# Patient Record
Sex: Male | Born: 1940 | Race: Black or African American | Hispanic: No | Marital: Married | Smoking: Former smoker
Health system: Southern US, Community
[De-identification: ages and names within clinical notes are randomized; demographics above are authoritative.]

## PROBLEM LIST (undated history)

## (undated) ENCOUNTER — Emergency Department (HOSPITAL_COMMUNITY): Discharge status: Against Medical Advice | Payer: Medicare Other

## (undated) DIAGNOSIS — F0390 Unspecified dementia without behavioral disturbance: Secondary | ICD-10-CM

## (undated) DIAGNOSIS — I219 Acute myocardial infarction, unspecified: Secondary | ICD-10-CM

## (undated) DIAGNOSIS — N186 End stage renal disease: Secondary | ICD-10-CM

## (undated) DIAGNOSIS — J90 Pleural effusion, not elsewhere classified: Secondary | ICD-10-CM

## (undated) DIAGNOSIS — A419 Sepsis, unspecified organism: Secondary | ICD-10-CM

## (undated) DIAGNOSIS — I5032 Chronic diastolic (congestive) heart failure: Secondary | ICD-10-CM

## (undated) DIAGNOSIS — N289 Disorder of kidney and ureter, unspecified: Secondary | ICD-10-CM

## (undated) DIAGNOSIS — I10 Essential (primary) hypertension: Secondary | ICD-10-CM

## (undated) DIAGNOSIS — I214 Non-ST elevation (NSTEMI) myocardial infarction: Secondary | ICD-10-CM

## (undated) DIAGNOSIS — A401 Sepsis due to streptococcus, group B: Secondary | ICD-10-CM

## (undated) DIAGNOSIS — R410 Disorientation, unspecified: Secondary | ICD-10-CM

## (undated) DIAGNOSIS — D649 Anemia, unspecified: Secondary | ICD-10-CM

## (undated) DIAGNOSIS — E079 Disorder of thyroid, unspecified: Secondary | ICD-10-CM

## (undated) DIAGNOSIS — I350 Nonrheumatic aortic (valve) stenosis: Secondary | ICD-10-CM

## (undated) DIAGNOSIS — M1990 Unspecified osteoarthritis, unspecified site: Secondary | ICD-10-CM

## (undated) DIAGNOSIS — I2510 Atherosclerotic heart disease of native coronary artery without angina pectoris: Secondary | ICD-10-CM

## (undated) DIAGNOSIS — E119 Type 2 diabetes mellitus without complications: Secondary | ICD-10-CM

## (undated) DIAGNOSIS — E785 Hyperlipidemia, unspecified: Secondary | ICD-10-CM

## (undated) DIAGNOSIS — E8809 Other disorders of plasma-protein metabolism, not elsewhere classified: Secondary | ICD-10-CM

## (undated) DIAGNOSIS — I639 Cerebral infarction, unspecified: Secondary | ICD-10-CM

## (undated) HISTORY — DX: Hyperlipidemia, unspecified: E78.5

## (undated) HISTORY — DX: Sepsis due to Streptococcus, group B: A40.1

## (undated) HISTORY — DX: Sepsis, unspecified organism: A41.9

## (undated) HISTORY — DX: Cerebral infarction, unspecified: I63.9

## (undated) HISTORY — DX: Acute myocardial infarction, unspecified: I21.9

## (undated) HISTORY — DX: Disorder of thyroid, unspecified: E07.9

## (undated) NOTE — *Deleted (*Deleted)
Attending Note  Patient seen and discussed with PA Strader, I agree with her documetation. 39 yo male history of CKD 5, HTN, DM2, HL reported admitted with chest pain and SOB. In ER hypoxic and placed on bipap, found to be hypertensive and started on NG drip. Significant signs of fluid overload, seen by nephrology with plans to start HD as inpatient once access is placed. Left thoracentesis with 1 L removed, started on IV diuretics by neprhology. Initital troponin was mild, hoiwever has trended up to 2891. EKG SR Echo showed LVEF 55-60%, severe LVH. EKG incomplete LBBB that is chronic.  Initially on presentation with fluid overload and woresning renal failure thought mild trop at that time was related to HF. WIth significant uptrend as well as long history of exertional chest pain concern for undelrying ischemic heart disease. He is on medical therapy with ASA 81, atorva 40, hep gtt, lopressor 100mg  bid. No ACE/ARB given renal function though could consider once committted to HD. Once access placed and able to had HD this admission would plan for heart cath.  Acute on chronic diastolic HF in setting of advanced kidney failure. We have deferred diuretic dosing to neprhology, currnetly on IV lasix 120mg  bid. Ultimately volume will be controlled by upcoming HD   Severe HTN in setting of fluid overload, per renal want to avoid aggressive bp lowering to allow optimal perfusion of kidneys. Fairly low dose NG gtt, will ask nursing to titrate more so based on SBP than chest pain, goal SBP <140  Would anticipate cath once stabilized on HD and more euvolemic, potentially Monday.    Tacy Learn MD

---

## 1998-11-13 DIAGNOSIS — I219 Acute myocardial infarction, unspecified: Secondary | ICD-10-CM

## 1998-11-13 HISTORY — DX: Acute myocardial infarction, unspecified: I21.9

## 1998-11-13 HISTORY — PX: CARDIAC CATHETERIZATION: SHX172

## 1998-11-29 DIAGNOSIS — I252 Old myocardial infarction: Secondary | ICD-10-CM | POA: Insufficient documentation

## 2001-02-26 LAB — HM COLONOSCOPY

## 2002-11-01 ENCOUNTER — Emergency Department (HOSPITAL_COMMUNITY): Admission: EM | Admit: 2002-11-01 | Discharge: 2002-11-01 | Payer: Self-pay | Admitting: Emergency Medicine

## 2004-07-19 ENCOUNTER — Inpatient Hospital Stay (HOSPITAL_BASED_OUTPATIENT_CLINIC_OR_DEPARTMENT_OTHER): Admission: RE | Admit: 2004-07-19 | Discharge: 2004-07-19 | Payer: Self-pay | Admitting: Cardiology

## 2009-06-23 ENCOUNTER — Ambulatory Visit: Admission: RE | Admit: 2009-06-23 | Discharge: 2009-06-23 | Payer: Self-pay | Admitting: Family Medicine

## 2009-06-26 ENCOUNTER — Ambulatory Visit: Payer: Self-pay | Admitting: Internal Medicine

## 2011-02-08 DIAGNOSIS — I252 Old myocardial infarction: Secondary | ICD-10-CM

## 2011-02-08 DIAGNOSIS — Z550 Illiteracy and low-level literacy: Secondary | ICD-10-CM | POA: Insufficient documentation

## 2011-02-08 DIAGNOSIS — E785 Hyperlipidemia, unspecified: Secondary | ICD-10-CM | POA: Insufficient documentation

## 2011-02-08 DIAGNOSIS — E039 Hypothyroidism, unspecified: Secondary | ICD-10-CM

## 2011-02-08 DIAGNOSIS — I2510 Atherosclerotic heart disease of native coronary artery without angina pectoris: Secondary | ICD-10-CM | POA: Insufficient documentation

## 2011-02-08 DIAGNOSIS — E119 Type 2 diabetes mellitus without complications: Secondary | ICD-10-CM

## 2011-02-08 DIAGNOSIS — N529 Male erectile dysfunction, unspecified: Secondary | ICD-10-CM

## 2011-02-08 DIAGNOSIS — N189 Chronic kidney disease, unspecified: Secondary | ICD-10-CM

## 2011-02-08 DIAGNOSIS — I10 Essential (primary) hypertension: Secondary | ICD-10-CM

## 2011-02-08 DIAGNOSIS — E1129 Type 2 diabetes mellitus with other diabetic kidney complication: Secondary | ICD-10-CM | POA: Insufficient documentation

## 2011-03-31 NOTE — Procedures (Signed)
NAME:  Michael Trevino, Michael Trevino NO.:  [id number]   MEDICAL RECORD NO.:  [id number]          PATIENT TYPE:  OUT   LOCATION:  SLEEP CENTER                 FACILITY:  [facility]   PHYSICIAN:  Clinton D. Annamaria Boots, MD, FCCP, FACPDATE OF BIRTH:  25-Jul-1941   DATE OF STUDY:  06/25/2009                            NOCTURNAL POLYSOMNOGRAM   REFERRING PHYSICIAN:  Chipper Herb, M.D.   INDICATION FOR STUDY:  Hypersomnia with sleep apnea.   EPWORTH SLEEPINESS SCORE:  12/24, BMI 36.6.  Weight 248 pounds, height  69 inches.  Neck 18.5 inches.   MEDICATIONS:  Home medications are charted and reviewed.   SLEEP ARCHITECTURE:  Total sleep time 107.5 minutes with sleep  efficiency 26.3%.  Stage I was 33.5%, stage II 66.5%, stages III and REM  were absent.  Sleep latency 32 minutes, awake after sleep onset 209  minutes, arousal index 59.7 indicating increased EEG arousal.  No  bedtime medication reported.  The patient had difficulty initiating and  maintaining sleep.   RESPIRATORY DATA:  Apnea-hypopnea index (AHI) 47.4 per hour.  A total of  85 events was scored including 1 obstructive apnea and 84 hypopneas.  Events were non supine.  He achieved insufficient sleep to permit CPAP  titration by split protocol on this study night.   OXYGEN DATA:  Extremely loud snoring with oxygen desaturation to a nadir  of 84%.  Mean oxygen saturation through the study was 91.7% on room air.   CARDIAC DATA:  Sinus rhythm.   MOVEMENT-PARASOMNIA:  No significant movement disturbance.  Bathroom x3.   IMPRESSIONS-RECOMMENDATIONS:  1. Nonspecific difficulty initiating and maintaining sleep with      isolated episodes of sleep still marked by frequent sleep stage      changes.  Reference was made to a past history of stroke and      myocardial infarction in the past.  2. Moderately severe obstructive sleep apnea/hypopnea syndrome, apnea-      hypopnea index 47.4 per hour.  Events were while non supine.  Very    loud snoring with oxygen desaturation to a nadir of 84% on room      air.  3. In light of the patient's combined difficulties initiating and      maintaining sleep, with obstructive sleep apnea,      consider return for continuous positive airway pressure titration      bringing a sleep medication to help consolidate nighttime sleep.      Otherwise, evaluate for alternative management as appropriate.      Clinton D. Annamaria Boots, MD, [facility], FACP  Diplomate, Tax adviser of Sleep Medicine  Electronically Signed     CDY/MEDQ  D:  06/25/2009 15:36:57  T:  06/26/2009 00:43:06  Job:  [id number]

## 2011-03-31 NOTE — Cardiovascular Report (Signed)
NAME:  Michael Trevino, Michael Trevino NO.:  [id number]   MEDICAL RECORD NO.:  [id number]                   PATIENT TYPE:  OIB   LOCATION:  6501                                 FACILITY:  [facility]   PHYSICIAN:  Minus Breeding, M.D.                DATE OF BIRTH:  [birth year]   DATE OF PROCEDURE:  07/19/2004  DATE OF DISCHARGE:                              CARDIAC CATHETERIZATION   PRIMARY CARE:  Osa Craver, P.A., [facility].   INDICATIONS:  Evaluate patient with an abnormal Cardiolite suggesting  inferior infarct with ischemia.   PROCEDURE:  Left heart catheterization, selective coronary angiography   The left heart catheterization was performed via the right femoral artery.  The artery was cannulated using anterior wall puncture.  A #4 French  anterior sheath was inserted via the modified Seldinger technique.  Pre-  formed Judkins and a pigtail catheter were utilized.  Note:  The patient ws given several hours of hydration prior to this  procedure because of his renal insufficiency. We limited the dye as well.   RESULTS:  Hemodynamics:  LV 131/8, aorta 124/62.  Coronaries:  Left main was  normal.  The LAD had mild diffuse disease.  There were luminal  irregularities.  The first diagonal was large and normal.  The second  diagonal filled faintly and was either occluded or subtotally occluded  proximally, filling out over bridging collaterals, there was scant antegrade  flow.  Circumflex in the AV groove was normal.  There was a large OM1 which  was normal, an OM2 was large and normal. The right coronary artery was a  dominant vessel.  It was occluded proximally.   LEFT VENTRICULOGRAM:  The left ventriculogram was non-injected secondary to  renal insufficiency.   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   PLAN:  The patient will have medical management with risk  reduction.                                               Minus Breeding, M.D.    JH/MEDQ  D:  07/19/2004  T:  07/19/2004  Job:  [id number]   cc:   Osa Craver, P.A.

## 2011-04-06 ENCOUNTER — Encounter: Payer: Self-pay | Admitting: Physician Assistant

## 2011-04-06 LAB — HEMOGLOBIN A1C: Hgb A1c MFr Bld: 7.7 % — AB (ref 4.0–6.0)

## 2012-05-30 ENCOUNTER — Ambulatory Visit: Payer: Medicare Other | Attending: Family Medicine | Admitting: Physical Therapy

## 2012-05-30 DIAGNOSIS — IMO0001 Reserved for inherently not codable concepts without codable children: Secondary | ICD-10-CM | POA: Insufficient documentation

## 2012-05-30 DIAGNOSIS — R5381 Other malaise: Secondary | ICD-10-CM | POA: Insufficient documentation

## 2012-05-30 DIAGNOSIS — R293 Abnormal posture: Secondary | ICD-10-CM | POA: Insufficient documentation

## 2012-05-30 DIAGNOSIS — M545 Low back pain, unspecified: Secondary | ICD-10-CM | POA: Insufficient documentation

## 2012-06-06 ENCOUNTER — Ambulatory Visit: Payer: Medicare Other | Admitting: Physical Therapy

## 2012-06-13 ENCOUNTER — Ambulatory Visit: Payer: Medicare Other | Attending: Family Medicine | Admitting: Physical Therapy

## 2012-06-13 DIAGNOSIS — R293 Abnormal posture: Secondary | ICD-10-CM | POA: Insufficient documentation

## 2012-06-13 DIAGNOSIS — M545 Low back pain, unspecified: Secondary | ICD-10-CM | POA: Insufficient documentation

## 2012-06-13 DIAGNOSIS — M2569 Stiffness of other specified joint, not elsewhere classified: Secondary | ICD-10-CM | POA: Insufficient documentation

## 2012-06-13 DIAGNOSIS — R5381 Other malaise: Secondary | ICD-10-CM | POA: Insufficient documentation

## 2012-06-13 DIAGNOSIS — IMO0001 Reserved for inherently not codable concepts without codable children: Secondary | ICD-10-CM | POA: Insufficient documentation

## 2012-06-20 ENCOUNTER — Ambulatory Visit: Payer: Medicare Other | Admitting: Physical Therapy

## 2012-06-27 ENCOUNTER — Ambulatory Visit: Payer: Medicare Other | Admitting: *Deleted

## 2012-07-02 ENCOUNTER — Ambulatory Visit: Payer: Medicare Other | Admitting: Physical Therapy

## 2012-07-04 ENCOUNTER — Ambulatory Visit: Payer: Medicare Other | Admitting: Physical Therapy

## 2012-07-09 ENCOUNTER — Ambulatory Visit: Payer: Medicare Other | Admitting: Physical Therapy

## 2012-07-11 ENCOUNTER — Ambulatory Visit: Payer: Medicare Other | Admitting: Physical Therapy

## 2012-07-16 ENCOUNTER — Ambulatory Visit: Payer: Medicare Other | Attending: Family Medicine | Admitting: Physical Therapy

## 2012-07-16 DIAGNOSIS — M545 Low back pain, unspecified: Secondary | ICD-10-CM | POA: Insufficient documentation

## 2012-07-16 DIAGNOSIS — M2569 Stiffness of other specified joint, not elsewhere classified: Secondary | ICD-10-CM | POA: Insufficient documentation

## 2012-07-16 DIAGNOSIS — IMO0001 Reserved for inherently not codable concepts without codable children: Secondary | ICD-10-CM | POA: Insufficient documentation

## 2012-07-16 DIAGNOSIS — R5381 Other malaise: Secondary | ICD-10-CM | POA: Insufficient documentation

## 2012-07-16 DIAGNOSIS — R293 Abnormal posture: Secondary | ICD-10-CM | POA: Insufficient documentation

## 2012-07-18 ENCOUNTER — Ambulatory Visit: Payer: Medicare Other | Admitting: Physical Therapy

## 2012-07-25 ENCOUNTER — Ambulatory Visit: Payer: Medicare Other | Admitting: Physical Therapy

## 2012-11-04 ENCOUNTER — Emergency Department (HOSPITAL_COMMUNITY): Payer: Medicare Other

## 2012-11-04 ENCOUNTER — Encounter (HOSPITAL_COMMUNITY): Payer: Self-pay | Admitting: Emergency Medicine

## 2012-11-04 ENCOUNTER — Observation Stay (HOSPITAL_COMMUNITY)
Admission: EM | Admit: 2012-11-04 | Discharge: 2012-11-06 | Disposition: A | Discharge status: Home / Self Care | Payer: Medicare Other | Attending: Internal Medicine | Admitting: Internal Medicine

## 2012-11-04 DIAGNOSIS — L0390 Cellulitis, unspecified: Secondary | ICD-10-CM | POA: Diagnosis present

## 2012-11-04 DIAGNOSIS — R509 Fever, unspecified: Secondary | ICD-10-CM | POA: Diagnosis present

## 2012-11-04 DIAGNOSIS — IMO0002 Reserved for concepts with insufficient information to code with codable children: Secondary | ICD-10-CM | POA: Diagnosis present

## 2012-11-04 DIAGNOSIS — R5383 Other fatigue: Secondary | ICD-10-CM | POA: Insufficient documentation

## 2012-11-04 DIAGNOSIS — E1129 Type 2 diabetes mellitus with other diabetic kidney complication: Secondary | ICD-10-CM | POA: Diagnosis present

## 2012-11-04 DIAGNOSIS — R21 Rash and other nonspecific skin eruption: Secondary | ICD-10-CM | POA: Diagnosis present

## 2012-11-04 DIAGNOSIS — L03319 Cellulitis of trunk, unspecified: Secondary | ICD-10-CM

## 2012-11-04 DIAGNOSIS — R5381 Other malaise: Secondary | ICD-10-CM | POA: Insufficient documentation

## 2012-11-04 DIAGNOSIS — I2510 Atherosclerotic heart disease of native coronary artery without angina pectoris: Secondary | ICD-10-CM

## 2012-11-04 DIAGNOSIS — I129 Hypertensive chronic kidney disease with stage 1 through stage 4 chronic kidney disease, or unspecified chronic kidney disease: Secondary | ICD-10-CM | POA: Insufficient documentation

## 2012-11-04 DIAGNOSIS — Z9181 History of falling: Secondary | ICD-10-CM | POA: Insufficient documentation

## 2012-11-04 DIAGNOSIS — I10 Essential (primary) hypertension: Secondary | ICD-10-CM

## 2012-11-04 DIAGNOSIS — N189 Chronic kidney disease, unspecified: Secondary | ICD-10-CM

## 2012-11-04 DIAGNOSIS — L02219 Cutaneous abscess of trunk, unspecified: Secondary | ICD-10-CM

## 2012-11-04 DIAGNOSIS — E119 Type 2 diabetes mellitus without complications: Secondary | ICD-10-CM

## 2012-11-04 DIAGNOSIS — L03311 Cellulitis of abdominal wall: Secondary | ICD-10-CM

## 2012-11-04 DIAGNOSIS — N183 Chronic kidney disease, stage 3 unspecified: Secondary | ICD-10-CM | POA: Insufficient documentation

## 2012-11-04 DIAGNOSIS — N289 Disorder of kidney and ureter, unspecified: Secondary | ICD-10-CM

## 2012-11-04 DIAGNOSIS — Z79899 Other long term (current) drug therapy: Secondary | ICD-10-CM | POA: Insufficient documentation

## 2012-11-04 HISTORY — DX: Unspecified osteoarthritis, unspecified site: M19.90

## 2012-11-04 HISTORY — DX: Acute myocardial infarction, unspecified: I21.9

## 2012-11-04 HISTORY — DX: Type 2 diabetes mellitus without complications: E11.9

## 2012-11-04 HISTORY — DX: Essential (primary) hypertension: I10

## 2012-11-04 LAB — CBC WITH DIFFERENTIAL/PLATELET
Basophils Absolute: 0 10*3/uL (ref 0.0–0.1)
Basophils Relative: 0 % (ref 0–1)
Eosinophils Absolute: 0 10*3/uL (ref 0.0–0.7)
Eosinophils Relative: 0 % (ref 0–5)
HCT: 44 % (ref 39.0–52.0)
Hemoglobin: 14.8 g/dL (ref 13.0–17.0)
Lymphocytes Relative: 10 % — ABNORMAL LOW (ref 12–46)
Lymphs Abs: 1 10*3/uL (ref 0.7–4.0)
MCH: 29.2 pg (ref 26.0–34.0)
MCHC: 33.6 g/dL (ref 30.0–36.0)
MCV: 86.8 fL (ref 78.0–100.0)
Monocytes Absolute: 0.7 10*3/uL (ref 0.1–1.0)
Monocytes Relative: 7 % (ref 3–12)
Neutro Abs: 8.4 10*3/uL — ABNORMAL HIGH (ref 1.7–7.7)
Neutrophils Relative %: 83 % — ABNORMAL HIGH (ref 43–77)
Platelets: 135 10*3/uL — ABNORMAL LOW (ref 150–400)
RBC: 5.07 MIL/uL (ref 4.22–5.81)
RDW: 13.9 % (ref 11.5–15.5)
WBC: 10.1 10*3/uL (ref 4.0–10.5)

## 2012-11-04 LAB — COMPREHENSIVE METABOLIC PANEL
ALT: 18 U/L (ref 0–53)
AST: 19 U/L (ref 0–37)
Albumin: 3.5 g/dL (ref 3.5–5.2)
Alkaline Phosphatase: 92 U/L (ref 39–117)
BUN: 18 mg/dL (ref 6–23)
CO2: 22 mEq/L (ref 19–32)
Calcium: 9.1 mg/dL (ref 8.4–10.5)
Chloride: 104 mEq/L (ref 96–112)
Creatinine, Ser: 1.52 mg/dL — ABNORMAL HIGH (ref 0.50–1.35)
GFR calc Af Amer: 51 mL/min — ABNORMAL LOW (ref >90)
GFR calc non Af Amer: 44 mL/min — ABNORMAL LOW (ref >90)
Glucose, Bld: 215 mg/dL — ABNORMAL HIGH (ref 70–99)
Potassium: 3.6 mEq/L (ref 3.5–5.1)
Sodium: 139 mEq/L (ref 135–145)
Total Bilirubin: 0.5 mg/dL (ref 0.3–1.2)
Total Protein: 7.5 g/dL (ref 6.0–8.3)

## 2012-11-04 LAB — URINALYSIS, ROUTINE W REFLEX MICROSCOPIC
Bilirubin Urine: NEGATIVE
Glucose, UA: >1000 mg/dL — AB
Ketones, ur: 15 mg/dL — AB
Leukocytes, UA: NEGATIVE
Nitrite: NEGATIVE
Protein, ur: >300 mg/dL — AB
Specific Gravity, Urine: 1.026 (ref 1.005–1.030)
Urobilinogen, UA: 0.2 mg/dL (ref 0.0–1.0)
pH: 5.5 (ref 5.0–8.0)

## 2012-11-04 LAB — TROPONIN I: Troponin I: <0.3 ng/mL (ref <0.30)

## 2012-11-04 LAB — CK: Total CK: 165 U/L (ref 7–232)

## 2012-11-04 LAB — URINE MICROSCOPIC-ADD ON

## 2012-11-04 LAB — GLUCOSE, CAPILLARY: Glucose-Capillary: 204 mg/dL — ABNORMAL HIGH (ref 70–99)

## 2012-11-04 LAB — CG4 I-STAT (LACTIC ACID): Lactic Acid, Venous: 2.41 mmol/L — ABNORMAL HIGH (ref 0.5–2.2)

## 2012-11-04 MED ORDER — BENAZEPRIL HCL 40 MG PO TABS
40.0000 mg | ORAL_TABLET | Freq: Every day | ORAL | Status: DC
  Administered 2012-11-05 – 2012-11-06 (×2): 40 mg via ORAL
  Filled 2012-11-04 (×2): qty 1

## 2012-11-04 MED ORDER — INSULIN ASPART 100 UNIT/ML ~~LOC~~ SOLN
0.0000 [IU] | Freq: Three times a day (TID) | SUBCUTANEOUS | Status: DC
  Administered 2012-11-05 – 2012-11-06 (×4): 3 [IU] via SUBCUTANEOUS

## 2012-11-04 MED ORDER — CEFAZOLIN SODIUM 1-5 GM-% IV SOLN
1.0000 g | Freq: Once | INTRAVENOUS | Status: AC
  Administered 2012-11-04: 1 g via INTRAVENOUS
  Filled 2012-11-04: qty 50

## 2012-11-04 MED ORDER — INSULIN GLARGINE 100 UNIT/ML ~~LOC~~ SOLN
40.0000 [IU] | Freq: Every day | SUBCUTANEOUS | Status: DC
  Administered 2012-11-05 – 2012-11-06 (×2): 40 [IU] via SUBCUTANEOUS

## 2012-11-04 MED ORDER — SODIUM CHLORIDE 0.9 % IV SOLN
Freq: Once | INTRAVENOUS | Status: AC
  Administered 2012-11-04: 21:00:00 via INTRAVENOUS

## 2012-11-04 MED ORDER — CEFAZOLIN SODIUM 1-5 GM-% IV SOLN
1.0000 g | Freq: Three times a day (TID) | INTRAVENOUS | Status: DC
  Administered 2012-11-05 – 2012-11-06 (×5): 1 g via INTRAVENOUS
  Filled 2012-11-04 (×7): qty 50

## 2012-11-04 MED ORDER — VITAMIN E 180 MG (400 UNIT) PO CAPS
400.0000 [IU] | ORAL_CAPSULE | Freq: Every day | ORAL | Status: DC
  Administered 2012-11-05 – 2012-11-06 (×2): 400 [IU] via ORAL
  Filled 2012-11-04 (×2): qty 1

## 2012-11-04 MED ORDER — NIACIN ER 500 MG PO CPCR
1000.0000 mg | ORAL_CAPSULE | Freq: Every day | ORAL | Status: DC
  Administered 2012-11-05: 1000 mg via ORAL
  Filled 2012-11-04 (×3): qty 2

## 2012-11-04 MED ORDER — FUROSEMIDE 40 MG PO TABS
40.0000 mg | ORAL_TABLET | Freq: Two times a day (BID) | ORAL | Status: DC
  Filled 2012-11-04 (×3): qty 1

## 2012-11-04 MED ORDER — AMLODIPINE BESYLATE 10 MG PO TABS
10.0000 mg | ORAL_TABLET | Freq: Every day | ORAL | Status: DC
  Administered 2012-11-05 – 2012-11-06 (×2): 10 mg via ORAL
  Filled 2012-11-04 (×2): qty 1

## 2012-11-04 MED ORDER — HEPARIN SODIUM (PORCINE) 5000 UNIT/ML IJ SOLN
5000.0000 [IU] | Freq: Three times a day (TID) | INTRAMUSCULAR | Status: DC
  Administered 2012-11-05 – 2012-11-06 (×5): 5000 [IU] via SUBCUTANEOUS
  Filled 2012-11-04 (×7): qty 1

## 2012-11-04 MED ORDER — METOPROLOL TARTRATE 100 MG PO TABS
100.0000 mg | ORAL_TABLET | Freq: Two times a day (BID) | ORAL | Status: DC
  Administered 2012-11-05 – 2012-11-06 (×3): 100 mg via ORAL
  Filled 2012-11-04 (×5): qty 1

## 2012-11-04 MED ORDER — VITAMIN C 500 MG PO TABS
500.0000 mg | ORAL_TABLET | Freq: Every day | ORAL | Status: DC
  Administered 2012-11-05 – 2012-11-06 (×2): 500 mg via ORAL
  Filled 2012-11-04 (×2): qty 1

## 2012-11-04 MED ORDER — ASPIRIN 81 MG PO CHEW
81.0000 mg | CHEWABLE_TABLET | Freq: Every day | ORAL | Status: DC
  Administered 2012-11-05 – 2012-11-06 (×2): 81 mg via ORAL
  Filled 2012-11-04 (×2): qty 1

## 2012-11-04 MED ORDER — LEVOTHYROXINE SODIUM 100 MCG PO TABS
100.0000 ug | ORAL_TABLET | Freq: Every day | ORAL | Status: DC
  Administered 2012-11-05 – 2012-11-06 (×2): 100 ug via ORAL
  Filled 2012-11-04 (×3): qty 1

## 2012-11-04 MED ORDER — SODIUM CHLORIDE 0.9 % IV BOLUS (SEPSIS)
1000.0000 mL | Freq: Once | INTRAVENOUS | Status: AC
  Administered 2012-11-04: 1000 mL via INTRAVENOUS

## 2012-11-04 MED ORDER — ROSUVASTATIN CALCIUM 20 MG PO TABS
20.0000 mg | ORAL_TABLET | Freq: Every day | ORAL | Status: DC
  Administered 2012-11-05: 20 mg via ORAL
  Filled 2012-11-04 (×2): qty 1

## 2012-11-04 NOTE — ED Notes (Signed)
Attempted to call report. Floor RN unable to accept report.  

## 2012-11-04 NOTE — ED Notes (Signed)
Pt's CBG 204 RN notified

## 2012-11-04 NOTE — H&P (Signed)
[facility] History and Physical  Michael Trevino [id number] DOB: [birth year] DOA: 11/04/2012  Referring physician: ED PCP: Redge Gainer, MD  Specialists: None  Chief Complaint: Weakness, fall  HPI: Michael Trevino is a 71 y.o. male who presents with c/o fever, chills, generalized weakness, and a fall earlier this evening.  Symptoms onset earlier today, and are associated with an erythematous rash on his abdomen.  Of note he takes his daily Lantus shots in his abdomen area.  The rash is non painful and spans across his abdomen, he is unsure wether the rash was present before the fall or not.  In the ED he was noted to be running a temperature of 100.3, started on ancef for his cellulitis, hospitalist has been asked to admit.  Review of Systems: 12 systems reviewed and negative.  Past Medical History  Diagnosis Date  . Arthritis   . Diabetes mellitus without complication   . Hypertension   . MI (myocardial infarction) 2000   History reviewed. No pertinent past surgical history. Social History:  does not have a smoking history on file. He does not have any smokeless tobacco history on file. His alcohol and drug histories not on file.  Allergies  Allergen Reactions  . Zocor (Simvastatin - High Dose)     Unknown    History reviewed. No pertinent family history.   Prior to Admission medications   Medication Sig Start Date End Date Taking? Authorizing Provider  amLODipine (NORVASC) 10 MG tablet Take 10 mg by mouth daily.     Yes Historical Provider, MD  ascorbic acid (VITAMIN C) 500 MG tablet Take 500 mg by mouth daily.   Yes Historical Provider, MD  aspirin 81 MG tablet Take 81 mg by mouth daily.     Yes Historical Provider, MD  benazepril (LOTENSIN) 40 MG tablet Take 40 mg by mouth daily.     Yes Historical Provider, MD  furosemide (LASIX) 40 MG tablet Take 40 mg by mouth 2 (two) times daily.     Yes Historical Provider, MD  glimepiride (AMARYL) 4 MG tablet Take 4 mg by mouth  2 (two) times daily.     Yes Historical Provider, MD  insulin glargine (LANTUS) 100 UNIT/ML injection Inject 40 Units into the skin daily.     Yes Historical Provider, MD  levothyroxine (SYNTHROID, LEVOTHROID) 100 MCG tablet Take 100 mcg by mouth daily.     Yes Historical Provider, MD  metoprolol (LOPRESSOR) 100 MG tablet Take 100 mg by mouth 2 (two) times daily.     Yes Historical Provider, MD  niacin (NIASPAN) 1000 MG CR tablet Take 1,000 mg by mouth at bedtime.     Yes Historical Provider, MD  pioglitazone (ACTOS) 30 MG tablet Take 30 mg by mouth daily.     Yes Historical Provider, MD  rosuvastatin (CRESTOR) 20 MG tablet Take 20 mg by mouth at bedtime.     Yes Historical Provider, MD  vitamin E 400 UNIT capsule Take 400 Units by mouth daily.   Yes Historical Provider, MD   Physical Exam: Filed Vitals:   11/04/12 1805 11/04/12 1815 11/04/12 1830 11/04/12 2329  BP: 127/58 134/52 127/58 121/59  Pulse: 101 99 101 96  Temp: 99.5 F (37.5 C)   100.3 F (37.9 C)  TempSrc: Oral   Oral  Resp: 19 23 22 31   SpO2: 93% 97% 96% 95%    General:  NAD, resting comfortably in bed Eyes: PEERLA EOMI ENT: mucous membranes moist Neck: supple w/o JVD  Cardiovascular: RRR w/o MRG Respiratory: CTA B Abdomen: soft, nt, nd, bs+ Skin: erythematous rash across his abdomen with areas of induration Musculoskeletal: MAE, full ROM all 4 extremities Psychiatric: normal tone and affect Neurologic: AAOx3, grossly non-focal  Labs on Admission:  Basic Metabolic Panel:  Lab 0000000 1818  NA 139  K 3.6  CL 104  CO2 22  GLUCOSE 215*  BUN 18  CREATININE 1.52*  CALCIUM 9.1  MG --  PHOS --   Liver Function Tests:  Lab 11/04/12 1818  AST 19  ALT 18  ALKPHOS 92  BILITOT 0.5  PROT 7.5  ALBUMIN 3.5   No results found for this basename: LIPASE:5,AMYLASE:5 in the last 168 hours No results found for this basename: AMMONIA:5 in the last 168 hours CBC:  Lab 11/04/12 1818  WBC 10.1  NEUTROABS 8.4*   HGB 14.8  HCT 44.0  MCV 86.8  PLT 135*   Cardiac Enzymes:  Lab 11/04/12 1913 11/04/12 1824  CKTOTAL 165 --  CKMB -- --  CKMBINDEX -- --  TROPONINI -- <0.30    BNP (last 3 results) No results found for this basename: PROBNP:3 in the last 8760 hours CBG:  Lab 11/04/12 2126  GLUCAP 204*    Radiological Exams on Admission: Dg Chest Portable 1 View  11/04/2012  *RADIOLOGY REPORT*  Clinical Data: Fall with weakness.  History of   hypertension.  PORTABLE CHEST - 1 VIEW  Comparison: None.  Findings: The heart is enlarged.  Aorta calcified and tortuous.  No infiltrates or failure.  No effusion or pneumothorax.  Osseous structures unremarkable.  IMPRESSION: Cardiomegaly.  No active infiltrates or failure.   Original Report Authenticated By: Rolla Flatten, M.D.     EKG: Independently reviewed.  Assessment/Plan Principal Problem:  *Cellulitis Active Problems:  DM (diabetes mellitus)   1. Cellulitis - rash across abdomen suspicious for cellulitis given areas of induration and fact that he uses insulin injections in that exact area on a daily basis.  Will admit to obs given the weakness and fall, treat with ancef for now. 2. DM2 - continue home lantus, holding PO meds and will put him on SSI while here. 3. HTN - continue home meds.    Code Status: Full Code (must indicate code status--if unknown or must be presumed, indicate so) Family Communication: spoke with son and wife at bedside (indicate person spoken with, if applicable, with phone number if by telephone) Disposition Plan: Admit to obs (indicate anticipated LOS)  Time spent: 50 min  Mozes Sagar M. [facility] Pager [phone]  If 7PM-7AM, please contact night-coverage [email or web address] Password [facility] 11/04/2012, 11:49 PM

## 2012-11-04 NOTE — ED Notes (Addendum)
Patient here for fall that his neighbor found him down around 2pm. He c/o weaknes, dizziness, no energy. Reports he wet himself x3 today because he does not have energy to go bathroom. Pt thinks he has pin nerve because his left leg go numbs sometimes. Vital per EMS BP 132/87, pulse 103, RR 16, O2 96% on room air.

## 2012-11-04 NOTE — ED Provider Notes (Signed)
History     CSN: [id number]  Arrival date & time 11/04/12  60   First MD Initiated Contact with Patient 11/04/12 1814      Chief Complaint  Patient presents with  . Fall  . Weakness    (Consider location/radiation/quality/duration/timing/severity/associated sxs/prior treatment) Patient is a 71 y.o. male presenting with fall and weakness. The history is provided by the patient.  Fall  Weakness  Additional symptoms include weakness.  He says that he felt fine yesterday. Today, he went into the bathroom and fell and could not get back up. He lay on the floor for several hours and then she urinated on the floor because he could not get up to get to the commode. He denies any pain anywhere. He denies chest pain, abdominal pain. He denies nausea or vomiting or diarrhea. He did not have any fecal incontinence. There's been no fever, chills, sweats. He came in by ambulance.  Past Medical History  Diagnosis Date  . Arthritis   . Diabetes mellitus without complication   . Hypertension   . MI (myocardial infarction) 2000    No past surgical history on file.  No family history on file.  History  Substance Use Topics  . Smoking status: Not on file  . Smokeless tobacco: Not on file  . Alcohol Use:       Review of Systems  Neurological: Positive for weakness.  All other systems reviewed and are negative.    Allergies  Zocor  Home Medications   Current Outpatient Rx  Name  Route  Sig  Dispense  Refill  . AMLODIPINE BESYLATE 10 MG PO TABS   Oral   Take 10 mg by mouth daily.           . ASPIRIN 81 MG PO TABS   Oral   Take 81 mg by mouth daily.           Marland Kitchen BENAZEPRIL HCL 40 MG PO TABS   Oral   Take 40 mg by mouth daily.           Marland Kitchen EZETIMIBE 10 MG PO TABS   Oral   Take 10 mg by mouth daily.           . FUROSEMIDE 40 MG PO TABS   Oral   Take 40 mg by mouth 2 (two) times daily.           Marland Kitchen GLIMEPIRIDE 4 MG PO TABS   Oral   Take 4 mg by mouth 2  (two) times daily.           . INSULIN GLARGINE 100 UNIT/ML  SOLN   Subcutaneous   Inject 40 Units into the skin daily.           Marland Kitchen LEVOTHYROXINE SODIUM 100 MCG PO TABS   Oral   Take 100 mcg by mouth daily.           Marland Kitchen METOPROLOL TARTRATE 100 MG PO TABS   Oral   Take 100 mg by mouth 2 (two) times daily.           Marland Kitchen NIACIN ER (ANTIHYPERLIPIDEMIC) 1000 MG PO TBCR   Oral   Take 1,000 mg by mouth at bedtime.           Marland Kitchen PIOGLITAZONE HCL 30 MG PO TABS   Oral   Take 30 mg by mouth daily.           Marland Kitchen ROSUVASTATIN CALCIUM 20 MG PO TABS  Oral   Take 20 mg by mouth at bedtime.             BP 127/58  Pulse 101  Temp 99.5 F (37.5 C) (Oral)  Resp 19  SpO2 93%  Physical Exam  Nursing note and vitals reviewed. 71 year old male, resting comfortably and in no acute distress. Vital signs are significant for borderline tachycardia with heart rate of 101. Oxygen saturation is 93%, which is normal. Head is normocephalic and atraumatic. PERRLA, EOMI. Oropharynx is clear. Neck is nontender and supple without adenopathy or JVD. Back is nontender and there is no CVA tenderness. Lungs are clear without rales, wheezes, or rhonchi. Chest is nontender. Heart has regular rate and rhythm without murmur. Abdomen is soft, flat, nontender without masses or hepatosplenomegaly and peristalsis is normoactive. Extremities have no cyanosis, full range of motion is present. Trace edema is present. Skin shows an area of erythema over the abdomen. This area is not tender, but it is slightly warm to the touch and is suspicious for either early cellulitis, or possibly erysipelas.. Neurologic: Mental status is normal, cranial nerves are intact, there are no motor or sensory deficits.    ED Course  Procedures (including critical care time)   Labs Reviewed  CBC WITH DIFFERENTIAL  COMPREHENSIVE METABOLIC PANEL  TROPONIN I  URINALYSIS, ROUTINE W REFLEX MICROSCOPIC   No results  found.   Date: 11/04/2012  Rate: 102  Rhythm: sinus tachycardia  QRS Axis: left  Intervals: normal  ST/T Wave abnormalities: nonspecific T wave changes  Conduction Disutrbances:left anterior fascicular block and Incomplete right bundle-branch block  Narrative Interpretation: Left atrial hypertrophy, left ventricular hypertrophy, incomplete right bundle-branch block, left anterior fascicular block, repolarization changes secondary to LVH. No prior ECG available for comparison.  Old EKG Reviewed: none available    1. Cellulitis of abdominal wall   2. Renal insufficiency       MDM  Severe weakness which most likely is related to apparent area of cellulitis of his abdomen. It is also noted that he is on a statin so he could have rhabdomyolysis related to the statin. Laboratory workup has been initiated, he'll be given IV hydration and CK level checked and he will be given a dose of Ancef.  Workup is remarkable for mildly elevated creatinine of 1.5, and borderline elevated lactic acid of 2.4. CK is come back normal. WBC is normal, but with a left shift. Case is been discussed with Dr. Alcario Drought of [facility] who agrees to admit the patient.     Delora Fuel, MD [phone]

## 2012-11-05 ENCOUNTER — Encounter (HOSPITAL_COMMUNITY): Payer: Self-pay

## 2012-11-05 DIAGNOSIS — N189 Chronic kidney disease, unspecified: Secondary | ICD-10-CM

## 2012-11-05 DIAGNOSIS — R21 Rash and other nonspecific skin eruption: Principal | ICD-10-CM

## 2012-11-05 DIAGNOSIS — L0390 Cellulitis, unspecified: Secondary | ICD-10-CM | POA: Diagnosis present

## 2012-11-05 DIAGNOSIS — E119 Type 2 diabetes mellitus without complications: Secondary | ICD-10-CM

## 2012-11-05 DIAGNOSIS — R509 Fever, unspecified: Secondary | ICD-10-CM

## 2012-11-05 LAB — CBC
HCT: 37.5 % — ABNORMAL LOW (ref 39.0–52.0)
Hemoglobin: 13.1 g/dL (ref 13.0–17.0)
MCH: 30 pg (ref 26.0–34.0)
MCHC: 34.9 g/dL (ref 30.0–36.0)
MCV: 85.8 fL (ref 78.0–100.0)
Platelets: 128 10*3/uL — ABNORMAL LOW (ref 150–400)
RBC: 4.37 MIL/uL (ref 4.22–5.81)
RDW: 14.1 % (ref 11.5–15.5)
WBC: 12.5 10*3/uL — ABNORMAL HIGH (ref 4.0–10.5)

## 2012-11-05 LAB — GLUCOSE, CAPILLARY
Glucose-Capillary: 174 mg/dL — ABNORMAL HIGH (ref 70–99)
Glucose-Capillary: 183 mg/dL — ABNORMAL HIGH (ref 70–99)
Glucose-Capillary: 193 mg/dL — ABNORMAL HIGH (ref 70–99)
Glucose-Capillary: 208 mg/dL — ABNORMAL HIGH (ref 70–99)
Glucose-Capillary: 219 mg/dL — ABNORMAL HIGH (ref 70–99)

## 2012-11-05 LAB — BASIC METABOLIC PANEL
BUN: 21 mg/dL (ref 6–23)
CO2: 22 mEq/L (ref 19–32)
Calcium: 8.6 mg/dL (ref 8.4–10.5)
Chloride: 107 mEq/L (ref 96–112)
Creatinine, Ser: 1.67 mg/dL — ABNORMAL HIGH (ref 0.50–1.35)
GFR calc Af Amer: 46 mL/min — ABNORMAL LOW (ref >90)
GFR calc non Af Amer: 40 mL/min — ABNORMAL LOW (ref >90)
Glucose, Bld: 190 mg/dL — ABNORMAL HIGH (ref 70–99)
Potassium: 3.4 mEq/L — ABNORMAL LOW (ref 3.5–5.1)
Sodium: 139 mEq/L (ref 135–145)

## 2012-11-05 LAB — HEMOGLOBIN A1C
Hgb A1c MFr Bld: 10.4 % — ABNORMAL HIGH (ref <5.7)
Mean Plasma Glucose: 252 mg/dL — ABNORMAL HIGH (ref <117)

## 2012-11-05 MED ORDER — SODIUM CHLORIDE 0.9 % IV SOLN
Freq: Once | INTRAVENOUS | Status: AC
  Administered 2012-11-05: 200 mL via INTRAVENOUS

## 2012-11-05 MED ORDER — ONDANSETRON HCL 4 MG/2ML IJ SOLN: 4.0000 mg | Freq: Three times a day (TID) | INTRAMUSCULAR | Status: AC | PRN

## 2012-11-05 NOTE — Progress Notes (Signed)
Utilization review completed.  

## 2012-11-05 NOTE — Progress Notes (Addendum)
[facility]             Progress Note   Subjective: starting to feel better, fever, chills down  Objective: Vital signs in last 24 hours: Temp:  [98.3 F (36.8 C)-99.2 F (37.3 C)] 99 F (37.2 C) (12/24 2151) Pulse Rate:  [75-91] 87  (12/24 2151) Resp:  [20-28] 22  (12/24 2151) BP: (117-140)/(52-64) 140/64 mmHg (12/24 2151) SpO2:  [95 %-98 %] 95 % (12/24 2151) Weight:  [113.1 kg (249 lb 5.4 oz)] 113.1 kg (249 lb 5.4 oz) (12/24 2151) Weight change: 1.2 kg (2 lb 10.3 oz) Last BM Date: 11/04/12  Intake/Output from previous day: 12/24 0701 - 12/25 0700 In: 600 [P.O.:600] Out: 322 [Urine:322] Total I/O In: -  Out: 200 [Urine:200]   Physical Exam: General: Alert, awake, oriented x3, in no acute distress. HEENT: No bruits, no goiter. Heart: Regular rate and rhythm, without murmurs, rubs, gallops. Lungs: Clear to auscultation bilaterally. Abdomen: Soft, nontender, nondistended, positive bowel sounds, erythematous rash involving lower abdomen and back. Extremities: No clubbing cyanosis or edema with positive pedal pulses. Neuro: Grossly intact, nonfocal.    Lab Results: Basic Metabolic Panel:  Basename 11/05/12 0500 11/04/12 1818  NA 139 139  K 3.4* 3.6  CL 107 104  CO2 22 22  GLUCOSE 190* 215*  BUN 21 18  CREATININE 1.67* 1.52*  CALCIUM 8.6 9.1  MG -- --  PHOS -- --   Liver Function Tests:  [facility] 11/04/12 1818  AST 19  ALT 18  ALKPHOS 92  BILITOT 0.5  PROT 7.5  ALBUMIN 3.5   No results found for this basename: LIPASE:2,AMYLASE:2 in the last 72 hours No results found for this basename: AMMONIA:2 in the last 72 hours CBC:  Basename 11/05/12 0500 11/04/12 1818  WBC 12.5* 10.1  NEUTROABS -- 8.4*  HGB 13.1 14.8  HCT 37.5* 44.0  MCV 85.8 86.8  PLT 128* 135*   Cardiac Enzymes:  Basename 11/04/12 1913 11/04/12 1824  CKTOTAL 165 --  CKMB -- --  CKMBINDEX -- --  TROPONINI -- <0.30   BNP: No results found for this basename:  PROBNP:3 in the last 72 hours D-Dimer: No results found for this basename: DDIMER:2 in the last 72 hours CBG:  Basename 11/05/12 2156 11/05/12 1612 11/05/12 1142 11/05/12 0813 11/05/12 0013 11/04/12 2126  GLUCAP 208* 183* 193* 174* 219* 204*   Hemoglobin A1C:  Basename 11/05/12 0500  HGBA1C 10.4*   Fasting Lipid Panel: No results found for this basename: CHOL,HDL,LDLCALC,TRIG,CHOLHDL,LDLDIRECT in the last 72 hours Thyroid Function Tests: No results found for this basename: TSH,T4TOTAL,FREET4,T3FREE,THYROIDAB in the last 72 hours Anemia Panel: No results found for this basename: VITAMINB12,FOLATE,FERRITIN,TIBC,IRON,RETICCTPCT in the last 72 hours Coagulation: No results found for this basename: LABPROT:2,INR:2 in the last 72 hours Urine Drug Screen: Drugs of Abuse  No results found for this basename: labopia, cocainscrnur, labbenz, amphetmu, thcu, labbarb    Alcohol Level: No results found for this basename: ETH:2 in the last 72 hours Urinalysis:  Basename 11/04/12 2134  COLORURINE YELLOW  LABSPEC 1.026  PHURINE 5.5  GLUCOSEU >1000*  HGBUR SMALL*  BILIRUBINUR NEGATIVE  KETONESUR 15*  PROTEINUR >300*  UROBILINOGEN 0.2  NITRITE NEGATIVE  LEUKOCYTESUR NEGATIVE    No results found for this or any previous visit (from the past 240 hour(s)).  Studies/Results: Dg Chest Portable 1 View  11/04/2012  *RADIOLOGY REPORT*  Clinical Data: Fall with weakness.  History of   hypertension.  PORTABLE CHEST - 1 VIEW  Comparison:  None.  Findings: The heart is enlarged.  Aorta calcified and tortuous.  No infiltrates or failure.  No effusion or pneumothorax.  Osseous structures unremarkable.  IMPRESSION: Cardiomegaly.  No active infiltrates or failure.   Original Report Authenticated By: Rolla Flatten, M.D.     Medications: Scheduled Meds:   . amLODipine  10 mg Oral Daily  . aspirin  81 mg Oral Daily  . benazepril  40 mg Oral Daily  .  ceFAZolin (ANCEF) IV  1 g Intravenous Q8H  .  heparin  5,000 Units Subcutaneous Q8H  . insulin aspart  0-15 Units Subcutaneous TID WC  . insulin glargine  40 Units Subcutaneous Daily  . levothyroxine  100 mcg Oral QAC breakfast  . metoprolol  100 mg Oral BID  . niacin  1,000 mg Oral QHS  . rosuvastatin  20 mg Oral q1800  . ascorbic acid  500 mg Oral Daily  . vitamin E  400 Units Oral Daily   Continuous Infusions:  PRN Meds:.  Assessment/Plan: 1. Fever, chills and erythematous rash involving abd and back Suspect Viral illness Was started on IV ancef by admitter this am, Will continue this today but clinically exam not consistent with cellulitis, could stop in am if remains stable Continue IVF  2. DM: continue lantus, SSI  3. HTN: stable, continue home meds  4. CKD: creatinine close to baseline  DVT proph: heparin SQ  Home tomorrow if stable Time spent coordinating care: 93min   LOS: 2 days   [facility] Pager: [phone] 11/06/2012, 12:20 AM

## 2012-11-06 DIAGNOSIS — R509 Fever, unspecified: Secondary | ICD-10-CM | POA: Diagnosis present

## 2012-11-06 DIAGNOSIS — R21 Rash and other nonspecific skin eruption: Secondary | ICD-10-CM | POA: Diagnosis present

## 2012-11-06 DIAGNOSIS — I2510 Atherosclerotic heart disease of native coronary artery without angina pectoris: Secondary | ICD-10-CM

## 2012-11-06 LAB — BASIC METABOLIC PANEL
BUN: 20 mg/dL (ref 6–23)
CO2: 23 mEq/L (ref 19–32)
Calcium: 8.3 mg/dL — ABNORMAL LOW (ref 8.4–10.5)
Chloride: 105 mEq/L (ref 96–112)
Creatinine, Ser: 1.54 mg/dL — ABNORMAL HIGH (ref 0.50–1.35)
GFR calc Af Amer: 51 mL/min — ABNORMAL LOW (ref >90)
GFR calc non Af Amer: 44 mL/min — ABNORMAL LOW (ref >90)
Glucose, Bld: 165 mg/dL — ABNORMAL HIGH (ref 70–99)
Potassium: 3.5 mEq/L (ref 3.5–5.1)
Sodium: 139 mEq/L (ref 135–145)

## 2012-11-06 LAB — CBC
HCT: 36.4 % — ABNORMAL LOW (ref 39.0–52.0)
Hemoglobin: 12.2 g/dL — ABNORMAL LOW (ref 13.0–17.0)
MCH: 29 pg (ref 26.0–34.0)
MCHC: 33.5 g/dL (ref 30.0–36.0)
MCV: 86.5 fL (ref 78.0–100.0)
Platelets: 132 10*3/uL — ABNORMAL LOW (ref 150–400)
RBC: 4.21 MIL/uL — ABNORMAL LOW (ref 4.22–5.81)
RDW: 14.4 % (ref 11.5–15.5)
WBC: 8.4 10*3/uL (ref 4.0–10.5)

## 2012-11-06 LAB — GLUCOSE, CAPILLARY: Glucose-Capillary: 187 mg/dL — ABNORMAL HIGH (ref 70–99)

## 2012-11-06 NOTE — Progress Notes (Signed)
AVS reviewed with pt at bedside; teach back method used. Pt verbalized understanding of AVS and had no questions. IV removed. Pt remains stable. Pt to be transported via wheelchair to exit of facility. [place], Franky Macho

## 2012-11-06 NOTE — Progress Notes (Signed)
Nurse tech reported that pt got 3rd finger of right hand caught in the side of wheelchair as he was hurriedly sitting down. On assessment, top layer of skin torn on top of 3rd finger just below the nail. Area cleaned and dry dressing applied. Pt said he did not want to stay, he was going home. Educated pt on keep area clean and covered until it healed. Pt verbalized understanding. Family here to take pt home. Pt taken down in wheelchair to exit of facility. [place], Franky Macho

## 2012-11-07 NOTE — Discharge Summary (Addendum)
Physician Discharge Summary  Michael Trevino [id number] DOB: Sep 12, 1941 DOA: 11/04/2012  PCP: Redge Gainer, MD  Admit date: 11/04/2012 Discharge date: 11/07/2012  Time spent: 40 minutes  Recommendations for Outpatient Follow-up:  1. Followup with primary care physician within one week  Discharge Diagnoses:  Principal Problem:  Rash Active Problems:  DM (diabetes mellitus)  Fever CKD stage III  Discharge Condition: Stable  Diet recommendation: Regular  Filed Weights   11/05/12 0005 11/05/12 2151  Weight: 111.9 kg (246 lb 11.1 oz) 113.1 kg (249 lb 5.4 oz)    History of present illness:  Michael Trevino is a 71 y.o. male who presents with c/o fever, chills, generalized weakness, and a fall earlier this evening. Symptoms onset earlier today, and are associated with an erythematous rash on his abdomen. Of note he takes his daily Lantus shots in his abdomen area. The rash is non painful and spans across his abdomen, he is unsure wether the rash was present before the fall or not.  In the ED he was noted to be running a temperature of 100.3, started on ancef for his cellulitis, hospitalist has been asked to admit.   Hospital Course:   1. Fever: With chills and erythematous rash involving the abdomen and the back, this was initially thought to be secondary to cellulitis, this does not look clinically like cellulitis. Patient was started on Ancef empirically on the emergency department as were discontinued, patient treated conservatively with Tylenol and IV fluids. I think this is viral transient infection. He did well without any problems he wanted to go back home so he was discharged home.  2. Rash: Erythematous macular rash involving his abdomen and back, it was thought initially to be cellulitis, it does not look like cellulitis as mentioned above. Looks to me like a transient viral illness. Patient should followup with his primary care physician for further evaluation.  3. Diabetes  mellitus type 2: His home medications including Lantus insulin were continued throughout the hospital so that I changes.  4. CKD stage III: Baseline creatinine in the EMR 1.5, patient was present with creatinine 1.6 and was back to his baseline of the day of discharge.  Procedures:  None  Consultations:  None  Discharge Exam: Filed Vitals:   11/05/12 1735 11/05/12 2151 11/06/12 0550 11/06/12 0800  BP: 119/52 140/64 133/66 152/78  Pulse: 75 87 76 80  Temp: 98.3 F (36.8 C) 99 F (37.2 C) 99.1 F (37.3 C) 97.8 F (36.6 C)  TempSrc: Oral Oral Oral Oral  Resp: 20 22 22 22   Height:      Weight:  113.1 kg (249 lb 5.4 oz)    SpO2: 98% 95% 92% 95%   General: Alert and awake, oriented x3, not in any acute distress. HEENT: anicteric sclera, pupils reactive to light and accommodation, EOMI CVS: S1-S2 clear, no murmur rubs or gallops Chest: clear to auscultation bilaterally, no wheezing, rales or rhonchi Abdomen: soft nontender, nondistended, normal bowel sounds, no organomegaly Extremities: no cyanosis, clubbing or edema noted bilaterally Neuro: Cranial nerves II-XII intact, no focal neurological deficits  Discharge Instructions  Discharge Orders    Future Orders Please Complete By Expires   Diet - low sodium heart healthy      Increase activity slowly          Medication List     As of 11/07/2012  3:41 PM    TAKE these medications         amLODipine 10 MG tablet  Commonly known as: NORVASC   Take 10 mg by mouth daily.      ascorbic acid 500 MG tablet   Commonly known as: VITAMIN C   Take 500 mg by mouth daily.      aspirin 81 MG tablet   Take 81 mg by mouth daily.      benazepril 40 MG tablet   Commonly known as: LOTENSIN   Take 40 mg by mouth daily.      furosemide 40 MG tablet   Commonly known as: LASIX   Take 40 mg by mouth 2 (two) times daily.      glimepiride 4 MG tablet   Commonly known as: AMARYL   Take 4 mg by mouth 2 (two) times daily.       insulin glargine 100 UNIT/ML injection   Commonly known as: LANTUS   Inject 40 Units into the skin daily.      levothyroxine 100 MCG tablet   Commonly known as: SYNTHROID, LEVOTHROID   Take 100 mcg by mouth daily.      metoprolol 100 MG tablet   Commonly known as: LOPRESSOR   Take 100 mg by mouth 2 (two) times daily.      niacin 1000 MG CR tablet   Commonly known as: NIASPAN   Take 1,000 mg by mouth at bedtime.      pioglitazone 30 MG tablet   Commonly known as: ACTOS   Take 30 mg by mouth daily.      rosuvastatin 20 MG tablet   Commonly known as: CRESTOR   Take 20 mg by mouth at bedtime.      vitamin E 400 UNIT capsule   Take 400 Units by mouth daily.           Follow-up Information    Follow up with Redge Gainer, MD. In 1 week.   Contact information:   [address] [place]  [id number] [phone]           The results of significant diagnostics from this hospitalization (including imaging, microbiology, ancillary and laboratory) are listed below for reference.    Significant Diagnostic Studies: Dg Chest Portable 1 View  11/04/2012  *RADIOLOGY REPORT*  Clinical Data: Fall with weakness.  History of   hypertension.  PORTABLE CHEST - 1 VIEW  Comparison: None.  Findings: The heart is enlarged.  Aorta calcified and tortuous.  No infiltrates or failure.  No effusion or pneumothorax.  Osseous structures unremarkable.  IMPRESSION: Cardiomegaly.  No active infiltrates or failure.   Original Report Authenticated By: Rolla Flatten, M.D.     Microbiology: No results found for this or any previous visit (from the past 240 hour(s)).   Labs: Basic Metabolic Panel:  Lab 0000000 0540 11/05/12 0500 11/04/12 1818  NA 139 139 139  K 3.5 3.4* 3.6  CL 105 107 104  CO2 23 22 22   GLUCOSE 165* 190* 215*  BUN 20 21 18   CREATININE 1.54* 1.67* 1.52*  CALCIUM 8.3* 8.6 9.1  MG -- -- --  PHOS -- -- --   Liver Function Tests:  Lab 11/04/12 1818  AST 19  ALT 18    ALKPHOS 92  BILITOT 0.5  PROT 7.5  ALBUMIN 3.5   No results found for this basename: LIPASE:5,AMYLASE:5 in the last 168 hours No results found for this basename: AMMONIA:5 in the last 168 hours CBC:  Lab 11/06/12 0540 11/05/12 0500 11/04/12 1818  WBC 8.4 12.5* 10.1  NEUTROABS -- -- 8.4*  HGB 12.2* 13.1 14.8  HCT 36.4* 37.5* 44.0  MCV 86.5 85.8 86.8  PLT 132* 128* 135*   Cardiac Enzymes:  Lab 11/04/12 1913 11/04/12 1824  CKTOTAL 165 --  CKMB -- --  CKMBINDEX -- --  TROPONINI -- <0.30   BNP: BNP (last 3 results) No results found for this basename: PROBNP:3 in the last 8760 hours CBG:  Lab 11/06/12 0757 11/05/12 2156 11/05/12 1612 11/05/12 1142 11/05/12 0813  GLUCAP 187* 208* 183* 193* 174*       Signed:  Kimothy Kishimoto A  [facility] 11/07/2012, 3:41 PM

## 2013-01-30 ENCOUNTER — Other Ambulatory Visit: Payer: Self-pay | Admitting: Physician Assistant

## 2013-01-30 DIAGNOSIS — E119 Type 2 diabetes mellitus without complications: Secondary | ICD-10-CM

## 2013-01-30 MED ORDER — INSULIN GLARGINE 100 UNIT/ML ~~LOC~~ SOLN: 50.0000 [IU] | Freq: Every day | SUBCUTANEOUS | Status: DC

## 2013-02-11 ENCOUNTER — Other Ambulatory Visit: Payer: Self-pay

## 2013-02-11 ENCOUNTER — Other Ambulatory Visit: Payer: Self-pay | Admitting: Physician Assistant

## 2013-02-11 DIAGNOSIS — E039 Hypothyroidism, unspecified: Secondary | ICD-10-CM

## 2013-02-11 NOTE — Telephone Encounter (Signed)
authorized

## 2013-02-11 NOTE — Telephone Encounter (Signed)
No thyroid lab work since 07/12/11

## 2013-02-13 MED ORDER — LEVOTHYROXINE SODIUM 100 MCG PO TABS: 100.0000 ug | ORAL_TABLET | Freq: Every day | ORAL | Status: DC

## 2013-02-23 ENCOUNTER — Emergency Department (HOSPITAL_COMMUNITY)
Admission: EM | Admit: 2013-02-23 | Discharge: 2013-02-23 | Disposition: A | Discharge status: Home / Self Care | Payer: Medicare Other | Source: Home / Self Care | Attending: Emergency Medicine | Admitting: Emergency Medicine

## 2013-02-23 ENCOUNTER — Other Ambulatory Visit: Payer: Self-pay

## 2013-02-23 ENCOUNTER — Encounter (HOSPITAL_COMMUNITY): Payer: Self-pay | Admitting: Family Medicine

## 2013-02-23 ENCOUNTER — Emergency Department (HOSPITAL_COMMUNITY): Payer: Medicare Other

## 2013-02-23 DIAGNOSIS — Z794 Long term (current) use of insulin: Secondary | ICD-10-CM | POA: Insufficient documentation

## 2013-02-23 DIAGNOSIS — IMO0001 Reserved for inherently not codable concepts without codable children: Secondary | ICD-10-CM | POA: Insufficient documentation

## 2013-02-23 DIAGNOSIS — E860 Dehydration: Secondary | ICD-10-CM | POA: Insufficient documentation

## 2013-02-23 DIAGNOSIS — Z7982 Long term (current) use of aspirin: Secondary | ICD-10-CM | POA: Insufficient documentation

## 2013-02-23 DIAGNOSIS — R0682 Tachypnea, not elsewhere classified: Secondary | ICD-10-CM | POA: Insufficient documentation

## 2013-02-23 DIAGNOSIS — R5381 Other malaise: Secondary | ICD-10-CM | POA: Insufficient documentation

## 2013-02-23 DIAGNOSIS — I10 Essential (primary) hypertension: Secondary | ICD-10-CM | POA: Insufficient documentation

## 2013-02-23 DIAGNOSIS — M1990 Unspecified osteoarthritis, unspecified site: Secondary | ICD-10-CM | POA: Insufficient documentation

## 2013-02-23 DIAGNOSIS — I252 Old myocardial infarction: Secondary | ICD-10-CM | POA: Insufficient documentation

## 2013-02-23 DIAGNOSIS — Z79899 Other long term (current) drug therapy: Secondary | ICD-10-CM | POA: Insufficient documentation

## 2013-02-23 DIAGNOSIS — R509 Fever, unspecified: Secondary | ICD-10-CM

## 2013-02-23 DIAGNOSIS — E119 Type 2 diabetes mellitus without complications: Secondary | ICD-10-CM | POA: Insufficient documentation

## 2013-02-23 LAB — PROCALCITONIN: Procalcitonin: 1.45 ng/mL

## 2013-02-23 LAB — URINALYSIS, ROUTINE W REFLEX MICROSCOPIC
Bilirubin Urine: NEGATIVE
Glucose, UA: 100 mg/dL — AB
Ketones, ur: 15 mg/dL — AB
Leukocytes, UA: NEGATIVE
Nitrite: NEGATIVE
Protein, ur: >300 mg/dL — AB
Specific Gravity, Urine: 1.016 (ref 1.005–1.030)
Urobilinogen, UA: 1 mg/dL (ref 0.0–1.0)
pH: 6.5 (ref 5.0–8.0)

## 2013-02-23 LAB — COMPREHENSIVE METABOLIC PANEL
ALT: 26 U/L (ref 0–53)
AST: 47 U/L — ABNORMAL HIGH (ref 0–37)
Albumin: 3.2 g/dL — ABNORMAL LOW (ref 3.5–5.2)
Alkaline Phosphatase: 69 U/L (ref 39–117)
BUN: 11 mg/dL (ref 6–23)
CO2: 23 mEq/L (ref 19–32)
Calcium: 8.7 mg/dL (ref 8.4–10.5)
Chloride: 103 mEq/L (ref 96–112)
Creatinine, Ser: 1.24 mg/dL (ref 0.50–1.35)
GFR calc Af Amer: 65 mL/min — ABNORMAL LOW (ref >90)
GFR calc non Af Amer: 56 mL/min — ABNORMAL LOW (ref >90)
Glucose, Bld: 145 mg/dL — ABNORMAL HIGH (ref 70–99)
Potassium: 5.2 mEq/L — ABNORMAL HIGH (ref 3.5–5.1)
Sodium: 136 mEq/L (ref 135–145)
Total Bilirubin: 0.5 mg/dL (ref 0.3–1.2)
Total Protein: 7.8 g/dL (ref 6.0–8.3)

## 2013-02-23 LAB — CBC WITH DIFFERENTIAL/PLATELET
Basophils Absolute: 0 10*3/uL (ref 0.0–0.1)
Basophils Relative: 0 % (ref 0–1)
Eosinophils Absolute: 0 10*3/uL (ref 0.0–0.7)
Eosinophils Relative: 0 % (ref 0–5)
HCT: 37.6 % — ABNORMAL LOW (ref 39.0–52.0)
Hemoglobin: 13.6 g/dL (ref 13.0–17.0)
Lymphocytes Relative: 13 % (ref 12–46)
Lymphs Abs: 1 10*3/uL (ref 0.7–4.0)
MCH: 30.9 pg (ref 26.0–34.0)
MCHC: 36.2 g/dL — ABNORMAL HIGH (ref 30.0–36.0)
MCV: 85.5 fL (ref 78.0–100.0)
Monocytes Absolute: 0.7 10*3/uL (ref 0.1–1.0)
Monocytes Relative: 9 % (ref 3–12)
Neutro Abs: 5.7 10*3/uL (ref 1.7–7.7)
Neutrophils Relative %: 78 % — ABNORMAL HIGH (ref 43–77)
Platelets: 185 10*3/uL (ref 150–400)
RBC: 4.4 MIL/uL (ref 4.22–5.81)
RDW: 14.3 % (ref 11.5–15.5)
WBC: 7.4 10*3/uL (ref 4.0–10.5)

## 2013-02-23 LAB — CG4 I-STAT (LACTIC ACID): Lactic Acid, Venous: 1.35 mmol/L (ref 0.5–2.2)

## 2013-02-23 LAB — URINE MICROSCOPIC-ADD ON

## 2013-02-23 MED ORDER — SODIUM CHLORIDE 0.9 % IV SOLN
1000.0000 mL | INTRAVENOUS | Status: DC
  Administered 2013-02-23: 1000 mL via INTRAVENOUS

## 2013-02-23 MED ORDER — SODIUM CHLORIDE 0.9 % IV SOLN
1000.0000 mL | Freq: Once | INTRAVENOUS | Status: AC
  Administered 2013-02-23: 1000 mL via INTRAVENOUS

## 2013-02-23 MED ORDER — ACETAMINOPHEN 325 MG PO TABS
650.0000 mg | ORAL_TABLET | Freq: Four times a day (QID) | ORAL | Status: DC | PRN
  Administered 2013-02-23: 650 mg via ORAL
  Filled 2013-02-23: qty 2

## 2013-02-23 NOTE — ED Provider Notes (Signed)
History     CSN: [id number]  Arrival date & time 02/23/13  1002   First MD Initiated Contact with Patient 02/23/13 1013      Chief Complaint  Patient presents with  . Weakness    (Consider location/radiation/quality/duration/timing/severity/associated sxs/prior treatment) HPI Comments: Level 5 caveat due to acuity of the patient.  Patient was feeling weaker associated with myalgias and bodyaches over the last 2 or 3 days, getting worse. Family reports patient had similar reaction in December for which she was admitted and found to have cellulitis apparently where he was giving himself insulin injections in his abdominal wall. The patient denies significant headache, runny nose, sore throat. He also denies any coughing, vomiting or diarrhea. His appetite is down. He only had some chicken noodle soup last night and has not eaten anything today. He did take some of his morning medications including aspirin this morning. Otherwise no overt sick contacts.  Patient is a 72 y.o. male presenting with weakness. The history is provided by the patient, the spouse and a relative.  Weakness    Past Medical History  Diagnosis Date  . Arthritis   . Diabetes mellitus without complication   . Hypertension   . MI (myocardial infarction) 2000    History reviewed. No pertinent past surgical history.  History reviewed. No pertinent family history.  History  Substance Use Topics  . Smoking status: Former Smoker    Quit date: 11/05/1984  . Smokeless tobacco: Never Used  . Alcohol Use: No      Review of Systems  Unable to perform ROS: Acuity of condition  Neurological: Positive for weakness.    Allergies  Zocor  Home Medications   Current Outpatient Rx  Name  Route  Sig  Dispense  Refill  . amLODipine (NORVASC) 10 MG tablet   Oral   Take 10 mg by mouth daily.           Marland Kitchen ascorbic acid (VITAMIN C) 500 MG tablet   Oral   Take 500 mg by mouth daily.         Marland Kitchen aspirin 325 MG  EC tablet   Oral   Take 325 mg by mouth daily.         . benazepril (LOTENSIN) 40 MG tablet   Oral   Take 40 mg by mouth daily.           . furosemide (LASIX) 40 MG tablet   Oral   Take 40 mg by mouth 2 (two) times daily.           Marland Kitchen glimepiride (AMARYL) 4 MG tablet   Oral   Take 4 mg by mouth 2 (two) times daily.           . insulin glargine (LANTUS) 100 UNIT/ML injection   Subcutaneous   Inject 0.5 mLs (50 Units total) into the skin daily.   15 mL   5   . levothyroxine (SYNTHROID, LEVOTHROID) 100 MCG tablet   Oral   Take 1 tablet (100 mcg total) by mouth daily.   30 tablet   2   . metoprolol (LOPRESSOR) 100 MG tablet   Oral   Take 100 mg by mouth 2 (two) times daily.           . niacin (NIASPAN) 1000 MG CR tablet   Oral   Take 1,000 mg by mouth at bedtime.           . rosuvastatin (CRESTOR) 20 MG tablet  Oral   Take 20 mg by mouth at bedtime.           . vitamin E 400 UNIT capsule   Oral   Take 400 Units by mouth daily.         . pioglitazone (ACTOS) 30 MG tablet   Oral   Take 30 mg by mouth daily.             BP 125/48  Pulse 84  Temp(Src) 98.1 F (36.7 C) (Oral)  Resp 20  SpO2 95%  Physical Exam  Nursing note and vitals reviewed. Constitutional: He appears well-developed and well-nourished. He appears listless. He is cooperative. He appears ill. No distress.  HENT:  Head: Normocephalic and atraumatic.  Right Ear: Tympanic membrane normal.  Left Ear: Tympanic membrane normal.  Mouth/Throat: Uvula is midline. Mucous membranes are dry. No posterior oropharyngeal edema or posterior oropharyngeal erythema.  Eyes: EOM are normal. Pupils are equal, round, and reactive to light. No scleral icterus.  Neck: Normal range of motion. Neck supple.  Cardiovascular: Normal rate, regular rhythm and intact distal pulses.   No murmur heard. Pulmonary/Chest: Tachypnea noted. He has no wheezes. He has no rales.  Abdominal: Soft. He exhibits no  distension. There is no tenderness. There is no rebound.  Musculoskeletal: He exhibits no edema and no tenderness.  Neurological: He appears listless. No cranial nerve deficit. He exhibits normal muscle tone. Coordination normal.  Skin: Skin is warm and dry. No rash noted. He is not diaphoretic. No pallor.  Psychiatric: He has a normal mood and affect.    ED Course  Procedures (including critical care time)  Labs Reviewed  CBC WITH DIFFERENTIAL - Abnormal; Notable for the following:    HCT 37.6 (*)    MCHC 36.2 (*)    Neutrophils Relative 78 (*)    All other components within normal limits  COMPREHENSIVE METABOLIC PANEL - Abnormal; Notable for the following:    Potassium 5.2 (*)    Glucose, Bld 145 (*)    Albumin 3.2 (*)    AST 47 (*)    GFR calc non Af Amer 56 (*)    GFR calc Af Amer 65 (*)    All other components within normal limits  URINALYSIS, ROUTINE W REFLEX MICROSCOPIC - Abnormal; Notable for the following:    Glucose, UA 100 (*)    Hgb urine dipstick MODERATE (*)    Ketones, ur 15 (*)    Protein, ur >300 (*)    All other components within normal limits  CULTURE, BLOOD (ROUTINE X 2)  CULTURE, BLOOD (ROUTINE X 2)  URINE CULTURE  PROCALCITONIN  URINE MICROSCOPIC-ADD ON  CG4 I-STAT (LACTIC ACID)   Dg Chest Port 1 View  02/23/2013  *RADIOLOGY REPORT*  Clinical Data: Fever.  Generalized weakness.  PORTABLE CHEST - 1 VIEW 02/23/2013 1056 hours:  Comparison: Portable chest x-ray 11/04/2012.  Findings: Cardiac silhouette enlarged but stable.  Suboptimal inspiration due to body habitus accounts for mild atelectasis at the lung bases.  Lungs otherwise clear.  Pulmonary vascularity normal.  IMPRESSION: Suboptimal inspiration accounts for mild basilar atelectasis. Stable cardiomegaly without pulmonary edema.   Original Report Authenticated By: Evangeline Dakin, M.D.      1. Fever   2. Dehydration     Room air saturation is 93% which is borderline low. Supplemental oxygen by  nasal cannula would be ordered  EKG at time time: 01, shows sinus rhythm at a rate of 96, left axis deviation, incomplete right  bundle branch block and criteria for left anterior fascicular block. No ST or T-wave abnormalities. Borderline criteria for left ventricular hypertrophy is noted. Probable left atrial enlargement. No significant change compared to EKG from 11/04/2012. Interpretation is abnormal EKG without significant changes.   11:53 AM I reviewed [facility] and discharge notes report pt likely didn't have cellulitis, instead non specific rash and likely pt had viral exanthem and was febrile from that.  Lactic acid is normal, again not suggesting true sepsis.  Awaiting UA.  CXR shows no infiltrates per radiologist.   3:17 PM Sepsis workup is essentially negative with normal lactic acid, minimally elevated pro calcitonin which is nonspecific. No obvious source of bacterial infection is seen. After IV fluids and fever is reduced, patient reports feeling completely back to his baseline. Patient has been ambulatory here without feeling dizzy. I feel the patient is stable to be discharged to home and patient and family are in agreement. He can followup with his usual primary care physician this week as needed and can return if symptoms get significantly worse.  MDM   Patient feels very hot to touch, has an oral temperature 100.2 and I suspect is febrile. Patient is quite listless appearing although he is oriented. Patient is tachypneic and has a heart rate in the 90s and thus meets sepsis criteria. At this point no obvious evidence of endorgan failure although will begin sepsis protocol with aggressive IV fluids, blood cultures and urine culture, chest x-ray. At this time he is normotensive, answering all questions verbally. He does have a history of diabetes. Previously he was admitted for apparently cellulitis, there is no clinical evidence of cellulitis that I can see. Most likely the patient will  require admission to the hospital. Primary care physician is Dr. Laurance Flatten.          Saddie Benders. Salley Boxley, MD 02/23/13 [phone]

## 2013-02-23 NOTE — ED Notes (Addendum)
Per EMS, sts weakness and increased bladder incontinence. Low grade fever. CBG 197. sts no hx of incontinence. Denies chest pain, SOB, N,V, D. 20 left hand. RR 24. BP 168/89. HR 95

## 2013-02-23 NOTE — ED Notes (Signed)
One blood culture drawn seconds to be drawn by phlebotomy.

## 2013-02-23 NOTE — ED Notes (Signed)
Patient tolerated po challenge and ate [place] and apple sauce without incident.

## 2013-02-23 NOTE — Discharge Instructions (Signed)
 Dehydration, Adult Dehydration is when you lose more fluids from the body than you take in. Vital organs like the kidneys, brain, and heart cannot function without a proper amount of fluids and salt. Any loss of fluids from the body can cause dehydration.  CAUSES   Vomiting.  Diarrhea.  Excessive sweating.  Excessive urine output.  Fever. SYMPTOMS  Mild dehydration  Thirst.  Dry lips.  Slightly dry mouth. Moderate dehydration  Very dry mouth.  Sunken eyes.  Skin does not bounce back quickly when lightly pinched and released.  Dark urine and decreased urine production.  Decreased tear production.  Headache. Severe dehydration  Very dry mouth.  Extreme thirst.  Rapid, weak pulse (more than 100 beats per minute at rest).  Cold hands and feet.  Not able to sweat in spite of heat and temperature.  Rapid breathing.  Blue lips.  Confusion and lethargy.  Difficulty being awakened.  Minimal urine production.  No tears. DIAGNOSIS  Your caregiver will diagnose dehydration based on your symptoms and your exam. Blood and urine tests will help confirm the diagnosis. The diagnostic evaluation should also identify the cause of dehydration. TREATMENT  Treatment of mild or moderate dehydration can often be done at home by increasing the amount of fluids that you drink. It is best to drink small amounts of fluid more often. Drinking too much at one time can make vomiting worse. Refer to the home care instructions below. Severe dehydration needs to be treated at the hospital where you will probably be given intravenous (IV) fluids that contain water and electrolytes. HOME CARE INSTRUCTIONS   Ask your caregiver about specific rehydration instructions.  Drink enough fluids to keep your urine clear or pale yellow.  Drink small amounts frequently if you have nausea and vomiting.  Eat as you normally do.  Avoid:  Foods or drinks high in sugar.  Carbonated  drinks.  Juice.  Extremely hot or cold fluids.  Drinks with caffeine.  Fatty, greasy foods.  Alcohol.  Tobacco.  Overeating.  Gelatin desserts.  Wash your hands well to avoid spreading bacteria and viruses.  Only take over-the-counter or prescription medicines for pain, discomfort, or fever as directed by your caregiver.  Ask your caregiver if you should continue all prescribed and over-the-counter medicines.  Keep all follow-up appointments with your caregiver. SEEK MEDICAL CARE IF:  You have abdominal pain and it increases or stays in one area (localizes).  You have a rash, stiff neck, or severe headache.  You are irritable, sleepy, or difficult to awaken.  You are weak, dizzy, or extremely thirsty. SEEK IMMEDIATE MEDICAL CARE IF:   You are unable to keep fluids down or you get worse despite treatment.  You have frequent episodes of vomiting or diarrhea.  You have blood or green matter (bile) in your vomit.  You have blood in your stool or your stool looks black and tarry.  You have not urinated in 6 to 8 hours, or you have only urinated a small amount of very dark urine.  You have a fever.  You faint. MAKE SURE YOU:   Understand these instructions.  Will watch your condition.  Will get help right away if you are not doing well or get worse. Document Released: 10/30/2005 Document Revised: 01/22/2012 Document Reviewed: 06/19/2011 [facility] Patient Information 2013 [place], [place].     Fever, Adult A fever is a higher than normal body temperature. In an adult, an oral temperature around 98.6 F (37 C) is considered  normal. A temperature of 100.4 F (38 C) or higher is generally considered a fever. Mild or moderate fevers generally have no long-term effects and often do not require treatment. Extreme fever (greater than or equal to 106 F or 41.1 C) can cause seizures. The sweating that may occur with repeated or prolonged fever may cause dehydration.  Elderly people can develop confusion during a fever. A measured temperature can vary with:  Age.  Time of day.  Method of measurement (mouth, underarm, rectal, or ear). The fever is confirmed by taking a temperature with a thermometer. Temperatures can be taken different ways. Some methods are accurate and some are not.  An oral temperature is used most commonly. Electronic thermometers are fast and accurate.  An ear temperature will only be accurate if the thermometer is positioned as recommended by the manufacturer.  A rectal temperature is accurate and done for those adults who have a condition where an oral temperature cannot be taken.  An underarm (axillary) temperature is not accurate and not recommended. Fever is a symptom, not a disease.  CAUSES   Infections commonly cause fever.  Some noninfectious causes for fever include:  Some arthritis conditions.  Some thyroid  or adrenal gland conditions.  Some immune system conditions.  Some types of cancer.  A medicine reaction.  High doses of certain street drugs such as methamphetamine.  Dehydration.  Exposure to high outside or room temperatures.  Occasionally, the source of a fever cannot be determined. This is sometimes called a fever of unknown origin (FUO).  Some situations may lead to a temporary rise in body temperature that may go away on its own. Examples are:  Childbirth.  Surgery.  Intense exercise. HOME CARE INSTRUCTIONS   Take appropriate medicines for fever. Follow dosing instructions carefully. If you use acetaminophen  to reduce the fever, be careful to avoid taking other medicines that also contain acetaminophen . Do not take aspirin  for a fever if you are younger than age 69. There is an association with Reye's syndrome. Reye's syndrome is a rare but potentially deadly disease.  If an infection is present and antibiotics have been prescribed, take them as directed. Finish them even if you start  to feel better.  Rest as needed.  Maintain an adequate fluid intake. To prevent dehydration during an illness with prolonged or recurrent fever, you may need to drink extra fluid.Drink enough fluids to keep your urine clear or pale yellow.  Sponging or bathing with room temperature water may help reduce body temperature. Do not use ice water or alcohol sponge baths.  Dress comfortably, but do not over-bundle. SEEK MEDICAL CARE IF:   You are unable to keep fluids down.  You develop vomiting or diarrhea.  You are not feeling at least partly better after 3 days.  You develop new symptoms or problems. SEEK IMMEDIATE MEDICAL CARE IF:   You have shortness of breath or trouble breathing.  You develop excessive weakness.  You are dizzy or you faint.  You are extremely thirsty or you are making little or no urine.  You develop new pain that was not there before (such as in the head, neck, chest, back, or abdomen).  You have persistant vomiting and diarrhea for more than 1 to 2 days.  You develop a stiff neck or your eyes become sensitive to light.  You develop a skin rash.  You have a fever or persistent symptoms for more than 2 to 3 days.  You have a fever and  your symptoms suddenly get worse. MAKE SURE YOU:   Understand these instructions.  Will watch your condition.  Will get help right away if you are not doing well or get worse. Document Released: 04/25/2001 Document Revised: 01/22/2012 Document Reviewed: 08/31/2011 [facility] Patient Information 2013 [place], [place].

## 2013-02-24 ENCOUNTER — Telehealth (HOSPITAL_COMMUNITY): Payer: Self-pay | Admitting: *Deleted

## 2013-02-24 ENCOUNTER — Encounter (HOSPITAL_COMMUNITY): Payer: Self-pay | Admitting: Emergency Medicine

## 2013-02-24 ENCOUNTER — Inpatient Hospital Stay (HOSPITAL_COMMUNITY)
Admission: EM | Admit: 2013-02-24 | Discharge: 2013-02-27 | DRG: 872 | Disposition: A | Discharge status: Home Health | Payer: Medicare Other | Attending: Internal Medicine | Admitting: Internal Medicine

## 2013-02-24 ENCOUNTER — Telehealth: Payer: Self-pay | Admitting: Family Medicine

## 2013-02-24 ENCOUNTER — Telehealth: Payer: Self-pay | Admitting: *Deleted

## 2013-02-24 ENCOUNTER — Other Ambulatory Visit: Payer: Self-pay

## 2013-02-24 DIAGNOSIS — Z550 Illiteracy and low-level literacy: Secondary | ICD-10-CM

## 2013-02-24 DIAGNOSIS — Z7982 Long term (current) use of aspirin: Secondary | ICD-10-CM

## 2013-02-24 DIAGNOSIS — E1129 Type 2 diabetes mellitus with other diabetic kidney complication: Secondary | ICD-10-CM | POA: Diagnosis present

## 2013-02-24 DIAGNOSIS — N390 Urinary tract infection, site not specified: Secondary | ICD-10-CM

## 2013-02-24 DIAGNOSIS — R7881 Bacteremia: Secondary | ICD-10-CM

## 2013-02-24 DIAGNOSIS — A401 Sepsis due to streptococcus, group B: Secondary | ICD-10-CM

## 2013-02-24 DIAGNOSIS — I2510 Atherosclerotic heart disease of native coronary artery without angina pectoris: Secondary | ICD-10-CM

## 2013-02-24 DIAGNOSIS — A409 Streptococcal sepsis, unspecified: Principal | ICD-10-CM | POA: Diagnosis present

## 2013-02-24 DIAGNOSIS — I252 Old myocardial infarction: Secondary | ICD-10-CM

## 2013-02-24 DIAGNOSIS — I10 Essential (primary) hypertension: Secondary | ICD-10-CM

## 2013-02-24 DIAGNOSIS — R509 Fever, unspecified: Secondary | ICD-10-CM

## 2013-02-24 DIAGNOSIS — S6990XD Unspecified injury of unspecified wrist, hand and finger(s), subsequent encounter: Secondary | ICD-10-CM

## 2013-02-24 DIAGNOSIS — L0390 Cellulitis, unspecified: Secondary | ICD-10-CM

## 2013-02-24 DIAGNOSIS — N189 Chronic kidney disease, unspecified: Secondary | ICD-10-CM

## 2013-02-24 DIAGNOSIS — E785 Hyperlipidemia, unspecified: Secondary | ICD-10-CM

## 2013-02-24 DIAGNOSIS — I129 Hypertensive chronic kidney disease with stage 1 through stage 4 chronic kidney disease, or unspecified chronic kidney disease: Secondary | ICD-10-CM | POA: Diagnosis present

## 2013-02-24 DIAGNOSIS — N529 Male erectile dysfunction, unspecified: Secondary | ICD-10-CM

## 2013-02-24 DIAGNOSIS — M1990 Unspecified osteoarthritis, unspecified site: Secondary | ICD-10-CM | POA: Diagnosis present

## 2013-02-24 DIAGNOSIS — R21 Rash and other nonspecific skin eruption: Secondary | ICD-10-CM

## 2013-02-24 DIAGNOSIS — A419 Sepsis, unspecified organism: Secondary | ICD-10-CM | POA: Diagnosis present

## 2013-02-24 DIAGNOSIS — E119 Type 2 diabetes mellitus without complications: Secondary | ICD-10-CM

## 2013-02-24 DIAGNOSIS — Z79899 Other long term (current) drug therapy: Secondary | ICD-10-CM

## 2013-02-24 DIAGNOSIS — S6991XA Unspecified injury of right wrist, hand and finger(s), initial encounter: Secondary | ICD-10-CM

## 2013-02-24 DIAGNOSIS — E039 Hypothyroidism, unspecified: Secondary | ICD-10-CM

## 2013-02-24 DIAGNOSIS — B951 Streptococcus, group B, as the cause of diseases classified elsewhere: Secondary | ICD-10-CM

## 2013-02-24 DIAGNOSIS — S6990XA Unspecified injury of unspecified wrist, hand and finger(s), initial encounter: Secondary | ICD-10-CM

## 2013-02-24 DIAGNOSIS — Z87891 Personal history of nicotine dependence: Secondary | ICD-10-CM

## 2013-02-24 DIAGNOSIS — N182 Chronic kidney disease, stage 2 (mild): Secondary | ICD-10-CM | POA: Diagnosis present

## 2013-02-24 HISTORY — DX: Sepsis due to Streptococcus, group B: A40.1

## 2013-02-24 LAB — COMPREHENSIVE METABOLIC PANEL
ALT: 26 U/L (ref 0–53)
AST: 28 U/L (ref 0–37)
Albumin: 3 g/dL — ABNORMAL LOW (ref 3.5–5.2)
Alkaline Phosphatase: 66 U/L (ref 39–117)
BUN: 14 mg/dL (ref 6–23)
CO2: 22 mEq/L (ref 19–32)
Calcium: 8.6 mg/dL (ref 8.4–10.5)
Chloride: 100 mEq/L (ref 96–112)
Creatinine, Ser: 1.45 mg/dL — ABNORMAL HIGH (ref 0.50–1.35)
GFR calc Af Amer: 54 mL/min — ABNORMAL LOW (ref >90)
GFR calc non Af Amer: 47 mL/min — ABNORMAL LOW (ref >90)
Glucose, Bld: 140 mg/dL — ABNORMAL HIGH (ref 70–99)
Potassium: 3.6 mEq/L (ref 3.5–5.1)
Sodium: 134 mEq/L — ABNORMAL LOW (ref 135–145)
Total Bilirubin: 0.4 mg/dL (ref 0.3–1.2)
Total Protein: 7.2 g/dL (ref 6.0–8.3)

## 2013-02-24 LAB — CBC WITH DIFFERENTIAL/PLATELET
Basophils Absolute: 0 10*3/uL (ref 0.0–0.1)
Basophils Relative: 0 % (ref 0–1)
Eosinophils Absolute: 0 10*3/uL (ref 0.0–0.7)
Eosinophils Relative: 0 % (ref 0–5)
HCT: 36.5 % — ABNORMAL LOW (ref 39.0–52.0)
Hemoglobin: 12.8 g/dL — ABNORMAL LOW (ref 13.0–17.0)
Lymphocytes Relative: 18 % (ref 12–46)
Lymphs Abs: 1.3 10*3/uL (ref 0.7–4.0)
MCH: 30.2 pg (ref 26.0–34.0)
MCHC: 35.1 g/dL (ref 30.0–36.0)
MCV: 86.1 fL (ref 78.0–100.0)
Monocytes Absolute: 0.8 10*3/uL (ref 0.1–1.0)
Monocytes Relative: 10 % (ref 3–12)
Neutro Abs: 5.4 10*3/uL (ref 1.7–7.7)
Neutrophils Relative %: 72 % (ref 43–77)
Platelets: 130 10*3/uL — ABNORMAL LOW (ref 150–400)
RBC: 4.24 MIL/uL (ref 4.22–5.81)
RDW: 14.6 % (ref 11.5–15.5)
WBC: 7.5 10*3/uL (ref 4.0–10.5)

## 2013-02-24 LAB — URINE CULTURE: Colony Count: 40000

## 2013-02-24 LAB — LACTIC ACID, PLASMA: Lactic Acid, Venous: 1.5 mmol/L (ref 0.5–2.2)

## 2013-02-24 LAB — GLUCOSE, CAPILLARY: Glucose-Capillary: 122 mg/dL — ABNORMAL HIGH (ref 70–99)

## 2013-02-24 MED ORDER — DEXTROSE 5 % IV SOLN: 1.5000 mg/kg | Freq: Three times a day (TID) | INTRAVENOUS | Status: DC

## 2013-02-24 MED ORDER — FUROSEMIDE 40 MG PO TABS
40.0000 mg | ORAL_TABLET | Freq: Two times a day (BID) | ORAL | Status: DC
  Administered 2013-02-25 – 2013-02-27 (×6): 40 mg via ORAL
  Filled 2013-02-24 (×7): qty 1

## 2013-02-24 MED ORDER — AMLODIPINE BESYLATE 10 MG PO TABS
10.0000 mg | ORAL_TABLET | Freq: Every day | ORAL | Status: DC
  Administered 2013-02-25 – 2013-02-27 (×3): 10 mg via ORAL
  Filled 2013-02-24 (×3): qty 1

## 2013-02-24 MED ORDER — ACETAMINOPHEN 325 MG PO TABS
650.0000 mg | ORAL_TABLET | Freq: Once | ORAL | Status: AC
  Administered 2013-02-24: 650 mg via ORAL
  Filled 2013-02-24: qty 2

## 2013-02-24 MED ORDER — EZETIMIBE 10 MG PO TABS
10.0000 mg | ORAL_TABLET | Freq: Every day | ORAL | Status: DC
  Administered 2013-02-25 – 2013-02-27 (×3): 10 mg via ORAL
  Filled 2013-02-24 (×3): qty 1

## 2013-02-24 MED ORDER — SODIUM CHLORIDE 0.9 % IV SOLN
INTRAVENOUS | Status: DC
  Administered 2013-02-26: 1000 mL via INTRAVENOUS
  Administered 2013-02-26 – 2013-02-27 (×2): via INTRAVENOUS

## 2013-02-24 MED ORDER — HEPARIN SODIUM (PORCINE) 5000 UNIT/ML IJ SOLN
5000.0000 [IU] | Freq: Three times a day (TID) | INTRAMUSCULAR | Status: DC
  Administered 2013-02-25 – 2013-02-27 (×8): 5000 [IU] via SUBCUTANEOUS
  Filled 2013-02-24 (×11): qty 1

## 2013-02-24 MED ORDER — NIACIN ER 500 MG PO CPCR
1000.0000 mg | ORAL_CAPSULE | Freq: Every day | ORAL | Status: DC
  Administered 2013-02-25 – 2013-02-26 (×3): 1000 mg via ORAL
  Filled 2013-02-24 (×5): qty 2

## 2013-02-24 MED ORDER — INSULIN GLARGINE 100 UNIT/ML ~~LOC~~ SOLN
25.0000 [IU] | Freq: Every day | SUBCUTANEOUS | Status: DC
  Administered 2013-02-25: 25 [IU] via SUBCUTANEOUS
  Filled 2013-02-24 (×2): qty 0.25

## 2013-02-24 MED ORDER — SODIUM CHLORIDE 0.9 % IJ SOLN
3.0000 mL | Freq: Two times a day (BID) | INTRAMUSCULAR | Status: DC
  Administered 2013-02-25 – 2013-02-27 (×3): 3 mL via INTRAVENOUS

## 2013-02-24 MED ORDER — BENAZEPRIL HCL 40 MG PO TABS
40.0000 mg | ORAL_TABLET | Freq: Every day | ORAL | Status: DC
  Administered 2013-02-25 – 2013-02-27 (×3): 40 mg via ORAL
  Filled 2013-02-24 (×3): qty 1

## 2013-02-24 MED ORDER — VANCOMYCIN HCL 10 G IV SOLR
1250.0000 mg | Freq: Two times a day (BID) | INTRAVENOUS | Status: DC
  Administered 2013-02-25: 1250 mg via INTRAVENOUS
  Filled 2013-02-24 (×3): qty 1250

## 2013-02-24 MED ORDER — LEVOTHYROXINE SODIUM 100 MCG PO TABS
100.0000 ug | ORAL_TABLET | Freq: Every day | ORAL | Status: DC
  Administered 2013-02-25: 100 ug via ORAL
  Filled 2013-02-24: qty 1

## 2013-02-24 MED ORDER — NIACIN ER (ANTIHYPERLIPIDEMIC) 1000 MG PO TBCR: 1000.0000 mg | EXTENDED_RELEASE_TABLET | Freq: Every day | ORAL | Status: DC

## 2013-02-24 MED ORDER — DEXTROSE 5 % IV SOLN
1.0000 g | Freq: Once | INTRAVENOUS | Status: AC
  Administered 2013-02-24: 1 g via INTRAVENOUS
  Filled 2013-02-24: qty 10

## 2013-02-24 MED ORDER — EZETIMIBE 10 MG PO TABS: 10.0000 mg | ORAL_TABLET | Freq: Every day | ORAL | Status: DC

## 2013-02-24 MED ORDER — VANCOMYCIN HCL IN DEXTROSE 1-5 GM/200ML-% IV SOLN
1000.0000 mg | Freq: Once | INTRAVENOUS | Status: AC
  Administered 2013-02-24: 1000 mg via INTRAVENOUS
  Filled 2013-02-24: qty 200

## 2013-02-24 MED ORDER — METOPROLOL TARTRATE 100 MG PO TABS
100.0000 mg | ORAL_TABLET | Freq: Two times a day (BID) | ORAL | Status: DC
  Administered 2013-02-25 – 2013-02-27 (×6): 100 mg via ORAL
  Filled 2013-02-24 (×7): qty 1

## 2013-02-24 MED ORDER — INSULIN ASPART 100 UNIT/ML ~~LOC~~ SOLN
0.0000 [IU] | Freq: Three times a day (TID) | SUBCUTANEOUS | Status: DC
  Administered 2013-02-25: 4 [IU] via SUBCUTANEOUS
  Administered 2013-02-25 – 2013-02-26 (×3): 3 [IU] via SUBCUTANEOUS
  Administered 2013-02-27: 4 [IU] via SUBCUTANEOUS
  Administered 2013-02-27: 7 [IU] via SUBCUTANEOUS

## 2013-02-24 MED ORDER — ASPIRIN EC 325 MG PO TBEC
325.0000 mg | DELAYED_RELEASE_TABLET | Freq: Every day | ORAL | Status: DC
  Administered 2013-02-25 – 2013-02-27 (×4): 325 mg via ORAL
  Filled 2013-02-24 (×4): qty 1

## 2013-02-24 NOTE — ED Notes (Signed)
Pt alert, NAD, calm, interactive, c/o general weakness, (denies: pain, sob, nausea or dizziness), family at [facility] x2, lab at [facility]. VSS. abx being started, antipyretic being given.

## 2013-02-24 NOTE — Telephone Encounter (Signed)
Patient was seen in the ED yesterday.  They called him today and reported that he had an "infection in his blood" and that he needed to return to the ED.  He wanted to wait until tomorrow and his daughter wanted to verify that it was important for him to go back today.  I informed her that it is important for him to return today and that he shouldn't wait until tomorrow.  She stated understanding and will have him to go today.

## 2013-02-24 NOTE — ED Notes (Signed)
Assisted with urinal, at [facility] by EMT.

## 2013-02-24 NOTE — H&P (Signed)
[facility] History and Physical  Michael Trevino [id number] DOB: [birth year] DOA: 02/24/2013  Referring physician: ED PCP: Redge Gainer, MD  Specialists: None  Chief Complaint: Fever, Chills, +BC from yesterday  HPI: Michael Trevino is a 72 y.o. male who has been feeling ill since about thurs of last week (3-4 days).  Symptoms include fever, chills, body aches, myalgias.  Similar symptoms in December and was admitted for cellulitus of abdomengroup at that time.  Patient seen in ED yesterday, fever of 103.6, blood cultures were drawn at that time and patient discharged with f/u scheduled on Wed with PCP.  Today blood cultures (taken yesterday) came back positive x2 for GPC in chains, and his urine culture showed 40k CFU of GBS (S. Agalactiae), patient was called back by ED and told to return to ED.  In ED patient noted to be febrile, tachycardic, and slightly tachypnic, blood pressures were stable.  Given Rocephin and vancomycin and hospitalist asked to admit for bacteremia with sepsis.  Review of Systems: 12 systems reviewed and otherwise negative.  Past Medical History  Diagnosis Date  . Arthritis   . Diabetes mellitus without complication   . Hypertension   . MI (myocardial infarction) 2000   History reviewed. No pertinent past surgical history. Social History:  reports that he quit smoking about 28 years ago. He has never used smokeless tobacco. He reports that he does not drink alcohol. His drug history is not on file.   Allergies  Allergen Reactions  . Zocor (Simvastatin - High Dose)     Unknown    History reviewed. No pertinent family history. No recent illness in family.  Prior to Admission medications   Medication Sig Start Date End Date Taking? Authorizing Provider  amLODipine (NORVASC) 10 MG tablet Take 10 mg by mouth daily.     Yes Historical Provider, MD  ascorbic acid (VITAMIN C) 500 MG tablet Take 500 mg by mouth daily.   Yes Historical Provider, MD  aspirin  325 MG EC tablet Take 325 mg by mouth daily.   Yes Historical Provider, MD  benazepril (LOTENSIN) 40 MG tablet Take 40 mg by mouth daily.     Yes Historical Provider, MD  ezetimibe (ZETIA) 10 MG tablet Take 1 tablet (10 mg total) by mouth daily. 02/24/13  Yes Chipper Herb, MD  furosemide (LASIX) 40 MG tablet Take 40 mg by mouth 2 (two) times daily.     Yes Historical Provider, MD  glimepiride (AMARYL) 4 MG tablet Take 4 mg by mouth 2 (two) times daily.     Yes Historical Provider, MD  insulin glargine (LANTUS) 100 UNIT/ML injection Inject 0.5 mLs (50 Units total) into the skin daily. 01/30/13  Yes Gearlean Alf, PA-C  levothyroxine (SYNTHROID, LEVOTHROID) 100 MCG tablet Take 1 tablet (100 mcg total) by mouth daily. 02/11/13  Yes Gearlean Alf, PA-C  metoprolol (LOPRESSOR) 100 MG tablet Take 100 mg by mouth 2 (two) times daily.     Yes Historical Provider, MD  niacin (NIASPAN) 1000 MG CR tablet Take 1,000 mg by mouth at bedtime.     Yes Historical Provider, MD  pioglitazone (ACTOS) 30 MG tablet Take 30 mg by mouth daily.     Yes Historical Provider, MD  rosuvastatin (CRESTOR) 20 MG tablet Take 20 mg by mouth at bedtime.     Yes Historical Provider, MD  vitamin E 400 UNIT capsule Take 400 Units by mouth daily.   Yes Historical Provider, MD   Physical Exam:  Filed Vitals:   02/24/13 1945 02/24/13 2000 02/24/13 2015 02/24/13 2029  BP: 134/84 124/88 146/53   Pulse: 107 114 124   Temp:      TempSrc:      Resp: 19 29    Height:    5\' 9"  (1.753 m)  Weight:    106.595 kg (235 lb)  SpO2: 96% 97% 95%      General:  NAD, resting comfortably in bed, slightly toxic appearing  Eyes: PEERLA EOMI  ENT: mucous membranes moist  Neck: supple w/o JVD  Cardiovascular: RRR w/o MRG, cant really hear a murmur  Respiratory: CTA B  Abdomen: soft, nt, nd, bs+  Skin: no rash nor lesion, new location where he injects insulin on leg shows no surrounding erythema, edema, or any other sign of  infection  Musculoskeletal: MAE, full ROM all 4 extremities  Psychiatric: normal tone and affect  Neurologic: AAOx3, grossly non-focal   Labs on Admission:  Basic Metabolic Panel:  Recent Labs Lab 02/23/13 1100  NA 136  K 5.2*  CL 103  CO2 23  GLUCOSE 145*  BUN 11  CREATININE 1.24  CALCIUM 8.7   Liver Function Tests:  Recent Labs Lab 02/23/13 1100  AST 47*  ALT 26  ALKPHOS 69  BILITOT 0.5  PROT 7.8  ALBUMIN 3.2*   No results found for this basename: LIPASE, AMYLASE,  in the last 168 hours No results found for this basename: AMMONIA,  in the last 168 hours CBC:  Recent Labs Lab 02/23/13 1100 02/24/13 2011  WBC 7.4 7.2  NEUTROABS 5.7 5.0  HGB 13.6 13.1  HCT 37.6* 37.2*  MCV 85.5 85.7  PLT 185 132*   Cardiac Enzymes: No results found for this basename: CKTOTAL, CKMB, CKMBINDEX, TROPONINI,  in the last 168 hours  BNP (last 3 results) No results found for this basename: PROBNP,  in the last 8760 hours CBG: No results found for this basename: GLUCAP,  in the last 168 hours  Radiological Exams on Admission: Dg Chest Port 1 View  02/23/2013  *RADIOLOGY REPORT*  Clinical Data: Fever.  Generalized weakness.  PORTABLE CHEST - 1 VIEW 02/23/2013 1056 hours:  Comparison: Portable chest x-ray 11/04/2012.  Findings: Cardiac silhouette enlarged but stable.  Suboptimal inspiration due to body habitus accounts for mild atelectasis at the lung bases.  Lungs otherwise clear.  Pulmonary vascularity normal.  IMPRESSION: Suboptimal inspiration accounts for mild basilar atelectasis. Stable cardiomegaly without pulmonary edema.   Original Report Authenticated By: Evangeline Dakin, M.D.     EKG: Independently reviewed. EKG appears unchanged from previous.  Assessment/Plan Principal Problem:   Sepsis due to Streptococcus, group B Active Problems:   DM (diabetes mellitus)   CKD (chronic kidney disease)   Bacteremia due to group B Streptococcus   1. Sepsis due to GBS - 2/2  blood cultures positive and urine culture positive, suspect however that the urine CX may not represent the initial source of this infection in this male patient but rather a "downstream" infection.  Repeat cultures ordered to see if they remain positive despite abx therapy, am somewhat concerned given the second infection in 3 months now and no obvious source that this may represent endocarditis, will continue vanc per pharm consult for the moment, likely needs ID consult Re: need for TEE and then length of ABx in AM.  Predict that it will be at least 2 weeks or so of ABx treatment for bacteremia, 6 weeks if patient does end up with endocarditis.  IVF at 125 cc/hr. 2. DM2 - giving patient half his home dose of lantus (home dose 50 daily giving 25 daily), holding POs and giving high dose SSI.  AC/HS. 3. CKD - creatinine appears baseline as of yesterday, rechecking CMP with admission and BMP in am.    Code Status: Full Code (must indicate code status--if unknown or must be presumed, indicate so) Family Communication: Spoke with family members at bedside (indicate person spoken with, if applicable, with phone number if by telephone) Disposition Plan: Admit to inpatient (indicate anticipated LOS)  Time spent: 70 min  GARDNER, JARED M. [facility] Pager [phone]  If 7PM-7AM, please contact night-coverage [email or web address] Password [facility] 02/24/2013, 9:23 PM

## 2013-02-24 NOTE — ED Notes (Signed)
Pt told to come back today due to abnormal labs with bacteria growth in blood cultures; pt c/o generalized weakness but feels improved from yesterday

## 2013-02-24 NOTE — ED Notes (Signed)
Pt brought back from triage with family in tow; pt undressed and in gown

## 2013-02-24 NOTE — ED Notes (Signed)
Removed blankets for temp of 102.1.

## 2013-02-24 NOTE — Telephone Encounter (Signed)
Appt made for 02-26-13

## 2013-02-24 NOTE — ED Notes (Signed)
No changes, family at [facility], VSS, preparing to transport.

## 2013-02-24 NOTE — Progress Notes (Signed)
ANTIBIOTIC CONSULT NOTE - INITIAL  Pharmacy Consult for Vancomycin Indication: bacteremia with GPC; Group B Strep in urine  Allergies  Allergen Reactions  . Zocor (Simvastatin - High Dose)     Unknown    Patient Measurements: Height: 5\' 9"  (175.3 cm) Weight: 235 lb (106.595 kg) (estimated by patient) IBW/kg (Calculated) : 70.7  Vital Signs: Temp: 102.1 F (38.9 C) (04/14 1941) Temp src: Oral (04/14 1941) BP: 146/53 mmHg (04/14 2015) Pulse Rate: 124 (04/14 2015)  Labs:  Recent Labs  02/23/13 1100 02/24/13 2011  WBC 7.4 7.2  HGB 13.6 13.1  PLT 185 132*  CREATININE 1.24  --    Estimated Creatinine Clearance: 64.8 ml/min (by C-G formula based on Cr of 1.24).  Microbiology: Recent Results (from the past 720 hour(s))  CULTURE, BLOOD (ROUTINE X 2)     Status: None   Collection Time    02/23/13 11:00 AM      Result Value Range Status   Specimen Description BLOOD RIGHT HAND   Final   Special Requests BOTTLES DRAWN AEROBIC ONLY 10CC   Final   Culture  Setup Time 02/23/2013 17:36   Final   Culture     Final   Value: GRAM POSITIVE COCCI IN CHAINS     Note: Gram Stain Report Called to,Read Back By and Verified With: Alanson Aly RN on 02/24/13 at 04:05 by Rise Mu   Report Status PENDING   Incomplete  URINE CULTURE     Status: None   Collection Time    02/23/13 11:36 AM      Result Value Range Status   Specimen Description URINE, CLEAN CATCH   Final   Special Requests NONE   Final   Culture  Setup Time 02/23/2013 17:38   Final   Colony Count 40,000 COLONIES/ML   Final   Culture     Final   Value: GROUP B STREP(S.AGALACTIAE)ISOLATED     Note: TESTING AGAINST S. AGALACTIAE NOT ROUTINELY PERFORMED DUE TO PREDICTABILITY OF AMP/PEN/VAN SUSCEPTIBILITY.   Report Status 02/24/2013 FINAL   Final  CULTURE, BLOOD (ROUTINE X 2)     Status: None   Collection Time    02/23/13 12:24 PM      Result Value Range Status   Specimen Description BLOOD RIGHT ARM   Final   Special Requests  BOTTLES DRAWN AEROBIC AND ANAEROBIC 10CC   Final   Culture  Setup Time 02/23/2013 17:36   Final   Culture     Final   Value: GRAM POSITIVE COCCI IN PAIRS AND CHAINS     Note: Gram Stain Report Called to,Read Back By and Verified With: Lucila Maine 02/24/13 1045 BY [email or web address]   Report Status PENDING   Incomplete    Medical History: Past Medical History  Diagnosis Date  . Arthritis   . Diabetes mellitus without complication   . Hypertension   . MI (myocardial infarction) 2000   Assessment:   72 yr old man seen in ED 02/23/13 with fever, weakness, myalgias x 2-3 days. Septic work-up noted essentially negative, fever resolved after IVF.  Called back to ED today after blood cultures reported with gram + cocci in chains and pairs.  40K/ml Group B Strep in urine. Concern for possible endocarditis.  Tmax 102.1, WBC 7.2  Goal of Therapy:  Vancomycin trough level 15-20 mcg/ml  Plan:   Vancomycin 1 gram IV given in ED ~8:30pm.  Will continue with Vancomycin 1250 mg IV q12hrs.  Follow up renal function, final  culture data and clinical status.  Follow up ID input.    Arty Baumgartner, [place] Pager: [phone] 02/24/2013,9:43 PM

## 2013-02-25 ENCOUNTER — Encounter (HOSPITAL_COMMUNITY): Payer: Self-pay | Admitting: *Deleted

## 2013-02-25 ENCOUNTER — Inpatient Hospital Stay (HOSPITAL_COMMUNITY): Payer: Medicare Other

## 2013-02-25 DIAGNOSIS — S6990XA Unspecified injury of unspecified wrist, hand and finger(s), initial encounter: Secondary | ICD-10-CM

## 2013-02-25 DIAGNOSIS — N390 Urinary tract infection, site not specified: Secondary | ICD-10-CM

## 2013-02-25 DIAGNOSIS — I10 Essential (primary) hypertension: Secondary | ICD-10-CM

## 2013-02-25 LAB — CBC WITH DIFFERENTIAL/PLATELET
Basophils Absolute: 0 10*3/uL (ref 0.0–0.1)
Basophils Relative: 0 % (ref 0–1)
Eosinophils Absolute: 0 10*3/uL (ref 0.0–0.7)
Eosinophils Relative: 0 % (ref 0–5)
HCT: 37.2 % — ABNORMAL LOW (ref 39.0–52.0)
Hemoglobin: 13.1 g/dL (ref 13.0–17.0)
Lymphocytes Relative: 21 % (ref 12–46)
Lymphs Abs: 1.5 10*3/uL (ref 0.7–4.0)
MCH: 30.2 pg (ref 26.0–34.0)
MCHC: 35.2 g/dL (ref 30.0–36.0)
MCV: 85.7 fL (ref 78.0–100.0)
Monocytes Absolute: 0.7 10*3/uL (ref 0.1–1.0)
Monocytes Relative: 10 % (ref 3–12)
Neutro Abs: 5 10*3/uL (ref 1.7–7.7)
Neutrophils Relative %: 69 % (ref 43–77)
Platelets: 132 10*3/uL — ABNORMAL LOW (ref 150–400)
RBC: 4.34 MIL/uL (ref 4.22–5.81)
RDW: 14.4 % (ref 11.5–15.5)
WBC: 7.2 10*3/uL (ref 4.0–10.5)

## 2013-02-25 LAB — BASIC METABOLIC PANEL
BUN: 13 mg/dL (ref 6–23)
CO2: 23 mEq/L (ref 19–32)
Calcium: 8.2 mg/dL — ABNORMAL LOW (ref 8.4–10.5)
Chloride: 104 mEq/L (ref 96–112)
Creatinine, Ser: 1.51 mg/dL — ABNORMAL HIGH (ref 0.50–1.35)
GFR calc Af Amer: 51 mL/min — ABNORMAL LOW (ref >90)
GFR calc non Af Amer: 44 mL/min — ABNORMAL LOW (ref >90)
Glucose, Bld: 100 mg/dL — ABNORMAL HIGH (ref 70–99)
Potassium: 3.7 mEq/L (ref 3.5–5.1)
Sodium: 137 mEq/L (ref 135–145)

## 2013-02-25 LAB — CBC
HCT: 33.6 % — ABNORMAL LOW (ref 39.0–52.0)
Hemoglobin: 11.9 g/dL — ABNORMAL LOW (ref 13.0–17.0)
MCH: 30.1 pg (ref 26.0–34.0)
MCHC: 35.4 g/dL (ref 30.0–36.0)
MCV: 84.8 fL (ref 78.0–100.0)
Platelets: 135 10*3/uL — ABNORMAL LOW (ref 150–400)
RBC: 3.96 MIL/uL — ABNORMAL LOW (ref 4.22–5.81)
RDW: 14.6 % (ref 11.5–15.5)
WBC: 7.2 10*3/uL (ref 4.0–10.5)

## 2013-02-25 LAB — GLUCOSE, CAPILLARY
Glucose-Capillary: 93 mg/dL (ref 70–99)
Glucose-Capillary: 153 mg/dL — ABNORMAL HIGH (ref 70–99)
Glucose-Capillary: 142 mg/dL — ABNORMAL HIGH (ref 70–99)
Glucose-Capillary: 184 mg/dL — ABNORMAL HIGH (ref 70–99)

## 2013-02-25 LAB — MRSA PCR SCREENING: MRSA by PCR: NEGATIVE

## 2013-02-25 MED ORDER — DEXTROSE 5 % IV SOLN
2.0000 g | INTRAVENOUS | Status: DC
  Administered 2013-02-25 – 2013-02-27 (×3): 2 g via INTRAVENOUS
  Filled 2013-02-25 (×3): qty 2

## 2013-02-25 MED ORDER — LEVOTHYROXINE SODIUM 100 MCG PO TABS
100.0000 ug | ORAL_TABLET | Freq: Every day | ORAL | Status: DC
  Administered 2013-02-26 – 2013-02-27 (×2): 100 ug via ORAL
  Filled 2013-02-25 (×4): qty 1

## 2013-02-25 NOTE — Progress Notes (Addendum)
[facility] Progress Note Michael Trevino TEAM 1 - Stepdown/ICU Michael Trevino [id number] DOB: [birth year] DOA: 02/24/2013 PCP: Michael Gainer, MD  Brief narrative: Michael Trevino is a 72 y.o. male who has been feeling ill since about thurs of last week (3-4 days). Symptoms include fever, chills, body aches, myalgias. Similar symptoms in December and was admitted for cellulitus of abdomengroup at that time. Patient seen in ED yesterday, fever of 103.6, blood cultures were drawn at that time and patient discharged with f/u scheduled on Wed with PCP. Today blood cultures (taken yesterday) came back positive x2 for GPC in chains, and his urine culture showed 40k CFU of GBS (S. Agalactiae), patient was called back by ED and told to return to ED.  In ED patient noted to be febrile, tachycardic, and slightly tachypnic, blood pressures were stable. Given Rocephin and vancomycin and hospitalist asked to admit for bacteremia with sepsis.  Assessment/Plan: Principal Problem:   Bacteremia due to group B Streptococcus - d/c Vanc and cont Rocephin - appreciate ID eval  Active Problems:   UTI (urinary tract infection) - per ID, this is likely secondary to the bacteremia    DM (diabetes mellitus) - sugars well controlled on current regimen    CKD (chronic kidney disease) - stable  HTN - stable on current meds  Hypothyroid - cont levothyroxine    Code Status: full code Family Communication: with wife Disposition Plan: transfer to med/surg  Consultants: ID  Procedures: none  Antibiotics: Vanc 4/14-4/15 Rocephin 4/14>>  DVT prophylaxis: heparin  HPI/Subjective: Pt feels well- no complaints- specifically, no dysuria, nausea, vomiting or pain   Objective: Blood pressure 135/68, pulse 48, temperature 98.8 F (37.1 C), temperature source Oral, resp. rate 18, height 5\' 9"  (1.753 m), weight 109.77 kg (242 lb), SpO2 95.00%.  Intake/Output Summary (Last 24 hours) at 02/25/13  2003 Last data filed at 02/25/13 1700  Gross per 24 hour  Intake 2879.5 ml  Output   1900 ml  Net  979.5 ml     Exam: General: No acute respiratory distress Lungs: Clear to auscultation bilaterally without wheezes or crackles Cardiovascular: Regular rate and rhythm without murmur gallop or rub normal S1 and S2 Abdomen: Nontender, nondistended, soft, bowel sounds positive, no rebound, no ascites, no appreciable mass Extremities: No significant cyanosis, clubbing, or edema bilateral lower extremities  Data Reviewed: Basic Metabolic Panel:  Recent Labs Lab 02/23/13 1100 02/24/13 2132 02/25/13 0515  NA 136 134* 137  K 5.2* 3.6 3.7  CL 103 100 104  CO2 23 22 23   GLUCOSE 145* 140* 100*  BUN 11 14 13   CREATININE 1.24 1.45* 1.51*  CALCIUM 8.7 8.6 8.2*   Liver Function Tests:  Recent Labs Lab 02/23/13 1100 02/24/13 2132  AST 47* 28  ALT 26 26  ALKPHOS 69 66  BILITOT 0.5 0.4  PROT 7.8 7.2  ALBUMIN 3.2* 3.0*   No results found for this basename: LIPASE, AMYLASE,  in the last 168 hours No results found for this basename: AMMONIA,  in the last 168 hours CBC:  Recent Labs Lab 02/23/13 1100 02/24/13 2011 02/24/13 2132 02/25/13 0515  WBC 7.4 7.2 7.5 7.2  NEUTROABS 5.7 5.0 5.4  --   HGB 13.6 13.1 12.8* 11.9*  HCT 37.6* 37.2* 36.5* 33.6*  MCV 85.5 85.7 86.1 84.8  PLT 185 132* 130* 135*   Cardiac Enzymes: No results found for this basename: CKTOTAL, CKMB, CKMBINDEX, TROPONINI,  in the last 168 hours BNP (last 3 results)  No results found for this basename: PROBNP,  in the last 8760 hours CBG:  Recent Labs Lab 02/24/13 2052 02/25/13 0740 02/25/13 1133 02/25/13 1653  GLUCAP 122* 93 153* 142*    Recent Results (from the past 240 hour(s))  CULTURE, BLOOD (ROUTINE X 2)     Status: None   Collection Time    02/23/13 11:00 AM      Result Value Range Status   Specimen Description BLOOD RIGHT HAND   Final   Special Requests BOTTLES DRAWN AEROBIC ONLY 10CC    Final   Culture  Setup Time 02/23/2013 17:36   Final   Culture     Final   Value: GROUP B STREP(S.AGALACTIAE)ISOLATED     Note: Gram Stain Report Called to,Read Back By and Verified With: Michael Aly RN on 02/24/13 at 04:05 by Rise Mu   Report Status PENDING   Incomplete  URINE CULTURE     Status: None   Collection Time    02/23/13 11:36 AM      Result Value Range Status   Specimen Description URINE, CLEAN CATCH   Final   Special Requests NONE   Final   Culture  Setup Time 02/23/2013 17:38   Final   Colony Count 40,000 COLONIES/ML   Final   Culture     Final   Value: GROUP B STREP(S.AGALACTIAE)ISOLATED     Note: TESTING AGAINST S. AGALACTIAE NOT ROUTINELY PERFORMED DUE TO PREDICTABILITY OF AMP/PEN/VAN SUSCEPTIBILITY.   Report Status 02/24/2013 FINAL   Final  CULTURE, BLOOD (ROUTINE X 2)     Status: None   Collection Time    02/23/13 12:24 PM      Result Value Range Status   Specimen Description BLOOD RIGHT ARM   Final   Special Requests BOTTLES DRAWN AEROBIC AND ANAEROBIC 10CC   Final   Culture  Setup Time 02/23/2013 17:36   Final   Culture     Final   Value: GROUP B STREP(S.AGALACTIAE)ISOLATED     Note: Gram Stain Report Called to,Read Back By and Verified With: Michael Trevino 02/24/13 1045 BY SMITHERSJ   Report Status PENDING   Incomplete  MRSA PCR SCREENING     Status: None   Collection Time    02/24/13 11:30 PM      Result Value Range Status   MRSA by PCR NEGATIVE  NEGATIVE Final   Comment:            The GeneXpert MRSA Assay (FDA     approved for NASAL specimens     only), is one component of a     comprehensive MRSA colonization     surveillance program. It is not     intended to diagnose MRSA     infection nor to guide or     monitor treatment for     MRSA infections.     Studies:  Recent x-ray studies have been reviewed in detail by the Attending Physician  Scheduled Meds:  Scheduled Meds: . amLODipine  10 mg Oral Daily  . aspirin  325 mg Oral Daily  .  benazepril  40 mg Oral Daily  . cefTRIAXone (ROCEPHIN)  IV  2 g Intravenous Q24H  . ezetimibe  10 mg Oral Daily  . furosemide  40 mg Oral BID  . heparin  5,000 Units Subcutaneous Q8H  . insulin aspart  0-20 Units Subcutaneous TID WC  . insulin glargine  25 Units Subcutaneous Daily  . [START ON 02/26/2013] levothyroxine  100  mcg Oral QAC breakfast  . metoprolol  100 mg Oral BID  . niacin  1,000 mg Oral QHS  . sodium chloride  3 mL Intravenous Q12H   Continuous Infusions: . sodium chloride 125 mL/hr at 02/24/13 2330    Time spent on care of this patient: 42 min   Debbe Odea, MD  [facility] Office  [phone] Pager - Text Page per Shea Evans as per below:  On-Call/Text Page:      [email or web address]      password [email or web address]  If 7PM-7AM, please contact night-coverage [email or web address] Password [email or web address] 02/25/2013, 8:03 PM   LOS: 1 day

## 2013-02-25 NOTE — ED Provider Notes (Signed)
History     CSN: Michael Trevino:[id number]  Arrival date & time 02/24/13  1451   First MD Initiated Contact with Patient 02/24/13 1819      Chief Complaint  Patient presents with  . Abnormal Lab  . Fatigue     HPI Seen in ED yesterday.  BC done and found to be bacteremic.  Patient still running fever, but no known source.  Hx of cellulitis in past but no complaints now.  Denies cough. Past Medical History  Diagnosis Date  . Arthritis   . Diabetes mellitus without complication   . Hypertension   . MI (myocardial infarction) 2000    History reviewed. No pertinent past surgical history.  History reviewed. No pertinent family history.  History  Substance Use Topics  . Smoking status: Former Smoker    Quit date: 11/05/1984  . Smokeless tobacco: Never Used  . Alcohol Use: No      Review of Systems  Constitutional: Positive for fever and fatigue.  HENT: Negative for congestion.   Respiratory: Negative for cough and shortness of breath.   Gastrointestinal: Negative for abdominal distention.  Genitourinary: Negative for dysuria and flank pain.  All other systems reviewed and are negative.    Allergies  Zocor  Home Medications  No current outpatient prescriptions on file.  BP 125/60  Pulse 89  Temp(Src) 98.8 F (37.1 C) (Oral)  Resp 21  Ht 5\' 9"  (1.753 m)  Wt 241 lb 6.5 oz (109.5 kg)  BMI 35.63 kg/m2  SpO2 92%  Physical Exam  Nursing note and vitals reviewed. Constitutional: He is oriented to person, place, and time. He appears well-developed and well-nourished. No distress.  HENT:  Head: Normocephalic and atraumatic.  Eyes: Pupils are equal, round, and reactive to light.  Neck: Normal range of motion. Neck supple. No Brudzinski's sign and no Kernig's sign noted.  Cardiovascular: Normal rate and intact distal pulses.   Pulmonary/Chest: No respiratory distress. He has no wheezes. He has no rales.  Abdominal: Normal appearance. He exhibits no distension. There is no  tenderness. There is no rebound.  Musculoskeletal: Normal range of motion.  Neurological: He is alert and oriented to person, place, and time. No cranial nerve deficit.  Skin: Skin is warm and dry. No rash noted.  Psychiatric: He has a normal mood and affect. His behavior is normal.    ED Course  Procedures (including critical care time) Medications  vancomycin (VANCOCIN) IVPB 1000 mg/200 mL premix (1,000 mg Intravenous New Bag/Given 02/24/13 2033)  cefTRIAXone (ROCEPHIN) 1 g in dextrose 5 % 50 mL IVPB (1 g Intravenous New Bag/Given 02/24/13 2209)  acetaminophen (TYLENOL) tablet 650 mg (650 mg Oral Given 02/24/13 2041)    Labs Reviewed  CBC WITH DIFFERENTIAL - Abnormal; Notable for the following:    HCT 37.2 (*)    Platelets 132 (*)    All other components within normal limits  CBC - Abnormal; Notable for the following:    RBC 3.96 (*)    Hemoglobin 11.9 (*)    HCT 33.6 (*)    Platelets 135 (*)    All other components within normal limits  BASIC METABOLIC PANEL - Abnormal; Notable for the following:    Glucose, Bld 100 (*)    Creatinine, Ser 1.51 (*)    Calcium 8.2 (*)    GFR calc non Af Amer 44 (*)    GFR calc Af Amer 51 (*)    All other components within normal limits  CBC WITH DIFFERENTIAL -  Abnormal; Notable for the following:    Hemoglobin 12.8 (*)    HCT 36.5 (*)    Platelets 130 (*)    All other components within normal limits  COMPREHENSIVE METABOLIC PANEL - Abnormal; Notable for the following:    Sodium 134 (*)    Glucose, Bld 140 (*)    Creatinine, Ser 1.45 (*)    Albumin 3.0 (*)    GFR calc non Af Amer 47 (*)    GFR calc Af Amer 54 (*)    All other components within normal limits  GLUCOSE, CAPILLARY - Abnormal; Notable for the following:    Glucose-Capillary 122 (*)    All other components within normal limits  MRSA PCR SCREENING  CULTURE, BLOOD (ROUTINE X 2)  CULTURE, BLOOD (ROUTINE X 2)  LACTIC ACID, PLASMA  GLUCOSE, CAPILLARY   No results  found.   1. Sepsis due to Streptococcus, group B   2. Bacteremia due to group B Streptococcus   3. DM (diabetes mellitus)   4. Bacteremia       MDM  Hospitalist contacted for admission.        Dot Lanes, MD 02/25/13 [phone]

## 2013-02-25 NOTE — Progress Notes (Signed)
Report called to [facility], receiving RN on  5500. VSS. Transferred to 5506 via wheelchair with personal belongings.  [facility]

## 2013-02-25 NOTE — Progress Notes (Signed)
Pt transferred to 5506.  Denied any complaints.  VSS.  Oriented pt to room.

## 2013-02-25 NOTE — Consult Note (Signed)
[facility]    Date of Admission:  02/24/2013  Date of Consult:  02/25/2013  Reason for Consult: Group B streptococcal bacteremia Referring Physician: Dr. Wynelle Cleveland   HPI: Michael Trevino is an 72 y.o. male Hospital history significant for diabetes mellitus osteoarthritis, hypertension who did admitted in December with ? cellulitis of his abdominal wall where he was injecting insulin.at that time he had fever chills generalized weakness and had a fall. According to notes he had an erythematous macular rash actually involving his abdomen and back. They physician to discharge the patient actually did not feel the patient likely had cellulitis based on his exam findings at the time. The patient had been treated as an inpatient initially with cefazolin. In any case the patient improved and was ultimately discharged to home. He did sustain an injury to his index finger when trying to get out of his chair when he had severe weakness and apparently traumatized that area. He tells me that he had initially purulent drainage from the finger at that time but this is resolved. Patient presented emergent department approximately 2 days ago again with severe weakness and subjective fevers with difficulty getting out of bed. There is mention that the patient was incontinent of urine. On close questioning he states he is not incontinent of urine but he simply could not get out of his chair and eventually had to urinate and was unable to get out of the chair to go make it to the bathroom. Patient evaluated in emergent department labs were done including load cultures and urine cultures. Blood cultures and urine cultures have not grown group B streptococci although the colony counts in the urine are unconvincing for a primary urinary tract infection. He was brought bacteremia mentioned to the hospital and admitted to the hospitalist service for workup for his group B streptococcal bacteremia. Other  than the area on his abdomen worse injecting insulin and the finger that was entered he shows me know recent history of anything to suggest cellulitis or abscess. He does not have any symptoms to suggest a urinary tract infection and again I find his colony count of 40,000 colony-forming units in the urine unimpressive for patient to his bacteremic. I suspicion is like that of the admitting MD that this pt has group B streptococcal bacteremia from some other source that has more likely seeded the urine. Given hx of ssx in December I have concern he could have undiagnosed endocarditis with Group B streptococci and I agree with pursuing TEE.   Past Medical History  Diagnosis Date  . Arthritis   . Diabetes mellitus without complication   . Hypertension   . MI (myocardial infarction) 2000    History reviewed. No pertinent past surgical history.ergies:   Allergies  Allergen Reactions  . Zocor (Simvastatin - High Dose)     Unknown     Medications: I have reviewed patients current medications as documented in Epic Anti-infectives   Start     Dose/Rate Route Frequency Ordered Stop   02/25/13 1500  cefTRIAXone (ROCEPHIN) 2 g in dextrose 5 % 50 mL IVPB     2 g 100 mL/hr over 30 Minutes Intravenous Every 24 hours 02/25/13 1440     02/25/13 0600  vancomycin (VANCOCIN) 1,250 mg in sodium chloride 0.9 % 250 mL IVPB  Status:  Discontinued     1,250 mg 166.7 mL/hr over 90 Minutes Intravenous Every 12 hours 02/24/13 2142 02/25/13 1310   02/24/13 2015  gentamicin (GARAMYCIN) 1.5 mg/kg in dextrose 5 % 50 mL IVPB  Status:  Discontinued     1.5 mg/kg 100 mL/hr over 30 Minutes Intravenous Every 8 hours 02/24/13 2010 02/24/13 2015   02/24/13 2015  vancomycin (VANCOCIN) IVPB 1000 mg/200 mL premix     1,000 mg 200 mL/hr over 60 Minutes Intravenous  Once 02/24/13 2010 02/24/13 2133   02/24/13 2015  cefTRIAXone (ROCEPHIN) 1 g in dextrose 5 % 50 mL IVPB     1 g 100 mL/hr over 30 Minutes Intravenous  Once  02/24/13 2010 02/24/13 2239      Social History:  reports that he quit smoking about 28 years ago. He has never used smokeless tobacco. He reports that he does not drink alcohol. His drug history is not on file.  History reviewed. No pertinent family history.  As in HPI and primary teams notes otherwise 12 point review of systems is negative  Blood pressure 121/59, pulse 78, temperature 98.8 F (37.1 C), temperature source Oral, resp. rate 17, height 5\' 9"  (1.753 m), weight 241 lb 6.5 oz (109.5 kg), SpO2 97.00%. General: Alert and awake, oriented x3, not in any acute distress. HEENT: anicteric sclera, pupils reactive to light and accommodation, EOMI, oropharynx clear and without exudate CVS regular rate, normal r,  no murmur rubs or gallops Chest: clear to auscultation bilaterally, no wheezing, rales or rhonchi Abdomen: soft nontender, nondistended, normal bowel sounds, Extremities:2 digits on right hand with torsion of the nail and old dried blood in the nail bed. No obvious purulence Skin: no rashes Neuro: nonfocal, strength and sensation intact   Results for orders placed during the hospital encounter of 02/24/13 (from the past 48 hour(s))  LACTIC ACID, PLASMA     Status: None   Collection Time    02/24/13  8:00 PM      Result Value Range   Lactic Acid, Venous 1.5  0.5 - 2.2 mmol/L  CBC WITH DIFFERENTIAL     Status: Abnormal   Collection Time    02/24/13  8:11 PM      Result Value Range   WBC 7.2  4.0 - 10.5 K/uL   RBC 4.34  4.22 - 5.81 MIL/uL   Hemoglobin 13.1  13.0 - 17.0 g/dL   HCT 37.2 (*) 39.0 - 52.0 %   MCV 85.7  78.0 - 100.0 fL   MCH 30.2  26.0 - 34.0 pg   MCHC 35.2  30.0 - 36.0 g/dL   RDW 14.4  11.5 - 15.5 %   Platelets 132 (*) 150 - 400 K/uL   Comment: DELTA CHECK NOTED     SPECIMEN CHECKED FOR CLOTS     REPEATED TO VERIFY   Neutrophils Relative 69  43 - 77 %   Neutro Abs 5.0  1.7 - 7.7 K/uL   Lymphocytes Relative 21  12 - 46 %   Lymphs Abs 1.5  0.7 - 4.0  K/uL   Monocytes Relative 10  3 - 12 %   Monocytes Absolute 0.7  0.1 - 1.0 K/uL   Eosinophils Relative 0  0 - 5 %   Eosinophils Absolute 0.0  0.0 - 0.7 K/uL   Basophils Relative 0  0 - 1 %   Basophils Absolute 0.0  0.0 - 0.1 K/uL  GLUCOSE, CAPILLARY     Status: Abnormal   Collection Time    02/24/13  8:52 PM      Result Value Range   Glucose-Capillary 122 (*) 70 - 99  mg/dL  CBC WITH DIFFERENTIAL     Status: Abnormal   Collection Time    02/24/13  9:32 PM      Result Value Range   WBC 7.5  4.0 - 10.5 K/uL   RBC 4.24  4.22 - 5.81 MIL/uL   Hemoglobin 12.8 (*) 13.0 - 17.0 g/dL   HCT 36.5 (*) 39.0 - 52.0 %   MCV 86.1  78.0 - 100.0 fL   MCH 30.2  26.0 - 34.0 pg   MCHC 35.1  30.0 - 36.0 g/dL   RDW 14.6  11.5 - 15.5 %   Platelets 130 (*) 150 - 400 K/uL   Neutrophils Relative 72  43 - 77 %   Neutro Abs 5.4  1.7 - 7.7 K/uL   Lymphocytes Relative 18  12 - 46 %   Lymphs Abs 1.3  0.7 - 4.0 K/uL   Monocytes Relative 10  3 - 12 %   Monocytes Absolute 0.8  0.1 - 1.0 K/uL   Eosinophils Relative 0  0 - 5 %   Eosinophils Absolute 0.0  0.0 - 0.7 K/uL   Basophils Relative 0  0 - 1 %   Basophils Absolute 0.0  0.0 - 0.1 K/uL  COMPREHENSIVE METABOLIC PANEL     Status: Abnormal   Collection Time    02/24/13  9:32 PM      Result Value Range   Sodium 134 (*) 135 - 145 mEq/L   Potassium 3.6  3.5 - 5.1 mEq/L   Comment: DELTA CHECK NOTED     NO VISIBLE HEMOLYSIS   Chloride 100  96 - 112 mEq/L   CO2 22  19 - 32 mEq/L   Glucose, Bld 140 (*) 70 - 99 mg/dL   BUN 14  6 - 23 mg/dL   Creatinine, Ser 1.45 (*) 0.50 - 1.35 mg/dL   Calcium 8.6  8.4 - 10.5 mg/dL   Total Protein 7.2  6.0 - 8.3 g/dL   Albumin 3.0 (*) 3.5 - 5.2 g/dL   AST 28  0 - 37 U/L   ALT 26  0 - 53 U/L   Alkaline Phosphatase 66  39 - 117 U/L   Total Bilirubin 0.4  0.3 - 1.2 mg/dL   GFR calc non Af Amer 47 (*) >90 mL/min   GFR calc Af Amer 54 (*) >90 mL/min   Comment:            The eGFR has been calculated     using the CKD EPI  equation.     This calculation has not been     validated in all clinical     situations.     eGFR's persistently     <90 mL/min signify     possible Chronic Kidney Disease.  MRSA PCR SCREENING     Status: None   Collection Time    02/24/13 11:30 PM      Result Value Range   MRSA by PCR NEGATIVE  NEGATIVE   Comment:            The GeneXpert MRSA Assay (FDA     approved for NASAL specimens     only), is one component of a     comprehensive MRSA colonization     surveillance program. It is not     intended to diagnose MRSA     infection nor to guide or     monitor treatment for     MRSA infections.  CBC  Status: Abnormal   Collection Time    02/25/13  5:15 AM      Result Value Range   WBC 7.2  4.0 - 10.5 K/uL   RBC 3.96 (*) 4.22 - 5.81 MIL/uL   Hemoglobin 11.9 (*) 13.0 - 17.0 g/dL   HCT 33.6 (*) 39.0 - 52.0 %   MCV 84.8  78.0 - 100.0 fL   MCH 30.1  26.0 - 34.0 pg   MCHC 35.4  30.0 - 36.0 g/dL   RDW 14.6  11.5 - 15.5 %   Platelets 135 (*) 150 - 400 K/uL  BASIC METABOLIC PANEL     Status: Abnormal   Collection Time    02/25/13  5:15 AM      Result Value Range   Sodium 137  135 - 145 mEq/L   Potassium 3.7  3.5 - 5.1 mEq/L   Chloride 104  96 - 112 mEq/L   CO2 23  19 - 32 mEq/L   Glucose, Bld 100 (*) 70 - 99 mg/dL   BUN 13  6 - 23 mg/dL   Creatinine, Ser 1.51 (*) 0.50 - 1.35 mg/dL   Calcium 8.2 (*) 8.4 - 10.5 mg/dL   GFR calc non Af Amer 44 (*) >90 mL/min   GFR calc Af Amer 51 (*) >90 mL/min   Comment:            The eGFR has been calculated     using the CKD EPI equation.     This calculation has not been     validated in all clinical     situations.     eGFR's persistently     <90 mL/min signify     possible Chronic Kidney Disease.  GLUCOSE, CAPILLARY     Status: None   Collection Time    02/25/13  7:40 AM      Result Value Range   Glucose-Capillary 93  70 - 99 mg/dL   Comment 1 Notify RN     Comment 2 Documented in Chart    GLUCOSE, CAPILLARY      Status: Abnormal   Collection Time    02/25/13 11:33 AM      Result Value Range   Glucose-Capillary 153 (*) 70 - 99 mg/dL   Comment 1 Documented in Chart     Comment 2 Notify RN        Component Value Date/Time   SDES BLOOD RIGHT ARM 02/23/2013 1224   SPECREQUEST BOTTLES DRAWN AEROBIC AND ANAEROBIC 10CC 02/23/2013 1224   CULT  Value: GROUP B STREP(S.AGALACTIAE)ISOLATED Note: Gram Stain Report Called to,Read Back By and Verified With: SHANNON GANNONS 02/24/13 1045 BY SMITHERSJ 02/23/2013 1224   REPTSTATUS PENDING 02/23/2013 1224   No results found.   Recent Results (from the past 720 hour(s))  CULTURE, BLOOD (ROUTINE X 2)     Status: None   Collection Time    02/23/13 11:00 AM      Result Value Range Status   Specimen Description BLOOD RIGHT HAND   Final   Special Requests BOTTLES DRAWN AEROBIC ONLY 10CC   Final   Culture  Setup Time 02/23/2013 17:36   Final   Culture     Final   Value: GROUP B STREP(S.AGALACTIAE)ISOLATED     Note: Gram Stain Report Called to,Read Back By and Verified With: Alanson Aly RN on 02/24/13 at 04:05 by Rise Mu   Report Status PENDING   Incomplete  URINE CULTURE     Status: None  Collection Time    02/23/13 11:36 AM      Result Value Range Status   Specimen Description URINE, CLEAN CATCH   Final   Special Requests NONE   Final   Culture  Setup Time 02/23/2013 17:38   Final   Colony Count 40,000 COLONIES/ML   Final   Culture     Final   Value: GROUP B STREP(S.AGALACTIAE)ISOLATED     Note: TESTING AGAINST S. AGALACTIAE NOT ROUTINELY PERFORMED DUE TO PREDICTABILITY OF AMP/PEN/VAN SUSCEPTIBILITY.   Report Status 02/24/2013 FINAL   Final  CULTURE, BLOOD (ROUTINE X 2)     Status: None   Collection Time    02/23/13 12:24 PM      Result Value Range Status   Specimen Description BLOOD RIGHT ARM   Final   Special Requests BOTTLES DRAWN AEROBIC AND ANAEROBIC 10CC   Final   Culture  Setup Time 02/23/2013 17:36   Final   Culture     Final   Value: GROUP B  STREP(S.AGALACTIAE)ISOLATED     Note: Gram Stain Report Called to,Read Back By and Verified With: Lucila Maine 02/24/13 1045 BY SMITHERSJ   Report Status PENDING   Incomplete  MRSA PCR SCREENING     Status: None   Collection Time    02/24/13 11:30 PM      Result Value Range Status   MRSA by PCR NEGATIVE  NEGATIVE Final   Comment:            The GeneXpert MRSA Assay (FDA     approved for NASAL specimens     only), is one component of a     comprehensive MRSA colonization     surveillance program. It is not     intended to diagnose MRSA     infection nor to guide or     monitor treatment for     MRSA infections.     Impression/Recommendation  72 year old with Group B streptococcal bacteremia   #1 Group B streptococcal bacteremia: the CFU of 40k from urine seems TOO low to argue for a fuliminant UTI that caused bacteremia. I suspect his GBS bacteremia has some other occult source and Has instead  seeded his urine --continue rocephin 2 g daily --repeat blood cultures -check ESR, CRP --would get TEE --would get plain films of the hand to look for osteomyelitis in fingers  #2 Screening: will check HIV   Thank you so much for this interesting consult  [place] [place] [phone] (pager) [phone] (office) 02/25/2013, 3:52 PM  [place] 02/25/2013, 3:52 PM

## 2013-02-25 NOTE — Progress Notes (Signed)
Utilization review completed.  

## 2013-02-26 ENCOUNTER — Ambulatory Visit: Payer: Self-pay | Admitting: General Practice

## 2013-02-26 ENCOUNTER — Encounter (HOSPITAL_COMMUNITY): Payer: Self-pay | Admitting: *Deleted

## 2013-02-26 ENCOUNTER — Encounter (HOSPITAL_COMMUNITY)
Admission: EM | Disposition: A | Discharge status: Home Health | Payer: Self-pay | Source: Home / Self Care | Attending: Internal Medicine

## 2013-02-26 DIAGNOSIS — I059 Rheumatic mitral valve disease, unspecified: Secondary | ICD-10-CM

## 2013-02-26 DIAGNOSIS — R509 Fever, unspecified: Secondary | ICD-10-CM

## 2013-02-26 HISTORY — PX: TEE WITHOUT CARDIOVERSION: SHX5443

## 2013-02-26 LAB — CULTURE, BLOOD (ROUTINE X 2)

## 2013-02-26 LAB — GLUCOSE, CAPILLARY
Glucose-Capillary: 135 mg/dL — ABNORMAL HIGH (ref 70–99)
Glucose-Capillary: 131 mg/dL — ABNORMAL HIGH (ref 70–99)
Glucose-Capillary: 101 mg/dL — ABNORMAL HIGH (ref 70–99)
Glucose-Capillary: 263 mg/dL — ABNORMAL HIGH (ref 70–99)

## 2013-02-26 LAB — C-REACTIVE PROTEIN: CRP: 12.5 mg/dL — ABNORMAL HIGH (ref <0.60)

## 2013-02-26 LAB — HIV ANTIBODY (ROUTINE TESTING W REFLEX): HIV: NONREACTIVE

## 2013-02-26 LAB — SEDIMENTATION RATE: Sed Rate: 57 mm/hr — ABNORMAL HIGH (ref 0–16)

## 2013-02-26 SURGERY — ECHOCARDIOGRAM, TRANSESOPHAGEAL
Anesthesia: Moderate Sedation

## 2013-02-26 MED ORDER — FENTANYL CITRATE 0.05 MG/ML IJ SOLN
INTRAMUSCULAR | Status: AC
  Filled 2013-02-26: qty 2

## 2013-02-26 MED ORDER — FENTANYL CITRATE 0.05 MG/ML IJ SOLN
INTRAMUSCULAR | Status: DC | PRN
  Administered 2013-02-26 (×2): 25 ug via INTRAVENOUS

## 2013-02-26 MED ORDER — INSULIN GLARGINE 100 UNIT/ML ~~LOC~~ SOLN
25.0000 [IU] | Freq: Every day | SUBCUTANEOUS | Status: DC
  Administered 2013-02-27: 25 [IU] via SUBCUTANEOUS
  Filled 2013-02-26 (×2): qty 0.25

## 2013-02-26 MED ORDER — SODIUM CHLORIDE 0.9 % IV SOLN: INTRAVENOUS | Status: DC

## 2013-02-26 MED ORDER — INSULIN GLARGINE 100 UNIT/ML ~~LOC~~ SOLN: 25.0000 [IU] | Freq: Every day | SUBCUTANEOUS | Status: DC

## 2013-02-26 MED ORDER — MIDAZOLAM HCL 5 MG/ML IJ SOLN
INTRAMUSCULAR | Status: AC
  Filled 2013-02-26: qty 2

## 2013-02-26 MED ORDER — BUTAMBEN-TETRACAINE-BENZOCAINE 2-2-14 % EX AERO
INHALATION_SPRAY | CUTANEOUS | Status: DC | PRN
  Administered 2013-02-26: 2 via TOPICAL

## 2013-02-26 MED ORDER — MIDAZOLAM HCL 10 MG/2ML IJ SOLN
INTRAMUSCULAR | Status: DC | PRN
  Administered 2013-02-26: 1 mg via INTRAVENOUS
  Administered 2013-02-26: 2 mg via INTRAVENOUS
  Administered 2013-02-26: 1 mg via INTRAVENOUS
  Administered 2013-02-26: 2 mg via INTRAVENOUS

## 2013-02-26 NOTE — CV Procedure (Signed)
    Transesophageal Echocardiogram Note  Calloway Liszka [id number] Jul 30, 1941  Procedure: Transesophageal Echocardiogram Indications: evaluate for vegetations  Procedure Details Consent: Obtained Time Out: Verified patient identification, verified procedure, site/side was marked, verified correct patient position, special equipment/implants available, Radiology Safety Procedures followed,  medications/allergies/relevent history reviewed, required imaging and test results available.  Performed  Medications: Fentanyl: 50 mcg IV Versed: 6 mg IV  Left Ventrical:  Normal LV function  Mitral Valve: no vegetation, mild MR  Aortic Valve: normal structure, mild AI  Tricuspid Valve: difficult to see, no vegetation  Pulmonic Valve: normal  Left Atrium/ Left atrial appendage: normal, no thrombus  Atrial septum: normal   Aorta: mild plaque   Complications: No apparent complications Patient did tolerate procedure well.  No evidence of vegetation.    Thayer Headings, Brooke Bonito., MD, [facility] 02/26/2013, 2:36 PM

## 2013-02-26 NOTE — H&P (View-Only) (Signed)
[facility] Progress Note [place] TEAM 1 - Stepdown/ICU [name] [id number] DOB: 02/20/1941 DOA: 02/24/2013 PCP: Redge Gainer, MD  HPI/Subjective: Denies any fever or chills. Feels better   Brief narrative: Michael Trevino is a 72 y.o. male who has been feeling ill since about thurs of last week (3-4 days). Symptoms include fever, chills, body aches, myalgias. Similar symptoms in December and was admitted for cellulitus of abdomengroup at that time. Patient seen in ED yesterday, fever of 103.6, blood cultures were drawn at that time and patient discharged with f/u scheduled on Wed with PCP. Today blood cultures (taken yesterday) came back positive x2 for GPC in chains, and his urine culture showed 40k CFU of GBS (S. Agalactiae), patient was called back by ED and told to return to ED.  In ED patient noted to be febrile, tachycardic, and slightly tachypnic, blood pressures were stable. Given Rocephin and vancomycin and hospitalist asked to admit for bacteremia with sepsis.  Assessment/Plan: Principal Problem:    Bacteremia due to group B Streptococcus -On high-dose Rocephin, appreciate ID help. - Recommended to obtain TEE, cardiology called today.  Active Problems:   UTI (urinary tract infection) - per ID, this is likely secondary to the bacteremia    DM (diabetes mellitus) - sugars well controlled on current regimen    CKD (chronic kidney disease) - stable  HTN - stable on current meds  Hypothyroid - cont levothyroxine    Code Status: full code Family Communication: with wife Disposition Plan: Remains inpatient  Consultants: ID  Procedures: none  Antibiotics: Vanc 4/14-4/15 Rocephin 4/14>>  DVT prophylaxis: heparin     Objective: Blood pressure 133/67, pulse 81, temperature 98.5 F (36.9 C), temperature source Oral, resp. rate 20, height 5\' 9"  (1.753 m), weight 109.77 kg (242 lb), SpO2 93.00%.  Intake/Output Summary (Last 24 hours)  at 02/26/13 1036 Last data filed at 02/26/13 0500  Gross per 24 hour  Intake   2540 ml  Output   3050 ml  Net   -510 ml     Exam: General: No acute respiratory distress Lungs: Clear to auscultation bilaterally without wheezes or crackles Cardiovascular: Regular rate and rhythm without murmur gallop or rub normal S1 and S2 Abdomen: Nontender, nondistended, soft, bowel sounds positive, no rebound, no ascites, no appreciable mass Extremities: No significant cyanosis, clubbing, or edema bilateral lower extremities  Data Reviewed: Basic Metabolic Panel:  Recent Labs Lab 02/23/13 1100 02/24/13 2132 02/25/13 0515  NA 136 134* 137  K 5.2* 3.6 3.7  CL 103 100 104  CO2 23 22 23   GLUCOSE 145* 140* 100*  BUN 11 14 13   CREATININE 1.24 1.45* 1.51*  CALCIUM 8.7 8.6 8.2*   Liver Function Tests:  Recent Labs Lab 02/23/13 1100 02/24/13 2132  AST 47* 28  ALT 26 26  ALKPHOS 69 66  BILITOT 0.5 0.4  PROT 7.8 7.2  ALBUMIN 3.2* 3.0*   No results found for this basename: LIPASE, AMYLASE,  in the last 168 hours No results found for this basename: AMMONIA,  in the last 168 hours CBC:  Recent Labs Lab 02/23/13 1100 02/24/13 2011 02/24/13 2132 02/25/13 0515  WBC 7.4 7.2 7.5 7.2  NEUTROABS 5.7 5.0 5.4  --   HGB 13.6 13.1 12.8* 11.9*  HCT 37.6* 37.2* 36.5* 33.6*  MCV 85.5 85.7 86.1 84.8  PLT 185 132* 130* 135*   Cardiac Enzymes: No results found for this basename: CKTOTAL, CKMB, CKMBINDEX, TROPONINI,  in the last 168  hours BNP (last 3 results) No results found for this basename: PROBNP,  in the last 8760 hours CBG:  Recent Labs Lab 02/25/13 0740 02/25/13 1133 02/25/13 1653 02/25/13 2157 02/26/13 0750  GLUCAP 93 153* 142* 184* 135*    Recent Results (from the past 240 hour(s))  CULTURE, BLOOD (ROUTINE X 2)     Status: None   Collection Time    02/23/13 11:00 AM      Result Value Range Status   Specimen Description BLOOD RIGHT HAND   Final   Special Requests  BOTTLES DRAWN AEROBIC ONLY 10CC   Final   Culture  Setup Time 02/23/2013 17:36   Final   Culture     Final   Value: GROUP B STREP(S.AGALACTIAE)ISOLATED     Note: SUSCEPTIBILITIES PERFORMED ON PREVIOUS CULTURE WITHIN THE LAST 5 DAYS.     Note: Gram Stain Report Called to,Read Back By and Verified With: Alanson Aly RN on 02/24/13 at 04:05 by Rise Mu   Report Status 02/26/2013 FINAL   Final  URINE CULTURE     Status: None   Collection Time    02/23/13 11:36 AM      Result Value Range Status   Specimen Description URINE, CLEAN CATCH   Final   Special Requests NONE   Final   Culture  Setup Time 02/23/2013 17:38   Final   Colony Count 40,000 COLONIES/ML   Final   Culture     Final   Value: GROUP B STREP(S.AGALACTIAE)ISOLATED     Note: TESTING AGAINST S. AGALACTIAE NOT ROUTINELY PERFORMED DUE TO PREDICTABILITY OF AMP/PEN/VAN SUSCEPTIBILITY.   Report Status 02/24/2013 FINAL   Final  CULTURE, BLOOD (ROUTINE X 2)     Status: None   Collection Time    02/23/13 12:24 PM      Result Value Range Status   Specimen Description BLOOD RIGHT ARM   Final   Special Requests BOTTLES DRAWN AEROBIC AND ANAEROBIC 10CC   Final   Culture  Setup Time 02/23/2013 17:36   Final   Culture     Final   Value: GROUP B STREP(S.AGALACTIAE)ISOLATED     Note: Gram Stain Report Called to,Read Back By and Verified With: Lucila Maine 02/24/13 1045 BY SMITHERSJ   Report Status 02/26/2013 FINAL   Final   Organism ID, Bacteria GROUP B STREP(S.AGALACTIAE)ISOLATED   Final  CULTURE, BLOOD (ROUTINE X 2)     Status: None   Collection Time    02/24/13  9:50 PM      Result Value Range Status   Specimen Description BLOOD LEFT ARM   Final   Special Requests BOTTLES DRAWN AEROBIC AND ANAEROBIC 10CC EACH   Final   Culture  Setup Time 02/25/2013 06:53   Final   Culture     Final   Value:        BLOOD CULTURE RECEIVED NO GROWTH TO DATE CULTURE WILL BE HELD FOR 5 DAYS BEFORE ISSUING A FINAL NEGATIVE REPORT   Report Status PENDING    Incomplete  CULTURE, BLOOD (ROUTINE X 2)     Status: None   Collection Time    02/24/13 10:11 PM      Result Value Range Status   Specimen Description BLOOD THUMB LEFT   Final   Special Requests     Final   Value: BOTTLES DRAWN AEROBIC AND ANAEROBIC 5CC PATIENT ON FOLLOWING VANCO   Culture  Setup Time 02/25/2013 06:54   Final   Culture  Final   Value:        BLOOD CULTURE RECEIVED NO GROWTH TO DATE CULTURE WILL BE HELD FOR 5 DAYS BEFORE ISSUING A FINAL NEGATIVE REPORT   Report Status PENDING   Incomplete  MRSA PCR SCREENING     Status: None   Collection Time    02/24/13 11:30 PM      Result Value Range Status   MRSA by PCR NEGATIVE  NEGATIVE Final   Comment:            The GeneXpert MRSA Assay (FDA     approved for NASAL specimens     only), is one component of a     comprehensive MRSA colonization     surveillance program. It is not     intended to diagnose MRSA     infection nor to guide or     monitor treatment for     MRSA infections.  CULTURE, BLOOD (ROUTINE X 2)     Status: None   Collection Time    02/25/13  3:18 PM      Result Value Range Status   Specimen Description BLOOD RIGHT HAND   Final   Special Requests BOTTLES DRAWN AEROBIC AND ANAEROBIC 10CC   Final   Culture  Setup Time 02/25/2013 21:58   Final   Culture     Final   Value:        BLOOD CULTURE RECEIVED NO GROWTH TO DATE CULTURE WILL BE HELD FOR 5 DAYS BEFORE ISSUING A FINAL NEGATIVE REPORT   Report Status PENDING   Incomplete  CULTURE, BLOOD (ROUTINE X 2)     Status: None   Collection Time    02/25/13  3:25 PM      Result Value Range Status   Specimen Description BLOOD LEFT HAND   Final   Special Requests     Final   Value: BOTTLES DRAWN AEROBIC AND ANAEROBIC AER 10CC, ANA 8CC   Culture  Setup Time 02/25/2013 21:58   Final   Culture     Final   Value:        BLOOD CULTURE RECEIVED NO GROWTH TO DATE CULTURE WILL BE HELD FOR 5 DAYS BEFORE ISSUING A FINAL NEGATIVE REPORT   Report Status PENDING    Incomplete     Studies:  Recent x-ray studies have been reviewed in detail by the Attending Physician  Scheduled Meds:  Scheduled Meds: . amLODipine  10 mg Oral Daily  . aspirin  325 mg Oral Daily  . benazepril  40 mg Oral Daily  . cefTRIAXone (ROCEPHIN)  IV  2 g Intravenous Q24H  . ezetimibe  10 mg Oral Daily  . furosemide  40 mg Oral BID  . heparin  5,000 Units Subcutaneous Q8H  . insulin aspart  0-20 Units Subcutaneous TID WC  . insulin glargine  25 Units Subcutaneous Daily  . levothyroxine  100 mcg Oral QAC breakfast  . metoprolol  100 mg Oral BID  . niacin  1,000 mg Oral QHS  . sodium chloride  3 mL Intravenous Q12H   Continuous Infusions: . sodium chloride 1,000 mL (02/26/13 0927)    Time spent on care of this patient: 35 min   Jovita Persing A, MD  [facility] Office  [phone] Pager - Text Page per Shea Evans as per below:  On-Call/Text Page:      [email or web address]      password [email or web address]  If 7PM-7AM, please contact night-coverage [email or web address] Password [facility] 02/26/2013, 10:36 AM  LOS: 2 days

## 2013-02-26 NOTE — Progress Notes (Signed)
[facility] Progress Note [place] TEAM 1 - Stepdown/ICU Virl Donson [id number] DOB: [birth year] DOA: 02/24/2013 PCP: Redge Gainer, MD  HPI/Subjective: Denies any fever or chills. Feels better   Brief narrative: Michael Trevino is a 72 y.o. male who has been feeling ill since about thurs of last week (3-4 days). Symptoms include fever, chills, body aches, myalgias. Similar symptoms in December and was admitted for cellulitus of abdomengroup at that time. Patient seen in ED yesterday, fever of 103.6, blood cultures were drawn at that time and patient discharged with f/u scheduled on Wed with PCP. Today blood cultures (taken yesterday) came back positive x2 for GPC in chains, and his urine culture showed 40k CFU of GBS (S. Agalactiae), patient was called back by ED and told to return to ED.  In ED patient noted to be febrile, tachycardic, and slightly tachypnic, blood pressures were stable. Given Rocephin and vancomycin and hospitalist asked to admit for bacteremia with sepsis.  Assessment/Plan: Principal Problem:    Bacteremia due to group B Streptococcus -On high-dose Rocephin, appreciate ID help. - Recommended to obtain TEE, cardiology called today.  Active Problems:   UTI (urinary tract infection) - per ID, this is likely secondary to the bacteremia    DM (diabetes mellitus) - sugars well controlled on current regimen    CKD (chronic kidney disease) - stable  HTN - stable on current meds  Hypothyroid - cont levothyroxine    Code Status: full code Family Communication: with wife Disposition Plan: Remains inpatient  Consultants: ID  Procedures: none  Antibiotics: Vanc 4/14-4/15 Rocephin 4/14>>  DVT prophylaxis: heparin     Objective: Blood pressure 133/67, pulse 81, temperature 98.5 F (36.9 C), temperature source Oral, resp. rate 20, height 5\' 9"  (1.753 m), weight 109.77 kg (242 lb), SpO2 93.00%.  Intake/Output Summary (Last 24 hours)  at 02/26/13 1036 Last data filed at 02/26/13 0500  Gross per 24 hour  Intake   2540 ml  Output   3050 ml  Net   -510 ml     Exam: General: No acute respiratory distress Lungs: Clear to auscultation bilaterally without wheezes or crackles Cardiovascular: Regular rate and rhythm without murmur gallop or rub normal S1 and S2 Abdomen: Nontender, nondistended, soft, bowel sounds positive, no rebound, no ascites, no appreciable mass Extremities: No significant cyanosis, clubbing, or edema bilateral lower extremities  Data Reviewed: Basic Metabolic Panel:  Recent Labs Lab 02/23/13 1100 02/24/13 2132 02/25/13 0515  NA 136 134* 137  K 5.2* 3.6 3.7  CL 103 100 104  CO2 23 22 23   GLUCOSE 145* 140* 100*  BUN 11 14 13   CREATININE 1.24 1.45* 1.51*  CALCIUM 8.7 8.6 8.2*   Liver Function Tests:  Recent Labs Lab 02/23/13 1100 02/24/13 2132  AST 47* 28  ALT 26 26  ALKPHOS 69 66  BILITOT 0.5 0.4  PROT 7.8 7.2  ALBUMIN 3.2* 3.0*   No results found for this basename: LIPASE, AMYLASE,  in the last 168 hours No results found for this basename: AMMONIA,  in the last 168 hours CBC:  Recent Labs Lab 02/23/13 1100 02/24/13 2011 02/24/13 2132 02/25/13 0515  WBC 7.4 7.2 7.5 7.2  NEUTROABS 5.7 5.0 5.4  --   HGB 13.6 13.1 12.8* 11.9*  HCT 37.6* 37.2* 36.5* 33.6*  MCV 85.5 85.7 86.1 84.8  PLT 185 132* 130* 135*   Cardiac Enzymes: No results found for this basename: CKTOTAL, CKMB, CKMBINDEX, TROPONINI,  in the last 168  hours BNP (last 3 results) No results found for this basename: PROBNP,  in the last 8760 hours CBG:  Recent Labs Lab 02/25/13 0740 02/25/13 1133 02/25/13 1653 02/25/13 2157 02/26/13 0750  GLUCAP 93 153* 142* 184* 135*    Recent Results (from the past 240 hour(s))  CULTURE, BLOOD (ROUTINE X 2)     Status: None   Collection Time    02/23/13 11:00 AM      Result Value Range Status   Specimen Description BLOOD RIGHT HAND   Final   Special Requests  BOTTLES DRAWN AEROBIC ONLY 10CC   Final   Culture  Setup Time 02/23/2013 17:36   Final   Culture     Final   Value: GROUP B STREP(S.AGALACTIAE)ISOLATED     Note: SUSCEPTIBILITIES PERFORMED ON PREVIOUS CULTURE WITHIN THE LAST 5 DAYS.     Note: Gram Stain Report Called to,Read Back By and Verified With: Alanson Aly RN on 02/24/13 at 04:05 by Rise Mu   Report Status 02/26/2013 FINAL   Final  URINE CULTURE     Status: None   Collection Time    02/23/13 11:36 AM      Result Value Range Status   Specimen Description URINE, CLEAN CATCH   Final   Special Requests NONE   Final   Culture  Setup Time 02/23/2013 17:38   Final   Colony Count 40,000 COLONIES/ML   Final   Culture     Final   Value: GROUP B STREP(S.AGALACTIAE)ISOLATED     Note: TESTING AGAINST S. AGALACTIAE NOT ROUTINELY PERFORMED DUE TO PREDICTABILITY OF AMP/PEN/VAN SUSCEPTIBILITY.   Report Status 02/24/2013 FINAL   Final  CULTURE, BLOOD (ROUTINE X 2)     Status: None   Collection Time    02/23/13 12:24 PM      Result Value Range Status   Specimen Description BLOOD RIGHT ARM   Final   Special Requests BOTTLES DRAWN AEROBIC AND ANAEROBIC 10CC   Final   Culture  Setup Time 02/23/2013 17:36   Final   Culture     Final   Value: GROUP B STREP(S.AGALACTIAE)ISOLATED     Note: Gram Stain Report Called to,Read Back By and Verified With: Lucila Maine 02/24/13 1045 BY SMITHERSJ   Report Status 02/26/2013 FINAL   Final   Organism ID, Bacteria GROUP B STREP(S.AGALACTIAE)ISOLATED   Final  CULTURE, BLOOD (ROUTINE X 2)     Status: None   Collection Time    02/24/13  9:50 PM      Result Value Range Status   Specimen Description BLOOD LEFT ARM   Final   Special Requests BOTTLES DRAWN AEROBIC AND ANAEROBIC 10CC EACH   Final   Culture  Setup Time 02/25/2013 06:53   Final   Culture     Final   Value:        BLOOD CULTURE RECEIVED NO GROWTH TO DATE CULTURE WILL BE HELD FOR 5 DAYS BEFORE ISSUING A FINAL NEGATIVE REPORT   Report Status PENDING    Incomplete  CULTURE, BLOOD (ROUTINE X 2)     Status: None   Collection Time    02/24/13 10:11 PM      Result Value Range Status   Specimen Description BLOOD THUMB LEFT   Final   Special Requests     Final   Value: BOTTLES DRAWN AEROBIC AND ANAEROBIC 5CC PATIENT ON FOLLOWING VANCO   Culture  Setup Time 02/25/2013 06:54   Final   Culture  Final   Value:        BLOOD CULTURE RECEIVED NO GROWTH TO DATE CULTURE WILL BE HELD FOR 5 DAYS BEFORE ISSUING A FINAL NEGATIVE REPORT   Report Status PENDING   Incomplete  MRSA PCR SCREENING     Status: None   Collection Time    02/24/13 11:30 PM      Result Value Range Status   MRSA by PCR NEGATIVE  NEGATIVE Final   Comment:            The GeneXpert MRSA Assay (FDA     approved for NASAL specimens     only), is one component of a     comprehensive MRSA colonization     surveillance program. It is not     intended to diagnose MRSA     infection nor to guide or     monitor treatment for     MRSA infections.  CULTURE, BLOOD (ROUTINE X 2)     Status: None   Collection Time    02/25/13  3:18 PM      Result Value Range Status   Specimen Description BLOOD RIGHT HAND   Final   Special Requests BOTTLES DRAWN AEROBIC AND ANAEROBIC 10CC   Final   Culture  Setup Time 02/25/2013 21:58   Final   Culture     Final   Value:        BLOOD CULTURE RECEIVED NO GROWTH TO DATE CULTURE WILL BE HELD FOR 5 DAYS BEFORE ISSUING A FINAL NEGATIVE REPORT   Report Status PENDING   Incomplete  CULTURE, BLOOD (ROUTINE X 2)     Status: None   Collection Time    02/25/13  3:25 PM      Result Value Range Status   Specimen Description BLOOD LEFT HAND   Final   Special Requests     Final   Value: BOTTLES DRAWN AEROBIC AND ANAEROBIC AER 10CC, ANA 8CC   Culture  Setup Time 02/25/2013 21:58   Final   Culture     Final   Value:        BLOOD CULTURE RECEIVED NO GROWTH TO DATE CULTURE WILL BE HELD FOR 5 DAYS BEFORE ISSUING A FINAL NEGATIVE REPORT   Report Status PENDING    Incomplete     Studies:  Recent x-ray studies have been reviewed in detail by the Attending Physician  Scheduled Meds:  Scheduled Meds: . amLODipine  10 mg Oral Daily  . aspirin  325 mg Oral Daily  . benazepril  40 mg Oral Daily  . cefTRIAXone (ROCEPHIN)  IV  2 g Intravenous Q24H  . ezetimibe  10 mg Oral Daily  . furosemide  40 mg Oral BID  . heparin  5,000 Units Subcutaneous Q8H  . insulin aspart  0-20 Units Subcutaneous TID WC  . insulin glargine  25 Units Subcutaneous Daily  . levothyroxine  100 mcg Oral QAC breakfast  . metoprolol  100 mg Oral BID  . niacin  1,000 mg Oral QHS  . sodium chloride  3 mL Intravenous Q12H   Continuous Infusions: . sodium chloride 1,000 mL (02/26/13 0927)    Time spent on care of this patient: 35 min   Khiara Shuping A, MD  [facility] Office  [phone] Pager - Text Page per Shea Evans as per below:  On-Call/Text Page:      [email or web address]      password [email or web address]  If 7PM-7AM, please contact night-coverage [email or web address] Password [facility] 02/26/2013, 10:36 AM  LOS: 2 days

## 2013-02-26 NOTE — Interval H&P Note (Signed)
History and Physical Interval Note:  02/26/2013 2:11 PM  Michael Trevino  has presented today for surgery, with the diagnosis of r/o veg  The various methods of treatment have been discussed with the patient and family. After consideration of risks, benefits and other options for treatment, the patient has consented to  Procedure(s): TRANSESOPHAGEAL ECHOCARDIOGRAM (TEE) (N/A) as a surgical intervention .  The patient's history has been reviewed, patient examined, no change in status, stable for surgery.  I have reviewed the patient's chart and labs.  Questions were answered to the patient's satisfaction.     Darden Amber.

## 2013-02-26 NOTE — Progress Notes (Signed)
  Echocardiogram Echocardiogram Transesophageal has been performed.  Philipp Deputy 02/26/2013, 3:19 PM

## 2013-02-26 NOTE — Plan of Care (Signed)
Problem: Phase I Progression Outcomes Goal: Initial discharge plan identified Outcome: Completed/Met Date Met:  02/26/13 Return home with wife

## 2013-02-27 ENCOUNTER — Encounter (HOSPITAL_COMMUNITY): Payer: Self-pay | Admitting: Cardiovascular Disease

## 2013-02-27 LAB — GLUCOSE, CAPILLARY
Glucose-Capillary: 154 mg/dL — ABNORMAL HIGH (ref 70–99)
Glucose-Capillary: 205 mg/dL — ABNORMAL HIGH (ref 70–99)

## 2013-02-27 LAB — BASIC METABOLIC PANEL
BUN: 12 mg/dL (ref 6–23)
CO2: 25 mEq/L (ref 19–32)
Calcium: 8.5 mg/dL (ref 8.4–10.5)
Chloride: 106 mEq/L (ref 96–112)
Creatinine, Ser: 1.28 mg/dL (ref 0.50–1.35)
GFR calc Af Amer: 63 mL/min — ABNORMAL LOW (ref >90)
GFR calc non Af Amer: 54 mL/min — ABNORMAL LOW (ref >90)
Glucose, Bld: 161 mg/dL — ABNORMAL HIGH (ref 70–99)
Potassium: 3.7 mEq/L (ref 3.5–5.1)
Sodium: 140 mEq/L (ref 135–145)

## 2013-02-27 MED ORDER — SODIUM CHLORIDE 0.9 % IJ SOLN
10.0000 mL | INTRAMUSCULAR | Status: DC | PRN
  Administered 2013-02-27: 10 mL

## 2013-02-27 MED ORDER — HEPARIN SOD (PORK) LOCK FLUSH 100 UNIT/ML IV SOLN
250.0000 [IU] | INTRAVENOUS | Status: AC | PRN
  Administered 2013-02-27: 500 [IU]

## 2013-02-27 MED ORDER — DEXTROSE 5 % IV SOLN: 2.0000 g | INTRAVENOUS | Status: DC

## 2013-02-27 NOTE — Progress Notes (Signed)
Peripherally Inserted Central Catheter/Midline Placement  The IV Nurse has discussed with the patient and/or persons authorized to consent for the patient, the purpose of this procedure and the potential benefits and risks involved with this procedure.  The benefits include less needle sticks, lab draws from the catheter and patient may be discharged home with the catheter.  Risks include, but not limited to, infection, bleeding, blood clot (thrombus formation), and puncture of an artery; nerve damage and irregular heat beat.  Alternatives to this procedure were also discussed.  PICC/Midline Placement Documentation        Michael Trevino 02/27/2013, 12:16 PM

## 2013-02-27 NOTE — Care Management Note (Signed)
    Page 1 of 1   02/27/2013     4:10:36 PM   CARE MANAGEMENT NOTE 02/27/2013  Patient:  Michael Trevino, Michael Trevino   Account Number:  [id number]  Date Initiated:  02/25/2013  Documentation initiated by:  Marvetta Gibbons  Subjective/Objective Assessment:   Pt admitted with +BC- bacteremia with sepsis     Action/Plan:   PTA pt lived at home- independent- NCM to follow for pt progresssion and d/c needs   Anticipated DC Date:  02/28/2013   Anticipated DC Plan:  [place]  CM consult      Choice offered to / List presented to:  C-3 Spouse        HH arranged  HH-1 RN  IV Antibiotics      [place].   Status of service:  Completed, signed off Medicare Important Message given?   (If response is "NO", the following Medicare IM given date fields will be blank) Date Medicare IM given:   Date Additional Medicare IM given:    Discharge Disposition:  [place]  Per UR Regulation:  Reviewed for med. necessity/level of care/duration of stay  If discussed at [place] of Stay Meetings, dates discussed:    Comments:  02/27/13 Spoke with patient and his wife about [place] for home IV antibiotics. They selected [facility] from the [facility] list of agencies. Contacted Donna at [facility] and set up [facility] and IV antibiotics. Service will start 02/28/13. Patient will receive dose of IV rocephin prior to discharge today. Fuller Plan RN, BSN, CCM

## 2013-02-27 NOTE — Progress Notes (Signed)
[facility] PROGRESS NOTE  Michael Trevino [id number] DOB: 10/11/1941 DOA: 02/24/2013 PCP: Redge Gainer, MD  Assessment/Plan: 1. Bacteriemia/Sepsis - vancomycin, rocephin IV for group strept B - TEE shows no vegetation, mild AI and MR, but no signs of endocarditis as a possible site of infection - patient commented about bite on left earlobe that occurred a couple days prior to admission to the hospital that resulted in swelling.  - recommend PT/OT eval and switch to po abx prior to d/c   2. DM2 - Goal < 200 for random plasma glucose level - novoLOG injection 0-20 units - Lantus injection 25 units  3. HTN/CAD - amlodipine 10MG  tablet - benazepril 40 Mg tablet -metoprolol  100 MG tablet -furosemide 40 MG tablet - aspirin EC tablet 325 MG  4. Dyslipidemia - rosuvastatin 20 MG tablet - ezetimibe 10 MG tablet - niacin 1000mg  CR tablet  5. UTI - vancyomycin and rocephin should cover UTI secondary to bacteremia  6. Thyroid -  Levothyroxine 100 mcg  7. DVT prophylaxis - haparin injection 5,000 units     Code Status: full Family Communication: no family member present Disposition Plan: inpatient   Consultants:  none  Procedures:  TEE (02/26/2013)  Antibiotics:  Vancomycin (02/27/2013)  Ceftriaxone  (02/27/2013)  HPI/Subjective  Denies any fever or chills. Feels better.   Michael Trevino is a 72 year old male admitted to hospital for 4-5 days of bacteriemia and sepsis with a history cellulitis at the beginning of the year and dx of DM2, HTN, dyslipidemia, CKD, old MI, and CAD.  Today he states that he feels fine. Patient denies fever, chills, headache, abdominal pain. His results from his TEE were negative for vegetation r/o endocarditis as a possible cause of sepsis. He mentioned that he had a bite on his left earlobe that lead to swelling prior to his admission to the hospital for chills, fever, myalgias. On inspection of the ear, it was atraumatic,  asymptomatic. Given rocephin and vancomycin and will continue meds po outpatient.   Objective: Filed Vitals:   02/26/13 2128 02/26/13 2204 02/27/13 0558 02/27/13 1011  BP: 137/63 148/63 128/70 127/50  Pulse: 86 86 72 80  Temp: 98.4 F (36.9 C)  98.1 F (36.7 C)   TempSrc: Oral  Oral   Resp: 22  24   Height:      Weight:      SpO2: 92%  93%     Intake/Output Summary (Last 24 hours) at 02/27/13 1033 Last data filed at 02/27/13 0923  Gross per 24 hour  Intake 1546.25 ml  Output   3470 ml  Net -1923.75 ml   Filed Weights   02/24/13 2029 02/24/13 2315 02/25/13 1858  Weight: 106.595 kg (235 lb) 109.5 kg (241 lb 6.5 oz) 109.77 kg (242 lb)    Exam:   General: No acute respiratory distress  Cardiovascular: normal S1 and S2, mild systolic murmer heard, no lateral distention of PMI, regular rate and rhythm without gallop or rub  Respiratory: Clear to auscultation b/l without wheezes and crackles  Abdomen: nontender to palpation, non distended, soft without rebound, ascites or mass  Musculoskeletal: equal strength bilaterally in extremities. No signs of clubbing, cyanosis, or edema  Data Reviewed: Basic Metabolic Panel:  Recent Labs Lab 02/23/13 1100 02/24/13 2132 02/25/13 0515 02/27/13 0750  NA 136 134* 137 140  K 5.2* 3.6 3.7 3.7  CL 103 100 104 106  CO2 23 22 23 25   GLUCOSE 145* 140* 100* 161*  BUN  11 14 13 12   CREATININE 1.24 1.45* 1.51* 1.28  CALCIUM 8.7 8.6 8.2* 8.5   Liver Function Tests:  Recent Labs Lab 02/23/13 1100 02/24/13 2132  AST 47* 28  ALT 26 26  ALKPHOS 69 66  BILITOT 0.5 0.4  PROT 7.8 7.2  ALBUMIN 3.2* 3.0*   No results found for this basename: LIPASE, AMYLASE,  in the last 168 hours No results found for this basename: AMMONIA,  in the last 168 hours CBC:  Recent Labs Lab 02/23/13 1100 02/24/13 2011 02/24/13 2132 02/25/13 0515  WBC 7.4 7.2 7.5 7.2  NEUTROABS 5.7 5.0 5.4  --   HGB 13.6 13.1 12.8* 11.9*  HCT 37.6* 37.2* 36.5*  33.6*  MCV 85.5 85.7 86.1 84.8  PLT 185 132* 130* 135*   Cardiac Enzymes: No results found for this basename: CKTOTAL, CKMB, CKMBINDEX, TROPONINI,  in the last 168 hours BNP (last 3 results) No results found for this basename: PROBNP,  in the last 8760 hours CBG:  Recent Labs Lab 02/26/13 0750 02/26/13 1202 02/26/13 1636 02/26/13 2129 02/27/13 0733  GLUCAP 135* 131* 101* 263* 154*    Recent Results (from the past 240 hour(s))  CULTURE, BLOOD (ROUTINE X 2)     Status: None   Collection Time    02/23/13 11:00 AM      Result Value Range Status   Specimen Description BLOOD RIGHT HAND   Final   Special Requests BOTTLES DRAWN AEROBIC ONLY 10CC   Final   Culture  Setup Time 02/23/2013 17:36   Final   Culture     Final   Value: GROUP B STREP(S.AGALACTIAE)ISOLATED     Note: SUSCEPTIBILITIES PERFORMED ON PREVIOUS CULTURE WITHIN THE LAST 5 DAYS.     Note: Gram Stain Report Called to,Read Back By and Verified With: Alanson Aly RN on 02/24/13 at 04:05 by Rise Mu   Report Status 02/26/2013 FINAL   Final  URINE CULTURE     Status: None   Collection Time    02/23/13 11:36 AM      Result Value Range Status   Specimen Description URINE, CLEAN CATCH   Final   Special Requests NONE   Final   Culture  Setup Time 02/23/2013 17:38   Final   Colony Count 40,000 COLONIES/ML   Final   Culture     Final   Value: GROUP B STREP(S.AGALACTIAE)ISOLATED     Note: TESTING AGAINST S. AGALACTIAE NOT ROUTINELY PERFORMED DUE TO PREDICTABILITY OF AMP/PEN/VAN SUSCEPTIBILITY.   Report Status 02/24/2013 FINAL   Final  CULTURE, BLOOD (ROUTINE X 2)     Status: None   Collection Time    02/23/13 12:24 PM      Result Value Range Status   Specimen Description BLOOD RIGHT ARM   Final   Special Requests BOTTLES DRAWN AEROBIC AND ANAEROBIC 10CC   Final   Culture  Setup Time 02/23/2013 17:36   Final   Culture     Final   Value: GROUP B STREP(S.AGALACTIAE)ISOLATED     Note: Gram Stain Report Called to,Read Back By  and Verified With: Lucila Maine 02/24/13 1045 BY SMITHERSJ   Report Status 02/26/2013 FINAL   Final   Organism ID, Bacteria GROUP B STREP(S.AGALACTIAE)ISOLATED   Final  CULTURE, BLOOD (ROUTINE X 2)     Status: None   Collection Time    02/24/13  9:50 PM      Result Value Range Status   Specimen Description BLOOD LEFT ARM   Final  Special Requests BOTTLES DRAWN AEROBIC AND ANAEROBIC 10CC EACH   Final   Culture  Setup Time 03/21/13 06:53   Final   Culture     Final   Value:        BLOOD CULTURE RECEIVED NO GROWTH TO DATE CULTURE WILL BE HELD FOR 5 DAYS BEFORE ISSUING A FINAL NEGATIVE REPORT   Report Status PENDING   Incomplete  CULTURE, BLOOD (ROUTINE X 2)     Status: None   Collection Time    02/24/13 10:11 PM      Result Value Range Status   Specimen Description BLOOD THUMB LEFT   Final   Special Requests     Final   Value: BOTTLES DRAWN AEROBIC AND ANAEROBIC 5CC PATIENT ON FOLLOWING VANCO   Culture  Setup Time 2013/03/21 06:54   Final   Culture     Final   Value:        BLOOD CULTURE RECEIVED NO GROWTH TO DATE CULTURE WILL BE HELD FOR 5 DAYS BEFORE ISSUING A FINAL NEGATIVE REPORT   Report Status PENDING   Incomplete  MRSA PCR SCREENING     Status: None   Collection Time    02/24/13 11:30 PM      Result Value Range Status   MRSA by PCR NEGATIVE  NEGATIVE Final   Comment:            The GeneXpert MRSA Assay (FDA     approved for NASAL specimens     only), is one component of a     comprehensive MRSA colonization     surveillance program. It is not     intended to diagnose MRSA     infection nor to guide or     monitor treatment for     MRSA infections.  CULTURE, BLOOD (ROUTINE X 2)     Status: None   Collection Time    Mar 21, 2013  3:18 PM      Result Value Range Status   Specimen Description BLOOD RIGHT HAND   Final   Special Requests BOTTLES DRAWN AEROBIC AND ANAEROBIC 10CC   Final   Culture  Setup Time Mar 21, 2013 21:58   Final   Culture     Final   Value:         BLOOD CULTURE RECEIVED NO GROWTH TO DATE CULTURE WILL BE HELD FOR 5 DAYS BEFORE ISSUING A FINAL NEGATIVE REPORT   Report Status PENDING   Incomplete  CULTURE, BLOOD (ROUTINE X 2)     Status: None   Collection Time    03-21-2013  3:25 PM      Result Value Range Status   Specimen Description BLOOD LEFT HAND   Final   Special Requests     Final   Value: BOTTLES DRAWN AEROBIC AND ANAEROBIC AER 10CC, ANA 8CC   Culture  Setup Time Mar 21, 2013 21:58   Final   Culture     Final   Value:        BLOOD CULTURE RECEIVED NO GROWTH TO DATE CULTURE WILL BE HELD FOR 5 DAYS BEFORE ISSUING A FINAL NEGATIVE REPORT   Report Status PENDING   Incomplete     Studies: Dg Hand Complete Right  03/21/2013  *RADIOLOGY REPORT*  Clinical Data: Trauma to right middle and ring fingers in December, question osteomyelitis  RIGHT HAND - COMPLETE 3+ VIEW  Comparison: None  Findings: Diffuse osseous demineralization. Minimal scattered narrowing of interphalangeal joints. Artifacts from IV catheter and tubing. No acute  fracture, dislocation or bone destruction. Specifically no evidence of osteomyelitis identified. Fingers superimposed on lateral view limiting assessment.  IMPRESSION: No acute osseous abnormalities.   Original Report Authenticated By: Lavonia Dana, M.D.     Scheduled Meds: . amLODipine  10 mg Oral Daily  . aspirin  325 mg Oral Daily  . benazepril  40 mg Oral Daily  . cefTRIAXone (ROCEPHIN)  IV  2 g Intravenous Q24H  . ezetimibe  10 mg Oral Daily  . furosemide  40 mg Oral BID  . heparin  5,000 Units Subcutaneous Q8H  . insulin aspart  0-20 Units Subcutaneous TID WC  . insulin glargine  25 Units Subcutaneous Daily  . levothyroxine  100 mcg Oral QAC breakfast  . metoprolol  100 mg Oral BID  . niacin  1,000 mg Oral QHS  . sodium chloride  3 mL Intravenous Q12H   Continuous Infusions:   Principal Problem:   Bacteremia due to group B Streptococcus Active Problems:   DM (diabetes mellitus)   CKD (chronic  kidney disease)   Sepsis due to Streptococcus, group B   Finger injury   UTI (urinary tract infection)    Time spent: [place], [place]  [facility] . If 7PM-7AM, please contact night-coverage at [email or web address], password [facility] 02/27/2013, 10:33 AM  LOS: 3 days    Addendum  Patient seen and examined, chart and data base reviewed.  I agree with the above assessment and plan.  For full details please see Miss Gwendalyn Ege' note.  GBS bacteremia, no evidence of valvular vegetations.  He will need total of 2 weeks of IV Abx.   Birdie Hopes, MD [facility] Pager: [phone] 02/27/2013, 2:49 PM

## 2013-02-27 NOTE — Discharge Summary (Signed)
Physician Discharge Summary  Michael Trevino [id number] DOB: 27-Jan-1941 DOA: 02/24/2013  PCP: Redge Gainer, MD  Admit date: 02/24/2013 Discharge date: 02/27/2013  Time spent: 40 minutes  Recommendations for Outpatient Follow-up:  1. Followup with primary care physician  Discharge Diagnoses:  Principal Problem:   Bacteremia due to group B Streptococcus Active Problems:   DM (diabetes mellitus)   CKD (chronic kidney disease)   Sepsis due to Streptococcus, group B   Finger injury   UTI (urinary tract infection)   Discharge Condition: Stable  Diet recommendation: Regular  Filed Weights   02/24/13 2029 02/24/13 2315 02/25/13 1858  Weight: 106.595 kg (235 lb) 109.5 kg (241 lb 6.5 oz) 109.77 kg (242 lb)    History of present illness:  Michael Trevino is a 72 y.o. male who has been feeling ill since about thurs of last week (3-4 days). Symptoms include fever, chills, body aches, myalgias. Similar symptoms in December and was admitted for cellulitus of abdomengroup at that time. Patient seen in ED yesterday, fever of 103.6, blood cultures were drawn at that time and patient discharged with f/u scheduled on Wed with PCP. Today blood cultures (taken yesterday) came back positive x2 for GPC in chains, and his urine culture showed 40k CFU of GBS (S. Agalactiae), patient was called back by ED and told to return to ED.  In ED patient noted to be febrile, tachycardic, and slightly tachypnic, blood pressures were stable. Given Rocephin and vancomycin and hospitalist asked to admit for bacteremia with sepsis.   Hospital Course:   1. Bacteremia: Due to group B streptococcus (Streptococcus Agalactiae), patient came in to the hospital 2 days ago because of fever and chills and blood culture obtained and he was sent home, blood culture was positive for group B Streptococcus, patient was called and he came in to the hospital for further evaluation. Infectious disease was consulted. Patient was placed on  vancomycin and Rocephin initially, infectious disease recommended to discontinue vancomycin continue Rocephin only had high dose of 2 g per day for total of 2 weeks. TEE was obtained to rule out endocarditis as patient has systolic murmur and he did not not show any vegetation. The source of the bacteremia was still a mystery at the time of discharge, patient did not have any wounds/cellulitis, denies any recent pneumonias or throat infections. Patient has history of questionable abdominal wall cellulitis about 4 months ago. PICC placed today, as mentioned above ID recommended Rocephin for total of 2 weeks, patient will have IV antibiotics for 12 more days as outpatient.  2. UTI: Also grew GBS, urine culture showed total of 40,000 CFU of GBS, per ID this is likely secondary to the bacteremia rather than its causative.  3. Diabetes mellitus type 2: Blood sugar she is to be controlled during this hospital stay, continue home medications.  4. 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  5. Hypothyroidism: Continue home medications.  Procedures:  Placement of PICC on 02/27/2013  Consultations:  Infectious disease Dr. Tommy Medal  Discharge Exam: Filed Vitals:   02/26/13 2128 02/26/13 2204 02/27/13 0558 02/27/13 1011  BP: 137/63 148/63 128/70 127/50  Pulse: 86 86 72 80  Temp: 98.4 F (36.9 C)  98.1 F (36.7 C)   TempSrc: Oral  Oral   Resp: 22  24   Height:      Weight:      SpO2: 92%  93%  General: Alert and awake, oriented x3, not in any acute distress. HEENT: anicteric sclera, pupils reactive to light and accommodation, EOMI CVS: S1-S2 clear, no murmur rubs or gallops Chest: clear to auscultation bilaterally, no wheezing, rales or rhonchi Abdomen: soft nontender, nondistended, normal bowel sounds, no organomegaly Extremities: no cyanosis, clubbing or edema noted bilaterally Neuro: Cranial nerves II-XII  intact, no focal neurological deficits  Discharge Instructions     Medication List    TAKE these medications       amLODipine 10 MG tablet  Commonly known as:  NORVASC  Take 10 mg by mouth daily.     ascorbic acid 500 MG tablet  Commonly known as:  VITAMIN C  Take 500 mg by mouth daily.     aspirin 325 MG EC tablet  Take 325 mg by mouth daily.     benazepril 40 MG tablet  Commonly known as:  LOTENSIN  Take 40 mg by mouth daily.     dextrose 5 % SOLN 50 mL with cefTRIAXone 2 G SOLR 2 g  Inject 2 g into the vein daily.     ezetimibe 10 MG tablet  Commonly known as:  ZETIA  Take 1 tablet (10 mg total) by mouth daily.     furosemide 40 MG tablet  Commonly known as:  LASIX  Take 40 mg by mouth 2 (two) times daily.     glimepiride 4 MG tablet  Commonly known as:  AMARYL  Take 4 mg by mouth 2 (two) times daily.     insulin glargine 100 UNIT/ML injection  Commonly known as:  LANTUS  Inject 0.5 mLs (50 Units total) into the skin daily.     levothyroxine 100 MCG tablet  Commonly known as:  SYNTHROID, LEVOTHROID  Take 1 tablet (100 mcg total) by mouth daily.     metoprolol 100 MG tablet  Commonly known as:  LOPRESSOR  Take 100 mg by mouth 2 (two) times daily.     niacin 1000 MG CR tablet  Commonly known as:  NIASPAN  Take 1,000 mg by mouth at bedtime.     pioglitazone 30 MG tablet  Commonly known as:  ACTOS  Take 30 mg by mouth daily.     rosuvastatin 20 MG tablet  Commonly known as:  CRESTOR  Take 20 mg by mouth at bedtime.     vitamin E 400 UNIT capsule  Take 400 Units by mouth daily.          The results of significant diagnostics from this hospitalization (including imaging, microbiology, ancillary and laboratory) are listed below for reference.    Significant Diagnostic Studies: Dg Chest Port 1 View  02/23/2013  *RADIOLOGY REPORT*  Clinical Data: Fever.  Generalized weakness.  PORTABLE CHEST - 1 VIEW 02/23/2013 1056 hours:  Comparison: Portable  chest x-ray 11/04/2012.  Findings: Cardiac silhouette enlarged but stable.  Suboptimal inspiration due to body habitus accounts for mild atelectasis at the lung bases.  Lungs otherwise clear.  Pulmonary vascularity normal.  IMPRESSION: Suboptimal inspiration accounts for mild basilar atelectasis. Stable cardiomegaly without pulmonary edema.   Original Report Authenticated By: Evangeline Dakin, M.D.    Dg Hand Complete Right  02/25/2013  *RADIOLOGY REPORT*  Clinical Data: Trauma to right middle and ring fingers in December, question osteomyelitis  RIGHT HAND - COMPLETE 3+ VIEW  Comparison: None  Findings: Diffuse osseous demineralization. Minimal scattered narrowing of interphalangeal joints. Artifacts from IV catheter and tubing. No acute fracture, dislocation or bone destruction. Specifically no  evidence of osteomyelitis identified. Fingers superimposed on lateral view limiting assessment.  IMPRESSION: No acute osseous abnormalities.   Original Report Authenticated By: Lavonia Dana, M.D.     Microbiology: Recent Results (from the past 240 hour(s))  CULTURE, BLOOD (ROUTINE X 2)     Status: None   Collection Time    02/23/13 11:00 AM      Result Value Range Status   Specimen Description BLOOD RIGHT HAND   Final   Special Requests BOTTLES DRAWN AEROBIC ONLY 10CC   Final   Culture  Setup Time 02/23/2013 17:36   Final   Culture     Final   Value: GROUP B STREP(S.AGALACTIAE)ISOLATED     Note: SUSCEPTIBILITIES PERFORMED ON PREVIOUS CULTURE WITHIN THE LAST 5 DAYS.     Note: Gram Stain Report Called to,Read Back By and Verified With: Alanson Aly RN on 02/24/13 at 04:05 by Rise Mu   Report Status 02/26/2013 FINAL   Final  URINE CULTURE     Status: None   Collection Time    02/23/13 11:36 AM      Result Value Range Status   Specimen Description URINE, CLEAN CATCH   Final   Special Requests NONE   Final   Culture  Setup Time 02/23/2013 17:38   Final   Colony Count 40,000 COLONIES/ML   Final   Culture      Final   Value: GROUP B STREP(S.AGALACTIAE)ISOLATED     Note: TESTING AGAINST S. AGALACTIAE NOT ROUTINELY PERFORMED DUE TO PREDICTABILITY OF AMP/PEN/VAN SUSCEPTIBILITY.   Report Status 02/24/2013 FINAL   Final  CULTURE, BLOOD (ROUTINE X 2)     Status: None   Collection Time    02/23/13 12:24 PM      Result Value Range Status   Specimen Description BLOOD RIGHT ARM   Final   Special Requests BOTTLES DRAWN AEROBIC AND ANAEROBIC 10CC   Final   Culture  Setup Time 02/23/2013 17:36   Final   Culture     Final   Value: GROUP B STREP(S.AGALACTIAE)ISOLATED     Note: Gram Stain Report Called to,Read Back By and Verified With: Lucila Maine 02/24/13 1045 BY SMITHERSJ   Report Status 02/26/2013 FINAL   Final   Organism ID, Bacteria GROUP B STREP(S.AGALACTIAE)ISOLATED   Final  CULTURE, BLOOD (ROUTINE X 2)     Status: None   Collection Time    02/24/13  9:50 PM      Result Value Range Status   Specimen Description BLOOD LEFT ARM   Final   Special Requests BOTTLES DRAWN AEROBIC AND ANAEROBIC 10CC EACH   Final   Culture  Setup Time 02/25/2013 06:53   Final   Culture     Final   Value:        BLOOD CULTURE RECEIVED NO GROWTH TO DATE CULTURE WILL BE HELD FOR 5 DAYS BEFORE ISSUING A FINAL NEGATIVE REPORT   Report Status PENDING   Incomplete  CULTURE, BLOOD (ROUTINE X 2)     Status: None   Collection Time    02/24/13 10:11 PM      Result Value Range Status   Specimen Description BLOOD THUMB LEFT   Final   Special Requests     Final   Value: BOTTLES DRAWN AEROBIC AND ANAEROBIC 5CC PATIENT ON FOLLOWING VANCO   Culture  Setup Time 02/25/2013 06:54   Final   Culture     Final   Value:        BLOOD CULTURE  RECEIVED NO GROWTH TO DATE CULTURE WILL BE HELD FOR 5 DAYS BEFORE ISSUING A FINAL NEGATIVE REPORT   Report Status PENDING   Incomplete  MRSA PCR SCREENING     Status: None   Collection Time    02/24/13 11:30 PM      Result Value Range Status   MRSA by PCR NEGATIVE  NEGATIVE Final   Comment:             The GeneXpert MRSA Assay (FDA     approved for NASAL specimens     only), is one component of a     comprehensive MRSA colonization     surveillance program. It is not     intended to diagnose MRSA     infection nor to guide or     monitor treatment for     MRSA infections.  CULTURE, BLOOD (ROUTINE X 2)     Status: None   Collection Time    02/25/13  3:18 PM      Result Value Range Status   Specimen Description BLOOD RIGHT HAND   Final   Special Requests BOTTLES DRAWN AEROBIC AND ANAEROBIC 10CC   Final   Culture  Setup Time 02/25/2013 21:58   Final   Culture     Final   Value:        BLOOD CULTURE RECEIVED NO GROWTH TO DATE CULTURE WILL BE HELD FOR 5 DAYS BEFORE ISSUING A FINAL NEGATIVE REPORT   Report Status PENDING   Incomplete  CULTURE, BLOOD (ROUTINE X 2)     Status: None   Collection Time    02/25/13  3:25 PM      Result Value Range Status   Specimen Description BLOOD LEFT HAND   Final   Special Requests     Final   Value: BOTTLES DRAWN AEROBIC AND ANAEROBIC AER 10CC, ANA 8CC   Culture  Setup Time 02/25/2013 21:58   Final   Culture     Final   Value:        BLOOD CULTURE RECEIVED NO GROWTH TO DATE CULTURE WILL BE HELD FOR 5 DAYS BEFORE ISSUING A FINAL NEGATIVE REPORT   Report Status PENDING   Incomplete     Labs: Basic Metabolic Panel:  Recent Labs Lab 02/23/13 1100 02/24/13 2132 02/25/13 0515 02/27/13 0750  NA 136 134* 137 140  K 5.2* 3.6 3.7 3.7  CL 103 100 104 106  CO2 23 22 23 25   GLUCOSE 145* 140* 100* 161*  BUN 11 14 13 12   CREATININE 1.24 1.45* 1.51* 1.28  CALCIUM 8.7 8.6 8.2* 8.5   Liver Function Tests:  Recent Labs Lab 02/23/13 1100 02/24/13 2132  AST 47* 28  ALT 26 26  ALKPHOS 69 66  BILITOT 0.5 0.4  PROT 7.8 7.2  ALBUMIN 3.2* 3.0*   No results found for this basename: LIPASE, AMYLASE,  in the last 168 hours No results found for this basename: AMMONIA,  in the last 168 hours CBC:  Recent Labs Lab 02/23/13 1100 02/24/13 2011  02/24/13 2132 02/25/13 0515  WBC 7.4 7.2 7.5 7.2  NEUTROABS 5.7 5.0 5.4  --   HGB 13.6 13.1 12.8* 11.9*  HCT 37.6* 37.2* 36.5* 33.6*  MCV 85.5 85.7 86.1 84.8  PLT 185 132* 130* 135*   Cardiac Enzymes: No results found for this basename: CKTOTAL, CKMB, CKMBINDEX, TROPONINI,  in the last 168 hours BNP: BNP (last 3 results) No results found for this basename: PROBNP,  in  the last 8760 hours CBG:  Recent Labs Lab 02/26/13 1202 02/26/13 1636 02/26/13 2129 02/27/13 0733 02/27/13 1133  GLUCAP 131* 101* 263* 154* 205*       Signed:  Rodrickus Min A  [facility] 02/27/2013, 1:55 PM

## 2013-02-27 NOTE — Progress Notes (Addendum)
[facility] for Infectious Disease  Day # 3 ceftriaxone One day vancomycin   Subjective: No new complaints   Antibiotics:  Anti-infectives   Start     Dose/Rate Route Frequency Ordered Stop   02/25/13 1500  cefTRIAXone (ROCEPHIN) 2 g in dextrose 5 % 50 mL IVPB     2 g 100 mL/hr over 30 Minutes Intravenous Every 24 hours 02/25/13 1440     02/25/13 0600  vancomycin (VANCOCIN) 1,250 mg in sodium chloride 0.9 % 250 mL IVPB  Status:  Discontinued     1,250 mg 166.7 mL/hr over 90 Minutes Intravenous Every 12 hours 02/24/13 2142 02/25/13 1310   02/24/13 2015  gentamicin (GARAMYCIN) 1.5 mg/kg in dextrose 5 % 50 mL IVPB  Status:  Discontinued     1.5 mg/kg 100 mL/hr over 30 Minutes Intravenous Every 8 hours 02/24/13 2010 02/24/13 2015   02/24/13 2015  vancomycin (VANCOCIN) IVPB 1000 mg/200 mL premix     1,000 mg 200 mL/hr over 60 Minutes Intravenous  Once 02/24/13 2010 02/24/13 2133   02/24/13 2015  cefTRIAXone (ROCEPHIN) 1 g in dextrose 5 % 50 mL IVPB     1 g 100 mL/hr over 30 Minutes Intravenous  Once 02/24/13 2010 02/24/13 2239      Medications: Scheduled Meds: . amLODipine  10 mg Oral Daily  . aspirin  325 mg Oral Daily  . benazepril  40 mg Oral Daily  . cefTRIAXone (ROCEPHIN)  IV  2 g Intravenous Q24H  . ezetimibe  10 mg Oral Daily  . furosemide  40 mg Oral BID  . heparin  5,000 Units Subcutaneous Q8H  . insulin aspart  0-20 Units Subcutaneous TID WC  . insulin glargine  25 Units Subcutaneous Daily  . levothyroxine  100 mcg Oral QAC breakfast  . metoprolol  100 mg Oral BID  . niacin  1,000 mg Oral QHS  . sodium chloride  3 mL Intravenous Q12H   Continuous Infusions:  PRN Meds:.   Objective: Weight change:   Intake/Output Summary (Last 24 hours) at 02/27/13 0921 Last data filed at 02/27/13 0231  Gross per 24 hour  Intake 1426.25 ml  Output   3470 ml  Net -2043.75 ml   Blood pressure 128/70, pulse 72, temperature 98.1 F (36.7 C), temperature source  Oral, resp. rate 24, height 5\' 9"  (1.753 m), weight 242 lb (109.77 kg), SpO2 93.00%. Temp:  [97.5 F (36.4 C)-98.4 F (36.9 C)] 98.1 F (36.7 C) (04/17 0558) Pulse Rate:  [72-86] 72 (04/17 0558) Resp:  [20-72] 24 (04/17 0558) BP: (128-167)/(63-93) 128/70 mmHg (04/17 0558) SpO2:  [92 %-96 %] 93 % (04/17 0558)  Physical Exam: General: Alert and awake, oriented x3, not in any acute distress.  HEENT: anicteric sclera, pupils reactive to light and accommodation, EOMI, oropharynx clear and without exudate  CVS regular rate, normal r, no murmur rubs or gallops  Chest: clear to auscultation bilaterally, no wheezing, rales or rhonchi  Abdomen: soft nontender, nondistended, normal bowel sounds,  Extremities:2 digits on right hand with torsion of the nail and old dried blood in the nail bed. No obvious purulence  Skin: no rashes  Neuro: nonfocal, strength and sensation intact  Lab Results:  Recent Labs  02/24/13 2132 02/25/13 0515  WBC 7.5 7.2  HGB 12.8* 11.9*  HCT 36.5* 33.6*  PLT 130* 135*    BMET  Recent Labs  02/24/13 2132 02/25/13 0515  NA 134* 137  K 3.6 3.7  CL 100 104  CO2 22 23  GLUCOSE 140* 100*  BUN 14 13  CREATININE 1.45* 1.51*  CALCIUM 8.6 8.2*    Micro Results: Recent Results (from the past 240 hour(s))  CULTURE, BLOOD (ROUTINE X 2)     Status: None   Collection Time    02/23/13 11:00 AM      Result Value Range Status   Specimen Description BLOOD RIGHT HAND   Final   Special Requests BOTTLES DRAWN AEROBIC ONLY 10CC   Final   Culture  Setup Time 02/23/2013 17:36   Final   Culture     Final   Value: GROUP B STREP(S.AGALACTIAE)ISOLATED     Note: SUSCEPTIBILITIES PERFORMED ON PREVIOUS CULTURE WITHIN THE LAST 5 DAYS.     Note: Gram Stain Report Called to,Read Back By and Verified With: Alanson Aly RN on 02/24/13 at 04:05 by Rise Mu   Report Status 02/26/2013 FINAL   Final  URINE CULTURE     Status: None   Collection Time    02/23/13 11:36 AM      Result  Value Range Status   Specimen Description URINE, CLEAN CATCH   Final   Special Requests NONE   Final   Culture  Setup Time 02/23/2013 17:38   Final   Colony Count 40,000 COLONIES/ML   Final   Culture     Final   Value: GROUP B STREP(S.AGALACTIAE)ISOLATED     Note: TESTING AGAINST S. AGALACTIAE NOT ROUTINELY PERFORMED DUE TO PREDICTABILITY OF AMP/PEN/VAN SUSCEPTIBILITY.   Report Status 02/24/2013 FINAL   Final  CULTURE, BLOOD (ROUTINE X 2)     Status: None   Collection Time    02/23/13 12:24 PM      Result Value Range Status   Specimen Description BLOOD RIGHT ARM   Final   Special Requests BOTTLES DRAWN AEROBIC AND ANAEROBIC 10CC   Final   Culture  Setup Time 02/23/2013 17:36   Final   Culture     Final   Value: GROUP B STREP(S.AGALACTIAE)ISOLATED     Note: Gram Stain Report Called to,Read Back By and Verified With: Lucila Maine 02/24/13 1045 BY SMITHERSJ   Report Status 02/26/2013 FINAL   Final   Organism ID, Bacteria GROUP B STREP(S.AGALACTIAE)ISOLATED   Final  CULTURE, BLOOD (ROUTINE X 2)     Status: None   Collection Time    02/24/13  9:50 PM      Result Value Range Status   Specimen Description BLOOD LEFT ARM   Final   Special Requests BOTTLES DRAWN AEROBIC AND ANAEROBIC 10CC EACH   Final   Culture  Setup Time 02/25/2013 06:53   Final   Culture     Final   Value:        BLOOD CULTURE RECEIVED NO GROWTH TO DATE CULTURE WILL BE HELD FOR 5 DAYS BEFORE ISSUING A FINAL NEGATIVE REPORT   Report Status PENDING   Incomplete  CULTURE, BLOOD (ROUTINE X 2)     Status: None   Collection Time    02/24/13 10:11 PM      Result Value Range Status   Specimen Description BLOOD THUMB LEFT   Final   Special Requests     Final   Value: BOTTLES DRAWN AEROBIC AND ANAEROBIC 5CC PATIENT ON FOLLOWING VANCO   Culture  Setup Time 02/25/2013 06:54   Final   Culture     Final   Value:        BLOOD CULTURE RECEIVED NO GROWTH TO DATE CULTURE WILL  BE HELD FOR 5 DAYS BEFORE ISSUING A FINAL NEGATIVE  REPORT   Report Status PENDING   Incomplete  MRSA PCR SCREENING     Status: None   Collection Time    02/24/13 11:30 PM      Result Value Range Status   MRSA by PCR NEGATIVE  NEGATIVE Final   Comment:            The GeneXpert MRSA Assay (FDA     approved for NASAL specimens     only), is one component of a     comprehensive MRSA colonization     surveillance program. It is not     intended to diagnose MRSA     infection nor to guide or     monitor treatment for     MRSA infections.  CULTURE, BLOOD (ROUTINE X 2)     Status: None   Collection Time    02/25/13  3:18 PM      Result Value Range Status   Specimen Description BLOOD RIGHT HAND   Final   Special Requests BOTTLES DRAWN AEROBIC AND ANAEROBIC 10CC   Final   Culture  Setup Time 02/25/2013 21:58   Final   Culture     Final   Value:        BLOOD CULTURE RECEIVED NO GROWTH TO DATE CULTURE WILL BE HELD FOR 5 DAYS BEFORE ISSUING A FINAL NEGATIVE REPORT   Report Status PENDING   Incomplete  CULTURE, BLOOD (ROUTINE X 2)     Status: None   Collection Time    02/25/13  3:25 PM      Result Value Range Status   Specimen Description BLOOD LEFT HAND   Final   Special Requests     Final   Value: BOTTLES DRAWN AEROBIC AND ANAEROBIC AER 10CC, ANA 8CC   Culture  Setup Time 02/25/2013 21:58   Final   Culture     Final   Value:        BLOOD CULTURE RECEIVED NO GROWTH TO DATE CULTURE WILL BE HELD FOR 5 DAYS BEFORE ISSUING A FINAL NEGATIVE REPORT   Report Status PENDING   Incomplete    Studies/Results: Dg Hand Complete Right  02/25/2013  *RADIOLOGY REPORT*  Clinical Data: Trauma to right middle and ring fingers in December, question osteomyelitis  RIGHT HAND - COMPLETE 3+ VIEW  Comparison: None  Findings: Diffuse osseous demineralization. Minimal scattered narrowing of interphalangeal joints. Artifacts from IV catheter and tubing. No acute fracture, dislocation or bone destruction. Specifically no evidence of osteomyelitis identified.  Fingers superimposed on lateral view limiting assessment.  IMPRESSION: No acute osseous abnormalities.   Original Report Authenticated By: Lavonia Dana, M.D.       Assessment/Plan: Michael Trevino is a 71 y.o. male  with Group B streptococcal bacteremia   #1 Group B streptococcal bacteremia: the CFU of 40k from urine seems TOO low to argue for a fuliminant UTI that caused bacteremia. I have suspected that  his GBS bacteremia has some other occult source and Has instead seeded his urine. Thanful to Cardiology for TEE which is negative for vegetations. His plain films hand are negative for osteomyelitis and on exam do not appear infected. --I have recommended the pt complete 2 weeks of IV rocephin thru 03/09/13 which would give him 2 weeks post clearance of his blood cultures --we will see him in all up and follow him closely to see if he has any evidence or clinical signs for infection  at some other site. Patients with group B strep bacteremia for going to present without an obvious cause and as above I've been unimpressive urine as the source.  #2 Screening:HIV negative  I will arrange HSFU in my clinic in the next 2-3 weeks    LOS: 3 days   Alcide Evener 02/27/2013, 9:21 AM

## 2013-02-27 NOTE — Progress Notes (Signed)
Michael Trevino discharged Home with H/H RN for IV abx. per MD order.  Discharge instructions reviewed and discussed with the patient, all questions and concerns answered. Copy of instructions and scripts given to patient.  Explained to pt. Daughter how to sign up for mychart.    Medication List    TAKE these medications       amLODipine 10 MG tablet  Commonly known as:  NORVASC  Take 10 mg by mouth daily.     ascorbic acid 500 MG tablet  Commonly known as:  VITAMIN C  Take 500 mg by mouth daily.     aspirin 325 MG EC tablet  Take 325 mg by mouth daily.     benazepril 40 MG tablet  Commonly known as:  LOTENSIN  Take 40 mg by mouth daily.     dextrose 5 % SOLN 50 mL with cefTRIAXone 2 G SOLR 2 g  Inject 2 g into the vein daily.     ezetimibe 10 MG tablet  Commonly known as:  ZETIA  Take 1 tablet (10 mg total) by mouth daily.     furosemide 40 MG tablet  Commonly known as:  LASIX  Take 40 mg by mouth 2 (two) times daily.     glimepiride 4 MG tablet  Commonly known as:  AMARYL  Take 4 mg by mouth 2 (two) times daily.     insulin glargine 100 UNIT/ML injection  Commonly known as:  LANTUS  Inject 0.5 mLs (50 Units total) into the skin daily.     levothyroxine 100 MCG tablet  Commonly known as:  SYNTHROID, LEVOTHROID  Take 1 tablet (100 mcg total) by mouth daily.     metoprolol 100 MG tablet  Commonly known as:  LOPRESSOR  Take 100 mg by mouth 2 (two) times daily.     niacin 1000 MG CR tablet  Commonly known as:  NIASPAN  Take 1,000 mg by mouth at bedtime.     pioglitazone 30 MG tablet  Commonly known as:  ACTOS  Take 30 mg by mouth daily.     rosuvastatin 20 MG tablet  Commonly known as:  CRESTOR  Take 20 mg by mouth at bedtime.     vitamin E 400 UNIT capsule  Take 400 Units by mouth daily.        Patients skin is clean, dry and intact, no evidence of skin break down. IV site discontinued and catheter remains intact. Site without signs and symptoms of  complications. Dressing and pressure applied.  Right PICC line left in at d/c and flushed by IV team.  Patient escorted to car by RN in a wheelchair,  no distress noted upon discharge.  Wynetta Emery, Noelene Gang C 02/27/2013 5:10 PM

## 2013-03-03 LAB — CULTURE, BLOOD (ROUTINE X 2)
Culture: NO GROWTH
Culture: NO GROWTH
Culture: NO GROWTH
Culture: NO GROWTH

## 2013-03-07 ENCOUNTER — Ambulatory Visit (INDEPENDENT_AMBULATORY_CARE_PROVIDER_SITE_OTHER): Payer: Medicare Other | Admitting: Nurse Practitioner

## 2013-03-07 ENCOUNTER — Encounter: Payer: Self-pay | Admitting: Nurse Practitioner

## 2013-03-07 VITALS — BP 125/65 | HR 67 | Temp 97.5°F | Ht 69.0 in | Wt 237.0 lb

## 2013-03-07 DIAGNOSIS — Z09 Encounter for follow-up examination after completed treatment for conditions other than malignant neoplasm: Secondary | ICD-10-CM

## 2013-03-07 NOTE — Progress Notes (Signed)
  Subjective:    Patient ID: Michael Trevino, male    DOB: [birth year], 72 y.o.   MRN: [id number]  HPI  Patient here today for hospital follow up . Patient doing well without complaints. Patient currently on antibiotic through pick line. Had bacteria in blood that thought was caused by a tick bite. Patient gives himself the antibiotic one time per day. Home health going to draw blood Monday to make sure infection has resolved. Pick line in right arm needs to be checked. Patient feeling much better.    Review of Systems  Constitutional: Negative.   HENT: Negative.   Respiratory: Negative.   Cardiovascular: Negative.   Skin: Negative.   Psychiatric/Behavioral: Negative.        Objective:   Physical Exam  Constitutional: He appears well-developed and well-nourished.  Cardiovascular: Normal rate and normal heart sounds.   Pulmonary/Chest: Effort normal and breath sounds normal.  Skin: Skin is warm and dry.  Pick line right forearm no erythema, edema or pain  BP 125/65  Pulse 67  Temp(Src) 97.5 F (36.4 C) (Oral)  Ht 5\' 9"  (1.753 m)  Wt 237 lb (107.502 kg)  BMI 34.98 kg/m2       Assessment & Plan:  1. Hospital discharge follow-up Finish antibiotics as rx- using sterile technigue Keep followup appointment next week for blood work RTO PRN  [place], [place]

## 2013-03-11 ENCOUNTER — Other Ambulatory Visit: Payer: Self-pay | Admitting: Nurse Practitioner

## 2013-03-12 DIAGNOSIS — E119 Type 2 diabetes mellitus without complications: Secondary | ICD-10-CM

## 2013-03-12 DIAGNOSIS — B951 Streptococcus, group B, as the cause of diseases classified elsewhere: Secondary | ICD-10-CM

## 2013-03-12 DIAGNOSIS — N390 Urinary tract infection, site not specified: Secondary | ICD-10-CM

## 2013-03-12 DIAGNOSIS — A419 Sepsis, unspecified organism: Secondary | ICD-10-CM

## 2013-03-17 ENCOUNTER — Other Ambulatory Visit: Payer: Self-pay | Admitting: Nurse Practitioner

## 2013-03-19 ENCOUNTER — Inpatient Hospital Stay: Payer: Medicare Other | Admitting: Infectious Disease

## 2013-03-26 ENCOUNTER — Encounter: Payer: Self-pay | Admitting: Infectious Disease

## 2013-03-28 ENCOUNTER — Other Ambulatory Visit: Payer: Self-pay | Admitting: Nurse Practitioner

## 2013-04-11 ENCOUNTER — Encounter: Payer: Self-pay | Admitting: Family Medicine

## 2013-04-11 ENCOUNTER — Ambulatory Visit (INDEPENDENT_AMBULATORY_CARE_PROVIDER_SITE_OTHER): Payer: Medicare Other | Admitting: Family Medicine

## 2013-04-11 VITALS — BP 123/59 | HR 68 | Temp 98.0°F | Wt 240.6 lb

## 2013-04-11 DIAGNOSIS — E785 Hyperlipidemia, unspecified: Secondary | ICD-10-CM

## 2013-04-11 DIAGNOSIS — A419 Sepsis, unspecified organism: Secondary | ICD-10-CM

## 2013-04-11 DIAGNOSIS — B951 Streptococcus, group B, as the cause of diseases classified elsewhere: Secondary | ICD-10-CM

## 2013-04-11 DIAGNOSIS — E782 Mixed hyperlipidemia: Secondary | ICD-10-CM | POA: Insufficient documentation

## 2013-04-11 DIAGNOSIS — A401 Sepsis due to streptococcus, group B: Secondary | ICD-10-CM

## 2013-04-11 DIAGNOSIS — N189 Chronic kidney disease, unspecified: Secondary | ICD-10-CM

## 2013-04-11 DIAGNOSIS — N529 Male erectile dysfunction, unspecified: Secondary | ICD-10-CM

## 2013-04-11 DIAGNOSIS — A409 Streptococcal sepsis, unspecified: Secondary | ICD-10-CM

## 2013-04-11 DIAGNOSIS — I2510 Atherosclerotic heart disease of native coronary artery without angina pectoris: Secondary | ICD-10-CM

## 2013-04-11 DIAGNOSIS — E119 Type 2 diabetes mellitus without complications: Secondary | ICD-10-CM

## 2013-04-11 DIAGNOSIS — I10 Essential (primary) hypertension: Secondary | ICD-10-CM

## 2013-04-11 LAB — HEPATIC FUNCTION PANEL
ALT: 21 U/L (ref 0–53)
AST: 24 U/L (ref 0–37)
Albumin: 4.1 g/dL (ref 3.5–5.2)
Alkaline Phosphatase: 77 U/L (ref 39–117)
Bilirubin, Direct: 0.1 mg/dL (ref 0.0–0.3)
Indirect Bilirubin: 0.4 mg/dL (ref 0.0–0.9)
Total Bilirubin: 0.5 mg/dL (ref 0.3–1.2)
Total Protein: 7.3 g/dL (ref 6.0–8.3)

## 2013-04-11 LAB — POCT CBC
Granulocyte percent: 47.8 %G (ref 37–80)
HCT, POC: 39.8 % — AB (ref 43.5–53.7)
Hemoglobin: 13.1 g/dL — AB (ref 14.1–18.1)
Lymph, poc: 2.6 (ref 0.6–3.4)
MCH, POC: 29.5 pg (ref 27–31.2)
MCHC: 33 g/dL (ref 31.8–35.4)
MCV: 89.5 fL (ref 80–97)
MPV: 6.2 fL (ref 0–99.8)
POC Granulocyte: 2.9 (ref 2–6.9)
POC LYMPH PERCENT: 43.5 %L (ref 10–50)
Platelet Count, POC: 176 10*3/uL (ref 142–424)
RBC: 4.5 M/uL — AB (ref 4.69–6.13)
RDW, POC: 14.8 %
WBC: 6 10*3/uL (ref 4.6–10.2)

## 2013-04-11 LAB — POCT GLYCOSYLATED HEMOGLOBIN (HGB A1C): Hemoglobin A1C: 8.5

## 2013-04-11 NOTE — Progress Notes (Signed)
Patient ID: Michael Trevino, male   DOB: 08-Jan-1941, 72 y.o.   MRN: [id number] SUBJECTIVE: HPI: Patient is here for follow up of Diabetes Mellitus: Symptoms of DM: Denies Nocturia ,Denies Urinary Frequency , denies Blurred vision ,deniesDizziness,denies.Dysuria,denies paresthesias, denies extremity pain or ulcers.Marland Kitchendenies chest pain. has had an annual eye exam. do check the feet. Does check CBGs. Average CBG: 130 Denies episodes of hypoglycemia. Does have an emergency hypoglycemic plan. admits toCompliance with medications. Denies Problems with medications.  Breakfast : bologna sandwich and water Lunch: green beans, potatoes, Kem Kays.  recently hospitalized for Broup B streptococcal sepsis. Complete antibiotic treatment  Would like to consider changing the crestor to something cheaper. Has gotten samples from [place] at times.   PMH/PSH: reviewed/updated in Epic  SH/FH: reviewed/updated in Epic  Allergies: reviewed/updated in Epic  Medications: reviewed/updated in Epic  Immunizations: reviewed/updated in Epic  ROS: As above in the HPI. All other systems are stable or negative.  OBJECTIVE: APPEARANCE:  Patient in no acute distress.The patient appeared well nourished and normally developed. Acyanotic. Waist: VITAL SIGNS:BP 123/59  Pulse 68  Temp(Src) 98 F (36.7 C) (Oral)  Wt 240 lb 9.6 oz (109.135 kg)  BMI 35.51 kg/m2 Obese AAM  SKIN: warm and  Dry without overt rashes, tattoos and scars  HEAD and Neck: without JVD, Head and scalp: normal Eyes:No scleral icterus. Fundi normal, eye movements normal. Ears: Auricle normal, canal normal, Tympanic membranes normal, insufflation normal. Nose: normal Throat: normal Neck & thyroid: normal  CHEST & LUNGS: Chest wall: normal Lungs: Clear  CVS: Reveals the PMI to be normally located. Regular rhythm, First and Second Heart sounds are normal,  2/6 soft SEmurmur, no rubs or gallops. Peripheral vasculature: Radial  pulses: normal Dorsal pedis pulses: normal Posterior pulses: normal  ABDOMEN:  Appearance: normal Benign,, no organomegaly, no masses, no Abdominal Aortic enlargement. No Guarding , no rebound. No Bruits. Bowel sounds: normal  RECTAL: N/A GU: N/A  EXTREMETIES: nonedematous. Both Femoral and Pedal pulses are normal.  MUSCULOSKELETAL:  Spine: normal Joints: intact  NEUROLOGIC: oriented to time,place and person; nonfocal. Strength is normal Sensory is normal Reflexes are normal Cranial Nerves are normal.  ASSESSMENT: DM (diabetes mellitus) - Plan: POCT glycosylated hemoglobin (Hb A1C)  HTN (hypertension)  HLD (hyperlipidemia) - Plan: Hepatic function panel, NMR Lipoprofile with Lipids  CKD (chronic kidney disease), unspecified stage  CAD (coronary artery disease)  Erectile dysfunction - Plan: Hepatic function panel  Sepsis due to Streptococcus, group B - Plan: Urine culture, POCT CBC  PLAN:  Orders Placed This Encounter  Procedures  . Urine culture  . Hepatic function panel  . NMR Lipoprofile with Lipids  . POCT glycosylated hemoglobin (Hb A1C)  . POCT CBC   No orders of the defined types were placed in this encounter.        Dr Paula Libra Recommendations  Diet and Exercise discussed with patient.  For nutrition information, I recommend books:  1).Eat to Live by Dr Excell Seltzer. 2).Prevent and Reverse Heart Disease by Dr Karl Luke. 3) Dr Janene Harvey Book: Reversing Diabetes  Exercise recommendations are:  If unable to walk, then the patient can exercise in a chair 3 times a day. By flapping arms like a bird gently and raising legs outwards to the front.  If ambulatory, the patient can go for walks for 30 minutes 3 times a week. Then increase the intensity and duration as tolerated.  Goal is to try to attain exercise frequency to 5 times a  week.  If applicable: Best to perform resistance exercises (machines or weights) 2 days a week  and cardio type exercises 3 days per week.   A literate family member to read information given in the AVS.  Await labs to decide on statin changes.  Return in about 3 months (around 07/12/2013) for Recheck medical problems.   Homar Weinkauf P. Jacelyn Grip, M.D.

## 2013-04-11 NOTE — Patient Instructions (Addendum)
      Dr Caro Brundidge's Recommendations  Diet and Exercise discussed with patient.  For nutrition information, I recommend books:  1).Eat to Live by Dr Joel Fuhrman. 2).Prevent and Reverse Heart Disease by Dr Caldwell Esselstyn. 3) Dr Neal Barnard's Book: Reversing Diabetes  Exercise recommendations are:  If unable to walk, then the patient can exercise in a chair 3 times a day. By flapping arms like a bird gently and raising legs outwards to the front.  If ambulatory, the patient can go for walks for 30 minutes 3 times a week. Then increase the intensity and duration as tolerated.  Goal is to try to attain exercise frequency to 5 times a week.  If applicable: Best to perform resistance exercises (machines or weights) 2 days a week and cardio type exercises 3 days per week.  

## 2013-04-12 LAB — URINE CULTURE
Colony Count: NO GROWTH
Organism ID, Bacteria: NO GROWTH

## 2013-04-15 ENCOUNTER — Telehealth: Payer: Self-pay

## 2013-04-15 DIAGNOSIS — E785 Hyperlipidemia, unspecified: Secondary | ICD-10-CM

## 2013-04-15 LAB — NMR LIPOPROFILE WITH LIPIDS
Cholesterol, Total: 108 mg/dL (ref <200)
HDL Particle Number: 25.7 umol/L — ABNORMAL LOW (ref >=30.5)
HDL Size: 8.5 nm — ABNORMAL LOW (ref >=9.2)
HDL-C: 33 mg/dL — ABNORMAL LOW (ref >=40)
LDL (calc): 49 mg/dL (ref <100)
LDL Particle Number: 837 nmol/L (ref <1000)
LDL Size: 20.6 nm (ref >20.5)
LP-IR Score: 60 — ABNORMAL HIGH (ref <=45)
Large HDL-P: <1.3 umol/L — ABNORMAL LOW (ref >=4.8)
Large VLDL-P: 1.2 nmol/L (ref <=2.7)
Small LDL Particle Number: 468 nmol/L (ref <=527)
Triglycerides: 131 mg/dL (ref <150)
VLDL Size: 47.4 nm — ABNORMAL HIGH (ref <=46.6)

## 2013-04-15 MED ORDER — EZETIMIBE 10 MG PO TABS: 10.0000 mg | ORAL_TABLET | Freq: Every day | ORAL | Status: DC

## 2013-04-15 NOTE — Telephone Encounter (Signed)
Samples zetia out front ---pt aware

## 2013-05-12 ENCOUNTER — Other Ambulatory Visit: Payer: Self-pay | Admitting: Physician Assistant

## 2013-05-13 NOTE — Telephone Encounter (Signed)
Last thyroid level 2012   Last seen 11/29/12   ACm

## 2013-05-15 ENCOUNTER — Ambulatory Visit (INDEPENDENT_AMBULATORY_CARE_PROVIDER_SITE_OTHER): Payer: Medicare Other | Admitting: Pharmacist

## 2013-05-15 VITALS — BP 136/60 | HR 68 | Ht 69.0 in | Wt 243.0 lb

## 2013-05-15 DIAGNOSIS — E785 Hyperlipidemia, unspecified: Secondary | ICD-10-CM

## 2013-05-15 DIAGNOSIS — I10 Essential (primary) hypertension: Secondary | ICD-10-CM

## 2013-05-15 DIAGNOSIS — I2510 Atherosclerotic heart disease of native coronary artery without angina pectoris: Secondary | ICD-10-CM

## 2013-05-15 DIAGNOSIS — E119 Type 2 diabetes mellitus without complications: Secondary | ICD-10-CM

## 2013-05-15 MED ORDER — LINAGLIPTIN 5 MG PO TABS: 5.0000 mg | ORAL_TABLET | Freq: Every day | ORAL | Status: DC

## 2013-05-15 MED ORDER — INSULIN DETEMIR 100 UNIT/ML FLEXPEN: 50.0000 [IU] | PEN_INJECTOR | Freq: Every day | SUBCUTANEOUS | Status: DC

## 2013-05-15 MED ORDER — ROSUVASTATIN CALCIUM 20 MG PO TABS: 20.0000 mg | ORAL_TABLET | Freq: Every day | ORAL | Status: DC

## 2013-05-15 MED ORDER — EZETIMIBE 10 MG PO TABS: 10.0000 mg | ORAL_TABLET | Freq: Every day | ORAL | Status: DC

## 2013-05-15 NOTE — Progress Notes (Signed)
Diabetes Follow-Up Visit Chief Complaint:   Chief Complaint  Patient presents with  . Diabetes     Filed Vitals:   05/15/13 0828  BP: 136/60  Pulse: 68    HPI:  Patient with long standing diabetes.   Last a1c from 03/2013 showed poor control.   He reports that he has not been taking his full dose of Lantus because he is in the Medicare coverage gap and the cost to too high.  Instead of 50 units he is usually injecting 40 to 45 units.   Michael Trevino also has recent history of bacteremia (02/2013) and cellulitis on his abdomen (10/2012) at insulin injection site.  Both of these issues are resolved.    Current Diabetes Medications:  Lantus 40 to 50 units daily, glimepiride 4mg  bid, pioglitazone 30mg  daily  Exam Edema:  negative   Polyuria:  negative Polydipsia:  negative Polyphagia:  negative  Abdomen - no signs of infection noted today.  Patient is injecting insulin in outer thigh now.  Outer thighs - no signs of infection noted today BMI:  Body mass index is 35.87 kg/(m^2).   Weight changes:  stable General Appearance:  alert, oriented, no acute distress Mood/Affect:  normal   Low fat/carbohydrate diet?  No Nicotine Abuse?  No Medication Compliance?  No - due to cost Exercise?  No Alcohol Abuse?  No  Home BG Monitoring:  Checking  3 times a week Samples HBG readings per patient 130, 160, 126.  Patient did not bring in glucometer.    Lab Results  Component Value Date   HGBA1C 8.5 04/11/2013    No results found for this basename: Derl Barrow    Lab Results  Component Value Date   LDLCALC 49 04/11/2013   TRIG 131 04/11/2013      Assessment: 1.  Diabetes.  Uncontrolled 2.  Blood Pressure.  At goal 3.  Lipids.  At goals with current therapy of crestor 20mg  daily and zetia 10mg  daily   Recommendations: 1.  Medication recommendations at this time are as follows:  Add tradgenta 5mg  1 tablet daily - gave #28 samples Change Lantus to Levemir since cannot get  Lantus through patient assistance program.  Patient instructed to restart 50 units daily (Gave #2 pen samples to use until we can find out if he can get through patient assistance program)  Will send referral to [place] - Patient Assistance Program to see if pt qualifies for help since he is in Medicare coverage gap.  2.  Reviewed HBG goals:  Fasting 80-130 and 1-2 hour post prandial <180.  Patient is instructed to check BG 1 times per day.    3.  BP goal < 140/80. 4.  LDL goal of < 100, HDL > 40 and TG < 150. 5.  Eye Exam yearly and Dental Exam every 6 months. 6.  Dietary recommendations:  Reviewed CHO counting/ADA diet 7.  Physical Activity recommendations:  As able, start with 10 minutes daily and work up to 30 minutes as tolerated 8.  Return to clinic in 4-6 wks   Time spent counseling patient:  40 minutes  Referring provider:  Tish Men, PharmD, CPP

## 2013-05-22 ENCOUNTER — Telehealth: Payer: Self-pay | Admitting: Pharmacist

## 2013-05-22 NOTE — Telephone Encounter (Signed)
Called patient to let him know paperwork for [facility] Patient Assistance program had been faxed.  Also paperwork for Xubex was filled out and put upfront for patient.

## 2013-06-05 ENCOUNTER — Other Ambulatory Visit: Payer: Self-pay | Admitting: Family Medicine

## 2013-06-10 ENCOUNTER — Other Ambulatory Visit: Payer: Self-pay | Admitting: Family Medicine

## 2013-06-12 ENCOUNTER — Ambulatory Visit (INDEPENDENT_AMBULATORY_CARE_PROVIDER_SITE_OTHER): Payer: Medicare Other | Admitting: Pharmacist

## 2013-06-12 ENCOUNTER — Encounter: Payer: Self-pay | Admitting: Pharmacist

## 2013-06-12 VITALS — BP 142/62 | HR 68 | Ht 69.0 in | Wt 242.0 lb

## 2013-06-12 DIAGNOSIS — E785 Hyperlipidemia, unspecified: Secondary | ICD-10-CM

## 2013-06-12 DIAGNOSIS — E119 Type 2 diabetes mellitus without complications: Secondary | ICD-10-CM

## 2013-06-12 MED ORDER — INSULIN DETEMIR 100 UNIT/ML FLEXPEN: 47.0000 [IU] | PEN_INJECTOR | Freq: Every day | SUBCUTANEOUS | Status: DC

## 2013-06-12 NOTE — Patient Instructions (Addendum)

## 2013-06-12 NOTE — Progress Notes (Signed)
Diabetes Follow-Up Visit Chief Complaint:   Chief Complaint  Patient presents with  . Diabetes     Filed Vitals:   06/12/13 1301  BP: 142/62  Pulse: 68    HPI:  Patient with long standing diabetes.   Last a1c from 03/2013 showed poor control.   He has met with [facility] and is waiting to find out if he qualifies for this program that will help with mediation cost.   Current Diabetes Medications:  Lantus/Levemir 50 units daily, glimepiride 4mg  bid, pioglitazone 30mg  daily, Tradgenta 5mg  daily  Exam Edema:  negative   Polyuria:  negative Polydipsia:  negative Polyphagia:  negative  BMI:  Body mass index is 35.72 kg/(m^2).   Weight changes:  stable General Appearance:  alert, oriented, no acute distress Mood/Affect:  normal   Low fat/carbohydrate diet?  Improved since last visit.  Trying to limit high CHO foods.   Nicotine Abuse?  No Medication Compliance?  Yes Exercise?  No Alcohol Abuse?  No  Home BG Monitoring:  Checking  3 times a week Samples HBG readings per patient 130, 140, 123.  Patient did not bring in glucometer today Patient does c/o 2 episodes of hypoglycemia that occurred in the evening and after increased activity    Lab Results  Component Value Date   HGBA1C 8.5 04/11/2013    No results found for this basenameDerl Barrow    Lab Results  Component Value Date   LDLCALC 49 04/11/2013   TRIG 131 04/11/2013      Assessment: 1.  Diabetes.  Uncontrolled but improving 2.  Blood Pressure.  SBP slightly elevated today 3.  Lipids.  At goals with current therapy of crestor 20mg  daily and zetia 10mg  daily   Recommendations: 1.  Medication recommendations at this time are as follows:    Continue tradgenta 5mg  1 tablet daily - gave #35 samples  Decrease Lantus / Levemir to 47 units daily  Discontinue OTC niacin 2.  Reviewed HBG goals:  Fasting 80-130 and 1-2 hour post prandial <180.  Patient is instructed to check  BG 1 times per day.    3.  BP goal < 140/80. 4.  LDL goal of < 100, HDL > 40 and TG < 150. 5.  Eye Exam yearly and Dental Exam every 6 months. 6.  Dietary recommendations:  Reviewed CHO counting/ADA diet 7.  Physical Activity recommendations:  As able, start with 10 minutes daily and work up to 30 minutes as tolerated.  Reminded to have something to treat hypoglycemic events during increased activity / exercise. 8.  Return to clinic in 4-6 wks   Time spent counseling patient:  30 minutes  Referring provider:  Tish Men, PharmD, CPP

## 2013-06-13 ENCOUNTER — Other Ambulatory Visit: Payer: Self-pay | Admitting: Nurse Practitioner

## 2013-06-13 ENCOUNTER — Other Ambulatory Visit: Payer: Self-pay | Admitting: Family Medicine

## 2013-07-12 ENCOUNTER — Other Ambulatory Visit: Payer: Self-pay | Admitting: Family Medicine

## 2013-07-17 ENCOUNTER — Other Ambulatory Visit: Payer: Self-pay | Admitting: Family Medicine

## 2013-07-17 ENCOUNTER — Ambulatory Visit (INDEPENDENT_AMBULATORY_CARE_PROVIDER_SITE_OTHER): Payer: Medicare Other | Admitting: Family Medicine

## 2013-07-17 ENCOUNTER — Encounter: Payer: Self-pay | Admitting: Family Medicine

## 2013-07-17 VITALS — BP 128/56 | HR 57 | Temp 97.7°F | Ht 69.0 in | Wt 241.4 lb

## 2013-07-17 DIAGNOSIS — N529 Male erectile dysfunction, unspecified: Secondary | ICD-10-CM

## 2013-07-17 DIAGNOSIS — E785 Hyperlipidemia, unspecified: Secondary | ICD-10-CM

## 2013-07-17 DIAGNOSIS — I219 Acute myocardial infarction, unspecified: Secondary | ICD-10-CM | POA: Insufficient documentation

## 2013-07-17 DIAGNOSIS — M129 Arthropathy, unspecified: Secondary | ICD-10-CM

## 2013-07-17 DIAGNOSIS — A419 Sepsis, unspecified organism: Secondary | ICD-10-CM | POA: Insufficient documentation

## 2013-07-17 DIAGNOSIS — M1990 Unspecified osteoarthritis, unspecified site: Secondary | ICD-10-CM | POA: Insufficient documentation

## 2013-07-17 DIAGNOSIS — E039 Hypothyroidism, unspecified: Secondary | ICD-10-CM

## 2013-07-17 DIAGNOSIS — E119 Type 2 diabetes mellitus without complications: Secondary | ICD-10-CM

## 2013-07-17 DIAGNOSIS — I2510 Atherosclerotic heart disease of native coronary artery without angina pectoris: Secondary | ICD-10-CM

## 2013-07-17 DIAGNOSIS — N189 Chronic kidney disease, unspecified: Secondary | ICD-10-CM

## 2013-07-17 DIAGNOSIS — I10 Essential (primary) hypertension: Secondary | ICD-10-CM | POA: Insufficient documentation

## 2013-07-17 DIAGNOSIS — I214 Non-ST elevation (NSTEMI) myocardial infarction: Secondary | ICD-10-CM | POA: Insufficient documentation

## 2013-07-17 LAB — POCT GLYCOSYLATED HEMOGLOBIN (HGB A1C): Hemoglobin A1C: 7.5

## 2013-07-17 LAB — POCT UA - MICROALBUMIN: Microalbumin Ur, POC: POSITIVE mg/L

## 2013-07-17 MED ORDER — INSULIN DETEMIR 100 UNIT/ML FLEXPEN: 47.0000 [IU] | PEN_INJECTOR | Freq: Every day | SUBCUTANEOUS | Status: DC

## 2013-07-17 NOTE — Patient Instructions (Addendum)
Dr Paula Libra Recommendations  For nutrition information, I recommend books:  1).Eat to Live by Dr Excell Seltzer. 2).Prevent and Reverse Heart Disease by Dr Karl Luke. 3) Dr Janene Harvey Book:  Program to Reverse Diabetes  Exercise recommendations are:  If unable to walk, then the patient can exercise in a chair 3 times a day. By flapping arms like a bird gently and raising legs outwards to the front.  If ambulatory, the patient can go for walks for 30 minutes 3 times a week. Then increase the intensity and duration as tolerated.  Goal is to try to attain exercise frequency to 5 times a week.  If applicable: Best to perform resistance exercises (machines or weights) 2 days a week and cardio type exercises 3 days per week.   Diabetes and Foot Care Diabetes may cause you to have a poor blood supply (circulation) to your legs and feet. Because of this, the skin may be thinner, break easier, and heal more slowly. You also may have nerve damage in your legs and feet causing decreased feeling. You may not notice minor injuries to your feet that could lead to serious problems or infections. Taking care of your feet is one of the most important things you can do for yourself.  HOME CARE INSTRUCTIONS  Do not go barefoot. Bare feet are easily injured.  Check your feet daily for blisters, cuts, and redness.  Wash your feet with warm water (not hot) and mild soap. Pat your feet and between your toes until completely dry.  Apply a moisturizing lotion that does not contain alcohol or petroleum jelly to the dry skin on your feet and to dry brittle toenails. Do not put it between your toes.  Trim your toenails straight across. Do not dig under them or around the cuticle.  Do not cut corns or calluses, or try to remove them with medicine.  Wear clean cotton socks or stockings every day. Make sure they are not too tight. Do not wear knee high stockings since they may decrease  blood flow to your legs.  Wear leather shoes that fit properly and have enough cushioning. To break in new shoes, wear them just a few hours a day to avoid injuring your feet.  Wear shoes at all times, even in the house.  Do not cross your legs. This may decrease the blood flow to your feet.  If you find a minor scrape, cut, or break in the skin on your feet, keep it and the skin around it clean and dry. These areas may be cleansed with mild soap and water. Do not use peroxide, alcohol, iodine or Merthiolate.  When you remove an adhesive bandage, be sure not to harm the skin around it.  If you have a wound, look at it several times a day to make sure it is healing.  Do not use heating pads or hot water bottles. Burns can occur. If you have lost feeling in your feet or legs, you may not know it is happening until it is too late.  Report any cuts, sores or bruises to your caregiver. Do not wait! SEEK MEDICAL CARE IF:   You have an injury that is not healing or you notice redness, numbness, burning, or tingling.  Your feet always feel cold.  You have pain or cramps in your legs and feet. SEEK IMMEDIATE MEDICAL CARE IF:   There is increasing redness, swelling, or increasing pain in the wound.  There is a red line that goes up your leg.  Pus is coming from a wound.  You develop an unexplained oral temperature above 102 F (38.9 C), or as your caregiver suggests.  You notice a bad smell coming from an ulcer or wound. MAKE SURE YOU:   Understand these instructions.  Will watch your condition.  Will get help right away if you are not doing well or get worse. Document Released: 10/27/2000 Document Revised: 01/22/2012 Document Reviewed: 05/05/2009 [facility] Patient Information 2014 [place], [place].

## 2013-07-17 NOTE — Progress Notes (Signed)
Patient ID: Michael Trevino, male   DOB: 03/09/1941, 72 y.o.   MRN: [id number] SUBJECTIVE: CC: Chief Complaint  Patient presents with  . Follow-up    3 month     HPI:  Patient is here for follow up of Diabetes Mellitus/HTN/HLD/CAD Symptoms evaluated: Denies Nocturia ,Denies Urinary Frequency , denies Blurred vision ,deniesDizziness,denies.Dysuria,denies paresthesias, denies extremity pain or ulcers.Marland Kitchendenies chest pain. has had an annual eye exam. do check the feet. Does check CBGs. Average CBG:123 Denies episodes of hypoglycemia. Does have an emergency hypoglycemic plan. admits toCompliance with medications. Denies Problems with medications.  Past Medical History  Diagnosis Date  . Arthritis   . Diabetes mellitus without complication   . Hypertension   . MI (myocardial infarction) 2000   Past Surgical History  Procedure Laterality Date  . Tee without cardioversion N/A 02/26/2013    Procedure: TRANSESOPHAGEAL ECHOCARDIOGRAM (TEE);  Surgeon: Thayer Headings, MD;  Location: [facility] ENDOSCOPY;  Service: Cardiovascular;  Laterality: N/A;   History   Social History  . Marital Status: Married    Spouse Name: N/A    Number of Children: N/A  . Years of Education: N/A   Occupational History  . Not on file.   Social History Main Topics  . Smoking status: Former Smoker    Types: Cigarettes    Quit date: 11/05/1984  . Smokeless tobacco: Never Used  . Alcohol Use: No  . Drug Use: No  . Sexual Activity: Not on file   Other Topics Concern  . Not on file   Social History Narrative  . No narrative on file   No family history on file. Current Outpatient Prescriptions on File Prior to Visit  Medication Sig Dispense Refill  . amLODipine (NORVASC) 10 MG tablet TAKE 1 TABLET BY MOUTH ONCE A DAY  30 tablet  3  . ascorbic acid (VITAMIN C) 500 MG tablet Take 500 mg by mouth daily.      Marland Kitchen aspirin 325 MG EC tablet Take 325 mg by mouth daily.      . benazepril (LOTENSIN) 40 MG tablet TAKE 1  TABLET EVERY DAY  30 tablet  5  . ezetimibe (ZETIA) 10 MG tablet Take 1 tablet (10 mg total) by mouth daily.  90 tablet  2  . furosemide (LASIX) 40 MG tablet Take 40 mg by mouth 2 (two) times daily.        Marland Kitchen glimepiride (AMARYL) 4 MG tablet TAKE 1 TABLET BY MOUTH 2 TIMES DAILY  60 tablet  2  . Insulin Detemir (LEVEMIR FLEXPEN) 100 UNIT/ML SOPN Inject 47 Units into the skin daily.  45 mL  2  . levothyroxine (SYNTHROID, LEVOTHROID) 100 MCG tablet TAKE 1 TABLET (100 MCG TOTAL) BY MOUTH DAILY.  30 tablet  0  . linagliptin (TRADJENTA) 5 MG TABS tablet Take 1 tablet (5 mg total) by mouth daily.  90 tablet  2  . metoprolol (LOPRESSOR) 100 MG tablet TAKE 1 TABLET TWICE A DAY  60 tablet  3  . pioglitazone (ACTOS) 30 MG tablet Take 30 mg by mouth daily.        . rosuvastatin (CRESTOR) 20 MG tablet Take 1 tablet (20 mg total) by mouth daily.  90 tablet  2   No current facility-administered medications on file prior to visit.   Allergies  Allergen Reactions  . Zocor [Simvastatin - High Dose]     Unknown   Immunization History  Administered Date(s) Administered  . Influenza Whole 08/25/2008  . Pneumococcal Polysaccharide 10/06/2005,  12/12/2007   Prior to Admission medications   Medication Sig Start Date End Date Taking? Authorizing Provider  amLODipine (NORVASC) 10 MG tablet TAKE 1 TABLET BY MOUTH ONCE A DAY 06/10/13  Yes Vernie Shanks, MD  ascorbic acid (VITAMIN C) 500 MG tablet Take 500 mg by mouth daily.   Yes Historical Provider, MD  aspirin 325 MG EC tablet Take 325 mg by mouth daily.   Yes Historical Provider, MD  benazepril (LOTENSIN) 40 MG tablet TAKE 1 TABLET EVERY DAY 03/17/13  Yes Chipper Herb, MD  ezetimibe (ZETIA) 10 MG tablet Take 1 tablet (10 mg total) by mouth daily. 05/15/13  Yes Vernie Shanks, MD  furosemide (LASIX) 40 MG tablet Take 40 mg by mouth 2 (two) times daily.     Yes Historical Provider, MD  glimepiride (AMARYL) 4 MG tablet TAKE 1 TABLET BY MOUTH 2 TIMES DAILY 07/12/13   Yes Vernie Shanks, MD  Insulin Detemir (LEVEMIR FLEXPEN) 100 UNIT/ML SOPN Inject 47 Units into the skin daily. 06/12/13  Yes Tammy Eckard, PHARMD  levothyroxine (SYNTHROID, LEVOTHROID) 100 MCG tablet TAKE 1 TABLET (100 MCG TOTAL) BY MOUTH DAILY. 06/13/13   Vernie Shanks, MD  linagliptin (TRADJENTA) 5 MG TABS tablet Take 1 tablet (5 mg total) by mouth daily. 05/15/13   Vernie Shanks, MD  metoprolol (LOPRESSOR) 100 MG tablet TAKE 1 TABLET TWICE A DAY 06/05/13   Vernie Shanks, MD  pioglitazone (ACTOS) 30 MG tablet Take 30 mg by mouth daily.      Historical Provider, MD  rosuvastatin (CRESTOR) 20 MG tablet Take 1 tablet (20 mg total) by mouth daily. 05/15/13   Vernie Shanks, MD     ROS: As above in the HPI. All other systems are stable or negative.  OBJECTIVE: APPEARANCE:  Patient in no acute distress.The patient appeared well nourished and normally developed. Acyanotic. Waist: VITAL SIGNS:BP 128/56  Pulse 57  Temp(Src) 97.7 F (36.5 C) (Oral)  Ht 5\' 9"  (1.753 m)  Wt 241 lb 6.4 oz (109.498 kg)  BMI 35.63 kg/m2 Obese WM  SKIN: warm and  Dry without overt rashes, tattoos and scars  HEAD and Neck: without JVD, Head and scalp: normal Eyes:No scleral icterus. Fundi normal, eye movements normal. Ears: Auricle normal, canal normal, Tympanic membranes normal, insufflation normal. Nose: normal Throat: normal Neck & thyroid: normal  CHEST & LUNGS: Chest wall: normal Lungs: Clear  CVS: Reveals the PMI to be normally located. Regular rhythm, First and Second Heart sounds are normal,  absence of murmurs, rubs or gallops. Peripheral vasculature: Radial pulses: normal Dorsal pedis pulses: normal Posterior pulses: normal  ABDOMEN:  Appearance: Obese Benign, no organomegaly, no masses, no Abdominal Aortic enlargement. No Guarding , no rebound. No Bruits. Bowel sounds: normal  RECTAL: N/A GU: N/A  EXTREMETIES: nonedematous.  MUSCULOSKELETAL:  Spine: normal Joints:  intact  NEUROLOGIC: oriented to time,place and person; nonfocal. Strength is normal Sensory is normal Reflexes are normal Cranial Nerves are normal.  ASSESSMENT: CKD (chronic kidney disease) - Plan: CMP14+EGFR  Diabetes mellitus without complication - Plan: 0000000, POCT glycosylated hemoglobin (Hb A1C), POCT UA - Microalbumin, Insulin Detemir (LEVEMIR FLEXPEN) 100 UNIT/ML SOPN, Microalbumin, urine  Erectile dysfunction  HTN (hypertension)  HLD (hyperlipidemia) - Plan: CMP14+EGFR, NMR, lipoprofile  CAD (coronary artery disease)  Arthritis  Impotence  Hypothyroid - Plan: TSH  DM (diabetes mellitus) - Plan: Insulin Detemir (LEVEMIR FLEXPEN) 100 UNIT/ML SOPN   PLAN: Orders Placed This Encounter  Procedures  .  CMP14+EGFR  . NMR, lipoprofile  . TSH  . Microalbumin, urine  . POCT glycosylated hemoglobin (Hb A1C)  . POCT UA - Microalbumin   Meds ordered this encounter  Medications  . Insulin Detemir (LEVEMIR FLEXPEN) 100 UNIT/ML SOPN    Sig: Inject 47 Units into the skin daily.    Dispense:  45 mL    Refill:  2   .     Dr Paula Libra Recommendations  For nutrition information, I recommend books:  1).Eat to Live by Dr Excell Seltzer. 2).Prevent and Reverse Heart Disease by Dr Karl Luke. 3) Dr Janene Harvey Book:  Program to Reverse Diabetes  Exercise recommendations are:  If unable to walk, then the patient can exercise in a chair 3 times a day. By flapping arms like a bird gently and raising legs outwards to the front.  If ambulatory, the patient can go for walks for 30 minutes 3 times a week. Then increase the intensity and duration as tolerated.  Goal is to try to attain exercise frequency to 5 times a week.  If applicable: Best to perform resistance exercises (machines or weights) 2 days a week and cardio type exercises 3 days per week.  Foot care discussed and handout in the AVS Annual eye exam.  Return in about 3 months (around  10/16/2013) for Recheck medical problems.  Bhavya Grand P. Jacelyn Grip, M.D.

## 2013-07-18 LAB — MICROALBUMIN, URINE: Microalbumin, Urine: 1617.2 ug/mL — ABNORMAL HIGH (ref 0.0–17.0)

## 2013-07-19 LAB — NMR, LIPOPROFILE
Cholesterol: 100 mg/dL (ref <200)
HDL Cholesterol by NMR: 31 mg/dL — ABNORMAL LOW (ref >=40)
HDL Particle Number: 23.5 umol/L — ABNORMAL LOW (ref >=30.5)
LDL Particle Number: 1129 nmol/L — ABNORMAL HIGH (ref <1000)
LDL Size: 19.8 nm — ABNORMAL LOW (ref >20.5)
LDLC SERPL CALC-MCNC: 49 mg/dL (ref <100)
LP-IR Score: 62 — ABNORMAL HIGH (ref <=45)
Small LDL Particle Number: 933 nmol/L — ABNORMAL HIGH (ref <=527)
Triglycerides by NMR: 98 mg/dL (ref <150)

## 2013-07-19 LAB — CMP14+EGFR
ALT: 18 IU/L (ref 0–44)
AST: 20 IU/L (ref 0–40)
Albumin/Globulin Ratio: 1.4 (ref 1.1–2.5)
Albumin: 3.9 g/dL (ref 3.5–4.8)
Alkaline Phosphatase: 75 IU/L (ref 39–117)
BUN/Creatinine Ratio: 9 — ABNORMAL LOW (ref 10–22)
BUN: 15 mg/dL (ref 8–27)
CO2: 22 mmol/L (ref 18–29)
Calcium: 8.5 mg/dL — ABNORMAL LOW (ref 8.6–10.2)
Chloride: 105 mmol/L (ref 97–108)
Creatinine, Ser: 1.61 mg/dL — ABNORMAL HIGH (ref 0.76–1.27)
GFR calc Af Amer: 49 mL/min/{1.73_m2} — ABNORMAL LOW (ref >59)
GFR calc non Af Amer: 42 mL/min/{1.73_m2} — ABNORMAL LOW (ref >59)
Globulin, Total: 2.7 g/dL (ref 1.5–4.5)
Glucose: 111 mg/dL — ABNORMAL HIGH (ref 65–99)
Potassium: 4.4 mmol/L (ref 3.5–5.2)
Sodium: 141 mmol/L (ref 134–144)
Total Bilirubin: 0.3 mg/dL (ref 0.0–1.2)
Total Protein: 6.6 g/dL (ref 6.0–8.5)

## 2013-07-19 LAB — TSH: TSH: 3.71 u[IU]/mL (ref 0.450–4.500)

## 2013-07-22 ENCOUNTER — Telehealth: Payer: Self-pay | Admitting: Pharmacist

## 2013-07-23 NOTE — Telephone Encounter (Signed)
Left #2 pens of Lantus Solostar pen samples for patient in laboratory refrig.   I called pt to notify him and explained that he will inject the same amount of units with Lantus as he did with Levemir.   He repeated that he understood.

## 2013-07-28 ENCOUNTER — Encounter: Payer: Self-pay | Admitting: *Deleted

## 2013-09-08 ENCOUNTER — Other Ambulatory Visit: Payer: Self-pay | Admitting: Family Medicine

## 2013-09-11 ENCOUNTER — Ambulatory Visit (INDEPENDENT_AMBULATORY_CARE_PROVIDER_SITE_OTHER): Payer: Medicare Other | Admitting: Pharmacist

## 2013-09-11 VITALS — BP 140/60 | HR 66 | Ht 69.0 in | Wt 248.0 lb

## 2013-09-11 DIAGNOSIS — E785 Hyperlipidemia, unspecified: Secondary | ICD-10-CM

## 2013-09-11 DIAGNOSIS — R809 Proteinuria, unspecified: Secondary | ICD-10-CM

## 2013-09-11 DIAGNOSIS — E1129 Type 2 diabetes mellitus with other diabetic kidney complication: Secondary | ICD-10-CM

## 2013-09-11 NOTE — Progress Notes (Addendum)
Diabetes Follow-Up Visit Chief Complaint:   Chief Complaint  Patient presents with  . Diabetes     Filed Vitals:   09/11/13 0807  BP: 140/60  Pulse: 66     HPI:  Patient with long standing diabetes.   Last a1c of 7.5% from 07/2013 showed improved but still sub optimal control .  (a1c was 8.5% 03/2013).  At out last visit we decreased Levemir due to patient having nocturnal hypoglycemia.  He states that he has not had any hypoglycemic events in the last 3 months.  Mr. Kleinhans has reached the Medicare coverage gap.  We applied to [facility] in July but he unfortunately did not qualify.  We have been giving patient samples of medication to help when available.  He states he has not missed any medications with the exception of Zetia for the last 1 week.    Current Diabetes Medications:  Lantus/Levemir 50 units daily, glimepiride 4mg  bid, pioglitazone 30mg  daily, Tradgenta 5mg  daily  Low fat/carbohydrate diet?  Improved since last visit.  Trying to limit high CHO foods.   Nicotine Abuse?  No Medication Compliance?  Yes Exercise?  No Alcohol Abuse?  No  Home BG Monitoring:  Checking  3 times a week Samples HBG readings per patient 126 - 149.  Patient did not bring in glucometer today   Exam Edema:  negative   Polyuria:  negative Polydipsia:  negative Polyphagia:  negative  BMI:  Body mass index is 36.61 kg/(m^2).   Weight changes:  stable General Appearance:  alert, oriented, no acute distress Mood/Affect:  normal  Foot Exam performed today - see notes and documentation      Lab Results  Component Value Date   HGBA1C 7.5 07/17/2013    No results found for this basenameDerl Barrow    Lab Results  Component Value Date   CHOL 100 07/17/2013   LDLCALC 49 04/11/2013   TRIG 131 04/11/2013      Assessment: 1.  Diabetes.  Uncontrolled but continues to improve 2.  Blood Pressure.  SBP slightly elevated today 3.  Lipids.  Last LDL-P  was    Recommendations: 1.  Medication recommendations at this time are as follows:  Continue all current medications 2.  Reviewed HBG goals:  Fasting 80-130 and 1-2 hour post prandial <180.  Patient is instructed to check BG 1 times per day.    3.  BP goal < 140/80. 4.  LDL goal of < 100, HDL > 40 and TG < 150. 5.  Eye Exam yearly and Dental Exam every 6 months. 6.  Dietary recommendations:  Reviewed diet - patient to work harder to reduce CHO and fat intake 7.  Physical Activity recommendations:  As able, start with 10 minutes daily and work up to 30 minutes as tolerated.  Reminded to have something to treat hypoglycemic events during increased activity / exercise. 8.  Return to clinic in 4-6 wks - Dr Jacelyn Grip.  F/U with [facility] - January 2015.   Time spent counseling patient:  30 minutes  Referring provider:  Tish Men, PharmD, CPP           Correction in Levemir / Lantus dosing - patient is taking 47 units daily.

## 2013-09-15 ENCOUNTER — Other Ambulatory Visit: Payer: Self-pay | Admitting: Family Medicine

## 2013-10-06 ENCOUNTER — Other Ambulatory Visit: Payer: Self-pay | Admitting: Family Medicine

## 2013-10-08 ENCOUNTER — Other Ambulatory Visit: Payer: Self-pay | Admitting: Family Medicine

## 2013-10-10 ENCOUNTER — Other Ambulatory Visit: Payer: Self-pay | Admitting: Family Medicine

## 2013-10-13 ENCOUNTER — Other Ambulatory Visit: Payer: Self-pay | Admitting: *Deleted

## 2013-10-13 MED ORDER — METOPROLOL TARTRATE 100 MG PO TABS: 100.0000 mg | ORAL_TABLET | Freq: Two times a day (BID) | ORAL | Status: DC

## 2013-10-29 ENCOUNTER — Ambulatory Visit: Payer: Medicare Other | Admitting: Family Medicine

## 2013-10-30 ENCOUNTER — Encounter: Payer: Self-pay | Admitting: Family Medicine

## 2013-10-30 ENCOUNTER — Ambulatory Visit (INDEPENDENT_AMBULATORY_CARE_PROVIDER_SITE_OTHER): Payer: Medicare Other | Admitting: Family Medicine

## 2013-10-30 VITALS — BP 137/56 | HR 55 | Temp 97.0°F | Ht 69.0 in | Wt 239.6 lb

## 2013-10-30 DIAGNOSIS — N058 Unspecified nephritic syndrome with other morphologic changes: Secondary | ICD-10-CM

## 2013-10-30 DIAGNOSIS — Z550 Illiteracy and low-level literacy: Secondary | ICD-10-CM

## 2013-10-30 DIAGNOSIS — M129 Arthropathy, unspecified: Secondary | ICD-10-CM

## 2013-10-30 DIAGNOSIS — N529 Male erectile dysfunction, unspecified: Secondary | ICD-10-CM

## 2013-10-30 DIAGNOSIS — Z559 Problems related to education and literacy, unspecified: Secondary | ICD-10-CM

## 2013-10-30 DIAGNOSIS — I10 Essential (primary) hypertension: Secondary | ICD-10-CM

## 2013-10-30 DIAGNOSIS — E039 Hypothyroidism, unspecified: Secondary | ICD-10-CM

## 2013-10-30 DIAGNOSIS — IMO0002 Reserved for concepts with insufficient information to code with codable children: Secondary | ICD-10-CM

## 2013-10-30 DIAGNOSIS — I2510 Atherosclerotic heart disease of native coronary artery without angina pectoris: Secondary | ICD-10-CM

## 2013-10-30 DIAGNOSIS — I219 Acute myocardial infarction, unspecified: Secondary | ICD-10-CM

## 2013-10-30 DIAGNOSIS — M1990 Unspecified osteoarthritis, unspecified site: Secondary | ICD-10-CM

## 2013-10-30 DIAGNOSIS — E785 Hyperlipidemia, unspecified: Secondary | ICD-10-CM

## 2013-10-30 DIAGNOSIS — N189 Chronic kidney disease, unspecified: Secondary | ICD-10-CM

## 2013-10-30 DIAGNOSIS — E1129 Type 2 diabetes mellitus with other diabetic kidney complication: Secondary | ICD-10-CM

## 2013-10-30 LAB — POCT GLYCOSYLATED HEMOGLOBIN (HGB A1C): Hemoglobin A1C: 7.5

## 2013-10-30 LAB — POCT UA - MICROALBUMIN: Microalbumin Ur, POC: 100 mg/L

## 2013-10-30 NOTE — Patient Instructions (Signed)
Dr Paula Libra Recommendations  For nutrition information, I recommend books:  1).Eat to Live by Dr Excell Seltzer. 2).Prevent and Reverse Heart Disease by Dr Karl Luke. 3) Dr Janene Harvey Book:  Program to Reverse Diabetes  Exercise recommendations are:  If unable to walk, then the patient can exercise in a chair 3 times a day. By flapping arms like a bird gently and raising legs outwards to the front.  If ambulatory, the patient can go for walks for 30 minutes 3 times a week. Then increase the intensity and duration as tolerated.  Goal is to try to attain exercise frequency to 5 times a week.  If applicable: Best to perform resistance exercises (machines or weights) 2 days a week and cardio type exercises 3 days per week.   Diabetes and Exercise Exercising regularly is important. It is not just about losing weight. It has many health benefits, such as:  Improving your overall fitness, flexibility, and endurance.  Increasing your bone density.  Helping with weight control.  Decreasing your body fat.  Increasing your muscle strength.  Reducing stress and tension.  Improving your overall health. People with diabetes who exercise gain additional benefits because exercise:  Reduces appetite.  Improves the body's use of blood sugar (glucose).  Helps lower or control blood glucose.  Decreases blood pressure.  Helps control blood lipids (such as cholesterol and triglycerides).  Improves the body's use of the hormone insulin by:  Increasing the body's insulin sensitivity.  Reducing the body's insulin needs.  Decreases the risk for heart disease because exercising:  Lowers cholesterol and triglycerides levels.  Increases the levels of good cholesterol (such as high-density lipoproteins [HDL]) in the body.  Lowers blood glucose levels. YOUR ACTIVITY PLAN  Choose an activity that you enjoy and set realistic goals. Your health care provider or  diabetes educator can help you make an activity plan that works for you. You can break activities into 2 or 3 sessions throughout the day. Doing so is as good as one long session. Exercise ideas include:  Taking the dog for a walk.  Taking the stairs instead of the elevator.  Dancing to your favorite song.  Doing your favorite exercise with a friend. RECOMMENDATIONS FOR EXERCISING WITH TYPE 1 OR TYPE 2 DIABETES   Check your blood glucose before exercising. If blood glucose levels are greater than 240 mg/dL, check for urine ketones. Do not exercise if ketones are present.  Avoid injecting insulin into areas of the body that are going to be exercised. For example, avoid injecting insulin into:  The arms when playing tennis.  The legs when jogging.  Keep a record of:  Food intake before and after you exercise.  Expected peak times of insulin action.  Blood glucose levels before and after you exercise.  The type and amount of exercise you have done.  Review your records with your health care provider. Your health care provider will help you to develop guidelines for adjusting food intake and insulin amounts before and after exercising.  If you take insulin or oral hypoglycemic agents, watch for signs and symptoms of hypoglycemia. They include:  Dizziness.  Shaking.  Sweating.  Chills.  Confusion.  Drink plenty of water while you exercise to prevent dehydration or heat stroke. Body water is lost during exercise and must be replaced.  Talk to your health care provider before starting an exercise program to make sure it is safe for you. Remember, almost any type  of activity is better than none. Document Released: 01/20/2004 Document Revised: 07/02/2013 Document Reviewed: 04/08/2013 [facility] Patient Information 2014 [place].   Diabetes and Foot Care Diabetes may cause you to have problems because of poor blood supply (circulation) to your feet and legs. This may  cause the skin on your feet to become thinner, break easier, and heal more slowly. Your skin may become dry, and the skin may peel and crack. You may also have nerve damage in your legs and feet causing decreased feeling in them. You may not notice minor injuries to your feet that could lead to infections or more serious problems. Taking care of your feet is one of the most important things you can do for yourself.  HOME CARE INSTRUCTIONS  Wear shoes at all times, even in the house. Do not go barefoot. Bare feet are easily injured.  Check your feet daily for blisters, cuts, and redness. If you cannot see the bottom of your feet, use a mirror or ask someone for help.  Wash your feet with warm water (do not use hot water) and mild soap. Then pat your feet and the areas between your toes until they are completely dry. Do not soak your feet as this can dry your skin.  Apply a moisturizing lotion or petroleum jelly (that does not contain alcohol and is unscented) to the skin on your feet and to dry, brittle toenails. Do not apply lotion between your toes.  Trim your toenails straight across. Do not dig under them or around the cuticle. File the edges of your nails with an emery board or nail file.  Do not cut corns or calluses or try to remove them with medicine.  Wear clean socks or stockings every day. Make sure they are not too tight. Do not wear knee-high stockings since they may decrease blood flow to your legs.  Wear shoes that fit properly and have enough cushioning. To break in new shoes, wear them for just a few hours a day. This prevents you from injuring your feet. Always look in your shoes before you put them on to be sure there are no objects inside.  Do not cross your legs. This may decrease the blood flow to your feet.  If you find a minor scrape, cut, or break in the skin on your feet, keep it and the skin around it clean and dry. These areas may be cleansed with mild soap and water.  Do not cleanse the area with peroxide, alcohol, or iodine.  When you remove an adhesive bandage, be sure not to damage the skin around it.  If you have a wound, look at it several times a day to make sure it is healing.  Do not use heating pads or hot water bottles. They may burn your skin. If you have lost feeling in your feet or legs, you may not know it is happening until it is too late.  Make sure your health care provider performs a complete foot exam at least annually or more often if you have foot problems. Report any cuts, sores, or bruises to your health care provider immediately. SEEK MEDICAL CARE IF:   You have an injury that is not healing.  You have cuts or breaks in the skin.  You have an ingrown nail.  You notice redness on your legs or feet.  You feel burning or tingling in your legs or feet.  You have pain or cramps in your legs and feet.  Your legs or feet are numb.  Your feet always feel cold. SEEK IMMEDIATE MEDICAL CARE IF:   There is increasing redness, swelling, or pain in or around a wound.  There is a red line that goes up your leg.  Pus is coming from a wound.  You develop a fever or as directed by your health care provider.  You notice a bad smell coming from an ulcer or wound. Document Released: 10/27/2000 Document Revised: 07/02/2013 Document Reviewed: 04/08/2013 [facility] Patient Information 2014 [place].   Diabetic Nephropathy Diabetic nephropathy is a complication of diabetes that leads to damaged kidneys. It develops slowly. The function of healthy kidneys is to filter and clean blood. Kidneys also get rid of body waste products and extra fluid. When the kidney filters are damaged, there is protein loss in the urine, a decline in kidney function, a buildup of kidney waste products and fluid, and high blood pressure. The damage progresses until the kidneys fail.  RISK FACTORS  High blood pressure (hypertension).  High blood sugar  (hyperglycemia).  Family history.  Aging.  Obstruction problems affecting the kidneys, the tubes that drain the kidneys (ureters), or the bladder.  Taking certain drugs or medicines. SYMPTOMS  Symptoms may not be seen or felt for many years. You may not notice any signs of kidney failure until your kidneys have lost much of their ability to function. An early sign of damage is when small amounts of protein (albumin) leak into the urine. However, this can only be found through a urine test. Without physical symptoms, a urine test is often not performed. When the kidneys fail, you may feel one or more of the following:  Swelling of the hands and feet from the extra fluid in your body.  Constant upset stomach.  Constant fatigue. DIAGNOSIS When someone has diabetes, screening tests are done to look for any early signs of problems before symptoms develop and before damage has already been done. These tests may include:   Annual urine tests to screen for trace amounts of protein in the urine (microalbuminuria).  Urine collectionover 24 hours to measure kidney function.  Blood tests that measure kidney function. Your caregiver is aware that problems other than diabetes can damage kidneys. If screening tests show early kidney damage, but it is thought that a different problem is causing the damage, other tests may be performed. Examples of these tests include:  An ultrasound of your kidney.  Taking a tissue sample (biopsy) from the kidney. TREATMENT The goal of treatment is to prevent or slow down damage to your kidneys. Controlling hypertension and hyperglycemia is critical. Your goal is to maintain a blood pressure below 120/80. If you have certain other medical problems, this goal may be different. Talk to your caregiver to make sure that your blood pressure goal is right for your needs. Regular testing of your blood glucose at home is important. Your goal is to have a normal blood  glucose (110 or less when fasting) as often as possible.  In addition, maintaining your hemoglobin A1c level at less than 7% reduces your risk for complications, including kidney damage. Common treatments include:  Dieting by controlling what you eat as well as the portion sizes.  Exercising to control blood pressure and blood glucose.  Taking medicines.  Giving yourself insulin injections if your caregiver feels that it is necessary.  Getting early treatment for urinary tract infections.  Regularly following up with your caregiver. If your disease progresses to end-stage  kidney failure, you will need dialysis or a transplant. Dialysis can be done in 1 of 2 ways:  Hemodialysis. Your blood flows from a tube in your arm through a machine. The machine filters waste and extra fluid. The clean blood flows back into your arm.  Peritoneal dialysis. Your abdomen is filled with a special fluid. The fluid collects waste products and extra fluid from your blood. The fluid is then drained from your abdomen and discarded. SEEK MEDICAL CARE IF:   You are having problems keeping your blood glucose in the goal range.  You have swelling of the hands or feet.  You have weakness.  You have muscles spasms.  You have a constant upset stomach.  You feel tired all the time and this is not normal for you. SEEK IMMEDIATE MEDICAL CARE IF:  You have unusual dizziness or weakness.  You have excessive sleepiness.  You have a seizure or convulsion.  You have severe, painful muscle spasms.  You have shortness of breath or trouble breathing.  You pass out or have a fainting episode.  You have chest pains. MAKE SURE YOU:  Understand these instructions.  Will watch your condition.  Will get help right away if you are not doing well or get worse. Document Released: 11/19/2007 Document Revised: 07/02/2013 Document Reviewed: 06/21/2011 [facility] Patient Information 2014 [place], [place].

## 2013-10-30 NOTE — Progress Notes (Signed)
Patient ID: Michael Trevino, male   DOB: [birth year], 72 y.o.   MRN: [id number] SUBJECTIVE: CC: Chief Complaint  Patient presents with  . Follow-up    3 MONTH CHRONIC HEALTH    HPI:  Patient is here for follow up of Diabetes Mellitus/HTN/HLD/Obesity/CAD: Symptoms evaluated: Denies Nocturia ,Denies Urinary Frequency , denies Blurred vision ,deniesDizziness,denies.Dysuria,denies paresthesias, denies extremity pain or ulcers.Marland Kitchendenies chest pain. has had an annual eye exam. do check the feet. Does check CBGs. Average CBG:126 Denies episodes of hypoglycemia. Does have an emergency hypoglycemic plan. admits toCompliance with medications. Denies Problems with medications.  Claims he had the flu shot 3 months ago.   Past Medical History  Diagnosis Date  . Hyperlipidemia   . Hypertension   . MI (myocardial infarction) 2000  . Diabetes mellitus without complication   . Arthritis   . Sepsis   . Thyroid disease     hypothyroid   Past Surgical History  Procedure Laterality Date  . Tee without cardioversion N/A 02/26/2013    Procedure: TRANSESOPHAGEAL ECHOCARDIOGRAM (TEE);  Surgeon: Thayer Headings, MD;  Location[facility] ENDOSCOPY;  Service: Cardiovascular;  Laterality: N/A;   History   Social History  . Marital Status: Married    Spouse Name: N/A    Number of Children: N/A  . Years of Education: N/A   Occupational History  . Not on file.   Social History Main Topics  . Smoking status: Former Smoker    Types: Cigarettes    Quit date: 11/05/1984  . Smokeless tobacco: Never Used  . Alcohol Use: No  . Drug Use: No  . Sexual Activity: Not on file   Other Topics Concern  . Not on file   Social History Narrative  . No narrative on file   Family History  Problem Relation Age of Onset  . Diabetes Mother   . Cancer Father   . Stroke Brother   . Diabetes Brother    Current Outpatient Prescriptions on File Prior to Visit  Medication Sig Dispense Refill  . amLODipine (NORVASC)  10 MG tablet TAKE 1 TABLET BY MOUTH ONCE A DAY  30 tablet  3  . ascorbic acid (VITAMIN C) 500 MG tablet Take 500 mg by mouth daily.      Marland Kitchen aspirin 325 MG EC tablet Take 325 mg by mouth daily.      . benazepril (LOTENSIN) 40 MG tablet TAKE 1 TABLET EVERY DAY  30 tablet  3  . ezetimibe (ZETIA) 10 MG tablet Take 1 tablet (10 mg total) by mouth daily.  90 tablet  2  . furosemide (LASIX) 40 MG tablet Take 40 mg by mouth 2 (two) times daily.        Marland Kitchen glimepiride (AMARYL) 4 MG tablet TAKE 1 TABLET BY MOUTH 2 TIMES DAILY  60 tablet  1  . Insulin Detemir (LEVEMIR FLEXPEN) 100 UNIT/ML SOPN Inject 47 Units into the skin daily.  45 mL  2  . levothyroxine (SYNTHROID, LEVOTHROID) 100 MCG tablet TAKE 1 TABLET (100 MCG TOTAL) BY MOUTH DAILY.  30 tablet  11  . linagliptin (TRADJENTA) 5 MG TABS tablet Take 1 tablet (5 mg total) by mouth daily.  90 tablet  2  . metoprolol (LOPRESSOR) 100 MG tablet Take 1 tablet (100 mg total) by mouth 2 (two) times daily.  60 tablet  2  . pioglitazone (ACTOS) 30 MG tablet Take 30 mg by mouth daily.        . rosuvastatin (CRESTOR) 20 MG tablet Take  1 tablet (20 mg total) by mouth daily.  90 tablet  2   No current facility-administered medications on file prior to visit.   Allergies  Allergen Reactions  . Zocor [Simvastatin - High Dose]     Unknown   Immunization History  Administered Date(s) Administered  . Influenza Whole 08/25/2008  . Pneumococcal Polysaccharide-23 10/06/2005, 12/12/2007   Prior to Admission medications   Medication Sig Start Date End Date Taking? Authorizing Provider  amLODipine (NORVASC) 10 MG tablet TAKE 1 TABLET BY MOUTH ONCE A DAY 10/06/13   Vernie Shanks, MD  ascorbic acid (VITAMIN C) 500 MG tablet Take 500 mg by mouth daily.    Historical Provider, MD  aspirin 325 MG EC tablet Take 325 mg by mouth daily.    Historical Provider, MD  benazepril (LOTENSIN) 40 MG tablet TAKE 1 TABLET EVERY DAY 09/15/13   Vernie Shanks, MD  ezetimibe (ZETIA) 10 MG  tablet Take 1 tablet (10 mg total) by mouth daily. 05/15/13   Vernie Shanks, MD  furosemide (LASIX) 40 MG tablet Take 40 mg by mouth 2 (two) times daily.      Historical Provider, MD  glimepiride (AMARYL) 4 MG tablet TAKE 1 TABLET BY MOUTH 2 TIMES DAILY 10/08/13   Vernie Shanks, MD  Insulin Detemir (LEVEMIR FLEXPEN) 100 UNIT/ML SOPN Inject 47 Units into the skin daily. 07/17/13   Vernie Shanks, MD  levothyroxine (SYNTHROID, LEVOTHROID) 100 MCG tablet TAKE 1 TABLET (100 MCG TOTAL) BY MOUTH DAILY. 07/17/13   Vernie Shanks, MD  linagliptin (TRADJENTA) 5 MG TABS tablet Take 1 tablet (5 mg total) by mouth daily. 05/15/13   Vernie Shanks, MD  metoprolol (LOPRESSOR) 100 MG tablet Take 1 tablet (100 mg total) by mouth 2 (two) times daily. 10/13/13   Chipper Herb, MD  pioglitazone (ACTOS) 30 MG tablet Take 30 mg by mouth daily.      Historical Provider, MD  rosuvastatin (CRESTOR) 20 MG tablet Take 1 tablet (20 mg total) by mouth daily. 05/15/13   Vernie Shanks, MD     ROS: As above in the HPI. All other systems are stable or negative.  OBJECTIVE: APPEARANCE:  Patient in no acute distress.The patient appeared well nourished and normally developed. Acyanotic. Waist:44.25 inches VITAL SIGNS:BP 137/56  Pulse 55  Temp(Src) 97 F (36.1 C) (Oral)  Ht 5\' 9"  (1.753 m)  Wt 239 lb 9.6 oz (108.682 kg)  BMI 35.37 kg/m2 AAM obese  SKIN: warm and  Dry without overt rashes, tattoos and scars  HEAD and Neck: without JVD, Head and scalp: normal Eyes:No scleral icterus. Fundi normal, eye movements normal.bilateral small cataracts inferiorly Ears: Auricle normal, canal normal, Tympanic membranes normal, insufflation normal. Nose: normal Throat: normal Neck & thyroid: normal  CHEST & LUNGS: Chest wall: normal Lungs: Clear  CVS: Reveals the PMI to be normally located. Regular rhythm, First and Second Heart sounds are normal,  absence of murmurs, rubs or gallops. Peripheral vasculature: Radial pulses:  normal Dorsal pedis pulses: normal Posterior pulses: normal  ABDOMEN:  Appearance: Obese Benign, no organomegaly, no masses, no Abdominal Aortic enlargement. No Guarding , no rebound. No Bruits. Bowel sounds: normal  RECTAL: N/A GU: N/A  EXTREMETIES: nonedematous.  MUSCULOSKELETAL:  Spine: normal Joints: intact  NEUROLOGIC: oriented to time,place and person; nonfocal. Strength is normal Sensory is normal Reflexes are normal Cranial Nerves are normal.  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 2).Prevent and Reverse Heart Disease by Dr Karl Luke. 3) Dr Janene Harvey Book:  Program to Reverse Diabetes  Exercise recommendations are:  If unable to walk, then the patient can exercise in a chair 3 times a day. By flapping arms like a bird gently and raising legs outwards to the front.  If ambulatory, the patient can go for walks for 30 minutes 3 times a week. Then increase the intensity and duration as tolerated.  Goal is to try to attain exercise frequency to 5 times a week.  If applicable: Best to perform resistance exercises (machines or weights) 2 days a week and cardio type exercises 3 days per week.  Hand out on DM care discussed with patient. See AVS.  Orders Placed This Encounter  Procedures  . CMP14+EGFR  . Lipid panel  . TSH  . Vit D  25 hydroxy (rtn osteoporosis monitoring)  .  Microalbumin, urine  . POCT glycosylated hemoglobin (Hb A1C)  . POCT UA - Microalbumin  same medications. Eye exam emphasized in view of small cataracts seen. Weight loss and dietary changes and exercise. He has seen Tammy in the past and struggles with the exercise and  Dietary changes needed. No orders of the defined types were placed in this encounter.   There are no discontinued medications. Return in about 3 months (around 01/28/2014) for Recheck medical problems.  Joshuah Minella P. Jacelyn Grip, M.D.

## 2013-10-31 LAB — CMP14+EGFR
ALT: 22 IU/L (ref 0–44)
AST: 18 IU/L (ref 0–40)
Albumin/Globulin Ratio: 1.3 (ref 1.1–2.5)
Albumin: 3.8 g/dL (ref 3.5–4.8)
Alkaline Phosphatase: 75 IU/L (ref 39–117)
BUN/Creatinine Ratio: 11 (ref 10–22)
BUN: 17 mg/dL (ref 8–27)
CO2: 23 mmol/L (ref 18–29)
Calcium: 8.6 mg/dL (ref 8.6–10.2)
Chloride: 107 mmol/L (ref 97–108)
Creatinine, Ser: 1.54 mg/dL — ABNORMAL HIGH (ref 0.76–1.27)
GFR calc Af Amer: 51 mL/min/{1.73_m2} — ABNORMAL LOW (ref >59)
GFR calc non Af Amer: 44 mL/min/{1.73_m2} — ABNORMAL LOW (ref >59)
Globulin, Total: 2.9 g/dL (ref 1.5–4.5)
Glucose: 91 mg/dL (ref 65–99)
Potassium: 4.4 mmol/L (ref 3.5–5.2)
Sodium: 144 mmol/L (ref 134–144)
Total Bilirubin: 0.3 mg/dL (ref 0.0–1.2)
Total Protein: 6.7 g/dL (ref 6.0–8.5)

## 2013-10-31 LAB — LIPID PANEL
Chol/HDL Ratio: 3.6 ratio units (ref 0.0–5.0)
Cholesterol, Total: 108 mg/dL (ref 100–199)
HDL: 30 mg/dL — ABNORMAL LOW (ref >39)
LDL Calculated: 56 mg/dL (ref 0–99)
Triglycerides: 111 mg/dL (ref 0–149)
VLDL Cholesterol Cal: 22 mg/dL (ref 5–40)

## 2013-10-31 LAB — VITAMIN D 25 HYDROXY (VIT D DEFICIENCY, FRACTURES): Vit D, 25-Hydroxy: 23.9 ng/mL — ABNORMAL LOW (ref 30.0–100.0)

## 2013-10-31 LAB — TSH: TSH: 3.16 u[IU]/mL (ref 0.450–4.500)

## 2013-10-31 LAB — MICROALBUMIN, URINE: Microalbumin, Urine: 2322.2 ug/mL — ABNORMAL HIGH (ref 0.0–17.0)

## 2013-11-07 ENCOUNTER — Ambulatory Visit (INDEPENDENT_AMBULATORY_CARE_PROVIDER_SITE_OTHER): Payer: Medicare Other | Admitting: Nurse Practitioner

## 2013-11-07 ENCOUNTER — Encounter: Payer: Self-pay | Admitting: Nurse Practitioner

## 2013-11-07 VITALS — BP 154/65 | HR 83 | Temp 98.4°F | Ht 69.0 in | Wt 240.0 lb

## 2013-11-07 DIAGNOSIS — J209 Acute bronchitis, unspecified: Secondary | ICD-10-CM

## 2013-11-07 MED ORDER — AZITHROMYCIN 250 MG PO TABS: ORAL_TABLET | ORAL | Status: DC

## 2013-11-07 NOTE — Progress Notes (Signed)
   Subjective:    Patient ID: Michael Trevino, male    DOB: [birth year], 72 y.o.   MRN: [id number]  HPI Patient in today c/o cough and congestion. Started 2 days ago- cough worsening- weak yesterday and could hardly get out of chair. Tried theraflu which has helped some.    Review of Systems  Constitutional: Positive for fatigue. Negative for fever, chills and appetite change.  HENT: Positive for congestion, rhinorrhea and sinus pressure. Negative for sore throat and trouble swallowing.   Respiratory: Positive for cough (nonproduative).   Cardiovascular: Negative.        Objective:   Physical Exam  Constitutional: He is oriented to person, place, and time. He appears well-developed and well-nourished.  HENT:  Right Ear: Hearing, tympanic membrane, external ear and ear canal normal.  Left Ear: Hearing, tympanic membrane, external ear and ear canal normal.  Nose: Mucosal edema and rhinorrhea present. Right sinus exhibits no maxillary sinus tenderness and no frontal sinus tenderness. Left sinus exhibits no maxillary sinus tenderness and no frontal sinus tenderness.  Mouth/Throat: Uvula is midline, oropharynx is clear and moist and mucous membranes are normal.  Cardiovascular: Normal rate and regular rhythm.   Murmur (2/6 systolic murmur) heard. Pulmonary/Chest: Effort normal and breath sounds normal.  Tight dry cough  Neurological: He is alert and oriented to person, place, and time.  Skin: Skin is warm.  Psychiatric: He has a normal mood and affect. His behavior is normal. Judgment and thought content normal.   BP 154/65  Pulse 83  Temp(Src) 98.4 F (36.9 C) (Oral)  Ht 5\' 9"  (1.753 m)  Wt 240 lb (108.863 kg)  BMI 35.43 kg/m2        Assessment & Plan:   1. Acute bronchitis    Meds ordered this encounter  Medications  . azithromycin (ZITHROMAX Z-PAK) 250 MG tablet    Sig: As directed    Dispense:  6 each    Refill:  0    Order Specific Question:  Supervising Provider   Answer:  Chipper Herb [[id number]]   1. Take meds as prescribed 2. Use a cool mist humidifier especially during the winter months and when heat has  been humid. 3. Use saline nose sprays frequently 4. Saline irrigations of the nose can be very helpful if done frequently.  * 4X daily for 1 week*  * Use of a nettie pot can be helpful with this. Follow directions with this* 5. Drink plenty of fluids 6. Keep thermostat turn down low 7.For any cough or congestion  Use plain Mucinex- regular strength or max strength is fine   * Children- consult with Pharmacist for dosing 8. For fever or aces or pains- take tylenol or ibuprofen appropriate for age and weight.  * for fevers greater than 101 orally you may alternate ibuprofen and tylenol every  3 hours. NO DECONGESTANTS!!!!!  Mary-Margaret Hassell Done, FNP

## 2013-11-07 NOTE — Patient Instructions (Signed)
1. Take meds as prescribed 2. Use a cool mist humidifier especially during the winter months and when heat has  been humid. 3. Use saline nose sprays frequently 4. Saline irrigations of the nose can be very helpful if done frequently.  * 4X daily for 1 week*  * Use of a nettie pot can be helpful with this. Follow directions with this* 5. Drink plenty of fluids 6. Keep thermostat turn down low 7.For any cough or congestion  Use plain Mucinex- regular strength or max strength is fine   * Children- consult with Pharmacist for dosing 8. For fever or aces or pains- take tylenol or ibuprofen appropriate for age and weight.  * for fevers greater than 101 orally you may alternate ibuprofen and tylenol every  3 hours.   NO DECONGESTANTS!!!

## 2013-11-25 ENCOUNTER — Other Ambulatory Visit: Payer: Self-pay | Admitting: Family Medicine

## 2013-12-09 ENCOUNTER — Encounter (INDEPENDENT_AMBULATORY_CARE_PROVIDER_SITE_OTHER): Payer: Self-pay

## 2013-12-09 ENCOUNTER — Encounter: Payer: Self-pay | Admitting: Pharmacist Clinician (PhC)/ Clinical Pharmacy Specialist

## 2013-12-09 ENCOUNTER — Ambulatory Visit (INDEPENDENT_AMBULATORY_CARE_PROVIDER_SITE_OTHER): Payer: Medicare Other | Admitting: Pharmacist Clinician (PhC)/ Clinical Pharmacy Specialist

## 2013-12-09 ENCOUNTER — Other Ambulatory Visit: Payer: Self-pay

## 2013-12-09 DIAGNOSIS — E785 Hyperlipidemia, unspecified: Secondary | ICD-10-CM

## 2013-12-09 MED ORDER — BENAZEPRIL HCL 40 MG PO TABS: 40.0000 mg | ORAL_TABLET | Freq: Every day | ORAL | Status: DC

## 2013-12-09 MED ORDER — LEVOTHYROXINE SODIUM 100 MCG PO TABS: 100.0000 ug | ORAL_TABLET | Freq: Every day | ORAL | Status: DC

## 2013-12-09 MED ORDER — AMLODIPINE BESYLATE 10 MG PO TABS: 10.0000 mg | ORAL_TABLET | Freq: Every day | ORAL | Status: DC

## 2013-12-09 MED ORDER — GLIMEPIRIDE 4 MG PO TABS: 4.0000 mg | ORAL_TABLET | Freq: Two times a day (BID) | ORAL | Status: DC

## 2013-12-09 MED ORDER — ATORVASTATIN CALCIUM 40 MG PO TABS: 40.0000 mg | ORAL_TABLET | Freq: Every day | ORAL | Status: DC

## 2013-12-09 NOTE — Progress Notes (Signed)
Patient comes in today concerned with the cost of crestor and zetia.  He is unable to afford their high costs.  Patient's labs reveal a low LDL-D of 56mg /dL and low total cholesterol of 108mg /dL.  He did not have a true allergy to simvastatin, muscle aches per patient.  I will stop crestor and zetia and try patient on atrovastatin 40mg  qd.  His home blood glucose readings were reviewed today and they are averaging around 120mg /dL.

## 2013-12-10 ENCOUNTER — Other Ambulatory Visit: Payer: Self-pay | Admitting: *Deleted

## 2013-12-10 MED ORDER — METOPROLOL TARTRATE 100 MG PO TABS: 100.0000 mg | ORAL_TABLET | Freq: Two times a day (BID) | ORAL | Status: DC

## 2013-12-11 ENCOUNTER — Ambulatory Visit: Payer: Self-pay

## 2013-12-23 ENCOUNTER — Other Ambulatory Visit: Payer: Self-pay | Admitting: Family Medicine

## 2013-12-23 DIAGNOSIS — Z1211 Encounter for screening for malignant neoplasm of colon: Secondary | ICD-10-CM

## 2013-12-26 ENCOUNTER — Telehealth: Payer: Self-pay | Admitting: Family Medicine

## 2013-12-27 NOTE — Telephone Encounter (Signed)
Call pharmacy. It is okay to refill the Rx even with the two different inks. Jody Silas P. Jacelyn Grip, M.D.

## 2013-12-29 ENCOUNTER — Other Ambulatory Visit: Payer: Self-pay

## 2013-12-29 DIAGNOSIS — E119 Type 2 diabetes mellitus without complications: Secondary | ICD-10-CM

## 2013-12-29 MED ORDER — LINAGLIPTIN 5 MG PO TABS: 5.0000 mg | ORAL_TABLET | Freq: Every day | ORAL | Status: DC

## 2013-12-29 NOTE — Telephone Encounter (Signed)
I don't understand the problem. This prescription went electronically so if the ink came out in two different colors it is a problem with their printer. In any case, I left the authorization to fill a 90 supply on the pharmacy voicemail. They requested 3 refills but the prescription was only written for 90 days with no refills.

## 2014-02-06 ENCOUNTER — Encounter: Payer: Self-pay | Admitting: Family Medicine

## 2014-02-06 ENCOUNTER — Ambulatory Visit (INDEPENDENT_AMBULATORY_CARE_PROVIDER_SITE_OTHER): Payer: Medicare Other | Admitting: Family Medicine

## 2014-02-06 VITALS — BP 132/59 | HR 66 | Temp 97.9°F | Ht 69.0 in | Wt 239.2 lb

## 2014-02-06 DIAGNOSIS — E1129 Type 2 diabetes mellitus with other diabetic kidney complication: Secondary | ICD-10-CM

## 2014-02-06 DIAGNOSIS — Z550 Illiteracy and low-level literacy: Secondary | ICD-10-CM

## 2014-02-06 DIAGNOSIS — E785 Hyperlipidemia, unspecified: Secondary | ICD-10-CM

## 2014-02-06 DIAGNOSIS — I10 Essential (primary) hypertension: Secondary | ICD-10-CM

## 2014-02-06 DIAGNOSIS — N189 Chronic kidney disease, unspecified: Secondary | ICD-10-CM

## 2014-02-06 DIAGNOSIS — E559 Vitamin D deficiency, unspecified: Secondary | ICD-10-CM

## 2014-02-06 DIAGNOSIS — E039 Hypothyroidism, unspecified: Secondary | ICD-10-CM

## 2014-02-06 DIAGNOSIS — E1165 Type 2 diabetes mellitus with hyperglycemia: Secondary | ICD-10-CM

## 2014-02-06 DIAGNOSIS — M1990 Unspecified osteoarthritis, unspecified site: Secondary | ICD-10-CM

## 2014-02-06 DIAGNOSIS — IMO0002 Reserved for concepts with insufficient information to code with codable children: Secondary | ICD-10-CM

## 2014-02-06 DIAGNOSIS — N529 Male erectile dysfunction, unspecified: Secondary | ICD-10-CM

## 2014-02-06 DIAGNOSIS — M129 Arthropathy, unspecified: Secondary | ICD-10-CM

## 2014-02-06 LAB — POCT GLYCOSYLATED HEMOGLOBIN (HGB A1C): Hemoglobin A1C: 8.3

## 2014-02-06 NOTE — Progress Notes (Signed)
Patient ID: Michael Trevino, male   DOB: September 24, 1941, 73 y.o.   MRN: [id number] SUBJECTIVE: CC: Chief Complaint  Patient presents with  . Follow-up    3 month follow up no complaints    HPI: Patient is here for follow up of Diabetes Mellitus/HLD/HTN: Symptoms evaluated: Denies Nocturia ,Denies Urinary Frequency , denies Blurred vision ,deniesDizziness,denies.Dysuria,denies paresthesias, denies extremity pain or ulcers.Marland Kitchendenies chest pain. has had an annual eye exam. do check the feet. Does check CBGs. Average CBG:150 Denies episodes of hypoglycemia. Does have an emergency hypoglycemic plan. admits toCompliance with medications. Denies Problems with medications.  Past Medical History  Diagnosis Date  . Hyperlipidemia   . Hypertension   . MI (myocardial infarction) 2000  . Diabetes mellitus without complication   . Arthritis   . Sepsis   . Thyroid disease     hypothyroid   Past Surgical History  Procedure Laterality Date  . Tee without cardioversion N/A 02/26/2013    Procedure: TRANSESOPHAGEAL ECHOCARDIOGRAM (TEE);  Surgeon: Thayer Headings, MD;  Location: [facility] ENDOSCOPY;  Service: Cardiovascular;  Laterality: N/A;   History   Social History  . Marital Status: Married    Spouse Name: N/A    Number of Children: N/A  . Years of Education: N/A   Occupational History  . Not on file.   Social History Main Topics  . Smoking status: Former Smoker    Types: Cigarettes    Quit date: 11/05/1984  . Smokeless tobacco: Never Used  . Alcohol Use: No  . Drug Use: No  . Sexual Activity: Not on file   Other Topics Concern  . Not on file   Social History Narrative  . No narrative on file   Family History  Problem Relation Age of Onset  . Diabetes Mother   . Cancer Father   . Stroke Brother   . Diabetes Brother    Current Outpatient Prescriptions on File Prior to Visit  Medication Sig Dispense Refill  . amLODipine (NORVASC) 10 MG tablet Take 1 tablet (10 mg total) by mouth  daily.  90 tablet  0  . ascorbic acid (VITAMIN C) 500 MG tablet Take 500 mg by mouth daily.      Marland Kitchen aspirin 325 MG EC tablet Take 325 mg by mouth daily.      Marland Kitchen atorvastatin (LIPITOR) 40 MG tablet Take 1 tablet (40 mg total) by mouth daily.  90 tablet  3  . benazepril (LOTENSIN) 40 MG tablet Take 1 tablet (40 mg total) by mouth daily.  90 tablet  0  . furosemide (LASIX) 40 MG tablet Take 40 mg by mouth 2 (two) times daily.        Marland Kitchen glimepiride (AMARYL) 4 MG tablet Take 1 tablet (4 mg total) by mouth 2 (two) times daily.  180 tablet  0  . Insulin Detemir (LEVEMIR FLEXPEN) 100 UNIT/ML SOPN Inject 47 Units into the skin daily.  45 mL  2  . levothyroxine (SYNTHROID, LEVOTHROID) 100 MCG tablet Take 1 tablet (100 mcg total) by mouth daily before breakfast.  90 tablet  3  . linagliptin (TRADJENTA) 5 MG TABS tablet Take 1 tablet (5 mg total) by mouth daily.  90 tablet  0  . metoprolol (LOPRESSOR) 100 MG tablet Take 1 tablet (100 mg total) by mouth 2 (two) times daily.  180 tablet  1  . pioglitazone (ACTOS) 30 MG tablet Take 30 mg by mouth daily.         No current facility-administered medications on  file prior to visit.   Allergies  Allergen Reactions  . Zocor [Simvastatin - High Dose]     Unknown   Immunization History  Administered Date(s) Administered  . Influenza Whole 08/25/2008  . Pneumococcal Polysaccharide-23 10/06/2005, 12/12/2007   Prior to Admission medications   Medication Sig Start Date End Date Taking? Authorizing Provider  amLODipine (NORVASC) 10 MG tablet Take 1 tablet (10 mg total) by mouth daily. 12/09/13  Yes Chipper Herb, MD  ascorbic acid (VITAMIN C) 500 MG tablet Take 500 mg by mouth daily.   Yes Historical Provider, MD  aspirin 325 MG EC tablet Take 325 mg by mouth daily.   Yes Historical Provider, MD  atorvastatin (LIPITOR) 40 MG tablet Take 1 tablet (40 mg total) by mouth daily. 12/09/13  Yes Michelle Bozovich, RPH  benazepril (LOTENSIN) 40 MG tablet Take 1 tablet (40  mg total) by mouth daily. 12/09/13  Yes Chipper Herb, MD  furosemide (LASIX) 40 MG tablet Take 40 mg by mouth 2 (two) times daily.     Yes Historical Provider, MD  glimepiride (AMARYL) 4 MG tablet Take 1 tablet (4 mg total) by mouth 2 (two) times daily. 12/09/13  Yes Chipper Herb, MD  Insulin Detemir (LEVEMIR FLEXPEN) 100 UNIT/ML SOPN Inject 47 Units into the skin daily. 07/17/13  Yes Vernie Shanks, MD  levothyroxine (SYNTHROID, LEVOTHROID) 100 MCG tablet Take 1 tablet (100 mcg total) by mouth daily before breakfast. 12/09/13  Yes Chipper Herb, MD  linagliptin (TRADJENTA) 5 MG TABS tablet Take 1 tablet (5 mg total) by mouth daily. 12/29/13  Yes Chipper Herb, MD  metoprolol (LOPRESSOR) 100 MG tablet Take 1 tablet (100 mg total) by mouth 2 (two) times daily. 12/10/13  Yes Vernie Shanks, MD  pioglitazone (ACTOS) 30 MG tablet Take 30 mg by mouth daily.     Yes Historical Provider, MD     ROS: As above in the HPI. All other systems are stable or negative.  OBJECTIVE: APPEARANCE:  Patient in no acute distress.The patient appeared well nourished and normally developed. Acyanotic. Waist: VITAL SIGNS:BP 132/59  Pulse 66  Temp(Src) 97.9 F (36.6 C) (Oral)  Ht _0  (1.753 m)  Wt 239 lb 3.2 oz (108.5 kg)  BMI 35.31 kg/m2 Obese AAM  SKIN: warm and  Dry without overt rashes, tattoos and scars  HEAD and Neck: without JVD, Head and scalp: normal Eyes:No scleral icterus. Fundi normal, eye movements normal. Ears: Auricle normal, canal normal, Tympanic membranes normal, insufflation normal. Nose: normal Throat: normal Neck & thyroid: normal  CHEST & LUNGS: Chest wall: normal Lungs: Clear  CVS: Reveals the PMI to be normally located. Regular rhythm, First and Second Heart sounds are normal,  absence of murmurs, rubs or gallops. Peripheral vasculature: Radial pulses: normal Dorsal pedis pulses: normal Posterior pulses: normal  ABDOMEN:  Appearance: obese Benign, no organomegaly,  no masses, no Abdominal Aortic enlargement. No Guarding , no rebound. No Bruits. Bowel sounds: normal  RECTAL: N/A GU: N/A  EXTREMETIES: nonedematous.  MUSCULOSKELETAL:  Spine: normal Joints: intact  NEUROLOGIC: oriented to time,place and person; nonfocal. Strength is normal Sensory is normal Reflexes are normal Cranial Nerves are normal.  ASSESSMENT:  HLD (hyperlipidemia) - Plan: CMP14+EGFR, Lipid panel  DM (diabetes mellitus), type 2, uncontrolled, with renal complications - Plan: POCT glycosylated hemoglobin (Hb A1C), CMP14+EGFR  CKD (chronic kidney disease)  HTN (hypertension)  Illiterate  Hypothyroid - Plan: TSH  Erectile dysfunction  Arthritis  Unspecified vitamin D  deficiency - Plan: Vit D  25 hydroxy (rtn osteoporosis monitoring)    PLAN:  Discussed about obesity and metabolic syndrome.  He is to have a literate family member read the information sent in the AVS and explain to hom. We discussed diet and exercise and LTC for 30 minutes      Dr Paula Libra Recommendations  For nutrition information, I recommend books:  1).Eat to Live by Dr Excell Seltzer. 2).Prevent and Reverse Heart Disease by Dr Karl Luke. 3) Dr Janene Harvey Book:  Program to Reverse Diabetes  Exercise recommendations are:  If unable to walk, then the patient can exercise in a chair 3 times a day. By flapping arms like a bird gently and raising legs outwards to the front.  If ambulatory, the patient can go for walks for 30 minutes 3 times a week. Then increase the intensity and duration as tolerated.  Goal is to try to attain exercise frequency to 5 times a week.  If applicable: Best to perform resistance exercises (machines or weights) 2 days a week and cardio type exercises 3 days per week.   TAKE: VITAMIN D 1000 units daily   Orders Placed This Encounter  Procedures  . CMP14+EGFR  . Lipid panel  . TSH  . Vit D  25 hydroxy (rtn osteoporosis monitoring)   . POCT glycosylated hemoglobin (Hb A1C)   No orders of the defined types were placed in this encounter.   Medications Discontinued During This Encounter  Medication Reason  . azithromycin (ZITHROMAX Z-PAK) 250 MG tablet Completed Course   Return in about 3 months (around 05/09/2014) for Recheck medical problems.  Reyanne Hussar P. Jacelyn Grip, M.D.

## 2014-02-06 NOTE — Patient Instructions (Addendum)
Metabolic Syndrome, Adult Metabolic syndrome descibes a group of risk factors for heart disease and diabetes. This syndrome has other names including Insulin Resistance Syndrome. The more risk factors you have, the higher your risk of having a heart attack, stroke, or developing diabetes. These risk factors include:  High blood sugar.  High blood triglyceride (a fat found in the blood) level.  High blood pressure.  Abdominal obesity (your extra weight is around your waist instead of your hips).  Low levels of high-density lipoprotein, HDL (good blood cholesterol). If you have any three of these risk factors, you have metabolic syndrome. If you have even one of these factors, you should make lifestyle changes to improve your health in order to prevent serious health diseases.  In people with metabolic syndrome, the cells do not respond properly to insulin. This can lead to high levels of glucose in the blood, which can interfere with normal body processes. Eventually, this can cause high blood pressure and higher fat levels in the blood, and inflammation of your blood vessels. The result can be heart disease and stroke.  CAUSES   Eating a diet rich in calories and saturated fat.  Too little physical activity.  Being overweight. Other underlying causes are:  Family history (genetics).  Ethnicity (South Asians are at a higher risk).  Older age (your chances of developing metabolic syndrome are higher as you grow older).  Insulin resistance. SYMPTOMS  By itself, metabolic syndrome has no symptoms. However, you might have symptoms of diabetes (high blood sugar) or high blood pressure, such as:  Increased thirst, urination, and tiredness.  Dizzy spells.  Dull headaches that are unusual for you.  Blurred vision.  Nosebleeds. DIAGNOSIS  Your caregiver may make a diagnosis of metabolic syndrome if you have at least three of these factors:  If you are overweight mostly around the  waist. This means a waistline greater than 40" in men and more than 35" in women. The waistline limits are 31 to 35 inches for women and 37 to 39 inches for men. In those who have certain genetic risk factors, such as having a family history of diabetes or being of Asian descent.  If you have a blood pressure of 130/85 mm Hg or more, or if you are being treated for high blood pressure.  If your blood triglyceride level is 150 mg/dL or more, or you are being treated for high levels of triglyceride.  If the level of HDL in your blood is below 40 mg/dL in men, less than 50 mg/dL in women, or you are receiving treatment for low levels of HDL.  If the level of sugar in your blood is high with fasting blood sugar level of 110 mg/dL or more, or you are under treatment for diabetes. TREATMENT  Your caregiver may have you make lifestyle changes, which may include:  Exercise.  Losing weight.  Maintaining a healthy diet.  Quitting smoking. The lifestyle changes listed above are key in reducing your risk for heart disease and stroke. Medicines may also be prescribed to help your body respond to insulin better and to reduce your blood pressure and blood fat levels. Aspirin may be recommended to reduce risks of heart disease or stroke.  HOME CARE INSTRUCTIONS   Exercise.  Measure your waist at regular intervals just above the hipbones after you have breathed out.  Maintain a healthy diet.  Eat fruits, such as apples, oranges, and pears.  Eat vegetables.  Eat legumes, such as kidney  beans, peas, and lentils.  Eat food rich in soluble fiber, such as whole grain cereal, oatmeal, and oat bran.  Use olive or safflower oils and avoid saturated fats.  Eat nuts.  Limit the amount of salt you eat or add to food.  Limit the amount of alcohol you drink.  Include fish in your diet, if possible.  Stop smoking if you are a smoker.  Maintain regular follow-up appointments.  Follow your  caregiver's advice. SEEK MEDICAL CARE IF:   You feel very tired or fatigued.  You develop excessive thirst.  You pass large quantities of urine.  You are putting on weight around your waist rather than losing weight.  You develop headaches over and over again.  You have off-and-on dizzy spells. SEEK IMMEDIATE MEDICAL CARE IF:   You develop nosebleeds.  You develop sudden blurred vision.  You develop sudden dizzy spells.  You develop chest pains, trouble breathing, or feel an abnormal or irregular heart beat.  You have a fainting episode.  You develop any sudden trouble speaking and/or swallowing.  You develop sudden weakness in one arm and/or one leg. MAKE SURE YOU:   Understand these instructions.  Will watch your condition.  Will get help right away if you are not doing well or get worse. Document Released: 02/06/2008 Document Revised: 01/22/2012 Document Reviewed: 02/06/2008 [facility] Patient Information 2014 [place], [place].   Diabetes and Foot Care Diabetes may cause you to have problems because of poor blood supply (circulation) to your feet and legs. This may cause the skin on your feet to become thinner, break easier, and heal more slowly. Your skin may become dry, and the skin may peel and crack. You may also have nerve damage in your legs and feet causing decreased feeling in them. You may not notice minor injuries to your feet that could lead to infections or more serious problems. Taking care of your feet is one of the most important things you can do for yourself.  HOME CARE INSTRUCTIONS  Wear shoes at all times, even in the house. Do not go barefoot. Bare feet are easily injured.  Check your feet daily for blisters, cuts, and redness. If you cannot see the bottom of your feet, use a mirror or ask someone for help.  Wash your feet with warm water (do not use hot water) and mild soap. Then pat your feet and the areas between your toes until they are  completely dry. Do not soak your feet as this can dry your skin.  Apply a moisturizing lotion or petroleum jelly (that does not contain alcohol and is unscented) to the skin on your feet and to dry, brittle toenails. Do not apply lotion between your toes.  Trim your toenails straight across. Do not dig under them or around the cuticle. File the edges of your nails with an emery board or nail file.  Do not cut corns or calluses or try to remove them with medicine.  Wear clean socks or stockings every day. Make sure they are not too tight. Do not wear knee-high stockings since they may decrease blood flow to your legs.  Wear shoes that fit properly and have enough cushioning. To break in new shoes, wear them for just a few hours a day. This prevents you from injuring your feet. Always look in your shoes before you put them on to be sure there are no objects inside.  Do not cross your legs. This may decrease the blood flow to  your feet.  If you find a minor scrape, cut, or break in the skin on your feet, keep it and the skin around it clean and dry. These areas may be cleansed with mild soap and water. Do not cleanse the area with peroxide, alcohol, or iodine.  When you remove an adhesive bandage, be sure not to damage the skin around it.  If you have a wound, look at it several times a day to make sure it is healing.  Do not use heating pads or hot water bottles. They may burn your skin. If you have lost feeling in your feet or legs, you may not know it is happening until it is too late.  Make sure your health care provider performs a complete foot exam at least annually or more often if you have foot problems. Report any cuts, sores, or bruises to your health care provider immediately. SEEK MEDICAL CARE IF:   You have an injury that is not healing.  You have cuts or breaks in the skin.  You have an ingrown nail.  You notice redness on your legs or feet.  You feel burning or tingling  in your legs or feet.  You have pain or cramps in your legs and feet.  Your legs or feet are numb.  Your feet always feel cold. SEEK IMMEDIATE MEDICAL CARE IF:   There is increasing redness, swelling, or pain in or around a wound.  There is a red line that goes up your leg.  Pus is coming from a wound.  You develop a fever or as directed by your health care provider.  You notice a bad smell coming from an ulcer or wound. Document Released: 10/27/2000 Document Revised: 07/02/2013 Document Reviewed: 04/08/2013 [facility] Patient Information 2014 [place].   Obesity Obesity is defined as having too much total body fat and a body mass index (BMI) of 30 or more. BMI is an estimate of body fat and is calculated from your height and weight. Obesity happens when you consume more calories than you can burn by exercising or performing daily physical tasks. Prolonged obesity can cause major illnesses or emergencies, such as:   A stroke.  Heart disease.  Diabetes.  Cancer.  Arthritis.  High blood pressure (hypertension).  High cholesterol.  Sleep apnea.  Erectile dysfunction.  Infertility problems. CAUSES   Regularly eating unhealthy foods.  Physical inactivity.  Certain disorders, such as an underactive thyroid (hypothyroidism), Cushing's syndrome, and polycystic ovarian syndrome.  Certain medicines, such as steroids, some depression medicines, and antipsychotics.  Genetics.  Lack of sleep. DIAGNOSIS  A caregiver can diagnose obesity after calculating your BMI. Obesity will be diagnosed if your BMI is 30 or higher.  There are other methods of measuring obesity levels. Some other methods include measuring your skin fold thickness, your waist circumference, and comparing your hip circumference to your waist circumference. TREATMENT  A healthy treatment program includes some or all of the following:  Long-term dietary changes.  Exercise and physical  activity.  Behavioral and lifestyle changes.  Medicine only under the supervision of your caregiver. Medicines may help, but only if they are used with diet and exercise programs. An unhealthy treatment program includes:  Fasting.  Fad diets.  Supplements and drugs. These choices do not succeed in long-term weight control.  HOME CARE INSTRUCTIONS   Exercise and perform physical activity as directed by your caregiver. To increase physical activity, try the following:  Use stairs instead of elevators.  Park farther away from store entrances.  Garden, bike, or walk instead of watching television or using the computer.  Eat healthy, low-calorie foods and drinks on a regular basis. Eat more fruits and vegetables. Use low-calorie cookbooks or take healthy cooking classes.  Limit fast food, sweets, and processed snack foods.  Eat smaller portions.  Keep a daily journal of everything you eat. There are many free websites to help you with this. It may be helpful to measure your foods so you can determine if you are eating the correct portion sizes.  Avoid drinking alcohol. Drink more water and drinks without calories.  Take vitamins and supplements only as recommended by your caregiver.  Weight-loss support groups, Nurse, mental health, counselors, and stress reduction education can also be very helpful. SEEK IMMEDIATE MEDICAL CARE IF:  You have chest pain or tightness.  You have trouble breathing or feel short of breath.  You have weakness or leg numbness.  You feel confused or have trouble talking.  You have sudden changes in your vision. MAKE SURE YOU:  Understand these instructions.  Will watch your condition.  Will get help right away if you are not doing well or get worse. Document Released: 12/07/2004 Document Revised: 04/30/2012 Document Reviewed: 12/06/2011 [facility] Patient Information 2014 [place].        Dr Paula Libra Recommendations  For  nutrition information, I recommend books:  1).Eat to Live by Dr Excell Seltzer. 2).Prevent and Reverse Heart Disease by Dr Karl Luke. 3) Dr Janene Harvey Book:  Program to Reverse Diabetes  Exercise recommendations are:  If unable to walk, then the patient can exercise in a chair 3 times a day. By flapping arms like a bird gently and raising legs outwards to the front.  If ambulatory, the patient can go for walks for 30 minutes 3 times a week. Then increase the intensity and duration as tolerated.  Goal is to try to attain exercise frequency to 5 times a week.  If applicable: Best to perform resistance exercises (machines or weights) 2 days a week and cardio type exercises 3 days per week.   TAKE: VITAMIN D 1000 units daily

## 2014-02-07 LAB — CMP14+EGFR
ALT: 17 IU/L (ref 0–44)
AST: 18 IU/L (ref 0–40)
Albumin/Globulin Ratio: 1.4 (ref 1.1–2.5)
Albumin: 3.9 g/dL (ref 3.5–4.8)
Alkaline Phosphatase: 86 IU/L (ref 39–117)
BUN/Creatinine Ratio: 10 (ref 10–22)
BUN: 16 mg/dL (ref 8–27)
CO2: 24 mmol/L (ref 18–29)
Calcium: 8.6 mg/dL (ref 8.6–10.2)
Chloride: 104 mmol/L (ref 97–108)
Creatinine, Ser: 1.6 mg/dL — ABNORMAL HIGH (ref 0.76–1.27)
GFR calc Af Amer: 49 mL/min/{1.73_m2} — ABNORMAL LOW (ref >59)
GFR calc non Af Amer: 42 mL/min/{1.73_m2} — ABNORMAL LOW (ref >59)
Globulin, Total: 2.7 g/dL (ref 1.5–4.5)
Glucose: 102 mg/dL — ABNORMAL HIGH (ref 65–99)
Potassium: 4.1 mmol/L (ref 3.5–5.2)
Sodium: 140 mmol/L (ref 134–144)
Total Bilirubin: 0.4 mg/dL (ref 0.0–1.2)
Total Protein: 6.6 g/dL (ref 6.0–8.5)

## 2014-02-07 LAB — TSH: TSH: 3.15 u[IU]/mL (ref 0.450–4.500)

## 2014-02-07 LAB — LIPID PANEL
Chol/HDL Ratio: 4.5 ratio units (ref 0.0–5.0)
Cholesterol, Total: 136 mg/dL (ref 100–199)
HDL: 30 mg/dL — ABNORMAL LOW (ref >39)
LDL Calculated: 83 mg/dL (ref 0–99)
Triglycerides: 116 mg/dL (ref 0–149)
VLDL Cholesterol Cal: 23 mg/dL (ref 5–40)

## 2014-02-07 LAB — VITAMIN D 25 HYDROXY (VIT D DEFICIENCY, FRACTURES): Vit D, 25-Hydroxy: 18.1 ng/mL — ABNORMAL LOW (ref 30.0–100.0)

## 2014-03-01 ENCOUNTER — Emergency Department (HOSPITAL_COMMUNITY)
Admission: EM | Admit: 2014-03-01 | Discharge: 2014-03-01 | Disposition: A | Discharge status: Home / Self Care | Payer: No Typology Code available for payment source | Attending: Emergency Medicine | Admitting: Emergency Medicine

## 2014-03-01 ENCOUNTER — Encounter (HOSPITAL_COMMUNITY): Payer: Self-pay | Admitting: Emergency Medicine

## 2014-03-01 DIAGNOSIS — S20219A Contusion of unspecified front wall of thorax, initial encounter: Secondary | ICD-10-CM

## 2014-03-01 DIAGNOSIS — E119 Type 2 diabetes mellitus without complications: Secondary | ICD-10-CM | POA: Insufficient documentation

## 2014-03-01 DIAGNOSIS — E785 Hyperlipidemia, unspecified: Secondary | ICD-10-CM | POA: Insufficient documentation

## 2014-03-01 DIAGNOSIS — S8000XA Contusion of unspecified knee, initial encounter: Secondary | ICD-10-CM | POA: Insufficient documentation

## 2014-03-01 DIAGNOSIS — Y92410 Unspecified street and highway as the place of occurrence of the external cause: Secondary | ICD-10-CM | POA: Insufficient documentation

## 2014-03-01 DIAGNOSIS — Y9389 Activity, other specified: Secondary | ICD-10-CM | POA: Insufficient documentation

## 2014-03-01 DIAGNOSIS — Z87448 Personal history of other diseases of urinary system: Secondary | ICD-10-CM | POA: Insufficient documentation

## 2014-03-01 DIAGNOSIS — Z79899 Other long term (current) drug therapy: Secondary | ICD-10-CM | POA: Insufficient documentation

## 2014-03-01 DIAGNOSIS — I10 Essential (primary) hypertension: Secondary | ICD-10-CM | POA: Insufficient documentation

## 2014-03-01 DIAGNOSIS — E079 Disorder of thyroid, unspecified: Secondary | ICD-10-CM | POA: Insufficient documentation

## 2014-03-01 DIAGNOSIS — IMO0002 Reserved for concepts with insufficient information to code with codable children: Secondary | ICD-10-CM | POA: Insufficient documentation

## 2014-03-01 DIAGNOSIS — M129 Arthropathy, unspecified: Secondary | ICD-10-CM | POA: Insufficient documentation

## 2014-03-01 DIAGNOSIS — Z794 Long term (current) use of insulin: Secondary | ICD-10-CM | POA: Insufficient documentation

## 2014-03-01 DIAGNOSIS — S80219A Abrasion, unspecified knee, initial encounter: Secondary | ICD-10-CM

## 2014-03-01 DIAGNOSIS — Z8619 Personal history of other infectious and parasitic diseases: Secondary | ICD-10-CM | POA: Insufficient documentation

## 2014-03-01 DIAGNOSIS — Z87891 Personal history of nicotine dependence: Secondary | ICD-10-CM | POA: Insufficient documentation

## 2014-03-01 DIAGNOSIS — I252 Old myocardial infarction: Secondary | ICD-10-CM | POA: Insufficient documentation

## 2014-03-01 DIAGNOSIS — Z7982 Long term (current) use of aspirin: Secondary | ICD-10-CM | POA: Insufficient documentation

## 2014-03-01 HISTORY — DX: Disorder of kidney and ureter, unspecified: N28.9

## 2014-03-01 LAB — CBG MONITORING, ED: Glucose-Capillary: 153 mg/dL — ABNORMAL HIGH (ref 70–99)

## 2014-03-01 MED ORDER — IBUPROFEN 400 MG PO TABS
400.0000 mg | ORAL_TABLET | Freq: Once | ORAL | Status: AC
  Administered 2014-03-01: 400 mg via ORAL
  Filled 2014-03-01: qty 1
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
## 2014-03-03 ENCOUNTER — Telehealth: Payer: Self-pay | Admitting: Family Medicine

## 2014-03-04 NOTE — Telephone Encounter (Signed)
appt given for 4/27 with Jacelyn Grip

## 2014-03-09 ENCOUNTER — Ambulatory Visit (INDEPENDENT_AMBULATORY_CARE_PROVIDER_SITE_OTHER): Payer: Medicare Other

## 2014-03-09 ENCOUNTER — Other Ambulatory Visit: Payer: Self-pay | Admitting: Family Medicine

## 2014-03-09 ENCOUNTER — Ambulatory Visit (INDEPENDENT_AMBULATORY_CARE_PROVIDER_SITE_OTHER): Payer: Medicare Other | Admitting: Family Medicine

## 2014-03-09 ENCOUNTER — Encounter: Payer: Self-pay | Admitting: Family Medicine

## 2014-03-09 VITALS — BP 143/56 | HR 58 | Temp 97.8°F | Ht 69.0 in | Wt 245.4 lb

## 2014-03-09 DIAGNOSIS — T148XXA Other injury of unspecified body region, initial encounter: Secondary | ICD-10-CM

## 2014-03-09 DIAGNOSIS — M25551 Pain in right hip: Secondary | ICD-10-CM

## 2014-03-09 DIAGNOSIS — M25561 Pain in right knee: Secondary | ICD-10-CM

## 2014-03-09 DIAGNOSIS — M25559 Pain in unspecified hip: Secondary | ICD-10-CM

## 2014-03-09 DIAGNOSIS — L089 Local infection of the skin and subcutaneous tissue, unspecified: Secondary | ICD-10-CM | POA: Insufficient documentation

## 2014-03-09 DIAGNOSIS — S81011A Laceration without foreign body, right knee, initial encounter: Secondary | ICD-10-CM

## 2014-03-09 DIAGNOSIS — M25569 Pain in unspecified knee: Secondary | ICD-10-CM

## 2014-03-09 DIAGNOSIS — S8000XA Contusion of unspecified knee, initial encounter: Secondary | ICD-10-CM

## 2014-03-09 DIAGNOSIS — S81009A Unspecified open wound, unspecified knee, initial encounter: Secondary | ICD-10-CM

## 2014-03-09 DIAGNOSIS — S91009A Unspecified open wound, unspecified ankle, initial encounter: Secondary | ICD-10-CM

## 2014-03-09 DIAGNOSIS — S81809A Unspecified open wound, unspecified lower leg, initial encounter: Secondary | ICD-10-CM

## 2014-03-09 DIAGNOSIS — S8001XA Contusion of right knee, initial encounter: Secondary | ICD-10-CM

## 2014-03-09 MED ORDER — MUPIROCIN 2 % EX OINT: 1.0000 "application " | TOPICAL_OINTMENT | Freq: Two times a day (BID) | CUTANEOUS | Status: AC

## 2014-03-09 MED ORDER — DOXYCYCLINE HYCLATE 100 MG PO TABS: 100.0000 mg | ORAL_TABLET | Freq: Two times a day (BID) | ORAL | Status: DC
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
## 2014-03-16 ENCOUNTER — Encounter: Payer: Self-pay | Admitting: Family

## 2014-03-16 ENCOUNTER — Ambulatory Visit (INDEPENDENT_AMBULATORY_CARE_PROVIDER_SITE_OTHER): Payer: Medicare Other | Admitting: Family

## 2014-03-16 VITALS — BP 148/60 | HR 75 | Temp 98.8°F | Ht 69.0 in | Wt 245.8 lb

## 2014-03-16 DIAGNOSIS — S99919A Unspecified injury of unspecified ankle, initial encounter: Secondary | ICD-10-CM

## 2014-03-16 DIAGNOSIS — S8991XA Unspecified injury of right lower leg, initial encounter: Secondary | ICD-10-CM

## 2014-03-16 DIAGNOSIS — S8990XA Unspecified injury of unspecified lower leg, initial encounter: Secondary | ICD-10-CM

## 2014-03-16 DIAGNOSIS — S99929A Unspecified injury of unspecified foot, initial encounter: Secondary | ICD-10-CM

## 2014-03-16 NOTE — Patient Instructions (Signed)
Wound Check Your wound appears healthy today. Your wound will heal gradually over time. Eventually a scar will form that will fade with time. FACTORS THAT AFFECT SCAR FORMATION:  People differ in the severity in which they scar.  Scar severity varies according to location, size, and the traits you inherited from your parents (genetic predisposition).  Irritation to the wound from infection, rubbing, or chemical exposure will increase the amount of scar formation. HOME CARE INSTRUCTIONS   If you were given a dressing, you should change it at least once a day or as instructed by your caregiver. If the bandage sticks, soak it off with a solution of hydrogen peroxide.  If the bandage becomes wet, dirty, or develops a bad smell, change it as soon as possible.  Look for signs of infection.  Only take over-the-counter or prescription medicines for pain, discomfort, or fever as directed by your caregiver. SEEK IMMEDIATE MEDICAL CARE IF:   You have redness, swelling, or increasing pain in the wound.  You notice pus coming from the wound.  You have a fever.  You notice a bad smell coming from the wound or dressing. Document Released: 08/05/2004 Document Revised: 01/22/2012 Document Reviewed: 10/30/2005 [facility] Patient Information 2014 [place].   Check blood pressure at home daily. Daily log given, bring to next appointment.
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
## 2014-04-07 ENCOUNTER — Other Ambulatory Visit: Payer: Self-pay | Admitting: *Deleted

## 2014-04-07 DIAGNOSIS — E119 Type 2 diabetes mellitus without complications: Secondary | ICD-10-CM

## 2014-04-07 MED ORDER — LINAGLIPTIN 5 MG PO TABS: 5.0000 mg | ORAL_TABLET | Freq: Every day | ORAL | Status: DC

## 2014-05-26 ENCOUNTER — Other Ambulatory Visit: Payer: Self-pay | Admitting: *Deleted

## 2014-05-26 MED ORDER — INSULIN PEN NEEDLE 31G X 5 MM MISC: Status: DC

## 2014-05-26 NOTE — Telephone Encounter (Signed)
Last Ov 3/15 with Jacelyn Grip. Last AIC 8.3 3/15.

## 2014-06-03 ENCOUNTER — Encounter: Payer: Self-pay | Admitting: Family Medicine

## 2014-06-03 ENCOUNTER — Ambulatory Visit (INDEPENDENT_AMBULATORY_CARE_PROVIDER_SITE_OTHER): Payer: Medicare Other | Admitting: Family Medicine

## 2014-06-03 VITALS — BP 144/56 | HR 58 | Temp 97.4°F | Ht 69.0 in | Wt 243.0 lb

## 2014-06-03 DIAGNOSIS — E1165 Type 2 diabetes mellitus with hyperglycemia: Principal | ICD-10-CM

## 2014-06-03 DIAGNOSIS — IMO0002 Reserved for concepts with insufficient information to code with codable children: Secondary | ICD-10-CM

## 2014-06-03 DIAGNOSIS — E1129 Type 2 diabetes mellitus with other diabetic kidney complication: Secondary | ICD-10-CM

## 2014-06-03 LAB — POCT GLYCOSYLATED HEMOGLOBIN (HGB A1C): Hemoglobin A1C: 7.3

## 2014-06-03 MED ORDER — LEVOTHYROXINE SODIUM 100 MCG PO TABS: 100.0000 ug | ORAL_TABLET | Freq: Every day | ORAL | Status: DC

## 2014-06-03 MED ORDER — GLIMEPIRIDE 4 MG PO TABS: 4.0000 mg | ORAL_TABLET | Freq: Two times a day (BID) | ORAL | Status: DC

## 2014-06-03 MED ORDER — METOPROLOL TARTRATE 100 MG PO TABS: 100.0000 mg | ORAL_TABLET | Freq: Two times a day (BID) | ORAL | Status: DC

## 2014-06-03 MED ORDER — LINAGLIPTIN 5 MG PO TABS: 5.0000 mg | ORAL_TABLET | Freq: Every day | ORAL | Status: DC

## 2014-06-03 MED ORDER — AMLODIPINE BESYLATE 10 MG PO TABS: 10.0000 mg | ORAL_TABLET | Freq: Every day | ORAL | Status: DC
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
## 2014-06-03 NOTE — Patient Instructions (Signed)
Schedule eye exam

## 2014-06-04 LAB — BMP8+EGFR
BUN/Creatinine Ratio: 9 — ABNORMAL LOW (ref 10–22)
BUN: 15 mg/dL (ref 8–27)
CO2: 24 mmol/L (ref 18–29)
Calcium: 8.6 mg/dL (ref 8.6–10.2)
Chloride: 106 mmol/L (ref 97–108)
Creatinine, Ser: 1.61 mg/dL — ABNORMAL HIGH (ref 0.76–1.27)
GFR calc Af Amer: 48 mL/min/{1.73_m2} — ABNORMAL LOW (ref >59)
GFR calc non Af Amer: 42 mL/min/{1.73_m2} — ABNORMAL LOW (ref >59)
Glucose: 86 mg/dL (ref 65–99)
Potassium: 4.4 mmol/L (ref 3.5–5.2)
Sodium: 142 mmol/L (ref 134–144)

## 2014-06-09 ENCOUNTER — Other Ambulatory Visit: Payer: Self-pay | Admitting: Family Medicine

## 2014-06-11 ENCOUNTER — Other Ambulatory Visit: Payer: Self-pay | Admitting: Nurse Practitioner

## 2014-06-12 ENCOUNTER — Other Ambulatory Visit: Payer: Self-pay | Admitting: *Deleted

## 2014-06-12 MED ORDER — GLIMEPIRIDE 4 MG PO TABS: 4.0000 mg | ORAL_TABLET | Freq: Two times a day (BID) | ORAL | Status: DC

## 2014-06-12 MED ORDER — METOPROLOL TARTRATE 100 MG PO TABS: 100.0000 mg | ORAL_TABLET | Freq: Two times a day (BID) | ORAL | Status: DC

## 2014-06-24 ENCOUNTER — Telehealth: Payer: Self-pay | Admitting: Family Medicine

## 2014-06-24 ENCOUNTER — Encounter: Payer: Self-pay | Admitting: *Deleted

## 2014-06-24 MED ORDER — "INSULIN SYRINGE-NEEDLE U-100 31G X 5/16"" 1 ML MISC": Status: DC

## 2014-06-24 NOTE — Telephone Encounter (Signed)
Tammy will place RX to [facility]

## 2014-06-24 NOTE — Telephone Encounter (Signed)
This encounter was created in error - please disregard.

## 2014-06-30 ENCOUNTER — Other Ambulatory Visit: Payer: Self-pay | Admitting: Family Medicine

## 2014-07-03 ENCOUNTER — Other Ambulatory Visit: Payer: Self-pay | Admitting: Nurse Practitioner

## 2014-07-06 ENCOUNTER — Telehealth: Payer: Self-pay | Admitting: *Deleted

## 2014-07-06 MED ORDER — BENAZEPRIL HCL 40 MG PO TABS: 40.0000 mg | ORAL_TABLET | Freq: Every day | ORAL | Status: DC

## 2014-07-06 MED ORDER — AMLODIPINE BESYLATE 10 MG PO TABS: 10.0000 mg | ORAL_TABLET | Freq: Every day | ORAL | Status: DC

## 2014-07-06 NOTE — Telephone Encounter (Signed)
done

## 2014-07-10 ENCOUNTER — Other Ambulatory Visit: Payer: Self-pay

## 2014-07-10 MED ORDER — METOPROLOL TARTRATE 100 MG PO TABS: 100.0000 mg | ORAL_TABLET | Freq: Two times a day (BID) | ORAL | Status: DC

## 2014-07-10 MED ORDER — GLIMEPIRIDE 4 MG PO TABS: 4.0000 mg | ORAL_TABLET | Freq: Two times a day (BID) | ORAL | Status: DC

## 2014-07-16 ENCOUNTER — Encounter: Payer: Self-pay | Admitting: *Deleted

## 2014-07-21 ENCOUNTER — Telehealth: Payer: Self-pay | Admitting: Family Medicine

## 2014-07-21 MED ORDER — FUROSEMIDE 40 MG PO TABS: 40.0000 mg | ORAL_TABLET | Freq: Two times a day (BID) | ORAL | Status: DC

## 2014-07-21 NOTE — Telephone Encounter (Signed)
Okay to refill lasix as requested

## 2014-07-26 ENCOUNTER — Other Ambulatory Visit: Payer: Self-pay | Admitting: Family Medicine

## 2014-07-27 ENCOUNTER — Other Ambulatory Visit: Payer: Self-pay | Admitting: *Deleted

## 2014-07-27 NOTE — Telephone Encounter (Signed)
This was handled in another encounter.

## 2014-07-27 NOTE — Telephone Encounter (Signed)
Last ov 7/15. Last AIC 7.3% on 7/15. Pt states we were suppose to order at last visit from mail order.

## 2014-08-05 ENCOUNTER — Other Ambulatory Visit: Payer: Self-pay | Admitting: *Deleted

## 2014-08-05 MED ORDER — AMLODIPINE BESYLATE 10 MG PO TABS: 10.0000 mg | ORAL_TABLET | Freq: Every day | ORAL | Status: DC

## 2014-08-19 ENCOUNTER — Ambulatory Visit (INDEPENDENT_AMBULATORY_CARE_PROVIDER_SITE_OTHER): Payer: Medicare Other

## 2014-08-19 DIAGNOSIS — Z23 Encounter for immunization: Secondary | ICD-10-CM

## 2014-08-25 ENCOUNTER — Other Ambulatory Visit: Payer: Self-pay | Admitting: Family Medicine

## 2014-10-06 ENCOUNTER — Ambulatory Visit: Payer: Medicare Other | Admitting: Family Medicine

## 2014-10-07 ENCOUNTER — Ambulatory Visit (INDEPENDENT_AMBULATORY_CARE_PROVIDER_SITE_OTHER): Payer: Medicare Other | Admitting: Family Medicine

## 2014-10-07 ENCOUNTER — Encounter: Payer: Self-pay | Admitting: Family Medicine

## 2014-10-07 VITALS — BP 141/54 | HR 64 | Temp 97.4°F | Ht 69.0 in | Wt 243.0 lb

## 2014-10-07 DIAGNOSIS — E785 Hyperlipidemia, unspecified: Secondary | ICD-10-CM

## 2014-10-07 DIAGNOSIS — I10 Essential (primary) hypertension: Secondary | ICD-10-CM

## 2014-10-07 DIAGNOSIS — E1165 Type 2 diabetes mellitus with hyperglycemia: Secondary | ICD-10-CM

## 2014-10-07 DIAGNOSIS — E1129 Type 2 diabetes mellitus with other diabetic kidney complication: Secondary | ICD-10-CM

## 2014-10-07 DIAGNOSIS — IMO0002 Reserved for concepts with insufficient information to code with codable children: Secondary | ICD-10-CM

## 2014-10-07 LAB — POCT GLYCOSYLATED HEMOGLOBIN (HGB A1C): Hemoglobin A1C: 7.7

## 2014-10-07 NOTE — Addendum Note (Signed)
Addended by: Wyline Mood on: 10/07/2014 08:53 AM   Modules accepted: Orders

## 2014-10-07 NOTE — Progress Notes (Signed)
   Subjective:    Patient ID: Michael Trevino, male    DOB: 1941-10-23, 73 y.o.   MRN: [id number]  HPI 72 year old gentleman here to follow-up diabetes. At his last visit we discontinued Actos. Home monitoring of glucose shows no sugars greater than 150. He is compliant as far as diet and medications. He has no other specific complaints today. He has tried to lose weight and by his record has lost about 7 pounds since his last visit here    Review of Systems  Constitutional: Negative.   HENT: Negative.   Eyes: Negative.   Respiratory: Negative.  Negative for shortness of breath.   Cardiovascular: Negative.  Negative for chest pain and leg swelling.  Gastrointestinal: Negative.   Genitourinary: Negative.   Musculoskeletal: Negative.   Skin: Negative.   Neurological: Negative.   Psychiatric/Behavioral: Negative.   All other systems reviewed and are negative.      Objective:   Physical Exam  Cardiovascular:  Murmur heard. There is a systolic murmur radiates to the carotids would presume this is aortic        Assessment & Plan:  1. Essential hypertension   2. DM (diabetes mellitus), type 2, uncontrolled, with renal complications Was supposed to check A1c today but I believe he left before that was done. Will assume it has not changed significantly given his home readings  3. Hyperlipidemia  Wardell Honour MD

## 2014-11-02 ENCOUNTER — Other Ambulatory Visit: Payer: Self-pay | Admitting: Family Medicine

## 2014-11-27 ENCOUNTER — Other Ambulatory Visit: Payer: Self-pay | Admitting: *Deleted

## 2014-11-27 DIAGNOSIS — E785 Hyperlipidemia, unspecified: Secondary | ICD-10-CM

## 2014-11-27 MED ORDER — ATORVASTATIN CALCIUM 40 MG PO TABS: 40.0000 mg | ORAL_TABLET | Freq: Every day | ORAL | Status: DC

## 2014-12-02 ENCOUNTER — Encounter: Payer: Self-pay | Admitting: *Deleted

## 2014-12-16 ENCOUNTER — Other Ambulatory Visit: Payer: Self-pay | Admitting: Family Medicine

## 2014-12-23 ENCOUNTER — Other Ambulatory Visit: Payer: Self-pay | Admitting: *Deleted

## 2014-12-23 MED ORDER — GLIMEPIRIDE 4 MG PO TABS: ORAL_TABLET | ORAL | Status: DC

## 2014-12-30 ENCOUNTER — Other Ambulatory Visit: Payer: Self-pay | Admitting: *Deleted

## 2014-12-30 MED ORDER — BENAZEPRIL HCL 40 MG PO TABS: 40.0000 mg | ORAL_TABLET | Freq: Every day | ORAL | Status: DC

## 2015-01-08 ENCOUNTER — Other Ambulatory Visit: Payer: Self-pay | Admitting: *Deleted

## 2015-01-08 MED ORDER — LEVOTHYROXINE SODIUM 100 MCG PO TABS: 100.0000 ug | ORAL_TABLET | Freq: Every day | ORAL | Status: DC

## 2015-01-27 ENCOUNTER — Other Ambulatory Visit: Payer: Self-pay | Admitting: *Deleted

## 2015-01-27 MED ORDER — AMLODIPINE BESYLATE 10 MG PO TABS: 10.0000 mg | ORAL_TABLET | Freq: Every day | ORAL | Status: DC

## 2015-02-08 DIAGNOSIS — I129 Hypertensive chronic kidney disease with stage 1 through stage 4 chronic kidney disease, or unspecified chronic kidney disease: Secondary | ICD-10-CM | POA: Diagnosis not present

## 2015-02-08 DIAGNOSIS — N183 Chronic kidney disease, stage 3 (moderate): Secondary | ICD-10-CM | POA: Diagnosis not present

## 2015-02-08 DIAGNOSIS — E1129 Type 2 diabetes mellitus with other diabetic kidney complication: Secondary | ICD-10-CM | POA: Diagnosis not present

## 2015-02-08 DIAGNOSIS — E785 Hyperlipidemia, unspecified: Secondary | ICD-10-CM | POA: Diagnosis not present

## 2015-02-16 ENCOUNTER — Other Ambulatory Visit: Payer: Self-pay

## 2015-02-16 MED ORDER — METOPROLOL TARTRATE 100 MG PO TABS: 100.0000 mg | ORAL_TABLET | Freq: Two times a day (BID) | ORAL | Status: DC

## 2015-04-06 ENCOUNTER — Encounter: Payer: Self-pay | Admitting: Family Medicine

## 2015-04-06 ENCOUNTER — Ambulatory Visit (INDEPENDENT_AMBULATORY_CARE_PROVIDER_SITE_OTHER): Payer: Medicare Other | Admitting: Family Medicine

## 2015-04-06 VITALS — BP 145/59 | HR 59 | Temp 98.7°F | Ht 69.0 in | Wt 241.0 lb

## 2015-04-06 DIAGNOSIS — IMO0002 Reserved for concepts with insufficient information to code with codable children: Secondary | ICD-10-CM

## 2015-04-06 DIAGNOSIS — E039 Hypothyroidism, unspecified: Secondary | ICD-10-CM | POA: Diagnosis not present

## 2015-04-06 DIAGNOSIS — E1129 Type 2 diabetes mellitus with other diabetic kidney complication: Secondary | ICD-10-CM

## 2015-04-06 DIAGNOSIS — E785 Hyperlipidemia, unspecified: Secondary | ICD-10-CM

## 2015-04-06 DIAGNOSIS — E1165 Type 2 diabetes mellitus with hyperglycemia: Secondary | ICD-10-CM | POA: Diagnosis not present

## 2015-04-06 DIAGNOSIS — Z23 Encounter for immunization: Secondary | ICD-10-CM | POA: Diagnosis not present

## 2015-04-06 DIAGNOSIS — I10 Essential (primary) hypertension: Secondary | ICD-10-CM | POA: Diagnosis not present

## 2015-04-06 LAB — POCT UA - MICROALBUMIN: Microalbumin Ur, POC: 100 mg/L

## 2015-04-06 LAB — POCT GLYCOSYLATED HEMOGLOBIN (HGB A1C): Hemoglobin A1C: 7.8

## 2015-04-06 MED ORDER — FUROSEMIDE 40 MG PO TABS: 40.0000 mg | ORAL_TABLET | Freq: Two times a day (BID) | ORAL | Status: DC

## 2015-04-06 MED ORDER — LEVOTHYROXINE SODIUM 100 MCG PO TABS: 100.0000 ug | ORAL_TABLET | Freq: Every day | ORAL | Status: DC

## 2015-04-06 MED ORDER — ATORVASTATIN CALCIUM 40 MG PO TABS: 40.0000 mg | ORAL_TABLET | Freq: Every day | ORAL | Status: DC

## 2015-04-06 MED ORDER — AMLODIPINE BESYLATE 10 MG PO TABS: 10.0000 mg | ORAL_TABLET | Freq: Every day | ORAL | Status: DC

## 2015-04-06 MED ORDER — BENAZEPRIL HCL 40 MG PO TABS: 40.0000 mg | ORAL_TABLET | Freq: Every day | ORAL | Status: DC

## 2015-04-06 MED ORDER — METOPROLOL TARTRATE 100 MG PO TABS: 100.0000 mg | ORAL_TABLET | Freq: Two times a day (BID) | ORAL | Status: DC

## 2015-04-06 MED ORDER — GLIMEPIRIDE 4 MG PO TABS: ORAL_TABLET | ORAL | Status: DC
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
## 2015-04-06 NOTE — Patient Instructions (Signed)

## 2015-04-07 ENCOUNTER — Ambulatory Visit: Payer: Medicare Other | Admitting: Family Medicine

## 2015-04-07 LAB — CMP14+EGFR
ALT: 16 IU/L (ref 0–44)
AST: 19 IU/L (ref 0–40)
Albumin/Globulin Ratio: 1.4 (ref 1.1–2.5)
Albumin: 4 g/dL (ref 3.5–4.8)
Alkaline Phosphatase: 88 IU/L (ref 39–117)
BUN/Creatinine Ratio: 10 (ref 10–22)
BUN: 16 mg/dL (ref 8–27)
Bilirubin Total: 0.4 mg/dL (ref 0.0–1.2)
CO2: 23 mmol/L (ref 18–29)
Calcium: 8.7 mg/dL (ref 8.6–10.2)
Chloride: 103 mmol/L (ref 97–108)
Creatinine, Ser: 1.68 mg/dL — ABNORMAL HIGH (ref 0.76–1.27)
GFR calc Af Amer: 46 mL/min/{1.73_m2} — ABNORMAL LOW (ref >59)
GFR calc non Af Amer: 39 mL/min/{1.73_m2} — ABNORMAL LOW (ref >59)
Globulin, Total: 2.9 g/dL (ref 1.5–4.5)
Glucose: 102 mg/dL — ABNORMAL HIGH (ref 65–99)
Potassium: 4.6 mmol/L (ref 3.5–5.2)
Sodium: 141 mmol/L (ref 134–144)
Total Protein: 6.9 g/dL (ref 6.0–8.5)

## 2015-04-07 LAB — LIPID PANEL
Chol/HDL Ratio: 4.8 ratio units (ref 0.0–5.0)
Cholesterol, Total: 155 mg/dL (ref 100–199)
HDL: 32 mg/dL — ABNORMAL LOW (ref >39)
LDL Calculated: 94 mg/dL (ref 0–99)
Triglycerides: 143 mg/dL (ref 0–149)
VLDL Cholesterol Cal: 29 mg/dL (ref 5–40)

## 2015-04-07 LAB — TSH: TSH: 4.55 u[IU]/mL — ABNORMAL HIGH (ref 0.450–4.500)

## 2015-04-07 LAB — MICROALBUMIN, URINE: Microalbumin, Urine: 3651 ug/mL

## 2015-04-16 ENCOUNTER — Ambulatory Visit (INDEPENDENT_AMBULATORY_CARE_PROVIDER_SITE_OTHER): Payer: Medicare Other | Admitting: Pharmacist

## 2015-04-16 ENCOUNTER — Encounter: Payer: Self-pay | Admitting: Pharmacist

## 2015-04-16 VITALS — BP 128/52 | HR 66 | Ht 69.0 in | Wt 244.0 lb

## 2015-04-16 DIAGNOSIS — Z0000 Encounter for general adult medical examination without abnormal findings: Secondary | ICD-10-CM

## 2015-04-16 DIAGNOSIS — N183 Chronic kidney disease, stage 3 unspecified: Secondary | ICD-10-CM

## 2015-04-16 DIAGNOSIS — E1129 Type 2 diabetes mellitus with other diabetic kidney complication: Secondary | ICD-10-CM

## 2015-04-16 DIAGNOSIS — E1165 Type 2 diabetes mellitus with hyperglycemia: Secondary | ICD-10-CM

## 2015-04-16 DIAGNOSIS — I10 Essential (primary) hypertension: Secondary | ICD-10-CM

## 2015-04-16 DIAGNOSIS — IMO0002 Reserved for concepts with insufficient information to code with codable children: Secondary | ICD-10-CM

## 2015-04-16 NOTE — Patient Instructions (Addendum)
Michael Trevino , Thank you for taking time to come for your Medicare Wellness Visit. I appreciate your ongoing commitment to your health goals. Please review the following plan we discussed and let me know if I can assist you in the future.   Over due for eye exam - make appointment with Dr Earlie Server office as soon as possible  Please ask Dr Mercy Moore to send our office a copy of his visit when you see him in the next 1-2 months   This is a list of the screening recommended for you and due dates:  Health Maintenance  Topic Date Due  . Shingles Vaccine  Postponed due to cost  . Complete foot exam   Done today  . Eye exam for diabetics  Due now  . Tetanus Vaccine  Postponed due to cost  . Flu Shot  06/14/2015  . Hemoglobin A1C  10/07/2015  . Urine Protein Check  10/2005  . Colon Cancer Screening  01/31/2024  . Pneumonia vaccines  Completed  *Topic was postponed. The date shown is not the original due date.    Fall Prevention and Home Safety Falls cause injuries and can affect all age groups. It is possible to use preventive measures to significantly decrease the likelihood of falls. There are many simple measures which can make your home safer and prevent falls. OUTDOORS  Repair cracks and edges of walkways and driveways.  Remove high doorway thresholds.  Trim shrubbery on the main path into your home.  Have good outside lighting.  Clear walkways of tools, rocks, debris, and clutter.  Check that handrails are not broken and are securely fastened. Both sides of steps should have handrails.  Have leaves, snow, and ice cleared regularly.  Use sand or salt on walkways during winter months.  In the garage, clean up grease or oil spills. BATHROOM  Install night lights.  Install grab bars by the toilet and in the tub and shower.  Use non-skid mats or decals in the tub or shower.  Place a plastic non-slip stool in the shower to sit on, if needed.  Keep floors dry and clean up  all water on the floor immediately.  Remove soap buildup in the tub or shower on a regular basis.  Secure bath mats with non-slip, double-sided rug tape.  Remove throw rugs and tripping hazards from the floors. BEDROOMS  Install night lights.  Make sure a bedside light is easy to reach.  Do not use oversized bedding.  Keep a telephone by your bedside.  Have a firm chair with side arms to use for getting dressed.  Remove throw rugs and tripping hazards from the floor. KITCHEN  Keep handles on pots and pans turned toward the center of the stove. Use back burners when possible.  Clean up spills quickly and allow time for drying.  Avoid walking on wet floors.  Avoid hot utensils and knives.  Position shelves so they are not too high or low.  Place commonly used objects within easy reach.  If necessary, use a sturdy step stool with a grab bar when reaching.  Keep electrical cables out of the way.  Do not use floor polish or wax that makes floors slippery. If you must use wax, use non-skid floor wax.  Remove throw rugs and tripping hazards from the floor. STAIRWAYS  Never leave objects on stairs.  Place handrails on both sides of stairways and use them. Fix any loose handrails. Make sure handrails on both sides of  the stairways are as long as the stairs.  Check carpeting to make sure it is firmly attached along stairs. Make repairs to worn or loose carpet promptly.  Avoid placing throw rugs at the top or bottom of stairways, or properly secure the rug with carpet tape to prevent slippage. Get rid of throw rugs, if possible.  Have an electrician put in a light switch at the top and bottom of the stairs. OTHER FALL PREVENTION TIPS  Wear low-heel or rubber-soled shoes that are supportive and fit well. Wear closed toe shoes.  When using a stepladder, make sure it is fully opened and both spreaders are firmly locked. Do not climb a closed stepladder.  Add color or  contrast paint or tape to grab bars and handrails in your home. Place contrasting color strips on first and last steps.  Learn and use mobility aids as needed. Install an electrical emergency response system.  Turn on lights to avoid dark areas. Replace light bulbs that burn out immediately. Get light switches that glow.  Arrange furniture to create clear pathways. Keep furniture in the same place.  Firmly attach carpet with non-skid or double-sided tape.  Eliminate uneven floor surfaces.  Select a carpet pattern that does not visually hide the edge of steps.  Be aware of all pets. OTHER HOME SAFETY TIPS  Set the water temperature for 120 F (48.8 C).  Keep emergency numbers on or near the telephone.  Keep smoke detectors on every level of the home and near sleeping areas. Document Released: 10/20/2002 Document Revised: 04/30/2012 Document Reviewed: 01/19/2012 [facility] Patient Information 2015 [place], [place]. This information is not intended to replace advice given to you by your health care provider. Make sure you discuss any questions you have with your health care provider.  Diabetes and Standards of Medical Care   Diabetes is complicated. You may find that your diabetes team includes a dietitian, nurse, diabetes educator, eye doctor, and more. To help everyone know what is going on and to help you get the care you deserve, the following schedule of care was developed to help keep you on track. Below are the tests, exams, vaccines, medicines, education, and plans you will need.  Blood Glucose Goals Prior to meals = 80 - 130 Within 2 hours of the start of a meal = less than 180  HbA1c test (goal is less than 7.0% - your last value was %) This test shows how well you have controlled your glucose over the past 2 to 3 months. It is used to see if your diabetes management plan needs to be adjusted.   It is performed at least 2 times a year if you are meeting treatment goals.  It  is performed 4 times a year if therapy has changed or if you are not meeting treatment goals.  Blood pressure test  This test is performed at every routine medical visit. The goal is less than 140/90 mmHg for most people, but 130/80 mmHg in some cases. Ask your health care provider about your goal.  Dental exam  Follow up with the dentist regularly.  Eye exam  If you are diagnosed with type 1 diabetes as a child, get an exam upon reaching the age of 59 years or older and have had diabetes for 3 to 5 years. Yearly eye exams are recommended after that initial eye exam.  If you are diagnosed with type 1 diabetes as an adult, get an exam within 5 years of diagnosis and  then yearly.  If you are diagnosed with type 2 diabetes, get an exam as soon as possible after the diagnosis and then yearly.  Foot care exam  Visual foot exams are performed at every routine medical visit. The exams check for cuts, injuries, or other problems with the feet.  A comprehensive foot exam should be done yearly. This includes visual inspection as well as assessing foot pulses and testing for loss of sensation.  Check your feet nightly for cuts, injuries, or other problems with your feet. Tell your health care provider if anything is not healing.  Kidney function test (urine microalbumin)  This test is performed once a year.  Type 1 diabetes: The first test is performed 5 years after diagnosis.  Type 2 diabetes: The first test is performed at the time of diagnosis.  A serum creatinine and estimated glomerular filtration rate (eGFR) test is done once a year to assess the level of chronic kidney disease (CKD), if present.  Lipid profile (cholesterol, HDL, LDL, triglycerides)  Performed every 5 years for most people.  The goal for LDL is less than 100 mg/dL. If you are at high risk, the goal is less than 70 mg/dL.  The goal for HDL is 40 mg/dL to 50 mg/dL for men and 50 mg/dL to 60 mg/dL for women. An HDL  cholesterol of 60 mg/dL or higher gives some protection against heart disease.  The goal for triglycerides is less than 150 mg/dL.  Influenza vaccine, pneumococcal vaccine, and hepatitis B vaccine  The influenza vaccine is recommended yearly.  The pneumococcal vaccine is generally given once in a lifetime. However, there are some instances when another vaccination is recommended. Check with your health care provider.  The hepatitis B vaccine is also recommended for adults with diabetes.  Diabetes self-management education  Education is recommended at diagnosis and ongoing as needed.  Treatment plan  Your treatment plan is reviewed at every medical visit.  Document Released: 08/27/2009 Document Revised: 07/02/2013 Document Reviewed: 04/01/2013 [facility] Patient Information 2014 [place].  Health Maintenance A healthy lifestyle and preventative care can promote health and wellness.  Maintain regular health, dental, and eye exams.  Eat a healthy diet. Foods like vegetables, fruits, whole grains, low-fat dairy products, and lean protein foods contain the nutrients you need and are low in calories. Decrease your intake of foods high in solid fats, added sugars, and salt. Get information about a proper diet from your health care provider, if necessary.  Regular physical exercise is one of the most important things you can do for your health. Most adults should get at least 150 minutes of moderate-intensity exercise (any activity that increases your heart rate and causes you to sweat) each week. In addition, most adults need muscle-strengthening exercises on 2 or more days a week.   Maintain a healthy weight. The body mass index (BMI) is a screening tool to identify possible weight problems. It provides an estimate of body fat based on height and weight. Your health care provider can find your BMI and can help you achieve or maintain a healthy weight. For males 20 years and  older:  A BMI below 18.5 is considered underweight.  A BMI of 18.5 to 24.9 is normal.  A BMI of 25 to 29.9 is considered overweight.  A BMI of 30 and above is considered obese.  Maintain normal blood lipids and cholesterol by exercising and minimizing your intake of saturated fat. Eat a balanced diet with plenty of fruits  and vegetables. Blood tests for lipids and cholesterol should begin at age 85 and be repeated every 5 years. If your lipid or cholesterol levels are high, you are over age 70, or you are at high risk for heart disease, you may need your cholesterol levels checked more frequently.Ongoing high lipid and cholesterol levels should be treated with medicines if diet and exercise are not working.  If you smoke, find out from your health care provider how to quit. If you do not use tobacco, do not start.  Lung cancer screening is recommended for adults aged 37-80 years who are at high risk for developing lung cancer because of a history of smoking. A yearly low-dose CT scan of the lungs is recommended for people who have at least a 30-pack-year history of smoking and are current smokers or have quit within the past 15 years. A pack year of smoking is smoking an average of 1 pack of cigarettes a day for 1 year (for example, a 30-pack-year history of smoking could mean smoking 1 pack a day for 30 years or 2 packs a day for 15 years). Yearly screening should continue until the smoker has stopped smoking for at least 15 years. Yearly screening should be stopped for people who develop a health problem that would prevent them from having lung cancer treatment.  If you choose to drink alcohol, do not have more than 2 drinks per day. One drink is considered to be 12 oz (360 mL) of beer, 5 oz (150 mL) of wine, or 1.5 oz (45 mL) of liquor.  Avoid the use of street drugs. Do not share needles with anyone. Ask for help if you need support or instructions about stopping the use of drugs.  High  blood pressure causes heart disease and increases the risk of stroke. Blood pressure should be checked at least every 1-2 years. Ongoing high blood pressure should be treated with medicines if weight loss and exercise are not effective.  If you are 42-64 years old, ask your health care provider if you should take aspirin to prevent heart disease.  Diabetes screening involves taking a blood sample to check your fasting blood sugar level. This should be done once every 3 years after age 60 if you are at a normal weight and without risk factors for diabetes. Testing should be considered at a younger age or be carried out more frequently if you are overweight and have at least 1 risk factor for diabetes.  Colorectal cancer can be detected and often prevented. Most routine colorectal cancer screening begins at the age of 67 and continues through age 6. However, your health care provider may recommend screening at an earlier age if you have risk factors for colon cancer. On a yearly basis, your health care provider may provide home test kits to check for hidden blood in the stool. A small camera at the end of a tube may be used to directly examine the colon (sigmoidoscopy or colonoscopy) to detect the earliest forms of colorectal cancer. Talk to your health care provider about this at age 91 when routine screening begins. A direct exam of the colon should be repeated every 5-10 years through age 63, unless early forms of precancerous polyps or small growths are found.  People who are at an increased risk for hepatitis B should be screened for this virus. You are considered at high risk for hepatitis B if:  You were born in a country where hepatitis B occurs often.  Talk with your health care provider about which countries are considered high risk.  Your parents were born in a high-risk country and you have not received a shot to protect against hepatitis B (hepatitis B vaccine).  You have HIV or AIDS.  You  use needles to inject street drugs.  You live with, or have sex with, someone who has hepatitis B.  You are a man who has sex with other men (MSM).  You get hemodialysis treatment.  You take certain medicines for conditions like cancer, organ transplantation, and autoimmune conditions.  Hepatitis C blood testing is recommended for all people born from [birth year] through 1965 and any individual with known risk factors for hepatitis C.  Healthy men should no longer receive prostate-specific antigen (PSA) blood tests as part of routine cancer screening. Talk to your health care provider about prostate cancer screening.  Testicular cancer screening is not recommended for adolescents or adult males who have no symptoms. Screening includes self-exam, a health care provider exam, and other screening tests. Consult with your health care provider about any symptoms you have or any concerns you have about testicular cancer.  Practice safe sex. Use condoms and avoid high-risk sexual practices to reduce the spread of sexually transmitted infections (STIs).  You should be screened for STIs, including gonorrhea and chlamydia if:  You are sexually active and are younger than 24 years.  You are older than 24 years, and your health care provider tells you that you are at risk for this type of infection.  Your sexual activity has changed since you were last screened, and you are at an increased risk for chlamydia or gonorrhea. Ask your health care provider if you are at risk.  If you are at risk of being infected with HIV, it is recommended that you take a prescription medicine daily to prevent HIV infection. This is called pre-exposure prophylaxis (PrEP). You are considered at risk if:  You are a man who has sex with other men (MSM).  You are a heterosexual man who is sexually active with multiple partners.  You take drugs by injection.  You are sexually active with a partner who has HIV.  Talk with  your health care provider about whether you are at high risk of being infected with HIV. If you choose to begin PrEP, you should first be tested for HIV. You should then be tested every 3 months for as long as you are taking PrEP.  Use sunscreen. Apply sunscreen liberally and repeatedly throughout the day. You should seek shade when your shadow is shorter than you. Protect yourself by wearing long sleeves, pants, a wide-brimmed hat, and sunglasses year round whenever you are outdoors.  Tell your health care provider of new moles or changes in moles, especially if there is a change in shape or color. Also, tell your health care provider if a mole is larger than the size of a pencil eraser.  A one-time screening for abdominal aortic aneurysm (AAA) and surgical repair of large AAAs by ultrasound is recommended for men aged 40-75 years who are current or former smokers.  Stay current with your vaccines (immunizations). Document Released: 04/27/2008 Document Revised: 11/04/2013 Document Reviewed: 03/27/2011 [facility] Patient Information 2015 [place], [place]. This information is not intended to replace advice given to you by your health care provider. Make sure you discuss any questions you have with your health care provider.

## 2015-04-16 NOTE — Progress Notes (Signed)
Patient ID: Michael Trevino, male   DOB: [birth year], 74 y.o.   MRN: [id number]    Subjective:   Michael Trevino is a 74 y.o. male who presents for an Initial Medicare Annual Wellness Visit.    Current Medications (verified) Outpatient Encounter Prescriptions as of 04/16/2015  Medication Sig  . amLODipine (NORVASC) 10 MG tablet Take 1 tablet (10 mg total) by mouth daily.  Marland Kitchen ascorbic acid (VITAMIN C) 500 MG tablet Take 500 mg by mouth daily.  Marland Kitchen aspirin 325 MG EC tablet Take 325 mg by mouth daily.  Marland Kitchen atorvastatin (LIPITOR) 40 MG tablet Take 1 tablet (40 mg total) by mouth daily.  . benazepril (LOTENSIN) 40 MG tablet Take 1 tablet (40 mg total) by mouth daily.  . furosemide (LASIX) 40 MG tablet Take 1 tablet (40 mg total) by mouth 2 (two) times daily.  Marland Kitchen glimepiride (AMARYL) 4 MG tablet TAKE 1 TABLET (4 MG TOTAL) BY MOUTH 2 (TWO) TIMES DAILY.  Marland Kitchen Insulin Detemir (LEVEMIR FLEXPEN) 100 UNIT/ML SOPN Inject 47 Units into the skin daily.  . Insulin Pen Needle 31G X 5 MM MISC Use daily  . levothyroxine (SYNTHROID, LEVOTHROID) 100 MCG tablet Take 1 tablet (100 mcg total) by mouth daily before breakfast.  . linagliptin (TRADJENTA) 5 MG TABS tablet Take 1 tablet (5 mg total) by mouth daily.  . metoprolol (LOPRESSOR) 100 MG tablet Take 1 tablet (100 mg total) by mouth 2 (two) times daily.  . Insulin Syringe-Needle U-100 (BD INSULIN SYRINGE ULTRAFINE) 31G X 5/16" 1 ML MISC Use to inject insulin daily.  Dx:  250.02 (Patient not taking: Reported on 04/16/2015)   No facility-administered encounter medications on file as of 04/16/2015.    Allergies (verified) Zocor   History: Past Medical History  Diagnosis Date  . Hyperlipidemia   . Hypertension   . MI (myocardial infarction) 2000  . Diabetes mellitus without complication   . Arthritis   . Sepsis   . Thyroid disease     hypothyroid  . Renal disorder    Past Surgical History  Procedure Laterality Date  . Tee without cardioversion N/A 02/26/2013   Procedure: TRANSESOPHAGEAL ECHOCARDIOGRAM (TEE);  Surgeon: Thayer Headings, MD;  Location: [facility] ENDOSCOPY;  Service: Cardiovascular;  Laterality: N/A;   Family History  Problem Relation Age of Onset  . Diabetes Mother   . Hypertension Mother   . Cancer Father   . Stroke Brother   . Alcohol abuse Brother   . Diabetes Brother   . Diabetes Sister   . Diabetes Brother 13    TYPE 1  . Kidney disease Brother 100    DIALYSIS   Social History   Occupational History  . Not on file.   Social History Main Topics  . Smoking status: Former Smoker -- 0.50 packs/day for 30 years    Types: Cigarettes    Quit date: 11/05/1984  . Smokeless tobacco: Never Used  . Alcohol Use: No  . Drug Use: No  . Sexual Activity: Yes    Do you feel safe at home?  Yes  Dietary issues and exercise activities discussed: Current Exercise Habits:: Home exercise routine, Type of exercise: walking, Time (Minutes): 20, Frequency (Times/Week): 3, Weekly Exercise (Minutes/Week): 60, Intensity: Mild  Current Dietary habits:  Tries to limit CHO but admits that he has forgotten some CHO counting information over the last 2 years.    Cardiac Risk Factors include: advanced age (>65men, >21 women);obesity (BMI >30kg/m2);microalbuminuria;diabetes mellitus;dyslipidemia;family history of premature cardiovascular disease;hypertension;male gender  Objective:    Today's Vitals   04/16/15 1121  BP: 128/52  Pulse: 66  Height: 5\' 9"  (1.753 m)  Weight: 244 lb (110.678 kg)  PainSc: 0-No pain   Body mass index is 36.02 kg/(m^2).   Activities of Daily Living In your present state of health, do you have any difficulty performing the following activities: 04/16/2015  Hearing? N  Vision? N  Difficulty concentrating or making decisions? N  Walking or climbing stairs? N  Dressing or bathing? N  Doing errands, shopping? N  Preparing Food and eating ? N  Using the Toilet? N  In the past six months, have you accidently leaked  urine? N  Do you have problems with loss of bowel control? N  Managing your Medications? N  Managing your Finances? N  Housekeeping or managing your Housekeeping? N    Are there smokers in your home (other than you)? No    Depression Screen PHQ 2/9 Scores 04/16/2015 10/07/2014 03/09/2014  PHQ - 2 Score 0 0 0    Fall Risk Fall Risk  04/16/2015 10/07/2014 03/09/2014 10/30/2013  Falls in the past year? No No No No    Cognitive Function: MMSE - Mini Mental State Exam 04/16/2015  Orientation to time 5  Orientation to Place 5  Registration 3  Attention/ Calculation 4  Recall 3  Language- name 2 objects 2  Language- repeat 1  Language- follow 3 step command 3  Language- read & follow direction 1  Write a sentence 1  Copy design 1  Total score 29    Immunizations and Health Maintenance Immunization History  Administered Date(s) Administered  . Influenza Whole 08/25/2008  . Influenza,inj,Quad PF,36+ Mos 08/19/2014  . Pneumococcal Conjugate-13 04/06/2015  . Pneumococcal Polysaccharide-23 10/06/2005, 12/12/2007   There are no preventive care reminders to display for this patient.  Patient Care Team: Wardell Honour, MD as PCP - General (Family Medicine) Truc Manus Gunning, OD as Consulting Physician (Optometry) Fleet Contras, MD as Consulting Physician (Nephrology)  Indicate any recent Medical Services you may have received from other than [facility] providers in the past year (date may be approximate).    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 OPHTHALMOLOGY EXAM  05/07/2015 (Originally 02/08/1951)  . TETANUS/TDAP  05/07/2015 (Originally 02/08/1960)  . ZOSTAVAX  07/13/2015 (Originally 02/07/2001)  . INFLUENZA VACCINE  06/14/2015  . HEMOGLOBIN A1C  10/07/2015  . URINE MICROALBUMIN  04/05/2016  . FOOT EXAM  04/15/2016  .  COLONOSCOPY  01/31/2024  . PNA vac Low Risk Adult  Completed        Plan:   During the course of the visit Michael Trevino was educated and counseled about the following appropriate screening and preventive services:   Vaccines to include Pneumoccal, Influenza, Hepatitis B, Td, Zostavax - shingles and Tetanus vaccines postponed due to cost  Colorectal cancer screening - colonoscopy UTD.  FOBT due - given in office today.  Cardiovascular disease screening - BP at goal today.  LDL and tg at goal.   Elevated microalbumin - patient is on benazepril.  He has follow up with his nephrologist in August.   Diabetic foot exam performed today.    Glaucoma screening / Diabetic Eye Exam - reminded patient that this was due and offered to make appt for him.  He said his wife was planning to make an appt for both of them this month.  Nutrition counseling - reviewed CHO counting diet - serving size recommendations and recommended not to exceed 50 grams of CHO per meal or 20 per snack  Advanced Directives - packet given to patient in office   Patient Instructions (the written plan) were given to the patient.   Cherre Robins, [facility]   04/17/2015

## 2015-04-21 ENCOUNTER — Telehealth: Payer: Self-pay | Admitting: Family Medicine

## 2015-05-10 ENCOUNTER — Other Ambulatory Visit: Payer: Self-pay | Admitting: Family Medicine

## 2015-06-03 ENCOUNTER — Other Ambulatory Visit: Payer: Self-pay | Admitting: Family

## 2015-06-29 ENCOUNTER — Encounter: Payer: Self-pay | Admitting: *Deleted

## 2015-06-30 DIAGNOSIS — H11151 Pinguecula, right eye: Secondary | ICD-10-CM | POA: Diagnosis not present

## 2015-06-30 DIAGNOSIS — E11329 Type 2 diabetes mellitus with mild nonproliferative diabetic retinopathy without macular edema: Secondary | ICD-10-CM | POA: Diagnosis not present

## 2015-06-30 DIAGNOSIS — H2513 Age-related nuclear cataract, bilateral: Secondary | ICD-10-CM | POA: Diagnosis not present

## 2015-06-30 DIAGNOSIS — Z794 Long term (current) use of insulin: Secondary | ICD-10-CM | POA: Diagnosis not present

## 2015-06-30 LAB — HM DIABETES EYE EXAM

## 2015-07-05 ENCOUNTER — Telehealth: Payer: Self-pay | Admitting: *Deleted

## 2015-07-05 NOTE — Telephone Encounter (Signed)
Patient was not at home but I spoke to his wife.  She states that he has not been checking BG as often as he use to but he has not had any high BG readings.  I tole her to have him start checking BG at least once daily.  If he gets BG over 200 to call me at office for instructions.  Appt to follow up 07/14/15.

## 2015-07-05 NOTE — Telephone Encounter (Signed)
[facility] with [facility] states that she went out to see Mr. Shellman today and he had 4+glucose in his urine.

## 2015-07-14 ENCOUNTER — Encounter: Payer: Self-pay | Admitting: Pharmacist

## 2015-07-14 ENCOUNTER — Ambulatory Visit (INDEPENDENT_AMBULATORY_CARE_PROVIDER_SITE_OTHER): Payer: Medicare Other | Admitting: Pharmacist

## 2015-07-14 VITALS — BP 138/70 | HR 75 | Ht 69.0 in | Wt 247.0 lb

## 2015-07-14 DIAGNOSIS — E1129 Type 2 diabetes mellitus with other diabetic kidney complication: Secondary | ICD-10-CM | POA: Diagnosis not present

## 2015-07-14 DIAGNOSIS — E039 Hypothyroidism, unspecified: Secondary | ICD-10-CM

## 2015-07-14 DIAGNOSIS — N183 Chronic kidney disease, stage 3 unspecified: Secondary | ICD-10-CM

## 2015-07-14 DIAGNOSIS — E1165 Type 2 diabetes mellitus with hyperglycemia: Secondary | ICD-10-CM

## 2015-07-14 DIAGNOSIS — IMO0002 Reserved for concepts with insufficient information to code with codable children: Secondary | ICD-10-CM

## 2015-07-14 LAB — POCT GLYCOSYLATED HEMOGLOBIN (HGB A1C): Hemoglobin A1C: 7.8

## 2015-07-14 MED ORDER — GLUCOSE BLOOD VI STRP: ORAL_STRIP | Status: AC

## 2015-07-14 NOTE — Progress Notes (Signed)
Diabetes Follow-Up Visit Chief Complaint:   Chief Complaint  Patient presents with  . Diabetes     Filed Vitals:   07/14/15 0855  BP: 138/70  Pulse: 75     HPI:  Patient with long standing diabetes.   Last A1c was 7.8% from 04/06/2015.  Due to recheck today.  Current Diabetes Medications:  Levemir 47 units daily, glimepiride 41m bid and Tradgenta 540mdaily  Low fat/carbohydrate diet? Trying to limit high CHO foods.   Nicotine Abuse?  No Medication Compliance?  Yes Exercise?  No Alcohol Abuse?  No  Home BG Monitoring:  Checking only once or twice a week Mr. ZiHeggseports that he has been buying testing supplies.   30 day HBG average = 96 HBG readings = 96, 99, 86, 101, 161, 133, 183, 111  Exam Edema:  negative   Polyuria:  negative  Polydipsia:  negative  Polyphagia:  negative  BMI:  Body mass index is 36.46 kg/(m^2).    Weight changes:  Has increase 3# since 03/2015 General Appearance:  alert, oriented, no acute distress Mood/Affect:  normal    Lab Results  Component Value Date   HGBA1C 7.8 07/14/2015    No results found for: [facility] Lab Results  Component Value Date   CHOL 155 04/06/2015   HDL 32* 04/06/2015   LDLCALC 94 04/06/2015   TRIG 143 04/06/2015   CHOLHDL 4.8 04/06/2015      Assessment: 1.  Diabetes.  Not at goal but improved compared to 1 year ago.   2.  Blood Pressure - at goal today 3.  Lipids - patient on appropriate dose of statin 4.  Hypothyroidism - last TSH was slightlyelevated 5.  Elevated microalbumin   Recommendations: 1.  Medication recommendations at this time are as follows:  Continue all current medications 2.  Reviewed HBG goals:  Fasting 80-130 and 1-2 hour post prandial <180.  Patient is instructed to check BG 1 times per day.   Advised that his medicare plan should pay for testing supplies.  Rx was sent to pharmacy for test strips to test qd. 3.  BP goal < 140/80. 4.  LDL goal of < 100, HDL > 40 and TG < 150. 5.   Eye Exam yearly and Dental Exam every 6 months. - patient reports that eye exam was done within last 2 month.  Calling to request copy of visit from Dr TrRona Ravens6.  Dietary recommendations:  Reviewed CHO limiting diet.  7.  Physical Activity recommendations:  Start exercise with 10 minutes daily and work up to 30 minutes as tolerated.   8.  Return to clinic in 3 months - [facility]  Orders Placed This Encounter  Procedures  . CMP14+EGFR  . Thyroid Panel With TSH  . Microalbumin / creatinine urine ratio  . POCT glycosylated hemoglobin (Hb A1C)    Time spent counseling patient:  45 minutes  Referring provider:  MiGwynneth AlimentPharmD, CPP, CDE

## 2015-07-14 NOTE — Patient Instructions (Signed)
Diabetes and Standards of Medical Care   Diabetes is complicated. You may find that your diabetes team includes a dietitian, nurse, diabetes educator, eye doctor, and more. To help everyone know what is going on and to help you get the care you deserve, the following schedule of care was developed to help keep you on track. Below are the tests, exams, vaccines, medicines, education, and plans you will need.  Blood Glucose Goals Prior to meals = 80 - 130 Within 2 hours of the start of a meal = less than 180  HbA1c test (goal is less than 7.0% - your last value was 7.8%) This test shows how well you have controlled your glucose over the past 2 to 3 months. It is used to see if your diabetes management plan needs to be adjusted.   It is performed at least 2 times a year if you are meeting treatment goals.  It is performed 4 times a year if therapy has changed or if you are not meeting treatment goals.  Blood pressure test  This test is performed at every routine medical visit. The goal is less than 140/90 mmHg for most people, but 130/80 mmHg in some cases. Ask your health care provider about your goal.  Dental exam  Follow up with the dentist regularly.  Eye exam  If you are diagnosed with type 1 diabetes as a child, get an exam upon reaching the age of 10 years or older and have had diabetes for 3 to 5 years. Yearly eye exams are recommended after that initial eye exam.  If you are diagnosed with type 1 diabetes as an adult, get an exam within 5 years of diagnosis and then yearly.  If you are diagnosed with type 2 diabetes, get an exam as soon as possible after the diagnosis and then yearly.  Foot care exam  Visual foot exams are performed at every routine medical visit. The exams check for cuts, injuries, or other problems with the feet.  A comprehensive foot exam should be done yearly. This includes visual inspection as well as assessing foot pulses and testing for loss of  sensation.  Check your feet nightly for cuts, injuries, or other problems with your feet. Tell your health care provider if anything is not healing.  Kidney function test (urine microalbumin)  This test is performed once a year.  Type 1 diabetes: The first test is performed 5 years after diagnosis.  Type 2 diabetes: The first test is performed at the time of diagnosis.  A serum creatinine and estimated glomerular filtration rate (eGFR) test is done once a year to assess the level of chronic kidney disease (CKD), if present.  Lipid profile (cholesterol, HDL, LDL, triglycerides)  Performed every 5 years for most people.  The goal for LDL is less than 100 mg/dL. If you are at high risk, the goal is less than 70 mg/dL.  The goal for HDL is 40 mg/dL to 50 mg/dL for men and 50 mg/dL to 60 mg/dL for women. An HDL cholesterol of 60 mg/dL or higher gives some protection against heart disease.  The goal for triglycerides is less than 150 mg/dL.  Influenza vaccine, pneumococcal vaccine, and hepatitis B vaccine  The influenza vaccine is recommended yearly.  The pneumococcal vaccine is generally given once in a lifetime. However, there are some instances when another vaccination is recommended. Check with your health care provider.  The hepatitis B vaccine is also recommended for adults with diabetes.    Diabetes self-management education  Education is recommended at diagnosis and ongoing as needed.  Treatment plan  Your treatment plan is reviewed at every medical visit.  Document Released: 08/27/2009 Document Revised: 07/02/2013 Document Reviewed: 04/01/2013 ExitCare Patient Information 2014 [employer].   

## 2015-07-15 LAB — CMP14+EGFR
ALT: 12 IU/L (ref 0–44)
AST: 15 IU/L (ref 0–40)
Albumin/Globulin Ratio: 1.2 (ref 1.1–2.5)
Albumin: 3.7 g/dL (ref 3.5–4.8)
Alkaline Phosphatase: 77 IU/L (ref 39–117)
BUN/Creatinine Ratio: 11 (ref 10–22)
BUN: 17 mg/dL (ref 8–27)
Bilirubin Total: 0.4 mg/dL (ref 0.0–1.2)
CO2: 20 mmol/L (ref 18–29)
Calcium: 8.3 mg/dL — ABNORMAL LOW (ref 8.6–10.2)
Chloride: 106 mmol/L (ref 97–108)
Creatinine, Ser: 1.58 mg/dL — ABNORMAL HIGH (ref 0.76–1.27)
GFR calc Af Amer: 49 mL/min/{1.73_m2} — ABNORMAL LOW (ref >59)
GFR calc non Af Amer: 42 mL/min/{1.73_m2} — ABNORMAL LOW (ref >59)
Globulin, Total: 3 g/dL (ref 1.5–4.5)
Glucose: 74 mg/dL (ref 65–99)
Potassium: 4.2 mmol/L (ref 3.5–5.2)
Sodium: 143 mmol/L (ref 134–144)
Total Protein: 6.7 g/dL (ref 6.0–8.5)

## 2015-07-15 LAB — MICROALBUMIN / CREATININE URINE RATIO
Creatinine, Urine: 154.2 mg/dL
MICROALB/CREAT RATIO: 2331.3 mg/g creat — ABNORMAL HIGH (ref 0.0–30.0)
Microalbumin, Urine: 3594.8 ug/mL

## 2015-07-15 LAB — THYROID PANEL WITH TSH
Free Thyroxine Index: 2.6 (ref 1.2–4.9)
T3 Uptake Ratio: 34 % (ref 24–39)
T4, Total: 7.6 ug/dL (ref 4.5–12.0)
TSH: 3.08 u[IU]/mL (ref 0.450–4.500)

## 2015-07-21 ENCOUNTER — Telehealth: Payer: Self-pay | Admitting: Pharmacist

## 2015-07-21 MED ORDER — IRBESARTAN 300 MG PO TABS: 300.0000 mg | ORAL_TABLET | Freq: Every day | ORAL | Status: DC

## 2015-07-21 NOTE — Telephone Encounter (Signed)
Discussed labs with patient and recommendations.  Rx for avapro sent to pharmacy - patient will finish benazepril he has left which is about 10 days.

## 2015-08-15 ENCOUNTER — Other Ambulatory Visit: Payer: Self-pay | Admitting: Family Medicine

## 2015-08-25 ENCOUNTER — Encounter: Payer: Self-pay | Admitting: Pharmacist

## 2015-08-25 ENCOUNTER — Ambulatory Visit (INDEPENDENT_AMBULATORY_CARE_PROVIDER_SITE_OTHER): Payer: Medicare Other | Admitting: Pharmacist

## 2015-08-25 VITALS — BP 132/68 | HR 62 | Ht 69.0 in | Wt 250.5 lb

## 2015-08-25 DIAGNOSIS — E1121 Type 2 diabetes mellitus with diabetic nephropathy: Secondary | ICD-10-CM | POA: Diagnosis not present

## 2015-08-25 DIAGNOSIS — E1165 Type 2 diabetes mellitus with hyperglycemia: Secondary | ICD-10-CM

## 2015-08-25 DIAGNOSIS — Z23 Encounter for immunization: Secondary | ICD-10-CM

## 2015-08-25 DIAGNOSIS — Z794 Long term (current) use of insulin: Secondary | ICD-10-CM

## 2015-08-25 DIAGNOSIS — IMO0002 Reserved for concepts with insufficient information to code with codable children: Secondary | ICD-10-CM

## 2015-08-25 DIAGNOSIS — N183 Chronic kidney disease, stage 3 unspecified: Secondary | ICD-10-CM

## 2015-08-25 DIAGNOSIS — I10 Essential (primary) hypertension: Secondary | ICD-10-CM

## 2015-08-25 NOTE — Progress Notes (Signed)
Diabetes Follow-Up Visit Chief Complaint:   Chief Complaint  Patient presents with  . Diabetes  . Hypertension     Filed Vitals:   08/25/15 0924  BP: 132/68  Pulse: 62     HPI:  Patient with long standing diabetes.   Last A1c was 7.8% from 07/14/2015.  At our last visit urine protein was elevated.  Patient was switched from lisinopril to Avapro 300mg  1 tablet daily.  He also has appt with nephrologist Dr Mercy Moore in 2-3 weeks.   Current Diabetes Medications:  Levemir 47 units daily, glimepiride 4mg  bid and Tradgenta 5mg  daily   Low fat/carbohydrate diet? Trying to limit high CHO foods.  He reports doing much better with this over the last month. Nicotine Abuse?  No Medication Compliance?  Yes Exercise?  No Alcohol Abuse?  No  Home BG Monitoring:  Checking about 4 times per week.   HBG readings = 106, 99, 96, 161, 133, 163, 121  Exam Edema:  negative   Polyuria:  negative  Polydipsia:  negative  Polyphagia:  negative  BMI:  Body mass index is 36.98 kg/(m^2).    Weight changes:  Has increase 3# since 06/2015 General Appearance:  alert, oriented, no acute distress Mood/Affect:  normal    Lab Results  Component Value Date   HGBA1C 7.8 07/14/2015    No results found for: [facility]   Lab Results  Component Value Date   CHOL 155 04/06/2015   HDL 32* 04/06/2015   LDLCALC 94 04/06/2015   TRIG 143 04/06/2015   CHOLHDL 4.8 04/06/2015      Assessment: 1.  Diabetes.  Improved compared to 1 year ago but still not at goal of A1c less than 7.0% 2.  Blood Pressure - stable.  With history of CKD I would like to see less than 130/80 3.  Lipids - patient on appropriate dose of statin 4.  Elevated microalbumin   Recommendations: 1.  Medication recommendations at this time are as follows:  Continue all current medications 2.  Reviewed HBG goals:  Fasting 80-130 and 1-2 hour post prandial <180.  Patient is instructed to check BG 1 times per day.   Advised that his medicare  plan should pay for testing supplies.  Rx was sent to pharmacy for test strips to test qd. 3.  BP goal < 130/80. 4.  LDL goal of < 100, HDL > 40 and TG < 150. 5.  Eye Exam yearly and Dental Exam every 6 months. - eye exam UTD 6.  Dietary recommendations:  Reviewed CHO limiting diet.  Also discussed DASH diet to help with BP 7.  Physical Activity recommendations:  Start exercise with 10 minutes daily and work up to 30 minutes as tolerated.  Discussed option since colder weather is coming 8.  Return to clinic 09/2015 - Dr Sabra Heck.  F/U with [facility] - March 2017 9.  Influenza vaccine giving in office today.  Time spent counseling patient:  30 minutes  Referring provider:  Gwynneth Aliment, PharmD, CPP, CDE

## 2015-08-25 NOTE — Patient Instructions (Signed)
Stay active - in winter try to walk at [facility] or Andrey Cota ro join [employer] program at [employer].   Increase non-starchy vegetables - carrots, green bean, squash, zucchini, tomatoes, onions, peppers, spinach and other green leafy vegetables, cabbage, lettuce, cucumbers, asparagus, okra (not fried), eggplant limit sugar and processed foods (cakes, cookies, ice cream, crackers and chips) Increase fresh fruit but limit serving sizes 1/2 cup or about the size of tennis or baseball limit red meat to no more than 1-2 times per week (serving size about the size of your palm) Choose whole grains / lean proteins - whole wheat bread, quinoa, whole grain rice (1/2 cup), fish, chicken, [place]   DASH Eating Plan DASH stands for "Dietary Approaches to Stop Hypertension." The DASH eating plan is a healthy eating plan that has been shown to reduce high blood pressure (hypertension). Additional health benefits may include reducing the risk of type 2 diabetes mellitus, heart disease, and stroke. The DASH eating plan may also help with weight loss. WHAT DO I NEED TO KNOW ABOUT THE DASH EATING PLAN? For the DASH eating plan, you will follow these general guidelines:  Choose foods with a percent daily value for sodium of less than 5% (as listed on the food label).  Use salt-free seasonings or herbs instead of table salt or sea salt.  Check with your health care provider or pharmacist before using salt substitutes.  Eat lower-sodium products, often labeled as "lower sodium" or "no salt added."  Eat fresh foods.  Eat more vegetables, fruits, and low-fat dairy products.  Choose whole grains. Look for the word "whole" as the first word in the ingredient list.  Choose fish and skinless chicken or [place] more often than red meat. Limit fish, poultry, and meat to 6 oz (170 g) each day.  Limit sweets, desserts, sugars, and sugary drinks.  Choose heart-healthy fats.  Limit cheese to 1 oz (28 g) per  day.  Eat more home-cooked food and less restaurant, buffet, and fast food.  Limit fried foods.  Cook foods using methods other than frying.  Limit canned vegetables. If you do use them, rinse them well to decrease the sodium.  When eating at a restaurant, ask that your food be prepared with less salt, or no salt if possible. WHAT FOODS CAN I EAT? Seek help from a dietitian for individual calorie needs. Grains Whole grain or whole wheat bread. Brown rice. Whole grain or whole wheat pasta. Quinoa, bulgur, and whole grain cereals. Low-sodium cereals. Corn or whole wheat flour tortillas. Whole grain cornbread. Whole grain crackers. Low-sodium crackers. Vegetables Fresh or frozen vegetables (raw, steamed, roasted, or grilled). Low-sodium or reduced-sodium tomato and vegetable juices. Low-sodium or reduced-sodium tomato sauce and paste. Low-sodium or reduced-sodium canned vegetables.  Fruits All fresh, canned (in natural juice), or frozen fruits. Meat and Other Protein Products Ground beef (85% or leaner), grass-fed beef, or beef trimmed of fat. Skinless chicken or [place]. Ground chicken or [place]. Pork trimmed of fat. All fish and seafood. Eggs. Dried beans, peas, or lentils. Unsalted nuts and seeds. Unsalted canned beans. Dairy Low-fat dairy products, such as skim or 1% milk, 2% or reduced-fat cheeses, low-fat ricotta or cottage cheese, or plain low-fat yogurt. Low-sodium or reduced-sodium cheeses. Fats and Oils Tub margarines without trans fats. Light or reduced-fat mayonnaise and salad dressings (reduced sodium). Avocado. Safflower, olive, or canola oils. Natural peanut or almond butter. Other Unsalted popcorn and pretzels. The items listed above may not be a complete list  of recommended foods or beverages. Contact your dietitian for more options. WHAT FOODS ARE NOT RECOMMENDED? Grains White bread. White pasta. White rice. Refined cornbread. Bagels and croissants. Crackers that contain  trans fat. Vegetables Creamed or fried vegetables. Vegetables in a cheese sauce. Regular canned vegetables. Regular canned tomato sauce and paste. Regular tomato and vegetable juices. Fruits Dried fruits. Canned fruit in light or heavy syrup. Fruit juice. Meat and Other Protein Products Fatty cuts of meat. Ribs, chicken wings, bacon, sausage, bologna, salami, chitterlings, fatback, hot dogs, bratwurst, and packaged luncheon meats. Salted nuts and seeds. Canned beans with salt. Dairy Whole or 2% milk, cream, half-and-half, and cream cheese. Whole-fat or sweetened yogurt. Full-fat cheeses or blue cheese. Nondairy creamers and whipped toppings. Processed cheese, cheese spreads, or cheese curds. Condiments Onion and garlic salt, seasoned salt, table salt, and sea salt. Canned and packaged gravies. Worcestershire sauce. Tartar sauce. Barbecue sauce. Teriyaki sauce. Soy sauce, including reduced sodium. Steak sauce. Fish sauce. Oyster sauce. Cocktail sauce. Horseradish. Ketchup and mustard. Meat flavorings and tenderizers. Bouillon cubes. Hot sauce. Tabasco sauce. Marinades. Taco seasonings. Relishes. Fats and Oils Butter, stick margarine, lard, shortening, ghee, and bacon fat. Coconut, palm kernel, or palm oils. Regular salad dressings. Other Pickles and olives. Salted popcorn and pretzels. The items listed above may not be a complete list of foods and beverages to avoid. Contact your dietitian for more information. WHERE CAN I FIND MORE INFORMATION? National Heart, Lung, and Blood Institute: travelstabloid.com   This information is not intended to replace advice given to you by your health care provider. Make sure you discuss any questions you have with your health care provider.   Document Released: 10/19/2011 Document Revised: 11/20/2014 Document Reviewed: 09/03/2013 Elsevier Interactive Patient Education [employer].

## 2015-08-28 ENCOUNTER — Other Ambulatory Visit: Payer: Self-pay | Admitting: Family Medicine

## 2015-08-30 NOTE — Telephone Encounter (Signed)
Last seen 04/06/15  Dr Sabra Heck  Last glucose 07/14/15  Requesting 90 day supply

## 2015-09-21 ENCOUNTER — Ambulatory Visit (INDEPENDENT_AMBULATORY_CARE_PROVIDER_SITE_OTHER): Payer: Medicare Other | Admitting: Family Medicine

## 2015-09-21 ENCOUNTER — Encounter: Payer: Self-pay | Admitting: Family Medicine

## 2015-09-21 VITALS — BP 140/56 | HR 74 | Temp 97.2°F | Ht 69.0 in | Wt 248.6 lb

## 2015-09-21 DIAGNOSIS — S99922A Unspecified injury of left foot, initial encounter: Secondary | ICD-10-CM | POA: Diagnosis not present

## 2015-09-21 NOTE — Progress Notes (Signed)
   HPI  Patient presents today here with foot trauma evaluation.  He explains that about 10 days ago he was working on a trailer whenever the tongue of the trailer landed on his left foot across his mid foot and toes. He had severe bruising and swelling which resolved over several days, his foot hurt for about 5-6 days but he continued using it. He states that at this time his foot has completely resolved and that he's coming in to be evaluated because his family wanted him to. He denies any foot pain that has slight tenderness whenever he squeezes across his mid foot. Sensation is intact throughout He's been using his foot normally, he mowed the grass yesterday.  PMH: Smoking status noted ROS: Per HPI  Objective: BP 140/56 mmHg  Pulse 74  Temp(Src) 97.2 F (36.2 C) (Oral)  Ht 5\' 9"  (1.753 m)  Wt 248 lb 9.6 oz (112.764 kg)  BMI 36.69 kg/m2 Gen: NAD, alert, cooperative with exam HEENT: NCAT CV: RRR, [id number] systolic murmur loudest at the R sternal border Resp: CTABL, no wheezes, non-labored Ext: 1-2+ pitting edema BL MSK: resolving bruise on toes 2-5 of L foot, slight tenderness to palpatiion across dorsal midfoot  Assessment and plan:  # Foot injury Clinically he does not have a fracture, however I did offer him an x-ray to be sure. He declines x-ray as his pain is completely resolved and he still walking as usual. Discussed reasons to return including development of foot pain, swelling, or any concerns.   Laroy Apple, MD [facility] 09/21/2015, 8:09 AM

## 2015-09-21 NOTE — Patient Instructions (Signed)
Great to meet you!  Please let us know if you develop any problems with your foot.

## 2015-10-04 DIAGNOSIS — E1129 Type 2 diabetes mellitus with other diabetic kidney complication: Secondary | ICD-10-CM | POA: Diagnosis not present

## 2015-10-04 DIAGNOSIS — E785 Hyperlipidemia, unspecified: Secondary | ICD-10-CM | POA: Diagnosis not present

## 2015-10-04 DIAGNOSIS — I129 Hypertensive chronic kidney disease with stage 1 through stage 4 chronic kidney disease, or unspecified chronic kidney disease: Secondary | ICD-10-CM | POA: Diagnosis not present

## 2015-10-04 DIAGNOSIS — N183 Chronic kidney disease, stage 3 (moderate): Secondary | ICD-10-CM | POA: Diagnosis not present

## 2015-10-06 ENCOUNTER — Other Ambulatory Visit: Payer: Self-pay | Admitting: Family Medicine

## 2015-10-12 ENCOUNTER — Encounter: Payer: Self-pay | Admitting: Family Medicine

## 2015-10-12 ENCOUNTER — Ambulatory Visit (INDEPENDENT_AMBULATORY_CARE_PROVIDER_SITE_OTHER): Payer: Medicare Other | Admitting: Family Medicine

## 2015-10-12 VITALS — BP 148/61 | HR 56 | Temp 97.7°F | Ht 69.0 in | Wt 249.8 lb

## 2015-10-12 DIAGNOSIS — E785 Hyperlipidemia, unspecified: Secondary | ICD-10-CM

## 2015-10-12 DIAGNOSIS — Z794 Long term (current) use of insulin: Secondary | ICD-10-CM | POA: Diagnosis not present

## 2015-10-12 DIAGNOSIS — N183 Chronic kidney disease, stage 3 unspecified: Secondary | ICD-10-CM

## 2015-10-12 DIAGNOSIS — E1121 Type 2 diabetes mellitus with diabetic nephropathy: Secondary | ICD-10-CM

## 2015-10-12 DIAGNOSIS — I10 Essential (primary) hypertension: Secondary | ICD-10-CM

## 2015-10-12 DIAGNOSIS — E1165 Type 2 diabetes mellitus with hyperglycemia: Secondary | ICD-10-CM | POA: Diagnosis not present

## 2015-10-12 DIAGNOSIS — IMO0002 Reserved for concepts with insufficient information to code with codable children: Secondary | ICD-10-CM

## 2015-10-12 MED ORDER — LEVOTHYROXINE SODIUM 100 MCG PO TABS: 100.0000 ug | ORAL_TABLET | Freq: Every day | ORAL | Status: DC

## 2015-10-12 MED ORDER — METOPROLOL TARTRATE 100 MG PO TABS: ORAL_TABLET | ORAL | Status: DC

## 2015-10-12 MED ORDER — IRBESARTAN 300 MG PO TABS: 300.0000 mg | ORAL_TABLET | Freq: Every day | ORAL | Status: DC

## 2015-10-12 MED ORDER — ATORVASTATIN CALCIUM 40 MG PO TABS: ORAL_TABLET | ORAL | Status: DC

## 2015-10-12 MED ORDER — AMLODIPINE BESYLATE 10 MG PO TABS: 10.0000 mg | ORAL_TABLET | Freq: Every day | ORAL | Status: DC

## 2015-10-12 MED ORDER — GLIMEPIRIDE 4 MG PO TABS: ORAL_TABLET | ORAL | Status: DC
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
## 2015-10-14 ENCOUNTER — Ambulatory Visit: Payer: Medicare Other | Admitting: Family Medicine

## 2015-10-29 ENCOUNTER — Other Ambulatory Visit: Payer: Self-pay | Admitting: Family Medicine

## 2016-01-10 ENCOUNTER — Other Ambulatory Visit (INDEPENDENT_AMBULATORY_CARE_PROVIDER_SITE_OTHER): Payer: Medicare Other

## 2016-01-10 DIAGNOSIS — N183 Chronic kidney disease, stage 3 unspecified: Secondary | ICD-10-CM

## 2016-01-10 DIAGNOSIS — Z794 Long term (current) use of insulin: Secondary | ICD-10-CM

## 2016-01-10 DIAGNOSIS — E785 Hyperlipidemia, unspecified: Secondary | ICD-10-CM | POA: Diagnosis not present

## 2016-01-10 DIAGNOSIS — E1121 Type 2 diabetes mellitus with diabetic nephropathy: Secondary | ICD-10-CM | POA: Diagnosis not present

## 2016-01-10 DIAGNOSIS — E1165 Type 2 diabetes mellitus with hyperglycemia: Secondary | ICD-10-CM

## 2016-01-10 DIAGNOSIS — I10 Essential (primary) hypertension: Secondary | ICD-10-CM

## 2016-01-10 DIAGNOSIS — IMO0002 Reserved for concepts with insufficient information to code with codable children: Secondary | ICD-10-CM

## 2016-01-10 DIAGNOSIS — E039 Hypothyroidism, unspecified: Secondary | ICD-10-CM

## 2016-01-11 ENCOUNTER — Other Ambulatory Visit: Payer: Self-pay | Admitting: *Deleted

## 2016-01-11 LAB — CMP14+EGFR
ALT: 18 IU/L (ref 0–44)
AST: 23 IU/L (ref 0–40)
Albumin/Globulin Ratio: 1.2 (ref 1.1–2.5)
Albumin: 3.5 g/dL (ref 3.5–4.8)
Alkaline Phosphatase: 87 IU/L (ref 39–117)
BUN/Creatinine Ratio: 10 (ref 10–22)
BUN: 16 mg/dL (ref 8–27)
Bilirubin Total: 0.3 mg/dL (ref 0.0–1.2)
CO2: 24 mmol/L (ref 18–29)
Calcium: 8.4 mg/dL — ABNORMAL LOW (ref 8.6–10.2)
Chloride: 103 mmol/L (ref 96–106)
Creatinine, Ser: 1.67 mg/dL — ABNORMAL HIGH (ref 0.76–1.27)
GFR calc Af Amer: 46 mL/min/{1.73_m2} — ABNORMAL LOW (ref >59)
GFR calc non Af Amer: 40 mL/min/{1.73_m2} — ABNORMAL LOW (ref >59)
Globulin, Total: 3 g/dL (ref 1.5–4.5)
Glucose: 111 mg/dL — ABNORMAL HIGH (ref 65–99)
Potassium: 4.3 mmol/L (ref 3.5–5.2)
Sodium: 140 mmol/L (ref 134–144)
Total Protein: 6.5 g/dL (ref 6.0–8.5)

## 2016-01-11 LAB — LIPID PANEL
Chol/HDL Ratio: 5.4 ratio units — ABNORMAL HIGH (ref 0.0–5.0)
Cholesterol, Total: 167 mg/dL (ref 100–199)
HDL: 31 mg/dL — ABNORMAL LOW (ref >39)
LDL Calculated: 106 mg/dL — ABNORMAL HIGH (ref 0–99)
Triglycerides: 149 mg/dL (ref 0–149)
VLDL Cholesterol Cal: 30 mg/dL (ref 5–40)

## 2016-01-11 LAB — TSH: TSH: 8.3 u[IU]/mL — ABNORMAL HIGH (ref 0.450–4.500)

## 2016-01-11 MED ORDER — LEVOTHYROXINE SODIUM 125 MCG PO TABS: 125.0000 ug | ORAL_TABLET | Freq: Every day | ORAL | Status: DC

## 2016-01-13 ENCOUNTER — Ambulatory Visit (INDEPENDENT_AMBULATORY_CARE_PROVIDER_SITE_OTHER): Payer: Medicare Other | Admitting: Pharmacist

## 2016-01-13 ENCOUNTER — Encounter: Payer: Self-pay | Admitting: Pharmacist

## 2016-01-13 VITALS — BP 148/60 | HR 68 | Ht 69.5 in | Wt 251.0 lb

## 2016-01-13 DIAGNOSIS — E1121 Type 2 diabetes mellitus with diabetic nephropathy: Secondary | ICD-10-CM | POA: Diagnosis not present

## 2016-01-13 DIAGNOSIS — Z794 Long term (current) use of insulin: Secondary | ICD-10-CM | POA: Diagnosis not present

## 2016-01-13 LAB — POCT GLYCOSYLATED HEMOGLOBIN (HGB A1C): Hemoglobin A1C: 9

## 2016-01-13 NOTE — Progress Notes (Signed)
Diabetes Follow-Up Visit Chief Complaint:   Chief Complaint  Patient presents with  . Diabetes  . Hypertension     Filed Vitals:   01/13/16 0844  BP: 148/60  Pulse: 68    HPI:  Patient with long standing diabetes.   Last A1c was 7.8% from 07/14/2015 but today has increased to 9.0%  Current Diabetes Medications:  Levemir 47 units daily, glimepiride 4mg  bid and Tradjenta 5mg  daily Patient received Levemir from [facility] Department Patient Assistance Program and has several vials currently  Low fat/carbohydrate diet? Tries to limit CHO but eats out a lot and has bread a lot. Nicotine Abuse?  No Medication Compliance?  Yes Exercise?  No Alcohol Abuse?  No  Home BG Monitoring:  Checking about 3 times per week.   HBG readings (per pt - did not bring glucometer) - 105, 135.  Always checked am fasting.  Exam Edema:  negative   Polyuria:  negative  Polydipsia:  negative  Polyphagia:  negative  BMI:  Body mass index is 36.55 kg/(m^2).    Weight changes:  Has increase 2# since 09/2015 General Appearance:  alert, oriented, no acute distress Mood/Affect:  normal    Lab Results  Component Value Date   HGBA1C 9.0 01/13/2016    No results found for: [facility]   Lab Results  Component Value Date   CHOL 167 01/10/2016   HDL 31* 01/10/2016   LDLCALC 106* 01/10/2016   TRIG 149 01/10/2016   CHOLHDL 5.4* 01/10/2016      Assessment: 1.  Diabetes - A1c has increased 2.  Blood Pressure - stable but not at goal with history of CKD I would like to see less than 130/80 3.  Lipids - LDL has increased possibly related to elevated TSH / hypothyroidism  4.  Hypothyroidism with most recent labs showing elevated TSH    Recommendations: 1.  Medication recommendations at this time are as follows:    Increase Levemir to 55 units once a day  Reviewed proper injection administration and site selection 2.  Reviewed HBG goals:  Fasting 80-130 and 1-2 hour post prandial <180.   Patient is instructed to check BG 1 time per day.  Also recommended that he check at varying times of day and record. Record sheet given.  4.  LDL goal of < 100, HDL > 40 and TG < 150. 5.  Eye Exam yearly and Dental Exam every 6 months. - eye exam UTD 6.  Dietary recommendations: Discussed lower CHO options when eating out.  Also discussed that restaurant foods generally has more sodium than what you make at home and encouraged decreased eating out.  7.  Physical Activity recommendations:  Start exercise with 10 minutes daily and work up to 30 minutes as tolerated.   8.  Return to clinic 03/2016 - Dr Sabra Heck.  F/U with me/ CDE - 02/2016  Time spent counseling patient:  30 minutes  Referring provider:  Gwynneth Aliment, PharmD, CPP, CDE

## 2016-02-08 DIAGNOSIS — N2581 Secondary hyperparathyroidism of renal origin: Secondary | ICD-10-CM | POA: Diagnosis not present

## 2016-02-08 DIAGNOSIS — N183 Chronic kidney disease, stage 3 (moderate): Secondary | ICD-10-CM | POA: Diagnosis not present

## 2016-02-28 ENCOUNTER — Encounter: Payer: Self-pay | Admitting: Pharmacist

## 2016-02-28 ENCOUNTER — Ambulatory Visit (INDEPENDENT_AMBULATORY_CARE_PROVIDER_SITE_OTHER): Payer: Medicare Other | Admitting: Pharmacist

## 2016-02-28 VITALS — BP 130/62 | HR 67 | Ht 70.0 in | Wt 249.5 lb

## 2016-02-28 DIAGNOSIS — Z794 Long term (current) use of insulin: Secondary | ICD-10-CM

## 2016-02-28 DIAGNOSIS — N183 Chronic kidney disease, stage 3 unspecified: Secondary | ICD-10-CM

## 2016-02-28 DIAGNOSIS — E1122 Type 2 diabetes mellitus with diabetic chronic kidney disease: Secondary | ICD-10-CM

## 2016-02-28 DIAGNOSIS — E669 Obesity, unspecified: Secondary | ICD-10-CM | POA: Diagnosis not present

## 2016-02-28 DIAGNOSIS — E785 Hyperlipidemia, unspecified: Secondary | ICD-10-CM

## 2016-02-28 DIAGNOSIS — I10 Essential (primary) hypertension: Secondary | ICD-10-CM | POA: Diagnosis not present

## 2016-02-28 MED ORDER — GLUCOSE BLOOD VI STRP: ORAL_STRIP | Status: DC

## 2016-02-28 NOTE — Progress Notes (Signed)
Diabetes Follow-Up Visit Chief Complaint:   Chief Complaint  Patient presents with  . Diabetes     Filed Vitals:   02/28/16 1027  BP: 130/62  Pulse: 67    HPI:  Patient with long standing diabetes here today for follow up uncontrolled type 2 DM with need of insulin therapy.   A1c was 7.8% from 07/14/2015 but at last visit in March 2017 had increased to 9.0%.  Insulin was increased and diet reviewed.   Current Diabetes Medications:  Levemir 55 units daily, glimepiride 4mg  bid and Tradjenta 5mg  daily Patient received Levemir from [facility] Department Patient Assistance Program and still has several vials currently.  Low fat/carbohydrate diet? Has been making better food choices to limit CHO intake and better control BG. Eating out less.  Nicotine Abuse?  No Medication Compliance?  Yes Exercise?  Yes - is waking usually about 10 minutes 3 days per week Alcohol Abuse?  No  Home BG Monitoring:  Checking about 5 times per week.   HBG readings: 87 - 110 ( Always checked am fasting)  Exam Edema:  negative   Polyuria:  negative  Polydipsia:  negative  Polyphagia:  negative  BMI:  Body mass index is 35.8 kg/(m^2).    Weight changes:  Has decreased about 2# since 01/2015 General Appearance:  alert, oriented, no acute distress Mood/Affect:  normal    Lab Results  Component Value Date   HGBA1C 9.0 01/13/2016    No results found for: [facility]   Lab Results  Component Value Date   CHOL 167 01/10/2016   HDL 31* 01/10/2016   LDLCALC 106* 01/10/2016   TRIG 149 01/10/2016   CHOLHDL 5.4* 01/10/2016      Assessment: 1.  Diabetes - most recent HBG readings show improved control - A1c due next month 2.  Blood Pressure - at goal today 3.  Lipids - LDL has increased possibly related to elevated TSH / hypothyroidism  4.  Hypothyroidism with most recent labs showing elevated TSH - due recheck at next appt with PCP 5.  Obesity - weight is trending down with recent  dietary and physical activity changes.    Recommendations: 1.  Medication recommendations at this time are as follows:    Continue Levemir to 55 units once a day  Continue all other medications at this time 2.  Reviewed HBG goals:  Fasting 80-130 and 1-2 hour post prandial <180.  Patient is instructed to check BG 1 time per day.  Again reminded patient to check BG at varying times of day and record. Also gave new glucometer - One Touch Verio and Rx for testing supplies. 4.  LDL goal of < 100, HDL > 40 and TG < 150. 5.  Eye Exam yearly and Dental Exam every 6 months. - eye exam UTD 6.  Dietary recommendations: Continue to limit CHO and salt intake and to continue to limit amount of times per week that he eats out.   7.  Physical Activity recommendations:  Increase exercise with 15 minutes daily and work up to 30 minutes as tolerated.   8.  Return to clinic 03/2016 - Dr Sabra Heck.  F/U with me for AWV 04/2016  Time spent counseling patient:  30 minutes  Referring provider:  Gwynneth Aliment, PharmD, CPP, CDE

## 2016-02-28 NOTE — Patient Instructions (Signed)
Check blood glucose once daily - try to get some reading in evening and after meal.   DASH Eating Plan DASH stands for "Dietary Approaches to Stop Hypertension." The DASH eating plan is a healthy eating plan that has been shown to reduce high blood pressure (hypertension). Additional health benefits may include reducing the risk of type 2 diabetes mellitus, heart disease, and stroke. The DASH eating plan may also help with weight loss. WHAT DO I NEED TO KNOW ABOUT THE DASH EATING PLAN? For the DASH eating plan, you will follow these general guidelines:  Choose foods with a percent daily value for sodium of less than 5% (as listed on the food label).  Use salt-free seasonings or herbs instead of table salt or sea salt.  Check with your health care provider or pharmacist before using salt substitutes.  Eat lower-sodium products, often labeled as "lower sodium" or "no salt added."  Eat fresh foods.  Eat more vegetables, fruits, and low-fat dairy products.  Choose whole grains. Look for the word "whole" as the first word in the ingredient list.  Choose fish and skinless chicken or [place] more often than red meat. Limit fish, poultry, and meat to 6 oz (170 g) each day.  Limit sweets, desserts, sugars, and sugary drinks.  Choose heart-healthy fats.  Limit cheese to 1 oz (28 g) per day.  Eat more home-cooked food and less restaurant, buffet, and fast food.  Limit fried foods.  Cook foods using methods other than frying.  Limit canned vegetables. If you do use them, rinse them well to decrease the sodium.  When eating at a restaurant, ask that your food be prepared with less salt, or no salt if possible. WHAT FOODS CAN I EAT? Seek help from a dietitian for individual calorie needs. Grains Whole grain or whole wheat bread. Brown rice. Whole grain or whole wheat pasta. Quinoa, bulgur, and whole grain cereals. Low-sodium cereals. Corn or whole wheat flour tortillas. Whole grain  cornbread. Whole grain crackers. Low-sodium crackers. Vegetables Fresh or frozen vegetables (raw, steamed, roasted, or grilled). Low-sodium or reduced-sodium tomato and vegetable juices. Low-sodium or reduced-sodium tomato sauce and paste. Low-sodium or reduced-sodium canned vegetables.  Fruits All fresh, canned (in natural juice), or frozen fruits. Meat and Other Protein Products Ground beef (85% or leaner), grass-fed beef, or beef trimmed of fat. Skinless chicken or [place]. Ground chicken or [place]. Pork trimmed of fat. All fish and seafood. Eggs. Dried beans, peas, or lentils. Unsalted nuts and seeds. Unsalted canned beans. Dairy Low-fat dairy products, such as skim or 1% milk, 2% or reduced-fat cheeses, low-fat ricotta or cottage cheese, or plain low-fat yogurt. Low-sodium or reduced-sodium cheeses. Fats and Oils Tub margarines without trans fats. Light or reduced-fat mayonnaise and salad dressings (reduced sodium). Avocado. Safflower, olive, or canola oils. Natural peanut or almond butter. Other Unsalted popcorn and pretzels. The items listed above may not be a complete list of recommended foods or beverages. Contact your dietitian for more options. WHAT FOODS ARE NOT RECOMMENDED? Grains White bread. White pasta. White rice. Refined cornbread. Bagels and croissants. Crackers that contain trans fat. Vegetables Creamed or fried vegetables. Vegetables in a cheese sauce. Regular canned vegetables. Regular canned tomato sauce and paste. Regular tomato and vegetable juices. Fruits Dried fruits. Canned fruit in light or heavy syrup. Fruit juice. Meat and Other Protein Products Fatty cuts of meat. Ribs, chicken wings, bacon, sausage, bologna, salami, chitterlings, fatback, hot dogs, bratwurst, and packaged luncheon meats. Salted nuts and seeds. Canned  beans with salt. Dairy Whole or 2% milk, cream, half-and-half, and cream cheese. Whole-fat or sweetened yogurt. Full-fat cheeses or blue cheese.  Nondairy creamers and whipped toppings. Processed cheese, cheese spreads, or cheese curds. Condiments Onion and garlic salt, seasoned salt, table salt, and sea salt. Canned and packaged gravies. Worcestershire sauce. Tartar sauce. Barbecue sauce. Teriyaki sauce. Soy sauce, including reduced sodium. Steak sauce. Fish sauce. Oyster sauce. Cocktail sauce. Horseradish. Ketchup and mustard. Meat flavorings and tenderizers. Bouillon cubes. Hot sauce. Tabasco sauce. Marinades. Taco seasonings. Relishes. Fats and Oils Butter, stick margarine, lard, shortening, ghee, and bacon fat. Coconut, palm kernel, or palm oils. Regular salad dressings. Other Pickles and olives. Salted popcorn and pretzels. The items listed above may not be a complete list of foods and beverages to avoid. Contact your dietitian for more information. WHERE CAN I FIND MORE INFORMATION? National Heart, Lung, and Blood Institute: travelstabloid.com   This information is not intended to replace advice given to you by your health care provider. Make sure you discuss any questions you have with your health care provider.   Document Released: 10/19/2011 Document Revised: 11/20/2014 Document Reviewed: 09/03/2013 Elsevier Interactive Patient Education [employer].

## 2016-03-17 DIAGNOSIS — I129 Hypertensive chronic kidney disease with stage 1 through stage 4 chronic kidney disease, or unspecified chronic kidney disease: Secondary | ICD-10-CM | POA: Diagnosis not present

## 2016-03-17 DIAGNOSIS — E785 Hyperlipidemia, unspecified: Secondary | ICD-10-CM | POA: Diagnosis not present

## 2016-03-17 DIAGNOSIS — N183 Chronic kidney disease, stage 3 (moderate): Secondary | ICD-10-CM | POA: Diagnosis not present

## 2016-03-17 DIAGNOSIS — E1129 Type 2 diabetes mellitus with other diabetic kidney complication: Secondary | ICD-10-CM | POA: Diagnosis not present

## 2016-03-27 ENCOUNTER — Other Ambulatory Visit: Payer: Self-pay | Admitting: *Deleted

## 2016-03-27 MED ORDER — LINAGLIPTIN 5 MG PO TABS: 5.0000 mg | ORAL_TABLET | Freq: Every day | ORAL | Status: DC

## 2016-04-07 ENCOUNTER — Other Ambulatory Visit: Payer: Self-pay | Admitting: Family Medicine

## 2016-04-11 ENCOUNTER — Ambulatory Visit (INDEPENDENT_AMBULATORY_CARE_PROVIDER_SITE_OTHER): Payer: Medicare Other | Admitting: Family Medicine

## 2016-04-11 ENCOUNTER — Encounter: Payer: Self-pay | Admitting: Family Medicine

## 2016-04-11 VITALS — BP 126/55 | HR 63 | Temp 97.6°F | Ht 70.0 in | Wt 256.0 lb

## 2016-04-11 DIAGNOSIS — E785 Hyperlipidemia, unspecified: Secondary | ICD-10-CM

## 2016-04-11 DIAGNOSIS — E1122 Type 2 diabetes mellitus with diabetic chronic kidney disease: Secondary | ICD-10-CM | POA: Diagnosis not present

## 2016-04-11 DIAGNOSIS — Z794 Long term (current) use of insulin: Secondary | ICD-10-CM

## 2016-04-11 DIAGNOSIS — N183 Chronic kidney disease, stage 3 unspecified: Secondary | ICD-10-CM

## 2016-04-11 DIAGNOSIS — R5383 Other fatigue: Secondary | ICD-10-CM

## 2016-04-11 DIAGNOSIS — I10 Essential (primary) hypertension: Secondary | ICD-10-CM | POA: Diagnosis not present

## 2016-04-11 DIAGNOSIS — E039 Hypothyroidism, unspecified: Secondary | ICD-10-CM

## 2016-04-11 NOTE — Progress Notes (Signed)
Subjective:    Patient ID: Michael Trevino, male    DOB: [birth year], 75 y.o.   MRN: [id number]  HPI Pt here for follow up and management of chronic medical problems which includes diabetes, hyperlipidemia, and hypertension. He is taking medications regularly.  His concern today is fatigue. This is a new symptom since he saw a nephrologist who gave him another prescription for diltiazem and he has been taking a new prescription and is all 1 which is probably too much. We contacted the nephrologist office to tell them that we would reduce it to only the 360 mg dose. He denies chest pain. Also he [facility] [place] that they gave him 100 g instead of the 125. He had been increased to 125 after the last TSH was elevated in February. He is seen by Tammy for his diabetes. Last A1c was 9.0 but it is a few days too early to check it. It should be checked at his next visit next month.    Patient Active Problem List   Diagnosis Date Noted  . Knee pain, right 03/09/2014  . Hip pain, right 03/09/2014  . Hypertension   . MI (myocardial infarction) ([place])   . Arthritis   . Erectile dysfunction 04/11/2013  . HLD (hyperlipidemia) 04/11/2013  . UTI (urinary tract infection) 02/25/2013  . Sepsis due to Streptococcus, group B ([place]) 02/24/2013  . Bacteremia due to group B Streptococcus 02/24/2013  . Rash 11/06/2012  . Fever 11/06/2012  . Cellulitis 11/05/2012  . DM (diabetes mellitus), type 2, uncontrolled, with renal complications ([place]) [id number]  . HTN (hypertension) 02/08/2011  . Hyperlipidemia 02/08/2011  . Hypothyroid 02/08/2011  . CKD (chronic kidney disease) stage 3, GFR 30-59 ml/min 02/08/2011  . Illiterate 02/08/2011  . Impotence 02/08/2011  . CAD (coronary artery disease) 02/08/2011  . 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 amLODipine (NORVASC) 10 MG tablet Take 1 tablet (10 mg total) by mouth  daily.  Marland Kitchen ascorbic acid (VITAMIN C) 500 MG tablet Take 500 mg by mouth daily.  Marland Kitchen aspirin 325 MG EC tablet Take 325 mg by mouth daily.  Marland Kitchen atorvastatin (LIPITOR) 40 MG tablet TAKE 1 TABLET (40 MG TOTAL) BY MOUTH DAILY.  Marland Kitchen B-D UF III MINI PEN NEEDLES 31G X 5 MM MISC USE DAILY  . cholecalciferol (VITAMIN D) 1000 UNITS tablet Take 1,000 Units by mouth daily.  Marland Kitchen diltiazem (CARDIZEM CD) 360 MG 24 hr capsule Take 360 mg by mouth daily.  . furosemide (LASIX) 40 MG tablet Take 1 tablet (40 mg total) by mouth 2 (two) times daily.  Marland Kitchen glimepiride (AMARYL) 4 MG tablet TAKE 1 TABLET (4 MG TOTAL) BY MOUTH 2 (TWO) TIMES DAILY.  Marland Kitchen glucose blood test strip Use to check blood glucose once daily.  Dx:  E11.9  . glucose blood test strip Use to check blood glucose daily. Dx:  Type 2 diabetes, requiring insulin (E11.22, N18.3, Z79.4)  . Insulin Detemir (LEVEMIR FLEXPEN) 100 UNIT/ML Pen Inject 55 Units into the skin daily.  . Insulin Syringe-Needle U-100 (BD INSULIN SYRINGE ULTRAFINE) 31G X 5/16" 1 ML MISC Use to inject insulin daily.  Dx:  250.02  . irbesartan (AVAPRO) 300 MG tablet Take 1 tablet (300 mg total) by mouth daily.  Marland Kitchen levothyroxine (SYNTHROID, LEVOTHROID) 100 MCG tablet Take 100 mcg by mouth daily before breakfast.  . linagliptin (TRADJENTA) 5 MG TABS tablet Take 1 tablet (5  mg total) by mouth daily.  . metoprolol (LOPRESSOR) 100 MG tablet TAKE 1 TABLET (100 MG TOTAL) BY MOUTH 2 (TWO) TIMES DAILY.  . [DISCONTINUED] levothyroxine (SYNTHROID, LEVOTHROID) 125 MCG tablet Take 1 tablet (125 mcg total) by mouth daily.   No facility-administered encounter medications on file as of 04/11/2016.      Review of Systems  Constitutional: Positive for fatigue.  HENT: Negative.   Eyes: Negative.   Respiratory: Negative.   Cardiovascular: Negative.   Gastrointestinal: Negative.   Endocrine: Negative.   Genitourinary: Negative.   Musculoskeletal: Negative.   Skin: Negative.   Allergic/Immunologic: Negative.     Neurological: Negative.   Hematological: Negative.   Psychiatric/Behavioral: Negative.        Objective:   Physical Exam  Constitutional: He is oriented to person, place, and time. He appears well-developed and well-nourished.  Cardiovascular: Normal rate and regular rhythm.   Pulmonary/Chest: Effort normal and breath sounds normal.  Neurological: He is alert and oriented to person, place, and time.  Psychiatric: He has a normal mood and affect. His behavior is normal.   BP 126/55 mmHg  Pulse 63  Temp(Src) 97.6 F (36.4 C) (Oral)  Ht 5\' 10"  (1.778 m)  Wt 256 lb (116.121 kg)  BMI 36.73 kg/m2        Assessment & Plan:  1. Type 2 diabetes mellitus with stage 3 chronic kidney disease, with long-term current use of insulin (HCC) Fatigue and tiredness could be related to heart. I would like to have him see cardiologist in the hopes that he may fill neck was appropriate - Ambulatory referral to Cardiology  2. Essential hypertension But will decrease his diltiazem from 600+ to 300 Pressure is good today - Ambulatory referral to Cardiology  3. Hyperlipidemia Lipids were not quite at goal with checked in February but close - Ambulatory referral to Cardiology  4. Hypothyroidism, unspecified hypothyroidism type I think he is on a low dose of Synthroid need to check TSH again today - TSH  5. Other fatigue Reduce of diltiazem but echocardiogram and cardiac evaluation I think would be appropriate given his symptoms and history - Ambulatory referral to Cardiology  Wardell Honour MD

## 2016-04-11 NOTE — Telephone Encounter (Signed)
Last seen 10/12/15  Dr Sabra Heck  Requesting 90 day supplies

## 2016-04-11 NOTE — Patient Instructions (Signed)
Medicare Annual Wellness Visit  [place] and the medical providers at [facility] strive to bring you the best medical care.  In doing so we not only want to address your current medical conditions and concerns but also to detect new conditions early and prevent illness, disease and health-related problems.    Medicare offers a yearly Wellness Visit which allows our clinical staff to assess your need for preventative services including immunizations, lifestyle education, counseling to decrease risk of preventable diseases and screening for fall risk and other medical concerns.    This visit is provided free of charge (no copay) for all Medicare recipients. The clinical pharmacists at [facility] have begun to conduct these Wellness Visits which will also include a thorough review of all your medications.    As you primary medical provider recommend that you make an appointment for your Annual Wellness Visit if you have not done so already this year.  You may set up this appointment before you leave today or you may call back ([phone]) and schedule an appointment.  Please make sure when you call that you mention that you are scheduling your Annual Wellness Visit with the clinical pharmacist so that the appointment may be made for the proper length of time.     Continue current medications. Continue good therapeutic lifestyle changes which include good diet and exercise. Fall precautions discussed with patient. If an FOBT was given today- please return it to our front desk. If you are over 50 years old - you may need Prevnar 13 or the adult Pneumonia vaccine.  **Flu shots are available--- please call and schedule a FLU-CLINIC appointment**  After your visit with us today you will receive a survey in the mail or online from [employer] regarding your care with us. Please take a moment to fill this out. Your feedback is very  important to us as you can help us better understand your patient needs as well as improve your experience and satisfaction. WE CARE ABOUT YOU!!!    

## 2016-04-12 LAB — TSH: TSH: 7.36 u[IU]/mL — ABNORMAL HIGH (ref 0.450–4.500)

## 2016-04-12 MED ORDER — LEVOTHYROXINE SODIUM 25 MCG PO TABS: 25.0000 ug | ORAL_TABLET | Freq: Every day | ORAL | Status: DC

## 2016-04-12 NOTE — Addendum Note (Signed)
Addended by: Nigel Berthold C on: 04/12/2016 09:29 AM   Modules accepted: Orders

## 2016-04-27 ENCOUNTER — Encounter: Payer: Self-pay | Admitting: Pharmacist

## 2016-04-27 ENCOUNTER — Ambulatory Visit (INDEPENDENT_AMBULATORY_CARE_PROVIDER_SITE_OTHER): Payer: Medicare Other | Admitting: Pharmacist

## 2016-04-27 VITALS — BP 134/70 | HR 72 | Ht 70.0 in | Wt 255.5 lb

## 2016-04-27 DIAGNOSIS — N183 Chronic kidney disease, stage 3 (moderate): Secondary | ICD-10-CM | POA: Diagnosis not present

## 2016-04-27 DIAGNOSIS — Z794 Long term (current) use of insulin: Secondary | ICD-10-CM | POA: Diagnosis not present

## 2016-04-27 DIAGNOSIS — I10 Essential (primary) hypertension: Secondary | ICD-10-CM

## 2016-04-27 DIAGNOSIS — Z0000 Encounter for general adult medical examination without abnormal findings: Secondary | ICD-10-CM

## 2016-04-27 DIAGNOSIS — E1122 Type 2 diabetes mellitus with diabetic chronic kidney disease: Secondary | ICD-10-CM | POA: Diagnosis not present

## 2016-04-27 DIAGNOSIS — E785 Hyperlipidemia, unspecified: Secondary | ICD-10-CM | POA: Diagnosis not present

## 2016-04-27 LAB — BAYER DCA HB A1C WAIVED: HB A1C (BAYER DCA - WAIVED): 7.8 % — ABNORMAL HIGH (ref <7.0)

## 2016-04-27 MED ORDER — LINAGLIPTIN 5 MG PO TABS: 5.0000 mg | ORAL_TABLET | Freq: Every day | ORAL | Status: DC

## 2016-04-27 NOTE — Patient Instructions (Addendum)
  Michael Trevino , Thank you for taking time to come for your Medicare Wellness Visit. I appreciate your ongoing commitment to your health goals. Please review the following plan we discussed and let me know if I can assist you in the future.    Stop diltiazem  Continue amlodipine 10mg  1 tablet daily  Continue levothyroxine 162mcg + 57mcg 1 tablet of each daily  Change Levemir from evening to morning dosing 55 units once each morning  Try to eat 3 small meals a day and don't wait until you are "starving".  You can have 1 or 2 small snacks too.  Remember to drink lots of water especially when working out side.   Remember to be active every day - walking or other exercise is good - goal is 150 minutes of physical activity per week   Increase non-starchy vegetables - carrots, green bean, squash, zucchini, tomatoes, onions, peppers, spinach and other green leafy vegetables, cabbage, lettuce, cucumbers, asparagus, okra (not fried), eggplant Limit sugar and processed foods (cakes, cookies, ice cream, crackers and chips) Increase fresh fruit but limit serving sizes 1/2 cup or about the size of tennis or baseball Limit red meat to no more than 1-2 times per week (serving size about the size of your palm) Choose whole grains / lean proteins - whole wheat bread, quinoa, whole grain rice (1/2 cup), fish, chicken, [place] Avoid sugar and calorie containing beverages - soda, sweet tea and juice.  Choose water or unsweetened tea instead.     This is a list of the screening recommended for you and due dates:  Health Maintenance  Topic Date Due  . Tetanus Vaccine  11/11/2016*  . Shingles Vaccine  12/12/2016*  . Flu Shot  06/13/2016  . Eye exam for diabetics  06/29/2016  . Hemoglobin A1C  07/15/2016  . Complete foot exam   04/11/2017  . Colon Cancer Screening  01/31/2024  . Pneumonia vaccines  Completed  *Topic was postponed. The date shown is not the original due date.

## 2016-04-27 NOTE — Progress Notes (Signed)
Patient ID: Michael Trevino, male   DOB: 09-12-1941, 75 y.o.   MRN: [id number]    Subjective:   Michael Trevino is a 75 y.o. male who presents for a subsequent Medicare Annual Wellness Visit, recheck diabetes, hyperlipidemia and review medications.  Review of Systems  Review of Systems  Constitutional: Negative.   HENT: Negative.   Eyes: Negative.   Respiratory: Negative.   Cardiovascular: Negative.   Gastrointestinal: Negative.   Genitourinary: Negative.   Musculoskeletal: Positive for joint pain.  Skin: Negative.   Neurological: Negative.  Negative for weakness (improved over last 4 weeks).  Endo/Heme/Allergies: Negative.   Psychiatric/Behavioral: Negative.        Current Medications (verified) Outpatient Encounter Prescriptions as of 04/27/2016  Medication Sig  . amLODipine (NORVASC) 10 MG tablet Take 10 mg by mouth daily.  Marland Kitchen ascorbic acid (VITAMIN C) 500 MG tablet Take 500 mg by mouth daily.  Marland Kitchen aspirin 325 MG EC tablet Take 325 mg by mouth daily.  Marland Kitchen atorvastatin (LIPITOR) 40 MG tablet TAKE 1 TABLET (40 MG TOTAL) BY MOUTH DAILY.  Marland Kitchen B-D UF III MINI PEN NEEDLES 31G X 5 MM MISC USE DAILY  . cholecalciferol (VITAMIN D) 1000 UNITS tablet Take 1,000 Units by mouth daily.  Marland Kitchen diltiazem (CARDIZEM CD) 360 MG 24 hr capsule Take 360 mg by mouth daily.  . furosemide (LASIX) 40 MG tablet Take 1 tablet (40 mg total) by mouth 2 (two) times daily.  Marland Kitchen glimepiride (AMARYL) 4 MG tablet TAKE 1 TABLET (4 MG TOTAL) BY MOUTH 2 (TWO) TIMES DAILY.  Marland Kitchen glucose blood test strip Use to check blood glucose once daily.  Dx:  E11.9  . Insulin Detemir (LEVEMIR FLEXPEN) 100 UNIT/ML Pen Inject 55 Units into the skin daily.  . Insulin Syringe-Needle U-100 (BD INSULIN SYRINGE ULTRAFINE) 31G X 5/16" 1 ML MISC Use to inject insulin daily.  Dx:  250.02  . irbesartan (AVAPRO) 300 MG tablet TAKE 1 TABLET (300 MG TOTAL) BY MOUTH DAILY.  Marland Kitchen levothyroxine (SYNTHROID, LEVOTHROID) 100 MCG tablet Take 100 mcg by mouth daily before  breakfast.  . linagliptin (TRADJENTA) 5 MG TABS tablet Take 1 tablet (5 mg total) by mouth daily.  . metoprolol (LOPRESSOR) 100 MG tablet TAKE 1 TABLET (100 MG TOTAL) BY MOUTH 2 (TWO) TIMES DAILY.  . [DISCONTINUED] glucose blood test strip Use to check blood glucose daily. Dx:  Type 2 diabetes, requiring insulin (E11.22, N18.3, Z79.4)  . levothyroxine (LEVOTHROID) 25 MCG tablet Take 1 tablet (25 mcg total) by mouth daily before breakfast. (Patient not taking: Reported on 04/27/2016)  . [DISCONTINUED] levothyroxine (SYNTHROID, LEVOTHROID) 100 MCG tablet TAKE 1 TABLET (100 MCG TOTAL) BY MOUTH DAILY BEFORE BREAKFAST.   No facility-administered encounter medications on file as of 04/27/2016.    Allergies (verified) Zocor   History: Past Medical History  Diagnosis Date  . Hyperlipidemia   . Hypertension   . MI (myocardial infarction) ([place]) 2000  . Diabetes mellitus without complication ([place])   . Arthritis   . Sepsis ([place])   . Thyroid disease     hypothyroid  . Renal disorder    Past Surgical History  Procedure Laterality Date  . Tee without cardioversion N/A 02/26/2013    Procedure: TRANSESOPHAGEAL ECHOCARDIOGRAM (TEE);  Surgeon: Thayer Headings, MD;  Location: [facility] ENDOSCOPY;  Service: Cardiovascular;  Laterality: N/A;   Family History  Problem Relation Age of Onset  . Diabetes Mother   . Hypertension Mother   . Cancer Father   . Stroke Brother   .  Alcohol abuse Brother   . Diabetes Brother   . Diabetes Sister   . Diabetes Brother 13    TYPE 1  . Kidney disease Brother 46    DIALYSIS   Social History   Occupational History  . Not on file.   Social History Main Topics  . Smoking status: Former Smoker -- 0.50 packs/day for 30 years    Types: Cigarettes    Quit date: 11/05/1984  . Smokeless tobacco: Never Used  . Alcohol Use: No  . Drug Use: No  . Sexual Activity: Yes    Do you feel safe at home?  Yes Are there smokers in your home (other than you)? 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  He tries to waiting until he is really hungry.  C/o getting shaky sometimes    Cardiac Risk Factors include: advanced age (>40men, >14 women);diabetes mellitus;obesity (BMI >30kg/m2);dyslipidemia;family history of premature cardiovascular disease;hypertension;male gender;microalbuminuria  Objective:    Today's Vitals   04/27/16 0959  BP: 134/70  Pulse: 72  Height: 5\' 10"  (1.778 m)  Weight: 255 lb 8 oz (115.894 kg)  PainSc: 0-No pain   Body mass index is 36.66 kg/(m^2).   A1c = 7.8% today (decreased from 9.0% 01/13/2016)   Activities of Daily Living In your present state of health, do you have any difficulty performing the following activities: 04/27/2016  Hearing? N  Vision? N  Difficulty concentrating or making decisions? N  Walking or climbing stairs? N  Dressing or bathing? N  Doing errands, shopping? N  Preparing Food and eating ? N  Using the Toilet? N  In the past six months, have you accidently leaked urine? N  Do you have problems with loss of bowel control? N  Managing your Medications? Y  Managing your Finances? N  Housekeeping or managing your Housekeeping? N     Depression Screen PHQ 2/9 Scores 04/27/2016 04/11/2016 04/16/2015 10/07/2014  PHQ - 2 Score 0 0 0 0     Fall Risk Fall Risk  04/11/2016 04/16/2015 10/07/2014 03/09/2014 10/30/2013  Falls in the past year? No No No No No    Cognitive Function: MMSE - Mini Mental State Exam 04/16/2015  Orientation to time 5  Orientation to Place 5  Registration 3  Attention/ Calculation 4  Recall 3  Language- name 2 objects 2  Language- repeat 1  Language- follow 3 step command 3  Language- read & follow direction 1  Write a sentence 1  Copy design 1  Total score 29     Immunizations and Health Maintenance Immunization History  Administered Date(s) Administered  . Influenza Whole 08/25/2008  . Influenza,inj,Quad PF,36+ Mos 08/19/2014, 08/25/2015  . Pneumococcal Conjugate-13 04/06/2015  . Pneumococcal Polysaccharide-23 10/06/2005, 12/12/2007   There are no preventive care reminders to display for this patient.  Patient Care Team: Wardell Honour, MD as PCP - General (Family Medicine) Truc Manus Gunning, OD as Consulting Physician (Optometry) Fleet Contras, MD as Consulting Physician (Nephrology)  Indicate any recent Medical Services you may have received from other than [facility] providers in the past year (date may be approximate).    Assessment:    Annual Wellness Visit  Medication management - taking 2 CCB Hypothyroidism Diabetes - A1c has greatly improved   Screening Tests Health  Maintenance  Topic Date Due  . TETANUS/TDAP  11/11/2016 (Originally 02/08/1960)  . ZOSTAVAX  12/12/2016 (Originally 02/07/2001)  . INFLUENZA VACCINE  06/13/2016  . OPHTHALMOLOGY EXAM  06/29/2016  . HEMOGLOBIN A1C  07/15/2016  . FOOT EXAM  04/11/2017  . COLONOSCOPY  01/31/2024  . PNA vac Low Risk Adult  Completed        Plan:   During the course of the visit TRUE was educated and counseled about the following appropriate screening and preventive services:   Vaccines to include Pneumoccal, Influenza,  Td, Zostavax - patient is due Zostavax and Tdap but declines due to cost  After clarifying medication list and changes from 03/17/16 with Dr Etheleen Nicks office and reviewing notes from visit and labs with Dr Sabra Heck from 5/30 an 5/31 patients med list was updated and review extensively with him.    D/C diltiazem  Continue amlodipine 10mg  1 tablet daily  Continue levothyroxine 143mcg + 55mcg 1 tablet of each daily (wil switch to 11mcg in the future once patient uses up current levothyroxine - states he has several bottles of 171mcg)  Change Levemir to am to  see if night time hypoglycemia improves.  Colorectal cancer screening - UTD  Cardiovascular disease screening - UTD  Glaucoma screening / Diabetic Eye Exam - UTD  Nutrition counseling - recommended 3 small meals a day with snacks to present hypoglyecmia is needed.  Reviewed CHO counting  Advanced Directives - information given  Physical Activity - increase physical activity to 150 minutes per week  Called PAP - patient needs to get information to them about current stage he is in of Medicare part D, copy of insurance card and proof of Medicare income.    Patient Instructions (the written plan) were given to the patient.   Cherre Robins, [facility]   04/27/2016

## 2016-04-28 LAB — CMP14+EGFR
ALT: 13 IU/L (ref 0–44)
AST: 16 IU/L (ref 0–40)
Albumin/Globulin Ratio: 1.1 — ABNORMAL LOW (ref 1.2–2.2)
Albumin: 3.2 g/dL — ABNORMAL LOW (ref 3.5–4.8)
Alkaline Phosphatase: 95 IU/L (ref 39–117)
BUN/Creatinine Ratio: 12 (ref 10–24)
BUN: 20 mg/dL (ref 8–27)
Bilirubin Total: 0.3 mg/dL (ref 0.0–1.2)
CO2: 20 mmol/L (ref 18–29)
Calcium: 7.7 mg/dL — ABNORMAL LOW (ref 8.6–10.2)
Chloride: 105 mmol/L (ref 96–106)
Creatinine, Ser: 1.61 mg/dL — ABNORMAL HIGH (ref 0.76–1.27)
GFR calc Af Amer: 48 mL/min/{1.73_m2} — ABNORMAL LOW (ref >59)
GFR calc non Af Amer: 41 mL/min/{1.73_m2} — ABNORMAL LOW (ref >59)
Globulin, Total: 2.9 g/dL (ref 1.5–4.5)
Glucose: 85 mg/dL (ref 65–99)
Potassium: 4.4 mmol/L (ref 3.5–5.2)
Sodium: 142 mmol/L (ref 134–144)
Total Protein: 6.1 g/dL (ref 6.0–8.5)

## 2016-04-28 LAB — LIPID PANEL
Chol/HDL Ratio: 4.5 ratio units (ref 0.0–5.0)
Cholesterol, Total: 122 mg/dL (ref 100–199)
HDL: 27 mg/dL — ABNORMAL LOW (ref >39)
LDL Calculated: 71 mg/dL (ref 0–99)
Triglycerides: 122 mg/dL (ref 0–149)
VLDL Cholesterol Cal: 24 mg/dL (ref 5–40)

## 2016-05-01 ENCOUNTER — Telehealth: Payer: Self-pay | Admitting: Pharmacist

## 2016-05-01 MED ORDER — CALCIUM CARBONATE 500 MG PO CHEW: 500.0000 mg | CHEWABLE_TABLET | Freq: Three times a day (TID) | ORAL | Status: AC | PRN

## 2016-05-01 NOTE — Telephone Encounter (Signed)
patietn notified of labs and treatment plan

## 2016-05-03 ENCOUNTER — Telehealth: Payer: Self-pay | Admitting: Pharmacist

## 2016-05-03 ENCOUNTER — Encounter: Payer: Self-pay | Admitting: *Deleted

## 2016-05-03 MED ORDER — LINAGLIPTIN 5 MG PO TABS: 5.0000 mg | ORAL_TABLET | Freq: Every day | ORAL | Status: DC

## 2016-05-03 NOTE — Telephone Encounter (Signed)
#  28 samples of tradjenta 5mg  left at front desk and patient notified.

## 2016-05-17 ENCOUNTER — Other Ambulatory Visit: Payer: Medicare Other

## 2016-05-18 LAB — BMP8+EGFR
BUN/Creatinine Ratio: 11 (ref 10–24)
BUN: 21 mg/dL (ref 8–27)
CO2: 19 mmol/L (ref 18–29)
Calcium: 8.1 mg/dL — ABNORMAL LOW (ref 8.6–10.2)
Chloride: 107 mmol/L — ABNORMAL HIGH (ref 96–106)
Creatinine, Ser: 1.95 mg/dL — ABNORMAL HIGH (ref 0.76–1.27)
GFR calc Af Amer: 38 mL/min/{1.73_m2} — ABNORMAL LOW (ref >59)
GFR calc non Af Amer: 33 mL/min/{1.73_m2} — ABNORMAL LOW (ref >59)
Glucose: 100 mg/dL — ABNORMAL HIGH (ref 65–99)
Potassium: 4.5 mmol/L (ref 3.5–5.2)
Sodium: 143 mmol/L (ref 134–144)

## 2016-05-22 ENCOUNTER — Other Ambulatory Visit: Payer: Self-pay | Admitting: Pharmacist

## 2016-05-22 DIAGNOSIS — N183 Chronic kidney disease, stage 3 (moderate): Secondary | ICD-10-CM | POA: Diagnosis not present

## 2016-05-22 DIAGNOSIS — R7989 Other specified abnormal findings of blood chemistry: Secondary | ICD-10-CM

## 2016-05-25 ENCOUNTER — Other Ambulatory Visit: Payer: Self-pay | Admitting: Family Medicine

## 2016-06-02 ENCOUNTER — Other Ambulatory Visit: Payer: Medicare Other

## 2016-06-02 DIAGNOSIS — R7989 Other specified abnormal findings of blood chemistry: Secondary | ICD-10-CM

## 2016-06-02 DIAGNOSIS — R748 Abnormal levels of other serum enzymes: Secondary | ICD-10-CM | POA: Diagnosis not present

## 2016-06-03 LAB — BMP8+EGFR
BUN/Creatinine Ratio: 12 (ref 10–24)
BUN: 21 mg/dL (ref 8–27)
CO2: 22 mmol/L (ref 18–29)
Calcium: 8.4 mg/dL — ABNORMAL LOW (ref 8.6–10.2)
Chloride: 102 mmol/L (ref 96–106)
Creatinine, Ser: 1.74 mg/dL — ABNORMAL HIGH (ref 0.76–1.27)
GFR calc Af Amer: 43 mL/min/{1.73_m2} — ABNORMAL LOW (ref >59)
GFR calc non Af Amer: 38 mL/min/{1.73_m2} — ABNORMAL LOW (ref >59)
Glucose: 107 mg/dL — ABNORMAL HIGH (ref 65–99)
Potassium: 4.5 mmol/L (ref 3.5–5.2)
Sodium: 142 mmol/L (ref 134–144)

## 2016-06-05 ENCOUNTER — Other Ambulatory Visit: Payer: Self-pay | Admitting: Pharmacist

## 2016-06-06 NOTE — Progress Notes (Signed)
Cardiology Office Note   Date:  06/07/2016   ID:  Michael Trevino, DOB [birth year], MRN [id number]  PCP:  Wardell Honour, MD  Cardiologist:   Minus Breeding, MD   Chief Complaint  Patient presents with  . Edema      History of Present Illness: Michael Trevino is a 75 y.o. male who presents for evaluation of fatigue in the face of a distant history of CAD.  He was seen at [facility] some years ago for heart attack and he reports a blockage on the back part of his heart. I don't have these records at this point.  He hasn't been followed since 2010.  He did have a TEE in 2014 which showed mild to moderate AI.  Recently he's had some fatigue. There is a mention that he was on very high doses of Cardizem. He was reduced to 360. I note that he also takes Norvasc. He's had some increased lower extremity swelling which seems to be chronic. However, he really denies a lot of symptoms. He's limited by some leg weakness. He does a little bit of activity and walking is not too hot. 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 He really doesn't describe excessive shortness of breath, PND or orthopnea. He doesn't describe chest pressure, neck or arm discomfort. He's not describing palpitations, presyncope or syncope. Of note he has a left bundle branch block which was not there on EKG in 2015.   Past Medical History:  Diagnosis Date  . Arthritis   . Diabetes mellitus without complication ([place])   . Hyperlipidemia   . Hypertension   . MI (myocardial infarction) ([place]) 2000  . Renal disorder   . Sepsis ([place])   . Thyroid disease    hypothyroid    Past Surgical History:  Procedure Laterality Date  . TEE WITHOUT CARDIOVERSION N/A 02/26/2013   Procedure: TRANSESOPHAGEAL ECHOCARDIOGRAM (TEE);  Surgeon: Thayer Headings, MD;  Location: [facility] ENDOSCOPY;  Service: Cardiovascular;  Laterality: N/A;     Current Outpatient Prescriptions  Medication Sig Dispense Refill  . amLODipine (NORVASC) 10 MG tablet  Take 10 mg by mouth daily.  1  . ascorbic acid (VITAMIN C) 500 MG tablet Take 500 mg by mouth daily.    Marland Kitchen aspirin 325 MG EC tablet Take 325 mg by mouth daily.    Marland Kitchen atorvastatin (LIPITOR) 40 MG tablet TAKE 1 TABLET (40 MG TOTAL) BY MOUTH DAILY. 90 tablet 1  . Calcium Carbonate 500 MG CHEW Chew 1 tablet (500 mg total) by mouth 3 (three) times daily as needed. 30 each   . cholecalciferol (VITAMIN D) 1000 UNITS tablet Take 1,000 Units by mouth daily.    . furosemide (LASIX) 40 MG tablet Take 1 tablet (40 mg total) by mouth 2 (two) times daily. 180 tablet 1  . Insulin Detemir (LEVEMIR FLEXPEN) 100 UNIT/ML Pen Inject 55 Units into the skin daily. 45 mL 2  . irbesartan (AVAPRO) 300 MG tablet TAKE 1 TABLET (300 MG TOTAL) BY MOUTH DAILY. 90 tablet 0  . levothyroxine (SYNTHROID, LEVOTHROID) 100 MCG tablet Take 100 mcg by mouth daily before breakfast.    . levothyroxine (SYNTHROID, LEVOTHROID) 25 MCG tablet Take 1/3 tablet by mouth daily    . linagliptin (TRADJENTA) 5 MG TABS tablet Take 1 tablet (5 mg total) by mouth daily. 28 tablet 0  . metoprolol (LOPRESSOR) 100 MG tablet TAKE 1 TABLET (100 MG TOTAL) BY MOUTH 2 (TWO) TIMES DAILY. 180 tablet 1  .  B-D UF III MINI PEN NEEDLES 31G X 5 MM MISC USE DAILY 100 each 0  . glimepiride (AMARYL) 4 MG tablet TAKE 1 TABLET (4 MG TOTAL) BY MOUTH 2 (TWO) TIMES DAILY. 180 tablet 0  . glucose blood test strip Use to check blood glucose once daily.  Dx:  E11.9 100 each 3  . Insulin Syringe-Needle U-100 (BD INSULIN SYRINGE ULTRAFINE) 31G X 5/16" 1 ML MISC Use to inject insulin daily.  Dx:  250.02 100 each 2   No current facility-administered medications for this visit.     Allergies:   Zocor [simvastatin - high dose]    Social History:  The patient  reports that he quit smoking about 31 years ago. His smoking use included Cigarettes. He has a 15.00 pack-year smoking history. He has never used smokeless tobacco. He reports that he does not drink alcohol or use drugs.    Family History:  The patient's family history includes Alcohol abuse in his brother; Cancer in his father; Diabetes in his brother, mother, and sister; Diabetes (age of onset: 93) in his brother; Hypertension in his mother; Kidney disease (age of onset: 47) in his brother; Stroke in his brother.    ROS:  Please see the history of present illness.   Otherwise, review of systems are positive for none.   All other systems are reviewed and negative.    PHYSICAL EXAM: VS:  BP (!) 148/58   Pulse (!) 56   Ht 5\' 9"  (1.753 m)   Wt 257 lb (116.6 kg)   BMI 37.95 kg/m  , BMI Body mass index is 37.95 kg/m. GENERAL:  Well appearing HEENT:  Pupils equal round and reactive, fundi not visualized, oral mucosa unremarkable NECK:  No jugular venous distention, waveform within normal limits, carotid upstroke brisk and symmetric, transmitted systolic murmur versus bilateral  bruits, no thyromegaly LYMPHATICS:  No cervical, inguinal adenopathy LUNGS:  Clear to auscultation bilaterally BACK:  No CVA tenderness CHEST:  Unremarkable HEART:  PMI not displaced or sustained,S1 and S2 within normal limits, no S3, no S4, no clicks, no rubs, 2 out of 6 apical systolic murmur radiating out the outflow tract, 2/6 diastolic murmur ABD:  Flat, positive bowel sounds normal in frequency in pitch, no bruits, no rebound, no guarding, no midline pulsatile mass, no hepatomegaly, no splenomegaly EXT:  2 plus pulses throughout, moderately severe bilateral lower edema, no cyanosis no clubbing SKIN:  No rashes no nodules NEURO:  Cranial nerves II through XII grossly intact, motor grossly intact throughout PSYCH:  Cognitively intact, oriented to person place and time    EKG:  EKG is ordered today. The ekg ordered today demonstrates sinus rhythm, rate 55, left axis deviation, left bundle block.   Recent Labs: 04/11/2016: TSH 7.360 04/27/2016: ALT 13 06/02/2016: BUN 21; Creatinine, Ser 1.74; Potassium 4.5; Sodium 142   Lab  Results  Component Value Date   HGBA1C 9.0 01/13/2016    Lipid Panel    Component Value Date/Time   CHOL 122 04/27/2016 0945   CHOL 108 04/11/2013 1153   TRIG 122 04/27/2016 0945   TRIG 98 07/17/2013 0940   TRIG 131 04/11/2013 1153   HDL 27 (L) 04/27/2016 0945   HDL 31 (L) 07/17/2013 0940   HDL 33 (L) 04/11/2013 1153   CHOLHDL 4.5 04/27/2016 0945   LDLCALC 71 04/27/2016 0945   LDLCALC 49 07/17/2013 0940   LDLCALC 49 04/11/2013 1153     Lab Results  Component Value Date  TSH 7.360 (H) 04/11/2016    Wt Readings from Last 3 Encounters:  06/07/16 257 lb (116.6 kg)  04/27/16 255 lb 8 oz (115.9 kg)  04/11/16 256 lb (116.1 kg)      Other studies Reviewed: Additional studies/ records that were reviewed today include: TEE, [facility] records. Review of the above records demonstrates:  Please see elsewhere in the note.     ASSESSMENT AND PLAN:  Type 2 diabetes mellitus with stage 3 chronic kidney disease, with long-term current use of insulin ([place]):  A1c was 9. However, I reviewed his blood sugars he says they're better controlled now and he's going to get a follow-up A1c.  LBBB:   This is new. I'm going to start with an echocardiogram that he will eventually need an ischemia workup  Essential hypertension:  The blood pressure is controlled. However, he shouldn't be both on Norvasc and Cardizem. Cardizem. He's going to keep a blood pressure diary.  Hyperlipidemia:  His cholesterol is actually well controlled. He's going to continue the meds as listed.  Fatigue:    He did have a mildly elevated TSH recently and I reviewed these. I'll consider further workup in the future with T3-T4.  Edema:  I will start the echocardiogram as above. We did talk about salt restriction.  CAD:  I will see if I can get the records from [facility]. 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  BRUIT:  I will consider carotid Dopplers although this might be a transmitted murmur.   Current medicines are reviewed at length with the patient today.  The patient does not have concerns regarding medicines.  The following changes have been made:  As above  Labs/ tests ordered today include:   Orders Placed This Encounter  Procedures  . EKG 12-Lead  . ECHOCARDIOGRAM COMPLETE     Disposition:   FU with me in one month.    Signed, Minus Breeding, MD  06/07/2016 4:10 PM    [facility]

## 2016-06-07 ENCOUNTER — Encounter: Payer: Self-pay | Admitting: Cardiology

## 2016-06-07 ENCOUNTER — Ambulatory Visit (INDEPENDENT_AMBULATORY_CARE_PROVIDER_SITE_OTHER): Payer: Medicare Other | Admitting: Cardiology

## 2016-06-07 VITALS — BP 148/58 | HR 56 | Ht 69.0 in | Wt 257.0 lb

## 2016-06-07 DIAGNOSIS — I351 Nonrheumatic aortic (valve) insufficiency: Secondary | ICD-10-CM

## 2016-06-07 DIAGNOSIS — I2510 Atherosclerotic heart disease of native coronary artery without angina pectoris: Secondary | ICD-10-CM | POA: Diagnosis not present

## 2016-06-07 DIAGNOSIS — M7989 Other specified soft tissue disorders: Secondary | ICD-10-CM | POA: Diagnosis not present

## 2016-06-07 NOTE — Patient Instructions (Signed)
Medication Instructions:  Please stop your Cardizem. Continue all other medications as listed.  Testing/Procedures: Your physician has requested that you have an echocardiogram. Echocardiography is a painless test that uses sound waves to create images of your heart. It provides your doctor with information about the size and shape of your heart and how well your heart's chambers and valves are working. This procedure takes approximately one hour. There are no restrictions for this procedure.  Follow-Up: Follow up in approximately 1 month with Dr Percival Spanish in [place].  If you need a refill on your cardiac medications before your next appointment, please call your pharmacy.  Thank you for choosing [facility]!!

## 2016-06-08 NOTE — Addendum Note (Signed)
Addended by: Shellia Cleverly on: 06/08/2016 04:31 PM   Modules accepted: Orders

## 2016-06-15 ENCOUNTER — Encounter: Payer: Self-pay | Admitting: *Deleted

## 2016-06-15 ENCOUNTER — Telehealth: Payer: Self-pay | Admitting: Cardiology

## 2016-06-15 NOTE — Telephone Encounter (Signed)
New Message  Caryl Pina from [facility] call requesting to speak with RN about retrieving a current list of pts medications. Caryl Pina states pt is participating in a CHF program and she will need that information. Caryl Pina provided a fax number to send information to [phone]. Please call back to discuss if needed

## 2016-06-15 NOTE — Telephone Encounter (Signed)
Returned call to [place] from [facility] updated current medication list for CHF program that patient is currently a participate in.   Letter created with current medication list and faxed to [phone].

## 2016-06-20 ENCOUNTER — Encounter (INDEPENDENT_AMBULATORY_CARE_PROVIDER_SITE_OTHER): Payer: Self-pay

## 2016-06-20 ENCOUNTER — Other Ambulatory Visit: Payer: Self-pay

## 2016-06-20 ENCOUNTER — Ambulatory Visit (HOSPITAL_COMMUNITY): Payer: Medicare Other | Attending: Cardiology

## 2016-06-20 DIAGNOSIS — E1122 Type 2 diabetes mellitus with diabetic chronic kidney disease: Secondary | ICD-10-CM | POA: Insufficient documentation

## 2016-06-20 DIAGNOSIS — I2510 Atherosclerotic heart disease of native coronary artery without angina pectoris: Secondary | ICD-10-CM | POA: Insufficient documentation

## 2016-06-20 DIAGNOSIS — I359 Nonrheumatic aortic valve disorder, unspecified: Secondary | ICD-10-CM | POA: Diagnosis present

## 2016-06-20 DIAGNOSIS — I351 Nonrheumatic aortic (valve) insufficiency: Secondary | ICD-10-CM | POA: Diagnosis not present

## 2016-06-20 DIAGNOSIS — I1310 Hypertensive heart and chronic kidney disease without heart failure, with stage 1 through stage 4 chronic kidney disease, or unspecified chronic kidney disease: Secondary | ICD-10-CM | POA: Diagnosis not present

## 2016-06-20 DIAGNOSIS — E785 Hyperlipidemia, unspecified: Secondary | ICD-10-CM | POA: Diagnosis not present

## 2016-06-20 DIAGNOSIS — I252 Old myocardial infarction: Secondary | ICD-10-CM | POA: Insufficient documentation

## 2016-06-20 DIAGNOSIS — N183 Chronic kidney disease, stage 3 (moderate): Secondary | ICD-10-CM | POA: Insufficient documentation

## 2016-06-20 DIAGNOSIS — I313 Pericardial effusion (noninflammatory): Secondary | ICD-10-CM | POA: Insufficient documentation

## 2016-06-20 DIAGNOSIS — Z87891 Personal history of nicotine dependence: Secondary | ICD-10-CM | POA: Insufficient documentation

## 2016-06-23 ENCOUNTER — Other Ambulatory Visit: Payer: Medicare Other

## 2016-06-24 LAB — BMP8+EGFR
BUN/Creatinine Ratio: 10 (ref 10–24)
BUN: 17 mg/dL (ref 8–27)
CO2: 21 mmol/L (ref 18–29)
Calcium: 7.8 mg/dL — ABNORMAL LOW (ref 8.6–10.2)
Chloride: 105 mmol/L (ref 96–106)
Creatinine, Ser: 1.71 mg/dL — ABNORMAL HIGH (ref 0.76–1.27)
GFR calc Af Amer: 44 mL/min/{1.73_m2} — ABNORMAL LOW (ref >59)
GFR calc non Af Amer: 38 mL/min/{1.73_m2} — ABNORMAL LOW (ref >59)
Glucose: 99 mg/dL (ref 65–99)
Potassium: 4.5 mmol/L (ref 3.5–5.2)
Sodium: 142 mmol/L (ref 134–144)

## 2016-06-29 ENCOUNTER — Other Ambulatory Visit: Payer: Self-pay | Admitting: Pharmacist

## 2016-07-03 NOTE — Progress Notes (Signed)
Cardiology Office Note   Date:  07/05/2016   ID:  Michael Trevino, DOB 03/20/1941, MRN [id number]  PCP:  Wardell Honour, MD  Cardiologist:   Minus Breeding, MD   Chief Complaint  Patient presents with  . AICD Problem      History of Present Illness: Michael Trevino is a 75 y.o. male who presents for follow up of CAD.  He was seen at [facility] some years ago for heart attack and he reports a blockage on the back part of his heart. I don't have these records at this point.  He hasn't been followed since 2010.  He did have a TEE in 2014 which showed mild to moderate AI.  After, my first visit with him I ordered an echo which showed moderate AI and a normal EF.  I was not able to find any recent [facility] records.  In particular there was no recent ischemia work up.  He returns for follow up.  His only complaint continues to be lower extremity swelling. He does some activities such as driving the tractor. He complained about fatigue for this is still ongoing and somewhat limited.   Past Medical History:  Diagnosis Date  . Arthritis   . Diabetes mellitus without complication ([place])   . Hyperlipidemia   . Hypertension   . MI (myocardial infarction) ([place]) 2000  . Renal disorder   . Sepsis ([place])   . Thyroid disease    hypothyroid    Past Surgical History:  Procedure Laterality Date  . TEE WITHOUT CARDIOVERSION N/A 02/26/2013   Procedure: TRANSESOPHAGEAL ECHOCARDIOGRAM (TEE);  Surgeon: Thayer Headings, MD;  Location: [facility] ENDOSCOPY;  Service: Cardiovascular;  Laterality: N/A;     Current Outpatient Prescriptions  Medication Sig Dispense Refill  . amLODipine (NORVASC) 10 MG tablet Take 10 mg by mouth daily.  1  . ascorbic acid (VITAMIN C) 500 MG tablet Take 500 mg by mouth daily.    Marland Kitchen aspirin 325 MG EC tablet Take 325 mg by mouth daily.    Marland Kitchen atorvastatin (LIPITOR) 40 MG tablet TAKE 1 TABLET (40 MG TOTAL) BY MOUTH DAILY. 90 tablet 1  . B-D INS SYR ULTRAFINE 1CC/31G 31G X 5/16" 1 ML MISC  USE TO INJECT INSULIN DAILY. DX: 250.02 100 each 2  . B-D UF III MINI PEN NEEDLES 31G X 5 MM MISC USE DAILY 100 each 0  . Calcium Carbonate 500 MG CHEW Chew 1 tablet (500 mg total) by mouth 3 (three) times daily as needed. 30 each   . cholecalciferol (VITAMIN D) 1000 UNITS tablet Take 1,000 Units by mouth daily.    . furosemide (LASIX) 40 MG tablet Take 1 tablet (40 mg total) by mouth 2 (two) times daily. 180 tablet 1  . glimepiride (AMARYL) 4 MG tablet TAKE 1 TABLET (4 MG TOTAL) BY MOUTH 2 (TWO) TIMES DAILY. 180 tablet 0  . glucose blood test strip Use to check blood glucose once daily.  Dx:  E11.9 100 each 3  . Insulin Detemir (LEVEMIR FLEXPEN) 100 UNIT/ML Pen Inject 55 Units into the skin daily. 45 mL 2  . irbesartan (AVAPRO) 300 MG tablet TAKE 1 TABLET (300 MG TOTAL) BY MOUTH DAILY. 90 tablet 0  . levothyroxine (SYNTHROID, LEVOTHROID) 100 MCG tablet Take 100 mcg by mouth daily before breakfast.    . linagliptin (TRADJENTA) 5 MG TABS tablet Take 1 tablet (5 mg total) by mouth daily. 28 tablet 0  . metoprolol (LOPRESSOR) 100 MG tablet TAKE 1  TABLET (100 MG TOTAL) BY MOUTH 2 (TWO) TIMES DAILY. 180 tablet 1   No current facility-administered medications for this visit.     Allergies:   Zocor [simvastatin - high dose]    ROS:  Please see the history of present illness.   Otherwise, review of systems are positive for none.   All other systems are reviewed and negative.    PHYSICAL EXAM: VS:  BP (!) 155/65   Pulse 72   Ht 5\' 9"  (1.753 m)   Wt 250 lb (113.4 kg)   BMI 36.92 kg/m  , BMI Body mass index is 36.92 kg/m. GENERAL:  Well appearing NECK:  No jugular venous distention, waveform within normal limits, carotid upstroke brisk and symmetric, transmitted systolic murmur versus bilateral  bruits, no thyromegaly LYMPHATICS:  No cervical, inguinal adenopathy LUNGS:  Clear to auscultation bilaterally CHEST:  Unremarkable HEART:  PMI not displaced or sustained,S1 and S2 within normal  limits, no S3, no S4, no clicks, no rubs, 2 out of 6 apical systolic murmur radiating out the outflow tract, 2/6 diastolic murmur ABD:  Flat, positive bowel sounds normal in frequency in pitch, no bruits, no rebound, no guarding, no midline pulsatile mass, no hepatomegaly, no splenomegaly EXT:  2 plus pulses throughout, moderately severe bilateral lower edema, no cyanosis no clubbing SKIN:  No rashes no nodules   EKG:  EKG is not ordered today.    Recent Labs: 04/11/2016: TSH 7.360 04/27/2016: ALT 13 06/23/2016: BUN 17; Creatinine, Ser 1.71; Potassium 4.5; Sodium 142   Lab Results  Component Value Date   HGBA1C 9.0 01/13/2016    Lipid Panel    Component Value Date/Time   CHOL 122 04/27/2016 0945   CHOL 108 04/11/2013 1153   TRIG 122 04/27/2016 0945   TRIG 98 07/17/2013 0940   TRIG 131 04/11/2013 1153   HDL 27 (L) 04/27/2016 0945   HDL 31 (L) 07/17/2013 0940   HDL 33 (L) 04/11/2013 1153   CHOLHDL 4.5 04/27/2016 0945   LDLCALC 71 04/27/2016 0945   LDLCALC 49 07/17/2013 0940   LDLCALC 49 04/11/2013 1153     Lab Results  Component Value Date   TSH 7.360 (H) 04/11/2016    Wt Readings from Last 3 Encounters:  07/05/16 250 lb (113.4 kg)  06/07/16 257 lb (116.6 kg)  04/27/16 255 lb 8 oz (115.9 kg)      Other studies Reviewed: Additional studies/ records that were reviewed today include: Echo Review of the above records demonstrates:  Please see elsewhere in the note.     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  I will follow this clinically and with repeat echo in one year  Type 2 diabetes mellitus with stage 3 chronic kidney disease, with long-term current use of insulin ([name]):  A1c was 9 .  He reported better control recently and he is going to have follow-up.  Unfortunately he cannot afford Tradjenta and will talk with Wardell Honour, MD about this .  LBBB:   This is new. EF was OK by echo with no wall motion abnormality.  I will follow up with  ischemia testing.  He will have a [employer].  Essential hypertension:  The blood pressure is elevated today but he says it's okay at home. He will continue his blood pressure diary which I reviewed. He's going to get a new cuff and will bring it to [place] to calibrate.    Hyperlipidemia:  His cholesterol is  actually well controlled. He's going to continue the meds as listed.  Fatigue:    This could be an anginal equivalent.  This will be evaluated as above.   Edema:  We did talk about salt restriction.  I will check venous Dopplers   CAD:  As above  BRUIT:  He will get carotid Dopplers   Current medicines are reviewed at length with the patient today.  The patient does not have concerns regarding medicines.  The following changes have been made:  As above  Labs/ tests ordered today include:   Orders Placed This Encounter  Procedures  . Myocardial Perfusion Imaging     Disposition:   FU with me after all of the studies above.    Signed, Minus Breeding, MD  07/05/2016 8:17 PM    [place]

## 2016-07-05 ENCOUNTER — Ambulatory Visit (INDEPENDENT_AMBULATORY_CARE_PROVIDER_SITE_OTHER): Payer: Medicare Other | Admitting: Cardiology

## 2016-07-05 ENCOUNTER — Encounter: Payer: Self-pay | Admitting: Cardiology

## 2016-07-05 VITALS — BP 155/65 | HR 72 | Ht 69.0 in | Wt 250.0 lb

## 2016-07-05 DIAGNOSIS — R600 Localized edema: Secondary | ICD-10-CM

## 2016-07-05 DIAGNOSIS — I2510 Atherosclerotic heart disease of native coronary artery without angina pectoris: Secondary | ICD-10-CM | POA: Diagnosis not present

## 2016-07-05 DIAGNOSIS — R0989 Other specified symptoms and signs involving the circulatory and respiratory systems: Secondary | ICD-10-CM | POA: Diagnosis not present

## 2016-07-05 DIAGNOSIS — I2583 Coronary atherosclerosis due to lipid rich plaque: Secondary | ICD-10-CM

## 2016-07-05 NOTE — Patient Instructions (Signed)
Medication Instructions:  The current medical regimen is effective;  continue present plan and medications.  Testing/Procedures: Your physician has requested that you have a lower extremity arterial exercise duplex. During this test, exercise and ultrasound are used to evaluate arterial blood flow in the legs. Allow one hour for this exam. There are no restrictions or special instructions.  Your physician has requested that you have a lexiscan myoview. For further information please visit [email or web address]. Please follow instruction sheet, as given.  Your physician has requested that you have a carotid duplex. This test is an ultrasound of the carotid arteries in your neck. It looks at blood flow through these arteries that supply the brain with blood. Allow one hour for this exam. There are no restrictions or special instructions.  Follow-Up: Follow up after the above listed testing with Dr Percival Spanish in [place].  If you need a refill on your cardiac medications before your next appointment, please call your pharmacy.  Thank you for choosing [facility]!!

## 2016-07-07 ENCOUNTER — Other Ambulatory Visit: Payer: Self-pay | Admitting: Family Medicine

## 2016-07-13 ENCOUNTER — Other Ambulatory Visit: Payer: Self-pay | Admitting: Family Medicine

## 2016-07-13 ENCOUNTER — Telehealth (HOSPITAL_COMMUNITY): Payer: Self-pay

## 2016-07-13 NOTE — Telephone Encounter (Signed)
Encounter complete. 

## 2016-07-18 ENCOUNTER — Ambulatory Visit (HOSPITAL_COMMUNITY)
Admission: RE | Admit: 2016-07-18 | Discharge: 2016-07-18 | Disposition: A | Discharge status: Home / Self Care | Payer: Medicare Other | Source: Ambulatory Visit | Attending: Cardiology | Admitting: Cardiology

## 2016-07-18 ENCOUNTER — Ambulatory Visit (HOSPITAL_COMMUNITY)
Admission: RE | Admit: 2016-07-18 | Discharge: 2016-07-18 | Disposition: A | Discharge status: Home / Self Care | Payer: Medicare Other | Source: Ambulatory Visit | Attending: Internal Medicine | Admitting: Internal Medicine

## 2016-07-18 DIAGNOSIS — E669 Obesity, unspecified: Secondary | ICD-10-CM | POA: Insufficient documentation

## 2016-07-18 DIAGNOSIS — R0609 Other forms of dyspnea: Secondary | ICD-10-CM | POA: Diagnosis not present

## 2016-07-18 DIAGNOSIS — I2510 Atherosclerotic heart disease of native coronary artery without angina pectoris: Secondary | ICD-10-CM | POA: Insufficient documentation

## 2016-07-18 DIAGNOSIS — I10 Essential (primary) hypertension: Secondary | ICD-10-CM | POA: Insufficient documentation

## 2016-07-18 DIAGNOSIS — I6523 Occlusion and stenosis of bilateral carotid arteries: Secondary | ICD-10-CM | POA: Insufficient documentation

## 2016-07-18 DIAGNOSIS — Z87891 Personal history of nicotine dependence: Secondary | ICD-10-CM | POA: Insufficient documentation

## 2016-07-18 DIAGNOSIS — R079 Chest pain, unspecified: Secondary | ICD-10-CM | POA: Insufficient documentation

## 2016-07-18 DIAGNOSIS — R600 Localized edema: Secondary | ICD-10-CM | POA: Diagnosis not present

## 2016-07-18 DIAGNOSIS — I447 Left bundle-branch block, unspecified: Secondary | ICD-10-CM | POA: Diagnosis not present

## 2016-07-18 DIAGNOSIS — Z6837 Body mass index (BMI) 37.0-37.9, adult: Secondary | ICD-10-CM | POA: Insufficient documentation

## 2016-07-18 DIAGNOSIS — E785 Hyperlipidemia, unspecified: Secondary | ICD-10-CM | POA: Insufficient documentation

## 2016-07-18 DIAGNOSIS — E119 Type 2 diabetes mellitus without complications: Secondary | ICD-10-CM | POA: Diagnosis not present

## 2016-07-18 DIAGNOSIS — N183 Chronic kidney disease, stage 3 (moderate): Secondary | ICD-10-CM | POA: Diagnosis not present

## 2016-07-18 DIAGNOSIS — E1122 Type 2 diabetes mellitus with diabetic chronic kidney disease: Secondary | ICD-10-CM | POA: Diagnosis not present

## 2016-07-18 DIAGNOSIS — R0989 Other specified symptoms and signs involving the circulatory and respiratory systems: Secondary | ICD-10-CM | POA: Diagnosis not present

## 2016-07-18 DIAGNOSIS — R5383 Other fatigue: Secondary | ICD-10-CM | POA: Insufficient documentation

## 2016-07-18 DIAGNOSIS — I1310 Hypertensive heart and chronic kidney disease without heart failure, with stage 1 through stage 4 chronic kidney disease, or unspecified chronic kidney disease: Secondary | ICD-10-CM | POA: Insufficient documentation

## 2016-07-18 DIAGNOSIS — I2583 Coronary atherosclerosis due to lipid rich plaque: Secondary | ICD-10-CM

## 2016-07-18 LAB — MYOCARDIAL PERFUSION IMAGING
LV dias vol: 175 mL (ref 62–150)
LV sys vol: 84 mL
Peak HR: 86 {beats}/min
Rest HR: 68 {beats}/min
SDS: 7
SRS: 5
SSS: 12
TID: 1.05

## 2016-07-18 MED ORDER — TECHNETIUM TC 99M TETROFOSMIN IV KIT
10.9000 | PACK | Freq: Once | INTRAVENOUS | Status: AC | PRN
  Administered 2016-07-18: 10.9 via INTRAVENOUS
  Filled 2016-07-18: qty 11

## 2016-07-18 MED ORDER — TECHNETIUM TC 99M TETROFOSMIN IV KIT
30.8000 | PACK | Freq: Once | INTRAVENOUS | Status: AC | PRN
  Administered 2016-07-18: 30.8 via INTRAVENOUS
  Filled 2016-07-18: qty 31

## 2016-07-18 MED ORDER — REGADENOSON 0.4 MG/5ML IV SOLN
0.4000 mg | Freq: Once | INTRAVENOUS | Status: AC
  Administered 2016-07-18: 0.4 mg via INTRAVENOUS

## 2016-07-20 ENCOUNTER — Ambulatory Visit (INDEPENDENT_AMBULATORY_CARE_PROVIDER_SITE_OTHER): Payer: Medicare Other | Admitting: Pharmacist

## 2016-07-20 ENCOUNTER — Ambulatory Visit (INDEPENDENT_AMBULATORY_CARE_PROVIDER_SITE_OTHER): Payer: Medicare Other

## 2016-07-20 ENCOUNTER — Ambulatory Visit (INDEPENDENT_AMBULATORY_CARE_PROVIDER_SITE_OTHER): Payer: Medicare Other | Admitting: Nurse Practitioner

## 2016-07-20 ENCOUNTER — Encounter: Payer: Self-pay | Admitting: Nurse Practitioner

## 2016-07-20 VITALS — BP 150/68 | HR 82 | Temp 98.5°F | Ht 69.0 in | Wt 250.0 lb

## 2016-07-20 VITALS — BP 142/60 | HR 84 | Ht 69.0 in | Wt 247.5 lb

## 2016-07-20 DIAGNOSIS — L03012 Cellulitis of left finger: Secondary | ICD-10-CM

## 2016-07-20 DIAGNOSIS — M79645 Pain in left finger(s): Secondary | ICD-10-CM

## 2016-07-20 DIAGNOSIS — E1122 Type 2 diabetes mellitus with diabetic chronic kidney disease: Secondary | ICD-10-CM

## 2016-07-20 DIAGNOSIS — I10 Essential (primary) hypertension: Secondary | ICD-10-CM

## 2016-07-20 DIAGNOSIS — N183 Chronic kidney disease, stage 3 (moderate): Secondary | ICD-10-CM

## 2016-07-20 DIAGNOSIS — Z794 Long term (current) use of insulin: Secondary | ICD-10-CM | POA: Diagnosis not present

## 2016-07-20 MED ORDER — CEPHALEXIN 500 MG PO CAPS
500.0000 mg | ORAL_CAPSULE | Freq: Three times a day (TID) | ORAL | 0 refills | Status: DC
Start: 2016-07-20 — End: 2016-10-20

## 2016-07-20 NOTE — Progress Notes (Signed)
   Subjective:    Patient ID: Michael Trevino, male    DOB: 1940-12-26, 75 y.o.   MRN: [id number]  HPI Patient in  today c/o left 5th finger pain. Started about 3 days ago- pain is at distal PIP joint. Hurts to bend or move.    Review of Systems  Constitutional: Negative.   HENT: Negative.   Respiratory: Negative.   Cardiovascular: Negative.   Genitourinary: Negative.   Musculoskeletal: Positive for joint swelling.  Neurological: Negative.   Psychiatric/Behavioral: Negative.   All other systems reviewed and are negative.      Objective:   Physical Exam  Constitutional: He is oriented to person, place, and time. He appears well-developed and well-nourished. No distress.  Cardiovascular: Normal rate and normal heart sounds.   Pulmonary/Chest: Effort normal and breath sounds normal.  Musculoskeletal:  Distal left fifith finger erythematius and edematous- very sore to touch.  Neurological: He is alert and oriented to person, place, and time.  Skin: Skin is warm.  Psychiatric: He has a normal mood and affect. His behavior is normal. Judgment and thought content normal.    BP (!) 150/68 (BP Location: Left Arm, Cuff Size: Normal)   Pulse 82   Temp 98.5 F (36.9 C) (Oral)   Ht 5\' 9"  (1.753 m)   Wt 250 lb (113.4 kg)   BMI 36.92 kg/m   Left fifth finger x ray- degenerative changes but other wise normal-Preliminary reading by Ronnald Collum, FNP  [facility]        Assessment & Plan:  1. Finger pain, left - DG Finger Little Left; Future  2. Paronychia of fifth finger of left hand Soak in epsom salt BID Labs pending - cephALEXin (KEFLEX) 500 MG capsule; Take 1 capsule (500 mg total) by mouth 3 (three) times daily.  Dispense: 30 capsule; Refill: 0  Mary-Margaret Hassell Done, FNP

## 2016-07-20 NOTE — Progress Notes (Signed)
Patient ID: Michael Trevino, male   DOB: [birth year], 75 y.o.   MRN: [id number] Diabetes Follow-Up Visit Chief Complaint:   Chief Complaint  Patient presents with  . Diabetes     Vitals:   07/20/16 0859 07/20/16 0915  BP: (!) 144/58 (!) 142/60  Pulse: 84     HPI:  Patient with long standing diabetes here today for follow up uncontrolled type 2 DM with need of insulin therapy.   Last A1c was 7.8% from  04/27/2016.  This was an improvement since March 2017 when A1c was 9.0%.   Patient also c/o of swollen and very painful left pinky.  He is guarding when I try to touch finger.  It appears swollen and bruised.   Current Diabetes Medications:  Levemir 57 units daily, glimepiride 4mg  bid and Tradjenta 5mg  daily Patient received Levemir from [facility] Patient Assistance Program and still has several vials currently. He states that cost of [place] is too high but A1c has improved greatly since restarting.  We have been giving samples of Tradjenta over the last few months.  Low fat/carbohydrate diet? Has been making better food choices to limit CHO intake and better control BG. He has been trying to decrease salt intake as well for BP Nicotine Abuse?  No Medication Compliance?  Yes Exercise?  Yes - is waking usually about 10 minutes 3 days per week Alcohol Abuse?  No  Home BG Monitoring:  Checking about 5 times per week.   HBG readings: 91 - 148 AM - 101, 99, 99, 96, 91, 105, 95, 115, 115 PM - 148, 145, 138, 130, 135  Home BP readings:  132/62, 135/60, 142/63, 116/55, 129/57, 141/60, 127/59, 125/53, 128/59, 127/58, 145/60   Lab Results  Component Value Date   CHOL 122 04/27/2016   HDL 27 (L) 04/27/2016   LDLCALC 71 04/27/2016   TRIG 122 04/27/2016   CHOLHDL 4.5 04/27/2016      Assessment: 1.  Diabetes - most recent HBG readings show improved control - A1c due next month 2.  Blood Pressure - slightly elevated today but at home BP is at goal 3.  Lipids - LDL  at goal and patient is compliant with statin 4.  Stage 3 chronic kidney disease (last estimated serum creatinine was 37) 5.  Painful finger -   Recommendations: 1.  Medication recommendations at this time are as follows:    Continue Levemir to 55 units once a day  Continue all other medications at this time  I contacted [facility] to see if they can get Tradjenta for patient through their program. No Tradjenta sample today in office but I did give him Januvia 100mg  1/2 tablet qd (split tablets for him) for 2 weeks until can get Tradjenta samples or meds from PAP. 2.  Reviewed HBG goals:  Fasting 80-130 and 1-2 hour post prandial <180. Checked BG qd  4.  LDL goal of < 100, HDL > 40 and TG < 150. 5.  Eye Exam yearly and Dental Exam every 6 months. - reminded that eye exam is due 6.  Dietary recommendations: Continue to limit CHO and salt intake  7.  Physical Activity recommendations:  Increase exercise with 15 minutes daily and work up to 30 minutes as tolerated.   8.  Return to clinic 2 months to see Dr Sabra Heck.  6 months to see me   RTC later today to see provider regarding finger swelling / pain  Orders Placed This Encounter  Procedures  . Microalbumin / creatinine urine ratio     Time spent counseling patient:  30 minutes  Referring provider:  Gwynneth Aliment, PharmD, CPP, CDE

## 2016-07-20 NOTE — Patient Instructions (Addendum)
Bring in blood pressure cuff from home so we can check with our clinic blood pressure cuff Remember to schedule yearly eye exam - due now.  [phone]  DASH Eating Plan DASH stands for "Dietary Approaches to Stop Hypertension." The DASH eating plan is a healthy eating plan that has been shown to reduce high blood pressure (hypertension). Additional health benefits may include reducing the risk of type 2 diabetes mellitus, heart disease, and stroke. The DASH eating plan may also help with weight loss. WHAT DO I NEED TO KNOW ABOUT THE DASH EATING PLAN? For the DASH eating plan, you will follow these general guidelines:  Choose foods with a percent daily value for sodium of less than 5% (as listed on the food label).  Use salt-free seasonings or herbs instead of table salt or sea salt.  Check with your health care provider or pharmacist before using salt substitutes.  Eat lower-sodium products, often labeled as "lower sodium" or "no salt added."  Eat fresh foods.  Eat more vegetables, fruits, and low-fat dairy products.  Choose whole grains. Look for the word "whole" as the first word in the ingredient list.  Choose fish and skinless chicken or [place] more often than red meat. Limit fish, poultry, and meat to 6 oz (170 g) each day.  Limit sweets, desserts, sugars, and sugary drinks.  Choose heart-healthy fats.  Limit cheese to 1 oz (28 g) per day.  Eat more home-cooked food and less restaurant, buffet, and fast food.  Limit fried foods.  Cook foods using methods other than frying.  Limit canned vegetables. If you do use them, rinse them well to decrease the sodium.  When eating at a restaurant, ask that your food be prepared with less salt, or no salt if possible. WHAT FOODS CAN I EAT? Seek help from a dietitian for individual calorie needs. Grains Whole grain or whole wheat bread. Brown rice. Whole grain or whole wheat pasta. Quinoa, bulgur, and whole grain cereals.  Low-sodium cereals. Corn or whole wheat flour tortillas. Whole grain cornbread. Whole grain crackers. Low-sodium crackers. Vegetables Fresh or frozen vegetables (raw, steamed, roasted, or grilled). Low-sodium or reduced-sodium tomato and vegetable juices. Low-sodium or reduced-sodium tomato sauce and paste. Low-sodium or reduced-sodium canned vegetables.  Fruits All fresh, canned (in natural juice), or frozen fruits. Meat and Other Protein Products Ground beef (85% or leaner), grass-fed beef, or beef trimmed of fat. Skinless chicken or [place]. Ground chicken or [place]. Pork trimmed of fat. All fish and seafood. Eggs. Dried beans, peas, or lentils. Unsalted nuts and seeds. Unsalted canned beans. Dairy Low-fat dairy products, such as skim or 1% milk, 2% or reduced-fat cheeses, low-fat ricotta or cottage cheese, or plain low-fat yogurt. Low-sodium or reduced-sodium cheeses. Fats and Oils Tub margarines without trans fats. Light or reduced-fat mayonnaise and salad dressings (reduced sodium). Avocado. Safflower, olive, or canola oils. Natural peanut or almond butter. Other Unsalted popcorn and pretzels. The items listed above may not be a complete list of recommended foods or beverages. Contact your dietitian for more options. WHAT FOODS ARE NOT RECOMMENDED? Grains White bread. White pasta. White rice. Refined cornbread. Bagels and croissants. Crackers that contain trans fat. Vegetables Creamed or fried vegetables. Vegetables in a cheese sauce. Regular canned vegetables. Regular canned tomato sauce and paste. Regular tomato and vegetable juices. Fruits Dried fruits. Canned fruit in light or heavy syrup. Fruit juice. Meat and Other Protein Products Fatty cuts of meat. Ribs, chicken wings, bacon, sausage, bologna, salami, chitterlings, fatback, hot  dogs, bratwurst, and packaged luncheon meats. Salted nuts and seeds. Canned beans with salt. Dairy Whole or 2% milk, cream, half-and-half, and cream  cheese. Whole-fat or sweetened yogurt. Full-fat cheeses or blue cheese. Nondairy creamers and whipped toppings. Processed cheese, cheese spreads, or cheese curds. Condiments Onion and garlic salt, seasoned salt, table salt, and sea salt. Canned and packaged gravies. Worcestershire sauce. Tartar sauce. Barbecue sauce. Teriyaki sauce. Soy sauce, including reduced sodium. Steak sauce. Fish sauce. Oyster sauce. Cocktail sauce. Horseradish. Ketchup and mustard. Meat flavorings and tenderizers. Bouillon cubes. Hot sauce. Tabasco sauce. Marinades. Taco seasonings. Relishes. Fats and Oils Butter, stick margarine, lard, shortening, ghee, and bacon fat. Coconut, palm kernel, or palm oils. Regular salad dressings. Other Pickles and olives. Salted popcorn and pretzels. The items listed above may not be a complete list of foods and beverages to avoid. Contact your dietitian for more information. WHERE CAN I FIND MORE INFORMATION? National Heart, Lung, and Blood Institute: travelstabloid.com   This information is not intended to replace advice given to you by your health care provider. Make sure you discuss any questions you have with your health care provider.   Document Released: 10/19/2011 Document Revised: 11/20/2014 Document Reviewed: 09/03/2013 Elsevier Interactive Patient Education [employer].

## 2016-07-20 NOTE — Patient Instructions (Signed)
Paronychia °Paronychia is an infection of the skin that surrounds a nail. It usually affects the skin around a fingernail, but it may also occur near a toenail. It often causes pain and swelling around the nail. This condition may come on suddenly or develop over a longer period. In some cases, a collection of pus (abscess) can form near or under the nail. Usually, paronychia is not serious and it clears up with treatment. °CAUSES °This condition may be caused by bacteria or fungi. It is commonly caused by either Streptococcus or Staphylococcus bacteria. The bacteria or fungi often cause the infection by getting into the affected area through an opening in the skin, such as a cut or a hangnail. °RISK FACTORS °This condition is more likely to develop in: °· People who get their hands wet often, such as those who work as dishwashers, bartenders, or nurses. °· People who bite their fingernails or suck their thumbs. °· People who trim their nails too short. °· People who have hangnails or injured fingertips. °· People who get manicures. °· People who have diabetes. °SYMPTOMS °Symptoms of this condition include: °· Redness and swelling of the skin near the nail. °· Tenderness around the nail when you touch the area. °· Pus-filled bumps under the cuticle. The cuticle is the skin at the base or sides of the nail. °· Fluid or pus under the nail. °· Throbbing pain in the area. °DIAGNOSIS °This condition is usually diagnosed with a physical exam. In some cases, a sample of pus may be taken from an abscess to be tested in a lab. This can help to determine what type of bacteria or fungi is causing the condition. °TREATMENT °Treatment for this condition depends on the cause and severity of the condition. If the condition is mild, it may clear up on its own in a few days. Your health care provider may recommend soaking the affected area in warm water a few times a day. When treatment is needed, the options may  include: °· Antibiotic medicine, if the condition is caused by a bacterial infection. °· Antifungal medicine, if the condition is caused by a fungal infection. °· Incision and drainage, if an abscess is present. In this procedure, the health care provider will cut open the abscess so the pus can drain out. °HOME CARE INSTRUCTIONS °· Soak the affected area in warm water if directed to do so by your health care provider. You may be told to do this for 20 minutes, 2-3 times a day. Keep the area dry in between soakings. °· Take medicines only as directed by your health care provider. °· If you were prescribed an antibiotic medicine, finish all of it even if you start to feel better. °· Keep the affected area clean. °· Do not try to drain a fluid-filled bump yourself. °· If you will be washing dishes or performing other tasks that require your hands to get wet, wear rubber gloves. You should also wear gloves if your hands might come in contact with irritating substances, such as cleaners or chemicals. °· Follow your health care provider's instructions about: °¨ Wound care. °¨ Bandage (dressing) changes and removal. °SEEK MEDICAL CARE IF: °· Your symptoms get worse or do not improve with treatment. °· You have a fever or chills. °· You have redness spreading from the affected area. °· You have continued or increased fluid, blood, or pus coming from the affected area. °· Your finger or knuckle becomes swollen or is difficult to move. °  °  This information is not intended to replace advice given to you by your health care provider. Make sure you discuss any questions you have with your health care provider. °  °Document Released: 04/25/2001 Document Revised: 03/16/2015 Document Reviewed: 10/07/2014 °Elsevier Interactive Patient Education ©2016 Elsevier Inc. ° °

## 2016-07-21 LAB — ARTHRITIS PANEL
Basophils Absolute: 0 10*3/uL (ref 0.0–0.2)
Basos: 0 %
EOS (ABSOLUTE): 0 10*3/uL (ref 0.0–0.4)
Eos: 1 %
Hematocrit: 37.3 % — ABNORMAL LOW (ref 37.5–51.0)
Hemoglobin: 12.3 g/dL — ABNORMAL LOW (ref 12.6–17.7)
Immature Grans (Abs): 0 10*3/uL (ref 0.0–0.1)
Immature Granulocytes: 0 %
Lymphocytes Absolute: 2.2 10*3/uL (ref 0.7–3.1)
Lymphs: 34 %
MCH: 29.2 pg (ref 26.6–33.0)
MCHC: 33 g/dL (ref 31.5–35.7)
MCV: 89 fL (ref 79–97)
Monocytes Absolute: 0.7 10*3/uL (ref 0.1–0.9)
Monocytes: 11 %
Neutrophils Absolute: 3.5 10*3/uL (ref 1.4–7.0)
Neutrophils: 54 %
Platelets: 195 10*3/uL (ref 150–379)
RBC: 4.21 x10E6/uL (ref 4.14–5.80)
RDW: 15.8 % — ABNORMAL HIGH (ref 12.3–15.4)
Rhuematoid fact SerPl-aCnc: <10 IU/mL (ref 0.0–13.9)
Sed Rate: 34 mm/hr — ABNORMAL HIGH (ref 0–30)
Uric Acid: 7.3 mg/dL (ref 3.7–8.6)
WBC: 6.5 10*3/uL (ref 3.4–10.8)

## 2016-07-21 LAB — MICROALBUMIN / CREATININE URINE RATIO
Creatinine, Urine: 141.9 mg/dL
MICROALB/CREAT RATIO: 3149.3 mg/g creat — ABNORMAL HIGH (ref 0.0–30.0)
Microalbumin, Urine: 4468.9 ug/mL

## 2016-07-26 ENCOUNTER — Ambulatory Visit (INDEPENDENT_AMBULATORY_CARE_PROVIDER_SITE_OTHER): Payer: Medicare Other | Admitting: Cardiology

## 2016-07-26 ENCOUNTER — Encounter: Payer: Self-pay | Admitting: Cardiology

## 2016-07-26 VITALS — BP 148/60 | HR 68 | Ht 69.0 in | Wt 247.0 lb

## 2016-07-26 DIAGNOSIS — R0989 Other specified symptoms and signs involving the circulatory and respiratory systems: Secondary | ICD-10-CM

## 2016-07-26 DIAGNOSIS — I2510 Atherosclerotic heart disease of native coronary artery without angina pectoris: Secondary | ICD-10-CM

## 2016-07-26 DIAGNOSIS — I351 Nonrheumatic aortic (valve) insufficiency: Secondary | ICD-10-CM | POA: Diagnosis not present

## 2016-07-26 DIAGNOSIS — M7989 Other specified soft tissue disorders: Secondary | ICD-10-CM

## 2016-07-26 NOTE — Patient Instructions (Signed)
Medication Instructions:  The current medical regimen is effective;  continue present plan and medications.  Follow-Up: Follow up in 6 months with Dr. Hochrein.  You will receive a letter in the mail 2 months before you are due.  Please call us when you receive this letter to schedule your follow up appointment.  If you need a refill on your cardiac medications before your next appointment, please call your pharmacy.  Thank you for choosing [facility]!!       

## 2016-07-26 NOTE — Progress Notes (Signed)
Cardiology Office Note   Date:  07/26/2016   ID:  Michael Trevino, DOB [birth year], MRN [id number]  PCP:  Wardell Honour, MD  Cardiologist:   Minus Breeding, MD   Chief Complaint  Patient presents with  . Aortic Regurgitation     History of Present Illness: Michael Trevino is a 75 y.o. male who presents for follow up of CAD.  He was seen at [facility] some years ago for heart attack and he reports a blockage on the back part of his heart. I don't have these records at this point.  He hasn't been followed since 2010.  He did have a TEE in 2014 which showed mild to moderate AI.  After, my first visit with him I ordered an echo which showed moderate AI and a normal EF.  I was not able to find any recent [facility] records.  In particular there was no recent ischemia work up.  I did send him for perfusion study which demonstrated no evidence of ischemia. He returns for follow-up. Of note his ejection fraction was 52% by perfusion study.  Since I last saw him he has done well.  The patient denies any new symptoms such as chest discomfort, neck or arm discomfort. There has been no new shortness of breath, PND or orthopnea. There have been no reported palpitations, presyncope or syncope.  He is watching his diet and trying to exercise.  Past Medical History:  Diagnosis Date  . Arthritis   . Diabetes mellitus without complication ([place])   . Hyperlipidemia   . Hypertension   . MI (myocardial infarction) ([place]) 2000  . Renal disorder   . Sepsis ([place])   . Thyroid disease    hypothyroid    Past Surgical History:  Procedure Laterality Date  . TEE WITHOUT CARDIOVERSION N/A 02/26/2013   Procedure: TRANSESOPHAGEAL ECHOCARDIOGRAM (TEE);  Surgeon: Thayer Headings, MD;  Location: [facility] ENDOSCOPY;  Service: Cardiovascular;  Laterality: N/A;     Current Outpatient Prescriptions  Medication Sig Dispense Refill  . amLODipine (NORVASC) 10 MG tablet Take 10 mg by mouth daily.  1  . ascorbic acid (VITAMIN C) 500  MG tablet Take 500 mg by mouth daily.    Marland Kitchen aspirin 325 MG EC tablet Take 325 mg by mouth daily.    Marland Kitchen atorvastatin (LIPITOR) 40 MG tablet TAKE 1 TABLET (40 MG TOTAL) BY MOUTH DAILY. 90 tablet 1  . Calcium Carbonate 500 MG CHEW Chew 1 tablet (500 mg total) by mouth 3 (three) times daily as needed. (Patient taking differently: Chew 500 mg by mouth daily. ) 30 each   . cephALEXin (KEFLEX) 500 MG capsule Take 1 capsule (500 mg total) by mouth 3 (three) times daily. 30 capsule 0  . furosemide (LASIX) 40 MG tablet Take 1 tablet (40 mg total) by mouth 2 (two) times daily. 180 tablet 1  . glimepiride (AMARYL) 4 MG tablet TAKE 1 TABLET (4 MG TOTAL) BY MOUTH 2 (TWO) TIMES DAILY. 180 tablet 0  . Insulin Detemir (LEVEMIR FLEXPEN) 100 UNIT/ML Pen Inject 57 Units into the skin daily.  45 mL 2  . irbesartan (AVAPRO) 300 MG tablet TAKE 1 TABLET (300 MG TOTAL) BY MOUTH DAILY. 90 tablet 0  . levothyroxine (SYNTHROID, LEVOTHROID) 100 MCG tablet Take 100 mcg by mouth daily before breakfast.    . linagliptin (TRADJENTA) 5 MG TABS tablet Take 1 tablet (5 mg total) by mouth daily. 28 tablet 0  . metoprolol (LOPRESSOR) 100 MG tablet TAKE 1  TABLET (100 MG TOTAL) BY MOUTH 2 (TWO) TIMES DAILY. 180 tablet 0  . B-D INS SYR ULTRAFINE 1CC/31G 31G X 5/16" 1 ML MISC USE TO INJECT INSULIN DAILY. DX: 250.02 100 each 2  . B-D UF III MINI PEN NEEDLES 31G X 5 MM MISC USE DAILY 100 each 0  . glucose blood test strip Use to check blood glucose once daily.  Dx:  E11.9 100 each 3   No current facility-administered medications for this visit.     Allergies:   Zocor [simvastatin - high dose]    ROS:  Please see the history of present illness.   Otherwise, review of systems are positive for none.   All other systems are reviewed and negative.    PHYSICAL EXAM: VS:  BP (!) 148/60   Pulse 68   Ht 5\' 9"  (1.753 m)   Wt 247 lb (112 kg)   BMI 36.48 kg/m  , BMI Body mass index is 36.48 kg/m. GENERAL:  Well appearing NECK:  No jugular  venous distention, waveform within normal limits, carotid upstroke brisk and symmetric, transmitted systolic murmur versus bilateral  bruits, no thyromegaly LYMPHATICS:  No cervical, inguinal adenopathy LUNGS:  Clear to auscultation bilaterally CHEST:  Unremarkable HEART:  PMI not displaced or sustained,S1 and S2 within normal limits, no S3, no S4, no clicks, no rubs, 2 out of 6 apical systolic murmur radiating out the outflow tract, 2/6 diastolic murmur ABD:  Flat, positive bowel sounds normal in frequency in pitch, no bruits, no rebound, no guarding, no midline pulsatile mass, no hepatomegaly, no splenomegaly EXT:  2 plus pulses throughout, moderate bilateral lower edema, no cyanosis no clubbing SKIN:  No rashes no nodules   EKG:  EKG is not ordered today.    Recent Labs: 04/11/2016: TSH 7.360 04/27/2016: ALT 13 06/23/2016: BUN 17; Creatinine, Ser 1.71; Potassium 4.5; Sodium 142 07/20/2016: Platelets 195   Lab Results  Component Value Date   HGBA1C 9.0 01/13/2016    Lipid Panel    Component Value Date/Time   CHOL 122 04/27/2016 0945   CHOL 108 04/11/2013 1153   TRIG 122 04/27/2016 0945   TRIG 98 07/17/2013 0940   TRIG 131 04/11/2013 1153   HDL 27 (L) 04/27/2016 0945   HDL 31 (L) 07/17/2013 0940   HDL 33 (L) 04/11/2013 1153   CHOLHDL 4.5 04/27/2016 0945   LDLCALC 71 04/27/2016 0945   LDLCALC 49 07/17/2013 0940   LDLCALC 49 04/11/2013 1153     Lab Results  Component Value Date   TSH 7.360 (H) 04/11/2016    Wt Readings from Last 3 Encounters:  07/26/16 247 lb (112 kg)  07/20/16 250 lb (113.4 kg)  07/20/16 247 lb 8 oz (112.3 kg)      Other studies Reviewed: Additional studies/ records that were reviewed today include:  Carotid Lexiscan Myoview. Review of the above records demonstrates:  Please see elsewhere in the note.     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  I will follow this clinically and with repeat echo in one year  Type 2 diabetes  mellitus with stage 3 chronic kidney disease, with long-term current use of insulin ([place]):  A1c was 7.8 .  This is followed by Wardell Honour, MD.  LBBB:   No change in therapy.    Essential hypertension:  I reviewed his home blood pressure diary. We correlated his machine with ours. His blood pressures at home are fine and he has  a little white coat hypertension. No change in therapy is indicated.  Hyperlipidemia:  His cholesterol is actually well controlled. He's going to continue the meds as listed.  Fatigue:    This seems to be improved.   Edema:  We did talk about salt restriction.  Venous Dopplers were okay.  CAD:  He had no evidence of ischemia and will continue with risk reduction.  BRUIT:  He will get carotid Dopplers   Current medicines are reviewed at length with the patient today.  The patient does not have concerns regarding medicines.  The following changes have been made:  None  Labs/ tests ordered today include:   No orders of the defined types were placed in this encounter.    Disposition:   FU with me in six months.     Signed, Minus Breeding, MD  07/26/2016 11:51 AM    [place]

## 2016-08-02 ENCOUNTER — Ambulatory Visit: Payer: Self-pay | Admitting: Cardiology

## 2016-08-07 ENCOUNTER — Other Ambulatory Visit: Payer: Self-pay | Admitting: Pharmacist

## 2016-08-07 MED ORDER — LINAGLIPTIN 5 MG PO TABS: 5.0000 mg | ORAL_TABLET | Freq: Every day | ORAL | 1 refills | Status: DC

## 2016-08-09 ENCOUNTER — Telehealth: Payer: Self-pay | Admitting: Family Medicine

## 2016-08-09 NOTE — Telephone Encounter (Signed)
Called Havery Moros to get fax # so we can fax information

## 2016-08-30 ENCOUNTER — Ambulatory Visit (INDEPENDENT_AMBULATORY_CARE_PROVIDER_SITE_OTHER): Payer: Medicare Other

## 2016-08-30 DIAGNOSIS — Z23 Encounter for immunization: Secondary | ICD-10-CM

## 2016-08-31 ENCOUNTER — Other Ambulatory Visit: Payer: Self-pay | Admitting: Pharmacist

## 2016-08-31 MED ORDER — LINAGLIPTIN 5 MG PO TABS: 5.0000 mg | ORAL_TABLET | Freq: Every day | ORAL | 0 refills | Status: DC

## 2016-08-31 NOTE — Telephone Encounter (Signed)
Patient has not reached Medicare coverage gap and did not qualify for assistance from [facility] PAP.  Michael Trevino was able to get copay reduced to $45. He states he can afford this.  I also have #3 weeks of samples for patient.

## 2016-10-03 ENCOUNTER — Other Ambulatory Visit: Payer: Self-pay | Admitting: Family Medicine

## 2016-10-12 ENCOUNTER — Other Ambulatory Visit: Payer: Self-pay | Admitting: Family Medicine

## 2016-10-16 ENCOUNTER — Other Ambulatory Visit: Payer: Self-pay | Admitting: Family Medicine

## 2016-10-19 NOTE — Progress Notes (Signed)
Subjective:    Patient ID: Michael Trevino, male    DOB: Jun 26, 1941, 75 y.o.   MRN: [id number]  HPI 75 year old gentleman here to follow-up diabetes and blood pressure and hypothyroid. He reports good fasting blood sugars generally less than 1:30 but he has also had some times in the morning where he feels shaky. That could be the result of the sulfonylurea versus too much Levemir at bedtime. We talked about some strategies to help this problem. He also is worried about affordability of his [place] I will contact his insurer to see if there is another medicine in that class that is better covered. He has appointment with nephrology next week.  Patient Active Problem List   Diagnosis Date Noted  . Knee pain, right 03/09/2014  . Hip pain, right 03/09/2014  . Hypertension   . MI (myocardial infarction)   . Arthritis   . Erectile dysfunction 04/11/2013  . HLD (hyperlipidemia) 04/11/2013  . UTI (urinary tract infection) 02/25/2013  . Sepsis due to Streptococcus, group B ([place]) 02/24/2013  . Bacteremia due to group B Streptococcus 02/24/2013  . Rash 11/06/2012  . Fever 11/06/2012  . Cellulitis 11/05/2012  . DM (diabetes mellitus), type 2, uncontrolled, with renal complications ([place]) 33/00/7622  . HTN (hypertension) 02/08/2011  . Hyperlipidemia 02/08/2011  . Hypothyroid 02/08/2011  . CKD (chronic kidney disease) stage 3, GFR 30-59 ml/min 02/08/2011  . Illiterate 02/08/2011  . Impotence 02/08/2011  . CAD (coronary artery disease) 02/08/2011  . MI, old 11/29/1998   Outpatient Encounter Prescriptions as of 10/20/2016  Medication Sig  . amLODipine (NORVASC) 10 MG tablet TAKE 1 TABLET EVERY DAY  . furosemide (LASIX) 40 MG tablet Take 1 tablet (40 mg total) by mouth 2 (two) times daily.  Marland Kitchen glimepiride (AMARYL) 4 MG tablet TAKE 1 TABLET (4 MG TOTAL) BY MOUTH 2 (TWO) TIMES DAILY.  Marland Kitchen Insulin Detemir (LEVEMIR FLEXPEN) 100 UNIT/ML Pen Inject 57 Units into the skin daily.   . irbesartan (AVAPRO)  300 MG tablet TAKE 1 TABLET (300 MG TOTAL) BY MOUTH DAILY.  Marland Kitchen levothyroxine (SYNTHROID, LEVOTHROID) 100 MCG tablet Take 100 mcg by mouth daily before breakfast.  . linagliptin (TRADJENTA) 5 MG TABS tablet Take 1 tablet (5 mg total) by mouth daily.  . metoprolol (LOPRESSOR) 100 MG tablet TAKE 1 TABLET (100 MG TOTAL) BY MOUTH 2 (TWO) TIMES DAILY.  Marland Kitchen ascorbic acid (VITAMIN C) 500 MG tablet Take 500 mg by mouth daily.  Marland Kitchen aspirin 325 MG EC tablet Take 325 mg by mouth daily.  Marland Kitchen atorvastatin (LIPITOR) 40 MG tablet TAKE 1 TABLET (40 MG TOTAL) BY MOUTH DAILY.  Marland Kitchen B-D INS SYR ULTRAFINE 1CC/31G 31G X 5/16" 1 ML MISC USE TO INJECT INSULIN DAILY. DX: 250.02  . B-D UF III MINI PEN NEEDLES 31G X 5 MM MISC USE DAILY  . Calcium Carbonate 500 MG CHEW Chew 1 tablet (500 mg total) by mouth 3 (three) times daily as needed. (Patient taking differently: Chew 500 mg by mouth daily. )  . glucose blood test strip Use to check blood glucose once daily.  Dx:  E11.9  . [DISCONTINUED] amLODipine (NORVASC) 10 MG tablet Take 10 mg by mouth daily.  . [DISCONTINUED] cephALEXin (KEFLEX) 500 MG capsule Take 1 capsule (500 mg total) by mouth 3 (three) times daily.   No facility-administered encounter medications on file as of 10/20/2016.       Review of Systems  Constitutional: Negative.   Respiratory: Negative.   Cardiovascular: Negative.   Genitourinary:  Negative.   Neurological: Negative.   Psychiatric/Behavioral: Negative.        Objective:   Physical Exam  Constitutional: He is oriented to person, place, and time. He appears well-developed and well-nourished.  Cardiovascular: Normal rate, regular rhythm and normal heart sounds.   Pulmonary/Chest: Effort normal and breath sounds normal.  Neurological: He is alert and oriented to person, place, and time.  Psychiatric: He has a normal mood and affect. His behavior is normal.   BP (!) 148/57 (BP Location: Left Arm, Patient Position: Sitting, Cuff Size: Large)   Pulse  63   Temp 98.3 F (36.8 C) (Oral)   Ht 5\' 9"  (1.753 m)   Wt 252 lb 3.2 oz (114.4 kg)   BMI 37.24 kg/m         Assessment & Plan:  1. Uncontrolled type 2 diabetes mellitus with diabetic nephropathy, with long-term current use of insulin (HCC) Last A1c was 7.8. Anticipate improved number today - Bayer DCA Hb A1c Waived  2. Essential hypertension Sugars are okay on combination ARB and calcium channel blocker  3. Hypothyroidism, unspecified type Previous TSH was slightly elevated. Will repeat and perhaps adjust dose of replacement therapy upward. This may help in weight control, which is an issue with him  Wardell Honour MD - TSH

## 2016-10-20 ENCOUNTER — Ambulatory Visit (INDEPENDENT_AMBULATORY_CARE_PROVIDER_SITE_OTHER): Payer: Medicare Other | Admitting: Family Medicine

## 2016-10-20 ENCOUNTER — Encounter: Payer: Self-pay | Admitting: Family Medicine

## 2016-10-20 VITALS — BP 148/57 | HR 63 | Temp 98.3°F | Ht 69.0 in | Wt 252.2 lb

## 2016-10-20 DIAGNOSIS — E1165 Type 2 diabetes mellitus with hyperglycemia: Secondary | ICD-10-CM | POA: Diagnosis not present

## 2016-10-20 DIAGNOSIS — IMO0002 Reserved for concepts with insufficient information to code with codable children: Secondary | ICD-10-CM

## 2016-10-20 DIAGNOSIS — I10 Essential (primary) hypertension: Secondary | ICD-10-CM | POA: Diagnosis not present

## 2016-10-20 DIAGNOSIS — E039 Hypothyroidism, unspecified: Secondary | ICD-10-CM | POA: Diagnosis not present

## 2016-10-20 DIAGNOSIS — Z794 Long term (current) use of insulin: Secondary | ICD-10-CM | POA: Diagnosis not present

## 2016-10-20 DIAGNOSIS — E1121 Type 2 diabetes mellitus with diabetic nephropathy: Secondary | ICD-10-CM | POA: Diagnosis not present

## 2016-10-20 LAB — BAYER DCA HB A1C WAIVED: HB A1C (BAYER DCA - WAIVED): 8.1 % — ABNORMAL HIGH (ref <7.0)

## 2016-10-20 MED ORDER — ATORVASTATIN CALCIUM 40 MG PO TABS: ORAL_TABLET | ORAL | 1 refills | Status: DC

## 2016-10-20 MED ORDER — GLIMEPIRIDE 4 MG PO TABS: ORAL_TABLET | ORAL | 1 refills | Status: DC

## 2016-10-20 MED ORDER — METOPROLOL TARTRATE 100 MG PO TABS: ORAL_TABLET | ORAL | 1 refills | Status: DC

## 2016-10-20 MED ORDER — IRBESARTAN 300 MG PO TABS: ORAL_TABLET | ORAL | 1 refills | Status: DC

## 2016-10-20 MED ORDER — AMLODIPINE BESYLATE 10 MG PO TABS: 10.0000 mg | ORAL_TABLET | Freq: Every day | ORAL | 1 refills | Status: DC

## 2016-10-20 MED ORDER — FUROSEMIDE 40 MG PO TABS: 40.0000 mg | ORAL_TABLET | Freq: Two times a day (BID) | ORAL | 1 refills | Status: DC

## 2016-10-21 LAB — TSH: TSH: 9.51 u[IU]/mL — ABNORMAL HIGH (ref 0.450–4.500)

## 2016-10-23 DIAGNOSIS — N183 Chronic kidney disease, stage 3 (moderate): Secondary | ICD-10-CM | POA: Diagnosis not present

## 2016-10-23 DIAGNOSIS — N2581 Secondary hyperparathyroidism of renal origin: Secondary | ICD-10-CM | POA: Diagnosis not present

## 2016-10-23 DIAGNOSIS — E1129 Type 2 diabetes mellitus with other diabetic kidney complication: Secondary | ICD-10-CM | POA: Diagnosis not present

## 2016-10-23 DIAGNOSIS — I129 Hypertensive chronic kidney disease with stage 1 through stage 4 chronic kidney disease, or unspecified chronic kidney disease: Secondary | ICD-10-CM | POA: Diagnosis not present

## 2016-10-23 DIAGNOSIS — E785 Hyperlipidemia, unspecified: Secondary | ICD-10-CM | POA: Diagnosis not present

## 2016-11-02 ENCOUNTER — Encounter: Payer: Self-pay | Admitting: *Deleted

## 2016-12-15 ENCOUNTER — Other Ambulatory Visit: Payer: Self-pay | Admitting: Family Medicine

## 2017-01-16 ENCOUNTER — Other Ambulatory Visit: Payer: Self-pay | Admitting: Nurse Practitioner

## 2017-04-14 ENCOUNTER — Other Ambulatory Visit: Payer: Self-pay | Admitting: Family Medicine

## 2017-04-16 ENCOUNTER — Ambulatory Visit (INDEPENDENT_AMBULATORY_CARE_PROVIDER_SITE_OTHER): Payer: Medicare Other | Admitting: Family Medicine

## 2017-04-16 ENCOUNTER — Encounter: Payer: Self-pay | Admitting: Family Medicine

## 2017-04-16 VITALS — BP 141/62 | HR 63 | Temp 98.2°F | Ht 69.0 in | Wt 250.8 lb

## 2017-04-16 DIAGNOSIS — I10 Essential (primary) hypertension: Secondary | ICD-10-CM

## 2017-04-16 DIAGNOSIS — Z794 Long term (current) use of insulin: Secondary | ICD-10-CM

## 2017-04-16 DIAGNOSIS — IMO0002 Reserved for concepts with insufficient information to code with codable children: Secondary | ICD-10-CM

## 2017-04-16 DIAGNOSIS — E1165 Type 2 diabetes mellitus with hyperglycemia: Secondary | ICD-10-CM

## 2017-04-16 DIAGNOSIS — E039 Hypothyroidism, unspecified: Secondary | ICD-10-CM

## 2017-04-16 DIAGNOSIS — E1121 Type 2 diabetes mellitus with diabetic nephropathy: Secondary | ICD-10-CM | POA: Diagnosis not present

## 2017-04-16 LAB — BAYER DCA HB A1C WAIVED: HB A1C (BAYER DCA - WAIVED): 8.2 % — ABNORMAL HIGH (ref <7.0)

## 2017-04-16 MED ORDER — IRBESARTAN 300 MG PO TABS: ORAL_TABLET | ORAL | 3 refills | Status: DC

## 2017-04-16 MED ORDER — ATORVASTATIN CALCIUM 40 MG PO TABS: ORAL_TABLET | ORAL | 3 refills | Status: DC

## 2017-04-16 NOTE — Patient Instructions (Signed)
Great to see you!  Lets follow up in 4 month sunless you need [place] sooner.   Be sure to watch your diet as carefully as you can when it comes to diabetes.

## 2017-04-16 NOTE — Progress Notes (Signed)
   HPI  Patient presents today here to follow-up for chronic medical conditions and establish care, his previous PCP is retired.  Hypertension Average blood pressure at home is in 120s Good medication compliance No chest pain, dyspnea, orthopnea.  Diabetes Good medication compliance, can easily discuss insulin regimen No hypoglycemia Average fasting is 130-150  Hypothyroidism No fatigue, good medication compliance at the same time everyday.   PMH: Smoking status noted ROS: Per HPI  Objective: BP (!) 141/62   Pulse 63   Temp 98.2 F (36.8 C) (Oral)   Ht '5\' 9"'$  (1.753 m)   Wt 250 lb 12.8 oz (113.8 kg)   BMI 37.04 kg/m  Gen: NAD, alert, cooperative with exam HEENT: NCAT CV: RRR, good N8/T7, 2 to 3/6 systolic murmur Resp: CTABL, no wheezes, non-labored Ext: Trace to 1+ pitting edema Neuro: Alert and oriented, No gross deficits  Assessment and plan:  # Type 2 diabetes Uncontrolled previously, expect A1c to be similar. Continue current regimen Discussed more frequent follow-up as well as careful diet monitoring.  # Hypertension Reasonably well-controlled today given age. Controlled at home No changes.  # Hypothyroidism TSH previously uncontrolled, Synthroid was adjusted Repeat TSH today     Orders Placed This Encounter  Procedures  . Bayer DCA Hb A1c Waived  . CMP14+EGFR  . CBC with Differential/Platelet  . Lipid panel  . TSH    Meds ordered this encounter  Medications  . atorvastatin (LIPITOR) 40 MG tablet    Sig: TAKE 1 TABLET (40 MG TOTAL) BY MOUTH DAILY.    Dispense:  90 tablet    Refill:  3  . irbesartan (AVAPRO) 300 MG tablet    Sig: TAKE 1 TABLET (300 MG TOTAL) BY MOUTH DAILY.    Dispense:  90 tablet    Refill:  [place], MD [place] 04/16/2017, 8:34 AM

## 2017-04-17 ENCOUNTER — Other Ambulatory Visit: Payer: Self-pay | Admitting: Family Medicine

## 2017-04-17 LAB — CBC WITH DIFFERENTIAL/PLATELET
Basophils Absolute: 0 10*3/uL (ref 0.0–0.2)
Basos: 0 %
EOS (ABSOLUTE): 0.1 10*3/uL (ref 0.0–0.4)
Eos: 1 %
Hematocrit: 37.1 % — ABNORMAL LOW (ref 37.5–51.0)
Hemoglobin: 12.3 g/dL — ABNORMAL LOW (ref 13.0–17.7)
Immature Grans (Abs): 0 10*3/uL (ref 0.0–0.1)
Immature Granulocytes: 0 %
Lymphocytes Absolute: 2.5 10*3/uL (ref 0.7–3.1)
Lymphs: 54 %
MCH: 29.6 pg (ref 26.6–33.0)
MCHC: 33.2 g/dL (ref 31.5–35.7)
MCV: 89 fL (ref 79–97)
Monocytes Absolute: 0.4 10*3/uL (ref 0.1–0.9)
Monocytes: 8 %
Neutrophils Absolute: 1.7 10*3/uL (ref 1.4–7.0)
Neutrophils: 37 %
Platelets: 155 10*3/uL (ref 150–379)
RBC: 4.16 x10E6/uL (ref 4.14–5.80)
RDW: 15.3 % (ref 12.3–15.4)
WBC: 4.6 10*3/uL (ref 3.4–10.8)

## 2017-04-17 LAB — LIPID PANEL
Chol/HDL Ratio: 5.3 ratio — ABNORMAL HIGH (ref 0.0–5.0)
Cholesterol, Total: 127 mg/dL (ref 100–199)
HDL: 24 mg/dL — ABNORMAL LOW (ref >39)
LDL Calculated: 63 mg/dL (ref 0–99)
Triglycerides: 201 mg/dL — ABNORMAL HIGH (ref 0–149)
VLDL Cholesterol Cal: 40 mg/dL (ref 5–40)

## 2017-04-17 LAB — CMP14+EGFR
ALT: 15 IU/L (ref 0–44)
AST: 18 IU/L (ref 0–40)
Albumin/Globulin Ratio: 1.1 — ABNORMAL LOW (ref 1.2–2.2)
Albumin: 3.4 g/dL — ABNORMAL LOW (ref 3.5–4.8)
Alkaline Phosphatase: 86 IU/L (ref 39–117)
BUN/Creatinine Ratio: 13 (ref 10–24)
BUN: 26 mg/dL (ref 8–27)
Bilirubin Total: 0.2 mg/dL (ref 0.0–1.2)
CO2: 23 mmol/L (ref 18–29)
Calcium: 8.2 mg/dL — ABNORMAL LOW (ref 8.6–10.2)
Chloride: 106 mmol/L (ref 96–106)
Creatinine, Ser: 1.95 mg/dL — ABNORMAL HIGH (ref 0.76–1.27)
GFR calc Af Amer: 38 mL/min/{1.73_m2} — ABNORMAL LOW (ref >59)
GFR calc non Af Amer: 32 mL/min/{1.73_m2} — ABNORMAL LOW (ref >59)
Globulin, Total: 3.2 g/dL (ref 1.5–4.5)
Glucose: 151 mg/dL — ABNORMAL HIGH (ref 65–99)
Potassium: 4.3 mmol/L (ref 3.5–5.2)
Sodium: 142 mmol/L (ref 134–144)
Total Protein: 6.6 g/dL (ref 6.0–8.5)

## 2017-04-17 LAB — TSH: TSH: 7.64 u[IU]/mL — ABNORMAL HIGH (ref 0.450–4.500)

## 2017-05-01 NOTE — Progress Notes (Signed)
Cardiology Office Note   Date:  05/03/2017   ID:  Michael Trevino, DOB December 30, 1940, MRN [id number]  PCP:  Michael Euler, MD  Cardiologist:   Michael Breeding, MD   Chief Complaint  Patient presents with  . Aortic Insufficiency     History of Present Illness: Michael Trevino is a 76 y.o. male who presents for follow up of CAD.  He was seen at [facility] some years ago for heart attack and he reports a blockage on the back part of his heart. He did have a TEE in 2014 which showed mild to moderate AI.  This was moderate on echo last year.  He has normal LV size and function.  He had a negative perfusion study last year.      Since I last saw him he has done well.  The patient denies any new symptoms such as chest discomfort, neck or arm discomfort. There has been no new shortness of breath, PND or orthopnea. There have been no reported palpitations, presyncope or syncope.  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  He still has leg swelling.    Past Medical History:  Diagnosis Date  . Arthritis   . Diabetes mellitus without complication ([place])   . Hyperlipidemia   . Hypertension   . MI (myocardial infarction) ([place]) 2000  . Renal disorder   . Sepsis ([place])   . Sepsis due to Streptococcus, group B ([place]) 02/24/2013  . Thyroid disease    hypothyroid    Past Surgical History:  Procedure Laterality Date  . TEE WITHOUT CARDIOVERSION N/A 02/26/2013   Procedure: TRANSESOPHAGEAL ECHOCARDIOGRAM (TEE);  Surgeon: Thayer Headings, MD;  Location: [facility] ENDOSCOPY;  Service: Cardiovascular;  Laterality: N/A;     Current Outpatient Prescriptions  Medication Sig Dispense Refill  . amLODipine (NORVASC) 10 MG tablet Take 1 tablet (10 mg total) by mouth daily. 90 tablet 1  . ascorbic acid (VITAMIN C) 500 MG tablet Take 500 mg by mouth daily.    Marland Kitchen aspirin EC 81 MG tablet Take 81 mg by mouth daily.    Marland Kitchen atorvastatin (LIPITOR) 40 MG tablet TAKE 1 TABLET (40 MG TOTAL) BY MOUTH DAILY. 90 tablet 3   . B-D INS SYR ULTRAFINE 1CC/31G 31G X 5/16" 1 ML MISC USE TO INJECT INSULIN DAILY. DX: 250.02 100 each 2  . B-D UF III MINI PEN NEEDLES 31G X 5 MM MISC USE DAILY 100 each 0  . Calcium Carbonate 500 MG CHEW Chew 1 tablet (500 mg total) by mouth 3 (three) times daily as needed. (Patient taking differently: Chew 500 mg by mouth daily. ) 30 each   . furosemide (LASIX) 80 MG tablet Take 80 mg by mouth 2 (two) times daily.    Marland Kitchen glimepiride (AMARYL) 4 MG tablet TAKE 1 TABLET (4 MG TOTAL) BY MOUTH 2 (TWO) TIMES DAILY. 180 tablet 1  . glucose blood test strip Use to check blood glucose once daily.  Dx:  E11.9 100 each 3  . Insulin Detemir (LEVEMIR FLEXPEN) 100 UNIT/ML Pen Inject 57 Units into the skin daily.  45 mL 2  . irbesartan (AVAPRO) 300 MG tablet TAKE 1 TABLET (300 MG TOTAL) BY MOUTH DAILY. 90 tablet 3  . levothyroxine (SYNTHROID, LEVOTHROID) 100 MCG tablet TAKE 1 TABLET (100 MCG TOTAL) BY MOUTH DAILY BEFORE BREAKFAST. 90 tablet 2  . linagliptin (TRADJENTA) 5 MG TABS tablet Take 1 tablet (5 mg total) by mouth daily. 21 tablet 0  .  metoprolol (LOPRESSOR) 100 MG tablet TAKE 1 TABLET (100 MG TOTAL) BY MOUTH 2 (TWO) TIMES DAILY. 180 tablet 1   No current facility-administered medications for this visit.     Allergies:   Zocor [simvastatin - high dose]    ROS:  Please see the history of present illness.   Otherwise, review of systems are positive for none.   All other systems are reviewed and negative.    PHYSICAL EXAM: VS:  BP (!) 130/58   Pulse 69   Ht 5\' 9"  (1.753 m)   Wt 251 lb 6.4 oz (114 kg)   SpO2 97%   BMI 37.13 kg/m  , BMI Body mass index is 37.13 kg/m.  GENERAL:  Well appearing NECK:  No jugular venous distention, waveform within normal limits, carotid upstroke brisk and symmetric, bilateral bruits, no thyromegaly LUNGS:  Clear to auscultation bilaterally CHEST:  Unremarkable HEART:  PMI not displaced or sustained,S1 and S2 within normal limits, no S3, no S4, no clicks, no  rubs, 3 out of 6 apical systolic murmur radiating slightly out the aortic outflow tract,6 diastolic murmur heard at the left third intercostal space ABD:  Flat, positive bowel sounds normal in frequency in pitch, no bruits, no rebound, no guarding, no midline pulsatile mass, no hepatomegaly, no splenomegaly EXT:  2 plus pulses throughout, no edema, no cyanosis no clubbing   EKG:  EKG is  not ordered today.    Recent Labs: 04/16/2017: ALT 15; BUN 26; Creatinine, Ser 1.95; Hemoglobin 12.3; Platelets 155; Potassium 4.3; Sodium 142; TSH 7.640   Lab Results  Component Value Date   HGBA1C 9.0 01/13/2016    Lipid Panel    Component Value Date/Time   CHOL 127 04/16/2017 0840   CHOL 108 04/11/2013 1153   TRIG 201 (H) 04/16/2017 0840   TRIG 98 07/17/2013 0940   TRIG 131 04/11/2013 1153   HDL 24 (L) 04/16/2017 0840   HDL 31 (L) 07/17/2013 0940   HDL 33 (L) 04/11/2013 1153   CHOLHDL 5.3 (H) 04/16/2017 0840   LDLCALC 63 04/16/2017 0840   LDLCALC 49 07/17/2013 0940   LDLCALC 49 04/11/2013 1153     Lab Results  Component Value Date   TSH 7.640 (H) 04/16/2017    Wt Readings from Last 3 Encounters:  05/02/17 251 lb 6.4 oz (114 kg)  04/16/17 250 lb 12.8 oz (113.8 kg)  10/20/16 252 lb 3.2 oz (114.4 kg)      Other studies Reviewed: Additional studies/ records that were reviewed today include:  None Review of the above records demonstrates:     ASSESSMENT AND PLAN:  AI:  This was moderate by echo. I will repeat an echo in August.  He has had normal LV size and function.   Type 2 diabetes mellitus with stage 3 chronic kidney disease, with long-term current use of insulin ([place]):    A1c was 8.2 .  This is not as well controlled as previous and I discussed this with him. This is followed by Michael Euler, MD.  LBBB:     No change in therapy.    Essential hypertension:   The blood pressure is at target. No change in medications is indicated. We will continue with therapeutic  lifestyle changes ([facility]).  Hyperlipidemia:    LDL was 63 this year.  He will continue meds as listed.    Edema:  Venous Dopplers were okay.  This could be related to his Norvasc.  However, I would like to continue  this.    CAD:   He had a negative perfusion study last year.    He had no evidence of ischemia and will continue with risk reduction.  BRUIT:  He had no significant plaque last year.  No further imaging is planned at this point.    Current medicines are reviewed at length with the patient today.  The patient does not have concerns regarding medicines.  The following changes have been made:  None  Labs/ tests ordered today include:   Orders Placed This Encounter  Procedures  . ECHOCARDIOGRAM COMPLETE     Disposition:   FU with me in six months.     Signed, Michael Breeding, MD  05/03/2017 12:45 PM    [facility]

## 2017-05-02 ENCOUNTER — Ambulatory Visit (INDEPENDENT_AMBULATORY_CARE_PROVIDER_SITE_OTHER): Payer: Medicare Other | Admitting: Cardiology

## 2017-05-02 ENCOUNTER — Encounter: Payer: Self-pay | Admitting: Cardiology

## 2017-05-02 ENCOUNTER — Telehealth: Payer: Self-pay | Admitting: Cardiology

## 2017-05-02 VITALS — BP 130/58 | HR 69 | Ht 69.0 in | Wt 251.4 lb

## 2017-05-02 DIAGNOSIS — I10 Essential (primary) hypertension: Secondary | ICD-10-CM

## 2017-05-02 DIAGNOSIS — I2510 Atherosclerotic heart disease of native coronary artery without angina pectoris: Secondary | ICD-10-CM

## 2017-05-02 DIAGNOSIS — Z794 Long term (current) use of insulin: Secondary | ICD-10-CM | POA: Diagnosis not present

## 2017-05-02 DIAGNOSIS — E118 Type 2 diabetes mellitus with unspecified complications: Secondary | ICD-10-CM

## 2017-05-02 DIAGNOSIS — E785 Hyperlipidemia, unspecified: Secondary | ICD-10-CM | POA: Diagnosis not present

## 2017-05-02 DIAGNOSIS — I351 Nonrheumatic aortic (valve) insufficiency: Secondary | ICD-10-CM | POA: Diagnosis not present

## 2017-05-02 NOTE — Telephone Encounter (Signed)
Pre-cert Verification for the following procedure    Echo set for 07-05-17 at [facility]

## 2017-05-02 NOTE — Patient Instructions (Signed)

## 2017-05-03 ENCOUNTER — Encounter: Payer: Self-pay | Admitting: Cardiology

## 2017-05-03 DIAGNOSIS — I351 Nonrheumatic aortic (valve) insufficiency: Secondary | ICD-10-CM | POA: Insufficient documentation

## 2017-05-03 DIAGNOSIS — E1159 Type 2 diabetes mellitus with other circulatory complications: Secondary | ICD-10-CM

## 2017-05-03 DIAGNOSIS — IMO0002 Reserved for concepts with insufficient information to code with codable children: Secondary | ICD-10-CM | POA: Insufficient documentation

## 2017-05-03 DIAGNOSIS — E1122 Type 2 diabetes mellitus with diabetic chronic kidney disease: Secondary | ICD-10-CM | POA: Insufficient documentation

## 2017-06-07 DIAGNOSIS — E1129 Type 2 diabetes mellitus with other diabetic kidney complication: Secondary | ICD-10-CM | POA: Diagnosis not present

## 2017-06-07 DIAGNOSIS — N183 Chronic kidney disease, stage 3 (moderate): Secondary | ICD-10-CM | POA: Diagnosis not present

## 2017-06-07 DIAGNOSIS — I129 Hypertensive chronic kidney disease with stage 1 through stage 4 chronic kidney disease, or unspecified chronic kidney disease: Secondary | ICD-10-CM | POA: Diagnosis not present

## 2017-06-07 DIAGNOSIS — E785 Hyperlipidemia, unspecified: Secondary | ICD-10-CM | POA: Diagnosis not present

## 2017-06-07 DIAGNOSIS — N2581 Secondary hyperparathyroidism of renal origin: Secondary | ICD-10-CM | POA: Diagnosis not present

## 2017-06-30 ENCOUNTER — Other Ambulatory Visit: Payer: Self-pay | Admitting: Family Medicine

## 2017-07-02 ENCOUNTER — Other Ambulatory Visit: Payer: Self-pay | Admitting: Family Medicine

## 2017-07-05 ENCOUNTER — Other Ambulatory Visit: Payer: Self-pay

## 2017-07-05 ENCOUNTER — Ambulatory Visit (INDEPENDENT_AMBULATORY_CARE_PROVIDER_SITE_OTHER): Payer: Medicare Other

## 2017-07-05 DIAGNOSIS — I10 Essential (primary) hypertension: Secondary | ICD-10-CM

## 2017-07-07 ENCOUNTER — Other Ambulatory Visit: Payer: Self-pay | Admitting: Family Medicine

## 2017-07-13 ENCOUNTER — Telehealth: Payer: Self-pay

## 2017-07-13 DIAGNOSIS — I10 Essential (primary) hypertension: Secondary | ICD-10-CM

## 2017-07-13 NOTE — Telephone Encounter (Signed)
Patient notified. Routed to PCP. Order placed for repeat echo in 1 year

## 2017-07-13 NOTE — Telephone Encounter (Signed)
-----   Message from Vennie Homans sent at 07/13/2017 11:43 AM EDT -----   ----- Message ----- From: Minus Breeding, MD Sent: 07/10/2017   2:36 PM To: Vennie Homans  Moderate stenosis and regurgitation.  I will follow up with this in one year with an echo.  Please order.  Call Mr. Howatt with the results and send results to Timmothy Euler, MD

## 2017-08-16 ENCOUNTER — Ambulatory Visit (INDEPENDENT_AMBULATORY_CARE_PROVIDER_SITE_OTHER): Payer: Medicare Other | Admitting: Family Medicine

## 2017-08-16 ENCOUNTER — Encounter: Payer: Self-pay | Admitting: Family Medicine

## 2017-08-16 VITALS — BP 159/64 | HR 62 | Temp 97.1°F | Ht 69.0 in | Wt 254.6 lb

## 2017-08-16 DIAGNOSIS — E1129 Type 2 diabetes mellitus with other diabetic kidney complication: Secondary | ICD-10-CM

## 2017-08-16 DIAGNOSIS — Z23 Encounter for immunization: Secondary | ICD-10-CM | POA: Diagnosis not present

## 2017-08-16 DIAGNOSIS — E118 Type 2 diabetes mellitus with unspecified complications: Secondary | ICD-10-CM

## 2017-08-16 DIAGNOSIS — Z794 Long term (current) use of insulin: Secondary | ICD-10-CM | POA: Diagnosis not present

## 2017-08-16 DIAGNOSIS — I10 Essential (primary) hypertension: Secondary | ICD-10-CM

## 2017-08-16 DIAGNOSIS — IMO0002 Reserved for concepts with insufficient information to code with codable children: Secondary | ICD-10-CM

## 2017-08-16 DIAGNOSIS — E039 Hypothyroidism, unspecified: Secondary | ICD-10-CM | POA: Diagnosis not present

## 2017-08-16 DIAGNOSIS — E1165 Type 2 diabetes mellitus with hyperglycemia: Secondary | ICD-10-CM

## 2017-08-16 LAB — BAYER DCA HB A1C WAIVED: HB A1C (BAYER DCA - WAIVED): 8.8 % — ABNORMAL HIGH (ref <7.0)

## 2017-08-16 LAB — HEMOGLOBIN A1C: Hemoglobin A1C: 8.8

## 2017-08-16 MED ORDER — AMLODIPINE BESYLATE 10 MG PO TABS: 10.0000 mg | ORAL_TABLET | Freq: Every day | ORAL | 3 refills | Status: DC

## 2017-08-16 NOTE — Progress Notes (Signed)
   HPI  Patient presents today to follow-up for chronic medical conditions.  Hypertension Good medication compliance, no chest pain or dyspnea but is unusual. Blood pressure at home ranges 1 extended 335 systolic, mostly in the 456Y.  Diabetes Good medication compliance, he can easily explain his regimen. No hypoglycemia. Watching diet Recreational active.  Patient would like an influenza vaccine today.  Hypothyroidism He did begin to take thyroid medication 30 minutes apart from a relative medication   PMH: Smoking status noted ROS: Per HPI  Objective: BP (!) 159/64   Pulse 62   Temp (!) 97.1 F (36.2 C) (Oral)   Ht 5\' 9"  (1.753 m)   Wt 254 lb 9.6 oz (115.5 kg)   BMI 37.60 kg/m  Gen: NAD, alert, cooperative with exam HEENT: NCAT CV: RRR, good S1/S2, no murmur Resp: CTABL, no wheezes, non-labored Ext: 1-2+ pitting edema bilateral lower extremities Neuro: Alert and oriented, No gross deficits Diabetic Foot Exam - Simple   Simple Foot Form Diabetic Foot exam was performed with the following findings:  Yes 08/16/2017  8:28 AM  Visual Inspection No deformities, no ulcerations, no other skin breakdown bilaterally:  Yes Sensation Testing Intact to touch and monofilament testing bilaterally:  Yes Pulse Check See comments:  Yes Comments Difficult to assess pulses given edema today, patient with 1-2+ pitting edema in the feet.       Assessment and plan:  # Type 2 diabetes I hope that he has had good A1c improvement, previously 8.2 and 8.1. He has good medication compliance. Difficult to clearly see another easy medication to add. A1c pending, stable according to his reported blood sugars  # Hypertension Elevated today Patient is intermittently elevated, normal at his last appointment. No changes for now Normal at home  # Hypothyroidism Asymptomatic Repeat labs, may need to titrate  Need for influenza vaccine-counseling provided for all vaccine  components     Orders Placed This Encounter  Procedures  . Bayer DCA Hb A1c Waived  . TSH    Meds ordered this encounter  Medications  . amLODipine (NORVASC) 10 MG tablet    Sig: Take 1 tablet (10 mg total) by mouth daily.    Dispense:  90 tablet    Refill:  [place], MD [place] 08/16/2017, 8:31 AM

## 2017-08-16 NOTE — Patient Instructions (Signed)
Great to see you!  Come back in 4 months.   We will let you know about your thyroid medication dosing.

## 2017-08-17 LAB — TSH: TSH: 8.04 u[IU]/mL — ABNORMAL HIGH (ref 0.450–4.500)

## 2017-08-20 ENCOUNTER — Other Ambulatory Visit: Payer: Self-pay | Admitting: Family Medicine

## 2017-08-20 MED ORDER — LEVOTHYROXINE SODIUM 112 MCG PO TABS: 112.0000 ug | ORAL_TABLET | Freq: Every day | ORAL | 1 refills | Status: DC

## 2017-08-20 NOTE — Addendum Note (Signed)
Addended by: Karle Plumber on: 08/20/2017 04:35 PM   Modules accepted: Orders

## 2017-08-26 ENCOUNTER — Other Ambulatory Visit: Payer: Self-pay | Admitting: Family Medicine

## 2017-09-11 ENCOUNTER — Encounter: Payer: Self-pay | Admitting: "Endocrinology

## 2017-09-11 ENCOUNTER — Ambulatory Visit (INDEPENDENT_AMBULATORY_CARE_PROVIDER_SITE_OTHER): Payer: Medicare Other | Admitting: "Endocrinology

## 2017-09-11 VITALS — BP 160/63 | HR 61 | Wt 251.0 lb

## 2017-09-11 DIAGNOSIS — E782 Mixed hyperlipidemia: Secondary | ICD-10-CM

## 2017-09-11 DIAGNOSIS — I10 Essential (primary) hypertension: Secondary | ICD-10-CM | POA: Insufficient documentation

## 2017-09-11 DIAGNOSIS — E039 Hypothyroidism, unspecified: Secondary | ICD-10-CM

## 2017-09-11 DIAGNOSIS — Z6837 Body mass index (BMI) 37.0-37.9, adult: Secondary | ICD-10-CM | POA: Diagnosis not present

## 2017-09-11 DIAGNOSIS — E1159 Type 2 diabetes mellitus with other circulatory complications: Secondary | ICD-10-CM | POA: Diagnosis not present

## 2017-09-11 MED ORDER — GLIMEPIRIDE 2 MG PO TABS: 2.0000 mg | ORAL_TABLET | Freq: Every day | ORAL | 3 refills | Status: DC

## 2017-09-11 MED ORDER — LEVOTHYROXINE SODIUM 125 MCG PO TABS: 125.0000 ug | ORAL_TABLET | Freq: Every day | ORAL | 3 refills | Status: DC

## 2017-09-11 NOTE — Patient Instructions (Signed)

## 2017-09-11 NOTE — Progress Notes (Signed)
Consult Note       09/11/2017, 9:10 AM   Subjective:    Patient ID: Michael Trevino, male    DOB: 31-Jul-1941.  he is being seen in consultation for management of currently uncontrolled symptomatic diabetes requested by  Timmothy Euler, MD.   Past Medical History:  Diagnosis Date  . Arthritis   . Diabetes mellitus without complication ([place])   . Hyperlipidemia   . Hypertension   . MI (myocardial infarction) ([place]) 2000  . Renal disorder   . Sepsis ([place])   . Sepsis due to Streptococcus, group B ([place]) 02/24/2013  . Thyroid disease    hypothyroid   Past Surgical History:  Procedure Laterality Date  . TEE WITHOUT CARDIOVERSION N/A 02/26/2013   Procedure: TRANSESOPHAGEAL ECHOCARDIOGRAM (TEE);  Surgeon: Thayer Headings, MD;  Location: [facility] ENDOSCOPY;  Service: Cardiovascular;  Laterality: N/A;   Social History   Social History  . Marital status: Married    Spouse name: N/A  . Number of children: 6  . Years of education: N/A   Social History Main Topics  . Smoking status: Former Smoker    Packs/day: 0.50    Years: 30.00    Types: Cigarettes    Quit date: 11/05/1984  . Smokeless tobacco: Never Used  . Alcohol use No  . Drug use: No  . Sexual activity: Yes   Other Topics Concern  . Not on file   Social History Narrative  . No narrative on file   Outpatient Encounter Prescriptions as of 09/11/2017  Medication Sig  . amLODipine (NORVASC) 10 MG tablet Take 1 tablet (10 mg total) by mouth daily.  Marland Kitchen ascorbic acid (VITAMIN C) 500 MG tablet Take 500 mg by mouth daily.  Marland Kitchen aspirin EC 81 MG tablet Take 81 mg by mouth daily.  Marland Kitchen atorvastatin (LIPITOR) 40 MG tablet TAKE 1 TABLET (40 MG TOTAL) BY MOUTH DAILY.  Marland Kitchen B-D INS SYR ULTRAFINE 1CC/31G 31G X 5/16" 1 ML MISC USE TO INJECT INSULIN DAILY. DX: 250.02  . B-D UF III MINI PEN NEEDLES 31G X 5 MM MISC USE DAILY  . Calcium Carbonate 500 MG CHEW Chew 1 tablet  (500 mg total) by mouth 3 (three) times daily as needed. (Patient taking differently: Chew 500 mg by mouth daily. )  . furosemide (LASIX) 80 MG tablet Take 80 mg by mouth 2 (two) times daily.  Marland Kitchen glimepiride (AMARYL) 2 MG tablet Take 1 tablet (2 mg total) by mouth daily with breakfast.  . glucose blood test strip Use to check blood glucose once daily.  Dx:  E11.9  . Insulin Detemir (LEVEMIR FLEXPEN) 100 UNIT/ML Pen Inject 30 Units into the skin at bedtime.  . irbesartan (AVAPRO) 300 MG tablet TAKE 1 TABLET (300 MG TOTAL) BY MOUTH DAILY.  Marland Kitchen levothyroxine (SYNTHROID, LEVOTHROID) 125 MCG tablet Take 1 tablet (125 mcg total) by mouth daily before breakfast.  . linagliptin (TRADJENTA) 5 MG TABS tablet Take 1 tablet (5 mg total) by mouth daily.  . metoprolol tartrate (LOPRESSOR) 100 MG tablet TAKE 1 TABLET (100 MG TOTAL) BY MOUTH 2 (TWO) TIMES DAILY.  . [DISCONTINUED] glimepiride (AMARYL)  4 MG tablet TAKE 1 TABLET (4 MG TOTAL) BY MOUTH 2 (TWO) TIMES DAILY.  . [DISCONTINUED] levothyroxine (SYNTHROID, LEVOTHROID) 112 MCG tablet Take 1 tablet (112 mcg total) by mouth daily before breakfast.   No facility-administered encounter medications on file as of 09/11/2017.     ALLERGIES: Allergies  Allergen Reactions  . Zocor [Simvastatin - High Dose] Nausea Only    Unknown    VACCINATION STATUS: Immunization History  Administered Date(s) Administered  . Influenza Whole 08/25/2008  . Influenza, High Dose Seasonal PF 08/30/2016, 08/16/2017  . Influenza,inj,Quad PF,6+ Mos 08/19/2014, 08/25/2015  . Pneumococcal Conjugate-13 04/06/2015  . Pneumococcal Polysaccharide-23 10/06/2005, 12/12/2007    Diabetes  He presents for his initial diabetic visit. He has type 2 diabetes mellitus. Onset time: He was diagnosed at approximate age of 65 years. His disease course has been worsening. There are no hypoglycemic associated symptoms. Pertinent negatives for hypoglycemia include no confusion, headaches, pallor or  seizures. Associated symptoms include blurred vision, polydipsia and polyuria. Pertinent negatives for diabetes include no chest pain, no fatigue, no polyphagia and no weakness. There are no hypoglycemic complications. Symptoms are worsening. Diabetic complications include heart disease and nephropathy. Risk factors for coronary artery disease include diabetes mellitus, dyslipidemia, family history, hypertension, male sex, obesity, sedentary lifestyle and tobacco exposure. Current diabetic treatment includes insulin injections and oral agent (dual therapy). His weight is stable. He is following a generally unhealthy diet. When asked about meal planning, he reported none. He has not had a previous visit with a dietitian. He never participates in exercise. (He did not bring any meter nor logs to review today. He admits that he does not monitor blood glucose regularly.) An ACE inhibitor/angiotensin II receptor blocker is being taken. He does not see a podiatrist.Eye exam is current.  Hyperlipidemia  This is a chronic problem. The current episode started more than 1 year ago. Exacerbating diseases include diabetes, hypothyroidism and obesity. Pertinent negatives include no chest pain, myalgias or shortness of breath. Current antihyperlipidemic treatment includes statins. Risk factors for coronary artery disease include diabetes mellitus, dyslipidemia, hypertension, male sex, family history, obesity and a sedentary lifestyle.  Hypertension  This is a chronic problem. The current episode started more than 1 year ago. The problem is uncontrolled. Associated symptoms include blurred vision. Pertinent negatives include no chest pain, headaches, neck pain, palpitations or shortness of breath. Risk factors for coronary artery disease include dyslipidemia, diabetes mellitus, family history, male gender, obesity, sedentary lifestyle and smoking/tobacco exposure. Past treatments include angiotensin blockers and calcium  channel blockers.      Review of Systems  Constitutional: Negative for chills, fatigue, fever and unexpected weight change.  HENT: Negative for dental problem, mouth sores and trouble swallowing.   Eyes: Positive for blurred vision. Negative for visual disturbance.  Respiratory: Negative for cough, choking, chest tightness, shortness of breath and wheezing.   Cardiovascular: Negative for chest pain, palpitations and leg swelling.  Gastrointestinal: Negative for abdominal distention, abdominal pain, constipation, diarrhea, nausea and vomiting.  Endocrine: Positive for polydipsia and polyuria. Negative for polyphagia.  Genitourinary: Negative for dysuria, flank pain, hematuria and urgency.  Musculoskeletal: Negative for back pain, gait problem, myalgias and neck pain.  Skin: Negative for pallor, rash and wound.  Neurological: Negative for seizures, syncope, weakness, numbness and headaches.  Psychiatric/Behavioral: Negative for confusion and dysphoric mood.    Objective:    BP (!) 160/63   Pulse 61   Wt 251 lb (113.9 kg)   BMI 37.07 kg/m  Wt Readings from Last 3 Encounters:  09/11/17 251 lb (113.9 kg)  08/16/17 254 lb 9.6 oz (115.5 kg)  05/02/17 251 lb 6.4 oz (114 kg)     Physical Exam  Constitutional: He is oriented to person, place, and time. He appears well-developed. He is cooperative. No distress.  Patient is unable to read and write  HENT:  Head: Normocephalic and atraumatic.  Eyes: EOM are normal.  Neck: Normal range of motion. Neck supple. No tracheal deviation present. No thyromegaly present.  Cardiovascular: Normal rate, S1 normal, S2 normal and normal heart sounds.  Exam reveals no gallop.   No murmur heard. Pulses:      Dorsalis pedis pulses are 1+ on the right side, and 1+ on the left side.       Posterior tibial pulses are 1+ on the right side, and 1+ on the left side.  Pulmonary/Chest: Breath sounds normal. No respiratory distress. He has no wheezes.   Abdominal: Soft. Bowel sounds are normal. He exhibits no distension. There is no tenderness. There is no guarding and no CVA tenderness.  Musculoskeletal: He exhibits no edema.       Right shoulder: He exhibits no swelling and no deformity.  Neurological: He is alert and oriented to person, place, and time. He has normal strength and normal reflexes. No cranial nerve deficit or sensory deficit. Gait normal.  Skin: Skin is warm and dry. No rash noted. No cyanosis. Nails show no clubbing.  Psychiatric: He has a normal mood and affect. His speech is normal. Cognition and memory are normal.  Patient is illiterate.   CMP     Component Value Date/Time   NA 142 04/16/2017 0840   K 4.3 04/16/2017 0840   CL 106 04/16/2017 0840   CO2 23 04/16/2017 0840   GLUCOSE 151 (H) 04/16/2017 0840   GLUCOSE 161 (H) 02/27/2013 0750   BUN 26 04/16/2017 0840   CREATININE 1.95 (H) 04/16/2017 0840   CALCIUM 8.2 (L) 04/16/2017 0840   PROT 6.6 04/16/2017 0840   ALBUMIN 3.4 (L) 04/16/2017 0840   AST 18 04/16/2017 0840   ALT 15 04/16/2017 0840   ALKPHOS 86 04/16/2017 0840   BILITOT 0.2 04/16/2017 0840   GFRNONAA 32 (L) 04/16/2017 0840   GFRAA 38 (L) 04/16/2017 0840     Diabetic Labs (most recent): Lab Results  Component Value Date   HGBA1C 8.8 08/16/2017   HGBA1C 9.0 01/13/2016   HGBA1C 7.8 07/14/2015     Lipid Panel ( most recent) Lipid Panel     Component Value Date/Time   CHOL 127 04/16/2017 0840   CHOL 108 04/11/2013 1153   TRIG 201 (H) 04/16/2017 0840   TRIG 98 07/17/2013 0940   TRIG 131 04/11/2013 1153   HDL 24 (L) 04/16/2017 0840   HDL 31 (L) 07/17/2013 0940   HDL 33 (L) 04/11/2013 1153   CHOLHDL 5.3 (H) 04/16/2017 0840   LDLCALC 63 04/16/2017 0840   LDLCALC 49 07/17/2013 0940   LDLCALC 49 04/11/2013 1153      Lab Results  Component Value Date   TSH 8.040 (H) 08/16/2017   TSH 7.640 (H) 04/16/2017   TSH 9.510 (H) 10/20/2016   TSH 7.360 (H) 04/11/2016   TSH 8.300 (H)  01/10/2016   TSH 3.080 07/14/2015   TSH 4.550 (H) 04/06/2015   TSH 3.150 02/06/2014   TSH 3.160 10/30/2013   TSH 3.710 07/17/2013           Assessment & Plan:  1. DM type 2 causing vascular disease , Stage 3 renal insufficiency.  - Patient has currently uncontrolled symptomatic type 2 DM since  76 years of age,  with most recent A1c of 8.8 %. Recent labs reviewed.  -his diabetes is complicated by coronary artery disease, stage 3 renal insufficiency, obesity/sedentary life, illiteracy  and Ayo Smoak remains at a high risk for more acute and chronic complications which include CAD, CVA, CKD, retinopathy, and neuropathy. These are all discussed in detail with the patient.  - I have counseled him on diet management and weight loss, by adopting a carbohydrate restricted/protein rich diet.  - Suggestion is made for him to avoid simple carbohydrates  from his diet including Cakes, Sweet Desserts, Ice Cream, Soda (diet and regular), Sweet Tea, Candies, Chips, Cookies, Store Bought Juices, Alcohol in Excess of  1-2 drinks a day, Artificial Sweeteners, and "Sugar-free" Products. This will help patient to have stable blood glucose profile and potentially avoid unintended weight gain.  - I encouraged him to switch to  unprocessed or minimally processed complex starch and increased protein intake (animal or plant source), fruits, and vegetables.  - he is advised to stick to a routine mealtimes to eat 3 meals  a day and avoid unnecessary snacks ( to snack only to correct hypoglycemia).   - he will be scheduled with Jearld Fenton, RDN, CDE for individualized diabetes education.  - I have approached him with the following individualized plan to manage diabetes and patient agrees:   - Given his current glycemic burden, he may need intensive treatment with basal/bolus insulin, however, due to his illiteracy it is difficult  for him to follow recommendations. - His elderly wife , [name],  who can  read and write,  is offering a limited help. - The #1 goal in his diabetes management will still be to avoid hypoglycemia.  - I will attempt to keep treatment simple for them to follow. -I advised for him to lower his basal insulin Levemir to 30 units daily at bedtime, initiate strict monitoring of glucose 4 times a day-before meals and at bedtime, and return in one week with his meter and logs to review. - Patient is warned not to take insulin without proper monitoring per orders.  -Patient is encouraged to call clinic for blood glucose levels less than 70 or above 300 mg /dl. - I will continue Tradjenta 5 mg by mouth every morningrapeutically suitable for patient . -Patient is not a candidate for metformin and insert inhibitors due to CKD. - For lack of better choices, I continued his glimepiride at lower dose of 2 mg daily with breakfast.   - he will be considered for incretin therapy as appropriate next visit. - Patient specific target  A1c;  LDL, HDL, Triglycerides, and  Waist Circumference were discussed in detail.  2) BP/HTN: uncontrolled . Continue current medications including ACEI/ARB. 3) Lipids/HPL : controlled.  Patient is advised to continue statins. 4)  Weight/Diet: CDE Consult will be initiated , exercise, and detailed carbohydrates information provided.  5) hypothyroidism: Long-standing diagnosis - Patient's recent thyroid function tests are consistent with inadequate replacement. I discussed and increased his levothyroxine to 125 g by mouth every morning.  - We discussed about correct intake of levothyroxine, at fasting, with water, separated by at least 30 minutes from breakfast, and separated by more than 4 hours from calcium, iron, multivitamins, acid reflux medications (PPIs). -Patient is made aware of the fact that thyroid hormone replacement is needed for  life, dose to be adjusted by periodic monitoring of thyroid function tests.  6) Chronic Care/Health  Maintenance:  -he  is on ACEI/ARB and Statin medications and  is encouraged to continue to follow up with Ophthalmology, Dentist,  Podiatrist at least yearly or according to recommendations, and advised to  stay away from smoking. I have recommended yearly flu vaccine and pneumonia vaccination at least every 5 years; moderate intensity exercise for up to 150 minutes weekly; and  sleep for at least 7 hours a day.  - Time spent with the patient: 1 hour, of which >50% was spent in obtaining information about his symptoms, reviewing his previous labs, evaluations, and treatments, counseling him about his   is uncontrolled and complicated type 2 diabetes, hypothyroidism, hyperlipidemia, hypertension, and developing a plan for long term treatment; his  questions were answered to his satisfaction.  - Patient to bring meter and  blood glucose logs during his next visit.  - I advised patient to maintain close follow up with Timmothy Euler, MD for primary care needs.  Follow up plan: - Return in about 1 week (around 09/18/2017) for follow up with meter and logs- no labs.  Glade Lloyd, MD [facility] [address] [place], [place] [address] Phone: [phone]  Fax: [phone]    09/11/2017, 9:10 AM  This note was partially dictated with voice recognition software. Similar sounding words can be transcribed inadequately or may not  be corrected upon review.

## 2017-09-20 ENCOUNTER — Ambulatory Visit: Payer: Medicare Other | Admitting: "Endocrinology

## 2017-09-20 ENCOUNTER — Encounter: Payer: Self-pay | Admitting: "Endocrinology

## 2017-09-20 VITALS — BP 151/67 | HR 73 | Ht 69.0 in | Wt 246.0 lb

## 2017-09-20 DIAGNOSIS — E039 Hypothyroidism, unspecified: Secondary | ICD-10-CM | POA: Diagnosis not present

## 2017-09-20 DIAGNOSIS — E1159 Type 2 diabetes mellitus with other circulatory complications: Secondary | ICD-10-CM | POA: Diagnosis not present

## 2017-09-20 DIAGNOSIS — I10 Essential (primary) hypertension: Secondary | ICD-10-CM

## 2017-09-20 DIAGNOSIS — Z6837 Body mass index (BMI) 37.0-37.9, adult: Secondary | ICD-10-CM | POA: Diagnosis not present

## 2017-09-20 NOTE — Progress Notes (Signed)
Consult Note       09/20/2017, 8:45 AM   Subjective:    Patient ID: Michael Trevino, male    DOB: [birth year].  he is being seen in consultation for management of currently uncontrolled symptomatic diabetes requested by  Timmothy Euler, MD.   Past Medical History:  Diagnosis Date  . Arthritis   . Diabetes mellitus without complication ([place])   . Hyperlipidemia   . Hypertension   . MI (myocardial infarction) ([place]) 2000  . Renal disorder   . Sepsis ([place])   . Sepsis due to Streptococcus, group B ([place]) 02/24/2013  . Thyroid disease    hypothyroid   History reviewed. No pertinent surgical history. Social History   Socioeconomic History  . Marital status: Married    Spouse name: None  . Number of children: 6  . Years of education: None  . Highest education level: None  Social Needs  . Financial resource strain: None  . Food insecurity - worry: None  . Food insecurity - inability: None  . Transportation needs - medical: None  . Transportation needs - non-medical: None  Occupational History  . None  Tobacco Use  . Smoking status: Former Smoker    Packs/day: 0.50    Years: 30.00    Pack years: 15.00    Types: Cigarettes    Last attempt to quit: 11/05/1984    Years since quitting: 32.8  . Smokeless tobacco: Never Used  Substance and Sexual Activity  . Alcohol use: No  . Drug use: No  . Sexual activity: Yes  Other Topics Concern  . None  Social History Narrative  . None   Outpatient Encounter Medications as of 09/20/2017  Medication Sig  . amLODipine (NORVASC) 10 MG tablet Take 1 tablet (10 mg total) by mouth daily.  Marland Kitchen ascorbic acid (VITAMIN C) 500 MG tablet Take 500 mg by mouth daily.  Marland Kitchen aspirin EC 81 MG tablet Take 81 mg by mouth daily.  Marland Kitchen atorvastatin (LIPITOR) 40 MG tablet TAKE 1 TABLET (40 MG TOTAL) BY MOUTH DAILY.  Marland Kitchen B-D INS SYR ULTRAFINE 1CC/31G 31G X 5/16" 1 ML MISC USE TO INJECT  INSULIN DAILY. DX: 250.02  . B-D UF III MINI PEN NEEDLES 31G X 5 MM MISC USE DAILY  . Calcium Carbonate 500 MG CHEW Chew 1 tablet (500 mg total) by mouth 3 (three) times daily as needed. (Patient taking differently: Chew 500 mg by mouth daily. )  . furosemide (LASIX) 80 MG tablet Take 80 mg daily with breakfast by mouth.  Marland Kitchen glimepiride (AMARYL) 2 MG tablet Take 1 tablet (2 mg total) by mouth daily with breakfast.  . glucose blood test strip Use to check blood glucose once daily.  Dx:  E11.9  . Insulin Detemir (LEVEMIR FLEXPEN) 100 UNIT/ML Pen Inject 30 Units into the skin at bedtime.  . irbesartan (AVAPRO) 300 MG tablet TAKE 1 TABLET (300 MG TOTAL) BY MOUTH DAILY.  Marland Kitchen levothyroxine (SYNTHROID, LEVOTHROID) 125 MCG tablet Take 1 tablet (125 mcg total) by mouth daily before breakfast.  . linagliptin (TRADJENTA) 5 MG TABS tablet Take 1 tablet (5 mg total) by mouth  daily.  . metoprolol tartrate (LOPRESSOR) 100 MG tablet TAKE 1 TABLET (100 MG TOTAL) BY MOUTH 2 (TWO) TIMES DAILY.   No facility-administered encounter medications on file as of 09/20/2017.     ALLERGIES: Allergies  Allergen Reactions  . Zocor [Simvastatin - High Dose] Nausea Only    Unknown    VACCINATION STATUS: Immunization History  Administered Date(s) Administered  . Influenza Whole 08/25/2008  . Influenza, High Dose Seasonal PF 08/30/2016, 08/16/2017  . Influenza,inj,Quad PF,6+ Mos 08/19/2014, 08/25/2015  . Pneumococcal Conjugate-13 04/06/2015  . Pneumococcal Polysaccharide-23 10/06/2005, 12/12/2007    Diabetes  He presents for his follow-up diabetic visit. He has type 2 diabetes mellitus. Onset time: He was diagnosed at approximate age of 17 years. His disease course has been improving. There are no hypoglycemic associated symptoms. Pertinent negatives for hypoglycemia include no confusion, headaches, pallor or seizures. Pertinent negatives for diabetes include no blurred vision, no chest pain, no fatigue, no polydipsia,  no polyphagia, no polyuria and no weakness. There are no hypoglycemic complications. Symptoms are improving. Diabetic complications include heart disease and nephropathy. Risk factors for coronary artery disease include diabetes mellitus, dyslipidemia, family history, hypertension, male sex, obesity, sedentary lifestyle and tobacco exposure. Current diabetic treatment includes insulin injections and oral agent (dual therapy). His weight is decreasing steadily. He is following a generally unhealthy diet. When asked about meal planning, he reported none. He has not had a previous visit with a dietitian. He never participates in exercise. His breakfast blood glucose range is generally 90-110 mg/dl. His lunch blood glucose range is generally 130-140 mg/dl. His dinner blood glucose range is generally 130-140 mg/dl. His bedtime blood glucose range is generally 130-140 mg/dl. His overall blood glucose range is 130-140 mg/dl. An ACE inhibitor/angiotensin II receptor blocker is being taken. He does not see a podiatrist.Eye exam is current.  Hyperlipidemia  This is a chronic problem. The current episode started more than 1 year ago. Exacerbating diseases include diabetes, hypothyroidism and obesity. Pertinent negatives include no chest pain, myalgias or shortness of breath. Current antihyperlipidemic treatment includes statins. Risk factors for coronary artery disease include diabetes mellitus, dyslipidemia, hypertension, male sex, family history, obesity and a sedentary lifestyle.  Hypertension  This is a chronic problem. The current episode started more than 1 year ago. The problem is uncontrolled. Pertinent negatives include no blurred vision, chest pain, headaches, neck pain, palpitations or shortness of breath. Risk factors for coronary artery disease include dyslipidemia, diabetes mellitus, family history, male gender, obesity, sedentary lifestyle and smoking/tobacco exposure. Past treatments include angiotensin  blockers and calcium channel blockers.      Review of Systems  Constitutional: Negative for chills, fatigue, fever and unexpected weight change.  HENT: Negative for dental problem, mouth sores and trouble swallowing.   Eyes: Negative for blurred vision and visual disturbance.  Respiratory: Negative for cough, choking, chest tightness, shortness of breath and wheezing.   Cardiovascular: Negative for chest pain, palpitations and leg swelling.  Gastrointestinal: Negative for abdominal distention, abdominal pain, constipation, diarrhea, nausea and vomiting.  Endocrine: Negative for polydipsia, polyphagia and polyuria.  Genitourinary: Negative for dysuria, flank pain, hematuria and urgency.  Musculoskeletal: Negative for back pain, gait problem, myalgias and neck pain.  Skin: Negative for pallor, rash and wound.  Neurological: Negative for seizures, syncope, weakness, numbness and headaches.  Psychiatric/Behavioral: Negative for confusion and dysphoric mood.    Objective:    BP (!) 151/67   Pulse 73   Ht 5\' 9"  (1.753 m)   Wt  246 lb (111.6 kg)   BMI 36.33 kg/m   Wt Readings from Last 3 Encounters:  09/20/17 246 lb (111.6 kg)  09/11/17 251 lb (113.9 kg)  08/16/17 254 lb 9.6 oz (115.5 kg)     Physical Exam  Constitutional: He is oriented to person, place, and time. He appears well-developed. He is cooperative. No distress.  Patient is unable to read and write  HENT:  Head: Normocephalic and atraumatic.  Eyes: EOM are normal.  Neck: Normal range of motion. Neck supple. No tracheal deviation present. No thyromegaly present.  Cardiovascular: Normal rate, S1 normal, S2 normal and normal heart sounds. Exam reveals no gallop.  No murmur heard. Pulses:      Dorsalis pedis pulses are 1+ on the right side, and 1+ on the left side.       Posterior tibial pulses are 1+ on the right side, and 1+ on the left side.  Pulmonary/Chest: Breath sounds normal. No respiratory distress. He has no  wheezes.  Abdominal: Soft. Bowel sounds are normal. He exhibits no distension. There is no tenderness. There is no guarding and no CVA tenderness.  Musculoskeletal: He exhibits no edema.       Right shoulder: He exhibits no swelling and no deformity.  Neurological: He is alert and oriented to person, place, and time. He has normal strength and normal reflexes. No cranial nerve deficit or sensory deficit. Gait normal.  Skin: Skin is warm and dry. No rash noted. No cyanosis. Nails show no clubbing.  Psychiatric: He has a normal mood and affect. His speech is normal. Cognition and memory are normal.  Patient is illiterate.   CMP     Component Value Date/Time   NA 142 04/16/2017 0840   K 4.3 04/16/2017 0840   CL 106 04/16/2017 0840   CO2 23 04/16/2017 0840   GLUCOSE 151 (H) 04/16/2017 0840   GLUCOSE 161 (H) 02/27/2013 0750   BUN 26 04/16/2017 0840   CREATININE 1.95 (H) 04/16/2017 0840   CALCIUM 8.2 (L) 04/16/2017 0840   PROT 6.6 04/16/2017 0840   ALBUMIN 3.4 (L) 04/16/2017 0840   AST 18 04/16/2017 0840   ALT 15 04/16/2017 0840   ALKPHOS 86 04/16/2017 0840   BILITOT 0.2 04/16/2017 0840   GFRNONAA 32 (L) 04/16/2017 0840   GFRAA 38 (L) 04/16/2017 0840     Diabetic Labs (most recent): Lab Results  Component Value Date   HGBA1C 8.8 08/16/2017   HGBA1C 9.0 01/13/2016   HGBA1C 7.8 07/14/2015     Lipid Panel ( most recent) Lipid Panel     Component Value Date/Time   CHOL 127 04/16/2017 0840   CHOL 108 04/11/2013 1153   TRIG 201 (H) 04/16/2017 0840   TRIG 98 07/17/2013 0940   TRIG 131 04/11/2013 1153   HDL 24 (L) 04/16/2017 0840   HDL 31 (L) 07/17/2013 0940   HDL 33 (L) 04/11/2013 1153   CHOLHDL 5.3 (H) 04/16/2017 0840   LDLCALC 63 04/16/2017 0840   LDLCALC 49 07/17/2013 0940   LDLCALC 49 04/11/2013 1153      Lab Results  Component Value Date   TSH 8.040 (H) 08/16/2017   TSH 7.640 (H) 04/16/2017   TSH 9.510 (H) 10/20/2016   TSH 7.360 (H) 04/11/2016   TSH 8.300 (H)  01/10/2016   TSH 3.080 07/14/2015   TSH 4.550 (H) 04/06/2015   TSH 3.150 02/06/2014   TSH 3.160 10/30/2013   TSH 3.710 07/17/2013  Assessment & Plan:   1. DM type 2 causing vascular disease , Stage 3 renal insufficiency.  - Patient has currently uncontrolled symptomatic type 2 DM since  76 years of age. - Patient came with significant improvement in his lisinopril profile. His recent A1c was 8.8%. -Recent labs reviewed.  -his diabetes is complicated by coronary artery disease, stage 3 renal insufficiency, obesity/sedentary life, illiteracy  and Michael Trevino remains at a high risk for more acute and chronic complications which include CAD, CVA, CKD, retinopathy, and neuropathy. These are all discussed in detail with the patient.  - I have counseled him on diet management and weight loss, by adopting a carbohydrate restricted/protein rich diet.  -  Suggestion is made for him to avoid simple carbohydrates  from his diet including Cakes, Sweet Desserts / Pastries, Ice Cream, Soda (diet and regular), Sweet Tea, Candies, Chips, Cookies, Store Bought Juices, Alcohol in Excess of  1-2 drinks a day, Artificial Sweeteners, and "Sugar-free" Products. This will help patient to have stable blood glucose profile and potentially avoid unintended weight gain. .  - I encouraged him to switch to  unprocessed or minimally processed complex starch and increased protein intake (animal or plant source), fruits, and vegetables.  - he is advised to stick to a routine mealtimes to eat 3 meals  a day and avoid unnecessary snacks ( to snack only to correct hypoglycemia).   - he will be scheduled with Jearld Fenton, RDN, CDE for individualized diabetes education- consult in progress.  - I have approached him with the following individualized plan to manage diabetes and patient agrees:   - he took 39 units of Levemir instead of 30 units prescribed, largely due to his illiteracy taking it difficult   for him to follow recommendations. - His elderly wife , [name],  who can read and write, has offered to help.  - The #1 goal in his diabetes management will still be to avoid hypoglycemia.  - I will attempt to keep treatment simple for them to follow. -I advised for him to lower his basal insulin Levemir to 30 units daily at bedtime, continue strict monitoring of glucose 2 times a day-before breakfast  and at bedtime.  - Patient is warned not to take insulin without proper monitoring per orders.  -Patient is encouraged to call clinic for blood glucose levels less than 70 or above 300 mg /dl. - I will continue Tradjenta 5 mg by mouth every morningrapeutically suitable for patient . -Patient is not a candidate for metformin and insert inhibitors due to CKD. - For lack of better choices, I continued his glimepiride at lower dose of 2 mg daily with breakfast.   - Patient specific target  A1c;  LDL, HDL, Triglycerides, and  Waist Circumference were discussed in detail.  2) BP/HTN: uncontrolled  . Clinic, however he showed me several home blood pressure readings which are marginal including diastolic blood pressure of 50. He has no clinical edema or pulmonary congestion. I advised him to lower his Lasix to only 80 mg by mouth daily from 80 mg by mouth twice a day.  Continue current medications including ACEI/ARB. 3) Lipids/HPL : controlled.  Patient is advised to continue statins. 4)  Weight/Diet: CDE Consult will be initiated , exercise, and detailed carbohydrates information provided.  5) hypothyroidism: Long-standing diagnosis - Patient's recent thyroid function tests are consistent with inadequate replacement. I discussed and increased his levothyroxine to 125 g by mouth every morning.   - We discussed  about correct intake of levothyroxine, at fasting, with water, separated by at least 30 minutes from breakfast, and separated by more than 4 hours from calcium, iron, multivitamins, acid reflux  medications (PPIs). -Patient is made aware of the fact that thyroid hormone replacement is needed for life, dose to be adjusted by periodic monitoring of thyroid function tests.  6) Chronic Care/Health Maintenance:  -he  is on ACEI/ARB and Statin medications and  is encouraged to continue to follow up with Ophthalmology, Dentist,  Podiatrist at least yearly or according to recommendations, and advised to  stay away from smoking. I have recommended yearly flu vaccine and pneumonia vaccination at least every 5 years; moderate intensity exercise for up to 150 minutes weekly; and  sleep for at least 7 hours a day. - I advised patient to maintain close follow up with Timmothy Euler, MD for primary care needs.  Follow up plan: - Return in about 3 months (around 12/21/2017) for meter, and logs, meter, and logs, follow up with pre-visit labs, meter, and logs.  - Time spent with the patient: 25 min, of which >50% was spent in reviewing his sugar logs , discussing his hypo- and hyper-glycemic episodes, reviewing his current and  previous labs and insulin doses and developing a plan to avoid hypo- and hyper-glycemia.    Glade Lloyd, MD [facility] [address] [place], [place] [address] Phone: [phone]  Fax: [phone]    09/20/2017, 8:45 AM  This note was partially dictated with voice recognition software. Similar sounding words can be transcribed inadequately or may not  be corrected upon review.

## 2017-09-20 NOTE — Patient Instructions (Signed)

## 2017-11-23 DIAGNOSIS — I129 Hypertensive chronic kidney disease with stage 1 through stage 4 chronic kidney disease, or unspecified chronic kidney disease: Secondary | ICD-10-CM | POA: Diagnosis not present

## 2017-11-23 DIAGNOSIS — N183 Chronic kidney disease, stage 3 (moderate): Secondary | ICD-10-CM | POA: Diagnosis not present

## 2017-11-23 DIAGNOSIS — D649 Anemia, unspecified: Secondary | ICD-10-CM | POA: Diagnosis not present

## 2017-11-23 DIAGNOSIS — N2581 Secondary hyperparathyroidism of renal origin: Secondary | ICD-10-CM | POA: Diagnosis not present

## 2017-12-11 ENCOUNTER — Other Ambulatory Visit: Payer: Self-pay | Admitting: "Endocrinology

## 2017-12-18 ENCOUNTER — Encounter: Payer: Self-pay | Admitting: Family Medicine

## 2017-12-18 ENCOUNTER — Ambulatory Visit (INDEPENDENT_AMBULATORY_CARE_PROVIDER_SITE_OTHER): Payer: Medicare Other | Admitting: Family Medicine

## 2017-12-18 VITALS — BP 157/57 | HR 66 | Temp 97.5°F | Ht 69.0 in | Wt 247.6 lb

## 2017-12-18 DIAGNOSIS — E1159 Type 2 diabetes mellitus with other circulatory complications: Secondary | ICD-10-CM

## 2017-12-18 DIAGNOSIS — I10 Essential (primary) hypertension: Secondary | ICD-10-CM | POA: Diagnosis not present

## 2017-12-18 DIAGNOSIS — E039 Hypothyroidism, unspecified: Secondary | ICD-10-CM

## 2017-12-18 NOTE — Patient Instructions (Signed)
Great to see you!  Come back in 4 months unless you need us sooner.    

## 2017-12-18 NOTE — Progress Notes (Signed)
   HPI  Patient presents today for chronic medical conditions.  Patient has uncontrolled diabetes and has recently establish care with endocrinology, he feels he is getting very good care. His average fasting blood sugar has been around 90-130. Average bedtime blood sugar appears to be more like the 150s, he has had some 80s and some upper 100s. No hypoglycemia.  Hypertension Blood pressure at home is typically 120s. No headache or chest pain.  Hypothyroidism Previous TSH was 8.0, Synthroid was titrated, he is following up with endocrinology Asymptomatic  PMH: Smoking status noted ROS: Per HPI  Objective: BP (!) 157/57   Pulse 66   Temp (!) 97.5 F (36.4 C) (Oral)   Ht 5\' 9"  (1.753 m)   Wt 247 lb 9.6 oz (112.3 kg)   BMI 36.56 kg/m  Gen: NAD, alert, cooperative with exam HEENT: NCAT CV: RRR, good S1/S2, no murmur Resp: CTABL, no wheezes, non-labored Ext: No edema, warm Neuro: Alert and oriented, No gross deficits  Assessment and plan:  #Type 2 diabetes Patient uncontrolled, has now established with endocrinology, his fasting blood sugars look good on review today Defer management to endocrinology, appreciate their recommendations and management.  #Hypertension Elevated today, however controlled at home No changes Continue Norvasc plus Lasix plus irbesartan plus Lopressor History of CAD  #Hypothyroidism Synthroid titrated last visit, asymptomatic He has TSH pending with endocrinology   Laroy Apple, MD [facility] 12/18/2017, 8:08 AM

## 2017-12-25 ENCOUNTER — Other Ambulatory Visit: Payer: Self-pay | Admitting: Family Medicine

## 2017-12-25 ENCOUNTER — Ambulatory Visit: Payer: Medicare Other | Admitting: "Endocrinology

## 2018-01-07 ENCOUNTER — Other Ambulatory Visit: Payer: Self-pay | Admitting: "Endocrinology

## 2018-01-07 ENCOUNTER — Other Ambulatory Visit: Payer: Medicare Other

## 2018-01-07 DIAGNOSIS — E039 Hypothyroidism, unspecified: Secondary | ICD-10-CM

## 2018-01-07 DIAGNOSIS — E559 Vitamin D deficiency, unspecified: Secondary | ICD-10-CM | POA: Diagnosis not present

## 2018-01-07 DIAGNOSIS — E1165 Type 2 diabetes mellitus with hyperglycemia: Secondary | ICD-10-CM

## 2018-01-08 LAB — TSH: TSH: 8.37 u[IU]/mL — ABNORMAL HIGH (ref 0.450–4.500)

## 2018-01-08 LAB — COMPREHENSIVE METABOLIC PANEL
ALT: 12 IU/L (ref 0–44)
AST: 14 IU/L (ref 0–40)
Albumin/Globulin Ratio: 1.1 — ABNORMAL LOW (ref 1.2–2.2)
Albumin: 3.2 g/dL — ABNORMAL LOW (ref 3.5–4.8)
Alkaline Phosphatase: 84 IU/L (ref 39–117)
BUN/Creatinine Ratio: 10 (ref 10–24)
BUN: 20 mg/dL (ref 8–27)
Bilirubin Total: 0.2 mg/dL (ref 0.0–1.2)
CO2: 22 mmol/L (ref 20–29)
Calcium: 7.7 mg/dL — ABNORMAL LOW (ref 8.6–10.2)
Chloride: 109 mmol/L — ABNORMAL HIGH (ref 96–106)
Creatinine, Ser: 1.98 mg/dL — ABNORMAL HIGH (ref 0.76–1.27)
GFR calc Af Amer: 37 mL/min/{1.73_m2} — ABNORMAL LOW (ref >59)
GFR calc non Af Amer: 32 mL/min/{1.73_m2} — ABNORMAL LOW (ref >59)
Globulin, Total: 3 g/dL (ref 1.5–4.5)
Glucose: 118 mg/dL — ABNORMAL HIGH (ref 65–99)
Potassium: 4.5 mmol/L (ref 3.5–5.2)
Sodium: 145 mmol/L — ABNORMAL HIGH (ref 134–144)
Total Protein: 6.2 g/dL (ref 6.0–8.5)

## 2018-01-08 LAB — MICROALBUMIN / CREATININE URINE RATIO
Creatinine, Urine: 163.8 mg/dL
Microalb/Creat Ratio: 3467 mg/g creat — ABNORMAL HIGH (ref 0.0–30.0)
Microalbumin, Urine: 5679 ug/mL

## 2018-01-08 LAB — VITAMIN D 25 HYDROXY (VIT D DEFICIENCY, FRACTURES): Vit D, 25-Hydroxy: 26.7 ng/mL — ABNORMAL LOW (ref 30.0–100.0)

## 2018-01-08 LAB — T4, FREE: Free T4: 1.22 ng/dL (ref 0.82–1.77)

## 2018-01-08 LAB — HEMOGLOBIN A1C
Est. average glucose Bld gHb Est-mCnc: 186 mg/dL
Hgb A1c MFr Bld: 8.1 % — ABNORMAL HIGH (ref 4.8–5.6)

## 2018-01-09 ENCOUNTER — Encounter: Payer: Self-pay | Admitting: "Endocrinology

## 2018-01-09 ENCOUNTER — Ambulatory Visit: Payer: Medicare Other | Admitting: "Endocrinology

## 2018-01-09 VITALS — BP 156/66 | HR 67 | Ht 69.0 in | Wt 245.0 lb

## 2018-01-09 DIAGNOSIS — E1122 Type 2 diabetes mellitus with diabetic chronic kidney disease: Secondary | ICD-10-CM | POA: Diagnosis not present

## 2018-01-09 DIAGNOSIS — N183 Chronic kidney disease, stage 3 unspecified: Secondary | ICD-10-CM

## 2018-01-09 DIAGNOSIS — E1165 Type 2 diabetes mellitus with hyperglycemia: Secondary | ICD-10-CM | POA: Diagnosis not present

## 2018-01-09 DIAGNOSIS — I10 Essential (primary) hypertension: Secondary | ICD-10-CM

## 2018-01-09 DIAGNOSIS — E1159 Type 2 diabetes mellitus with other circulatory complications: Secondary | ICD-10-CM

## 2018-01-09 DIAGNOSIS — IMO0002 Reserved for concepts with insufficient information to code with codable children: Secondary | ICD-10-CM

## 2018-01-09 DIAGNOSIS — E039 Hypothyroidism, unspecified: Secondary | ICD-10-CM

## 2018-01-09 DIAGNOSIS — N184 Chronic kidney disease, stage 4 (severe): Secondary | ICD-10-CM | POA: Insufficient documentation

## 2018-01-09 MED ORDER — LEVOTHYROXINE SODIUM 137 MCG PO TABS: 137.0000 ug | ORAL_TABLET | Freq: Every day | ORAL | 3 refills | Status: DC

## 2018-01-09 NOTE — Progress Notes (Signed)
Consult Note       01/09/2018, 9:39 AM   Subjective:    Patient ID: Michael Trevino, male    DOB: 1941-02-13.  he is being seen in consultation for management of currently uncontrolled symptomatic diabetes requested by  Timmothy Euler, MD.   Past Medical History:  Diagnosis Date  . Arthritis   . Diabetes mellitus without complication ([place])   . Hyperlipidemia   . Hypertension   . MI (myocardial infarction) ([place]) 2000  . Renal disorder   . Sepsis ([place])   . Sepsis due to Streptococcus, group B ([place]) 02/24/2013  . Thyroid disease    hypothyroid   Past Surgical History:  Procedure Laterality Date  . TEE WITHOUT CARDIOVERSION N/A 02/26/2013   Procedure: TRANSESOPHAGEAL ECHOCARDIOGRAM (TEE);  Surgeon: Thayer Headings, MD;  Location: [facility] ENDOSCOPY;  Service: Cardiovascular;  Laterality: N/A;   Social History   Socioeconomic History  . Marital status: Married    Spouse name: None  . Number of children: 6  . Years of education: None  . Highest education level: None  Social Needs  . Financial resource strain: None  . Food insecurity - worry: None  . Food insecurity - inability: None  . Transportation needs - medical: None  . Transportation needs - non-medical: None  Occupational History  . None  Tobacco Use  . Smoking status: Former Smoker    Packs/day: 0.50    Years: 30.00    Pack years: 15.00    Types: Cigarettes    Last attempt to quit: 11/05/1984    Years since quitting: 33.2  . Smokeless tobacco: Never Used  Substance and Sexual Activity  . Alcohol use: No  . Drug use: No  . Sexual activity: Yes  Other Topics Concern  . None  Social History Narrative  . None   Outpatient Encounter Medications as of 01/09/2018  Medication Sig  . amLODipine (NORVASC) 10 MG tablet Take 1 tablet (10 mg total) by mouth daily.  Marland Kitchen ascorbic acid (VITAMIN C) 500 MG tablet Take 500 mg by mouth daily.  Marland Kitchen  aspirin EC 81 MG tablet Take 81 mg by mouth daily.  Marland Kitchen atorvastatin (LIPITOR) 40 MG tablet TAKE 1 TABLET (40 MG TOTAL) BY MOUTH DAILY.  . Calcium Carbonate 500 MG CHEW Chew 1 tablet (500 mg total) by mouth 3 (three) times daily as needed. (Patient taking differently: Chew 500 mg by mouth daily. )  . furosemide (LASIX) 80 MG tablet Take 80 mg daily with breakfast by mouth.  Marland Kitchen glimepiride (AMARYL) 2 MG tablet TAKE 1 TABLET (2 MG TOTAL) BY MOUTH DAILY WITH BREAKFAST.  Marland Kitchen glucose blood test strip Use to check blood glucose once daily.  Dx:  E11.9  . Insulin Detemir (LEVEMIR FLEXPEN) 100 UNIT/ML Pen Inject 30 Units into the skin at bedtime.  . irbesartan (AVAPRO) 300 MG tablet TAKE 1 TABLET (300 MG TOTAL) BY MOUTH DAILY.  Marland Kitchen levothyroxine (SYNTHROID, LEVOTHROID) 137 MCG tablet Take 1 tablet (137 mcg total) by mouth daily before breakfast.  . linagliptin (TRADJENTA) 5 MG TABS tablet Take 1 tablet (5 mg total) by mouth daily.  Marland Kitchen  metoprolol tartrate (LOPRESSOR) 100 MG tablet TAKE 1 TABLET BY MOUTH TWICE A DAY  . [DISCONTINUED] B-D INS SYR ULTRAFINE 1CC/31G 31G X 5/16" 1 ML MISC USE TO INJECT INSULIN DAILY. DX: 250.02  . [DISCONTINUED] B-D UF III MINI PEN NEEDLES 31G X 5 MM MISC USE DAILY  . [DISCONTINUED] levothyroxine (SYNTHROID, LEVOTHROID) 125 MCG tablet TAKE 1 TABLET (125 MCG TOTAL) BY MOUTH DAILY BEFORE BREAKFAST.   No facility-administered encounter medications on file as of 01/09/2018.     ALLERGIES: Allergies  Allergen Reactions  . Zocor [Simvastatin - High Dose] Nausea Only    Unknown    VACCINATION STATUS: Immunization History  Administered Date(s) Administered  . Influenza Whole 08/25/2008  . Influenza, High Dose Seasonal PF 08/30/2016, 08/16/2017  . Influenza,inj,Quad PF,6+ Mos 08/19/2014, 08/25/2015  . Pneumococcal Conjugate-13 04/06/2015  . Pneumococcal Polysaccharide-23 10/06/2005, 12/12/2007    Diabetes  He presents for his follow-up diabetic visit. He has type 2 diabetes  mellitus. Onset time: He was diagnosed at approximate age of 82 years. His disease course has been improving. There are no hypoglycemic associated symptoms. Pertinent negatives for hypoglycemia include no confusion, headaches, pallor or seizures. Pertinent negatives for diabetes include no blurred vision, no chest pain, no fatigue, no polydipsia, no polyphagia, no polyuria and no weakness. There are no hypoglycemic complications. Symptoms are improving. Diabetic complications include heart disease and nephropathy. Risk factors for coronary artery disease include diabetes mellitus, dyslipidemia, family history, hypertension, male sex, obesity, sedentary lifestyle and tobacco exposure. Current diabetic treatment includes insulin injections and oral agent (dual therapy). His weight is stable. He is following a generally unhealthy diet. When asked about meal planning, he reported none. He has not had a previous visit with a dietitian. He never participates in exercise. His breakfast blood glucose range is generally 130-140 mg/dl. His bedtime blood glucose range is generally 180-200 mg/dl. His overall blood glucose range is 180-200 mg/dl. An ACE inhibitor/angiotensin II receptor blocker is being taken. He does not see a podiatrist.Eye exam is current.  Hyperlipidemia  This is a chronic problem. The current episode started more than 1 year ago. Exacerbating diseases include diabetes, hypothyroidism and obesity. Pertinent negatives include no chest pain, myalgias or shortness of breath. Current antihyperlipidemic treatment includes statins. Risk factors for coronary artery disease include diabetes mellitus, dyslipidemia, hypertension, male sex, family history, obesity and a sedentary lifestyle.  Hypertension  This is a chronic problem. The current episode started more than 1 year ago. The problem is uncontrolled. Pertinent negatives include no blurred vision, chest pain, headaches, neck pain, palpitations or shortness  of breath. Risk factors for coronary artery disease include dyslipidemia, diabetes mellitus, family history, male gender, obesity, sedentary lifestyle and smoking/tobacco exposure. Past treatments include angiotensin blockers and calcium channel blockers. Hypertensive end-organ damage includes kidney disease and CAD/MI.      Review of Systems  Constitutional: Negative for chills, fatigue, fever and unexpected weight change.  HENT: Negative for dental problem, mouth sores and trouble swallowing.   Eyes: Negative for blurred vision and visual disturbance.  Respiratory: Negative for cough, choking, chest tightness, shortness of breath and wheezing.   Cardiovascular: Negative for chest pain, palpitations and leg swelling.  Gastrointestinal: Negative for abdominal distention, abdominal pain, constipation, diarrhea, nausea and vomiting.  Endocrine: Negative for polydipsia, polyphagia and polyuria.  Genitourinary: Negative for dysuria, flank pain, hematuria and urgency.  Musculoskeletal: Negative for back pain, gait problem, myalgias and neck pain.  Skin: Negative for pallor, rash and wound.  Neurological: Negative  for seizures, syncope, weakness, numbness and headaches.  Psychiatric/Behavioral: Negative for confusion and dysphoric mood.    Objective:    BP (!) 156/66   Pulse 67   Ht 5\' 9"  (1.753 m)   Wt 245 lb (111.1 kg)   BMI 36.18 kg/m   Wt Readings from Last 3 Encounters:  01/09/18 245 lb (111.1 kg)  12/18/17 247 lb 9.6 oz (112.3 kg)  09/20/17 246 lb (111.6 kg)     Physical Exam  Constitutional: He is oriented to person, place, and time. He appears well-developed. He is cooperative. No distress.  Patient is unable to read and write  HENT:  Head: Normocephalic and atraumatic.  Eyes: EOM are normal.  Neck: Normal range of motion. Neck supple. No tracheal deviation present. No thyromegaly present.  Cardiovascular: Normal rate, S1 normal, S2 normal and normal heart sounds. Exam  reveals no gallop.  No murmur heard. Pulses:      Dorsalis pedis pulses are 1+ on the right side, and 1+ on the left side.       Posterior tibial pulses are 1+ on the right side, and 1+ on the left side.  Pulmonary/Chest: Breath sounds normal. No respiratory distress. He has no wheezes.  Abdominal: Soft. Bowel sounds are normal. He exhibits no distension. There is no tenderness. There is no guarding and no CVA tenderness.  Musculoskeletal: He exhibits no edema.       Right shoulder: He exhibits no swelling and no deformity.  Neurological: He is alert and oriented to person, place, and time. He has normal strength and normal reflexes. No cranial nerve deficit or sensory deficit. Gait normal.  Skin: Skin is warm and dry. No rash noted. No cyanosis. Nails show no clubbing.  Psychiatric: He has a normal mood and affect. His speech is normal. Cognition and memory are normal.  Patient is illiterate.   CMP     Component Value Date/Time   NA 145 (H) 01/07/2018 0000   K 4.5 01/07/2018 0000   CL 109 (H) 01/07/2018 0000   CO2 22 01/07/2018 0000   GLUCOSE 118 (H) 01/07/2018 0000   GLUCOSE 161 (H) 02/27/2013 0750   BUN 20 01/07/2018 0000   CREATININE 1.98 (H) 01/07/2018 0000   CALCIUM 7.7 (L) 01/07/2018 0000   PROT 6.2 01/07/2018 0000   ALBUMIN 3.2 (L) 01/07/2018 0000   AST 14 01/07/2018 0000   ALT 12 01/07/2018 0000   ALKPHOS 84 01/07/2018 0000   BILITOT 0.2 01/07/2018 0000   GFRNONAA 32 (L) 01/07/2018 0000   GFRAA 37 (L) 01/07/2018 0000     Diabetic Labs (most recent): Lab Results  Component Value Date   HGBA1C 8.1 (H) 01/07/2018   HGBA1C 8.8 08/16/2017   HGBA1C 9.0 01/13/2016     Lipid Panel ( most recent) Lipid Panel     Component Value Date/Time   CHOL 127 04/16/2017 0840   CHOL 108 04/11/2013 1153   TRIG 201 (H) 04/16/2017 0840   TRIG 98 07/17/2013 0940   TRIG 131 04/11/2013 1153   HDL 24 (L) 04/16/2017 0840   HDL 31 (L) 07/17/2013 0940   HDL 33 (L) 04/11/2013 1153    CHOLHDL 5.3 (H) 04/16/2017 0840   LDLCALC 63 04/16/2017 0840   LDLCALC 49 07/17/2013 0940   LDLCALC 49 04/11/2013 1153      Lab Results  Component Value Date   TSH 8.370 (H) 01/07/2018   TSH 8.040 (H) 08/16/2017   TSH 7.640 (H) 04/16/2017   TSH 9.510 (  H) 10/20/2016   TSH 7.360 (H) 04/11/2016   TSH 8.300 (H) 01/10/2016   TSH 3.080 07/14/2015   TSH 4.550 (H) 04/06/2015   TSH 3.150 02/06/2014   TSH 3.160 10/30/2013   FREET4 1.22 01/07/2018           Assessment & Plan:   1. DM type 2 causing vascular disease , Stage 3 renal insufficiency.  - Patient has currently uncontrolled symptomatic type 2 DM since  77 years of age. - Patient came with significant improvement in his lisinopril profile. -His previsit labs show A1c of 8.1%, improving from 8.8%.    -Recent labs reviewed.  -his diabetes is complicated by coronary artery disease, stage 3 renal insufficiency, obesity/sedentary life, illiteracy  and Zuhair Lariccia remains at a high risk for more acute and chronic complications which include CAD, CVA, CKD, retinopathy, and neuropathy. These are all discussed in detail with the patient.  - I have counseled him on diet management and weight loss, by adopting a carbohydrate restricted/protein rich diet.  -  Suggestion is made for him to avoid simple carbohydrates  from his diet including Cakes, Sweet Desserts / Pastries, Ice Cream, Soda (diet and regular), Sweet Tea, Candies, Chips, Cookies, Store Bought Juices, Alcohol in Excess of  1-2 drinks a day, Artificial Sweeteners, and "Sugar-free" Products. This will help patient to have stable blood glucose profile and potentially avoid unintended weight gain.  - I encouraged him to switch to  unprocessed or minimally processed complex starch and increased protein intake (animal or plant source), fruits, and vegetables.  - he is advised to stick to a routine mealtimes to eat 3 meals  a day and avoid unnecessary snacks ( to snack only to  correct hypoglycemia).    - I have approached him with the following individualized plan to manage diabetes and patient agrees:    -Patient is illiterate, being helped by his elderly wife who can read and write.   -The #1 goal in his diabetes management will still be to avoid hypoglycemia.  - I will attempt to keep treatment simple for them to follow. -I advised for him to continue Levemir 30 units daily at bedtime, continue strict monitoring of glucose 2 times a day-before breakfast  and at bedtime.  - Patient is warned not to take insulin without proper monitoring per orders.  -Patient is encouraged to call clinic for blood glucose levels less than 70 or above 300 mg /dl. - I will continue Tradjenta 5 mg by mouth every morningrapeutically suitable for patient . -Patient is not a candidate for metformin and SGLT2 inhibitors due to CKD. - For lack of better choices, I continued his glimepiride at lower dose of 2 mg daily with breakfast.   - Patient specific target  A1c;  LDL, HDL, Triglycerides, and  Waist Circumference were discussed in detail.  2) BP/HTN: His blood pressure is not controlled to target.  I advised him to lower his Lasix to only 80 mg by mouth daily from 80 mg by mouth twice a day.  He is on beta-blockers and are up, advised to continue on same.    3) Lipids/HPL : controlled.  Patient is advised to continue statins. 4)  Weight/Diet: CDE Consult will be initiated , exercise, and detailed carbohydrates information provided.  5) hypothyroidism: Long-standing diagnosis -His thyroid function tests are consistent with inadequate replacement.  I discussed and increase his levothyroxine to 137 mcg p.o. every morning.     - We discussed about correct  intake of levothyroxine, at fasting, with water, separated by at least 30 minutes from breakfast, and separated by more than 4 hours from calcium, iron, multivitamins, acid reflux medications (PPIs). -Patient is made aware of the  fact that thyroid hormone replacement is needed for life, dose to be adjusted by periodic monitoring of thyroid function tests.    6) Chronic Care/Health Maintenance:  -he  is on ACEI/ARB and Statin medications and  is encouraged to continue to follow up with Ophthalmology, Dentist,  Podiatrist at least yearly or according to recommendations, and advised to  stay away from smoking. I have recommended yearly flu vaccine and pneumonia vaccination at least every 5 years; and  sleep for at least 7 hours a day. - I advised patient to maintain close follow up with Timmothy Euler, MD for primary care needs.  Follow up plan: - Return in about 3 months (around 04/08/2018) for follow up with pre-visit labs, meter, and logs.  - Time spent with the patient: 25 min, of which >50% was spent in reviewing his blood glucose logs , discussing his hypo- and hyper-glycemic episodes, reviewing his current and  previous labs and insulin doses and developing a plan to avoid hypo- and hyper-glycemia. Please refer to Patient Instructions for Blood Glucose Monitoring and Insulin/Medications Dosing Guide"  in media tab for additional information.     Michael Lloyd, MD [facility] [address] [place], [place] [address] Phone: [phone]  Fax: [phone]    01/09/2018, 9:39 AM  This note was partially dictated with voice recognition software. Similar sounding words can be transcribed inadequately or may not  be corrected upon review.

## 2018-01-09 NOTE — Patient Instructions (Signed)

## 2018-01-14 ENCOUNTER — Other Ambulatory Visit: Payer: Self-pay | Admitting: Family Medicine

## 2018-02-22 ENCOUNTER — Telehealth: Payer: Self-pay | Admitting: Cardiology

## 2018-02-22 NOTE — Telephone Encounter (Signed)
NEW MESSAGE  Belenda Cruise at [facility] ext [phone] Fax [phone]   1) Are you calling to confirm a diagnosis or obtain personal health information (Y/N)? yes  2) If so, what information is requested? Vitals with the date, EF%, date of echo  Please route to Medical Records or your medical records site representative

## 2018-03-15 NOTE — Telephone Encounter (Signed)
Follow Up:   Michael Trevino is following up on her request.Please fax this [phone] [id number].

## 2018-03-21 NOTE — Telephone Encounter (Signed)
Left message for Michael Trevino with answers to her questions. She is to call back if more information is needed.

## 2018-03-28 ENCOUNTER — Other Ambulatory Visit: Payer: Self-pay | Admitting: Family Medicine

## 2018-04-10 ENCOUNTER — Other Ambulatory Visit: Payer: Self-pay | Admitting: "Endocrinology

## 2018-04-10 ENCOUNTER — Ambulatory Visit: Payer: Medicare Other | Admitting: "Endocrinology

## 2018-04-10 ENCOUNTER — Other Ambulatory Visit: Payer: Medicare Other

## 2018-04-10 DIAGNOSIS — E1165 Type 2 diabetes mellitus with hyperglycemia: Secondary | ICD-10-CM

## 2018-04-10 DIAGNOSIS — E039 Hypothyroidism, unspecified: Secondary | ICD-10-CM

## 2018-04-11 ENCOUNTER — Encounter: Payer: Self-pay | Admitting: "Endocrinology

## 2018-04-11 ENCOUNTER — Ambulatory Visit (INDEPENDENT_AMBULATORY_CARE_PROVIDER_SITE_OTHER): Payer: Medicare Other | Admitting: "Endocrinology

## 2018-04-11 VITALS — BP 161/66 | HR 82 | Ht 69.0 in | Wt 248.0 lb

## 2018-04-11 DIAGNOSIS — I10 Essential (primary) hypertension: Secondary | ICD-10-CM

## 2018-04-11 DIAGNOSIS — E1159 Type 2 diabetes mellitus with other circulatory complications: Secondary | ICD-10-CM | POA: Diagnosis not present

## 2018-04-11 DIAGNOSIS — Z794 Long term (current) use of insulin: Secondary | ICD-10-CM

## 2018-04-11 DIAGNOSIS — E1165 Type 2 diabetes mellitus with hyperglycemia: Secondary | ICD-10-CM

## 2018-04-11 DIAGNOSIS — N183 Chronic kidney disease, stage 3 unspecified: Secondary | ICD-10-CM

## 2018-04-11 DIAGNOSIS — IMO0002 Reserved for concepts with insufficient information to code with codable children: Secondary | ICD-10-CM

## 2018-04-11 DIAGNOSIS — E1121 Type 2 diabetes mellitus with diabetic nephropathy: Secondary | ICD-10-CM | POA: Diagnosis not present

## 2018-04-11 DIAGNOSIS — E039 Hypothyroidism, unspecified: Secondary | ICD-10-CM | POA: Diagnosis not present

## 2018-04-11 DIAGNOSIS — E1122 Type 2 diabetes mellitus with diabetic chronic kidney disease: Secondary | ICD-10-CM

## 2018-04-11 LAB — COMPREHENSIVE METABOLIC PANEL
ALT: 10 IU/L (ref 0–44)
AST: 13 IU/L (ref 0–40)
Albumin/Globulin Ratio: 1.1 — ABNORMAL LOW (ref 1.2–2.2)
Albumin: 3.1 g/dL — ABNORMAL LOW (ref 3.5–4.8)
Alkaline Phosphatase: 89 IU/L (ref 39–117)
BUN/Creatinine Ratio: 9 — ABNORMAL LOW (ref 10–24)
BUN: 19 mg/dL (ref 8–27)
Bilirubin Total: 0.2 mg/dL (ref 0.0–1.2)
CO2: 23 mmol/L (ref 20–29)
Calcium: 7.8 mg/dL — ABNORMAL LOW (ref 8.6–10.2)
Chloride: 105 mmol/L (ref 96–106)
Creatinine, Ser: 2.05 mg/dL — ABNORMAL HIGH (ref 0.76–1.27)
GFR calc Af Amer: 35 mL/min/{1.73_m2} — ABNORMAL LOW (ref >59)
GFR calc non Af Amer: 30 mL/min/{1.73_m2} — ABNORMAL LOW (ref >59)
Globulin, Total: 2.8 g/dL (ref 1.5–4.5)
Glucose: 140 mg/dL — ABNORMAL HIGH (ref 65–99)
Potassium: 4.2 mmol/L (ref 3.5–5.2)
Sodium: 141 mmol/L (ref 134–144)
Total Protein: 5.9 g/dL — ABNORMAL LOW (ref 6.0–8.5)

## 2018-04-11 LAB — TSH: TSH: 6.39 u[IU]/mL — ABNORMAL HIGH (ref 0.450–4.500)

## 2018-04-11 LAB — HEMOGLOBIN A1C
Est. average glucose Bld gHb Est-mCnc: 192 mg/dL
Hgb A1c MFr Bld: 8.3 % — ABNORMAL HIGH (ref 4.8–5.6)

## 2018-04-11 LAB — T4, FREE: Free T4: 1.42 ng/dL (ref 0.82–1.77)

## 2018-04-11 MED ORDER — LEVOTHYROXINE SODIUM 150 MCG PO TABS: 150.0000 ug | ORAL_TABLET | Freq: Every day | ORAL | 6 refills | Status: DC

## 2018-04-11 MED ORDER — INSULIN DETEMIR 100 UNIT/ML FLEXPEN: 30.0000 [IU] | PEN_INJECTOR | Freq: Every day | SUBCUTANEOUS | 2 refills | Status: DC

## 2018-04-11 NOTE — Progress Notes (Signed)
Endocrinology follow-up note       04/11/2018, 1:16 PM   Subjective:    Patient ID: Michael Trevino, male    DOB: [birth year].  he is being seen in follow-up for management of currently uncontrolled symptomatic diabetes requested by  Timmothy Euler, MD.   Past Medical History:  Diagnosis Date  . Arthritis   . Diabetes mellitus without complication ([place])   . Hyperlipidemia   . Hypertension   . MI (myocardial infarction) ([place]) 2000  . Renal disorder   . Sepsis ([place])   . Sepsis due to Streptococcus, group B ([place]) 02/24/2013  . Thyroid disease    hypothyroid   Past Surgical History:  Procedure Laterality Date  . TEE WITHOUT CARDIOVERSION N/A 02/26/2013   Procedure: TRANSESOPHAGEAL ECHOCARDIOGRAM (TEE);  Surgeon: Thayer Headings, MD;  Location: [facility] ENDOSCOPY;  Service: Cardiovascular;  Laterality: N/A;   Social History   Socioeconomic History  . Marital status: Married    Spouse name: Not on file  . Number of children: 6  . Years of education: Not on file  . Highest education level: Not on file  Occupational History  . Not on file  Social Needs  . Financial resource strain: Not on file  . Food insecurity:    Worry: Not on file    Inability: Not on file  . Transportation needs:    Medical: Not on file    Non-medical: Not on file  Tobacco Use  . Smoking status: Former Smoker    Packs/day: 0.50    Years: 30.00    Pack years: 15.00    Types: Cigarettes    Last attempt to quit: 11/05/1984    Years since quitting: 33.4  . Smokeless tobacco: Never Used  Substance and Sexual Activity  . Alcohol use: No  . Drug use: No  . Sexual activity: Yes  Lifestyle  . Physical activity:    Days per week: Not on file    Minutes per session: Not on file  . Stress: Not on file  Relationships  . Social connections:    Talks on phone: Not on file    Gets together: Not on file    Attends religious  service: Not on file    Active member of club or organization: Not on file    Attends meetings of clubs or organizations: Not on file    Relationship status: Not on file  Other Topics Concern  . Not on file  Social History Narrative  . Not on file   Outpatient Encounter Medications as of 04/11/2018  Medication Sig  . amLODipine (NORVASC) 10 MG tablet Take 1 tablet (10 mg total) by mouth daily.  Marland Kitchen ascorbic acid (VITAMIN C) 500 MG tablet Take 500 mg by mouth daily.  Marland Kitchen aspirin EC 81 MG tablet Take 81 mg by mouth daily.  Marland Kitchen atorvastatin (LIPITOR) 40 MG tablet TAKE 1 TABLET (40 MG TOTAL) BY MOUTH DAILY.  . Calcium Carbonate 500 MG CHEW Chew 1 tablet (500 mg total) by mouth 3 (three) times daily as needed. (Patient taking differently: Chew 500 mg by mouth daily. )  . furosemide (LASIX)  80 MG tablet Take 80 mg daily with breakfast by mouth.  Marland Kitchen glimepiride (AMARYL) 2 MG tablet TAKE 1 TABLET (2 MG TOTAL) BY MOUTH DAILY WITH BREAKFAST.  Marland Kitchen glucose blood test strip Use to check blood glucose once daily.  Dx:  E11.9  . Insulin Detemir (LEVEMIR FLEXPEN) 100 UNIT/ML Pen Inject 30 Units into the skin at bedtime.  . irbesartan (AVAPRO) 300 MG tablet TAKE 1 TABLET BY MOUTH EVERY DAY  . levothyroxine (SYNTHROID, LEVOTHROID) 150 MCG tablet Take 1 tablet (150 mcg total) by mouth daily before breakfast.  . linagliptin (TRADJENTA) 5 MG TABS tablet Take 1 tablet (5 mg total) by mouth daily.  . metoprolol tartrate (LOPRESSOR) 100 MG tablet TAKE 1 TABLET BY MOUTH TWICE A DAY  . [DISCONTINUED] Insulin Detemir (LEVEMIR FLEXPEN) 100 UNIT/ML Pen Inject 30 Units into the skin at bedtime.  . [DISCONTINUED] levothyroxine (SYNTHROID, LEVOTHROID) 137 MCG tablet Take 1 tablet (137 mcg total) by mouth daily before breakfast.   No facility-administered encounter medications on file as of 04/11/2018.     ALLERGIES: Allergies  Allergen Reactions  . Zocor [Simvastatin - High Dose] Nausea Only    Unknown    VACCINATION  STATUS: Immunization History  Administered Date(s) Administered  . Influenza Whole 08/25/2008  . Influenza, High Dose Seasonal PF 08/30/2016, 08/16/2017  . Influenza,inj,Quad PF,6+ Mos 08/19/2014, 08/25/2015  . Pneumococcal Conjugate-13 04/06/2015  . Pneumococcal Polysaccharide-23 10/06/2005, 12/12/2007    Diabetes  He presents for his follow-up diabetic visit. He has type 2 diabetes mellitus. Onset time: He was diagnosed at approximate age of 43 years. His disease course has been improving. There are no hypoglycemic associated symptoms. Pertinent negatives for hypoglycemia include no confusion, headaches, pallor or seizures. Pertinent negatives for diabetes include no blurred vision, no chest pain, no fatigue, no polydipsia, no polyphagia, no polyuria and no weakness. There are no hypoglycemic complications. Symptoms are improving. Diabetic complications include heart disease and nephropathy. Risk factors for coronary artery disease include diabetes mellitus, dyslipidemia, family history, hypertension, male sex, obesity, sedentary lifestyle and tobacco exposure. Current diabetic treatment includes insulin injections and oral agent (dual therapy). His weight is stable. He is following a generally unhealthy diet. When asked about meal planning, he reported none. He has not had a previous visit with a dietitian. He never participates in exercise. His breakfast blood glucose range is generally 130-140 mg/dl. His bedtime blood glucose range is generally 180-200 mg/dl. His overall blood glucose range is 180-200 mg/dl. An ACE inhibitor/angiotensin II receptor blocker is being taken. He does not see a podiatrist.Eye exam is current.  Hyperlipidemia  This is a chronic problem. The current episode started more than 1 year ago. Exacerbating diseases include diabetes, hypothyroidism and obesity. Pertinent negatives include no chest pain, myalgias or shortness of breath. Current antihyperlipidemic treatment  includes statins. Risk factors for coronary artery disease include diabetes mellitus, dyslipidemia, hypertension, male sex, family history, obesity and a sedentary lifestyle.  Hypertension  This is a chronic problem. The current episode started more than 1 year ago. The problem is uncontrolled. Pertinent negatives include no blurred vision, chest pain, headaches, neck pain, palpitations or shortness of breath. Risk factors for coronary artery disease include dyslipidemia, diabetes mellitus, family history, male gender, obesity, sedentary lifestyle and smoking/tobacco exposure. Past treatments include angiotensin blockers and calcium channel blockers. Hypertensive end-organ damage includes kidney disease and CAD/MI.      Review of Systems  Constitutional: Negative for chills, fatigue, fever and unexpected weight change.  HENT: Negative  for dental problem, mouth sores and trouble swallowing.   Eyes: Negative for blurred vision and visual disturbance.  Respiratory: Negative for cough, choking, chest tightness, shortness of breath and wheezing.   Cardiovascular: Negative for chest pain, palpitations and leg swelling.  Gastrointestinal: Negative for abdominal distention, abdominal pain, constipation, diarrhea, nausea and vomiting.  Endocrine: Negative for polydipsia, polyphagia and polyuria.  Genitourinary: Negative for dysuria, flank pain, hematuria and urgency.  Musculoskeletal: Negative for back pain, gait problem, myalgias and neck pain.  Skin: Negative for pallor, rash and wound.  Neurological: Negative for seizures, syncope, weakness, numbness and headaches.  Psychiatric/Behavioral: Negative for confusion and dysphoric mood.    Objective:    BP (!) 161/66   Pulse 82   Ht 5\' 9"  (1.753 m)   Wt 248 lb (112.5 kg)   BMI 36.62 kg/m   Wt Readings from Last 3 Encounters:  04/11/18 248 lb (112.5 kg)  01/09/18 245 lb (111.1 kg)  12/18/17 247 lb 9.6 oz (112.3 kg)     Physical Exam   Constitutional: He is oriented to person, place, and time. He appears well-developed. He is cooperative. No distress.  Patient is unable to read and write  HENT:  Head: Normocephalic and atraumatic.  Eyes: EOM are normal.  Neck: Normal range of motion. Neck supple. No tracheal deviation present. No thyromegaly present.  Cardiovascular: Normal rate, S1 normal, S2 normal and normal heart sounds. Exam reveals no gallop.  No murmur heard. Pulses:      Dorsalis pedis pulses are 1+ on the right side, and 1+ on the left side.       Posterior tibial pulses are 1+ on the right side, and 1+ on the left side.  Pulmonary/Chest: Breath sounds normal. No respiratory distress. He has no wheezes.  Abdominal: Soft. Bowel sounds are normal. He exhibits no distension. There is no tenderness. There is no guarding and no CVA tenderness.  Musculoskeletal: He exhibits no edema.       Right shoulder: He exhibits no swelling and no deformity.  Neurological: He is alert and oriented to person, place, and time. He has normal strength and normal reflexes. No cranial nerve deficit or sensory deficit. Gait normal.  Skin: Skin is warm and dry. No rash noted. No cyanosis. Nails show no clubbing.  Psychiatric: He has a normal mood and affect. His speech is normal. Cognition and memory are normal.  Patient is illiterate.   CMP     Component Value Date/Time   NA 141 04/10/2018 1238   K 4.2 04/10/2018 1238   CL 105 04/10/2018 1238   CO2 23 04/10/2018 1238   GLUCOSE 140 (H) 04/10/2018 1238   GLUCOSE 161 (H) 02/27/2013 0750   BUN 19 04/10/2018 1238   CREATININE 2.05 (H) 04/10/2018 1238   CALCIUM 7.8 (L) 04/10/2018 1238   PROT 5.9 (L) 04/10/2018 1238   ALBUMIN 3.1 (L) 04/10/2018 1238   AST 13 04/10/2018 1238   ALT 10 04/10/2018 1238   ALKPHOS 89 04/10/2018 1238   BILITOT 0.2 04/10/2018 1238   GFRNONAA 30 (L) 04/10/2018 1238   GFRAA 35 (L) 04/10/2018 1238     Diabetic Labs (most recent): Lab Results   Component Value Date   HGBA1C 8.3 (H) 04/10/2018   HGBA1C 8.1 (H) 01/07/2018   HGBA1C 8.8 08/16/2017     Lipid Panel ( most recent) Lipid Panel     Component Value Date/Time   CHOL 127 04/16/2017 0840   CHOL 108 04/11/2013 1153  TRIG 201 (H) 04/16/2017 0840   TRIG 98 07/17/2013 0940   TRIG 131 04/11/2013 1153   HDL 24 (L) 04/16/2017 0840   HDL 31 (L) 07/17/2013 0940   HDL 33 (L) 04/11/2013 1153   CHOLHDL 5.3 (H) 04/16/2017 0840   LDLCALC 63 04/16/2017 0840   LDLCALC 49 07/17/2013 0940   LDLCALC 49 04/11/2013 1153      Lab Results  Component Value Date   TSH 6.390 (H) 04/10/2018   TSH 8.370 (H) 01/07/2018   TSH 8.040 (H) 08/16/2017   TSH 7.640 (H) 04/16/2017   TSH 9.510 (H) 10/20/2016   TSH 7.360 (H) 04/11/2016   TSH 8.300 (H) 01/10/2016   TSH 3.080 07/14/2015   TSH 4.550 (H) 04/06/2015   TSH 3.150 02/06/2014   FREET4 1.42 04/10/2018   FREET4 1.22 01/07/2018           Assessment & Plan:   1. DM type 2 causing vascular disease , Stage 3 renal insufficiency.  - Patient has currently uncontrolled symptomatic type 2 DM since  77 years of age. - Patient came with continued engagement and near target blood glucose profile.  His A1c is 8.3%, generally improving from 8.8%.    -Recent labs reviewed.  -his diabetes is complicated by coronary artery disease, stage 3 renal insufficiency, obesity/sedentary life, illiteracy  and Elwin Tsou remains at a high risk for more acute and chronic complications which include CAD, CVA, CKD, retinopathy, and neuropathy. These are all discussed in detail with the patient.  - I have counseled him on diet management and weight loss, by adopting a carbohydrate restricted/protein rich diet.  -  Suggestion is made for him to avoid simple carbohydrates  from his diet including Cakes, Sweet Desserts / Pastries, Ice Cream, Soda (diet and regular), Sweet Tea, Candies, Chips, Cookies, Store Bought Juices, Alcohol in Excess of  1-2 drinks a  day, Artificial Sweeteners, and "Sugar-free" Products. This will help patient to have stable blood glucose profile and potentially avoid unintended weight gain.   - I encouraged him to switch to  unprocessed or minimally processed complex starch and increased protein intake (animal or plant source), fruits, and vegetables.  - he is advised to stick to a routine mealtimes to eat 3 meals  a day and avoid unnecessary snacks ( to snack only to correct hypoglycemia).    - I have approached him with the following individualized plan to manage diabetes and patient agrees:    -Patient is illiterate, being helped by his elderly wife who can read and write.   -The #1 goal in his diabetes management will still be to avoid hypoglycemia.  -His current A1c of 8.3% is acceptable target for him. - I will attempt to keep treatment simple for them to follow. -I advised him to continue Levemir 30 units at bedtime daily, continue strict monitoring of blood glucose 2 times daily- before breakfast and at bedtime, and as needed anytime.    - Patient is warned not to take insulin without proper monitoring per orders.  -Patient is encouraged to call clinic for blood glucose levels less than 70 or above 300 mg /dl. - I will continue Tradjenta 5 mg by mouth every morning therapeutically suitable for patient . -Patient is not a candidate for metformin and SGLT2 inhibitors due to CKD. - For lack of better choices, I continued his glimepiride at lower dose of 2 mg daily with breakfast.   - Patient specific target  A1c;  LDL, HDL, Triglycerides,  and  Waist Circumference were discussed in detail.  2) BP/HTN: His blood pressure is not controlled to target.  He admits to have missed his morning blood pressure medications today.   I advised him to lower his Lasix  80 mg by mouth daily.  He is on beta-blockers , advised to continue on same.    3) Lipids/HPL : His most recent lipid panel showed controlled LDL at 63.   He is  advised to continue atorvastatin 40 mg p.o. nightly. 4)  Weight/Diet: CDE Consult will be initiated , exercise, and detailed carbohydrates information provided.  5) hypothyroidism: Long-standing diagnosis -His thyroid function tests are consistent with inadequate replacement.  I discussed and increased his levothyroxine to 150 mcg p.o. every morning.     - We discussed about correct intake of levothyroxine, at fasting, with water, separated by at least 30 minutes from breakfast, and separated by more than 4 hours from calcium, iron, multivitamins, acid reflux medications (PPIs). -Patient is made aware of the fact that thyroid hormone replacement is needed for life, dose to be adjusted by periodic monitoring of thyroid function tests.  6) Chronic Care/Health Maintenance:  -he  is on ACEI/ARB and Statin medications and  is encouraged to continue to follow up with Ophthalmology, Dentist,  Podiatrist at least yearly or according to recommendations, and advised to  stay away from smoking. I have recommended yearly flu vaccine and pneumonia vaccination at least every 5 years; and  sleep for at least 7 hours a day. - I advised patient to maintain close follow up with Timmothy Euler, MD for primary care needs.  - Time spent with the patient: 25 min, of which >50% was spent in reviewing his blood glucose logs , discussing his hypo- and hyper-glycemic episodes, reviewing his current and  previous labs and insulin doses and developing a plan to avoid hypo- and hyper-glycemia. Please refer to Patient Instructions for Blood Glucose Monitoring and Insulin/Medications Dosing Guide"  in media tab for additional information. [name] participated in the discussions, expressed understanding, and voiced agreement with the above plans.  All questions were answered to his satisfaction. he is encouraged to contact clinic should he have any questions or concerns prior to his return visit.  Follow up plan: -  Return in about 3 months (around 07/12/2018) for follow up with pre-visit labs, meter, and logs.   Glade Lloyd, MD [facility] [address] [place], [place] [address] Phone: [phone]  Fax: [phone]    04/11/2018, 1:16 PM  This note was partially dictated with voice recognition software. Similar sounding words can be transcribed inadequately or may not  be corrected upon review.

## 2018-04-11 NOTE — Patient Instructions (Signed)

## 2018-04-18 ENCOUNTER — Encounter: Payer: Self-pay | Admitting: Family Medicine

## 2018-04-18 ENCOUNTER — Ambulatory Visit (INDEPENDENT_AMBULATORY_CARE_PROVIDER_SITE_OTHER): Payer: Medicare Other | Admitting: Family Medicine

## 2018-04-18 VITALS — BP 157/55 | HR 69 | Temp 97.4°F | Ht 69.0 in | Wt 244.4 lb

## 2018-04-18 DIAGNOSIS — I10 Essential (primary) hypertension: Secondary | ICD-10-CM | POA: Diagnosis not present

## 2018-04-18 NOTE — Progress Notes (Signed)
   HPI  Patient presents today for follow-up chronic medical conditions.  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  Blood pressure at home ranges 120s and 30s over 60s No headache or chest pain. Mild leg swelling bilaterally. Good medication compliance and tolerance.    PMH: Smoking status noted ROS: Per HPI  Objective: BP (!) 157/55   Pulse 69   Temp (!) 97.4 F (36.3 C) (Oral)   Ht 5\' 9"  (1.753 m)   Wt 244 lb 6.4 oz (110.9 kg)   BMI 36.09 kg/m  Gen: NAD, alert, cooperative with exam HEENT: NCAT CV: RRR, good S1/S2 Resp: CTABL, no wheezes, non-labored Ext: No edema, warm Neuro: Alert and oriented, No gross deficits  Assessment and plan:  #Hypertension Elevated here, I believe this is whitecoat hypertension, his blood pressure log at home looks good. He states that his blood pressure cuff was checked by nephrology Continue ARB, beta-blocker, amlodipine Follow-up 4 months   Needs lipids with next lab draw   Laroy Apple, MD [facility] 04/18/2018, 7:59 AM

## 2018-04-18 NOTE — Patient Instructions (Signed)
Great to see you!  Come back in 4 months to see Dr. Warrick Parisian

## 2018-05-05 ENCOUNTER — Other Ambulatory Visit: Payer: Self-pay | Admitting: "Endocrinology

## 2018-05-06 ENCOUNTER — Other Ambulatory Visit: Payer: Self-pay | Admitting: Family Medicine

## 2018-05-06 MED ORDER — LEVOTHYROXINE SODIUM 150 MCG PO TABS: 150.0000 ug | ORAL_TABLET | Freq: Every day | ORAL | 6 refills | Status: DC

## 2018-05-06 NOTE — Telephone Encounter (Signed)
Last lipid 04/27/16

## 2018-05-31 DIAGNOSIS — D649 Anemia, unspecified: Secondary | ICD-10-CM | POA: Diagnosis not present

## 2018-05-31 DIAGNOSIS — N2581 Secondary hyperparathyroidism of renal origin: Secondary | ICD-10-CM | POA: Diagnosis not present

## 2018-05-31 DIAGNOSIS — E1129 Type 2 diabetes mellitus with other diabetic kidney complication: Secondary | ICD-10-CM | POA: Diagnosis not present

## 2018-05-31 DIAGNOSIS — I129 Hypertensive chronic kidney disease with stage 1 through stage 4 chronic kidney disease, or unspecified chronic kidney disease: Secondary | ICD-10-CM | POA: Diagnosis not present

## 2018-05-31 DIAGNOSIS — N183 Chronic kidney disease, stage 3 (moderate): Secondary | ICD-10-CM | POA: Diagnosis not present

## 2018-05-31 DIAGNOSIS — E1122 Type 2 diabetes mellitus with diabetic chronic kidney disease: Secondary | ICD-10-CM | POA: Diagnosis not present

## 2018-06-13 ENCOUNTER — Ambulatory Visit (INDEPENDENT_AMBULATORY_CARE_PROVIDER_SITE_OTHER): Payer: Medicare Other | Admitting: *Deleted

## 2018-06-13 ENCOUNTER — Encounter: Payer: Self-pay | Admitting: *Deleted

## 2018-06-13 VITALS — BP 145/68 | HR 75 | Ht 68.0 in | Wt 249.0 lb

## 2018-06-13 DIAGNOSIS — Z0000 Encounter for general adult medical examination without abnormal findings: Secondary | ICD-10-CM | POA: Diagnosis not present

## 2018-06-13 NOTE — Progress Notes (Signed)
Subjective:   Michael Trevino is a 77 y.o. male who presents for a Medicare Annual Wellness Visit. Michael Trevino lives at home with his wife of 43 years. They have 6 adult children and 15 grandchildren. They all live locally and most come over on Sundays to visit. 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 He doesn't do anything very strenuous.   Review of Systems    Patient reports that his overall health is unchanged compared to last year.  Cardiac Risk Factors include: advanced age (>72men, >11 women);diabetes mellitus;dyslipidemia;hypertension;male gender;obesity (BMI >30kg/m2);smoking/ tobacco exposure  Endocrinology: diabetes managed by Dr Michael Trevino in [employer]. Tries to follow a healthy diet and watch sugar and carb intake.   All other systems negative       Current Medications (verified) Outpatient Encounter Medications as of 06/13/2018  Medication Sig  . amLODipine (NORVASC) 10 MG tablet Take 1 tablet (10 mg total) by mouth daily.  Marland Kitchen ascorbic acid (VITAMIN C) 500 MG tablet Take 500 mg by mouth daily.  Marland Kitchen aspirin EC 81 MG tablet Take 81 mg by mouth daily.  Marland Kitchen atorvastatin (LIPITOR) 40 MG tablet TAKE 1 TABLET BY MOUTH EVERY DAY  . Calcium Carbonate 500 MG CHEW Chew 1 tablet (500 mg total) by mouth 3 (three) times daily as needed. (Patient taking differently: Chew 500 mg by mouth daily. )  . furosemide (LASIX) 80 MG tablet Take 80 mg by mouth 2 (two) times daily.   Marland Kitchen glimepiride (AMARYL) 2 MG tablet TAKE 1 TABLET (2 MG TOTAL) BY MOUTH DAILY WITH BREAKFAST.  Marland Kitchen glucose blood test strip Use to check blood glucose once daily.  Dx:  E11.9  . Insulin Detemir (LEVEMIR FLEXPEN) 100 UNIT/ML Pen Inject 30 Units into the skin at bedtime.  . irbesartan (AVAPRO) 300 MG tablet TAKE 1 TABLET BY MOUTH EVERY DAY  . levothyroxine (SYNTHROID, LEVOTHROID) 150 MCG tablet Take 1 tablet (150 mcg total) by mouth daily before breakfast.  . linagliptin  (TRADJENTA) 5 MG TABS tablet Take 1 tablet (5 mg total) by mouth daily.  . metoprolol tartrate (LOPRESSOR) 100 MG tablet TAKE 1 TABLET BY MOUTH TWICE A DAY   No facility-administered encounter medications on file as of 06/13/2018.     Allergies (verified) Zocor [simvastatin - high dose]   History: Past Medical History:  Diagnosis Date  . Arthritis   . Diabetes mellitus without complication ([place])   . Heart attack ([place])   . Hyperlipidemia   . Hypertension   . MI (myocardial infarction) ([place]) 2000  . Renal disorder   . Sepsis ([place])   . Sepsis due to Streptococcus, group B ([place]) 02/24/2013  . Stroke ([place])   . Thyroid disease    hypothyroid   Past Surgical History:  Procedure Laterality Date  . TEE WITHOUT CARDIOVERSION N/A 02/26/2013   Procedure: TRANSESOPHAGEAL ECHOCARDIOGRAM (TEE);  Surgeon: Thayer Headings, MD;  Location: [facility] ENDOSCOPY;  Service: Cardiovascular;  Laterality: N/A;   Family History  Problem Relation Age of Onset  . Diabetes Mother   . Hypertension Mother   . Cancer Father   . Stroke Brother   . Alcohol abuse Brother   . Diabetes Brother   . Diabetes Sister   . Diabetes Brother 13       TYPE 1  . Kidney disease Brother 5       DIALYSIS  . Heart attack Brother   . Healthy Daughter   .  Healthy Son   . Healthy Daughter   . Healthy Son   . Healthy Son   . Healthy Son    Social History   Socioeconomic History  . Marital status: Married    Spouse name: Not on file  . Number of children: 6  . Years of education: 83  . Highest education level: 9th grade  Occupational History  . 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 Financial resource strain: Not hard at all  . Food insecurity:    Worry: Never true    Inability: Never true  . Transportation needs:    Medical: No    Non-medical: No  Tobacco Use  . Smoking status: Former Smoker    Packs/day: 0.50      Years: 30.00    Pack years: 15.00    Types: Cigarettes    Last attempt to quit: 11/05/1984    Years since quitting: 33.6  . Smokeless tobacco: Never Used  Substance and Sexual Activity  . Alcohol use: No  . Drug use: No  . Sexual activity: Yes  Lifestyle  . Physical activity:    Days per week: 3 days    Minutes per session: 20 min  . Stress: Not at all  Relationships  . Social connections:    Talks on phone: More than three times a week    Gets together: More than three times a week    Attends religious service: More than 4 times per year    Active member of club or organization: No    Attends meetings of clubs or organizations: Never    Relationship status: Married  Other Topics Concern  . Not on file  Social History Narrative  . Not on file    Tobacco Use No.  Clinical Intake:  Pre-visit preparation completed: No  Pain : No/denies pain     Nutritional Status: BMI > 30  Obese Diabetes: Yes CBG done?: No Did pt. bring in CBG monitor from home?: No  How often do you need to have someone help you when you read instructions, pamphlets, or other written materials from your doctor or pharmacy?: 3 - Sometimes What is the last grade level you completed in school?: 9th grade  Interpreter Needed?: No  Information entered by :: [name], RN  Activities of Daily Living In your present state of health, do you have any difficulty performing the following activities: 06/13/2018  Hearing? N  Vision? N  Comment plans on scheduling appt and will ask them to send a copy of the report  Difficulty concentrating or making decisions? N  Walking or climbing stairs? N  Dressing or bathing? N  Doing errands, shopping? N  Preparing Food and eating ? N  Using the Toilet? N  In the past six months, have you accidently leaked urine? N  Do you have problems with loss of bowel control? N  Managing your Medications? N  Comment uses a pill Scientist, clinical (histocompatibility and immunogenetics) your Finances? N   Housekeeping or managing your Housekeeping? N  Some recent data might be hidden    Diet Eats 3 meals a day. Eats mostly at home. Follows diet from endocrinologist Drinks a lot of water. Drinks a soda every once in a while.   Exercise Current Exercise Habits: Home exercise routine(stays busy around home and yard), Time (Minutes): 30, Frequency (Times/Week): 4, Weekly Exercise (Minutes/Week):  120, Intensity: Mild, Exercise limited by: neurologic condition(s)   Depression Screen PHQ 2/9 Scores 06/13/2018 04/18/2018 04/11/2018 01/09/2018  PHQ - 2 Score 0 0 0 0     Fall Risk Fall Risk  06/13/2018 04/18/2018 04/11/2018 01/09/2018 12/18/2017  Falls in the past year? No No No No No    Safety Is the patient's home free of loose throw rugs in walkways, pet beds, electrical cords, etc?   yes      Grab bars in the bathroom? no      Walkin shower? yes      Shower Seat? no      Handrails on the stairs?   yes      Adequate lighting?   yes  Patient Care Team: Dettinger, Fransisca Kaufmann, MD as PCP - General (Family Medicine) Melina Schools, OD as Consulting Physician (Optometry) Fleet Contras, MD as Consulting Physician (Nephrology)  Hospitalizations, surgeries, and ER visits in previous 12 months No hospitalizations, ER visits, or surgeries this past year.   Objective:    Today's Vitals   06/13/18 1008  BP: (!) 145/68  Pulse: 75  Weight: 249 lb (112.9 kg)  Height: 5\' 8"  (1.727 m)   Body mass index is 37.86 kg/m.  Advanced Directives 06/13/2018 04/27/2016 04/16/2015 02/25/2013 11/05/2012  Does Patient Have a Medical Advance Directive? No No No Patient does not have advance directive Patient does not have advance directive;Patient would like information  Would patient like information on creating a medical advance directive? No - Patient declined Yes - Scientist, clinical (histocompatibility and immunogenetics) given Yes - Scientist, clinical (histocompatibility and immunogenetics) given - Referral made to social work  Pre-existing out of facility DNR order (yellow form or  pink MOST form) - - - No No    Hearing/Vision  normal or No deficits noted during visit.   Cognitive Function: MMSE - Mini Mental State Exam 06/13/2018 04/27/2016 04/16/2015  Orientation to time 5 5 5   Orientation to Place 5 5 5   Registration 3 3 3   Attention/ Calculation 0 4 4  Attention/Calculation-comments Did not attempt. stated he wasn't very good at spelling - -  Recall 3 3 3   Language- name 2 objects 2 2 2   Language- repeat 1 1 1   Language- follow 3 step command 3 3 3   Language- read & follow direction 1 1 1   Write a sentence 0 1 1  Write a sentence-comments did not attempt due to literacy level - -  Copy design 0 1 1  Total score 23 29 29        Normal Cognitive Function Screening: Likely normal for literacy level and attempt.     Immunizations and Health Maintenance Immunization History  Administered Date(s) Administered  . Influenza Whole 08/25/2008  . Influenza, High Dose Seasonal PF 08/30/2016, 08/16/2017  . Influenza,inj,Quad PF,6+ Mos 08/19/2014, 08/25/2015  . Pneumococcal Conjugate-13 04/06/2015  . Pneumococcal Polysaccharide-23 10/06/2005, 12/12/2007   Health Maintenance Due  Topic Date Due  . OPHTHALMOLOGY EXAM  06/29/2016  . INFLUENZA VACCINE  06/13/2018   Health Maintenance  Topic Date Due  . OPHTHALMOLOGY EXAM  06/29/2016  . INFLUENZA VACCINE  06/13/2018  . TETANUS/TDAP  06/14/2019 (Originally 02/08/1960)  . FOOT EXAM  08/16/2018  . HEMOGLOBIN A1C  10/11/2018  . PNA vac Low Risk Adult  Completed        Assessment:   This is a routine wellness examination for Devan.      Plan:    Goals    . Exercise 150 min/wk Moderate Activity  Health Maintenance Recommendations: Diabetic eye exam-will ask them to send [place] a report Tdap-will talk with his wife and check cost later or TD if injured  Additional Screening Recommendations: Lung: Low Dose CT Chest recommended if Age 90-80 years, 30 pack-year currently smoking OR have quit w/in  15years. Patient does qualify. Hepatitis C Screening recommended: no  Today's Orders No orders of the defined types were placed in this encounter.   Keep f/u with Dettinger, Fransisca Kaufmann, MD and any other specialty appointments you may have Continue current medications Move carefully to avoid falls. Use assistive devices like a cane or walker if needed. Aim for at least 150 minutes of moderate activity a week. This can be chair exercises if necessary. Reading or puzzles are a good way to exercise your brain Stay connected with friends and family. Social connections are beneficial to your emotional and mental health. Take second dose of lasix in the afternoon around 2:00 or 3:00 instead of at bedtime   I have personally reviewed and noted the following in the patient's chart:   . Medical and social history . Use of alcohol, tobacco or illicit drugs  . Current medications and supplements . Functional ability and status . Nutritional status . Physical activity . Advanced directives . List of other physicians . Hospitalizations, surgeries, and ER visits in previous 12 months . Vitals . Screenings to include cognitive, depression, and falls . Referrals and appointments  In addition, I have reviewed and discussed with patient certain preventive protocols, quality metrics, and best practice recommendations. A written personalized care plan for preventive services as well as general preventive health recommendations were provided to patient.     [name], RN   06/13/2018

## 2018-06-13 NOTE — Patient Instructions (Signed)
  Michael Trevino , Thank you for taking time to come for your Medicare Wellness Visit. I appreciate your ongoing commitment to your health goals. Please review the following plan we discussed and let me know if I can assist you in the future.   These are the goals we discussed: Goals    . Exercise 150 min/wk Moderate Activity       This is a list of the screening recommended for you and due dates:  Health Maintenance  Topic Date Due  . Eye exam for diabetics  06/29/2016  . Flu Shot  06/13/2018  . Tetanus Vaccine  06/14/2019*  . Complete foot exam   08/16/2018  . Hemoglobin A1C  10/11/2018  . Pneumonia vaccines  Completed  *Topic was postponed. The date shown is not the original due date.

## 2018-06-19 ENCOUNTER — Other Ambulatory Visit: Payer: Self-pay | Admitting: Cardiology

## 2018-06-19 DIAGNOSIS — I359 Nonrheumatic aortic valve disorder, unspecified: Secondary | ICD-10-CM

## 2018-06-19 DIAGNOSIS — I2510 Atherosclerotic heart disease of native coronary artery without angina pectoris: Secondary | ICD-10-CM

## 2018-07-02 ENCOUNTER — Other Ambulatory Visit: Payer: Self-pay | Admitting: "Endocrinology

## 2018-07-16 ENCOUNTER — Ambulatory Visit: Payer: Medicare Other | Admitting: "Endocrinology

## 2018-07-18 ENCOUNTER — Other Ambulatory Visit: Payer: Self-pay

## 2018-07-18 ENCOUNTER — Ambulatory Visit (INDEPENDENT_AMBULATORY_CARE_PROVIDER_SITE_OTHER): Payer: Medicare Other

## 2018-07-18 DIAGNOSIS — I2510 Atherosclerotic heart disease of native coronary artery without angina pectoris: Secondary | ICD-10-CM

## 2018-07-18 DIAGNOSIS — I359 Nonrheumatic aortic valve disorder, unspecified: Secondary | ICD-10-CM

## 2018-07-25 ENCOUNTER — Other Ambulatory Visit: Payer: Self-pay | Admitting: "Endocrinology

## 2018-08-05 ENCOUNTER — Other Ambulatory Visit: Payer: Self-pay | Admitting: *Deleted

## 2018-08-05 MED ORDER — ATORVASTATIN CALCIUM 40 MG PO TABS: ORAL_TABLET | ORAL | 0 refills | Status: DC

## 2018-08-05 NOTE — Telephone Encounter (Signed)
OV 08/19/18

## 2018-08-19 ENCOUNTER — Encounter: Payer: Self-pay | Admitting: Family Medicine

## 2018-08-19 ENCOUNTER — Ambulatory Visit (INDEPENDENT_AMBULATORY_CARE_PROVIDER_SITE_OTHER): Payer: Medicare Other | Admitting: Family Medicine

## 2018-08-19 VITALS — BP 155/63 | HR 67 | Temp 97.2°F | Ht 68.0 in | Wt 248.6 lb

## 2018-08-19 DIAGNOSIS — IMO0002 Reserved for concepts with insufficient information to code with codable children: Secondary | ICD-10-CM

## 2018-08-19 DIAGNOSIS — Z23 Encounter for immunization: Secondary | ICD-10-CM

## 2018-08-19 DIAGNOSIS — N183 Chronic kidney disease, stage 3 unspecified: Secondary | ICD-10-CM

## 2018-08-19 DIAGNOSIS — I10 Essential (primary) hypertension: Secondary | ICD-10-CM

## 2018-08-19 DIAGNOSIS — E039 Hypothyroidism, unspecified: Secondary | ICD-10-CM

## 2018-08-19 DIAGNOSIS — E782 Mixed hyperlipidemia: Secondary | ICD-10-CM

## 2018-08-19 DIAGNOSIS — Z6837 Body mass index (BMI) 37.0-37.9, adult: Secondary | ICD-10-CM

## 2018-08-19 DIAGNOSIS — E1122 Type 2 diabetes mellitus with diabetic chronic kidney disease: Secondary | ICD-10-CM | POA: Diagnosis not present

## 2018-08-19 DIAGNOSIS — E66812 Obesity, class 2: Secondary | ICD-10-CM

## 2018-08-19 DIAGNOSIS — E1165 Type 2 diabetes mellitus with hyperglycemia: Secondary | ICD-10-CM

## 2018-08-19 MED ORDER — IRBESARTAN 300 MG PO TABS: ORAL_TABLET | ORAL | 3 refills | Status: DC

## 2018-08-19 MED ORDER — METOPROLOL TARTRATE 100 MG PO TABS: 100.0000 mg | ORAL_TABLET | Freq: Two times a day (BID) | ORAL | 3 refills | Status: DC

## 2018-08-19 MED ORDER — ATORVASTATIN CALCIUM 40 MG PO TABS: ORAL_TABLET | ORAL | 3 refills | Status: DC

## 2018-08-19 NOTE — Progress Notes (Signed)
BP (!) 155/63   Pulse 67   Temp (!) 97.2 F (36.2 C) (Oral)   Ht '5\' 8"'  (1.727 m)   Wt 248 lb 9.6 oz (112.8 kg)   BMI 37.80 kg/m    Subjective:    Patient ID: Michael Trevino, male    DOB: 14-Aug-1941, 77 y.o.   MRN: [id number]  HPI: Michael Trevino is a 77 y.o. male presenting on 08/19/2018 for Diabetes (4 month follow up); Hypertension; and Establish Care ([place])   HPI Hypertension Patient is currently on metoprolol and irbesartan and amlodipine, and their blood pressure today is 155/63 but he has multiple numbers at home with meter that shows 125/53 and 116/53 and top blood pressure in the 948 range systolic. Patient denies any lightheadedness or dizziness. Patient denies headaches, blurred vision, chest pains, shortness of breath, or weakness. Denies any side effects from medication and is content with current medication.   Type 2 diabetes mellitus Patient's thyroid and diabetes and cholesterol mostly managed by his endocrinologist.  Patient comes in today for recheck of his diabetes. Patient has been currently taking Tradjenta and Levemir 30 and glimepiride 2, patient denies any hypoglycemic episodes. Patient is currently on an ACE inhibitor/ARB. Patient has not seen an ophthalmologist this year. Patient denies any issues with their feet.  Patient does have known stage III CKD and has a nephrologist that he sees.  Hyperlipidemia Patient is coming in for recheck of his hyperlipidemia. The patient is currently taking atorvastatin. They deny any issues with myalgias or history of liver damage from it. They deny any focal numbness or weakness or chest pain.   Hypothyroidism recheck Patient is coming in for thyroid recheck today as well. They deny any issues with hair changes or heat or cold problems or diarrhea or constipation. They deny any chest pain or palpitations. They are currently on levothyroxine 150 micrograms   Relevant past medical, surgical, family and social history  reviewed and updated as indicated. Interim medical history since our last visit reviewed. Allergies and medications reviewed and updated.  Review of Systems  Constitutional: Negative for chills and fever.  Eyes: Negative for visual disturbance.  Respiratory: Negative for shortness of breath and wheezing.   Cardiovascular: Negative for chest pain and leg swelling.  Musculoskeletal: Negative for back pain and gait problem.  Skin: Negative for rash.  Neurological: Negative for dizziness, weakness and light-headedness.  All other systems reviewed and are negative.   Per HPI unless specifically indicated above   Allergies as of 08/19/2018      Reactions   Zocor [simvastatin - High Dose] Nausea Only   Unknown      Medication List        Accurate as of 08/19/18  8:11 AM. Always use your most recent med list.          amLODipine 10 MG tablet Commonly known as:  NORVASC Take 1 tablet (10 mg total) by mouth daily.   ascorbic acid 500 MG tablet Commonly known as:  VITAMIN C Take 500 mg by mouth daily.   aspirin EC 81 MG tablet Take 81 mg by mouth daily.   atorvastatin 40 MG tablet Commonly known as:  LIPITOR TAKE 1 TABLET BY MOUTH EVERY DAY   Calcium Carbonate 500 MG Chew Chew 1 tablet (500 mg total) by mouth 3 (three) times daily as needed.   furosemide 80 MG tablet Commonly known as:  LASIX Take 80 mg by mouth 2 (two) times daily.   glimepiride  2 MG tablet Commonly known as:  AMARYL TAKE 1 TABLET (2 MG TOTAL) BY MOUTH DAILY WITH BREAKFAST.   glucose blood test strip Use to check blood glucose once daily.  Dx:  E11.9   Insulin Detemir 100 UNIT/ML Pen Commonly known as:  LEVEMIR Inject 30 Units into the skin at bedtime.   irbesartan 300 MG tablet Commonly known as:  AVAPRO TAKE 1 TABLET BY MOUTH EVERY DAY   levothyroxine 150 MCG tablet Commonly known as:  SYNTHROID, LEVOTHROID Take 1 tablet (150 mcg total) by mouth daily before breakfast.   linagliptin 5 MG  Tabs tablet Commonly known as:  TRADJENTA Take 1 tablet (5 mg total) by mouth daily.   metoprolol tartrate 100 MG tablet Commonly known as:  LOPRESSOR TAKE 1 TABLET BY MOUTH TWICE A DAY          Objective:    BP (!) 155/63   Pulse 67   Temp (!) 97.2 F (36.2 C) (Oral)   Ht '5\' 8"'  (1.727 m)   Wt 248 lb 9.6 oz (112.8 kg)   BMI 37.80 kg/m   Wt Readings from Last 3 Encounters:  08/19/18 248 lb 9.6 oz (112.8 kg)  06/13/18 249 lb (112.9 kg)  04/18/18 244 lb 6.4 oz (110.9 kg)    Physical Exam  Constitutional: He is oriented to person, place, and time. He appears well-developed and well-nourished. No distress.  Eyes: Conjunctivae are normal. No scleral icterus.  Neck: Neck supple. No thyromegaly present.  Cardiovascular: Normal rate, regular rhythm, normal heart sounds and intact distal pulses.  No murmur heard. Pulmonary/Chest: Effort normal and breath sounds normal. No respiratory distress. He has no wheezes.  Musculoskeletal: Normal range of motion. He exhibits no edema.  Lymphadenopathy:    He has no cervical adenopathy.  Neurological: He is alert and oriented to person, place, and time. Coordination normal.  Skin: Skin is warm and dry. No rash noted. He is not diaphoretic.  Psychiatric: He has a normal mood and affect. His behavior is normal.  Nursing note and vitals reviewed.       Assessment & Plan:   Problem List Items Addressed This Visit      Cardiovascular and Mediastinum   HTN (hypertension) - Primary   Relevant Medications   atorvastatin (LIPITOR) 40 MG tablet   irbesartan (AVAPRO) 300 MG tablet   metoprolol tartrate (LOPRESSOR) 100 MG tablet     Endocrine   Hypothyroidism   Relevant Medications   metoprolol tartrate (LOPRESSOR) 100 MG tablet   Other Relevant Orders   CBC with Differential/Platelet   TSH   Uncontrolled type 2 diabetes mellitus with stage 3 chronic kidney disease (HCC)   Relevant Medications   atorvastatin (LIPITOR) 40 MG tablet    irbesartan (AVAPRO) 300 MG tablet   Other Relevant Orders   CMP14+EGFR   Bayer DCA Hb A1c Waived     Genitourinary   CKD (chronic kidney disease) stage 3, GFR 30-59 ml/min (HCC)   Relevant Orders   CMP14+EGFR   CBC with Differential/Platelet     Other   Mixed hyperlipidemia   Relevant Medications   atorvastatin (LIPITOR) 40 MG tablet   irbesartan (AVAPRO) 300 MG tablet   metoprolol tartrate (LOPRESSOR) 100 MG tablet   Other Relevant Orders   Lipid panel   Class 2 severe obesity due to excess calories with serious comorbidity and body mass index (BMI) of 37.0 to 37.9 in adult [facility])    Other Visit Diagnoses    Encounter  for immunization       Relevant Orders   Flu vaccine HIGH DOSE PF (Completed)       Follow up plan: Return in about 3 months (around 11/19/2018), or if symptoms worsen or fail to improve, for Diabetes and hypertension recheck.  Counseling provided for all of the vaccine components No orders of the defined types were placed in this encounter.   Caryl Pina, MD [facility] 08/19/2018, 8:11 AM

## 2018-08-26 ENCOUNTER — Other Ambulatory Visit: Payer: Medicare Other

## 2018-08-26 DIAGNOSIS — E1165 Type 2 diabetes mellitus with hyperglycemia: Secondary | ICD-10-CM | POA: Diagnosis not present

## 2018-08-26 DIAGNOSIS — E782 Mixed hyperlipidemia: Secondary | ICD-10-CM | POA: Diagnosis not present

## 2018-08-26 DIAGNOSIS — E1122 Type 2 diabetes mellitus with diabetic chronic kidney disease: Secondary | ICD-10-CM

## 2018-08-26 DIAGNOSIS — IMO0002 Reserved for concepts with insufficient information to code with codable children: Secondary | ICD-10-CM

## 2018-08-26 DIAGNOSIS — E039 Hypothyroidism, unspecified: Secondary | ICD-10-CM | POA: Diagnosis not present

## 2018-08-26 DIAGNOSIS — N183 Chronic kidney disease, stage 3 unspecified: Secondary | ICD-10-CM

## 2018-08-26 LAB — BAYER DCA HB A1C WAIVED: HB A1C (BAYER DCA - WAIVED): 8.6 % — ABNORMAL HIGH (ref <7.0)

## 2018-08-26 LAB — HEMOGLOBIN A1C: Hgb A1c MFr Bld: 8.6 — AB (ref 4.0–6.0)

## 2018-08-27 LAB — CBC WITH DIFFERENTIAL/PLATELET
Basophils Absolute: 0 10*3/uL (ref 0.0–0.2)
Basos: 0 %
EOS (ABSOLUTE): 0 10*3/uL (ref 0.0–0.4)
Eos: 1 %
Hematocrit: 35.8 % — ABNORMAL LOW (ref 37.5–51.0)
Hemoglobin: 11.9 g/dL — ABNORMAL LOW (ref 13.0–17.7)
Immature Grans (Abs): 0 10*3/uL (ref 0.0–0.1)
Immature Granulocytes: 0 %
Lymphocytes Absolute: 2.3 10*3/uL (ref 0.7–3.1)
Lymphs: 48 %
MCH: 28.9 pg (ref 26.6–33.0)
MCHC: 33.2 g/dL (ref 31.5–35.7)
MCV: 87 fL (ref 79–97)
Monocytes Absolute: 0.3 10*3/uL (ref 0.1–0.9)
Monocytes: 7 %
Neutrophils Absolute: 2.1 10*3/uL (ref 1.4–7.0)
Neutrophils: 44 %
Platelets: 183 10*3/uL (ref 150–450)
RBC: 4.12 x10E6/uL — ABNORMAL LOW (ref 4.14–5.80)
RDW: 15.1 % (ref 12.3–15.4)
WBC: 4.7 10*3/uL (ref 3.4–10.8)

## 2018-08-27 LAB — CMP14+EGFR
ALT: 16 IU/L (ref 0–44)
AST: 16 IU/L (ref 0–40)
Albumin/Globulin Ratio: 1.2 (ref 1.2–2.2)
Albumin: 3.2 g/dL — ABNORMAL LOW (ref 3.5–4.8)
Alkaline Phosphatase: 84 IU/L (ref 39–117)
BUN/Creatinine Ratio: 9 — ABNORMAL LOW (ref 10–24)
BUN: 18 mg/dL (ref 8–27)
Bilirubin Total: <0.2 mg/dL (ref 0.0–1.2)
CO2: 23 mmol/L (ref 20–29)
Calcium: 7.7 mg/dL — ABNORMAL LOW (ref 8.6–10.2)
Chloride: 109 mmol/L — ABNORMAL HIGH (ref 96–106)
Creatinine, Ser: 2 mg/dL — ABNORMAL HIGH (ref 0.76–1.27)
GFR calc Af Amer: 36 mL/min/{1.73_m2} — ABNORMAL LOW (ref >59)
GFR calc non Af Amer: 31 mL/min/{1.73_m2} — ABNORMAL LOW (ref >59)
Globulin, Total: 2.7 g/dL (ref 1.5–4.5)
Glucose: 156 mg/dL — ABNORMAL HIGH (ref 65–99)
Potassium: 4.2 mmol/L (ref 3.5–5.2)
Sodium: 145 mmol/L — ABNORMAL HIGH (ref 134–144)
Total Protein: 5.9 g/dL — ABNORMAL LOW (ref 6.0–8.5)

## 2018-08-27 LAB — LIPID PANEL
Chol/HDL Ratio: 5.2 ratio — ABNORMAL HIGH (ref 0.0–5.0)
Cholesterol, Total: 151 mg/dL (ref 100–199)
HDL: 29 mg/dL — ABNORMAL LOW (ref >39)
LDL Calculated: 86 mg/dL (ref 0–99)
Triglycerides: 179 mg/dL — ABNORMAL HIGH (ref 0–149)
VLDL Cholesterol Cal: 36 mg/dL (ref 5–40)

## 2018-08-27 LAB — TSH: TSH: 5.54 u[IU]/mL — ABNORMAL HIGH (ref 0.450–4.500)

## 2018-08-28 ENCOUNTER — Other Ambulatory Visit: Payer: Self-pay | Admitting: "Endocrinology

## 2018-08-28 ENCOUNTER — Ambulatory Visit: Payer: Medicare Other | Admitting: "Endocrinology

## 2018-08-28 ENCOUNTER — Encounter: Payer: Self-pay | Admitting: "Endocrinology

## 2018-08-28 VITALS — BP 154/60 | HR 71 | Ht 68.0 in | Wt 251.0 lb

## 2018-08-28 DIAGNOSIS — E1165 Type 2 diabetes mellitus with hyperglycemia: Secondary | ICD-10-CM

## 2018-08-28 DIAGNOSIS — IMO0002 Reserved for concepts with insufficient information to code with codable children: Secondary | ICD-10-CM

## 2018-08-28 DIAGNOSIS — E1121 Type 2 diabetes mellitus with diabetic nephropathy: Secondary | ICD-10-CM

## 2018-08-28 DIAGNOSIS — E782 Mixed hyperlipidemia: Secondary | ICD-10-CM

## 2018-08-28 DIAGNOSIS — I10 Essential (primary) hypertension: Secondary | ICD-10-CM

## 2018-08-28 DIAGNOSIS — E039 Hypothyroidism, unspecified: Secondary | ICD-10-CM | POA: Diagnosis not present

## 2018-08-28 DIAGNOSIS — E1122 Type 2 diabetes mellitus with diabetic chronic kidney disease: Secondary | ICD-10-CM

## 2018-08-28 DIAGNOSIS — N183 Chronic kidney disease, stage 3 unspecified: Secondary | ICD-10-CM

## 2018-08-28 DIAGNOSIS — Z794 Long term (current) use of insulin: Secondary | ICD-10-CM

## 2018-08-28 MED ORDER — INSULIN DETEMIR 100 UNIT/ML FLEXPEN: 35.0000 [IU] | PEN_INJECTOR | Freq: Every day | SUBCUTANEOUS | 2 refills | Status: DC

## 2018-08-28 NOTE — Patient Instructions (Signed)

## 2018-08-28 NOTE — Progress Notes (Signed)
Endocrinology follow-up note       08/28/2018, 9:37 AM   Subjective:    Patient ID: Michael Trevino, male    DOB: 1941/05/28.  he is being seen in follow-up for management of currently uncontrolled symptomatic type 2 diabetes, hyperlipidemia, hypertension, and hypothyroidism. PMD:   Michael Trevino, Michael Kaufmann, MD.   Past Medical History:  Diagnosis Date  . Arthritis   . Diabetes mellitus without complication ([place])   . Heart attack ([place])   . Hyperlipidemia   . Hypertension   . MI (myocardial infarction) ([place]) 2000  . Renal disorder   . Sepsis ([place])   . Sepsis due to Streptococcus, group B ([place]) 02/24/2013  . Stroke ([place])   . Thyroid disease    hypothyroid   Past Surgical History:  Procedure Laterality Date  . TEE WITHOUT CARDIOVERSION N/A 02/26/2013   Procedure: TRANSESOPHAGEAL ECHOCARDIOGRAM (TEE);  Surgeon: Thayer Headings, MD;  Location: [facility] ENDOSCOPY;  Service: Cardiovascular;  Laterality: N/A;   Social History   Socioeconomic History  . Marital status: Married    Spouse name: Not on file  . Number of children: 6  . Years of education: 2  . Highest education level: 9th grade  Occupational History  . 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 Financial resource strain: Not hard at all  . Food insecurity:    Worry: Never true    Inability: Never true  . Transportation needs:    Medical: No    Non-medical: No  Tobacco Use  . Smoking status: Former Smoker    Packs/day: 0.50    Years: 30.00    Pack years: 15.00    Types: Cigarettes    Last attempt to quit: 11/05/1984    Years since quitting: 33.8  . Smokeless tobacco: Never Used  Substance and Sexual Activity  . Alcohol use: No  . Drug use: No  . Sexual activity: Yes  Lifestyle  . Physical activity:    Days per week: 3 days    Minutes per  session: 20 min  . Stress: Not at all  Relationships  . Social connections:    Talks on phone: More than three times a week    Gets together: More than three times a week    Attends religious service: More than 4 times per year    Active member of club or organization: No    Attends meetings of clubs or organizations: Never    Relationship status: Married  Other Topics Concern  . Not on file  Social History Narrative  . Not on file   Outpatient Encounter Medications as of 08/28/2018  Medication Sig  . amLODipine (NORVASC) 10 MG tablet Take 1 tablet (10 mg total) by mouth daily.  Marland Kitchen ascorbic acid (VITAMIN C) 500 MG tablet Take 500 mg by mouth daily.  Marland Kitchen aspirin EC 81 MG tablet Take 81 mg by mouth daily.  Marland Kitchen atorvastatin (LIPITOR) 40 MG tablet TAKE 1 TABLET BY MOUTH EVERY DAY  . Calcium Carbonate 500 MG CHEW Chew  1 tablet (500 mg total) by mouth 3 (three) times daily as needed. (Patient taking differently: Chew 500 mg by mouth daily. )  . furosemide (LASIX) 80 MG tablet Take 80 mg by mouth 2 (two) times daily.   Marland Kitchen glimepiride (AMARYL) 2 MG tablet TAKE 1 TABLET (2 MG TOTAL) BY MOUTH DAILY WITH BREAKFAST.  Marland Kitchen glucose blood test strip Use to check blood glucose once daily.  Dx:  E11.9  . Insulin Detemir (LEVEMIR FLEXPEN) 100 UNIT/ML Pen Inject 35 Units into the skin at bedtime.  . irbesartan (AVAPRO) 300 MG tablet TAKE 1 TABLET BY MOUTH EVERY DAY  . levothyroxine (SYNTHROID, LEVOTHROID) 150 MCG tablet Take 1 tablet (150 mcg total) by mouth daily before breakfast.  . linagliptin (TRADJENTA) 5 MG TABS tablet Take 1 tablet (5 mg total) by mouth daily.  . metoprolol tartrate (LOPRESSOR) 100 MG tablet Take 1 tablet (100 mg total) by mouth 2 (two) times daily.  . [DISCONTINUED] Insulin Detemir (LEVEMIR FLEXPEN) 100 UNIT/ML Pen Inject 30 Units into the skin at bedtime.   No facility-administered encounter medications on file as of 08/28/2018.     ALLERGIES: Allergies  Allergen Reactions  .  Zocor [Simvastatin - High Dose] Nausea Only    Unknown    VACCINATION STATUS: Immunization History  Administered Date(s) Administered  . Influenza Whole 08/25/2008  . Influenza, High Dose Seasonal PF 08/30/2016, 08/16/2017, 08/19/2018  . Influenza,inj,Quad PF,6+ Mos 08/19/2014, 08/25/2015  . Pneumococcal Conjugate-13 04/06/2015  . Pneumococcal Polysaccharide-23 10/06/2005, 12/12/2007    Diabetes  He presents for his follow-up diabetic visit. He has type 2 diabetes mellitus. Onset time: He was diagnosed at approximate age of 89 years. His disease course has been worsening. There are no hypoglycemic associated symptoms. Pertinent negatives for hypoglycemia include no confusion, headaches, pallor or seizures. Pertinent negatives for diabetes include no blurred vision, no chest pain, no fatigue, no polydipsia, no polyphagia, no polyuria and no weakness. There are no hypoglycemic complications. Symptoms are worsening. Diabetic complications include heart disease and nephropathy. Risk factors for coronary artery disease include diabetes mellitus, dyslipidemia, family history, hypertension, male sex, obesity, sedentary lifestyle and tobacco exposure. Current diabetic treatment includes insulin injections and oral agent (dual therapy). His weight is stable. He is following a generally unhealthy diet. When asked about meal planning, he reported none. He has not had a previous visit with a dietitian. He never participates in exercise. His breakfast blood glucose range is generally 140-180 mg/dl. His bedtime blood glucose range is generally 180-200 mg/dl. His overall blood glucose range is 180-200 mg/dl. An ACE inhibitor/angiotensin II receptor blocker is being taken. He does not see a podiatrist.Eye exam is current.  Hyperlipidemia  This is a chronic problem. The current episode started more than 1 year ago. Exacerbating diseases include diabetes, hypothyroidism and obesity. Pertinent negatives include no  chest pain, myalgias or shortness of breath. Current antihyperlipidemic treatment includes statins. Risk factors for coronary artery disease include diabetes mellitus, dyslipidemia, hypertension, male sex, family history, obesity and a sedentary lifestyle.  Hypertension  This is a chronic problem. The current episode started more than 1 year ago. The problem is uncontrolled. Pertinent negatives include no blurred vision, chest pain, headaches, neck pain, palpitations or shortness of breath. Risk factors for coronary artery disease include dyslipidemia, diabetes mellitus, family history, male gender, obesity, sedentary lifestyle and smoking/tobacco exposure. Past treatments include angiotensin blockers and calcium channel blockers. Hypertensive end-organ damage includes kidney disease and CAD/MI.     Review of Systems  Constitutional: Negative for chills, fatigue, fever and unexpected weight change.  HENT: Negative for dental problem, mouth sores and trouble swallowing.   Eyes: Negative for blurred vision and visual disturbance.  Respiratory: Negative for cough, choking, chest tightness, shortness of breath and wheezing.   Cardiovascular: Negative for chest pain, palpitations and leg swelling.  Gastrointestinal: Negative for abdominal distention, abdominal pain, constipation, diarrhea, nausea and vomiting.  Endocrine: Negative for polydipsia, polyphagia and polyuria.  Genitourinary: Negative for dysuria, flank pain, hematuria and urgency.  Musculoskeletal: Negative for back pain, gait problem, myalgias and neck pain.  Skin: Negative for pallor, rash and wound.  Neurological: Negative for seizures, syncope, weakness, numbness and headaches.  Psychiatric/Behavioral: Negative for confusion and dysphoric mood.    Objective:    BP (!) 154/60   Pulse 71   Ht 5\' 8"  (1.727 m)   Wt 251 lb (113.9 kg)   BMI 38.16 kg/m   Wt Readings from Last 3 Encounters:  08/28/18 251 lb (113.9 kg)  08/19/18 248  lb 9.6 oz (112.8 kg)  06/13/18 249 lb (112.9 kg)     Physical Exam  Constitutional: He is oriented to person, place, and time. He appears well-developed. He is cooperative. No distress.  Patient is unable to read and write  HENT:  Head: Normocephalic and atraumatic.  Eyes: EOM are normal.  Neck: Normal range of motion. Neck supple. No tracheal deviation present. No thyromegaly present.  Cardiovascular: Normal rate, S1 normal, S2 normal and normal heart sounds. Exam reveals no gallop.  No murmur heard. Pulses:      Dorsalis pedis pulses are 1+ on the right side, and 1+ on the left side.       Posterior tibial pulses are 1+ on the right side, and 1+ on the left side.  Pulmonary/Chest: Breath sounds normal. No respiratory distress. He has no wheezes.  Abdominal: Soft. Bowel sounds are normal. He exhibits no distension. There is no tenderness. There is no guarding and no CVA tenderness.  Musculoskeletal: He exhibits no edema.       Right shoulder: He exhibits no swelling and no deformity.  Neurological: He is alert and oriented to person, place, and time. He has normal strength. No cranial nerve deficit or sensory deficit. Gait normal.  Patient is hard of hearing.  Skin: Skin is warm and dry. No rash noted. No cyanosis. Nails show no clubbing.  Psychiatric: He has a normal mood and affect. His speech is normal. Cognition and memory are normal.  Patient is illiterate.   CMP     Component Value Date/Time   NA 145 (H) 08/26/2018 0916   K 4.2 08/26/2018 0916   CL 109 (H) 08/26/2018 0916   CO2 23 08/26/2018 0916   GLUCOSE 156 (H) 08/26/2018 0916   GLUCOSE 161 (H) 02/27/2013 0750   BUN 18 08/26/2018 0916   CREATININE 2.00 (H) 08/26/2018 0916   CALCIUM 7.7 (L) 08/26/2018 0916   PROT 5.9 (L) 08/26/2018 0916   ALBUMIN 3.2 (L) 08/26/2018 0916   AST 16 08/26/2018 0916   ALT 16 08/26/2018 0916   ALKPHOS 84 08/26/2018 0916   BILITOT <0.2 08/26/2018 0916   GFRNONAA 31 (L) 08/26/2018 0916    GFRAA 36 (L) 08/26/2018 0916     Diabetic Labs (most recent): Lab Results  Component Value Date   HGBA1C 8.6 (A) 08/26/2018   HGBA1C 8.3 (H) 04/10/2018   HGBA1C 8.1 (H) 01/07/2018     Lipid Panel ( most recent) Lipid Panel  Component Value Date/Time   CHOL 151 08/26/2018 0916   CHOL 108 04/11/2013 1153   TRIG 179 (H) 08/26/2018 0916   TRIG 98 07/17/2013 0940   TRIG 131 04/11/2013 1153   HDL 29 (L) 08/26/2018 0916   HDL 31 (L) 07/17/2013 0940   HDL 33 (L) 04/11/2013 1153   CHOLHDL 5.2 (H) 08/26/2018 0916   LDLCALC 86 08/26/2018 0916   LDLCALC 49 07/17/2013 0940   LDLCALC 49 04/11/2013 1153      Lab Results  Component Value Date   TSH 5.540 (H) 08/26/2018   TSH 6.390 (H) 04/10/2018   TSH 8.370 (H) 01/07/2018   TSH 8.040 (H) 08/16/2017   TSH 7.640 (H) 04/16/2017   TSH 9.510 (H) 10/20/2016   TSH 7.360 (H) 04/11/2016   TSH 8.300 (H) 01/10/2016   TSH 3.080 07/14/2015   TSH 4.550 (H) 04/06/2015   FREET4 1.42 04/10/2018   FREET4 1.22 01/07/2018           Assessment & Plan:   1. DM type 2 causing vascular disease , Stage 3 renal insufficiency.  - Patient has currently uncontrolled symptomatic type 2 DM since  76 years of age. - Patient came with continued engagement , slightly above target glycemic profile with A1c of 8.6%, increasing from 8.3% during his last visit. -Recent labs reviewed.  -his diabetes is complicated by coronary artery disease, stage 3 renal insufficiency, obesity/sedentary life, illiteracy  and Michael Trevino remains at a high risk for more acute and chronic complications which include CAD, CVA, CKD, retinopathy, and neuropathy. These are all discussed in detail with the patient.  - I have counseled him on diet management and weight loss, by adopting a carbohydrate restricted/protein rich diet. -He still admits to dietary indiscretions including consumption of sweetened beverages. -  Suggestion is made for him to avoid simple carbohydrates   from his diet including Cakes, Sweet Desserts / Pastries, Ice Cream, Soda (diet and regular), Sweet Tea, Candies, Chips, Cookies, Store Bought Juices, Alcohol in Excess of  1-2 drinks a day, Artificial Sweeteners, and "Sugar-free" Products. This will help patient to have stable blood glucose profile and potentially avoid unintended weight gain.   - I encouraged him to switch to  unprocessed or minimally processed complex starch and increased protein intake (animal or plant source), fruits, and vegetables.  - he is advised to stick to a routine mealtimes to eat 3 meals  a day and avoid unnecessary snacks ( to snack only to correct hypoglycemia).    - I have approached him with the following individualized plan to manage diabetes and patient agrees:    -Patient is illiterate, being helped by his elderly wife who can read and write.   -The #1 goal in his diabetes management will still be to avoid hypoglycemia.  -Target A1c for him will be between 8% and 8.5%.   - I will attempt to keep treatment simple for them to follow. -He is advised to increase Levemir to 35 units at bedtime daily, continue strict monitoring of blood glucose 2 times daily- before breakfast and at bedtime, and as needed anytime.    - Patient is warned not to take insulin without proper monitoring per orders.  -Patient is encouraged to call clinic for blood glucose levels less than 70 or above 300 mg /dl. -Advised to  continue Tradjenta 5 mg by mouth every morning therapeutically suitable for patient . -Patient is not a candidate for metformin and SGLT2 inhibitors due to CKD. -  For lack of better choices, I continued his glimepiride at lower dose of 2 mg daily with breakfast.   - Patient specific target  A1c;  LDL, HDL, Triglycerides, and  Waist Circumference were discussed in detail.  2) BP/HTN: His blood pressure is not controlled to target.   He admits to have missed his morning blood pressure medications today.  He is  advised to continue his current medications, including amlodipine 10 mg p.o. daily, Lasix 80 mg p.o. twice daily, irbesartan 300 mg p.o. daily, and metoprolol 100 mg p.o. twice daily.    3) Lipids/HPL : His most recent lipid panel showed controlled LDL at 63.   He is advised to continue atorvastatin 40 mg p.o. nightly.    4)  Weight/Diet: CDE Consult will be initiated , exercise, and detailed carbohydrates information provided.  5) hypothyroidism: Long-standing diagnosis -His thyroid function tests are consistent with inadequate replacement, showing significant improvement.  His current dose of levothyroxine at 150 mcg is adequate.   -He is advised to be consistent taking this medication in the morning before breakfast.   - We discussed about correct intake of levothyroxine, at fasting, with water, separated by at least 30 minutes from breakfast, and separated by more than 4 hours from calcium, iron, multivitamins, acid reflux medications (PPIs). -Patient is made aware of the fact that thyroid hormone replacement is needed for life, dose to be adjusted by periodic monitoring of thyroid function tests.  6) Chronic Care/Health Maintenance:  -he  is on ACEI/ARB and Statin medications and  is encouraged to continue to follow up with Ophthalmology, Dentist,  Podiatrist at least yearly or according to recommendations, and advised to  stay away from smoking. I have recommended yearly flu vaccine and pneumonia vaccination at least every 5 years; and  sleep for at least 7 hours a day. - I advised patient to maintain close follow up with Michael Trevino, Michael Kaufmann, MD for primary care needs.  - Time spent with the patient: 25 min, of which >50% was spent in reviewing his blood glucose logs , discussing his hypo- and hyper-glycemic episodes, reviewing his current and  previous labs and insulin doses and developing a plan to avoid hypo- and hyper-glycemia. Please refer to Patient Instructions for Blood Glucose  Monitoring and Insulin/Medications Dosing Guide"  in media tab for additional information. Michael Trevino participated in the discussions, expressed understanding, and voiced agreement with the above plans.  All questions were answered to his satisfaction. he is encouraged to contact clinic should he have any questions or concerns prior to his return visit.   Follow up plan: - Return in about 3 months (around 11/28/2018) for Follow up with Pre-visit Labs, Meter, and Logs.   Glade Lloyd, MD [facility] [address] [place], [place] [address] Phone: [phone]  Fax: [phone]    08/28/2018, 9:37 AM  This note was partially dictated with voice recognition software. Similar sounding words can be transcribed inadequately or may not  be corrected upon review.

## 2018-08-29 ENCOUNTER — Other Ambulatory Visit: Payer: Self-pay | Admitting: *Deleted

## 2018-08-29 DIAGNOSIS — E1121 Type 2 diabetes mellitus with diabetic nephropathy: Secondary | ICD-10-CM

## 2018-08-29 DIAGNOSIS — Z794 Long term (current) use of insulin: Principal | ICD-10-CM

## 2018-08-29 MED ORDER — LEVOTHYROXINE SODIUM 175 MCG PO CAPS: 175.0000 ug | ORAL_CAPSULE | Freq: Every day | ORAL | 1 refills | Status: DC

## 2018-09-09 ENCOUNTER — Telehealth: Payer: Self-pay | Admitting: Family Medicine

## 2018-09-09 MED ORDER — OLMESARTAN MEDOXOMIL 40 MG PO TABS: 40.0000 mg | ORAL_TABLET | Freq: Every day | ORAL | 1 refills | Status: DC

## 2018-09-09 NOTE — Telephone Encounter (Signed)
I sent in olmesartan for the patient

## 2018-09-09 NOTE — Telephone Encounter (Signed)
Patient aware, medication has been sent to pharmacy

## 2018-09-23 ENCOUNTER — Other Ambulatory Visit: Payer: Self-pay | Admitting: "Endocrinology

## 2018-09-30 ENCOUNTER — Telehealth: Payer: Self-pay | Admitting: Family Medicine

## 2018-09-30 MED ORDER — AMLODIPINE BESYLATE 10 MG PO TABS: 10.0000 mg | ORAL_TABLET | Freq: Every day | ORAL | 1 refills | Status: DC

## 2018-09-30 NOTE — Telephone Encounter (Signed)
Aware rx on file

## 2018-09-30 NOTE — Telephone Encounter (Signed)
What is the name of the medication? amLODipine (NORVASC) 10 MG tablet and  metoprolol tartrate (LOPRESSOR) 100 MG tablet  Have you contacted your pharmacy to request a refill? No, came in to office today  Which pharmacy would you like this sent to? [facility]   Patient notified that their request is being sent to the clinical staff for review and that they should receive a call once it is complete. If they do not receive a call within 24 hours they can check with their pharmacy or our office.

## 2018-10-17 ENCOUNTER — Other Ambulatory Visit: Payer: Self-pay | Admitting: "Endocrinology

## 2018-12-03 ENCOUNTER — Ambulatory Visit: Payer: Medicare Other | Admitting: "Endocrinology

## 2018-12-13 DIAGNOSIS — E1129 Type 2 diabetes mellitus with other diabetic kidney complication: Secondary | ICD-10-CM | POA: Diagnosis not present

## 2018-12-13 DIAGNOSIS — I129 Hypertensive chronic kidney disease with stage 1 through stage 4 chronic kidney disease, or unspecified chronic kidney disease: Secondary | ICD-10-CM | POA: Diagnosis not present

## 2018-12-13 DIAGNOSIS — E1122 Type 2 diabetes mellitus with diabetic chronic kidney disease: Secondary | ICD-10-CM | POA: Diagnosis not present

## 2018-12-13 DIAGNOSIS — I351 Nonrheumatic aortic (valve) insufficiency: Secondary | ICD-10-CM | POA: Diagnosis not present

## 2018-12-13 DIAGNOSIS — E039 Hypothyroidism, unspecified: Secondary | ICD-10-CM | POA: Diagnosis not present

## 2018-12-13 DIAGNOSIS — N183 Chronic kidney disease, stage 3 (moderate): Secondary | ICD-10-CM | POA: Diagnosis not present

## 2018-12-13 LAB — HEMOGLOBIN A1C: Hemoglobin A1C: 8.3

## 2018-12-13 LAB — BASIC METABOLIC PANEL
BUN: 21 (ref 4–21)
Creatinine: 2.4 — AB (ref 0.6–1.3)

## 2018-12-13 LAB — MICROALBUMIN, URINE: Microalb, Ur: 6261.3

## 2018-12-13 LAB — TSH: TSH: 0.07 — AB (ref 0.41–5.90)

## 2018-12-19 ENCOUNTER — Ambulatory Visit (INDEPENDENT_AMBULATORY_CARE_PROVIDER_SITE_OTHER): Payer: Medicare Other | Admitting: "Endocrinology

## 2018-12-19 ENCOUNTER — Encounter: Payer: Self-pay | Admitting: "Endocrinology

## 2018-12-19 VITALS — BP 145/70 | HR 85 | Ht 68.0 in | Wt 254.0 lb

## 2018-12-19 DIAGNOSIS — Z794 Long term (current) use of insulin: Secondary | ICD-10-CM

## 2018-12-19 DIAGNOSIS — IMO0002 Reserved for concepts with insufficient information to code with codable children: Secondary | ICD-10-CM

## 2018-12-19 DIAGNOSIS — E1122 Type 2 diabetes mellitus with diabetic chronic kidney disease: Secondary | ICD-10-CM | POA: Diagnosis not present

## 2018-12-19 DIAGNOSIS — E039 Hypothyroidism, unspecified: Secondary | ICD-10-CM | POA: Diagnosis not present

## 2018-12-19 DIAGNOSIS — N183 Chronic kidney disease, stage 3 unspecified: Secondary | ICD-10-CM

## 2018-12-19 DIAGNOSIS — E1165 Type 2 diabetes mellitus with hyperglycemia: Secondary | ICD-10-CM

## 2018-12-19 DIAGNOSIS — I10 Essential (primary) hypertension: Secondary | ICD-10-CM | POA: Diagnosis not present

## 2018-12-19 DIAGNOSIS — E782 Mixed hyperlipidemia: Secondary | ICD-10-CM

## 2018-12-19 DIAGNOSIS — E1121 Type 2 diabetes mellitus with diabetic nephropathy: Secondary | ICD-10-CM | POA: Diagnosis not present

## 2018-12-19 MED ORDER — LEVOTHYROXINE SODIUM 150 MCG PO TABS: 150.0000 ug | ORAL_TABLET | Freq: Every day | ORAL | 6 refills | Status: DC

## 2018-12-19 MED ORDER — INSULIN DETEMIR 100 UNIT/ML FLEXPEN: 30.0000 [IU] | PEN_INJECTOR | Freq: Every day | SUBCUTANEOUS | 2 refills | Status: DC

## 2018-12-19 NOTE — Progress Notes (Signed)
Endocrinology follow-up note       12/19/2018, 9:36 AM   Subjective:    Patient ID: Michael Trevino, male    DOB: 1941-09-30.  he is being seen in follow-up for management of currently uncontrolled symptomatic type 2 diabetes, hyperlipidemia, hypertension, and hypothyroidism. PMD:   Dettinger, Fransisca Kaufmann, MD.   Past Medical History:  Diagnosis Date  . Arthritis   . Diabetes mellitus without complication ([place])   . Heart attack ([place])   . Hyperlipidemia   . Hypertension   . MI (myocardial infarction) ([place]) 2000  . Renal disorder   . Sepsis ([place])   . Sepsis due to Streptococcus, group B ([place]) 02/24/2013  . Stroke ([place])   . Thyroid disease    hypothyroid   Past Surgical History:  Procedure Laterality Date  . TEE WITHOUT CARDIOVERSION N/A 02/26/2013   Procedure: TRANSESOPHAGEAL ECHOCARDIOGRAM (TEE);  Surgeon: Thayer Headings, MD;  Location: [facility] ENDOSCOPY;  Service: Cardiovascular;  Laterality: N/A;   Social History   Socioeconomic History  . Marital status: Married    Spouse name: Not on file  . Number of children: 6  . Years of education: 63  . Highest education level: 9th grade  Occupational History  . 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 Financial resource strain: Not hard at all  . Food insecurity:    Worry: Never true    Inability: Never true  . Transportation needs:    Medical: No    Non-medical: No  Tobacco Use  . Smoking status: Former Smoker    Packs/day: 0.50    Years: 30.00    Pack years: 15.00    Types: Cigarettes    Last attempt to quit: 11/05/1984    Years since quitting: 34.1  . Smokeless tobacco: Never Used  Substance and Sexual Activity  . Alcohol use: No  . Drug use: No  . Sexual activity: Yes  Lifestyle  . Physical activity:    Days per week: 3 days    Minutes per  session: 20 min  . Stress: Not at all  Relationships  . Social connections:    Talks on phone: More than three times a week    Gets together: More than three times a week    Attends religious service: More than 4 times per year    Active member of club or organization: No    Attends meetings of clubs or organizations: Never    Relationship status: Married  Other Topics Concern  . Not on file  Social History Narrative  . Not on file   Outpatient Encounter Medications as of 12/19/2018  Medication Sig  . amLODipine (NORVASC) 10 MG tablet Take 1 tablet (10 mg total) by mouth daily.  Marland Kitchen ascorbic acid (VITAMIN C) 500 MG tablet Take 500 mg by mouth daily.  Marland Kitchen aspirin EC 81 MG tablet Take 81 mg by mouth daily.  Marland Kitchen atorvastatin (LIPITOR) 40 MG tablet TAKE 1 TABLET BY MOUTH EVERY DAY  . Calcium Carbonate 500 MG CHEW Chew  1 tablet (500 mg total) by mouth 3 (three) times daily as needed. (Patient taking differently: Chew 500 mg by mouth daily. )  . furosemide (LASIX) 80 MG tablet Take 80 mg by mouth 2 (two) times daily.   Marland Kitchen glimepiride (AMARYL) 2 MG tablet TAKE 1 TABLET (2 MG TOTAL) BY MOUTH DAILY WITH BREAKFAST.  Marland Kitchen glucose blood test strip Use to check blood glucose once daily.  Dx:  E11.9  . Insulin Detemir (LEVEMIR FLEXPEN) 100 UNIT/ML Pen Inject 30 Units into the skin at bedtime.  Marland Kitchen levothyroxine (SYNTHROID, LEVOTHROID) 150 MCG tablet Take 1 tablet (150 mcg total) by mouth daily before breakfast.  . linagliptin (TRADJENTA) 5 MG TABS tablet Take 1 tablet (5 mg total) by mouth daily.  . metoprolol tartrate (LOPRESSOR) 100 MG tablet Take 1 tablet (100 mg total) by mouth 2 (two) times daily.  Marland Kitchen olmesartan (BENICAR) 40 MG tablet Take 1 tablet (40 mg total) by mouth daily.  . [DISCONTINUED] Insulin Detemir (LEVEMIR FLEXPEN) 100 UNIT/ML Pen Inject 35 Units into the skin at bedtime.  . [DISCONTINUED] Levothyroxine Sodium 175 MCG CAPS Take 175 mcg by mouth daily before breakfast.   No  facility-administered encounter medications on file as of 12/19/2018.     ALLERGIES: Allergies  Allergen Reactions  . Zocor [Simvastatin - High Dose] Nausea Only    Unknown    VACCINATION STATUS: Immunization History  Administered Date(s) Administered  . Influenza Whole 08/25/2008  . Influenza, High Dose Seasonal PF 08/30/2016, 08/16/2017, 08/19/2018  . Influenza,inj,Quad PF,6+ Mos 08/19/2014, 08/25/2015  . Pneumococcal Conjugate-13 04/06/2015  . Pneumococcal Polysaccharide-23 10/06/2005, 12/12/2007    Diabetes  He presents for his follow-up diabetic visit. He has type 2 diabetes mellitus. Onset time: He was diagnosed at approximate age of 5 years. His disease course has been fluctuating. There are no hypoglycemic associated symptoms. Pertinent negatives for hypoglycemia include no confusion, headaches, pallor or seizures. Pertinent negatives for diabetes include no blurred vision, no chest pain, no fatigue, no polydipsia, no polyphagia, no polyuria and no weakness. There are no hypoglycemic complications. Symptoms are improving. Diabetic complications include heart disease and nephropathy. Risk factors for coronary artery disease include diabetes mellitus, dyslipidemia, family history, hypertension, male sex, obesity, sedentary lifestyle and tobacco exposure. Current diabetic treatment includes insulin injections and oral agent (dual therapy). His weight is increasing steadily. He is following a generally unhealthy diet. When asked about meal planning, he reported none. He has not had a previous visit with a dietitian. He never participates in exercise. His breakfast blood glucose range is generally 130-140 mg/dl. His bedtime blood glucose range is generally 180-200 mg/dl. His overall blood glucose range is 180-200 mg/dl. An ACE inhibitor/angiotensin II receptor blocker is being taken. He does not see a podiatrist.Eye exam is current.  Hyperlipidemia  This is a chronic problem. The current  episode started more than 1 year ago. Exacerbating diseases include diabetes, hypothyroidism and obesity. Pertinent negatives include no chest pain, myalgias or shortness of breath. Current antihyperlipidemic treatment includes statins. Risk factors for coronary artery disease include diabetes mellitus, dyslipidemia, hypertension, male sex, family history, obesity and a sedentary lifestyle.  Hypertension  This is a chronic problem. The current episode started more than 1 year ago. The problem is uncontrolled. Pertinent negatives include no blurred vision, chest pain, headaches, neck pain, palpitations or shortness of breath. Risk factors for coronary artery disease include dyslipidemia, diabetes mellitus, family history, male gender, obesity, sedentary lifestyle and smoking/tobacco exposure. Past treatments include angiotensin blockers and  calcium channel blockers. Hypertensive end-organ damage includes kidney disease and CAD/MI.     Review of Systems  Constitutional: Negative for chills, fatigue, fever and unexpected weight change.  HENT: Negative for dental problem, mouth sores and trouble swallowing.   Eyes: Negative for blurred vision and visual disturbance.  Respiratory: Negative for cough, choking, chest tightness, shortness of breath and wheezing.   Cardiovascular: Negative for chest pain, palpitations and leg swelling.  Gastrointestinal: Negative for abdominal distention, abdominal pain, constipation, diarrhea, nausea and vomiting.  Endocrine: Negative for polydipsia, polyphagia and polyuria.  Genitourinary: Negative for dysuria, flank pain, hematuria and urgency.  Musculoskeletal: Negative for back pain, gait problem, myalgias and neck pain.  Skin: Negative for pallor, rash and wound.  Neurological: Negative for seizures, syncope, weakness, numbness and headaches.  Psychiatric/Behavioral: Negative for confusion and dysphoric mood.    Objective:    BP (!) 145/70   Pulse 85   Ht 5\' 8"   (1.727 m)   Wt 254 lb (115.2 kg)   BMI 38.62 kg/m   Wt Readings from Last 3 Encounters:  12/19/18 254 lb (115.2 kg)  08/28/18 251 lb (113.9 kg)  08/19/18 248 lb 9.6 oz (112.8 kg)     Physical Exam  Constitutional: He is oriented to person, place, and time. He appears well-developed. He is cooperative. No distress.  Patient is unable to read and write  HENT:  Head: Normocephalic and atraumatic.  Eyes: EOM are normal.  Neck: Normal range of motion. Neck supple. No tracheal deviation present. No thyromegaly present.  Cardiovascular: Normal rate, S1 normal, S2 normal and normal heart sounds. Exam reveals no gallop.  No murmur heard. Pulses:      Dorsalis pedis pulses are 1+ on the right side and 1+ on the left side.       Posterior tibial pulses are 1+ on the right side and 1+ on the left side.  Pulmonary/Chest: Breath sounds normal. No respiratory distress. He has no wheezes.  Abdominal: Soft. Bowel sounds are normal. He exhibits no distension. There is no abdominal tenderness. There is no guarding and no CVA tenderness.  Musculoskeletal:        General: No edema.     Right shoulder: He exhibits no swelling and no deformity.  Neurological: He is alert and oriented to person, place, and time. He has normal strength. No cranial nerve deficit or sensory deficit. Gait normal.  Patient is hard of hearing.  Skin: Skin is warm and dry. No rash noted. No cyanosis. Nails show no clubbing.  Psychiatric: He has a normal mood and affect. His speech is normal. Cognition and memory are normal.  Patient is illiterate.   CMP     Component Value Date/Time   NA 145 (H) 08/26/2018 0916   K 4.2 08/26/2018 0916   CL 109 (H) 08/26/2018 0916   CO2 23 08/26/2018 0916   GLUCOSE 156 (H) 08/26/2018 0916   GLUCOSE 161 (H) 02/27/2013 0750   BUN 21 12/13/2018   CREATININE 2.4 (A) 12/13/2018   CREATININE 2.00 (H) 08/26/2018 0916   CALCIUM 7.7 (L) 08/26/2018 0916   PROT 5.9 (L) 08/26/2018 0916   ALBUMIN  3.2 (L) 08/26/2018 0916   AST 16 08/26/2018 0916   ALT 16 08/26/2018 0916   ALKPHOS 84 08/26/2018 0916   BILITOT <0.2 08/26/2018 0916   GFRNONAA 31 (L) 08/26/2018 0916   GFRAA 36 (L) 08/26/2018 0916     Diabetic Labs (most recent): Lab Results  Component Value Date  HGBA1C 8.3 12/13/2018   HGBA1C 8.6 (H) 08/26/2018   HGBA1C 8.6 (A) 08/26/2018     Lipid Panel ( most recent) Lipid Panel     Component Value Date/Time   CHOL 151 08/26/2018 0916   CHOL 108 04/11/2013 1153   TRIG 179 (H) 08/26/2018 0916   TRIG 98 07/17/2013 0940   TRIG 131 04/11/2013 1153   HDL 29 (L) 08/26/2018 0916   HDL 31 (L) 07/17/2013 0940   HDL 33 (L) 04/11/2013 1153   CHOLHDL 5.2 (H) 08/26/2018 0916   LDLCALC 86 08/26/2018 0916   LDLCALC 49 07/17/2013 0940   LDLCALC 49 04/11/2013 1153      Lab Results  Component Value Date   TSH 0.07 (A) 12/13/2018   TSH 5.540 (H) 08/26/2018   TSH 6.390 (H) 04/10/2018   TSH 8.370 (H) 01/07/2018   TSH 8.040 (H) 08/16/2017   TSH 7.640 (H) 04/16/2017   TSH 9.510 (H) 10/20/2016   TSH 7.360 (H) 04/11/2016   TSH 8.300 (H) 01/10/2016   TSH 3.080 07/14/2015   FREET4 1.42 04/10/2018   FREET4 1.22 01/07/2018      Assessment & Plan:   1. DM type 2 causing vascular disease , Stage 3 renal insufficiency.  - Patient has currently uncontrolled symptomatic type 2 DM since  78 years of age. - Patient came with continued engagement , slightly above target postprandial glycemic profile, tight fasting blood glucose readings.  A1c of 8.3%, generally improving.    -Recent labs reviewed.  -his diabetes is complicated by coronary artery disease, stage 3 renal insufficiency, obesity/sedentary life, illiteracy  and Carmino Ocain remains at a high risk for more acute and chronic complications which include CAD, CVA, CKD, retinopathy, and neuropathy. These are all discussed in detail with the patient.  - I have counseled him on diet management and weight loss, by adopting a  carbohydrate restricted/protein rich diet. -He still admits to dietary indiscretions including consumption of sweetened beverages. - Patient admits there is a room for improvement in his diet and drink choices. -  Suggestion is made for him to avoid simple carbohydrates  from his diet including Cakes, Sweet Desserts / Pastries, Ice Cream, Soda (diet and regular), Sweet Tea, Candies, Chips, Cookies, Store Bought Juices, Alcohol in Excess of  1-2 drinks a day, Artificial Sweeteners, and "Sugar-free" Products. This will help patient to have stable blood glucose profile and potentially avoid unintended weight gain.   - I encouraged him to switch to  unprocessed or minimally processed complex starch and increased protein intake (animal or plant source), fruits, and vegetables.  - he is advised to stick to a routine mealtimes to eat 3 meals  a day and avoid unnecessary snacks ( to snack only to correct hypoglycemia).    - I have approached him with the following individualized plan to manage diabetes and patient agrees:    -Patient is illiterate, being helped by his elderly wife who can read and write.   -The #1 goal in his diabetes management will still be to avoid hypoglycemia.  -Target A1c for him will be between 8% and 8.5%.   -All attempts should be to keep treatment simple for them to follow.  -He is advised to decrease Levemir to 30 units daily at bedtime, continue strict monitoring of blood glucose 2 times daily- before breakfast and at bedtime, and as needed anytime.    - Patient is warned not to take insulin without proper monitoring per orders.  -Patient is encouraged  to call clinic for blood glucose levels less than 70 or above 300 mg /dl. -Advised to  continue Tradjenta 5 mg by mouth every morning therapeutically suitable for patient . -Patient is not a candidate for metformin and SGLT2 inhibitors due to CKD. - For lack of better choices, I continued his glimepiride at lower dose of 2  mg daily with breakfast.   - Patient specific target  A1c;  LDL, HDL, Triglycerides, and  Waist Circumference were discussed in detail.  2) BP/HTN: His blood pressure is not controlled to target.   He admits to have missed his morning blood pressure medications today.  He is advised to continue his current medications, including amlodipine 10 mg p.o. daily, Lasix 80 mg p.o. twice daily, irbesartan 300 mg p.o. daily, and metoprolol 100 mg p.o. twice daily.    3) Lipids/HPL : His most recent lipid panel showed controlled LDL at 63.   He is advised to continue atorvastatin 40 mg p.o. nightly.    4)  Weight/Diet: CDE Consult will be initiated , exercise, and detailed carbohydrates information provided.  5) hypothyroidism: Long-standing diagnosis -His thyroid function tests are consistent with over replacement with levothyroxine.  This could be mainly due to the fact that he is more consistent taking his thyroid hormone this time. -I will proceed to lower his levothyroxine to 150 mcg p.o. every morning.    - We discussed about the correct intake of his thyroid hormone, on empty stomach at fasting, with water, separated by at least 30 minutes from breakfast and other medications,  and separated by more than 4 hours from calcium, iron, multivitamins, acid reflux medications (PPIs). -Patient is made aware of the fact that thyroid hormone replacement is needed for life, dose to be adjusted by periodic monitoring of thyroid function tests.   6) Chronic Care/Health Maintenance:  -he  is on ACEI/ARB and Statin medications and  is encouraged to continue to follow up with Ophthalmology, Dentist,  Podiatrist at least yearly or according to recommendations, and advised to  stay away from smoking. I have recommended yearly flu vaccine and pneumonia vaccination at least every 5 years; and  sleep for at least 7 hours a day. - I advised patient to maintain close follow up with Dettinger, Fransisca Kaufmann, MD for primary  care needs.  - Time spent with the patient: 25 min, of which >50% was spent in reviewing his blood glucose logs , discussing his hypoglycemia and hyperglycemia episodes, reviewing his current and  previous labs / studies and medications  doses and developing a plan to avoid hypoglycemia and hyperglycemia. Please refer to Patient Instructions for Blood Glucose Monitoring and Insulin/Medications Dosing Guide"  in media tab for additional information. Michael Trevino participated in the discussions, expressed understanding, and voiced agreement with the above plans.  All questions were answered to his satisfaction. he is encouraged to contact clinic should he have any questions or concerns prior to his return visit.  Follow up plan: - Return in about 4 months (around 04/19/2019) for Meter, and Logs.   Glade Lloyd, MD [facility] [address] [place], [place] [address] Phone: [phone]  Fax: [phone]    12/19/2018, 9:36 AM  This note was partially dictated with voice recognition software. Similar sounding words can be transcribed inadequately or may not  be corrected upon review.

## 2018-12-20 ENCOUNTER — Ambulatory Visit (INDEPENDENT_AMBULATORY_CARE_PROVIDER_SITE_OTHER): Payer: Medicare Other | Admitting: Family Medicine

## 2018-12-20 ENCOUNTER — Encounter: Payer: Self-pay | Admitting: Family Medicine

## 2018-12-20 VITALS — BP 177/65 | HR 72 | Temp 97.2°F | Ht 68.0 in | Wt 252.2 lb

## 2018-12-20 DIAGNOSIS — E1159 Type 2 diabetes mellitus with other circulatory complications: Secondary | ICD-10-CM | POA: Diagnosis not present

## 2018-12-20 DIAGNOSIS — R600 Localized edema: Secondary | ICD-10-CM

## 2018-12-20 DIAGNOSIS — E039 Hypothyroidism, unspecified: Secondary | ICD-10-CM

## 2018-12-20 DIAGNOSIS — E782 Mixed hyperlipidemia: Secondary | ICD-10-CM | POA: Diagnosis not present

## 2018-12-20 DIAGNOSIS — E1122 Type 2 diabetes mellitus with diabetic chronic kidney disease: Secondary | ICD-10-CM | POA: Diagnosis not present

## 2018-12-20 DIAGNOSIS — E1165 Type 2 diabetes mellitus with hyperglycemia: Secondary | ICD-10-CM

## 2018-12-20 DIAGNOSIS — N183 Chronic kidney disease, stage 3 unspecified: Secondary | ICD-10-CM

## 2018-12-20 DIAGNOSIS — IMO0002 Reserved for concepts with insufficient information to code with codable children: Secondary | ICD-10-CM

## 2018-12-20 DIAGNOSIS — I10 Essential (primary) hypertension: Secondary | ICD-10-CM

## 2018-12-20 DIAGNOSIS — E1129 Type 2 diabetes mellitus with other diabetic kidney complication: Secondary | ICD-10-CM

## 2018-12-20 DIAGNOSIS — Z6837 Body mass index (BMI) 37.0-37.9, adult: Secondary | ICD-10-CM

## 2018-12-20 DIAGNOSIS — E66812 Obesity, class 2: Secondary | ICD-10-CM

## 2018-12-20 NOTE — Progress Notes (Addendum)
BP (!) 177/65   Pulse 72   Temp (!) 97.2 F (36.2 C) (Oral)   Ht 5\' 8"  (1.727 m)   Wt 252 lb 3.2 oz (114.4 kg)   BMI 38.35 kg/m    Subjective:    Patient ID: Michael Trevino, male    DOB: [birth year], 78 y.o.   MRN: [id number]  HPI: Michael Trevino is a 78 y.o. male presenting on 12/20/2018 for Diabetes (4 month follow up); Hyperlipidemia; and Hypertension   HPI Type 2 diabetes mellitus Patient comes in today for recheck of his diabetes. Patient has been currently taking glimepiride and Levemir, his Levemir was just lowered by his endocrinologist Dr. Jodelle Green to 30 units because of some hypoglycemic episodes.  Patient is also taking Tradjenta. Patient is currently on an ACE inhibitor/ARB. Patient has not seen an ophthalmologist this year. Patient denies any issues with their feet except swelling, he does not want to do his foot exam today because of the swelling.   Hypothyroidism recheck Patient is coming in for thyroid recheck today as well. They deny any issues with hair changes or heat or cold problems or diarrhea or constipation. They deny any chest pain or palpitations. They are currently on levothyroxine 150 micrograms  Hypertension Patient is currently on metoprolol and Benicar, and their blood pressure today is 177/65, patient has home blood pressure readings that are running in the 130s and 140s almost all of the time and sometimes even in the 517G systolic. Patient denies any lightheadedness or dizziness. Patient denies headaches, blurred vision, chest pains, shortness of breath, or weakness. Denies any side effects from medication and is content with current medication.  Hyperlipidemia Patient is coming in for recheck of his hyperlipidemia. The patient is currently taking atorvastatin. They deny any issues with myalgias or history of liver damage from it. They deny any focal numbness or weakness or chest pain.   Bilateral lower extremity edema Patient comes in complaining of  bilateral lower extremity edema that something that is fall off and on for quite many years.  Likely related to his CKD and peripheral vascular disease.  He says he tries to keep his legs elevated as much as he can but has not used any compression stockings and does not have any currently.  Relevant past medical, surgical, family and social history reviewed and updated as indicated. Interim medical history since our last visit reviewed. Allergies and medications reviewed and updated.  Review of Systems  Constitutional: Negative for chills and fever.  Eyes: Negative for visual disturbance.  Respiratory: Negative for shortness of breath and wheezing.   Cardiovascular: Negative for chest pain and leg swelling.  Musculoskeletal: Negative for back pain and gait problem.  Skin: Negative for rash.  Neurological: Negative for dizziness, weakness, light-headedness and numbness.  All other systems reviewed and are negative.   Per HPI unless specifically indicated above   Allergies as of 12/20/2018      Reactions   Zocor [simvastatin - High Dose] Nausea Only   Unknown      Medication List       Accurate as of December 20, 2018  8:20 AM. Always use your most recent med list.        amLODipine 10 MG tablet Commonly known as:  NORVASC Take 1 tablet (10 mg total) by mouth daily.   ascorbic acid 500 MG tablet Commonly known as:  VITAMIN C Take 500 mg by mouth daily.   aspirin EC 81 MG tablet Take 81 mg  by mouth daily.   atorvastatin 40 MG tablet Commonly known as:  LIPITOR TAKE 1 TABLET BY MOUTH EVERY DAY   Calcium Carbonate 500 MG Chew Chew 1 tablet (500 mg total) by mouth 3 (three) times daily as needed.   furosemide 80 MG tablet Commonly known as:  LASIX Take 80 mg by mouth 2 (two) times daily.   glimepiride 2 MG tablet Commonly known as:  AMARYL TAKE 1 TABLET (2 MG TOTAL) BY MOUTH DAILY WITH BREAKFAST.   glucose blood test strip Use to check blood glucose once daily.  Dx:   E11.9   Insulin Detemir 100 UNIT/ML Pen Commonly known as:  LEVEMIR FLEXPEN Inject 30 Units into the skin at bedtime.   levothyroxine 150 MCG tablet Commonly known as:  SYNTHROID, LEVOTHROID Take 1 tablet (150 mcg total) by mouth daily before breakfast.   linagliptin 5 MG Tabs tablet Commonly known as:  TRADJENTA Take 1 tablet (5 mg total) by mouth daily.   metoprolol tartrate 100 MG tablet Commonly known as:  LOPRESSOR Take 1 tablet (100 mg total) by mouth 2 (two) times daily.   olmesartan 40 MG tablet Commonly known as:  BENICAR Take 1 tablet (40 mg total) by mouth daily.          Objective:    BP (!) 177/65   Pulse 72   Temp (!) 97.2 F (36.2 C) (Oral)   Ht 5\' 8"  (1.727 m)   Wt 252 lb 3.2 oz (114.4 kg)   BMI 38.35 kg/m   Wt Readings from Last 3 Encounters:  12/20/18 252 lb 3.2 oz (114.4 kg)  12/19/18 254 lb (115.2 kg)  08/28/18 251 lb (113.9 kg)    Physical Exam Vitals signs and nursing note reviewed.  Constitutional:      General: He is not in acute distress.    Appearance: He is well-developed. He is not diaphoretic.  Eyes:     General: No scleral icterus.    Conjunctiva/sclera: Conjunctivae normal.  Neck:     Musculoskeletal: Neck supple.     Thyroid: No thyromegaly.  Cardiovascular:     Rate and Rhythm: Normal rate and regular rhythm.     Heart sounds: Murmur present. Crescendo  decrescendo  systolic murmur present with a grade of 3/6.  Pulmonary:     Effort: Pulmonary effort is normal. No respiratory distress.     Breath sounds: Normal breath sounds. No wheezing.  Musculoskeletal: Normal range of motion.  Lymphadenopathy:     Cervical: No cervical adenopathy.  Skin:    General: Skin is warm and dry.     Findings: No rash.  Neurological:     Mental Status: He is alert and oriented to person, place, and time.     Coordination: Coordination normal.  Psychiatric:        Behavior: Behavior normal.         Assessment & Plan:   Problem List  Items Addressed This Visit      Cardiovascular and Mediastinum   HTN (hypertension)   DM type 2 causing vascular disease ([place])     Endocrine   DM (diabetes mellitus), type 2, uncontrolled, with renal complications ([place])   Hypothyroidism   Uncontrolled type 2 diabetes mellitus with stage 3 chronic kidney disease ([place]) - Primary     Genitourinary   CKD (chronic kidney disease) stage 3, GFR 30-59 ml/min ([facility])     Other   Mixed hyperlipidemia   Class 2 severe obesity due to excess calories  with serious comorbidity and body mass index (BMI) of 37.0 to 37.9 in adult [facility])    Other Visit Diagnoses    Bilateral lower extremity edema       Relevant Orders   DME Other see comment       Follow up plan: Return in about 6 months (around 06/20/2019), or if symptoms worsen or fail to improve, for Hypertension and diabetes and thyroid recheck.  Counseling provided for all of the vaccine components No orders of the defined types were placed in this encounter.   Caryl Pina, MD [facility] 12/20/2018, 8:20 AM

## 2018-12-23 ENCOUNTER — Other Ambulatory Visit: Payer: Self-pay | Admitting: Family Medicine

## 2018-12-25 ENCOUNTER — Telehealth: Payer: Self-pay

## 2018-12-25 ENCOUNTER — Other Ambulatory Visit: Payer: Self-pay

## 2018-12-25 MED ORDER — LEVOTHYROXINE SODIUM 150 MCG PO TABS: 150.0000 ug | ORAL_TABLET | Freq: Every day | ORAL | 1 refills | Status: DC

## 2018-12-25 NOTE — Telephone Encounter (Signed)
He will be on Levothyroxine 150 mcg. I did not increase his dose.

## 2018-12-25 NOTE — Telephone Encounter (Signed)
Mr. Mcbroom is aware of the recommendation

## 2018-12-25 NOTE — Telephone Encounter (Signed)
Michael Trevino is calling to have his Levothyroxine refilled at [facility] in [place] he is unsure of what dosage he says you increased him to 175 mcg and all I see is the 150 mcg, please advise?

## 2019-01-31 ENCOUNTER — Other Ambulatory Visit: Payer: Self-pay | Admitting: Family Medicine

## 2019-03-20 ENCOUNTER — Other Ambulatory Visit: Payer: Self-pay | Admitting: Family Medicine

## 2019-04-10 ENCOUNTER — Other Ambulatory Visit: Payer: Self-pay

## 2019-04-10 ENCOUNTER — Telehealth: Payer: Self-pay | Admitting: Family Medicine

## 2019-04-11 ENCOUNTER — Ambulatory Visit (INDEPENDENT_AMBULATORY_CARE_PROVIDER_SITE_OTHER): Payer: Medicare Other | Admitting: Family Medicine

## 2019-04-11 ENCOUNTER — Encounter: Payer: Self-pay | Admitting: Family Medicine

## 2019-04-11 ENCOUNTER — Other Ambulatory Visit: Payer: Self-pay | Admitting: "Endocrinology

## 2019-04-11 ENCOUNTER — Other Ambulatory Visit: Payer: Self-pay

## 2019-04-11 VITALS — BP 174/68 | HR 77 | Temp 97.0°F | Ht 68.0 in | Wt 255.6 lb

## 2019-04-11 DIAGNOSIS — E1122 Type 2 diabetes mellitus with diabetic chronic kidney disease: Secondary | ICD-10-CM | POA: Diagnosis not present

## 2019-04-11 DIAGNOSIS — E1121 Type 2 diabetes mellitus with diabetic nephropathy: Secondary | ICD-10-CM | POA: Diagnosis not present

## 2019-04-11 DIAGNOSIS — E039 Hypothyroidism, unspecified: Secondary | ICD-10-CM | POA: Diagnosis not present

## 2019-04-11 DIAGNOSIS — Z794 Long term (current) use of insulin: Secondary | ICD-10-CM | POA: Diagnosis not present

## 2019-04-11 DIAGNOSIS — E782 Mixed hyperlipidemia: Secondary | ICD-10-CM | POA: Diagnosis not present

## 2019-04-11 DIAGNOSIS — N183 Chronic kidney disease, stage 3 unspecified: Secondary | ICD-10-CM

## 2019-04-11 DIAGNOSIS — I10 Essential (primary) hypertension: Secondary | ICD-10-CM | POA: Diagnosis not present

## 2019-04-11 DIAGNOSIS — IMO0002 Reserved for concepts with insufficient information to code with codable children: Secondary | ICD-10-CM

## 2019-04-11 DIAGNOSIS — E1165 Type 2 diabetes mellitus with hyperglycemia: Secondary | ICD-10-CM

## 2019-04-11 DIAGNOSIS — E1159 Type 2 diabetes mellitus with other circulatory complications: Secondary | ICD-10-CM

## 2019-04-11 LAB — LIPID PANEL

## 2019-04-11 LAB — BAYER DCA HB A1C WAIVED: HB A1C (BAYER DCA - WAIVED): 7.7 % — ABNORMAL HIGH (ref <7.0)

## 2019-04-11 MED ORDER — ATORVASTATIN CALCIUM 40 MG PO TABS: ORAL_TABLET | ORAL | 3 refills | Status: DC

## 2019-04-11 MED ORDER — LEVOTHYROXINE SODIUM 150 MCG PO TABS: 150.0000 ug | ORAL_TABLET | Freq: Every day | ORAL | 1 refills | Status: DC

## 2019-04-11 MED ORDER — LINAGLIPTIN 5 MG PO TABS: 5.0000 mg | ORAL_TABLET | Freq: Every day | ORAL | 0 refills | Status: DC

## 2019-04-11 MED ORDER — GLIMEPIRIDE 2 MG PO TABS: 2.0000 mg | ORAL_TABLET | Freq: Every day | ORAL | 3 refills | Status: DC

## 2019-04-11 MED ORDER — AMLODIPINE BESYLATE 10 MG PO TABS: 10.0000 mg | ORAL_TABLET | Freq: Every day | ORAL | 3 refills | Status: DC

## 2019-04-11 MED ORDER — OLMESARTAN MEDOXOMIL 40 MG PO TABS: 40.0000 mg | ORAL_TABLET | Freq: Every day | ORAL | 3 refills | Status: DC

## 2019-04-11 MED ORDER — INSULIN DETEMIR 100 UNIT/ML FLEXPEN: 30.0000 [IU] | PEN_INJECTOR | Freq: Every day | SUBCUTANEOUS | 2 refills | Status: DC

## 2019-04-11 MED ORDER — METOPROLOL TARTRATE 100 MG PO TABS: 100.0000 mg | ORAL_TABLET | Freq: Two times a day (BID) | ORAL | 3 refills | Status: DC

## 2019-04-11 MED ORDER — FUROSEMIDE 80 MG PO TABS: 80.0000 mg | ORAL_TABLET | Freq: Two times a day (BID) | ORAL | 3 refills | Status: DC

## 2019-04-11 NOTE — Progress Notes (Signed)
 BP (!) 174/68   Pulse 77   Temp (!) 97 F (36.1 C) (Oral)   Ht 5' 8" (1.727 m)   Wt 255 lb 9.6 oz (115.9 kg)   BMI 38.86 kg/m    Subjective:   Patient ID: Michael Trevino, male    DOB: 10/30/1941, 78 y.o.   MRN: [id number]  HPI: Michael Trevino is a 78 y.o. male presenting on 04/11/2019 for Hypertension (check up of chronic medical conditions) and Diabetes   HPI Type 2 diabetes mellitus Patient sees endocrinology patient comes in today for recheck of his diabetes. Patient has been currently taking Levemir 30 and Tradjenta and glimepiride, his blood sugars been running better in the morning around 98-1 01 and in the afternoon between 140 and 150 for the most part.. Patient is currently on an ACE inhibitor/ARB. Patient has not seen an ophthalmologist this year. Patient denies any issues with their feet.  Patient declined foot exam today because his shoes are too hard to get off and he will wear different shoes the next time he comes.  Patient does have CKD stage III and has been stable.  Patient also has vascular disease including CAD  Hypothyroidism recheck Patient is coming in for thyroid recheck today as well. They deny any issues with hair changes or heat or cold problems or diarrhea or constipation. They deny any chest pain or palpitations. They are currently on levothyroxine 150 micrograms   Hyperlipidemia Patient is coming in for recheck of his hyperlipidemia. The patient is currently taking Lipitor. They deny any issues with myalgias or history of liver damage from it. They deny any focal numbness or weakness or chest pain.   Hypertension Patient is currently on amlodipine and metoprolol and olmesartan, and their blood pressure today is 174/68 but at home it is running in the 130s over 60s very consistently and he has a note pad with the numbers.  We will have him bring in his blood pressure machine next time and test against ours. Patient denies any lightheadedness or dizziness. Patient  denies headaches, blurred vision, chest pains, shortness of breath, or weakness. Denies any side effects from medication and is content with current medication.   Relevant past medical, surgical, family and social history reviewed and updated as indicated. Interim medical history since our last visit reviewed. Allergies and medications reviewed and updated.  Review of Systems  Constitutional: Negative for chills and fever.  Eyes: Negative for visual disturbance.  Respiratory: Negative for shortness of breath and wheezing.   Cardiovascular: Negative for chest pain and leg swelling.  Musculoskeletal: Negative for back pain and gait problem.  Skin: Negative for rash.  Neurological: Negative for dizziness and weakness.  All other systems reviewed and are negative.   Per HPI unless specifically indicated above   Allergies as of 04/11/2019      Reactions   Zocor [simvastatin - High Dose] Nausea Only   Unknown      Medication List       Accurate as of Apr 11, 2019  8:30 AM. If you have any questions, ask your nurse or doctor.        amLODipine 10 MG tablet Commonly known as:  NORVASC Take 1 tablet (10 mg total) by mouth daily.   ascorbic acid 500 MG tablet Commonly known as:  VITAMIN C Take 500 mg by mouth daily.   aspirin EC 81 MG tablet Take 81 mg by mouth daily.   atorvastatin 40 MG tablet Commonly known as:    LIPITOR TAKE 1 TABLET BY MOUTH EVERY DAY   Calcium Carbonate 500 MG Chew Chew 1 tablet (500 mg total) by mouth 3 (three) times daily as needed. What changed:  when to take this   furosemide 80 MG tablet Commonly known as:  LASIX Take 1 tablet (80 mg total) by mouth 2 (two) times daily.   glimepiride 2 MG tablet Commonly known as:  AMARYL Take 1 tablet (2 mg total) by mouth daily with breakfast.   glucose blood test strip Use to check blood glucose once daily.  Dx:  E11.9   Insulin Detemir 100 UNIT/ML Pen Commonly known as:  Levemir FlexPen Inject 30  Units into the skin at bedtime.   levothyroxine 150 MCG tablet Commonly known as:  SYNTHROID Take 1 tablet (150 mcg total) by mouth daily before breakfast.   linagliptin 5 MG Tabs tablet Commonly known as:  Tradjenta Take 1 tablet (5 mg total) by mouth daily.   metoprolol tartrate 100 MG tablet Commonly known as:  LOPRESSOR Take 1 tablet (100 mg total) by mouth 2 (two) times daily.   olmesartan 40 MG tablet Commonly known as:  BENICAR Take 1 tablet (40 mg total) by mouth daily.        Objective:   BP (!) 174/68   Pulse 77   Temp (!) 97 F (36.1 C) (Oral)   Ht 5' 8" (1.727 m)   Wt 255 lb 9.6 oz (115.9 kg)   BMI 38.86 kg/m   Wt Readings from Last 3 Encounters:  04/11/19 255 lb 9.6 oz (115.9 kg)  12/20/18 252 lb 3.2 oz (114.4 kg)  12/19/18 254 lb (115.2 kg)    Physical Exam Vitals signs and nursing note reviewed.  Constitutional:      General: He is not in acute distress.    Appearance: He is well-developed. He is not diaphoretic.  Eyes:     General: No scleral icterus.    Conjunctiva/sclera: Conjunctivae normal.  Neck:     Musculoskeletal: Neck supple.     Thyroid: No thyromegaly.  Cardiovascular:     Rate and Rhythm: Normal rate and regular rhythm.     Heart sounds: Normal heart sounds. No murmur.  Pulmonary:     Effort: Pulmonary effort is normal. No respiratory distress.     Breath sounds: Normal breath sounds. No wheezing.  Musculoskeletal: Normal range of motion.  Lymphadenopathy:     Cervical: No cervical adenopathy.  Skin:    General: Skin is warm and dry.     Findings: No rash.  Neurological:     Mental Status: He is alert and oriented to person, place, and time.     Coordination: Coordination normal.  Psychiatric:        Behavior: Behavior normal.       Assessment & Plan:   Problem List Items Addressed This Visit      Cardiovascular and Mediastinum   HTN (hypertension)   Relevant Medications   atorvastatin (LIPITOR) 40 MG tablet    amLODipine (NORVASC) 10 MG tablet   furosemide (LASIX) 80 MG tablet   metoprolol tartrate (LOPRESSOR) 100 MG tablet   olmesartan (BENICAR) 40 MG tablet   Other Relevant Orders   CBC with Differential/Platelet   DM type 2 causing vascular disease (HCC)   Relevant Medications   atorvastatin (LIPITOR) 40 MG tablet   amLODipine (NORVASC) 10 MG tablet   Insulin Detemir (LEVEMIR FLEXPEN) 100 UNIT/ML Pen   furosemide (LASIX) 80 MG tablet   glimepiride (AMARYL)   2 MG tablet   linagliptin (TRADJENTA) 5 MG TABS tablet   metoprolol tartrate (LOPRESSOR) 100 MG tablet   olmesartan (BENICAR) 40 MG tablet   Other Relevant Orders   CBC with Differential/Platelet   CMP14+EGFR   Bayer DCA Hb A1c Waived     Endocrine   Hypothyroidism   Relevant Medications   levothyroxine (SYNTHROID) 150 MCG tablet   metoprolol tartrate (LOPRESSOR) 100 MG tablet   Other Relevant Orders   CBC with Differential/Platelet   TSH   Uncontrolled type 2 diabetes mellitus with stage 3 chronic kidney disease (HCC) - Primary   Relevant Medications   atorvastatin (LIPITOR) 40 MG tablet   Insulin Detemir (LEVEMIR FLEXPEN) 100 UNIT/ML Pen   glimepiride (AMARYL) 2 MG tablet   linagliptin (TRADJENTA) 5 MG TABS tablet   olmesartan (BENICAR) 40 MG tablet   Other Relevant Orders   CBC with Differential/Platelet   CMP14+EGFR   Bayer DCA Hb A1c Waived     Genitourinary   CKD (chronic kidney disease) stage 3, GFR 30-59 ml/min (HCC)   Relevant Orders   CBC with Differential/Platelet   CMP14+EGFR     Other   Mixed hyperlipidemia   Relevant Medications   atorvastatin (LIPITOR) 40 MG tablet   amLODipine (NORVASC) 10 MG tablet   furosemide (LASIX) 80 MG tablet   metoprolol tartrate (LOPRESSOR) 100 MG tablet   olmesartan (BENICAR) 40 MG tablet   Other Relevant Orders   CBC with Differential/Platelet   Lipid panel    Other Visit Diagnoses    Type 2 diabetes mellitus with diabetic nephropathy, with long-term current use of  insulin (HCC)       Relevant Medications   atorvastatin (LIPITOR) 40 MG tablet   Insulin Detemir (LEVEMIR FLEXPEN) 100 UNIT/ML Pen   glimepiride (AMARYL) 2 MG tablet   linagliptin (TRADJENTA) 5 MG TABS tablet   olmesartan (BENICAR) 40 MG tablet   Other Relevant Orders   CBC with Differential/Platelet   CMP14+EGFR   Bayer DCA Hb A1c Waived      CC lab results to Dr. Nida his endocrinologist  Patient's home blood pressures look a lot better than the ones here and running in the 130s over 60s very consistently, he will bring his machine next time to tested against ours  Patient's blood sugars are running in the 90s in the a.m. and in the 140s to 150s in the evening.  Continue current medications  Follow up plan: Return in about 3 months (around 07/12/2019), or if symptoms worsen or fail to improve, for Diabetes and hypertension and cholesterol.  Counseling provided for all of the vaccine components Orders Placed This Encounter  Procedures  . CBC with Differential/Platelet  . CMP14+EGFR  . Lipid panel  . Bayer DCA Hb A1c Waived  . TSH     , MD [facility] 04/11/2019, 8:30 AM     

## 2019-04-12 LAB — CBC WITH DIFFERENTIAL/PLATELET
Basophils Absolute: 0 10*3/uL (ref 0.0–0.2)
Basos: 0 %
EOS (ABSOLUTE): 0.1 10*3/uL (ref 0.0–0.4)
Eos: 1 %
Hematocrit: 34.4 % — ABNORMAL LOW (ref 37.5–51.0)
Hemoglobin: 11.5 g/dL — ABNORMAL LOW (ref 13.0–17.7)
Immature Grans (Abs): 0 10*3/uL (ref 0.0–0.1)
Immature Granulocytes: 0 %
Lymphocytes Absolute: 2.4 10*3/uL (ref 0.7–3.1)
Lymphs: 43 %
MCH: 28 pg (ref 26.6–33.0)
MCHC: 33.4 g/dL (ref 31.5–35.7)
MCV: 84 fL (ref 79–97)
Monocytes Absolute: 0.5 10*3/uL (ref 0.1–0.9)
Monocytes: 8 %
Neutrophils Absolute: 2.6 10*3/uL (ref 1.4–7.0)
Neutrophils: 48 %
Platelets: 194 10*3/uL (ref 150–450)
RBC: 4.11 x10E6/uL — ABNORMAL LOW (ref 4.14–5.80)
RDW: 14.7 % (ref 11.6–15.4)
WBC: 5.6 10*3/uL (ref 3.4–10.8)

## 2019-04-12 LAB — CMP14+EGFR
ALT: 16 IU/L (ref 0–44)
AST: 17 IU/L (ref 0–40)
Albumin/Globulin Ratio: 1.3 (ref 1.2–2.2)
Albumin: 3.2 g/dL — ABNORMAL LOW (ref 3.7–4.7)
Alkaline Phosphatase: 92 IU/L (ref 39–117)
BUN/Creatinine Ratio: 10 (ref 10–24)
BUN: 24 mg/dL (ref 8–27)
Bilirubin Total: <0.2 mg/dL (ref 0.0–1.2)
CO2: 18 mmol/L — ABNORMAL LOW (ref 20–29)
Calcium: 7.5 mg/dL — ABNORMAL LOW (ref 8.6–10.2)
Chloride: 109 mmol/L — ABNORMAL HIGH (ref 96–106)
Creatinine, Ser: 2.52 mg/dL — ABNORMAL HIGH (ref 0.76–1.27)
GFR calc Af Amer: 27 mL/min/{1.73_m2} — ABNORMAL LOW (ref >59)
GFR calc non Af Amer: 23 mL/min/{1.73_m2} — ABNORMAL LOW (ref >59)
Globulin, Total: 2.5 g/dL (ref 1.5–4.5)
Glucose: 93 mg/dL (ref 65–99)
Potassium: 4.5 mmol/L (ref 3.5–5.2)
Sodium: 143 mmol/L (ref 134–144)
Total Protein: 5.7 g/dL — ABNORMAL LOW (ref 6.0–8.5)

## 2019-04-12 LAB — LIPID PANEL
Chol/HDL Ratio: 5.8 ratio — ABNORMAL HIGH (ref 0.0–5.0)
Cholesterol, Total: 163 mg/dL (ref 100–199)
HDL: 28 mg/dL — ABNORMAL LOW (ref >39)
LDL Calculated: 104 mg/dL — ABNORMAL HIGH (ref 0–99)
Triglycerides: 155 mg/dL — ABNORMAL HIGH (ref 0–149)
VLDL Cholesterol Cal: 31 mg/dL (ref 5–40)

## 2019-04-12 LAB — TSH: TSH: 0.459 u[IU]/mL (ref 0.450–4.500)

## 2019-04-16 ENCOUNTER — Encounter: Payer: Self-pay | Admitting: "Endocrinology

## 2019-04-16 ENCOUNTER — Ambulatory Visit (INDEPENDENT_AMBULATORY_CARE_PROVIDER_SITE_OTHER): Payer: Medicare Other | Admitting: "Endocrinology

## 2019-04-16 ENCOUNTER — Other Ambulatory Visit: Payer: Self-pay

## 2019-04-16 DIAGNOSIS — E782 Mixed hyperlipidemia: Secondary | ICD-10-CM | POA: Diagnosis not present

## 2019-04-16 DIAGNOSIS — I10 Essential (primary) hypertension: Secondary | ICD-10-CM | POA: Diagnosis not present

## 2019-04-16 DIAGNOSIS — IMO0002 Reserved for concepts with insufficient information to code with codable children: Secondary | ICD-10-CM

## 2019-04-16 DIAGNOSIS — E1165 Type 2 diabetes mellitus with hyperglycemia: Secondary | ICD-10-CM

## 2019-04-16 DIAGNOSIS — N183 Chronic kidney disease, stage 3 unspecified: Secondary | ICD-10-CM

## 2019-04-16 DIAGNOSIS — E1122 Type 2 diabetes mellitus with diabetic chronic kidney disease: Secondary | ICD-10-CM | POA: Diagnosis not present

## 2019-04-16 DIAGNOSIS — E039 Hypothyroidism, unspecified: Secondary | ICD-10-CM

## 2019-04-16 NOTE — Progress Notes (Addendum)
04/16/2019, 5:17 PM                                                    Endocrinology Telehealth Visit Follow up Note -During COVID -19 Pandemic  This visit type was conducted due to national recommendations for restrictions regarding the COVID-19 Pandemic  in an effort to limit this patient's exposure and mitigate transmission of the corona virus.  Due to his co-morbid illnesses, Michael Trevino is at  moderate to high risk for complications without adequate follow up.  This format is felt to be most appropriate for him at this time.  I connected with this patient on 04/24/2019   by telephone and verified that I am speaking with the correct person using two identifiers. Michael Trevino, 1941/04/28. he has verbally consented to this visit. All issues noted in this document were discussed and addressed. The format was not optimal for physical exam.    Subjective:    Patient ID: Michael Trevino, male    DOB: [birth year].  he is being seen in follow-up for management of currently uncontrolled symptomatic type 2 diabetes, hyperlipidemia, hypertension, and hypothyroidism. PMD:   Dettinger, Fransisca Kaufmann, MD.   Past Medical History:  Diagnosis Date  . Arthritis   . Diabetes mellitus without complication ([place])   . Heart attack ([place])   . Hyperlipidemia   . Hypertension   . MI (myocardial infarction) ([place]) 2000  . Renal disorder   . Sepsis ([place])   . Sepsis due to Streptococcus, group B ([place]) 02/24/2013  . Stroke ([place])   . Thyroid disease    hypothyroid   Past Surgical History:  Procedure Laterality Date  . TEE WITHOUT CARDIOVERSION N/A 02/26/2013   Procedure: TRANSESOPHAGEAL ECHOCARDIOGRAM (TEE);  Surgeon: Thayer Headings, MD;  Location: [facility] ENDOSCOPY;  Service: Cardiovascular;  Laterality: N/A;   Social History   Socioeconomic History  . Marital status: Married    Spouse name: Not on file  . Number of children: 6  . Years of  education: 49  . Highest education level: 9th grade  Occupational History  . 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 Financial resource strain: Not hard at all  . Food insecurity:    Worry: Never true    Inability: Never true  . Transportation needs:    Medical: No    Non-medical: No  Tobacco Use  . Smoking status: Former Smoker    Packs/day: 0.50    Years: 30.00    Pack years: 15.00    Types: Cigarettes    Last attempt to quit: 11/05/1984    Years since quitting: 34.4  . Smokeless tobacco: Never Used  Substance and Sexual Activity  . Alcohol use: No  . Drug use: No  . Sexual activity: Yes  Lifestyle  . Physical activity:    Days per week: 3 days  Minutes per session: 20 min  . Stress: Not at all  Relationships  . Social connections:    Talks on phone: More than three times a week    Gets together: More than three times a week    Attends religious service: More than 4 times per year    Active member of club or organization: No    Attends meetings of clubs or organizations: Never    Relationship status: Married  Other Topics Concern  . Not on file  Social History Narrative  . Not on file   Outpatient Encounter Medications as of 04/16/2019  Medication Sig  . amLODipine (NORVASC) 10 MG tablet Take 1 tablet (10 mg total) by mouth daily.  Marland Kitchen ascorbic acid (VITAMIN C) 500 MG tablet Take 500 mg by mouth daily.  Marland Kitchen aspirin EC 81 MG tablet Take 81 mg by mouth daily.  Marland Kitchen atorvastatin (LIPITOR) 40 MG tablet TAKE 1 TABLET BY MOUTH EVERY DAY  . Calcium Carbonate 500 MG CHEW Chew 1 tablet (500 mg total) by mouth 3 (three) times daily as needed. (Patient taking differently: Chew 500 mg by mouth daily. )  . furosemide (LASIX) 80 MG tablet Take 1 tablet (80 mg total) by mouth 2 (two) times daily.  Marland Kitchen glimepiride (AMARYL) 2 MG tablet TAKE 1 TABLET (2 MG TOTAL) BY MOUTH DAILY WITH  BREAKFAST.  Marland Kitchen glucose blood test strip Use to check blood glucose once daily.  Dx:  E11.9  . Insulin Detemir (LEVEMIR FLEXPEN) 100 UNIT/ML Pen Inject 30 Units into the skin at bedtime.  Marland Kitchen levothyroxine (SYNTHROID) 150 MCG tablet Take 1 tablet (150 mcg total) by mouth daily before breakfast.  . linagliptin (TRADJENTA) 5 MG TABS tablet Take 1 tablet (5 mg total) by mouth daily.  . metoprolol tartrate (LOPRESSOR) 100 MG tablet Take 1 tablet (100 mg total) by mouth 2 (two) times daily.  Marland Kitchen olmesartan (BENICAR) 40 MG tablet Take 1 tablet (40 mg total) by mouth daily.  . [DISCONTINUED] glimepiride (AMARYL) 2 MG tablet Take 1 tablet (2 mg total) by mouth daily with breakfast.   No facility-administered encounter medications on file as of 04/16/2019.     ALLERGIES: Allergies  Allergen Reactions  . Zocor [Simvastatin - High Dose] Nausea Only    Unknown    VACCINATION STATUS: Immunization History  Administered Date(s) Administered  . Influenza Whole 08/25/2008  . Influenza, High Dose Seasonal PF 08/30/2016, 08/16/2017, 08/19/2018  . Influenza,inj,Quad PF,6+ Mos 08/19/2014, 08/25/2015  . Pneumococcal Conjugate-13 04/06/2015  . Pneumococcal Polysaccharide-23 10/06/2005, 12/12/2007    Diabetes  He presents for his follow-up diabetic visit. He has type 2 diabetes mellitus. Onset time: He was diagnosed at approximate age of 60 years. His disease course has been improving. There are no hypoglycemic associated symptoms. Pertinent negatives for hypoglycemia include no confusion, headaches, pallor or seizures. Pertinent negatives for diabetes include no blurred vision, no chest pain, no fatigue, no polydipsia, no polyphagia, no polyuria and no weakness. There are no hypoglycemic complications. Symptoms are improving. Diabetic complications include heart disease and nephropathy. Risk factors for coronary artery disease include diabetes mellitus, dyslipidemia, family history, hypertension, male sex, obesity,  sedentary lifestyle and tobacco exposure. Current diabetic treatment includes insulin injections and oral agent (dual therapy). His weight is increasing steadily. He is following a generally unhealthy diet. When asked about meal planning, he reported none. He has not had a previous visit with a dietitian. He never participates in exercise. His breakfast blood glucose range is generally 130-140  mg/dl. His bedtime blood glucose range is generally 180-200 mg/dl. His overall blood glucose range is 180-200 mg/dl. An ACE inhibitor/angiotensin II receptor blocker is being taken. He does not see a podiatrist.Eye exam is current.  Hyperlipidemia  This is a chronic problem. The current episode started more than 1 year ago. Exacerbating diseases include diabetes, hypothyroidism and obesity. Pertinent negatives include no chest pain, myalgias or shortness of breath. Current antihyperlipidemic treatment includes statins. Risk factors for coronary artery disease include diabetes mellitus, dyslipidemia, hypertension, male sex, family history, obesity and a sedentary lifestyle.  Hypertension  This is a chronic problem. The current episode started more than 1 year ago. The problem is uncontrolled. Pertinent negatives include no blurred vision, chest pain, headaches, neck pain, palpitations or shortness of breath. Risk factors for coronary artery disease include dyslipidemia, diabetes mellitus, family history, male gender, obesity, sedentary lifestyle and smoking/tobacco exposure. Past treatments include angiotensin blockers and calcium channel blockers. Hypertensive end-organ damage includes kidney disease and CAD/MI.     Review of Systems  Constitutional: Negative for chills, fatigue, fever and unexpected weight change.  HENT: Negative for dental problem, mouth sores and trouble swallowing.   Eyes: Negative for blurred vision and visual disturbance.  Respiratory: Negative for cough, choking, chest tightness, shortness  of breath and wheezing.   Cardiovascular: Negative for chest pain, palpitations and leg swelling.  Gastrointestinal: Negative for abdominal distention, abdominal pain, constipation, diarrhea, nausea and vomiting.  Endocrine: Negative for polydipsia, polyphagia and polyuria.  Genitourinary: Negative for dysuria, flank pain, hematuria and urgency.  Musculoskeletal: Negative for back pain, gait problem, myalgias and neck pain.  Skin: Negative for pallor, rash and wound.  Neurological: Negative for seizures, syncope, weakness, numbness and headaches.  Psychiatric/Behavioral: Negative for confusion and dysphoric mood.    Objective:    There were no vitals taken for this visit.  Wt Readings from Last 3 Encounters:  04/11/19 255 lb 9.6 oz (115.9 kg)  12/20/18 252 lb 3.2 oz (114.4 kg)  12/19/18 254 lb (115.2 kg)     CMP     Component Value Date/Time   NA 143 04/11/2019 0835   K 4.5 04/11/2019 0835   CL 109 (H) 04/11/2019 0835   CO2 18 (L) 04/11/2019 0835   GLUCOSE 93 04/11/2019 0835   GLUCOSE 161 (H) 02/27/2013 0750   BUN 24 04/11/2019 0835   CREATININE 2.52 (H) 04/11/2019 0835   CALCIUM 7.5 (L) 04/11/2019 0835   PROT 5.7 (L) 04/11/2019 0835   ALBUMIN 3.2 (L) 04/11/2019 0835   AST 17 04/11/2019 0835   ALT 16 04/11/2019 0835   ALKPHOS 92 04/11/2019 0835   BILITOT <0.2 04/11/2019 0835   GFRNONAA 23 (L) 04/11/2019 0835   GFRAA 27 (L) 04/11/2019 0835     Diabetic Labs (most recent): Lab Results  Component Value Date   HGBA1C 7.7 (H) 04/11/2019   HGBA1C 8.3 12/13/2018   HGBA1C 8.6 (H) 08/26/2018     Lipid Panel ( most recent) Lipid Panel     Component Value Date/Time   CHOL 163 04/11/2019 0835   CHOL 108 04/11/2013 1153   TRIG 155 (H) 04/11/2019 0835   TRIG 98 07/17/2013 0940   TRIG 131 04/11/2013 1153   HDL 28 (L) 04/11/2019 0835   HDL 31 (L) 07/17/2013 0940   HDL 33 (L) 04/11/2013 1153   CHOLHDL 5.8 (H) 04/11/2019 0835   LDLCALC 104 (H) 04/11/2019 0835   LDLCALC  49 07/17/2013 0940   LDLCALC 49 04/11/2013 1153  Lab Results  Component Value Date   TSH 0.459 04/11/2019   TSH 0.07 (A) 12/13/2018   TSH 5.540 (H) 08/26/2018   TSH 6.390 (H) 04/10/2018   TSH 8.370 (H) 01/07/2018   TSH 8.040 (H) 08/16/2017   TSH 7.640 (H) 04/16/2017   TSH 9.510 (H) 10/20/2016   TSH 7.360 (H) 04/11/2016   TSH 8.300 (H) 01/10/2016   FREET4 1.42 04/10/2018   FREET4 1.22 01/07/2018      Assessment & Plan:   1. DM type 2 causing vascular disease , Stage 3 renal insufficiency.  - Patient has currently uncontrolled symptomatic type 2 DM since  78 years of age. - Patient reports near target glycemic profile with no major hypoglycemia nor hyperglycemia.   -Previsit labs show A1c of 7.7% improving from 8.3%.  -Recent labs reviewed.  -his diabetes is complicated by coronary artery disease, stage 3 renal insufficiency, obesity/sedentary life, illiteracy  and [name] remains at a high risk for more acute and chronic complications which include CAD, CVA, CKD, retinopathy, and neuropathy. These are all discussed in detail with the patient.  - I have counseled him on diet management and weight loss, by adopting a carbohydrate restricted/protein rich diet. -He still admits to dietary indiscretions including consumption of sweetened beverages.  - Patient admits there is a room for improvement in his diet and drink choices. -  Suggestion is made for him to avoid simple carbohydrates  from his diet including Cakes, Sweet Desserts / Pastries, Ice Cream, Soda (diet and regular), Sweet Tea, Candies, Chips, Cookies, Store Bought Juices, Alcohol in Excess of  1-2 drinks a day, Artificial Sweeteners, and "Sugar-free" Products. This will help patient to have stable blood glucose profile and potentially avoid unintended weight gain.   - I encouraged him to switch to  unprocessed or minimally processed complex starch and increased protein intake (animal or plant source), fruits,  and vegetables.  - he is advised to stick to a routine mealtimes to eat 3 meals  a day and avoid unnecessary snacks ( to snack only to correct hypoglycemia).    - I have approached him with the following individualized plan to manage diabetes and patient agrees:    -Patient is illiterate, being helped by his elderly wife who can read and write.   -The #1 goal in his diabetes management will still be to avoid hypoglycemia.  -Target A1c for him will be between 8% and 8.5%.   -All attempts should be to keep treatment simple for them to follow.  -He is advised to Levemir 30 units  daily at bedtime, continue strict monitoring of blood glucose 2 times daily- before breakfast and at bedtime, and as needed anytime.   -Patient is encouraged to call clinic for blood glucose levels less than 70 or above 300 mg /dl. -Advised to  continue Tradjenta 5 mg by mouth every morning therapeutically suitable for patient . - Due to CKD, he is not a candidate for metformin nor SGLT2 inhibitors.   -  I continued his glimepiride at lower dose of 2 mg daily with breakfast.   - Patient specific target  A1c;  LDL, HDL, Triglycerides, and  Waist Circumference were discussed in detail.  2) BP/HTN: he is advised to home monitor blood pressure and report if > 140/90 on 2 separate readings.  He is advised to continue his current medications, including amlodipine 10 mg p.o. daily, Lasix 80 mg p.o. twice daily, irbesartan 300 mg p.o. daily, and metoprolol 100 mg  p.o. twice daily.    3) Lipids/HPL : His most recent lipid panel showed controlled LDL at 63.   He is advised to continue atorvastatin 40 mg p.o. nightly.    4)  Weight/Diet: CDE Consult will be initiated , exercise, and detailed carbohydrates information provided.  5) hypothyroidism: Long-standing diagnosis -His function tests are significantly better.  He is advised to continue  levothyroxine to 150 mcg p.o. every morning.    - We discussed about the correct  intake of his thyroid hormone, on empty stomach at fasting, with water, separated by at least 30 minutes from breakfast and other medications,  and separated by more than 4 hours from calcium, iron, multivitamins, acid reflux medications (PPIs). -Patient is made aware of the fact that thyroid hormone replacement is needed for life, dose to be adjusted by periodic monitoring of thyroid function tests.   6) Chronic Care/Health Maintenance:  -he  is on ACEI/ARB and Statin medications and  is encouraged to continue to follow up with Ophthalmology, Dentist,  Podiatrist at least yearly or according to recommendations, and advised to  stay away from smoking. I have recommended yearly flu vaccine and pneumonia vaccination at least every 5 years; and  sleep for at least 7 hours a day. - I advised patient to maintain close follow up with Dettinger, Fransisca Kaufmann, MD for primary care needs.  - Patient Care Time Today:  25 min, of which >50% was spent in reviewing his  current and  previous labs/studies, previous treatments, and medications doses and developing a plan for long-term care based on the latest recommendations for standards of care.  Michael Trevino participated in the discussions, expressed understanding, and voiced agreement with the above plans.  All questions were answered to his satisfaction. he is encouraged to contact clinic should he have any questions or concerns prior to his return visit.   Follow up plan: - Return in about 4 months (around 08/16/2019) for Follow up with Pre-visit Labs, Meter, and Logs.   Glade Lloyd, MD [facility] [address] [place], [place] [address] Phone: [phone]  Fax: [phone]    04/16/2019, 5:17 PM  This note was partially dictated with voice recognition software. Similar sounding words can be transcribed inadequately or may not  be corrected upon review.

## 2019-04-21 ENCOUNTER — Ambulatory Visit: Payer: Medicare Other | Admitting: "Endocrinology

## 2019-05-01 ENCOUNTER — Other Ambulatory Visit: Payer: Self-pay

## 2019-05-01 NOTE — Patient Outreach (Signed)
[facility]) Care Management  05/01/2019  Aashrith Eves 1941/01/07 [id number]   Medication Adherence call to Mr. Masiah Woody left a message with patient wife for patient to call back patient is due on Glimeperide 2 mg under [place].   [place] Management Direct Dial [phone]  Fax [phone] [email or web address]

## 2019-05-28 ENCOUNTER — Other Ambulatory Visit: Payer: Self-pay

## 2019-05-28 NOTE — Patient Outreach (Signed)
[facility]) Care Management  05/28/2019  Hady Niemczyk 04-24-1941 [id number]   Medication Adherence call to Mr. Gordon Vandunk [facility] Ident[facility] Verify spoke with patient he is past due on Glimepiride 2 mg patient explain he is taking 1 tablet daily and never missed taking it patient has plenty at this time. Mr. Bernette Mayers is showing past due under [place].   [place] Management Direct Dial [phone]  Fax [phone] [email or web address]

## 2019-06-24 ENCOUNTER — Ambulatory Visit (INDEPENDENT_AMBULATORY_CARE_PROVIDER_SITE_OTHER): Payer: Medicare Other | Admitting: *Deleted

## 2019-06-24 VITALS — Ht 68.0 in | Wt 255.5 lb

## 2019-06-24 DIAGNOSIS — Z0000 Encounter for general adult medical examination without abnormal findings: Secondary | ICD-10-CM

## 2019-06-24 NOTE — Progress Notes (Addendum)
MEDICARE ANNUAL WELLNESS VISIT  06/24/2019  Telephone Visit Disclaimer This Medicare AWV was conducted by telephone due to national recommendations for restrictions regarding the COVID-19 Pandemic (e.g. social distancing).  I verified, using two identifiers, that I am speaking with Michael Trevino or their authorized healthcare agent. I discussed the limitations, risks, security, and privacy concerns of performing an evaluation and management service by telephone and the potential availability of an in-person appointment in the future. The patient expressed understanding and agreed to proceed.   Subjective:  Michael Trevino is a 78 y.o. male patient of Dettinger, Fransisca Kaufmann, MD who had a Medicare Annual Wellness Visit today via telephone. Jayc is Retired and lives with their spouse. he has 6 children. he reports that he is socially active and does interact with friends/family regularly. he is not physically active and enjoys music.  Patient Care Team: Dettinger, Fransisca Kaufmann, MD as PCP - General (Family Medicine) Melina Schools, OD as Consulting Physician (Optometry) Fleet Contras, MD as Consulting Physician (Nephrology) Cassandria Anger, MD as Consulting Physician (Endocrinology)  Advanced Directives 06/24/2019 06/13/2018 04/27/2016 04/16/2015 02/25/2013 11/05/2012  Does Patient Have a Medical Advance Directive? No No No No Patient does not have advance directive Patient does not have advance directive;Patient would like information  Would patient like information on creating a medical advance directive? Yes (MAU/Ambulatory/Procedural Areas - Information given) No - Patient declined Yes - Educational materials given Yes - Scientist, clinical (histocompatibility and immunogenetics) given - Referral made to social work  Pre-existing out of facility DNR order (yellow form or pink MOST form) - - - - No No    Hospital Utilization Over the Past 12 Months: # of hospitalizations or ER visits: 0 # of surgeries: 0  Review of Systems     Patient reports that his overall health is unchanged compared to last year.  Patient Reported Readings (BP, Pulse, CBG, Weight, etc) CBG 100  Review of Systems: History obtained from chart review and the patient General ROS: negative  All other systems negative.  Pain Assessment Pain : No/denies pain     Current Medications & Allergies (verified) Allergies as of 06/24/2019      Reactions   Zocor [simvastatin - High Dose] Nausea Only   Unknown      Medication List       Accurate as of June 24, 2019  3:07 PM. If you have any questions, ask your nurse or doctor.        amLODipine 10 MG tablet Commonly known as: NORVASC Take 1 tablet (10 mg total) by mouth daily.   ascorbic acid 500 MG tablet Commonly known as: VITAMIN C Take 500 mg by mouth daily.   aspirin EC 81 MG tablet Take 81 mg by mouth daily.   atorvastatin 40 MG tablet Commonly known as: LIPITOR TAKE 1 TABLET BY MOUTH EVERY DAY   Calcium Carbonate 500 MG Chew Chew 1 tablet (500 mg total) by mouth 3 (three) times daily as needed. What changed: when to take this   furosemide 80 MG tablet Commonly known as: LASIX Take 1 tablet (80 mg total) by mouth 2 (two) times daily.   glimepiride 2 MG tablet Commonly known as: AMARYL TAKE 1 TABLET (2 MG TOTAL) BY MOUTH DAILY WITH BREAKFAST.   glucose blood test strip Use to check blood glucose once daily.  Dx:  E11.9   Insulin Detemir 100 UNIT/ML Pen Commonly known as: Levemir FlexPen Inject 30 Units into the skin at bedtime.  levothyroxine 150 MCG tablet Commonly known as: SYNTHROID Take 1 tablet (150 mcg total) by mouth daily before breakfast.   linagliptin 5 MG Tabs tablet Commonly known as: Tradjenta Take 1 tablet (5 mg total) by mouth daily.   metoprolol tartrate 100 MG tablet Commonly known as: LOPRESSOR Take 1 tablet (100 mg total) by mouth 2 (two) times daily.   olmesartan 40 MG tablet Commonly known as: BENICAR Take 1 tablet (40 mg total)  by mouth daily.       History (reviewed): Past Medical History:  Diagnosis Date  . Arthritis   . Diabetes mellitus without complication ([place])   . Heart attack ([place])   . Hyperlipidemia   . Hypertension   . MI (myocardial infarction) ([place]) 2000  . Renal disorder   . Sepsis ([place])   . Sepsis due to Streptococcus, group B ([place]) 02/24/2013  . Stroke ([place])   . Thyroid disease    hypothyroid   Past Surgical History:  Procedure Laterality Date  . TEE WITHOUT CARDIOVERSION N/A 02/26/2013   Procedure: TRANSESOPHAGEAL ECHOCARDIOGRAM (TEE);  Surgeon: Thayer Headings, MD;  [facility] ENDOSCOPY;  Service: Cardiovascular;  Laterality: N/A;   Family History  Problem Relation Age of Onset  . Diabetes Mother   . Hypertension Mother   . Cancer Father   . Stroke Brother   . Alcohol abuse Brother   . Diabetes Brother   . Diabetes Sister   . Diabetes Brother 13       TYPE 1  . Kidney disease Brother 73       DIALYSIS  . Heart attack Brother   . Healthy Daughter   . Healthy Son   . Healthy Daughter   . Healthy Son   . Healthy Son   . Healthy Son    Social History   Socioeconomic History  . Marital status: Married    Spouse name: Not on file  . Number of children: 6  . Years of education: 25  . Highest education level: 9th grade  Occupational History  . 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 Financial resource strain: Not hard at all  . Food insecurity    Worry: Never true    Inability: Never true  . Transportation needs    Medical: No    Non-medical: No  Tobacco Use  . Smoking status: Former Smoker    Packs/day: 0.50    Years: 30.00    Pack years: 15.00    Types: Cigarettes    Quit date: 11/05/1984    Years since quitting: 34.6  . Smokeless tobacco: Never Used  Substance and Sexual Activity  . Alcohol use: No  . Drug use: No  . Sexual activity: Yes  Lifestyle  .  Physical activity    Days per week: 3 days    Minutes per session: 20 min  . Stress: Not at all  Relationships  . Social connections    Talks on phone: More than three times a week    Gets together: More than three times a week    Attends religious service: More than 4 times per year    Active member of club or organization: No    Attends meetings of clubs or organizations: Never    Relationship status: Married  Other Topics Concern  . Not on file  Social History Narrative  . Not on file  Activities of Daily Living In your present state of health, do you have any difficulty performing the following activities: 06/24/2019  Hearing? N  Vision? N  Difficulty concentrating or making decisions? N  Walking or climbing stairs? N  Dressing or bathing? N  Doing errands, shopping? N  Preparing Food and eating ? N  Using the Toilet? N  In the past six months, have you accidently leaked urine? N  Do you have problems with loss of bowel control? N  Managing your Medications? N  Managing your Finances? N  Housekeeping or managing your Housekeeping? N  Some recent data might be hidden    Patient Education/ Literacy How often do you need to have someone help you when you read instructions, pamphlets, or other written materials from your doctor or pharmacy?: 1 - Never What is the last grade level you completed in school?: 9th Grade  Exercise Current Exercise Habits: The patient does not participate in regular exercise at present, Exercise limited by: None identified  Diet Patient reports consuming 3 meals a day and 1 snack(s) a day Patient reports that his primary diet is: Diabetic Patient reports that she does have regular access to food.   Depression Screen PHQ 2/9 Scores 06/24/2019 04/11/2019 12/20/2018 08/19/2018 06/13/2018 04/18/2018 04/11/2018  PHQ - 2 Score 0 0 0 0 0 0 0     Fall Risk Fall Risk  06/24/2019 04/11/2019 12/20/2018 08/19/2018 06/13/2018  Falls in the past year? 0 0 0 No No   Number falls in past yr: 0 - - - -  Injury with Fall? 0 - - - -     Objective:  Michael Trevino seemed alert and oriented and he participated appropriately during our telephone visit.  Blood Pressure Weight BMI  BP Readings from Last 3 Encounters:  04/11/19 (!) 174/68  12/20/18 (!) 177/65  12/19/18 (!) 145/70   Wt Readings from Last 3 Encounters:  06/24/19 255 lb 8.2 oz (115.9 kg)  04/11/19 255 lb 9.6 oz (115.9 kg)  12/20/18 252 lb 3.2 oz (114.4 kg)   BMI Readings from Last 1 Encounters:  06/24/19 38.85 kg/m    *Unable to obtain current vital signs, weight, and BMI due to telephone visit type  Hearing/Vision  . Lyndol did not seem to have difficulty with hearing/understanding during the telephone conversation . Reports that he has not had a formal eye exam by an eye care professional within the past year . Reports that he has not had a formal hearing evaluation within the past year *Unable to fully assess hearing and vision during telephone visit type  Cognitive Function: 6CIT Screen 06/24/2019  What Year? 0 points  What month? 0 points  What time? 0 points  Count back from 20 0 points  Months in reverse 0 points  Repeat phrase 0 points  Total Score 0   (Normal:0-7, Significant for Dysfunction: >8)  Normal Cognitive Function Screening: Yes   Immunization & Health Maintenance Record Immunization History  Administered Date(s) Administered  . Influenza Whole 08/25/2008  . Influenza, High Dose Seasonal PF 08/30/2016, 08/16/2017, 08/19/2018  . Influenza,inj,Quad PF,6+ Mos 08/19/2014, 08/25/2015  . Pneumococcal Conjugate-13 04/06/2015  . Pneumococcal Polysaccharide-23 10/06/2005, 12/12/2007    Health Maintenance  Topic Date Due  . Samul Dada  02/08/1960  . OPHTHALMOLOGY EXAM  06/29/2016  . FOOT EXAM  08/16/2018  . INFLUENZA VACCINE  06/14/2019  . HEMOGLOBIN A1C  10/12/2019  . PNA vac Low Risk Adult  Completed  Assessment  This is a routine wellness  examination for [employer].  Health Maintenance: Due or Overdue Health Maintenance Due  Topic Date Due  . TETANUS/TDAP  02[birth year]08/1960  . OPHTHALMOLOGY EXAM  06/29/2016  . FOOT EXAM  08/16/2018  . INFLUENZA VACCINE  06/14/2019    Michael Trevino does not need a referral for Community Assistance: Care Management:   no Social Work:    no Prescription Assistance:  no Nutrition/Diabetes Education:  no   Plan:  Personalized Goals Goals Addressed   None    Personalized Health Maintenance & Screening Recommendations  Td vaccine Diabetes screening Glaucoma screening Advanced directives: has NO advanced directive  - add't info requested. Referral to SW: no  Lung Cancer Screening Recommended: no (Low Dose CT Chest recommended if Age 61-80 years, 30 pack-year currently smoking OR have quit w/in past 15 years) Hepatitis C Screening recommended: no HIV Screening recommended: no  Advanced Directives: Written information was prepared per patient's request.  Referrals & Orders No orders of the defined types were placed in this encounter.   Follow-up Plan . Follow-up with Dettinger, Fransisca Kaufmann, MD as planned   I have personally reviewed and noted the following in the patient's chart:   . Medical and social history . Use of alcohol, tobacco or illicit drugs  . Current medications and supplements . Functional ability and status . Nutritional status . Physical activity . Advanced directives . List of other physicians . Hospitalizations, surgeries, and ER visits in previous 12 months . Vitals . Screenings to include cognitive, depression, and falls . Referrals and appointments  In addition, I have reviewed and discussed with Michael Trevino certain preventive protocols, quality metrics, and best practice recommendations. A written personalized care plan for preventive services as well as general preventive health recommendations is available and can be mailed to the patient at his request.       Wardell Heath, LPN  [id number]   I have reviewed and agree with the above AWV documentation.   Evelina Dun, FNP

## 2019-06-24 NOTE — Patient Instructions (Signed)
Michael Trevino , Thank you for taking time to come for your Medicare Wellness Visit. I appreciate your ongoing commitment to your health goals. Please review the following plan we discussed and let me know if I can assist you in the future.   These are the goals we discussed: Goals     Exercise 150 min/wk Moderate Activity       This is a list of the screening recommended for you and due dates:  Health Maintenance  Topic Date Due   Tetanus Vaccine  02[birth year]08/1960   Eye exam for diabetics  06/29/2016   Complete foot exam   08/16/2018   Flu Shot  06/14/2019   Hemoglobin A1C  10/12/2019   Pneumonia vaccines  Completed    Advance Directive  Advance directives are legal documents that let you make choices ahead of time about your health care and medical treatment in case you become unable to communicate for yourself. Advance directives are a way for you to communicate your wishes to family, friends, and health care providers. This can help convey your decisions about end-of-life care if you become unable to communicate. Discussing and writing advance directives should happen over time rather than all at once. Advance directives can be changed depending on your situation and what you want, even after you have signed the advance directives. If you do not have an advance directive, some states assign family decision makers to act on your behalf based on how closely you are related to them. Each state has its own laws regarding advance directives. You may want to check with your health care provider, attorney, or state representative about the laws in your state. There are different types of advance directives, such as:  Medical power of attorney.  Living will.  Do not resuscitate (DNR) or do not attempt resuscitation (DNAR) order. Health care proxy and medical power of attorney A health care proxy, also called a health care agent, is a person who is appointed to make medical decisions for you  in cases in which you are unable to make the decisions yourself. Generally, people choose someone they know well and trust to represent their preferences. Make sure to ask this person for an agreement to act as your proxy. A proxy may have to exercise judgment in the event of a medical decision for which your wishes are not known. A medical power of attorney is a legal document that names your health care proxy. Depending on the laws in your state, after the document is written, it may also need to be:  Signed.  Notarized.  Dated.  Copied.  Witnessed.  Incorporated into your medical record. You may also want to appoint someone to manage your financial affairs in a situation in which you are unable to do so. This is called a durable power of attorney for finances. It is a separate legal document from the durable power of attorney for health care. You may choose the same person or someone different from your health care proxy to act as your agent in financial matters. If you do not appoint a proxy, or if there is a concern that the proxy is not acting in your best interests, a court-appointed guardian may be designated to act on your behalf. Living will A living will is a set of instructions documenting your wishes about medical care when you cannot express them yourself. Health care providers should keep a copy of your living will in your medical record. You may want  to give a copy to family members or friends. To alert caregivers in case of an emergency, you can place a card in your wallet to let them know that you have a living will and where they can find it. A living will is used if you become:  Terminally ill.  Incapacitated.  Unable to communicate or make decisions. Items to consider in your living will include:  The use or non-use of life-sustaining equipment, such as dialysis machines and breathing machines (ventilators).  A DNR or DNAR order, which is the instruction not to use  cardiopulmonary resuscitation (CPR) if breathing or heartbeat stops.  The use or non-use of tube feeding.  Withholding of food and fluids.  Comfort (palliative) care when the goal becomes comfort rather than a cure.  Organ and tissue donation. A living will does not give instructions for distributing your money and property if you should pass away. It is recommended that you seek the advice of a lawyer when writing a will. Decisions about taxes, beneficiaries, and asset distribution will be legally binding. This process can relieve your family and friends of any concerns surrounding disputes or questions that may come up about the distribution of your assets. DNR or DNAR A DNR or DNAR order is a request not to have CPR in the event that your heart stops beating or you stop breathing. If a DNR or DNAR order has not been made and shared, a health care provider will try to help any patient whose heart has stopped or who has stopped breathing. If you plan to have surgery, talk with your health care provider about how your DNR or DNAR order will be followed if problems occur. Summary  Advance directives are the legal documents that allow you to make choices ahead of time about your health care and medical treatment in case you become unable to communicate for yourself.  The process of discussing and writing advance directives should happen over time. You can change the advance directives, even after you have signed them.  Advance directives include DNR or DNAR orders, living wills, and designating an agent as your medical power of attorney. This information is not intended to replace advice given to you by your health care provider. Make sure you discuss any questions you have with your health care provider. Document Released: 02/06/2008 Document Revised: 12/04/2018 Document Reviewed: 09/18/2016 Elsevier Patient Education  [place].    BMI for Adults  Body mass index (BMI) is a number  that is calculated from a person's weight and height. BMI may help to estimate how much of a person's weight is composed of fat. BMI can help identify those who may be at higher risk for certain medical problems. How is BMI used with adults? BMI is used as a screening tool to identify possible weight problems. It is used to check whether a person is obese, overweight, healthy weight, or underweight. How is BMI calculated? BMI measures your weight and compares it to your height. This can be done either in [place] ([place] or metric measurements. Note that charts are available to help you find your BMI quickly and easily without having to do these calculations yourself. To calculate your BMI in [place] ([place] measurements, your health care provider will: 1. Measure your weight in pounds (lb). 2. Multiply the number of pounds by 703. ? For example, for a person who weighs 180 lb, multiply that number by 703, which equals 126,540. 3. Measure your height in inches (in).  Then multiply that number by itself to get a measurement called "inches squared." ? For example, for a person who is 70 in tall, the "inches squared" measurement is 70 in x 70 in, which equals 4900 inches squared. 4. Divide the total from Step 2 (number of lb x 703) by the total from Step 3 (inches squared): 126,540  4900 = 25.8. This is your BMI. To calculate your BMI in metric measurements, your health care provider will: 1. Measure your weight in kilograms (kg). 2. Measure your height in meters (m). Then multiply that number by itself to get a measurement called "meters squared." ? For example, for a person who is 1.75 m tall, the "meters squared" measurement is 1.75 m x 1.75 m, which is equal to 3.1 meters squared. 3. Divide the number of kilograms (your weight) by the meters squared number. In this example: 70  3.1 = 22.6. This is your BMI. How is BMI interpreted? To interpret your results, your health care provider will use BMI  charts to identify whether you are underweight, normal weight, overweight, or obese. The following guidelines will be used:  Underweight: BMI less than 18.5.  Normal weight: BMI between 18.5 and 24.9.  Overweight: BMI between 25 and 29.9.  Obese: BMI of 30 and above. Please note:  Weight includes both fat and muscle, so someone with a muscular build, such as an athlete, may have a BMI that is higher than 24.9. In cases like these, BMI is not an accurate measure of body fat.  To determine if excess body fat is the cause of a BMI of 25 or higher, further assessments may need to be done by a health care provider.  BMI is usually interpreted in the same way for men and women. Why is BMI a useful tool? BMI is useful in two ways:  Identifying a weight problem that may be related to a medical condition, or that may increase the risk for medical problems.  Promoting lifestyle and diet changes in order to reach a healthy weight. Summary  Body mass index (BMI) is a number that is calculated from a person's weight and height.  BMI may help to estimate how much of a person's weight is composed of fat. BMI can help identify those who may be at higher risk for certain medical problems.  BMI can be measured using English measurements or metric measurements.  To interpret your results, your health care provider will use BMI charts to identify whether you are underweight, normal weight, overweight, or obese. This information is not intended to replace advice given to you by your health care provider. Make sure you discuss any questions you have with your health care provider. Document Released: 07/11/2004 Document Revised: 10/12/2017 Document Reviewed: 09/12/2017 Elsevier Patient Education  2020 [employer].

## 2019-06-26 ENCOUNTER — Encounter: Payer: Self-pay | Admitting: Family Medicine

## 2019-06-26 ENCOUNTER — Ambulatory Visit (INDEPENDENT_AMBULATORY_CARE_PROVIDER_SITE_OTHER): Payer: Medicare Other | Admitting: Family Medicine

## 2019-06-26 ENCOUNTER — Other Ambulatory Visit: Payer: Self-pay

## 2019-06-26 DIAGNOSIS — E1159 Type 2 diabetes mellitus with other circulatory complications: Secondary | ICD-10-CM

## 2019-06-26 DIAGNOSIS — E782 Mixed hyperlipidemia: Secondary | ICD-10-CM

## 2019-06-26 DIAGNOSIS — I10 Essential (primary) hypertension: Secondary | ICD-10-CM

## 2019-06-26 DIAGNOSIS — E1122 Type 2 diabetes mellitus with diabetic chronic kidney disease: Secondary | ICD-10-CM | POA: Diagnosis not present

## 2019-06-26 DIAGNOSIS — N184 Chronic kidney disease, stage 4 (severe): Secondary | ICD-10-CM

## 2019-06-26 DIAGNOSIS — N183 Chronic kidney disease, stage 3 unspecified: Secondary | ICD-10-CM

## 2019-06-26 DIAGNOSIS — E039 Hypothyroidism, unspecified: Secondary | ICD-10-CM | POA: Diagnosis not present

## 2019-06-26 MED ORDER — AMLODIPINE BESYLATE 10 MG PO TABS: 10.0000 mg | ORAL_TABLET | Freq: Every day | ORAL | 3 refills | Status: DC

## 2019-06-26 MED ORDER — METOPROLOL TARTRATE 100 MG PO TABS: 100.0000 mg | ORAL_TABLET | Freq: Two times a day (BID) | ORAL | 3 refills | Status: DC

## 2019-06-26 NOTE — Progress Notes (Signed)
Virtual Visit via telephone Note  I connected with Michael Trevino on 06/26/19 at 0758 by telephone and verified that I am speaking with the correct person using two identifiers. Michael Trevino is currently located at home and wife are currently with her during visit. The provider, Fransisca Kaufmann Jacey Eckerson, MD is located in their office at time of visit.  Call ended at [place]  I discussed the limitations, risks, security and privacy concerns of performing an evaluation and management service by telephone and the availability of in person appointments. I also discussed with the patient that there may be a patient responsible charge related to this service. The patient expressed understanding and agreed to proceed.   History and Present Illness: Type 2 diabetes mellitus Patient comes in today for recheck of his diabetes. Patient has been currently taking levemir 30 and tradjenta and glimiperide and bs 106 and 123 and 100 and 95, 100, 92, 84, 97, 95, 106. Patient is currently on an ACE inhibitor/ARB. Patient has not seen an ophthalmologist this year. Patient denies any issues with their feet.  Patient  Has chronic kidney disease.   Hypertension Patient is currently on amlodipine and metoprolol and olmesartan, and their blood pressure today is 132/62, 142/63. Patient denies any lightheadedness or dizziness. Patient denies headaches, blurred vision, chest pains, shortness of breath, or weakness. Denies any side effects from medication and is content with current medication.   Hyperlipidemia Patient is coming in for recheck of his hyperlipidemia. The patient is currently taking atorvastatin. They deny any issues with myalgias or history of liver damage from it. They deny any focal numbness or weakness or chest pain.   Hypothyroidism recheck Patient is coming in for thyroid recheck today as well. They deny any issues with hair changes or heat or cold problems or diarrhea or constipation. They deny any chest pain  or palpitations. They are currently on levothyroxine 131micrograms    No diagnosis found.  Outpatient Encounter Medications as of 06/26/2019  Medication Sig  . amLODipine (NORVASC) 10 MG tablet Take 1 tablet (10 mg total) by mouth daily.  Marland Kitchen ascorbic acid (VITAMIN C) 500 MG tablet Take 500 mg by mouth daily.  Marland Kitchen aspirin EC 81 MG tablet Take 81 mg by mouth daily.  Marland Kitchen atorvastatin (LIPITOR) 40 MG tablet TAKE 1 TABLET BY MOUTH EVERY DAY  . Calcium Carbonate 500 MG CHEW Chew 1 tablet (500 mg total) by mouth 3 (three) times daily as needed. (Patient taking differently: Chew 500 mg by mouth daily. )  . furosemide (LASIX) 80 MG tablet Take 1 tablet (80 mg total) by mouth 2 (two) times daily.  Marland Kitchen glimepiride (AMARYL) 2 MG tablet TAKE 1 TABLET (2 MG TOTAL) BY MOUTH DAILY WITH BREAKFAST.  Marland Kitchen glucose blood test strip Use to check blood glucose once daily.  Dx:  E11.9  . Insulin Detemir (LEVEMIR FLEXPEN) 100 UNIT/ML Pen Inject 30 Units into the skin at bedtime.  Marland Kitchen levothyroxine (SYNTHROID) 150 MCG tablet Take 1 tablet (150 mcg total) by mouth daily before breakfast.  . linagliptin (TRADJENTA) 5 MG TABS tablet Take 1 tablet (5 mg total) by mouth daily.  . metoprolol tartrate (LOPRESSOR) 100 MG tablet Take 1 tablet (100 mg total) by mouth 2 (two) times daily.  Marland Kitchen olmesartan (BENICAR) 40 MG tablet Take 1 tablet (40 mg total) by mouth daily.   No facility-administered encounter medications on file as of 06/26/2019.     Review of Systems  Constitutional: Negative for chills and fever.  Respiratory: Negative for shortness of breath and wheezing.   Cardiovascular: Negative for chest pain and leg swelling.  Musculoskeletal: Negative for back pain and gait problem.  Skin: Negative for rash.  Neurological: Negative for dizziness, weakness, light-headedness and numbness.  All other systems reviewed and are negative.   Observations/Objective: Patient sounds comfortable and in no acute distress, will await blood  work because his endocrinologist and his nephrologist wants to do blood work and will have them checked things, if they do not then we will.  Assessment and Plan: Problem List Items Addressed This Visit      Cardiovascular and Mediastinum   DM type 2 causing vascular disease ([place])   Relevant Medications   metoprolol tartrate (LOPRESSOR) 100 MG tablet   amLODipine (NORVASC) 10 MG tablet   Essential hypertension, benign   Relevant Medications   metoprolol tartrate (LOPRESSOR) 100 MG tablet   amLODipine (NORVASC) 10 MG tablet     Endocrine   Hypothyroidism   Relevant Medications   metoprolol tartrate (LOPRESSOR) 100 MG tablet   CKD stage 4 due to type 2 diabetes mellitus (HCC) - Primary     Genitourinary   CKD (chronic kidney disease) stage 3, GFR 30-59 ml/min (HCC)     Other   Mixed hyperlipidemia   Relevant Medications   metoprolol tartrate (LOPRESSOR) 100 MG tablet   amLODipine (NORVASC) 10 MG tablet       Follow Up Instructions:  Continue current medication, it sounds like at home the blood sugars and blood pressures are running a lot better.  Follow-up in 3 months   I discussed the assessment and treatment plan with the patient. The patient was provided an opportunity to ask questions and all were answered. The patient agreed with the plan and demonstrated an understanding of the instructions.   The patient was advised to call back or seek an in-person evaluation if the symptoms worsen or if the condition fails to improve as anticipated.  The above assessment and management plan was discussed with the patient. The patient verbalized understanding of and has agreed to the management plan. Patient is aware to call the clinic if symptoms persist or worsen. Patient is aware when to return to the clinic for a follow-up visit. Patient educated on when it is appropriate to go to the emergency department.    I provided 15 minutes of non-face-to-face time during this encounter.     Worthy Rancher, MD

## 2019-07-11 DIAGNOSIS — I351 Nonrheumatic aortic (valve) insufficiency: Secondary | ICD-10-CM | POA: Diagnosis not present

## 2019-07-11 DIAGNOSIS — E1129 Type 2 diabetes mellitus with other diabetic kidney complication: Secondary | ICD-10-CM | POA: Diagnosis not present

## 2019-07-11 DIAGNOSIS — E785 Hyperlipidemia, unspecified: Secondary | ICD-10-CM | POA: Diagnosis not present

## 2019-07-11 DIAGNOSIS — I129 Hypertensive chronic kidney disease with stage 1 through stage 4 chronic kidney disease, or unspecified chronic kidney disease: Secondary | ICD-10-CM | POA: Diagnosis not present

## 2019-07-11 DIAGNOSIS — E039 Hypothyroidism, unspecified: Secondary | ICD-10-CM | POA: Diagnosis not present

## 2019-07-11 DIAGNOSIS — E1122 Type 2 diabetes mellitus with diabetic chronic kidney disease: Secondary | ICD-10-CM | POA: Diagnosis not present

## 2019-07-11 DIAGNOSIS — N183 Chronic kidney disease, stage 3 (moderate): Secondary | ICD-10-CM | POA: Diagnosis not present

## 2019-07-11 LAB — HEPATIC FUNCTION PANEL
ALT: 15 (ref 10–40)
AST: 22 (ref 14–40)
Alkaline Phosphatase: 94 (ref 25–125)
Bilirubin, Total: 0.2

## 2019-07-11 LAB — TSH: TSH: 0.49 (ref 0.41–5.90)

## 2019-07-11 LAB — BASIC METABOLIC PANEL
BUN: 26 — AB (ref 4–21)
Creatinine: 2.6 — AB (ref 0.6–1.3)
Potassium: 4.7 (ref 3.4–5.3)
Sodium: 140 (ref 137–147)

## 2019-07-11 LAB — LIPID PANEL
Cholesterol: 160 (ref 0–200)
HDL: 27 — AB (ref 35–70)
LDL Cholesterol: 98
Triglycerides: 173 — AB (ref 40–160)

## 2019-07-11 LAB — HEMOGLOBIN A1C: Hemoglobin A1C: 7.4

## 2019-08-18 ENCOUNTER — Other Ambulatory Visit: Payer: Self-pay

## 2019-08-18 ENCOUNTER — Encounter: Payer: Self-pay | Admitting: "Endocrinology

## 2019-08-18 ENCOUNTER — Ambulatory Visit (INDEPENDENT_AMBULATORY_CARE_PROVIDER_SITE_OTHER): Payer: Medicare Other | Admitting: "Endocrinology

## 2019-08-18 DIAGNOSIS — E782 Mixed hyperlipidemia: Secondary | ICD-10-CM

## 2019-08-18 DIAGNOSIS — N183 Chronic kidney disease, stage 3 unspecified: Secondary | ICD-10-CM

## 2019-08-18 DIAGNOSIS — I10 Essential (primary) hypertension: Secondary | ICD-10-CM | POA: Diagnosis not present

## 2019-08-18 DIAGNOSIS — E039 Hypothyroidism, unspecified: Secondary | ICD-10-CM

## 2019-08-18 DIAGNOSIS — E1122 Type 2 diabetes mellitus with diabetic chronic kidney disease: Secondary | ICD-10-CM

## 2019-08-18 DIAGNOSIS — IMO0002 Reserved for concepts with insufficient information to code with codable children: Secondary | ICD-10-CM

## 2019-08-18 DIAGNOSIS — E1165 Type 2 diabetes mellitus with hyperglycemia: Secondary | ICD-10-CM

## 2019-08-18 NOTE — Progress Notes (Signed)
08/18/2019, 11:16 AM                                                    Endocrinology Telehealth Visit Follow up Note -During COVID -19 Pandemic  This visit type was conducted due to national recommendations for restrictions regarding the COVID-19 Pandemic  in an effort to limit this patient's exposure and mitigate transmission of the corona virus.  Due to his co-morbid illnesses, Michael Trevino is at  moderate to high risk for complications without adequate follow up.  This format is felt to be most appropriate for him at this time.  I connected with this patient on 08/18/2019   by telephone and verified that I am speaking with the correct person using two identifiers. Michael Trevino, 1941/03/16. he has verbally consented to this visit. All issues noted in this document were discussed and addressed. The format was not optimal for physical exam.    Subjective:    Patient ID: Michael Trevino, male    DOB: 1941-10-23.  he is being engaged in telehealth via telephone in follow-up for management of currently uncontrolled symptomatic type 2 diabetes, hyperlipidemia, hypertension, and hypothyroidism. PMD:   Dettinger, Fransisca Kaufmann, MD.   Past Medical History:  Diagnosis Date  . Arthritis   . Diabetes mellitus without complication ([place])   . Heart attack ([place])   . Hyperlipidemia   . Hypertension   . MI (myocardial infarction) ([place]) 2000  . Renal disorder   . Sepsis ([place])   . Sepsis due to Streptococcus, group B ([place]) 02/24/2013  . Stroke ([place])   . Thyroid disease    hypothyroid   Past Surgical History:  Procedure Laterality Date  . TEE WITHOUT CARDIOVERSION N/A 02/26/2013   Procedure: TRANSESOPHAGEAL ECHOCARDIOGRAM (TEE);  Surgeon: Thayer Headings, MD;  Location: [facility] ENDOSCOPY;  Service: Cardiovascular;  Laterality: N/A;   Social History   Socioeconomic History  . Marital status: Married    Spouse name: Not on file  .  Number of children: 6  . Years of education: 44  . Highest education level: 9th grade  Occupational History  . 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 Financial resource strain: Not hard at all  . Food insecurity    Worry: Never true    Inability: Never true  . Transportation needs    Medical: No    Non-medical: No  Tobacco Use  . Smoking status: Former Smoker    Packs/day: 0.50    Years: 30.00    Pack years: 15.00    Types: Cigarettes    Quit date: 11/05/1984    Years since quitting: 34.8  . Smokeless tobacco: Never Used  Substance and Sexual Activity  . Alcohol use: No  . Drug use: No  . Sexual activity: Yes  Lifestyle  . Physical activity    Days per week: 3 days  Minutes per session: 20 min  . Stress: Not at all  Relationships  . Social connections    Talks on phone: More than three times a week    Gets together: More than three times a week    Attends religious service: More than 4 times per year    Active member of club or organization: No    Attends meetings of clubs or organizations: Never    Relationship status: Married  Other Topics Concern  . Not on file  Social History Narrative  . Not on file   Outpatient Encounter Medications as of 08/18/2019  Medication Sig  . amLODipine (NORVASC) 10 MG tablet Take 1 tablet (10 mg total) by mouth daily.  Marland Kitchen ascorbic acid (VITAMIN C) 500 MG tablet Take 500 mg by mouth daily.  Marland Kitchen aspirin EC 81 MG tablet Take 81 mg by mouth daily.  Marland Kitchen atorvastatin (LIPITOR) 40 MG tablet TAKE 1 TABLET BY MOUTH EVERY DAY  . Calcium Carbonate 500 MG CHEW Chew 1 tablet (500 mg total) by mouth 3 (three) times daily as needed. (Patient taking differently: Chew 500 mg by mouth daily. )  . furosemide (LASIX) 80 MG tablet Take 1 tablet (80 mg total) by mouth 2 (two) times daily.  Marland Kitchen glimepiride (AMARYL) 2 MG tablet TAKE 1 TABLET (2 MG TOTAL) BY MOUTH  DAILY WITH BREAKFAST.  Marland Kitchen glucose blood test strip Use to check blood glucose once daily.  Dx:  E11.9  . Insulin Detemir (LEVEMIR FLEXPEN) 100 UNIT/ML Pen Inject 30 Units into the skin at bedtime.  Marland Kitchen levothyroxine (SYNTHROID) 150 MCG tablet Take 1 tablet (150 mcg total) by mouth daily before breakfast.  . linagliptin (TRADJENTA) 5 MG TABS tablet Take 1 tablet (5 mg total) by mouth daily.  . metoprolol tartrate (LOPRESSOR) 100 MG tablet Take 1 tablet (100 mg total) by mouth 2 (two) times daily.  Marland Kitchen olmesartan (BENICAR) 40 MG tablet Take 1 tablet (40 mg total) by mouth daily.   No facility-administered encounter medications on file as of 08/18/2019.     ALLERGIES: Allergies  Allergen Reactions  . Zocor [Simvastatin - High Dose] Nausea Only    Unknown    VACCINATION STATUS: Immunization History  Administered Date(s) Administered  . Influenza Whole 08/25/2008  . Influenza, High Dose Seasonal PF 08/30/2016, 08/16/2017, 08/19/2018  . Influenza,inj,Quad PF,6+ Mos 08/19/2014, 08/25/2015  . Pneumococcal Conjugate-13 04/06/2015  . Pneumococcal Polysaccharide-23 10/06/2005, 12/12/2007    Diabetes He presents for his follow-up diabetic visit. He has type 2 diabetes mellitus. Onset time: He was diagnosed at approximate age of 76 years. His disease course has been improving. There are no hypoglycemic associated symptoms. Pertinent negatives for hypoglycemia include no confusion, headaches, pallor or seizures. Pertinent negatives for diabetes include no blurred vision, no chest pain, no fatigue, no polydipsia, no polyphagia, no polyuria and no weakness. There are no hypoglycemic complications. Symptoms are improving. Diabetic complications include heart disease and nephropathy. Risk factors for coronary artery disease include diabetes mellitus, dyslipidemia, family history, hypertension, male sex, obesity, sedentary lifestyle and tobacco exposure. Current diabetic treatment includes insulin injections and  oral agent (dual therapy). His weight is decreasing steadily. He is following a generally unhealthy diet. When asked about meal planning, he reported none. He has not had a previous visit with a dietitian. He never participates in exercise. His breakfast blood glucose range is generally 130-140 mg/dl. His bedtime blood glucose range is generally 140-180 mg/dl. His overall blood glucose range is 140-180 mg/dl. An ACE  inhibitor/angiotensin II receptor blocker is being taken. He does not see a podiatrist.Eye exam is current.  Hyperlipidemia This is a chronic problem. The current episode started more than 1 year ago. Exacerbating diseases include diabetes, hypothyroidism and obesity. Pertinent negatives include no chest pain, myalgias or shortness of breath. Current antihyperlipidemic treatment includes statins. Risk factors for coronary artery disease include diabetes mellitus, dyslipidemia, hypertension, male sex, family history, obesity and a sedentary lifestyle.  Hypertension This is a chronic problem. The current episode started more than 1 year ago. The problem is uncontrolled. Pertinent negatives include no blurred vision, chest pain, headaches, neck pain, palpitations or shortness of breath. Risk factors for coronary artery disease include dyslipidemia, diabetes mellitus, family history, male gender, obesity, sedentary lifestyle and smoking/tobacco exposure. Past treatments include angiotensin blockers and calcium channel blockers. Hypertensive end-organ damage includes kidney disease and CAD/MI.    Review of systems: Limited as above.  Objective:    There were no vitals taken for this visit.  Wt Readings from Last 3 Encounters:  06/24/19 255 lb 8.2 oz (115.9 kg)  04/11/19 255 lb 9.6 oz (115.9 kg)  12/20/18 252 lb 3.2 oz (114.4 kg)     CMP     Component Value Date/Time   NA 140 07/11/2019   K 4.7 07/11/2019   CL 109 (H) 04/11/2019 0835   CO2 18 (L) 04/11/2019 0835   GLUCOSE 93 04/11/2019  0835   GLUCOSE 161 (H) 02/27/2013 0750   BUN 26 (A) 07/11/2019   CREATININE 2.6 (A) 07/11/2019   CREATININE 2.52 (H) 04/11/2019 0835   CALCIUM 7.5 (L) 04/11/2019 0835   PROT 5.7 (L) 04/11/2019 0835   ALBUMIN 3.2 (L) 04/11/2019 0835   AST 22 07/11/2019   ALT 15 07/11/2019   ALKPHOS 94 07/11/2019   BILITOT <0.2 04/11/2019 0835   GFRNONAA 23 (L) 04/11/2019 0835   GFRAA 27 (L) 04/11/2019 0835     Diabetic Labs (most recent): Lab Results  Component Value Date   HGBA1C 7.4 07/11/2019   HGBA1C 7.7 (H) 04/11/2019   HGBA1C 8.3 12/13/2018     Lipid Panel ( most recent) Lipid Panel     Component Value Date/Time   CHOL 160 07/11/2019   CHOL 163 04/11/2019 0835   CHOL 108 04/11/2013 1153   TRIG 173 (A) 07/11/2019   TRIG 98 07/17/2013 0940   TRIG 131 04/11/2013 1153   HDL 27 (A) 07/11/2019   HDL 28 (L) 04/11/2019 0835   HDL 31 (L) 07/17/2013 0940   HDL 33 (L) 04/11/2013 1153   CHOLHDL 5.8 (H) 04/11/2019 0835   LDLCALC 98 07/11/2019   LDLCALC 104 (H) 04/11/2019 0835   LDLCALC 49 07/17/2013 0940   LDLCALC 49 04/11/2013 1153      Lab Results  Component Value Date   TSH 0.459 04/11/2019   TSH 0.07 (A) 12/13/2018   TSH 5.540 (H) 08/26/2018   TSH 6.390 (H) 04/10/2018   TSH 8.370 (H) 01/07/2018   TSH 8.040 (H) 08/16/2017   TSH 7.640 (H) 04/16/2017   TSH 9.510 (H) 10/20/2016   TSH 7.360 (H) 04/11/2016   TSH 8.300 (H) 01/10/2016   FREET4 1.42 04/10/2018   FREET4 1.22 01/07/2018      Assessment & Plan:   1. DM type 2 causing vascular disease , Stage 3 renal insufficiency.  - Patient has currently uncontrolled symptomatic type 2 DM since  78 years of age. - Patient reports near target glycemic profile both fasting and postprandial, no major hypoglycemia nor  hyperglycemia.  -His previsit labs are reviewed with him with A1c of 7.4% progressively improving.  -Recent labs reviewed.  -his diabetes is complicated by coronary artery disease, stage 3 renal insufficiency,  obesity/sedentary life, illiteracy  and Zebulan Michelena remains at a high risk for more acute and chronic complications which include CAD, CVA, CKD, retinopathy, and neuropathy. These are all discussed in detail with the patient.  - I have counseled him on diet management and weight loss, by adopting a carbohydrate restricted/protein rich diet. -He still admits to dietary indiscretions including consumption of sweetened beverages.  - he  admits there is a room for improvement in his diet and drink choices. -  Suggestion is made for him to avoid simple carbohydrates  from his diet including Cakes, Sweet Desserts / Pastries, Ice Cream, Soda (diet and regular), Sweet Tea, Candies, Chips, Cookies, Sweet Pastries,  Store Bought Juices, Alcohol in Excess of  1-2 drinks a day, Artificial Sweeteners, Coffee Creamer, and "Sugar-free" Products. This will help patient to have stable blood glucose profile and potentially avoid unintended weight gain.   - I encouraged him to switch to  unprocessed or minimally processed complex starch and increased protein intake (animal or plant source), fruits, and vegetables.  - he is advised to stick to a routine mealtimes to eat 3 meals  a day and avoid unnecessary snacks ( to snack only to correct hypoglycemia).    - I have approached him with the following individualized plan to manage diabetes and patient agrees:    -Patient is illiterate, being helped by his elderly wife who can read and write.   -The #1 goal in his diabetes management will still be to avoid hypoglycemia.  -Target A1c for him will be between 8% and 8.5%.   -All attempts should be to keep treatment simple for them to follow.  -Given his presentation with that improved A1c was 7.4%, she will not need any higher dose of medications.   - He is advised to continue Levemir 30 units  daily at bedtime, continue strict monitoring of blood glucose 2 times daily- before breakfast and at bedtime, and as needed  anytime.   -Patient is encouraged to call clinic for blood glucose levels less than 70 or above 300 mg /dl. -Advised to  continue Tradjenta 5 mg by mouth every morning therapeutically suitable for patient . - Due to CKD, he is not a candidate for metformin nor SGLT2 inhibitors.   -He is advised to continue glimepiride at lower dose of 2 mg daily with breakfast.   - Patient specific target  A1c;  LDL, HDL, Triglycerides, and  Waist Circumference were discussed in detail.  2) BP/HTN: he is advised to home monitor blood pressure and report if > 140/90 on 2 separate readings.   He is advised to continue his current medications, including amlodipine 10 mg p.o. daily, Lasix 80 mg p.o. twice daily, irbesartan 300 mg p.o. daily, and metoprolol 100 mg p.o. twice daily.    3) Lipids/HPL : His most recent lipid panel showed controlled LDL at 63.  He is advised to continue atorvastatin 40 mg p.o. nightly.    4)  Weight/Diet: CDE Consult will be initiated , exercise, and detailed carbohydrates information provided.  5) hypothyroidism: Long-standing diagnosis -His previsit thyroid function tests are consistent with appropriate replacement.    He is advised to continue  levothyroxine to 150 mcg p.o. every morning.    - We discussed about the correct intake of his thyroid  hormone, on empty stomach at fasting, with water, separated by at least 30 minutes from breakfast and other medications,  and separated by more than 4 hours from calcium, iron, multivitamins, acid reflux medications (PPIs). -Patient is made aware of the fact that thyroid hormone replacement is needed for life, dose to be adjusted by periodic monitoring of thyroid function tests.   6) Chronic Care/Health Maintenance:  -he  is on ACEI/ARB and Statin medications and  is encouraged to continue to follow up with Ophthalmology, Dentist,  Podiatrist at least yearly or according to recommendations, and advised to  stay away from smoking. I have  recommended yearly flu vaccine and pneumonia vaccination at least every 5 years; and  sleep for at least 7 hours a day. - I advised patient to maintain close follow up with Dettinger, Fransisca Kaufmann, MD for primary care needs.  - Patient Care Time Today:  25 min, of which >50% was spent in  counseling and the rest reviewing his  current and  previous labs/studies, previous treatments, his blood glucose readings, and medications' doses and developing a plan for long-term care based on the latest recommendations for standards of care.   Michael Trevino participated in the discussions, expressed understanding, and voiced agreement with the above plans.  All questions were answered to his satisfaction. he is encouraged to contact clinic should he have any questions or concerns prior to his return visit.   Follow up plan: - Return in about 4 months (around 12/19/2019) for Bring Meter and Logs- A1c in Office, Include 8 log sheets.   Glade Lloyd, MD [facility] [address] [place], [place] [address] Phone: [phone]  Fax: [phone]    08/18/2019, 11:16 AM  This note was partially dictated with voice recognition software. Similar sounding words can be transcribed inadequately or may not  be corrected upon review.

## 2019-09-23 ENCOUNTER — Other Ambulatory Visit: Payer: Self-pay

## 2019-09-23 ENCOUNTER — Inpatient Hospital Stay (HOSPITAL_COMMUNITY)
Admission: EM | Admit: 2019-09-23 | Discharge: 2019-09-30 | DRG: 280 | Disposition: A | Discharge status: Home Health | Payer: Medicare Other | Attending: Internal Medicine | Admitting: Internal Medicine

## 2019-09-23 ENCOUNTER — Encounter (HOSPITAL_COMMUNITY): Payer: Self-pay

## 2019-09-23 ENCOUNTER — Emergency Department (HOSPITAL_COMMUNITY): Payer: Medicare Other

## 2019-09-23 DIAGNOSIS — R0600 Dyspnea, unspecified: Secondary | ICD-10-CM

## 2019-09-23 DIAGNOSIS — E782 Mixed hyperlipidemia: Secondary | ICD-10-CM | POA: Diagnosis present

## 2019-09-23 DIAGNOSIS — R0609 Other forms of dyspnea: Secondary | ICD-10-CM

## 2019-09-23 DIAGNOSIS — I5033 Acute on chronic diastolic (congestive) heart failure: Secondary | ICD-10-CM | POA: Diagnosis present

## 2019-09-23 DIAGNOSIS — M1990 Unspecified osteoarthritis, unspecified site: Secondary | ICD-10-CM | POA: Diagnosis not present

## 2019-09-23 DIAGNOSIS — J189 Pneumonia, unspecified organism: Secondary | ICD-10-CM | POA: Diagnosis not present

## 2019-09-23 DIAGNOSIS — Z794 Long term (current) use of insulin: Secondary | ICD-10-CM

## 2019-09-23 DIAGNOSIS — I130 Hypertensive heart and chronic kidney disease with heart failure and stage 1 through stage 4 chronic kidney disease, or unspecified chronic kidney disease: Principal | ICD-10-CM | POA: Diagnosis present

## 2019-09-23 DIAGNOSIS — Z8679 Personal history of other diseases of the circulatory system: Secondary | ICD-10-CM | POA: Diagnosis not present

## 2019-09-23 DIAGNOSIS — Z79890 Hormone replacement therapy (postmenopausal): Secondary | ICD-10-CM

## 2019-09-23 DIAGNOSIS — I2510 Atherosclerotic heart disease of native coronary artery without angina pectoris: Secondary | ICD-10-CM | POA: Diagnosis present

## 2019-09-23 DIAGNOSIS — Z8673 Personal history of transient ischemic attack (TIA), and cerebral infarction without residual deficits: Secondary | ICD-10-CM

## 2019-09-23 DIAGNOSIS — R918 Other nonspecific abnormal finding of lung field: Secondary | ICD-10-CM

## 2019-09-23 DIAGNOSIS — Z811 Family history of alcohol abuse and dependence: Secondary | ICD-10-CM

## 2019-09-23 DIAGNOSIS — Z20828 Contact with and (suspected) exposure to other viral communicable diseases: Secondary | ICD-10-CM | POA: Diagnosis present

## 2019-09-23 DIAGNOSIS — N179 Acute kidney failure, unspecified: Secondary | ICD-10-CM | POA: Diagnosis present

## 2019-09-23 DIAGNOSIS — E8809 Other disorders of plasma-protein metabolism, not elsewhere classified: Secondary | ICD-10-CM

## 2019-09-23 DIAGNOSIS — E1122 Type 2 diabetes mellitus with diabetic chronic kidney disease: Secondary | ICD-10-CM | POA: Diagnosis present

## 2019-09-23 DIAGNOSIS — I252 Old myocardial infarction: Secondary | ICD-10-CM | POA: Diagnosis not present

## 2019-09-23 DIAGNOSIS — I10 Essential (primary) hypertension: Secondary | ICD-10-CM | POA: Diagnosis present

## 2019-09-23 DIAGNOSIS — I351 Nonrheumatic aortic (valve) insufficiency: Secondary | ICD-10-CM | POA: Diagnosis not present

## 2019-09-23 DIAGNOSIS — Z888 Allergy status to other drugs, medicaments and biological substances status: Secondary | ICD-10-CM

## 2019-09-23 DIAGNOSIS — Z841 Family history of disorders of kidney and ureter: Secondary | ICD-10-CM

## 2019-09-23 DIAGNOSIS — Z8249 Family history of ischemic heart disease and other diseases of the circulatory system: Secondary | ICD-10-CM

## 2019-09-23 DIAGNOSIS — E1165 Type 2 diabetes mellitus with hyperglycemia: Secondary | ICD-10-CM | POA: Diagnosis not present

## 2019-09-23 DIAGNOSIS — Z823 Family history of stroke: Secondary | ICD-10-CM | POA: Diagnosis not present

## 2019-09-23 DIAGNOSIS — Z833 Family history of diabetes mellitus: Secondary | ICD-10-CM

## 2019-09-23 DIAGNOSIS — R0602 Shortness of breath: Secondary | ICD-10-CM | POA: Diagnosis not present

## 2019-09-23 DIAGNOSIS — N184 Chronic kidney disease, stage 4 (severe): Secondary | ICD-10-CM | POA: Diagnosis present

## 2019-09-23 DIAGNOSIS — E8770 Fluid overload, unspecified: Secondary | ICD-10-CM | POA: Diagnosis present

## 2019-09-23 DIAGNOSIS — Z09 Encounter for follow-up examination after completed treatment for conditions other than malignant neoplasm: Secondary | ICD-10-CM

## 2019-09-23 DIAGNOSIS — IMO0002 Reserved for concepts with insufficient information to code with codable children: Secondary | ICD-10-CM | POA: Diagnosis present

## 2019-09-23 DIAGNOSIS — Z87891 Personal history of nicotine dependence: Secondary | ICD-10-CM

## 2019-09-23 DIAGNOSIS — Z23 Encounter for immunization: Secondary | ICD-10-CM | POA: Diagnosis not present

## 2019-09-23 DIAGNOSIS — N183 Chronic kidney disease, stage 3 unspecified: Secondary | ICD-10-CM

## 2019-09-23 DIAGNOSIS — I21A1 Myocardial infarction type 2: Secondary | ICD-10-CM | POA: Diagnosis present

## 2019-09-23 DIAGNOSIS — Z809 Family history of malignant neoplasm, unspecified: Secondary | ICD-10-CM

## 2019-09-23 DIAGNOSIS — R601 Generalized edema: Secondary | ICD-10-CM

## 2019-09-23 DIAGNOSIS — Z7982 Long term (current) use of aspirin: Secondary | ICD-10-CM | POA: Diagnosis not present

## 2019-09-23 DIAGNOSIS — M1991 Primary osteoarthritis, unspecified site: Secondary | ICD-10-CM | POA: Diagnosis present

## 2019-09-23 DIAGNOSIS — R600 Localized edema: Secondary | ICD-10-CM | POA: Diagnosis not present

## 2019-09-23 DIAGNOSIS — E039 Hypothyroidism, unspecified: Secondary | ICD-10-CM | POA: Diagnosis present

## 2019-09-23 DIAGNOSIS — I5031 Acute diastolic (congestive) heart failure: Secondary | ICD-10-CM | POA: Diagnosis not present

## 2019-09-23 DIAGNOSIS — I509 Heart failure, unspecified: Secondary | ICD-10-CM | POA: Diagnosis present

## 2019-09-23 LAB — COMPREHENSIVE METABOLIC PANEL
ALT: 16 U/L (ref 0–44)
AST: 19 U/L (ref 15–41)
Albumin: 2.6 g/dL — ABNORMAL LOW (ref 3.5–5.0)
Alkaline Phosphatase: 85 U/L (ref 38–126)
Anion gap: 8 (ref 5–15)
BUN: 32 mg/dL — ABNORMAL HIGH (ref 8–23)
CO2: 20 mmol/L — ABNORMAL LOW (ref 22–32)
Calcium: 6.7 mg/dL — ABNORMAL LOW (ref 8.9–10.3)
Chloride: 109 mmol/L (ref 98–111)
Creatinine, Ser: 3.02 mg/dL — ABNORMAL HIGH (ref 0.61–1.24)
GFR calc Af Amer: 22 mL/min — ABNORMAL LOW (ref >60)
GFR calc non Af Amer: 19 mL/min — ABNORMAL LOW (ref >60)
Glucose, Bld: 145 mg/dL — ABNORMAL HIGH (ref 70–99)
Potassium: 3.9 mmol/L (ref 3.5–5.1)
Sodium: 137 mmol/L (ref 135–145)
Total Bilirubin: 0.5 mg/dL (ref 0.3–1.2)
Total Protein: 6.1 g/dL — ABNORMAL LOW (ref 6.5–8.1)

## 2019-09-23 LAB — CBC WITH DIFFERENTIAL/PLATELET
Abs Immature Granulocytes: 0.01 10*3/uL (ref 0.00–0.07)
Basophils Absolute: 0 10*3/uL (ref 0.0–0.1)
Basophils Relative: 0 %
Eosinophils Absolute: 0 10*3/uL (ref 0.0–0.5)
Eosinophils Relative: 1 %
HCT: 31.4 % — ABNORMAL LOW (ref 39.0–52.0)
Hemoglobin: 9.9 g/dL — ABNORMAL LOW (ref 13.0–17.0)
Immature Granulocytes: 0 %
Lymphocytes Relative: 32 %
Lymphs Abs: 1.7 10*3/uL (ref 0.7–4.0)
MCH: 28.8 pg (ref 26.0–34.0)
MCHC: 31.5 g/dL (ref 30.0–36.0)
MCV: 91.3 fL (ref 80.0–100.0)
Monocytes Absolute: 0.4 10*3/uL (ref 0.1–1.0)
Monocytes Relative: 7 %
Neutro Abs: 3.2 10*3/uL (ref 1.7–7.7)
Neutrophils Relative %: 60 %
Platelets: 195 10*3/uL (ref 150–400)
RBC: 3.44 MIL/uL — ABNORMAL LOW (ref 4.22–5.81)
RDW: 14.8 % (ref 11.5–15.5)
WBC: 5.4 10*3/uL (ref 4.0–10.5)
nRBC: 0 % (ref 0.0–0.2)

## 2019-09-23 LAB — TROPONIN I (HIGH SENSITIVITY)
Troponin I (High Sensitivity): 47 ng/L — ABNORMAL HIGH (ref <18)
Troponin I (High Sensitivity): 47 ng/L — ABNORMAL HIGH (ref <18)

## 2019-09-23 LAB — PROCALCITONIN: Procalcitonin: <0.1 ng/mL

## 2019-09-23 LAB — TRIGLYCERIDES: Triglycerides: 173 mg/dL — ABNORMAL HIGH (ref <150)

## 2019-09-23 LAB — FERRITIN: Ferritin: 67 ng/mL (ref 24–336)

## 2019-09-23 LAB — BRAIN NATRIURETIC PEPTIDE: B Natriuretic Peptide: 297 pg/mL — ABNORMAL HIGH (ref 0.0–100.0)

## 2019-09-23 LAB — LACTIC ACID, PLASMA
Lactic Acid, Venous: 1 mmol/L (ref 0.5–1.9)
Lactic Acid, Venous: 1 mmol/L (ref 0.5–1.9)

## 2019-09-23 LAB — FIBRINOGEN: Fibrinogen: 732 mg/dL — ABNORMAL HIGH (ref 210–475)

## 2019-09-23 LAB — D-DIMER, QUANTITATIVE: D-Dimer, Quant: 2.7 ug/mL-FEU — ABNORMAL HIGH (ref 0.00–0.50)

## 2019-09-23 LAB — C-REACTIVE PROTEIN: CRP: 2.3 mg/dL — ABNORMAL HIGH (ref <1.0)

## 2019-09-23 LAB — LACTATE DEHYDROGENASE: LDH: 208 U/L — ABNORMAL HIGH (ref 98–192)

## 2019-09-23 MED ORDER — FUROSEMIDE 10 MG/ML IJ SOLN
40.0000 mg | INTRAMUSCULAR | Status: AC
  Administered 2019-09-23: 40 mg via INTRAVENOUS
  Filled 2019-09-23: qty 4

## 2019-09-23 MED ORDER — SODIUM CHLORIDE 0.9 % IV SOLN
1.0000 g | Freq: Once | INTRAVENOUS | Status: AC
  Administered 2019-09-23: 1 g via INTRAVENOUS
  Filled 2019-09-23: qty 10

## 2019-09-23 MED ORDER — SODIUM CHLORIDE 0.9 % IV SOLN
500.0000 mg | Freq: Once | INTRAVENOUS | Status: AC
  Administered 2019-09-24: 500 mg via INTRAVENOUS
  Filled 2019-09-23: qty 500

## 2019-09-23 NOTE — ED Triage Notes (Signed)
Pt presents to ED with increased SOB x 2-3 days. Pt denies fever, or cough.

## 2019-09-23 NOTE — ED Notes (Signed)
Forrestine Him - daughter   [phone]  Call if any updates.

## 2019-09-23 NOTE — ED Provider Notes (Signed)
[facility] EMERGENCY DEPARTMENT Provider Note   CSN: [id number] Arrival date & time: 09/23/19  1124     History   Chief Complaint Chief Complaint  Patient presents with  . Shortness of Breath    HPI Michael Trevino is a 78 y.o. male.     HPI  This patient is a 78 year old male, he has a known history of diabetes, prior history of myocardial infarction, prior history of stroke, presents to the hospital with a complaint of shortness of breath.  He reports that this is been getting worse over the last week, it is significantly worse with exertion, he feels like he cannot walk around the corner without becoming so severely short of breath that he has to slow down.  He does recall having some dyspnea on exertion related to prior heart attack in the past but has not had anything like this.  He has had some swelling of his legs which started about 1 year ago, this is been progressively worsening and seems to have gotten worse this last week.  He denies cough, fever, denies any chest pain.  He has not been evaluated prior to this evaluation today.  Past Medical History:  Diagnosis Date  . Arthritis   . Diabetes mellitus without complication ([place])   . Heart attack ([place])   . Hyperlipidemia   . Hypertension   . MI (myocardial infarction) ([place]) 2000  . Renal disorder   . Sepsis ([place])   . Sepsis due to Streptococcus, group B ([place]) 02/24/2013  . Stroke ([place])   . Thyroid disease    hypothyroid    Patient Active Problem List   Diagnosis Date Noted  . CKD stage 4 due to type 2 diabetes mellitus ([place]) 01/09/2018  . Essential hypertension, benign 09/11/2017  . Class 2 severe obesity due to excess calories with serious comorbidity and body mass index (BMI) of 37.0 to 37.9 in adult ([place]) 09/11/2017  . Nonrheumatic aortic valve insufficiency 05/03/2017  . Uncontrolled type 2 diabetes mellitus with stage 3 chronic kidney disease ([place]) 05/03/2017  . Arthritis   . Erectile dysfunction 04/11/2013   . Mixed hyperlipidemia 04/11/2013  . Hypothyroidism 02/08/2011  . CKD (chronic kidney disease) stage 3, GFR 30-59 ml/min 02/08/2011  . Illiterate 02/08/2011  . CAD (coronary artery disease) 02/08/2011    Past Surgical History:  Procedure Laterality Date  . TEE WITHOUT CARDIOVERSION N/A 02/26/2013   Procedure: TRANSESOPHAGEAL ECHOCARDIOGRAM (TEE);  Surgeon: Thayer Headings, MD;  Location: [place];  Service: Cardiovascular;  Laterality: N/A;        Home Medications    Prior to Admission medications   Medication Sig Start Date End Date Taking? Authorizing Provider  amLODipine (NORVASC) 10 MG tablet Take 1 tablet (10 mg total) by mouth daily. 06/26/19   Dettinger, Fransisca Kaufmann, MD  ascorbic acid (VITAMIN C) 500 MG tablet Take 500 mg by mouth daily.    [provider]  aspirin EC 81 MG tablet Take 81 mg by mouth daily.    [provider]  atorvastatin (LIPITOR) 40 MG tablet TAKE 1 TABLET BY MOUTH EVERY DAY 04/11/19   Dettinger, Fransisca Kaufmann, MD  Calcium Carbonate 500 MG CHEW Chew 1 tablet (500 mg total) by mouth 3 (three) times daily as needed. Patient taking differently: Chew 500 mg by mouth daily.  05/01/16   Cherre Robins, PharmD  furosemide (LASIX) 80 MG tablet Take 1 tablet (80 mg total) by mouth 2 (two) times daily. 04/11/19   Dettinger, Vonna Kotyk  A, MD  glimepiride (AMARYL) 2 MG tablet TAKE 1 TABLET (2 MG TOTAL) BY MOUTH DAILY WITH BREAKFAST. 04/11/19   Nida, Marella Chimes, MD  glucose blood test strip Use to check blood glucose once daily.  Dx:  E11.9 07/14/15   Wardell Honour, MD  Insulin Detemir (LEVEMIR FLEXPEN) 100 UNIT/ML Pen Inject 30 Units into the skin at bedtime. 04/11/19   Dettinger, Fransisca Kaufmann, MD  levothyroxine (SYNTHROID) 150 MCG tablet Take 1 tablet (150 mcg total) by mouth daily before breakfast. 04/11/19   Dettinger, Fransisca Kaufmann, MD  linagliptin (TRADJENTA) 5 MG TABS tablet Take 1 tablet (5 mg total) by mouth daily. 04/11/19   Dettinger, Fransisca Kaufmann, MD  metoprolol  tartrate (LOPRESSOR) 100 MG tablet Take 1 tablet (100 mg total) by mouth 2 (two) times daily. 06/26/19   Dettinger, Fransisca Kaufmann, MD  olmesartan (BENICAR) 40 MG tablet Take 1 tablet (40 mg total) by mouth daily. 04/11/19   Dettinger, Fransisca Kaufmann, MD    Family History Family History  Problem Relation Age of Onset  . Diabetes Mother   . Hypertension Mother   . Cancer Father   . Stroke Brother   . Alcohol abuse Brother   . Diabetes Brother   . Diabetes Sister   . Diabetes Brother 13       TYPE 1  . Kidney disease Brother 53       DIALYSIS  . Heart attack Brother   . Healthy Daughter   . Healthy Son   . Healthy Daughter   . Healthy Son   . Healthy Son   . Healthy Son     Social History Social History   Tobacco Use  . Smoking status: Former Smoker    Packs/day: 0.50    Years: 30.00    Pack years: 15.00    Types: Cigarettes    Quit date: 11/05/1984    Years since quitting: 34.9  . Smokeless tobacco: Never Used  Substance Use Topics  . Alcohol use: No  . Drug use: No     Allergies   Zocor [simvastatin - high dose]   Review of Systems Review of Systems  All other systems reviewed and are negative.    Physical Exam Updated Vital Signs BP (!) 168/59 (BP Location: Right Arm)   Pulse 84   Temp 98.2 F (36.8 C) (Oral)   Resp 18   SpO2 95%   Physical Exam Vitals signs and nursing note reviewed.  Constitutional:      General: He is not in acute distress.    Appearance: He is well-developed.  HENT:     Head: Normocephalic and atraumatic.     Mouth/Throat:     Pharynx: No oropharyngeal exudate.  Eyes:     General: No scleral icterus.       Right eye: No discharge.        Left eye: No discharge.     Conjunctiva/sclera: Conjunctivae normal.     Pupils: Pupils are equal, round, and reactive to light.  Neck:     Musculoskeletal: Normal range of motion and neck supple.     Thyroid: No thyromegaly.     Vascular: No JVD.  Cardiovascular:     Rate and Rhythm: Normal  rate and regular rhythm.     Heart sounds: Normal heart sounds. No murmur. No friction rub. No gallop.   Pulmonary:     Effort: Pulmonary effort is normal. No respiratory distress.     Breath sounds: Normal breath  sounds. No wheezing or rales.  Abdominal:     General: Bowel sounds are normal. There is no distension.     Palpations: Abdomen is soft. There is no mass.     Tenderness: There is no abdominal tenderness.  Musculoskeletal: Normal range of motion.        General: No tenderness.     Right lower leg: Edema present.     Left lower leg: Edema present.     Comments: The patient has significant tenting edema of his bilateral lower extremities which extends proximally all the way to the level of the umbilicus where there is anasarca  Lymphadenopathy:     Cervical: No cervical adenopathy.  Skin:    General: Skin is warm and dry.     Findings: No erythema or rash.  Neurological:     Mental Status: He is alert.     Coordination: Coordination normal.  Psychiatric:        Behavior: Behavior normal.      ED Treatments / Results  Labs (all labs ordered are listed, but only abnormal results are displayed) Labs Reviewed  SARS CORONAVIRUS 2 (TAT 6-24 HRS)  CULTURE, BLOOD (ROUTINE X 2)  CULTURE, BLOOD (ROUTINE X 2)  BRAIN NATRIURETIC PEPTIDE  COMPREHENSIVE METABOLIC PANEL  CBC WITH DIFFERENTIAL/PLATELET  URINALYSIS, ROUTINE W REFLEX MICROSCOPIC  LACTIC ACID, PLASMA  LACTIC ACID, PLASMA  D-DIMER, QUANTITATIVE (NOT AT [facility])  PROCALCITONIN  LACTATE DEHYDROGENASE  FERRITIN  TRIGLYCERIDES  FIBRINOGEN  C-REACTIVE PROTEIN  TROPONIN I (HIGH SENSITIVITY)    EKG EKG Interpretation  Date/Time:  Tuesday September 23 2019 12:20:37 EST Ventricular Rate:  77 PR Interval:  152 QRS Duration: 110 QT Interval:  418 QTC Calculation: 473 R Axis:   -54 Text Interpretation: Normal sinus rhythm Left anterior fascicular block Abnormal ECG since last tracing no significant change Confirmed  by Noemi Chapel [phone]) on 09/23/2019 5:43:20 PM   Radiology Dg Chest 2 View  Result Date: 09/23/2019 CLINICAL DATA:  78 year old male with shortness of breath EXAM: CHEST - 2 VIEW COMPARISON:  Chest radiograph dated 02/23/2013 FINDINGS: There is a small left pleural effusion with left lung base atelectasis or infiltrate. Patchy area of airspace opacity in the right upper lobe most consistent with pneumonia. Clinical correlation and follow-up to resolution recommended. There is no pneumothorax. Stable cardiomegaly. Atherosclerotic calcification of the aorta. Degenerative changes of the spine. No acute osseous pathology. IMPRESSION: 1. Small left pleural effusion with left lung base atelectasis or infiltrate. 2. Patchy area of airspace opacity in the right upper lobe most consistent with pneumonia. Clinical correlation and follow-up to resolution recommended. Electronically Signed   By: Anner Crete M.D.   On: 09/23/2019 13:20    Procedures .Critical Care Performed by: Noemi Chapel, MD Authorized by: Noemi Chapel, MD   Critical care provider statement:    Critical care time (minutes):  35   Critical care time was exclusive of:  Separately billable procedures and treating other patients and teaching time   Critical care was necessary to treat or prevent imminent or life-threatening deterioration of the following conditions:  Respiratory failure   Critical care was time spent personally by me on the following activities:  Blood draw for specimens, development of treatment plan with patient or surrogate, discussions with consultants, evaluation of patient's response to treatment, examination of patient, obtaining history from patient or surrogate, ordering and performing treatments and interventions, ordering and review of laboratory studies, ordering and review of radiographic studies, pulse oximetry, re-evaluation  of patient's condition and review of old charts   (including critical care  time)  Medications Ordered in ED Medications  cefTRIAXone (ROCEPHIN) 1 g in sodium chloride 0.9 % 100 mL IVPB (has no administration in time range)  azithromycin (ZITHROMAX) 500 mg in sodium chloride 0.9 % 250 mL IVPB (has no administration in time range)  furosemide (LASIX) injection 40 mg (40 mg Intravenous Given 09/23/19 2057)     Initial Impression / Assessment and Plan / ED Course  I have reviewed the triage vital signs and the nursing notes.  Pertinent labs & imaging results that were available during my care of the patient were reviewed by me and considered in my medical decision making (see chart for details).        The EKG is not remarkable, his chest x-ray is significantly remarkable for right upper lobe infiltrates, the patient has an elevated left hemithorax which is likely related to an effusion, he has significant edema which appears to be consistent with anasarca.  He has no fever but will need a work-up for possible Covid, consider congestive heart failure, liver disease or other reasons to not severe edema.  He is agreeable to the plan, his vital signs at this point are reassuring thankfully.  Labs confirm that the patient is in fact very low on his total protein and his albumin at 2.6.  His creatinine is 3 which is up from prior lab values and his BNP is 297 suggestive that this is not congestive heart failure.  His troponin is 47 is noted to be high, D-dimer is 2.7 and the lactic acid is 1.0, pro calcitonin is in process, other inflammatory markers are elevated  X-ray shows a possible infiltrate, he was treated for possible pneumonia, unfortunately given the other stressors he will need to be admitted to the hospital to make sure this is not a developing ischemic cause or related to coronavirus.  I consulted with the hospitalist who will admit.  *Very low albumin/protein *NSTEMI (elevated trop) *Acute on chronic renal failuire *Diuresis given *consulted hospitalist -  at 10:15 PM - they will admit  Michael Trevino was evaluated in Emergency Department on 09/23/2019 for the symptoms described in the history of present illness. He was evaluated in the context of the global COVID-19 pandemic, which necessitated consideration that the patient might be at risk for infection with the SARS-CoV-2 virus that causes COVID-19. Institutional protocols and algorithms that pertain to the evaluation of patients at risk for COVID-19 are in a state of rapid change based on information released by regulatory bodies including the CDC and federal and state organizations. These policies and algorithms were followed during the patient's care in the ED.   Final Clinical Impressions(s) / ED Diagnoses   Final diagnoses:  Anasarca  Hypoalbuminemia  Dyspnea on exertion  Lung infiltrate      Noemi Chapel, MD 09/23/19 2222

## 2019-09-23 NOTE — ED Notes (Signed)
Pt sitting on side of bed, states he can breathe better

## 2019-09-24 ENCOUNTER — Inpatient Hospital Stay (HOSPITAL_COMMUNITY): Payer: Medicare Other

## 2019-09-24 DIAGNOSIS — J189 Pneumonia, unspecified organism: Secondary | ICD-10-CM | POA: Diagnosis not present

## 2019-09-24 DIAGNOSIS — I351 Nonrheumatic aortic (valve) insufficiency: Secondary | ICD-10-CM | POA: Diagnosis present

## 2019-09-24 DIAGNOSIS — I10 Essential (primary) hypertension: Secondary | ICD-10-CM

## 2019-09-24 DIAGNOSIS — I130 Hypertensive heart and chronic kidney disease with heart failure and stage 1 through stage 4 chronic kidney disease, or unspecified chronic kidney disease: Secondary | ICD-10-CM | POA: Diagnosis present

## 2019-09-24 DIAGNOSIS — N184 Chronic kidney disease, stage 4 (severe): Secondary | ICD-10-CM | POA: Diagnosis present

## 2019-09-24 DIAGNOSIS — Z79890 Hormone replacement therapy (postmenopausal): Secondary | ICD-10-CM | POA: Diagnosis not present

## 2019-09-24 DIAGNOSIS — I5033 Acute on chronic diastolic (congestive) heart failure: Secondary | ICD-10-CM | POA: Diagnosis not present

## 2019-09-24 DIAGNOSIS — Z7982 Long term (current) use of aspirin: Secondary | ICD-10-CM | POA: Diagnosis not present

## 2019-09-24 DIAGNOSIS — R0602 Shortness of breath: Secondary | ICD-10-CM | POA: Diagnosis not present

## 2019-09-24 DIAGNOSIS — I2510 Atherosclerotic heart disease of native coronary artery without angina pectoris: Secondary | ICD-10-CM | POA: Diagnosis not present

## 2019-09-24 DIAGNOSIS — M1991 Primary osteoarthritis, unspecified site: Secondary | ICD-10-CM | POA: Diagnosis present

## 2019-09-24 DIAGNOSIS — N179 Acute kidney failure, unspecified: Secondary | ICD-10-CM | POA: Diagnosis present

## 2019-09-24 DIAGNOSIS — Z794 Long term (current) use of insulin: Secondary | ICD-10-CM | POA: Diagnosis not present

## 2019-09-24 DIAGNOSIS — Z823 Family history of stroke: Secondary | ICD-10-CM | POA: Diagnosis not present

## 2019-09-24 DIAGNOSIS — E1165 Type 2 diabetes mellitus with hyperglycemia: Secondary | ICD-10-CM | POA: Diagnosis not present

## 2019-09-24 DIAGNOSIS — I252 Old myocardial infarction: Secondary | ICD-10-CM | POA: Diagnosis not present

## 2019-09-24 DIAGNOSIS — Z809 Family history of malignant neoplasm, unspecified: Secondary | ICD-10-CM | POA: Diagnosis not present

## 2019-09-24 DIAGNOSIS — N183 Chronic kidney disease, stage 3 unspecified: Secondary | ICD-10-CM | POA: Diagnosis not present

## 2019-09-24 DIAGNOSIS — E782 Mixed hyperlipidemia: Secondary | ICD-10-CM

## 2019-09-24 DIAGNOSIS — Z841 Family history of disorders of kidney and ureter: Secondary | ICD-10-CM | POA: Diagnosis not present

## 2019-09-24 DIAGNOSIS — Z833 Family history of diabetes mellitus: Secondary | ICD-10-CM | POA: Diagnosis not present

## 2019-09-24 DIAGNOSIS — Z8673 Personal history of transient ischemic attack (TIA), and cerebral infarction without residual deficits: Secondary | ICD-10-CM | POA: Diagnosis not present

## 2019-09-24 DIAGNOSIS — E1122 Type 2 diabetes mellitus with diabetic chronic kidney disease: Secondary | ICD-10-CM

## 2019-09-24 DIAGNOSIS — M1990 Unspecified osteoarthritis, unspecified site: Secondary | ICD-10-CM | POA: Diagnosis not present

## 2019-09-24 DIAGNOSIS — E039 Hypothyroidism, unspecified: Secondary | ICD-10-CM | POA: Diagnosis not present

## 2019-09-24 DIAGNOSIS — Z8249 Family history of ischemic heart disease and other diseases of the circulatory system: Secondary | ICD-10-CM | POA: Diagnosis not present

## 2019-09-24 DIAGNOSIS — I5031 Acute diastolic (congestive) heart failure: Secondary | ICD-10-CM

## 2019-09-24 DIAGNOSIS — I21A1 Myocardial infarction type 2: Secondary | ICD-10-CM | POA: Diagnosis present

## 2019-09-24 DIAGNOSIS — Z811 Family history of alcohol abuse and dependence: Secondary | ICD-10-CM | POA: Diagnosis not present

## 2019-09-24 DIAGNOSIS — E8770 Fluid overload, unspecified: Secondary | ICD-10-CM | POA: Diagnosis present

## 2019-09-24 DIAGNOSIS — Z20828 Contact with and (suspected) exposure to other viral communicable diseases: Secondary | ICD-10-CM | POA: Diagnosis present

## 2019-09-24 DIAGNOSIS — R601 Generalized edema: Secondary | ICD-10-CM | POA: Diagnosis not present

## 2019-09-24 LAB — BASIC METABOLIC PANEL
Anion gap: 8 (ref 5–15)
BUN: 34 mg/dL — ABNORMAL HIGH (ref 8–23)
CO2: 21 mmol/L — ABNORMAL LOW (ref 22–32)
Calcium: 7.1 mg/dL — ABNORMAL LOW (ref 8.9–10.3)
Chloride: 112 mmol/L — ABNORMAL HIGH (ref 98–111)
Creatinine, Ser: 3.13 mg/dL — ABNORMAL HIGH (ref 0.61–1.24)
GFR calc Af Amer: 21 mL/min — ABNORMAL LOW (ref >60)
GFR calc non Af Amer: 18 mL/min — ABNORMAL LOW (ref >60)
Glucose, Bld: 119 mg/dL — ABNORMAL HIGH (ref 70–99)
Potassium: 4 mmol/L (ref 3.5–5.1)
Sodium: 141 mmol/L (ref 135–145)

## 2019-09-24 LAB — CBC
HCT: 32.7 % — ABNORMAL LOW (ref 39.0–52.0)
Hemoglobin: 10.1 g/dL — ABNORMAL LOW (ref 13.0–17.0)
MCH: 28.2 pg (ref 26.0–34.0)
MCHC: 30.9 g/dL (ref 30.0–36.0)
MCV: 91.3 fL (ref 80.0–100.0)
Platelets: 214 10*3/uL (ref 150–400)
RBC: 3.58 MIL/uL — ABNORMAL LOW (ref 4.22–5.81)
RDW: 14.6 % (ref 11.5–15.5)
WBC: 4.4 10*3/uL (ref 4.0–10.5)
nRBC: 0 % (ref 0.0–0.2)

## 2019-09-24 LAB — ECHOCARDIOGRAM COMPLETE

## 2019-09-24 LAB — SARS CORONAVIRUS 2 (TAT 6-24 HRS): SARS Coronavirus 2: NEGATIVE

## 2019-09-24 LAB — TSH: TSH: 4.988 u[IU]/mL — ABNORMAL HIGH (ref 0.350–4.500)

## 2019-09-24 MED ORDER — TRAMADOL HCL 50 MG PO TABS: 50.0000 mg | ORAL_TABLET | Freq: Three times a day (TID) | ORAL | Status: DC | PRN

## 2019-09-24 MED ORDER — SODIUM CHLORIDE 0.9% FLUSH
3.0000 mL | Freq: Two times a day (BID) | INTRAVENOUS | Status: DC
  Administered 2019-09-24 – 2019-09-30 (×12): 3 mL via INTRAVENOUS

## 2019-09-24 MED ORDER — BISACODYL 10 MG RE SUPP: 10.0000 mg | Freq: Every day | RECTAL | Status: DC | PRN

## 2019-09-24 MED ORDER — AMLODIPINE BESYLATE 5 MG PO TABS: 5.0000 mg | ORAL_TABLET | Freq: Every day | ORAL | Status: DC

## 2019-09-24 MED ORDER — METOPROLOL TARTRATE 50 MG PO TABS
100.0000 mg | ORAL_TABLET | Freq: Two times a day (BID) | ORAL | Status: DC
  Administered 2019-09-24 – 2019-09-30 (×13): 100 mg via ORAL
  Filled 2019-09-24 (×13): qty 2

## 2019-09-24 MED ORDER — ONDANSETRON HCL 4 MG PO TABS: 4.0000 mg | ORAL_TABLET | Freq: Four times a day (QID) | ORAL | Status: DC | PRN

## 2019-09-24 MED ORDER — HEPARIN SODIUM (PORCINE) 5000 UNIT/ML IJ SOLN
5000.0000 [IU] | Freq: Three times a day (TID) | INTRAMUSCULAR | Status: DC
  Administered 2019-09-24 – 2019-09-30 (×18): 5000 [IU] via SUBCUTANEOUS
  Filled 2019-09-24 (×18): qty 1

## 2019-09-24 MED ORDER — SODIUM CHLORIDE 0.9 % IV SOLN
1.0000 g | INTRAVENOUS | Status: DC
  Administered 2019-09-24: 1 g via INTRAVENOUS
  Filled 2019-09-24: qty 10

## 2019-09-24 MED ORDER — INSULIN DETEMIR 100 UNIT/ML ~~LOC~~ SOLN
30.0000 [IU] | Freq: Every day | SUBCUTANEOUS | Status: DC
  Filled 2019-09-24: qty 0.3

## 2019-09-24 MED ORDER — ACETAMINOPHEN 325 MG PO TABS: 650.0000 mg | ORAL_TABLET | Freq: Four times a day (QID) | ORAL | Status: DC | PRN

## 2019-09-24 MED ORDER — ONDANSETRON HCL 4 MG/2ML IJ SOLN: 4.0000 mg | Freq: Four times a day (QID) | INTRAMUSCULAR | Status: DC | PRN

## 2019-09-24 MED ORDER — ASPIRIN EC 81 MG PO TBEC
81.0000 mg | DELAYED_RELEASE_TABLET | Freq: Every day | ORAL | Status: DC
  Administered 2019-09-24 – 2019-09-30 (×7): 81 mg via ORAL
  Filled 2019-09-24 (×7): qty 1

## 2019-09-24 MED ORDER — LEVOTHYROXINE SODIUM 75 MCG PO TABS
150.0000 ug | ORAL_TABLET | Freq: Every day | ORAL | Status: DC
  Administered 2019-09-24 – 2019-09-30 (×7): 150 ug via ORAL
  Filled 2019-09-24 (×6): qty 2
  Filled 2019-09-24: qty 3

## 2019-09-24 MED ORDER — DILTIAZEM HCL 25 MG/5ML IV SOLN: 5.0000 mg | Freq: Four times a day (QID) | INTRAVENOUS | Status: DC | PRN

## 2019-09-24 MED ORDER — SODIUM CHLORIDE 0.9% FLUSH: 3.0000 mL | INTRAVENOUS | Status: DC | PRN

## 2019-09-24 MED ORDER — LINAGLIPTIN 5 MG PO TABS
5.0000 mg | ORAL_TABLET | Freq: Every day | ORAL | Status: DC
  Administered 2019-09-24: 5 mg via ORAL
  Filled 2019-09-24 (×3): qty 1

## 2019-09-24 MED ORDER — ATORVASTATIN CALCIUM 40 MG PO TABS
40.0000 mg | ORAL_TABLET | Freq: Every day | ORAL | Status: DC
  Administered 2019-09-24 – 2019-09-29 (×6): 40 mg via ORAL
  Filled 2019-09-24 (×6): qty 1

## 2019-09-24 MED ORDER — FUROSEMIDE 10 MG/ML IJ SOLN
80.0000 mg | Freq: Two times a day (BID) | INTRAMUSCULAR | Status: DC
  Administered 2019-09-24 – 2019-09-29 (×12): 80 mg via INTRAVENOUS
  Filled 2019-09-24 (×12): qty 8

## 2019-09-24 MED ORDER — ACETAMINOPHEN 650 MG RE SUPP: 650.0000 mg | Freq: Four times a day (QID) | RECTAL | Status: DC | PRN

## 2019-09-24 MED ORDER — SODIUM CHLORIDE 0.9 % IV SOLN: 250.0000 mL | INTRAVENOUS | Status: DC | PRN

## 2019-09-24 MED ORDER — VITAMIN C 500 MG PO TABS
500.0000 mg | ORAL_TABLET | Freq: Every day | ORAL | Status: DC
  Administered 2019-09-24 – 2019-09-30 (×7): 500 mg via ORAL
  Filled 2019-09-24 (×9): qty 1

## 2019-09-24 MED ORDER — POLYETHYLENE GLYCOL 3350 17 G PO PACK: 17.0000 g | PACK | Freq: Every day | ORAL | Status: DC | PRN

## 2019-09-24 NOTE — Progress Notes (Addendum)
PROGRESS NOTE    Michael Trevino  [id number] DOB: 1940-12-11 DOA: 09/23/2019 PCP: Dettinger, Fransisca Kaufmann, MD    Brief Narrative:  78 year old male who presented with dyspnea and lower extremity edema.  He does have significant past medical history for hypertension, dyslipidemia, type 2 diabetes mellitus, coronary disease, history of CVA, and hypothyroidism.  He presented with worsening dyspnea, over the last 6 months, but more intense over the last few days.  Dyspnea associated with lower extremity edema and increased abdominal girth.  Positive orthopnea.  On his initial physical examination temperature 98.2, heart rate 84, blood pressure 168/59, oxygen saturation 95% on room air.  His lungs had decreased breath sounds bilaterally, positive Rales, no wheezing, heart S1-S2 present, irregular, abdomen protuberant, 4+ lower extremity edema. Sodium 137, potassium 3.9, chloride 109, bicarb 20, glucose 145, BUN 32, creatinine 3.0, BNP 297, troponin 47.  Chest x-ray with a left pleural effusion, increased hilar vascular congestion, positive infiltrate right upper lobe.  EKG 77 bpm, left axis deviation, left anterior fascicular block, sinus rhythm, no ST segment or T wave changes, poor R wave progression.  Patient was admitted to the hospital working diagnosis of acute on chronic diastolic heart failure complicated by right upper lobe community-acquired pneumonia.    Assessment & Plan:   Active Problems:   Hypothyroidism   CKD (chronic kidney disease) stage 3, GFR 30-59 ml/min   CAD (coronary artery disease)   Mixed hyperlipidemia   Uncontrolled type 2 diabetes mellitus with stage 3 chronic kidney disease (HCC)   Essential hypertension, benign   Fluid overload   1. Acute on chronic diastolic heart failure. Patient continue to be hypervolemic, will continue aggressive diuresis with IV furosemide to target a negative fluid balance, continue blood pressure control. Follow on echocardiogram and strict  in and out's. Troponin elevation due to heart failure, no signs of acute coronary syndrome. Continue metoprolol 100 mg bid.  2. Chest film with pulmonary edema and right upper lobe infiltrate, possible community acquired pneumonia. Will continue diuresis and will follow chest film in am, possible just fluid overload, for now will continue ceftriaxone until infectious process ruled out.   3. AKI on CKD stage 3. Renal function with stable cr at 3,1 with k at 4.0 and serum bicarbonate at 21, continue diuresis for hypervolemia, avoid nephrotoxic medications or hypotension. Follow up renal panel in am,  4. HTN. Continue blood pressure control with amlodipine and metoprolol.  5. T2DM. Fasting glucose this am 119, will hold on long acting insulin for now in the setting of AKI due to risk of hypoglycemia, Will continue glucose control with insulin sliding scale.   6. Dyslipidemia. Continue with statin therapy.   7. Hypothyroid. Continue with levothyroxine.   DVT prophylaxis: enoxaparin   Code Status:  full Family Communication: no family at the bedside  Disposition Plan/ discharge barriers: pending clinical improvement.   There is no height or weight on file to calculate BMI. Malnutrition Type:      Malnutrition Characteristics:      Nutrition Interventions:     RN Pressure Injury Documentation:     Consultants:     Procedures:     Antimicrobials:       Subjective: Patient continue to have dyspnea and edema, no chest pain, no nausea or vomiting, he is out of bed to a chair.   Objective: Vitals:   09/24/19 0100 09/24/19 0630 09/24/19 0700 09/24/19 0730  BP: 139/73 (!) 149/63 (!) 136/52 135/63  Pulse: 90 83 86  90  Resp: (!) 23 16 14 16   Temp:      TempSrc:      SpO2: 98% 99% 97% 98%    Intake/Output Summary (Last 24 hours) at 09/24/2019 1552 Last data filed at 09/24/2019 0115 Gross per 24 hour  Intake 350 ml  Output 675 ml  Net -325 ml   There were no  vitals filed for this visit.  Examination:   General: Not in pain or dyspnea, deconditioned  Neurology: Awake and alert, non focal  E ENT: mild pallor, no icterus, oral mucosa moist Cardiovascular: No JVD. S1-S2 present, rhythmic, no gallops, rubs, or murmurs. No lower extremity edema. Pulmonary: positive breath sounds bilaterally, decreased air movement, no wheezing, rhonchi, bilateral rales. Gastrointestinal. Abdomen with no organomegaly, non tender, no rebound or guarding Skin. No rashes Musculoskeletal: no joint deformities     Data Reviewed: I have personally reviewed following labs and imaging studies  CBC: Recent Labs  Lab 09/23/19 2011 09/24/19 0411  WBC 5.4 4.4  NEUTROABS 3.2  --   HGB 9.9* 10.1*  HCT 31.4* 32.7*  MCV 91.3 91.3  PLT 195 [id number]   Basic Metabolic Panel: Recent Labs  Lab 09/23/19 2011 09/24/19 0411  NA 137 141  K 3.9 4.0  CL 109 112*  CO2 20* 21*  GLUCOSE 145* 119*  BUN 32* 34*  CREATININE 3.02* 3.13*  CALCIUM 6.7* 7.1*   GFR: CrCl cannot be calculated (Unknown ideal weight.). Liver Function Tests: Recent Labs  Lab 09/23/19 2011  AST 19  ALT 16  ALKPHOS 85  BILITOT 0.5  PROT 6.1*  ALBUMIN 2.6*   No results for input(s): LIPASE, AMYLASE in the last 168 hours. No results for input(s): AMMONIA in the last 168 hours. Coagulation Profile: No results for input(s): INR, PROTIME in the last 168 hours. Cardiac Enzymes: No results for input(s): CKTOTAL, CKMB, CKMBINDEX, TROPONINI in the last 168 hours. BNP (last 3 results) No results for input(s): PROBNP in the last 8760 hours. HbA1C: No results for input(s): HGBA1C in the last 72 hours. CBG: No results for input(s): GLUCAP in the last 168 hours. Lipid Profile: Recent Labs    09/23/19 2011  TRIG 173*   Thyroid Function Tests: Recent Labs    09/24/19 0411  TSH 4.988*   Anemia Panel: Recent Labs    09/23/19 2011  FERRITIN 67      Radiology Studies: I have reviewed all  of the imaging during this hospital visit personally     Scheduled Meds: . aspirin EC  81 mg Oral Daily  . atorvastatin  40 mg Oral q1800  . furosemide  80 mg Intravenous Q12H  . heparin  5,000 Units Subcutaneous Q8H  . insulin detemir  30 Units Subcutaneous QHS  . levothyroxine  150 mcg Oral QAC breakfast  . linagliptin  5 mg Oral Daily  . metoprolol tartrate  100 mg Oral BID  . sodium chloride flush  3 mL Intravenous Q12H  . ascorbic acid  500 mg Oral Daily   Continuous Infusions: . sodium chloride       LOS: 0 days        Viviene Thurston Gerome Apley, MD

## 2019-09-24 NOTE — Plan of Care (Signed)

## 2019-09-24 NOTE — H&P (Signed)
History and Physical    Michael Trevino [id number] DOB: 1941-07-05 DOA: 09/23/2019  PCP: Dettinger, Fransisca Kaufmann, MD  Patient coming from: Home  I have personally briefly reviewed patient's old medical records in [place]  Chief Complaint: Shortness of breath, lower extremity edema  HPI: Michael Trevino is a 78 y.o. male with medical history significant of hypertension, hyperlipidemia, diabetes mellitus type 2, coronary artery disease with history of MI, history of CVA, hypothyroidism who presented to the ER with increasing shortness of breath.  Patient states he has been having gradually worsening dyspnea over the last 6 months.  Over the last few days has gotten progressively worse.  He says he has dyspnea with relatively minimal exertion including walking to the bathroom and he has to stop for taking some deep breaths.  He also reports having worsening lower extremity edema with increased swelling but no pain.  He is also had abdominal wall swelling and reports his pants are getting tight.  He has had mild orthopnea but no paroxysmal nocturnal dyspnea. He reports making adequate amount of urine.  He is also occasional palpitations.No fever, chills, chest pain, abdominal pain, nausea, vomiting, dysuria.  No seizures, syncope, focal deficits, headache, dizziness, lightheadedness, diarrhea. ED Course:  Vital Signs reviewed on presentation, significant for temperature 98.2, heart rate 84, blood pressure 168/59, saturation 95% on room air. Labs reviewed, significant for sodium 137, potassium 3.9, bicarb 20, BUN 32, creatinine 3.02, LFTs within normal limits, BNP 297, LDH 208, high-sensitivity troponin 47x2, ferritin 67, CRP 2.3, lactic acid 1.0, procalcitonin is negative, WBC count 5.4, hemoglobin 9.9, hematocrit 31.4, platelets 195, D-dimer is 2.7, fibrinogen is 732, blood cultures have been sent. Imaging personally Reviewed, chest x-ray shows small left pleural effusion with left lung base  atelectasis or infiltrate.  Patchy area of airspace opacity in the right upper lobe most consistent with pneumonia. EKG personally reviewed, shows atrial fibrillation, left anterior fascicular block.  Review of Systems: As per HPI otherwise 10 point review of systems negative.  All other review of systems is negative except the ones noted above in the HPI.  Past Medical History:  Diagnosis Date  . Arthritis   . Diabetes mellitus without complication ([place])   . Heart attack ([place])   . Hyperlipidemia   . Hypertension   . MI (myocardial infarction) ([place]) 2000  . Renal disorder   . Sepsis ([place])   . Sepsis due to Streptococcus, group B ([place]) 02/24/2013  . Stroke ([place])   . Thyroid disease    hypothyroid    Past Surgical History:  Procedure Laterality Date  . TEE WITHOUT CARDIOVERSION N/A 02/26/2013   Procedure: TRANSESOPHAGEAL ECHOCARDIOGRAM (TEE);  Surgeon: Thayer Headings, MD;  Location: [facility] ENDOSCOPY;  Service: Cardiovascular;  Laterality: N/A;     reports that he quit smoking about 34 years ago. His smoking use included cigarettes. He has a 15.00 pack-year smoking history. He has never used smokeless tobacco. He reports that he does not drink alcohol or use drugs.  Allergies  Allergen Reactions  . Zocor [Simvastatin - High Dose] Nausea Only    Unknown    Family History  Problem Relation Age of Onset  . Diabetes Mother   . Hypertension Mother   . Cancer Father   . Stroke Brother   . Alcohol abuse Brother   . Diabetes Brother   . Diabetes Sister   . Diabetes Brother 13       TYPE 1  . Kidney disease Brother 46  DIALYSIS  . Heart attack Brother   . Healthy Daughter   . Healthy Son   . Healthy Daughter   . Healthy Son   . Healthy Son   . Healthy Son    Family history reviewed, noted as above, not pertinent to current presentation.   Prior to Admission medications   Medication Sig Start Date End Date Taking? Authorizing Provider  amLODipine (NORVASC) 10 MG tablet  Take 1 tablet (10 mg total) by mouth daily. 06/26/19   Dettinger, Fransisca Kaufmann, MD  ascorbic acid (VITAMIN C) 500 MG tablet Take 500 mg by mouth daily.    [provider]  aspirin EC 81 MG tablet Take 81 mg by mouth daily.    [provider]  atorvastatin (LIPITOR) 40 MG tablet TAKE 1 TABLET BY MOUTH EVERY DAY 04/11/19   Dettinger, Fransisca Kaufmann, MD  Calcium Carbonate 500 MG CHEW Chew 1 tablet (500 mg total) by mouth 3 (three) times daily as needed. Patient taking differently: Chew 500 mg by mouth daily.  05/01/16   Cherre Robins, PharmD  furosemide (LASIX) 80 MG tablet Take 1 tablet (80 mg total) by mouth 2 (two) times daily. 04/11/19   Dettinger, Fransisca Kaufmann, MD  glimepiride (AMARYL) 2 MG tablet TAKE 1 TABLET (2 MG TOTAL) BY MOUTH DAILY WITH BREAKFAST. 04/11/19   Cassandria Anger, MD  glucose blood test strip Use to check blood glucose once daily.  Dx:  E11.9 07/14/15   Wardell Honour, MD  Insulin Detemir (LEVEMIR FLEXPEN) 100 UNIT/ML Pen Inject 30 Units into the skin at bedtime. 04/11/19   Dettinger, Fransisca Kaufmann, MD  levothyroxine (SYNTHROID) 150 MCG tablet Take 1 tablet (150 mcg total) by mouth daily before breakfast. 04/11/19   Dettinger, Fransisca Kaufmann, MD  linagliptin (TRADJENTA) 5 MG TABS tablet Take 1 tablet (5 mg total) by mouth daily. 04/11/19   Dettinger, Fransisca Kaufmann, MD  metoprolol tartrate (LOPRESSOR) 100 MG tablet Take 1 tablet (100 mg total) by mouth 2 (two) times daily. 06/26/19   Dettinger, Fransisca Kaufmann, MD  olmesartan (BENICAR) 40 MG tablet Take 1 tablet (40 mg total) by mouth daily. 04/11/19   Dettinger, Fransisca Kaufmann, MD    Physical Exam: Vitals:   09/23/19 1213 09/23/19 1719 09/23/19 1930 09/23/19 2207  BP: (!) 153/59 (!) 168/59 (!) 161/65 (!) 163/73  Pulse: 80 84 84 (!) 102  Resp: 18 18 (!) 24 (!) 24  Temp: 98.2 F (36.8 C) 98.2 F (36.8 C)    TempSrc: Oral Oral    SpO2: 98% 95% 98% 95%    Constitutional: NAD, calm, comfortable Vitals:   09/23/19 1213 09/23/19 1719 09/23/19 1930  09/23/19 2207  BP: (!) 153/59 (!) 168/59 (!) 161/65 (!) 163/73  Pulse: 80 84 84 (!) 102  Resp: 18 18 (!) 24 (!) 24  Temp: 98.2 F (36.8 C) 98.2 F (36.8 C)    TempSrc: Oral Oral    SpO2: 98% 95% 98% 95%   Eyes: PERRL, lids and conjunctivae normal ENMT: Mucous membranes are moist. Posterior pharynx clear of any exudate or lesions.Normal dentition.  Neck: normal, supple, no masses, no thyromegaly Respiratory: Decreased air entry at both bases, crepitations on right mid zone, normal respiratory effort. No accessory muscle use.  Cardiovascular: Irregular rhythm, mild tachycardia, bilateral 4+ pedal edema with abdominal wall edema, Abdomen: no tenderness, no masses palpated. No hepatosplenomegaly. Bowel sounds positive.  Abdominal wall edema noted Musculoskeletal: no clubbing / cyanosis. No joint deformity upper and lower extremities. Good ROM,  no contractures. Normal muscle tone.  Skin: no rashes, lesions, ulcers. No induration Neurologic: CN 2-12 grossly intact. Sensation intact, DTR normal. Strength 5/5 in all 4.  Psychiatric: Normal judgment and insight. Alert and oriented x 3. Normal mood.    Decubitus Ulcers: Not present on admission Catheters and tubes: None   Labs on Admission: I have personally reviewed following labs and imaging studies  CBC: Recent Labs  Lab 09/23/19 2011  WBC 5.4  NEUTROABS 3.2  HGB 9.9*  HCT 31.4*  MCV 91.3  PLT 0000000   Basic Metabolic Panel: Recent Labs  Lab 09/23/19 2011  NA 137  K 3.9  CL 109  CO2 20*  GLUCOSE 145*  BUN 32*  CREATININE 3.02*  CALCIUM 6.7*   GFR: CrCl cannot be calculated (Unknown ideal weight.). Liver Function Tests: Recent Labs  Lab 09/23/19 2011  AST 19  ALT 16  ALKPHOS 85  BILITOT 0.5  PROT 6.1*  ALBUMIN 2.6*   No results for input(s): LIPASE, AMYLASE in the last 168 hours. No results for input(s): AMMONIA in the last 168 hours. Coagulation Profile: No results for input(s): INR, PROTIME in the last 168  hours. Cardiac Enzymes: No results for input(s): CKTOTAL, CKMB, CKMBINDEX, TROPONINI in the last 168 hours. BNP (last 3 results) No results for input(s): PROBNP in the last 8760 hours. HbA1C: No results for input(s): HGBA1C in the last 72 hours. CBG: No results for input(s): GLUCAP in the last 168 hours. Lipid Profile: Recent Labs    09/23/19 2011  TRIG 173*   Thyroid Function Tests: No results for input(s): TSH, T4TOTAL, FREET4, T3FREE, THYROIDAB in the last 72 hours. Anemia Panel: Recent Labs    09/23/19 2011  FERRITIN 67   Urine analysis:    Component Value Date/Time   COLORURINE YELLOW 02/23/2013 1136   APPEARANCEUR CLEAR 02/23/2013 1136   LABSPEC 1.016 02/23/2013 1136   PHURINE 6.5 02/23/2013 1136   GLUCOSEU 100 (A) 02/23/2013 1136   HGBUR MODERATE (A) 02/23/2013 1136   BILIRUBINUR NEGATIVE 02/23/2013 1136   KETONESUR 15 (A) 02/23/2013 1136   PROTEINUR >300 (A) 02/23/2013 1136   UROBILINOGEN 1.0 02/23/2013 1136   NITRITE NEGATIVE 02/23/2013 1136   LEUKOCYTESUR NEGATIVE 02/23/2013 1136    Radiological Exams on Admission: Dg Chest 2 View  Result Date: 09/23/2019 CLINICAL DATA:  78 year old male with shortness of breath EXAM: CHEST - 2 VIEW COMPARISON:  Chest radiograph dated 02/23/2013 FINDINGS: There is a small left pleural effusion with left lung base atelectasis or infiltrate. Patchy area of airspace opacity in the right upper lobe most consistent with pneumonia. Clinical correlation and follow-up to resolution recommended. There is no pneumothorax. Stable cardiomegaly. Atherosclerotic calcification of the aorta. Degenerative changes of the spine. No acute osseous pathology. IMPRESSION: 1. Small left pleural effusion with left lung base atelectasis or infiltrate. 2. Patchy area of airspace opacity in the right upper lobe most consistent with pneumonia. Clinical correlation and follow-up to resolution recommended. Electronically Signed   By: Anner Crete M.D.    On: 09/23/2019 13:20      Assessment/Plan Active Problems:   Hypothyroidism   CKD (chronic kidney disease) stage 3, GFR 30-59 ml/min   CAD (coronary artery disease)   Mixed hyperlipidemia   Uncontrolled type 2 diabetes mellitus with stage 3 chronic kidney disease (HCC)   Essential hypertension, benign   Fluid overload     Principal Problem: Fluid overload possibly secondary to acute on chronic congestive heart failure with preserved ejection fraction: Patient  presented with worsening fluid overload with increased bilateral lower extremity edema, abdominal wall edema, shortness of breath.  Noted to have a BNP of 297 on presentation.  High-sensitivity troponin is slightly elevated at 47x2. Most recent echocardiogram on 07/18/2018 showed severe LVH, EF of 55 to 123456, diastolic dysfunction, moderate aortic stenosis, valve area of 1.26. Plan: Telemetry monitoring Serial troponins IV Lasix Repeat echocardiogram in a.m. Cardiology consult in a.m., order placed for Dr. Harl Bowie from cardiology.  Will need to be called in a.m.  Other Active Problems: Coronary artery disease: Apparently history of MI 20 years ago.  Denies any stent placement. Continue aspirin  Elevated troponins: High sensitive troponin is at 47x2.  EKG did not show any acute ischemia.  Appears to be troponin leak secondary to demand ischemia type II NSTEMI. We will monitor serial troponins  Abnormal EKG: Patient's initial EKG on presentation was noted to be normal sinus rhythm.  Repeat EKG showed some irregularity and was reported as atrial fibrillation however I do see clear P waves with some probable premature beats.  Will monitor telemetry and repeat EKG in a.m.  Moderate aortic stenosis: Most recent echocardiogram in September 2019 showed moderate aortic stenosis with mean gradient of 20, valve area 1.26. We will repeat echocardiogram.  Cardiology consult in a.m.  Acute on chronic renal failure with baseline CKD  stage III: Patient presented with a creatinine of 3.02, baseline appears to be around 2.4-2.6.  Most recent creatinine was 2.6 on 07/11/2019.  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  Currently with worsened renal failure in the presence of fluid overload and third spacing.  Patient does make adequate amount of urine at home.  Has mild normal anion gap metabolic acidosis. Is on Lasix 80 mg twice daily at home.  Will place on IV Lasix and monitor input output, renal function closely.  Hypertension: Continue amlodipine, metoprolol  Hyperlipidemia: Continue atorvastatin  Diabetes mellitus type 2: Most recent A1c was 8.6 on 08/26/2018. Hold oral hypoglycemics Continue home dose of Levemir Sliding scale insulin coverage with fingerstick monitoring  Hypothyroidism: Continue levothyroxine  DVT prophylaxis: Heparin Code Status:  Full code Family Communication: N/A  Disposition Plan: Admitted as inpatient for diuresis, plan for echo, expect 3 day stay Consults called: Cardiology consult Dr. Harl Bowie has been placed.  Will need to be called in a.m. Admission status: Inpatient admission   [facility] Ginette Otto MD [facility]  If 7PM-7AM, please contact night-coverage   09/24/2019, 2:05 AM

## 2019-09-24 NOTE — ED Notes (Signed)
Pt sitting on side of bed.

## 2019-09-24 NOTE — ED Notes (Signed)
Given dinner tray

## 2019-09-24 NOTE — Progress Notes (Signed)
*  PRELIMINARY RESULTS* Echocardiogram 2D Echocardiogram has been performed.  Leavy Cella 09/24/2019, 3:21 PM

## 2019-09-24 NOTE — ED Notes (Signed)
Given breakfast tray. 

## 2019-09-25 ENCOUNTER — Inpatient Hospital Stay (HOSPITAL_COMMUNITY): Payer: Medicare Other

## 2019-09-25 LAB — CBC WITH DIFFERENTIAL/PLATELET
Abs Immature Granulocytes: 0 10*3/uL (ref 0.00–0.07)
Basophils Absolute: 0 10*3/uL (ref 0.0–0.1)
Basophils Relative: 0 %
Eosinophils Absolute: 0.1 10*3/uL (ref 0.0–0.5)
Eosinophils Relative: 2 %
HCT: 31.8 % — ABNORMAL LOW (ref 39.0–52.0)
Hemoglobin: 10 g/dL — ABNORMAL LOW (ref 13.0–17.0)
Immature Granulocytes: 0 %
Lymphocytes Relative: 45 %
Lymphs Abs: 1.8 10*3/uL (ref 0.7–4.0)
MCH: 28.7 pg (ref 26.0–34.0)
MCHC: 31.4 g/dL (ref 30.0–36.0)
MCV: 91.4 fL (ref 80.0–100.0)
Monocytes Absolute: 0.4 10*3/uL (ref 0.1–1.0)
Monocytes Relative: 9 %
Neutro Abs: 1.8 10*3/uL (ref 1.7–7.7)
Neutrophils Relative %: 44 %
Platelets: 203 10*3/uL (ref 150–400)
RBC: 3.48 MIL/uL — ABNORMAL LOW (ref 4.22–5.81)
RDW: 14.6 % (ref 11.5–15.5)
WBC: 4.1 10*3/uL (ref 4.0–10.5)
nRBC: 0 % (ref 0.0–0.2)

## 2019-09-25 LAB — BASIC METABOLIC PANEL
Anion gap: 11 (ref 5–15)
BUN: 38 mg/dL — ABNORMAL HIGH (ref 8–23)
CO2: 21 mmol/L — ABNORMAL LOW (ref 22–32)
Calcium: 7.3 mg/dL — ABNORMAL LOW (ref 8.9–10.3)
Chloride: 109 mmol/L (ref 98–111)
Creatinine, Ser: 3.27 mg/dL — ABNORMAL HIGH (ref 0.61–1.24)
GFR calc Af Amer: 20 mL/min — ABNORMAL LOW (ref >60)
GFR calc non Af Amer: 17 mL/min — ABNORMAL LOW (ref >60)
Glucose, Bld: 104 mg/dL — ABNORMAL HIGH (ref 70–99)
Potassium: 4.3 mmol/L (ref 3.5–5.1)
Sodium: 141 mmol/L (ref 135–145)

## 2019-09-25 NOTE — Progress Notes (Signed)
PROGRESS NOTE    Michael Trevino  [id number] DOB: July 20, 1941 DOA: 09/23/2019 PCP: Dettinger, Fransisca Kaufmann, MD    Brief Narrative:  78 year old male who presented with dyspnea and lower extremity edema.  He does have significant past medical history for hypertension, dyslipidemia, type 2 diabetes mellitus, coronary disease, history of CVA, and hypothyroidism.  He presented with worsening dyspnea, over the last 6 months, but more intense over the last few days.  Dyspnea associated with lower extremity edema and increased abdominal girth.  Positive orthopnea.  On his initial physical examination temperature 98.2, heart rate 84, blood pressure 168/59, oxygen saturation 95% on room air.  His lungs had decreased breath sounds bilaterally, positive Rales, no wheezing, heart S1-S2 present, irregular, abdomen protuberant, 4+ lower extremity edema. Sodium 137, potassium 3.9, chloride 109, bicarb 20, glucose 145, BUN 32, creatinine 3.0, BNP 297, troponin 47.  Chest x-ray with a left pleural effusion, increased hilar vascular congestion, positive infiltrate right upper lobe.  EKG 77 bpm, left axis deviation, left anterior fascicular block, sinus rhythm, no ST segment or T wave changes, poor R wave progression.  Patient was admitted to the hospital working diagnosis of acute on chronic diastolic heart failure complicated by right upper lobe community-acquired pneumonia.    Assessment & Plan:   Active Problems:   Hypothyroidism   CKD (chronic kidney disease) stage 3, GFR 30-59 ml/min   CAD (coronary artery disease)   Mixed hyperlipidemia   Uncontrolled type 2 diabetes mellitus with stage 3 chronic kidney disease (HCC)   Essential hypertension, benign   Fluid overload   1. Acute on chronic diastolic heart failure. Patient continue to be hypervolemic, will continue aggressive diuresis with IV furosemide to target a negative fluid balance, continue blood pressure control.  Echocardiogram shows normal ejection  fraction with severe left ventricular hypertrophy.  Troponin elevation due to heart failure, no signs of acute coronary syndrome. Continue metoprolol 100 mg bid.  2. Chest film with pulmonary edema and right upper lobe infiltrate, possible community acquired pneumonia.  I suspect this is related more to heart failure than true pneumonia.  Will discontinue further antibiotics and observe.  3. AKI on CKD stage 4.  Baseline creatinine approximately 2.5.  On admission, creatinine noted to be elevated at 3.0.  This is slowly trending up with diuresis and is currently at 3.2. Continue diuresis for hypervolemia, avoid nephrotoxic medications or hypotension. Follow up renal panel in am  4. HTN. Continue blood pressure control with amlodipine and metoprolol.  5. T2DM. Fasting glucose this am 104, will hold on long acting insulin for now in the setting of AKI due to risk of hypoglycemia, Will continue glucose control with insulin sliding scale.   6. Dyslipidemia. Continue with statin therapy.   7. Hypothyroid. Continue with levothyroxine.   DVT prophylaxis: enoxaparin   Code Status:  full Family Communication: no family at the bedside  Disposition Plan/ discharge barriers: pending clinical improvement.   Body mass index is 38.53 kg/m. Malnutrition Type:      Malnutrition Characteristics:      Nutrition Interventions:     RN Pressure Injury Documentation:     Consultants:     Procedures:     Antimicrobials:       Subjective: Feeling better today.  Still short of breath and not back to baseline.  Becomes short of breath on exertion.  Feels that lower extremity edema is improving.  No chest pain.  Objective: Vitals:   09/24/19 2135 09/25/19 0505 09/25/19 1318  09/25/19 2104  BP: (!) 162/57 (!) 162/52 (!) 152/54   Pulse: 90 71 71   Resp: 18 16 17    Temp: 98.2 F (36.8 C) 98.4 F (36.9 C) 98.3 F (36.8 C)   TempSrc: Oral Oral Oral   SpO2:  96% 100% 94%  Weight:   118.3 kg    Height:  5\' 9"  (1.753 m)      Intake/Output Summary (Last 24 hours) at 09/25/2019 2118 Last data filed at 09/25/2019 2000 Gross per 24 hour  Intake 870 ml  Output 2125 ml  Net -1255 ml   Filed Weights   09/25/19 0505  Weight: 118.3 kg    Examination:   General exam: Alert, awake, oriented x 3 Respiratory system: Crackles at bases. Respiratory effort normal. Cardiovascular system:RRR. No murmurs, rubs, gallops. Gastrointestinal system: Abdomen is nondistended, soft and nontender. No organomegaly or masses felt. Normal bowel sounds heard. Central nervous system: Alert and oriented. No focal neurological deficits. Extremities: 1-2+ edema bilaterally Skin: No rashes, lesions or ulcers Psychiatry: Judgement and insight appear normal. Mood & affect appropriate.       Data Reviewed: I have personally reviewed following labs and imaging studies  CBC: Recent Labs  Lab 09/23/19 2011 09/24/19 0411 09/25/19 0453  WBC 5.4 4.4 4.1  NEUTROABS 3.2  --  1.8  HGB 9.9* 10.1* 10.0*  HCT 31.4* 32.7* 31.8*  MCV 91.3 91.3 91.4  PLT 195 214 123456   Basic Metabolic Panel: Recent Labs  Lab 09/23/19 2011 09/24/19 0411 09/25/19 0453  NA 137 141 141  K 3.9 4.0 4.3  CL 109 112* 109  CO2 20* 21* 21*  GLUCOSE 145* 119* 104*  BUN 32* 34* 38*  CREATININE 3.02* 3.13* 3.27*  CALCIUM 6.7* 7.1* 7.3*   GFR: Estimated Creatinine Clearance: 23.6 mL/min (A) (by C-G formula based on SCr of 3.27 mg/dL (H)). Liver Function Tests: Recent Labs  Lab 09/23/19 2011  AST 19  ALT 16  ALKPHOS 85  BILITOT 0.5  PROT 6.1*  ALBUMIN 2.6*   No results for input(s): LIPASE, AMYLASE in the last 168 hours. No results for input(s): AMMONIA in the last 168 hours. Coagulation Profile: No results for input(s): INR, PROTIME in the last 168 hours. Cardiac Enzymes: No results for input(s): CKTOTAL, CKMB, CKMBINDEX, TROPONINI in the last 168 hours. BNP (last 3 results) No results for input(s):  PROBNP in the last 8760 hours. HbA1C: No results for input(s): HGBA1C in the last 72 hours. CBG: No results for input(s): GLUCAP in the last 168 hours. Lipid Profile: Recent Labs    09/23/19 2011  TRIG 173*   Thyroid Function Tests: Recent Labs    09/24/19 0411  TSH 4.988*   Anemia Panel: Recent Labs    09/23/19 2011  FERRITIN 67      Radiology Studies: I have reviewed all of the imaging during this hospital visit personally     Scheduled Meds: . aspirin EC  81 mg Oral Daily  . atorvastatin  40 mg Oral q1800  . furosemide  80 mg Intravenous Q12H  . heparin  5,000 Units Subcutaneous Q8H  . levothyroxine  150 mcg Oral QAC breakfast  . metoprolol tartrate  100 mg Oral BID  . sodium chloride flush  3 mL Intravenous Q12H  . ascorbic acid  500 mg Oral Daily   Continuous Infusions: . sodium chloride       LOS: 1 day        Kathie Dike, MD

## 2019-09-26 ENCOUNTER — Ambulatory Visit: Payer: Self-pay | Admitting: Family Medicine

## 2019-09-26 LAB — URINALYSIS, ROUTINE W REFLEX MICROSCOPIC
Bilirubin Urine: NEGATIVE
Glucose, UA: 50 mg/dL — AB
Hgb urine dipstick: NEGATIVE
Ketones, ur: NEGATIVE mg/dL
Leukocytes,Ua: NEGATIVE
Nitrite: NEGATIVE
Protein, ur: >=300 mg/dL — AB
Specific Gravity, Urine: 1.014 (ref 1.005–1.030)
pH: 5 (ref 5.0–8.0)

## 2019-09-26 LAB — BASIC METABOLIC PANEL
Anion gap: 10 (ref 5–15)
BUN: 42 mg/dL — ABNORMAL HIGH (ref 8–23)
CO2: 22 mmol/L (ref 22–32)
Calcium: 7.4 mg/dL — ABNORMAL LOW (ref 8.9–10.3)
Chloride: 109 mmol/L (ref 98–111)
Creatinine, Ser: 3.26 mg/dL — ABNORMAL HIGH (ref 0.61–1.24)
GFR calc Af Amer: 20 mL/min — ABNORMAL LOW (ref >60)
GFR calc non Af Amer: 17 mL/min — ABNORMAL LOW (ref >60)
Glucose, Bld: 95 mg/dL (ref 70–99)
Potassium: 4.3 mmol/L (ref 3.5–5.1)
Sodium: 141 mmol/L (ref 135–145)

## 2019-09-26 NOTE — Care Management Important Message (Signed)
Important Message  Patient Details  Name: Michael Trevino MRN: [id number] Date of Birth: 1941/09/23   Medicare Important Message Given:  Yes     Tommy Medal 09/26/2019, 1:49 PM

## 2019-09-26 NOTE — TOC Progression Note (Signed)
Transition of Care [facility]) - Progression Note    Patient Details  Name: Sayyid Trnka MRN: [id number] Date of Birth: [birth year]  [facility] CM/SW Contact  Boneta Lucks, RN Phone Number: 09/26/2019, 3:56 PM  Clinical Narrative:   Patient admitted for fluid overload.  MD recommending home health for RN at discharge.  Patient is diuresing, possible weekend discharge. Lives at home with his wife. She is agreeable with recommendation. Choices given, Called Romualdo Bolk with [place] for RN. TOC to follow.   Expected Discharge Plan: [place] Barriers to Discharge: Continued Medical Work up  Expected Discharge Plan and Services Expected Discharge Plan: [place] arrangements for the past 2 months: [place]: PT [place]: [place] ([place]) Date [place]: 09/26/19 Time [place]: [place] Representative spoke with at [place]: Romualdo Bolk   Social Determinants of Health ([place]) Interventions    Readmission Risk Interventions No flowsheet data found.

## 2019-09-26 NOTE — TOC Progression Note (Signed)
Home Health Care Choices:  [place] [phone]  [place] my Favorites Quality of Patient Care Rating 4  out of 5 stars Patient Survey Summary Rating 5 out of 5 stars [facility] [phone]  [place] my Favorites Quality of Patient Care Rating 3 out of 5 stars Patient Survey Summary Rating 5 out of [place] [phone]  [place] my Favorites Quality of Patient Care Rating 2  out of 5 stars Patient Survey Summary Rating 4 out of [place] [phone]  [place] my Favorites Quality of Patient Care Rating 4  out of 5 stars Patient Survey Summary Rating 4 out of 5 stars [place] [phone]  Add [facility] my Favorites Quality of Patient Care Rating 4  out of 5 stars Patient Survey Summary Rating 5 out of 5 stars [place] [phone]  [place] my Favorites Quality of Patient Care Rating 4 out of 5 stars Patient Survey Summary Rating 4 out of 5 stars [place] [phone]  [place] my Favorites Quality of Patient Care Rating 4 out of 5 stars Patient Survey Summary Rating 4 out of 5 stars [place] AGE [phone]  [place] my Favorites Quality of Patient Care Rating 2  out of 5 stars Patient Survey Summary Rating 5 out of 5 stars [facility] [place] [phone]  Add [facility] [place] my Favorites Quality of Patient Care Rating 3  out of 5 stars Patient Survey Summary Rating 4 out of 5 stars [place] [phone]  [place] my Favorites Quality of Patient Care Rating 3 out of 5 stars Patient Survey Summary Rating 4 out of 5  stars [facility] [phone]  Add [facility] my Favorites Quality of Patient Care Rating 4  out of 5 stars Patient Survey Summary Rating 3 out of [place] [phone]  [place] my Favorites Quality of Patient Care Rating 4  out of 5 stars Patient Survey Summary Rating

## 2019-09-26 NOTE — Progress Notes (Signed)
PROGRESS NOTE    Michael Trevino  [id number] DOB: 1941/04/28 DOA: 09/23/2019 PCP: Dettinger, Fransisca Kaufmann, MD    Brief Narrative:  78 year old male who presented with dyspnea and lower extremity edema.  He does have significant past medical history for hypertension, dyslipidemia, type 2 diabetes mellitus, coronary disease, history of CVA, and hypothyroidism.  He presented with worsening dyspnea, over the last 6 months, but more intense over the last few days.  Dyspnea associated with lower extremity edema and increased abdominal girth.  Positive orthopnea.  On his initial physical examination temperature 98.2, heart rate 84, blood pressure 168/59, oxygen saturation 95% on room air.  His lungs had decreased breath sounds bilaterally, positive Rales, no wheezing, heart S1-S2 present, irregular, abdomen protuberant, 4+ lower extremity edema. Sodium 137, potassium 3.9, chloride 109, bicarb 20, glucose 145, BUN 32, creatinine 3.0, BNP 297, troponin 47.  Chest x-ray with a left pleural effusion, increased hilar vascular congestion, positive infiltrate right upper lobe.  EKG 77 bpm, left axis deviation, left anterior fascicular block, sinus rhythm, no ST segment or T wave changes, poor R wave progression.  Patient was admitted to the hospital working diagnosis of acute on chronic diastolic heart failure complicated by right upper lobe community-acquired pneumonia.    Assessment & Plan:   Active Problems:   Hypothyroidism   CKD (chronic kidney disease) stage 3, GFR 30-59 ml/min   CAD (coronary artery disease)   Mixed hyperlipidemia   Uncontrolled type 2 diabetes mellitus with stage 3 chronic kidney disease (HCC)   Essential hypertension, benign   Fluid overload   1. Acute on chronic diastolic heart failure. Patient continues to be hypervolemic, will continue aggressive diuresis with IV furosemide to target a negative fluid balance, continue blood pressure control.  Net volume status is -2.9 L.   Echocardiogram shows normal ejection fraction with severe left ventricular hypertrophy.  Troponin elevation due to heart failure, no signs of acute coronary syndrome. Continue metoprolol 100 mg bid.  2. Chest film with pulmonary edema and right upper lobe infiltrate, possible community acquired pneumonia.  I suspect this is related more to heart failure than true pneumonia.  Will discontinue further antibiotics and observe.  3. AKI on CKD stage 4.  Baseline creatinine approximately 2.5.  On admission, creatinine noted to be elevated at 3.0.  This is slowly trending up with diuresis and is currently at 3.2. Continue diuresis for hypervolemia, avoid nephrotoxic medications or hypotension. Follow up renal panel in am  4. HTN. Continue blood pressure control with amlodipine and metoprolol.  5. T2DM. Fasting glucose this am 95, will hold on long acting insulin for now in the setting of AKI due to risk of hypoglycemia, Will continue glucose control with insulin sliding scale.   6. Dyslipidemia. Continue with statin therapy.   7. Hypothyroid. Continue with levothyroxine.   DVT prophylaxis: enoxaparin   Code Status:  full Family Communication: no family at the bedside  Disposition Plan/ discharge barriers: pending clinical improvement.   Body mass index is 38.19 kg/m. Malnutrition Type:      Malnutrition Characteristics:      Nutrition Interventions:     RN Pressure Injury Documentation:     Consultants:     Procedures:     Antimicrobials:       Subjective: Still has some shortness of breath on exertion, but overall is improving.  Continues to have significant lower extremity edema and legs feel heavy.  Objective: Vitals:   09/25/19 2124 09/26/19 0615 09/26/19 0700 09/26/19  1342  BP: (!) 179/58 (!) 153/52  (!) 161/55  Pulse: 73 66  70  Resp:  18  16  Temp:  97.7 F (36.5 C)  97.9 F (36.6 C)  TempSrc:  Oral  Oral  SpO2:  98%  99%  Weight:   117.3 kg    Height:        Intake/Output Summary (Last 24 hours) at 09/26/2019 1940 Last data filed at 09/26/2019 1050 Gross per 24 hour  Intake -  Output 1925 ml  Net -1925 ml   Filed Weights   09/25/19 0505 09/26/19 0700  Weight: 118.3 kg 117.3 kg    Examination:   General exam: Alert, awake, oriented x 3 Respiratory system: Crackles at bases. Respiratory effort normal. Cardiovascular system:RRR. No murmurs, rubs, gallops. Gastrointestinal system: Abdomen is nondistended, soft and nontender. No organomegaly or masses felt. Normal bowel sounds heard. Central nervous system: Alert and oriented. No focal neurological deficits. Extremities: 1-2+ edema bilaterally Skin: No rashes, lesions or ulcers Psychiatry: Judgement and insight appear normal. Mood & affect appropriate.        Data Reviewed: I have personally reviewed following labs and imaging studies  CBC: Recent Labs  Lab 09/23/19 2011 09/24/19 0411 09/25/19 0453  WBC 5.4 4.4 4.1  NEUTROABS 3.2  --  1.8  HGB 9.9* 10.1* 10.0*  HCT 31.4* 32.7* 31.8*  MCV 91.3 91.3 91.4  PLT 195 214 [id number]   Basic Metabolic Panel: Recent Labs  Lab 09/23/19 2011 09/24/19 0411 09/25/19 0453 09/26/19 0431  NA 137 141 141 141  K 3.9 4.0 4.3 4.3  CL 109 112* 109 109  CO2 20* 21* 21* 22  GLUCOSE 145* 119* 104* 95  BUN 32* 34* 38* 42*  CREATININE 3.02* 3.13* 3.27* 3.26*  CALCIUM 6.7* 7.1* 7.3* 7.4*   GFR: Estimated Creatinine Clearance: 23.6 mL/min (A) (by C-G formula based on SCr of 3.26 mg/dL (H)). Liver Function Tests: Recent Labs  Lab 09/23/19 2011  AST 19  ALT 16  ALKPHOS 85  BILITOT 0.5  PROT 6.1*  ALBUMIN 2.6*   No results for input(s): LIPASE, AMYLASE in the last 168 hours. No results for input(s): AMMONIA in the last 168 hours. Coagulation Profile: No results for input(s): INR, PROTIME in the last 168 hours. Cardiac Enzymes: No results for input(s): CKTOTAL, CKMB, CKMBINDEX, TROPONINI in the last 168 hours. BNP  (last 3 results) No results for input(s): PROBNP in the last 8760 hours. HbA1C: No results for input(s): HGBA1C in the last 72 hours. CBG: No results for input(s): GLUCAP in the last 168 hours. Lipid Profile: Recent Labs    09/23/19 2011  TRIG 173*   Thyroid Function Tests: Recent Labs    09/24/19 0411  TSH 4.988*   Anemia Panel: Recent Labs    09/23/19 2011  FERRITIN 67      Radiology Studies: I have reviewed all of the imaging during this hospital visit personally     Scheduled Meds: . aspirin EC  81 mg Oral Daily  . atorvastatin  40 mg Oral q1800  . furosemide  80 mg Intravenous Q12H  . heparin  5,000 Units Subcutaneous Q8H  . levothyroxine  150 mcg Oral QAC breakfast  . metoprolol tartrate  100 mg Oral BID  . sodium chloride flush  3 mL Intravenous Q12H  . ascorbic acid  500 mg Oral Daily   Continuous Infusions: . sodium chloride       LOS: 2 days  Kathie Dike, MD

## 2019-09-27 LAB — BASIC METABOLIC PANEL
Anion gap: 11 (ref 5–15)
BUN: 41 mg/dL — ABNORMAL HIGH (ref 8–23)
CO2: 23 mmol/L (ref 22–32)
Calcium: 7.6 mg/dL — ABNORMAL LOW (ref 8.9–10.3)
Chloride: 105 mmol/L (ref 98–111)
Creatinine, Ser: 3.05 mg/dL — ABNORMAL HIGH (ref 0.61–1.24)
GFR calc Af Amer: 22 mL/min — ABNORMAL LOW (ref >60)
GFR calc non Af Amer: 19 mL/min — ABNORMAL LOW (ref >60)
Glucose, Bld: 107 mg/dL — ABNORMAL HIGH (ref 70–99)
Potassium: 4.2 mmol/L (ref 3.5–5.1)
Sodium: 139 mmol/L (ref 135–145)

## 2019-09-27 LAB — GLUCOSE, CAPILLARY
Glucose-Capillary: 153 mg/dL — ABNORMAL HIGH (ref 70–99)
Glucose-Capillary: 102 mg/dL — ABNORMAL HIGH (ref 70–99)

## 2019-09-27 MED ORDER — INSULIN ASPART 100 UNIT/ML ~~LOC~~ SOLN
0.0000 [IU] | Freq: Three times a day (TID) | SUBCUTANEOUS | Status: DC
  Administered 2019-09-28 – 2019-09-29 (×2): 2 [IU] via SUBCUTANEOUS
  Administered 2019-09-29: 17:00:00 1 [IU] via SUBCUTANEOUS

## 2019-09-27 NOTE — Progress Notes (Signed)
PROGRESS NOTE    Michael Trevino  [id number] DOB: 02-Aug-1941 DOA: 09/23/2019 PCP: Dettinger, Fransisca Kaufmann, MD    Brief Narrative:  78 year old male who presented with dyspnea and lower extremity edema.  He does have significant past medical history for hypertension, dyslipidemia, type 2 diabetes mellitus, coronary disease, history of CVA, and hypothyroidism.  He presented with worsening dyspnea, over the last 6 months, but more intense over the last few days.  Dyspnea associated with lower extremity edema and increased abdominal girth.  Positive orthopnea.  On his initial physical examination temperature 98.2, heart rate 84, blood pressure 168/59, oxygen saturation 95% on room air.  His lungs had decreased breath sounds bilaterally, positive Rales, no wheezing, heart S1-S2 present, irregular, abdomen protuberant, 4+ lower extremity edema. Sodium 137, potassium 3.9, chloride 109, bicarb 20, glucose 145, BUN 32, creatinine 3.0, BNP 297, troponin 47.  Chest x-ray with a left pleural effusion, increased hilar vascular congestion, positive infiltrate right upper lobe.  EKG 77 bpm, left axis deviation, left anterior fascicular block, sinus rhythm, no ST segment or T wave changes, poor R wave progression.  Patient was admitted to the hospital working diagnosis of acute on chronic diastolic heart failure complicated by right upper lobe community-acquired pneumonia.    Assessment & Plan:   Active Problems:   Hypothyroidism   CKD (chronic kidney disease) stage 3, GFR 30-59 ml/min   CAD (coronary artery disease)   Mixed hyperlipidemia   Uncontrolled type 2 diabetes mellitus with stage 3 chronic kidney disease (HCC)   Essential hypertension, benign   Fluid overload   1. Acute on chronic diastolic heart failure. Patient continues to be hypervolemic, will continue aggressive diuresis with IV furosemide to target a negative fluid balance, continue blood pressure control. His weight is down approximately 5.5  pounds since admission.  Echocardiogram shows normal ejection fraction with severe left ventricular hypertrophy.  Troponin elevation due to heart failure, no signs of acute coronary syndrome. Continue metoprolol 100 mg bid.  2. Chest film with pulmonary edema and right upper lobe infiltrate, possible community acquired pneumonia.  I suspect this is related more to heart failure than true pneumonia.  Will discontinue further antibiotics and observe.  3. AKI on CKD stage 4.  Baseline creatinine approximately 2.5.  On admission, creatinine noted to be elevated at 3.0.  This was initially trending up with diuresis but has now started to improve. Continue diuresis for hypervolemia, avoid nephrotoxic medications or hypotension. Follow up renal panel in am  4. HTN. Continue blood pressure control with amlodipine and metoprolol.  5. T2DM. Fasting glucose this am 107, will hold on long acting insulin for now in the setting of AKI due to risk of hypoglycemia, Will continue glucose control with insulin sliding scale.   6. Dyslipidemia. Continue with statin therapy.   7. Hypothyroid. Continue with levothyroxine.   DVT prophylaxis: enoxaparin   Code Status:  full Family Communication: no family at the bedside  Disposition Plan/ discharge barriers: pending clinical improvement.   Body mass index is 37.72 kg/m. Malnutrition Type:      Malnutrition Characteristics:      Nutrition Interventions:     RN Pressure Injury Documentation:     Consultants:     Procedures:     Antimicrobials:       Subjective: Feels that his breathing is improving, but not back to baseline.  Becomes short of breath on exertion.  Objective: Vitals:   09/27/19 1111 09/27/19 1113 09/27/19 1437 09/27/19 1600  BP: Marland Kitchen)  164/57 (!) 160/53 (!) 154/51   Pulse: 72 77 70   Resp: 18 16 16    Temp:    97.6 F (36.4 C)  TempSrc:    Oral  SpO2: 100% 98% 98%   Weight:      Height:        Intake/Output  Summary (Last 24 hours) at 09/27/2019 1847 Last data filed at 09/27/2019 1818 Gross per 24 hour  Intake 603 ml  Output 3225 ml  Net -2622 ml   Filed Weights   09/25/19 0505 09/26/19 0700 09/27/19 0455  Weight: 118.3 kg 117.3 kg 115.8 kg    Examination:   General exam: Alert, awake, oriented x 3 Respiratory system: Clear to auscultation. Respiratory effort normal. Cardiovascular system:RRR. No murmurs, rubs, gallops. Gastrointestinal system: Abdomen is nondistended, soft and nontender. No organomegaly or masses felt. Normal bowel sounds heard. Central nervous system: Alert and oriented. No focal neurological deficits. Extremities: 1+ edema bilaterally Skin: No rashes, lesions or ulcers Psychiatry: Judgement and insight appear normal. Mood & affect appropriate.    Data Reviewed: I have personally reviewed following labs and imaging studies  CBC: Recent Labs  Lab 09/23/19 2011 09/24/19 0411 09/25/19 0453  WBC 5.4 4.4 4.1  NEUTROABS 3.2  --  1.8  HGB 9.9* 10.1* 10.0*  HCT 31.4* 32.7* 31.8*  MCV 91.3 91.3 91.4  PLT 195 214 [id number]   Basic Metabolic Panel: Recent Labs  Lab 09/23/19 2011 09/24/19 0411 09/25/19 0453 09/26/19 0431 09/27/19 0553  NA 137 141 141 141 139  K 3.9 4.0 4.3 4.3 4.2  CL 109 112* 109 109 105  CO2 20* 21* 21* 22 23  GLUCOSE 145* 119* 104* 95 107*  BUN 32* 34* 38* 42* 41*  CREATININE 3.02* 3.13* 3.27* 3.26* 3.05*  CALCIUM 6.7* 7.1* 7.3* 7.4* 7.6*   GFR: Estimated Creatinine Clearance: 25 mL/min (A) (by C-G formula based on SCr of 3.05 mg/dL (H)). Liver Function Tests: Recent Labs  Lab 09/23/19 2011  AST 19  ALT 16  ALKPHOS 85  BILITOT 0.5  PROT 6.1*  ALBUMIN 2.6*   No results for input(s): LIPASE, AMYLASE in the last 168 hours. No results for input(s): AMMONIA in the last 168 hours. Coagulation Profile: No results for input(s): INR, PROTIME in the last 168 hours. Cardiac Enzymes: No results for input(s): CKTOTAL, CKMB, CKMBINDEX,  TROPONINI in the last 168 hours. BNP (last 3 results) No results for input(s): PROBNP in the last 8760 hours. HbA1C: No results for input(s): HGBA1C in the last 72 hours. CBG: Recent Labs  Lab 09/27/19 0919  GLUCAP 153*   Lipid Profile: No results for input(s): CHOL, HDL, LDLCALC, TRIG, CHOLHDL, LDLDIRECT in the last 72 hours. Thyroid Function Tests: No results for input(s): TSH, T4TOTAL, FREET4, T3FREE, THYROIDAB in the last 72 hours. Anemia Panel: No results for input(s): VITAMINB12, FOLATE, FERRITIN, TIBC, IRON, RETICCTPCT in the last 72 hours.    Radiology Studies: I have reviewed all of the imaging during this hospital visit personally     Scheduled Meds: . aspirin EC  81 mg Oral Daily  . atorvastatin  40 mg Oral q1800  . furosemide  80 mg Intravenous Q12H  . heparin  5,000 Units Subcutaneous Q8H  . levothyroxine  150 mcg Oral QAC breakfast  . metoprolol tartrate  100 mg Oral BID  . sodium chloride flush  3 mL Intravenous Q12H  . ascorbic acid  500 mg Oral Daily   Continuous Infusions: . sodium chloride  LOS: 3 days        Kathie Dike, MD

## 2019-09-27 NOTE — Progress Notes (Signed)
Patient reported "this morning when I woke up it looked like the TV was on the floor. So I wiped my eyes and looked again and it was normal. Can you check my blood sugar." patient had just eaten breakfast. CBG 153. Denied any further visual disturbance, no dizziness. Vitals stable. Patient stated "I'm going home, so call the doctor." Notified Dr. Roderic Palau. Donavan Foil, RN

## 2019-09-27 NOTE — Progress Notes (Signed)
Orthostatic BP assessed this am per MD request. No significant drop in BP noted. Patient asymptomatic with standing and ambulation. Ambulated in hallway with standby assist this am. Maintained O2 sats 96-98% on r/a. Donavan Foil, RN

## 2019-09-28 LAB — CULTURE, BLOOD (ROUTINE X 2)
Culture: NO GROWTH
Culture: NO GROWTH
Special Requests: ADEQUATE
Special Requests: ADEQUATE

## 2019-09-28 LAB — GLUCOSE, CAPILLARY
Glucose-Capillary: 100 mg/dL — ABNORMAL HIGH (ref 70–99)
Glucose-Capillary: 102 mg/dL — ABNORMAL HIGH (ref 70–99)
Glucose-Capillary: 106 mg/dL — ABNORMAL HIGH (ref 70–99)
Glucose-Capillary: 163 mg/dL — ABNORMAL HIGH (ref 70–99)

## 2019-09-28 LAB — BASIC METABOLIC PANEL
Anion gap: 11 (ref 5–15)
BUN: 42 mg/dL — ABNORMAL HIGH (ref 8–23)
CO2: 21 mmol/L — ABNORMAL LOW (ref 22–32)
Calcium: 7.3 mg/dL — ABNORMAL LOW (ref 8.9–10.3)
Chloride: 106 mmol/L (ref 98–111)
Creatinine, Ser: 3.02 mg/dL — ABNORMAL HIGH (ref 0.61–1.24)
GFR calc Af Amer: 22 mL/min — ABNORMAL LOW (ref >60)
GFR calc non Af Amer: 19 mL/min — ABNORMAL LOW (ref >60)
Glucose, Bld: 135 mg/dL — ABNORMAL HIGH (ref 70–99)
Potassium: 4 mmol/L (ref 3.5–5.1)
Sodium: 138 mmol/L (ref 135–145)

## 2019-09-28 NOTE — Progress Notes (Signed)
PROGRESS NOTE    Michael Trevino  [id number] DOB: June 25, 1941 DOA: 09/23/2019 PCP: Dettinger, Fransisca Kaufmann, MD    Brief Narrative:  78 year old male who presented with dyspnea and lower extremity edema.  He does have significant past medical history for hypertension, dyslipidemia, type 2 diabetes mellitus, coronary disease, history of CVA, and hypothyroidism.  He presented with worsening dyspnea, over the last 6 months, but more intense over the last few days.  Dyspnea associated with lower extremity edema and increased abdominal girth.  Positive orthopnea.  On his initial physical examination temperature 98.2, heart rate 84, blood pressure 168/59, oxygen saturation 95% on room air.  His lungs had decreased breath sounds bilaterally, positive Rales, no wheezing, heart S1-S2 present, irregular, abdomen protuberant, 4+ lower extremity edema. Sodium 137, potassium 3.9, chloride 109, bicarb 20, glucose 145, BUN 32, creatinine 3.0, BNP 297, troponin 47.  Chest x-ray with a left pleural effusion, increased hilar vascular congestion, positive infiltrate right upper lobe.  EKG 77 bpm, left axis deviation, left anterior fascicular block, sinus rhythm, no ST segment or T wave changes, poor R wave progression.  Patient was admitted to the hospital working diagnosis of acute on chronic diastolic heart failure complicated by right upper lobe community-acquired pneumonia.    Assessment & Plan:   Active Problems:   Hypothyroidism   CKD (chronic kidney disease) stage 3, GFR 30-59 ml/min   CAD (coronary artery disease)   Mixed hyperlipidemia   Uncontrolled type 2 diabetes mellitus with stage 3 chronic kidney disease (HCC)   Essential hypertension, benign   Fluid overload   1. Acute on chronic diastolic heart failure. Patient continues to have evidence of volume overload, will continue aggressive diuresis with IV furosemide to target a negative fluid balance, continue blood pressure control. His weight is down  approximately 5.5 pounds since admission.  Echocardiogram shows normal ejection fraction with severe left ventricular hypertrophy.  Troponin elevation due to heart failure, no signs of acute coronary syndrome. Continue metoprolol 100 mg bid.  2. AKI on CKD stage 4.  Baseline creatinine approximately 2.5.  On admission, creatinine noted to be elevated at 3.0.  This was initially trending up with diuresis but has now started to improve. Continue diuresis for hypervolemia, avoid nephrotoxic medications or hypotension. Follow up renal panel in am  3. HTN. Continue blood pressure control with amlodipine and metoprolol.  4. T2DM. Fasting glucose this am 135, will hold on long acting insulin for now in the setting of AKI due to risk of hypoglycemia, Will continue glucose control with insulin sliding scale.   5. Dyslipidemia. Continue with statin therapy.   6. Hypothyroid. Continue with levothyroxine.   DVT prophylaxis: enoxaparin   Code Status:  full Family Communication: no family at the bedside  Disposition Plan/ discharge barriers: pending clinical improvement.   Body mass index is 37.48 kg/m. Malnutrition Type:      Malnutrition Characteristics:      Nutrition Interventions:     RN Pressure Injury Documentation:     Consultants:     Procedures:     Antimicrobials:       Subjective: Continues to have lower extremity edema.  Still has some shortness of breath on exertion.  Objective: Vitals:   09/27/19 1600 09/27/19 2156 09/28/19 0525 09/28/19 1408  BP:  (!) 155/57 (!) 157/47 (!) 162/51  Pulse:  75 68 72  Resp:  18 18 16   Temp: 97.6 F (36.4 C)   (!) 97.4 F (36.3 C)  TempSrc: Oral  Oral  SpO2:  97% 100% 100%  Weight:   115.1 kg   Height:        Intake/Output Summary (Last 24 hours) at 09/28/2019 1803 Last data filed at 09/28/2019 1746 Gross per 24 hour  Intake 960 ml  Output 2800 ml  Net -1840 ml   Filed Weights   09/26/19 0700 09/27/19 0455  09/28/19 0525  Weight: 117.3 kg 115.8 kg 115.1 kg    Examination:   General exam: Alert, awake, oriented x 3 Respiratory system: Clear to auscultation. Respiratory effort normal. Cardiovascular system:RRR. No murmurs, rubs, gallops. Gastrointestinal system: Abdomen is nondistended, soft and nontender. No organomegaly or masses felt. Normal bowel sounds heard. Central nervous system: Alert and oriented. No focal neurological deficits. Extremities: 1+ edema bilaterally Skin: No rashes, lesions or ulcers Psychiatry: Judgement and insight appear normal. Mood & affect appropriate.     Data Reviewed: I have personally reviewed following labs and imaging studies  CBC: Recent Labs  Lab 09/23/19 2011 09/24/19 0411 09/25/19 0453  WBC 5.4 4.4 4.1  NEUTROABS 3.2  --  1.8  HGB 9.9* 10.1* 10.0*  HCT 31.4* 32.7* 31.8*  MCV 91.3 91.3 91.4  PLT 195 214 [id number]   Basic Metabolic Panel: Recent Labs  Lab 09/24/19 0411 09/25/19 0453 09/26/19 0431 09/27/19 0553 09/28/19 0600  NA 141 141 141 139 138  K 4.0 4.3 4.3 4.2 4.0  CL 112* 109 109 105 106  CO2 21* 21* 22 23 21*  GLUCOSE 119* 104* 95 107* 135*  BUN 34* 38* 42* 41* 42*  CREATININE 3.13* 3.27* 3.26* 3.05* 3.02*  CALCIUM 7.1* 7.3* 7.4* 7.6* 7.3*   GFR: Estimated Creatinine Clearance: 25.2 mL/min (A) (by C-G formula based on SCr of 3.02 mg/dL (H)). Liver Function Tests: Recent Labs  Lab 09/23/19 2011  AST 19  ALT 16  ALKPHOS 85  BILITOT 0.5  PROT 6.1*  ALBUMIN 2.6*   No results for input(s): LIPASE, AMYLASE in the last 168 hours. No results for input(s): AMMONIA in the last 168 hours. Coagulation Profile: No results for input(s): INR, PROTIME in the last 168 hours. Cardiac Enzymes: No results for input(s): CKTOTAL, CKMB, CKMBINDEX, TROPONINI in the last 168 hours. BNP (last 3 results) No results for input(s): PROBNP in the last 8760 hours. HbA1C: No results for input(s): HGBA1C in the last 72 hours. CBG: Recent Labs   Lab 09/27/19 0919 09/27/19 2155 09/28/19 0730 09/28/19 1115 09/28/19 1619  GLUCAP 153* 102* 106* 163* 102*   Lipid Profile: No results for input(s): CHOL, HDL, LDLCALC, TRIG, CHOLHDL, LDLDIRECT in the last 72 hours. Thyroid Function Tests: No results for input(s): TSH, T4TOTAL, FREET4, T3FREE, THYROIDAB in the last 72 hours. Anemia Panel: No results for input(s): VITAMINB12, FOLATE, FERRITIN, TIBC, IRON, RETICCTPCT in the last 72 hours.    Radiology Studies: I have reviewed all of the imaging during this hospital visit personally     Scheduled Meds: . aspirin EC  81 mg Oral Daily  . atorvastatin  40 mg Oral q1800  . furosemide  80 mg Intravenous Q12H  . heparin  5,000 Units Subcutaneous Q8H  . insulin aspart  0-9 Units Subcutaneous TID WC  . levothyroxine  150 mcg Oral QAC breakfast  . metoprolol tartrate  100 mg Oral BID  . sodium chloride flush  3 mL Intravenous Q12H  . ascorbic acid  500 mg Oral Daily   Continuous Infusions: . sodium chloride       LOS: 4 days  Kathie Dike, MD

## 2019-09-29 LAB — BASIC METABOLIC PANEL
Anion gap: 10 (ref 5–15)
BUN: 42 mg/dL — ABNORMAL HIGH (ref 8–23)
CO2: 22 mmol/L (ref 22–32)
Calcium: 7.2 mg/dL — ABNORMAL LOW (ref 8.9–10.3)
Chloride: 105 mmol/L (ref 98–111)
Creatinine, Ser: 3.03 mg/dL — ABNORMAL HIGH (ref 0.61–1.24)
GFR calc Af Amer: 22 mL/min — ABNORMAL LOW (ref >60)
GFR calc non Af Amer: 19 mL/min — ABNORMAL LOW (ref >60)
Glucose, Bld: 126 mg/dL — ABNORMAL HIGH (ref 70–99)
Potassium: 4 mmol/L (ref 3.5–5.1)
Sodium: 137 mmol/L (ref 135–145)

## 2019-09-29 LAB — GLUCOSE, CAPILLARY
Glucose-Capillary: 105 mg/dL — ABNORMAL HIGH (ref 70–99)
Glucose-Capillary: 163 mg/dL — ABNORMAL HIGH (ref 70–99)
Glucose-Capillary: 122 mg/dL — ABNORMAL HIGH (ref 70–99)
Glucose-Capillary: 153 mg/dL — ABNORMAL HIGH (ref 70–99)

## 2019-09-29 MED ORDER — METOLAZONE 5 MG PO TABS
5.0000 mg | ORAL_TABLET | Freq: Once | ORAL | Status: AC
  Administered 2019-09-29: 5 mg via ORAL
  Filled 2019-09-29: qty 1

## 2019-09-29 MED ORDER — TORSEMIDE 20 MG PO TABS
40.0000 mg | ORAL_TABLET | Freq: Two times a day (BID) | ORAL | Status: DC
  Administered 2019-09-29 – 2019-09-30 (×2): 40 mg via ORAL
  Filled 2019-09-29 (×2): qty 2

## 2019-09-29 NOTE — Progress Notes (Signed)
PROGRESS NOTE    Michael Trevino  [id number] DOB: 1941/08/07 DOA: 09/23/2019 PCP: Dettinger, Fransisca Kaufmann, MD    Brief Narrative:  78 year old male who presented with dyspnea and lower extremity edema.  He does have significant past medical history for hypertension, dyslipidemia, type 2 diabetes mellitus, coronary disease, history of CVA, and hypothyroidism.  He presented with worsening dyspnea, over the last 6 months, but more intense over the last few days.  Dyspnea associated with lower extremity edema and increased abdominal girth.  Positive orthopnea.  On his initial physical examination temperature 98.2, heart rate 84, blood pressure 168/59, oxygen saturation 95% on room air.  His lungs had decreased breath sounds bilaterally, positive Rales, no wheezing, heart S1-S2 present, irregular, abdomen protuberant, 4+ lower extremity edema. Sodium 137, potassium 3.9, chloride 109, bicarb 20, glucose 145, BUN 32, creatinine 3.0, BNP 297, troponin 47.  Chest x-ray with a left pleural effusion, increased hilar vascular congestion, positive infiltrate right upper lobe.  EKG 77 bpm, left axis deviation, left anterior fascicular block, sinus rhythm, no ST segment or T wave changes, poor R wave progression.  Patient was admitted to the hospital working diagnosis of acute on chronic diastolic heart failure complicated by right upper lobe community-acquired pneumonia.    Assessment & Plan:   Active Problems:   Hypothyroidism   CKD (chronic kidney disease) stage 3, GFR 30-59 ml/min   CAD (coronary artery disease)   Mixed hyperlipidemia   Uncontrolled type 2 diabetes mellitus with stage 3 chronic kidney disease (HCC)   Essential hypertension, benign   Fluid overload   1. Acute on chronic diastolic heart failure. Patient continues to have evidence of volume overload, will continue aggressive diuresis with IV furosemide to target a negative fluid balance, continue blood pressure control. His weight is down  approximately 5.5 pounds since admission.  Echocardiogram shows normal ejection fraction with severe left ventricular hypertrophy.  Troponin elevation due to heart failure, no signs of acute coronary syndrome. Continue metoprolol 100 mg bid.  Since his overall breathing is improving, will transition the patient to torsemide twice daily and give a dose of metolazone.  2. AKI on CKD stage 4.  Baseline creatinine approximately 2.5.  On admission, creatinine noted to be elevated at 3.0.  This was initially trending up with diuresis but has now started to improve. Continue diuresis for hypervolemia, avoid nephrotoxic medications or hypotension. Follow up renal panel in am  3. HTN. Continue blood pressure control with amlodipine and metoprolol.  4. T2DM. Fasting glucose this am 135, will hold on long acting insulin for now in the setting of AKI due to risk of hypoglycemia, Will continue glucose control with insulin sliding scale.   5. Dyslipidemia. Continue with statin therapy.   6. Hypothyroid. Continue with levothyroxine.   DVT prophylaxis: enoxaparin   Code Status:  full Family Communication: no family at the bedside  Disposition Plan/ discharge barriers: pending clinical improvement.   Body mass index is 37.39 kg/m. Malnutrition Type:      Malnutrition Characteristics:      Nutrition Interventions:     RN Pressure Injury Documentation:     Consultants:     Procedures:     Antimicrobials:       Subjective: Patient is feeling better.  Still has significant lower extremity edema.  Does not feel that his swelling is back to baseline.  Objective: Vitals:   09/28/19 2246 09/29/19 0531 09/29/19 0800 09/29/19 1408  BP: (!) 169/54 (!) 161/56 (!) 154/51 (!) 165/56  Pulse: 71 66 63 72  Resp: 16 20 20 19   Temp: 98.1 F (36.7 C) (!) 97.3 F (36.3 C) 98.3 F (36.8 C) 98.7 F (37.1 C)  TempSrc: Oral Oral Oral Oral  SpO2: 98% 99% 99% 97%  Weight:  114.9 kg    Height:         Intake/Output Summary (Last 24 hours) at 09/29/2019 1904 Last data filed at 09/29/2019 1840 Gross per 24 hour  Intake 720 ml  Output 1700 ml  Net -980 ml   Filed Weights   09/27/19 0455 09/28/19 0525 09/29/19 0531  Weight: 115.8 kg 115.1 kg 114.9 kg    Examination:   General exam: Alert, awake, oriented x 3 Respiratory system: Clear to auscultation. Respiratory effort normal. Cardiovascular system:RRR. No murmurs, rubs, gallops. Gastrointestinal system: Abdomen is nondistended, soft and nontender. No organomegaly or masses felt. Normal bowel sounds heard. Central nervous system: Alert and oriented. No focal neurological deficits. Extremities: 1-2+ lower extremity edema bilaterally Skin: No rashes, lesions or ulcers Psychiatry: Judgement and insight appear normal. Mood & affect appropriate.      Data Reviewed: I have personally reviewed following labs and imaging studies  CBC: Recent Labs  Lab 09/23/19 2011 09/24/19 0411 09/25/19 0453  WBC 5.4 4.4 4.1  NEUTROABS 3.2  --  1.8  HGB 9.9* 10.1* 10.0*  HCT 31.4* 32.7* 31.8*  MCV 91.3 91.3 91.4  PLT 195 214 [id number]   Basic Metabolic Panel: Recent Labs  Lab 09/25/19 0453 09/26/19 0431 09/27/19 0553 09/28/19 0600 09/29/19 0437  NA 141 141 139 138 137  K 4.3 4.3 4.2 4.0 4.0  CL 109 109 105 106 105  CO2 21* 22 23 21* 22  GLUCOSE 104* 95 107* 135* 126*  BUN 38* 42* 41* 42* 42*  CREATININE 3.27* 3.26* 3.05* 3.02* 3.03*  CALCIUM 7.3* 7.4* 7.6* 7.3* 7.2*   GFR: Estimated Creatinine Clearance: 25.1 mL/min (A) (by C-G formula based on SCr of 3.03 mg/dL (H)). Liver Function Tests: Recent Labs  Lab 09/23/19 2011  AST 19  ALT 16  ALKPHOS 85  BILITOT 0.5  PROT 6.1*  ALBUMIN 2.6*   No results for input(s): LIPASE, AMYLASE in the last 168 hours. No results for input(s): AMMONIA in the last 168 hours. Coagulation Profile: No results for input(s): INR, PROTIME in the last 168 hours. Cardiac Enzymes: No results  for input(s): CKTOTAL, CKMB, CKMBINDEX, TROPONINI in the last 168 hours. BNP (last 3 results) No results for input(s): PROBNP in the last 8760 hours. HbA1C: No results for input(s): HGBA1C in the last 72 hours. CBG: Recent Labs  Lab 09/28/19 1619 09/28/19 2345 09/29/19 0726 09/29/19 1103 09/29/19 1607  GLUCAP 102* 100* 105* 163* 122*   Lipid Profile: No results for input(s): CHOL, HDL, LDLCALC, TRIG, CHOLHDL, LDLDIRECT in the last 72 hours. Thyroid Function Tests: No results for input(s): TSH, T4TOTAL, FREET4, T3FREE, THYROIDAB in the last 72 hours. Anemia Panel: No results for input(s): VITAMINB12, FOLATE, FERRITIN, TIBC, IRON, RETICCTPCT in the last 72 hours.    Radiology Studies: I have reviewed all of the imaging during this hospital visit personally     Scheduled Meds: . aspirin EC  81 mg Oral Daily  . atorvastatin  40 mg Oral q1800  . heparin  5,000 Units Subcutaneous Q8H  . insulin aspart  0-9 Units Subcutaneous TID WC  . levothyroxine  150 mcg Oral QAC breakfast  . metoprolol tartrate  100 mg Oral BID  . sodium chloride flush  3  mL Intravenous Q12H  . torsemide  40 mg Oral BID  . ascorbic acid  500 mg Oral Daily   Continuous Infusions: . sodium chloride       LOS: 5 days        Kathie Dike, MD

## 2019-09-30 DIAGNOSIS — N184 Chronic kidney disease, stage 4 (severe): Secondary | ICD-10-CM

## 2019-09-30 DIAGNOSIS — I509 Heart failure, unspecified: Secondary | ICD-10-CM | POA: Diagnosis present

## 2019-09-30 DIAGNOSIS — M1990 Unspecified osteoarthritis, unspecified site: Secondary | ICD-10-CM

## 2019-09-30 DIAGNOSIS — I5033 Acute on chronic diastolic (congestive) heart failure: Secondary | ICD-10-CM | POA: Diagnosis present

## 2019-09-30 LAB — BASIC METABOLIC PANEL
Anion gap: 9 (ref 5–15)
BUN: 47 mg/dL — ABNORMAL HIGH (ref 8–23)
CO2: 22 mmol/L (ref 22–32)
Calcium: 7.2 mg/dL — ABNORMAL LOW (ref 8.9–10.3)
Chloride: 105 mmol/L (ref 98–111)
Creatinine, Ser: 3.29 mg/dL — ABNORMAL HIGH (ref 0.61–1.24)
GFR calc Af Amer: 20 mL/min — ABNORMAL LOW (ref >60)
GFR calc non Af Amer: 17 mL/min — ABNORMAL LOW (ref >60)
Glucose, Bld: 112 mg/dL — ABNORMAL HIGH (ref 70–99)
Potassium: 4 mmol/L (ref 3.5–5.1)
Sodium: 136 mmol/L (ref 135–145)

## 2019-09-30 LAB — GLUCOSE, CAPILLARY: Glucose-Capillary: 106 mg/dL — ABNORMAL HIGH (ref 70–99)

## 2019-09-30 NOTE — Care Management Important Message (Signed)
Important Message  Patient Details  Name: Michael Trevino MRN: [id number] Date of Birth: 1941/06/21   Medicare Important Message Given:  Yes     Tommy Medal 09/30/2019, 3:20 PM

## 2019-09-30 NOTE — Progress Notes (Signed)
Nsg Discharge Note  Admit Date:  09/23/2019 Discharge date: 09/30/2019   Michael Trevino to be D/C'd Home per MD order.  AVS completed.  Copy for chart, and copy for patient signed, and dated. Patient/caregiver able to verbalize understanding.  Discharge Medication: Allergies as of 09/30/2019      Reactions   Zocor [simvastatin - High Dose] Nausea Only   Unknown      Medication List    STOP taking these medications   glimepiride 2 MG tablet Commonly known as: AMARYL   linagliptin 5 MG Tabs tablet Commonly known as: Tradjenta   olmesartan 40 MG tablet Commonly known as: BENICAR     TAKE these medications   amLODipine 10 MG tablet Commonly known as: NORVASC Take 1 tablet (10 mg total) by mouth daily.   ascorbic acid 500 MG tablet Commonly known as: VITAMIN C Take 500 mg by mouth daily.   aspirin EC 81 MG tablet Take 81 mg by mouth daily.   atorvastatin 40 MG tablet Commonly known as: LIPITOR TAKE 1 TABLET BY MOUTH EVERY DAY   Calcium Carbonate 500 MG Chew Chew 1 tablet (500 mg total) by mouth 3 (three) times daily as needed. What changed: when to take this   furosemide 80 MG tablet Commonly known as: LASIX Take 1 tablet (80 mg total) by mouth 2 (two) times daily.   glucose blood test strip Use to check blood glucose once daily.  Dx:  E11.9   Insulin Detemir 100 UNIT/ML Pen Commonly known as: Levemir FlexPen Inject 30 Units into the skin at bedtime.   levothyroxine 150 MCG tablet Commonly known as: SYNTHROID Take 1 tablet (150 mcg total) by mouth daily before breakfast.   metoprolol tartrate 100 MG tablet Commonly known as: LOPRESSOR Take 1 tablet (100 mg total) by mouth 2 (two) times daily.       Discharge Assessment: Vitals:   09/29/19 2243 09/30/19 0627  BP: (!) 146/45 (!) 155/52  Pulse: 71 75  Resp:  20  Temp:  97.9 F (36.6 C)  SpO2:  100%   Skin clean, dry and intact without evidence of skin break down, no evidence of skin tears  noted. IV catheter discontinued intact. Site without signs and symptoms of complications - no redness or edema noted at insertion site, patient denies c/o pain - only slight tenderness at site.  Dressing with slight pressure applied.  D/c Instructions-Education: Discharge instructions given to patient/family with verbalized understanding. D/c education completed with patient/family including follow up instructions, medication list, d/c activities limitations if indicated, with other d/c instructions as indicated by MD - patient able to verbalize understanding, all questions fully answered. Patient instructed to return to ED, call 911, or call MD for any changes in condition.  Patient escorted via [place], and D/C home via private auto.  [name], RN 09/30/2019 9:56 AM

## 2019-09-30 NOTE — Discharge Summary (Signed)
Physician Discharge Summary  Michael Trevino [id number] DOB: 1941/06/01 DOA: 09/23/2019  PCP: Dettinger, Fransisca Kaufmann, MD  Admit date: 09/23/2019 Discharge date: 09/30/2019  Admitted From: Home Disposition: Home  Recommendations for Outpatient Follow-up:  1. Follow up with PCP in 1-2 weeks 2. Please obtain BMP/CBC in one week 3. Follow-up with primary nephrologist in the next 1 to 2 weeks  Home Health: Home health RN Equipment/Devices:  Discharge Condition: Stable CODE STATUS: Full code Diet recommendation: Heart healthy, carb modified  Brief/Interim Summary: 78 year old male admitted to the hospital with decompensated CHF.  He has known chronic kidney disease stage IV, diabetes and hypertension.  The patient was treated with intravenous furosemide.  Echocardiogram was performed and showed normal ejection fraction with severe left ventricular hypertrophy.  With intravenous diuretics, his weight decreased approximately 11 pounds during his hospital stay.  He reports that he was not taking his Lasix at home as he was previously prescribed.  It was prescribed twice daily, but he was taking it once daily.  He has been advised to take his medications as prescribed.  Home health RN has been set up to help with compliance.  His baseline creatinine appears to be approximately 2.5.  Since his admission, his creatinine has been approximately 3.  This appears to have been stable with diuresis.  He is already followed by a nephrologist and has been advised to follow-up with his primary nephrologist in the next 1 to 2 weeks.  Benicar has been discontinued due to renal dysfunction.  Since he has been diuresed, he no longer feels short of breath.  Although he does still have some lower extremity edema, he feels that this is baseline.  He is requesting discharge home.  The remainder of his medical problems remained stable.  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)   Essential hypertension, benign   CKD stage 4 due to type 2 diabetes mellitus (HCC)   Fluid overload   Acute on chronic diastolic CHF (congestive heart failure) [facility])    Discharge Instructions  Discharge Instructions    Diet - low sodium heart healthy   Complete by: As directed    Increase activity slowly   Complete by: As directed      Allergies as of 09/30/2019      Reactions   Zocor [simvastatin - High Dose] Nausea Only   Unknown      Medication List    STOP taking these medications   glimepiride 2 MG tablet Commonly known as: AMARYL   linagliptin 5 MG Tabs tablet Commonly known as: Tradjenta   olmesartan 40 MG tablet Commonly known as: BENICAR     TAKE these medications   amLODipine 10 MG tablet Commonly known as: NORVASC Take 1 tablet (10 mg total) by mouth daily.   ascorbic acid 500 MG tablet Commonly known as: VITAMIN C Take 500 mg by mouth daily.   aspirin EC 81 MG tablet Take 81 mg by mouth daily.   atorvastatin 40 MG tablet Commonly known as: LIPITOR TAKE 1 TABLET BY MOUTH EVERY DAY   Calcium Carbonate 500 MG Chew Chew 1 tablet (500 mg total) by mouth 3 (three) times daily as needed. What changed: when to take this   furosemide 80 MG tablet Commonly known as: LASIX Take 1 tablet (80 mg total) by mouth 2 (two)  times daily.   glucose blood test strip Use to check blood glucose once daily.  Dx:  E11.9   Insulin Detemir 100 UNIT/ML Pen Commonly known as: Levemir FlexPen Inject 30 Units into the skin at bedtime.   levothyroxine 150 MCG tablet Commonly known as: SYNTHROID Take 1 tablet (150 mcg total) by mouth daily before breakfast.   metoprolol tartrate 100 MG tablet Commonly known as: LOPRESSOR Take 1 tablet (100 mg total) by mouth 2 (two) times daily.      Follow-up Information    [facility].   Specialty: Home Health Services Why: RN       Dettinger, Fransisca Kaufmann, MD. Schedule an appointment as soon as possible for a visit in 2 week(s).   Specialties: Family Medicine, Cardiology Contact information: [place] [address] [phone]        [place], [place]. Schedule an appointment as soon as possible for a visit in 2 week(s).   Contact information: [place] [address] [phone]          Allergies  Allergen Reactions  . Zocor [Simvastatin - High Dose] Nausea Only    Unknown    Consultations:     Procedures/Studies: Dg Chest 2 View  Result Date: 09/25/2019 CLINICAL DATA:  Pneumonia. Follow-up. EXAM: CHEST - 2 VIEW COMPARISON:  Chest CT yesterday. Radiograph 09/23/2019 FINDINGS: Slight improvement in patchy right upper lobe opacities from prior exam. No significant change in left pleural effusion and adjacent atelectasis/consolidation. Small right pleural effusion on CT faintly visualized radiographically. Unchanged heart size and mediastinal contours with borderline cardiomegaly. No pulmonary edema. No pneumothorax. Unchanged osseous structures. IMPRESSION: 1. Slight improvement in patchy right upper lobe pneumonia. 2. Unchanged left pleural effusion and adjacent atelectasis/consolidation. 3. No other change from prior exam. Electronically Signed   By: Keith Rake M.D.   On: 09/25/2019 14:23   Dg Chest 2 View  Result Date: 09/23/2019 CLINICAL DATA:  78 year old male with shortness of breath EXAM: CHEST - 2 VIEW COMPARISON:  Chest radiograph dated 02/23/2013 FINDINGS: There is a small left pleural effusion with left lung base atelectasis or infiltrate. Patchy area of airspace opacity in the right upper lobe most consistent with pneumonia. Clinical correlation and follow-up to resolution recommended. There is no pneumothorax. Stable cardiomegaly. Atherosclerotic calcification of the aorta. Degenerative changes of  the spine. No acute osseous pathology. IMPRESSION: 1. Small left pleural effusion with left lung base atelectasis or infiltrate. 2. Patchy area of airspace opacity in the right upper lobe most consistent with pneumonia. Clinical correlation and follow-up to resolution recommended. Electronically Signed   By: Anner Crete M.D.   On: 09/23/2019 13:20   Ct Chest Wo Contrast  Result Date: 09/24/2019 CLINICAL DATA:  Shortness of breath and pleural effusion EXAM: CT CHEST WITHOUT CONTRAST TECHNIQUE: Multidetector CT imaging of the chest was performed following the standard protocol without IV contrast. COMPARISON:  None. FINDINGS: Cardiovascular: There is calcific aortic atherosclerosis. There are coronary artery calcifications and a small pericardial effusion. Heart size is normal. Mediastinum/Nodes: No enlarged mediastinal or axillary lymph nodes. Thyroid gland, trachea, and esophagus demonstrate no significant findings. Lungs/Pleura: Small pleural effusions. There is multifocal ground-glass opacity in the right upper lobe. No other focal consolidation. There is bibasilar atelectasis. Upper Abdomen: Low attenuation structure in the left hepatic lobe measures 5.7 x 4.7 cm. Musculoskeletal: There is flowing anterior osteophytosis throughout the thoracic spine. IMPRESSION: 1. Right upper lobe pneumonia. 2. Small bilateral  pleural effusions. 3. Coronary artery and aortic atherosclerosis (ICD10-I70.0). 4. Low attenuation lesion in the left hepatic lobe, indeterminate, but possibly a complex cyst. Hepatic ultrasound would provide better characterization. Electronically Signed   By: Ulyses Jarred M.D.   On: 09/24/2019 03:46      Subjective: Feeling better.  Denies any shortness of breath.  Lower extremity edema is better.  Discharge Exam: Vitals:   09/29/19 1408 09/29/19 1946 09/29/19 2243 09/30/19 0627  BP: (!) 165/56 (!) 181/55 (!) 146/45 (!) 155/52  Pulse: 72 70 71 75  Resp: 19 20  20   Temp: 98.7 F  (37.1 C) 98.6 F (37 C)  97.9 F (36.6 C)  TempSrc: Oral Oral  Oral  SpO2: 97% 100%  100%  Weight:    113 kg  Height:        General: Pt is alert, awake, not in acute distress Cardiovascular: RRR, S1/S2 +, no rubs, no gallops Respiratory: CTA bilaterally, no wheezing, no rhonchi Abdominal: Soft, NT, ND, bowel sounds + Extremities: 1+ edema, no cyanosis    The results of significant diagnostics from this hospitalization (including imaging, microbiology, ancillary and laboratory) are listed below for reference.     Microbiology: Recent Results (from the past 240 hour(s))  Blood Culture (routine x 2)     Status: None   Collection Time: 09/23/19  8:01 PM   Specimen: BLOOD RIGHT ARM  Result Value Ref Range Status   Specimen Description BLOOD RIGHT ARM  Final   Special Requests   Final    BOTTLES DRAWN AEROBIC AND ANAEROBIC Blood Culture adequate volume   Culture   Final    NO GROWTH 5 DAYS Performed at [facility], [address], [place], [place] [address]    Report Status 09/28/2019 FINAL  Final  SARS CORONAVIRUS 2 (TAT 6-24 HRS) Nasopharyngeal Nasopharyngeal Swab     Status: None   Collection Time: 09/23/19  9:21 PM   Specimen: Nasopharyngeal Swab  Result Value Ref Range Status   SARS Coronavirus 2 NEGATIVE NEGATIVE Final    Comment: (NOTE) SARS-CoV-2 target nucleic acids are NOT DETECTED. The SARS-CoV-2 RNA is generally detectable in upper and lower respiratory specimens during the acute phase of infection. Negative results do not preclude SARS-CoV-2 infection, do not rule out co-infections with other pathogens, and should not be used as the sole basis for treatment or other patient management decisions. Negative results must be combined with clinical observations, patient history, and epidemiological information. The expected result is Negative. Fact Sheet for Patients: SugarRoll.be Fact Sheet for Healthcare  Providers: https://www.woods-mathews.com/ This test is not yet approved or cleared by the [place] FDA and  has been authorized for detection and/or diagnosis of SARS-CoV-2 by FDA under an Emergency Use Authorization (EUA). This EUA will remain  in effect (meaning this test can be used) for the duration of the COVID-19 declaration under Section 56 4(b)(1) of the Act, 21 U.S.C. section 360bbb-3(b)(1), unless the authorization is terminated or revoked sooner. Performed at [facility], [place] [address] [place], [place] [address]   Blood Culture (routine x 2)     Status: None   Collection Time: 09/23/19 10:08 PM   Specimen: BLOOD  Result Value Ref Range Status   Specimen Description BLOOD LEFT ANTECUBITAL  Final   Special Requests   Final    BOTTLES DRAWN AEROBIC AND ANAEROBIC Blood Culture adequate volume   Culture   Final    NO GROWTH 5 DAYS Performed at [facility], [address], [place], [place] [address]    Report Status 09/28/2019 FINAL  Final     Labs: BNP (last 3 results) Recent Labs    09/23/19 2011  BNP [id number]*   Basic Metabolic Panel: Recent Labs  Lab 09/26/19 0431 09/27/19 0553 09/28/19 0600 09/29/19 0437 09/30/19 0551  NA 141 139 138 137 136  K 4.3 4.2 4.0 4.0 4.0  CL 109 105 106 105 105  CO2 22 23 21* 22 22  GLUCOSE 95 107* 135* 126* 112*  BUN 42* 41* 42* 42* 47*  CREATININE 3.26* 3.05* 3.02* 3.03* 3.29*  CALCIUM 7.4* 7.6* 7.3* 7.2* 7.2*   Liver Function Tests: No results for input(s): AST, ALT, ALKPHOS, BILITOT, PROT, ALBUMIN in the last 168 hours. No results for input(s): LIPASE, AMYLASE in the last 168 hours. No results for input(s): AMMONIA in the last 168 hours. CBC: Recent Labs  Lab 09/24/19 0411 09/25/19 0453  WBC 4.4 4.1  NEUTROABS  --  1.8  HGB 10.1* 10.0*  HCT 32.7* 31.8*  MCV 91.3 91.4  PLT 214 203   Cardiac Enzymes: No results for input(s): CKTOTAL, CKMB, CKMBINDEX, TROPONINI in the last 168  hours. BNP: Invalid input(s): POCBNP CBG: Recent Labs  Lab 09/29/19 0726 09/29/19 1103 09/29/19 1607 09/29/19 1942 09/30/19 0805  GLUCAP 105* 163* 122* 153* 106*   D-Dimer No results for input(s): DDIMER in the last 72 hours. Hgb A1c No results for input(s): HGBA1C in the last 72 hours. Lipid Profile No results for input(s): CHOL, HDL, LDLCALC, TRIG, CHOLHDL, LDLDIRECT in the last 72 hours. Thyroid function studies No results for input(s): TSH, T4TOTAL, T3FREE, THYROIDAB in the last 72 hours.  Invalid input(s): FREET3 Anemia work up No results for input(s): VITAMINB12, FOLATE, FERRITIN, TIBC, IRON, RETICCTPCT in the last 72 hours. Urinalysis    Component Value Date/Time   COLORURINE YELLOW 09/25/2019 2125   APPEARANCEUR CLEAR 09/25/2019 2125   LABSPEC 1.014 09/25/2019 2125   PHURINE 5.0 09/25/2019 2125   GLUCOSEU 50 (A) 09/25/2019 2125   HGBUR NEGATIVE 09/25/2019 2125   [place] NEGATIVE 09/25/2019 2125   KETONESUR NEGATIVE 09/25/2019 2125   PROTEINUR >=300 (A) 09/25/2019 2125   UROBILINOGEN 1.0 02/23/2013 1136   NITRITE NEGATIVE 09/25/2019 2125   LEUKOCYTESUR NEGATIVE 09/25/2019 2125   Sepsis Labs Invalid input(s): PROCALCITONIN,  WBC,  LACTICIDVEN Microbiology Recent Results (from the past 240 hour(s))  Blood Culture (routine x 2)     Status: None   Collection Time: 09/23/19  8:01 PM   Specimen: BLOOD RIGHT ARM  Result Value Ref Range Status   Specimen Description BLOOD RIGHT ARM  Final   Special Requests   Final    BOTTLES DRAWN AEROBIC AND ANAEROBIC Blood Culture adequate volume   Culture   Final    NO GROWTH 5 DAYS Performed at [facility], [address], [place], [place] [address]    Report Status 09/28/2019 FINAL  Final  SARS CORONAVIRUS 2 (TAT 6-24 HRS) Nasopharyngeal Nasopharyngeal Swab     Status: None   Collection Time: 09/23/19  9:21 PM   Specimen: Nasopharyngeal Swab  Result Value Ref Range Status   SARS Coronavirus 2 NEGATIVE NEGATIVE  Final    Comment: (NOTE) SARS-CoV-2 target nucleic acids are NOT DETECTED. The SARS-CoV-2 RNA is generally detectable in upper and lower respiratory specimens during the acute phase of infection. Negative results do not preclude SARS-CoV-2 infection, do not rule out co-infections with other pathogens, and should not be used as the sole  basis for treatment or other patient management decisions. Negative results must be combined with clinical observations, patient history, and epidemiological information. The expected result is Negative. Fact Sheet for Patients: SugarRoll.be Fact Sheet for Healthcare Providers: https://www.woods-mathews.com/ This test is not yet approved or cleared by the [place] FDA and  has been authorized for detection and/or diagnosis of SARS-CoV-2 by FDA under an Emergency Use Authorization (EUA). This EUA will remain  in effect (meaning this test can be used) for the duration of the COVID-19 declaration under Section 56 4(b)(1) of the Act, 21 U.S.C. section 360bbb-3(b)(1), unless the authorization is terminated or revoked sooner. Performed at [facility], [place] [address] [place], [place] [address]   Blood Culture (routine x 2)     Status: None   Collection Time: 09/23/19 10:08 PM   Specimen: BLOOD  Result Value Ref Range Status   Specimen Description BLOOD LEFT ANTECUBITAL  Final   Special Requests   Final    BOTTLES DRAWN AEROBIC AND ANAEROBIC Blood Culture adequate volume   Culture   Final    NO GROWTH 5 DAYS Performed at [facility], [address], [place], [place] [address]    Report Status 09/28/2019 FINAL  Final     Time coordinating discharge: 24mins  SIGNED:   Kathie Dike, MD  [facility] 09/30/2019, 10:25 PM   If 7PM-7AM, please contact night-coverage [email or web address]

## 2019-09-30 NOTE — TOC Transition Note (Signed)
Transition of Care [facility]) - CM/SW Discharge Note   Patient Details  Name: Markail Michels MRN: [id number] Date of Birth: 24-Oct-1941  Transition of Care [facility]) CM/SW Contact:  Boneta Lucks, RN Phone Number: 09/30/2019, 11:01 AM   Clinical Narrative:   Patient discharging home today with Home Health.  [place] to update with discharge plan. Orders have been placed.   Barriers to Discharge: Continued Medical Work up   Patient Goals and CMS Choice Patient states their goals for this hospitalization and ongoing recovery are:: to go home. CMS Medicare.gov Compare Post Acute Care list provided to:: Patient Represenative (must comment) Choice offered to / list presented to : Spouse  HH Arranged: PT [place]: [place] ([place]) Date [place]: 09/26/19 Time [place]: [place] Representative spoke with at [place]: Romualdo Bolk  Readmission Risk Interventions No flowsheet data found.

## 2019-10-01 ENCOUNTER — Ambulatory Visit: Payer: Medicare Other | Admitting: Family Medicine

## 2019-10-02 ENCOUNTER — Encounter: Payer: Self-pay | Admitting: Family Medicine

## 2019-10-02 DIAGNOSIS — Z8673 Personal history of transient ischemic attack (TIA), and cerebral infarction without residual deficits: Secondary | ICD-10-CM | POA: Diagnosis not present

## 2019-10-02 DIAGNOSIS — E039 Hypothyroidism, unspecified: Secondary | ICD-10-CM | POA: Diagnosis not present

## 2019-10-02 DIAGNOSIS — I252 Old myocardial infarction: Secondary | ICD-10-CM | POA: Diagnosis not present

## 2019-10-02 DIAGNOSIS — I2510 Atherosclerotic heart disease of native coronary artery without angina pectoris: Secondary | ICD-10-CM | POA: Diagnosis not present

## 2019-10-02 DIAGNOSIS — E1122 Type 2 diabetes mellitus with diabetic chronic kidney disease: Secondary | ICD-10-CM | POA: Diagnosis not present

## 2019-10-02 DIAGNOSIS — I5033 Acute on chronic diastolic (congestive) heart failure: Secondary | ICD-10-CM | POA: Diagnosis not present

## 2019-10-02 DIAGNOSIS — E785 Hyperlipidemia, unspecified: Secondary | ICD-10-CM | POA: Diagnosis not present

## 2019-10-02 DIAGNOSIS — I110 Hypertensive heart disease with heart failure: Secondary | ICD-10-CM | POA: Diagnosis not present

## 2019-10-02 DIAGNOSIS — Z87891 Personal history of nicotine dependence: Secondary | ICD-10-CM | POA: Diagnosis not present

## 2019-10-02 DIAGNOSIS — N184 Chronic kidney disease, stage 4 (severe): Secondary | ICD-10-CM | POA: Diagnosis not present

## 2019-10-02 DIAGNOSIS — E1165 Type 2 diabetes mellitus with hyperglycemia: Secondary | ICD-10-CM | POA: Diagnosis not present

## 2019-10-02 DIAGNOSIS — M1990 Unspecified osteoarthritis, unspecified site: Secondary | ICD-10-CM | POA: Diagnosis not present

## 2019-10-02 DIAGNOSIS — I350 Nonrheumatic aortic (valve) stenosis: Secondary | ICD-10-CM | POA: Diagnosis not present

## 2019-10-04 DIAGNOSIS — Z8673 Personal history of transient ischemic attack (TIA), and cerebral infarction without residual deficits: Secondary | ICD-10-CM | POA: Diagnosis not present

## 2019-10-04 DIAGNOSIS — I350 Nonrheumatic aortic (valve) stenosis: Secondary | ICD-10-CM | POA: Diagnosis not present

## 2019-10-04 DIAGNOSIS — E785 Hyperlipidemia, unspecified: Secondary | ICD-10-CM | POA: Diagnosis not present

## 2019-10-04 DIAGNOSIS — M1990 Unspecified osteoarthritis, unspecified site: Secondary | ICD-10-CM | POA: Diagnosis not present

## 2019-10-04 DIAGNOSIS — E1122 Type 2 diabetes mellitus with diabetic chronic kidney disease: Secondary | ICD-10-CM | POA: Diagnosis not present

## 2019-10-04 DIAGNOSIS — E039 Hypothyroidism, unspecified: Secondary | ICD-10-CM | POA: Diagnosis not present

## 2019-10-04 DIAGNOSIS — Z87891 Personal history of nicotine dependence: Secondary | ICD-10-CM | POA: Diagnosis not present

## 2019-10-04 DIAGNOSIS — N184 Chronic kidney disease, stage 4 (severe): Secondary | ICD-10-CM | POA: Diagnosis not present

## 2019-10-04 DIAGNOSIS — I110 Hypertensive heart disease with heart failure: Secondary | ICD-10-CM | POA: Diagnosis not present

## 2019-10-04 DIAGNOSIS — I5033 Acute on chronic diastolic (congestive) heart failure: Secondary | ICD-10-CM | POA: Diagnosis not present

## 2019-10-04 DIAGNOSIS — E1165 Type 2 diabetes mellitus with hyperglycemia: Secondary | ICD-10-CM | POA: Diagnosis not present

## 2019-10-04 DIAGNOSIS — I252 Old myocardial infarction: Secondary | ICD-10-CM | POA: Diagnosis not present

## 2019-10-04 DIAGNOSIS — I2510 Atherosclerotic heart disease of native coronary artery without angina pectoris: Secondary | ICD-10-CM | POA: Diagnosis not present

## 2019-10-08 DIAGNOSIS — Z8673 Personal history of transient ischemic attack (TIA), and cerebral infarction without residual deficits: Secondary | ICD-10-CM | POA: Diagnosis not present

## 2019-10-08 DIAGNOSIS — I350 Nonrheumatic aortic (valve) stenosis: Secondary | ICD-10-CM | POA: Diagnosis not present

## 2019-10-08 DIAGNOSIS — M1990 Unspecified osteoarthritis, unspecified site: Secondary | ICD-10-CM | POA: Diagnosis not present

## 2019-10-08 DIAGNOSIS — I5033 Acute on chronic diastolic (congestive) heart failure: Secondary | ICD-10-CM | POA: Diagnosis not present

## 2019-10-08 DIAGNOSIS — E785 Hyperlipidemia, unspecified: Secondary | ICD-10-CM | POA: Diagnosis not present

## 2019-10-08 DIAGNOSIS — Z87891 Personal history of nicotine dependence: Secondary | ICD-10-CM | POA: Diagnosis not present

## 2019-10-08 DIAGNOSIS — E1165 Type 2 diabetes mellitus with hyperglycemia: Secondary | ICD-10-CM | POA: Diagnosis not present

## 2019-10-08 DIAGNOSIS — N184 Chronic kidney disease, stage 4 (severe): Secondary | ICD-10-CM | POA: Diagnosis not present

## 2019-10-08 DIAGNOSIS — I252 Old myocardial infarction: Secondary | ICD-10-CM | POA: Diagnosis not present

## 2019-10-08 DIAGNOSIS — E039 Hypothyroidism, unspecified: Secondary | ICD-10-CM | POA: Diagnosis not present

## 2019-10-08 DIAGNOSIS — I110 Hypertensive heart disease with heart failure: Secondary | ICD-10-CM | POA: Diagnosis not present

## 2019-10-08 DIAGNOSIS — E1122 Type 2 diabetes mellitus with diabetic chronic kidney disease: Secondary | ICD-10-CM | POA: Diagnosis not present

## 2019-10-08 DIAGNOSIS — I2510 Atherosclerotic heart disease of native coronary artery without angina pectoris: Secondary | ICD-10-CM | POA: Diagnosis not present

## 2019-10-11 DIAGNOSIS — I5033 Acute on chronic diastolic (congestive) heart failure: Secondary | ICD-10-CM | POA: Diagnosis not present

## 2019-10-11 DIAGNOSIS — E039 Hypothyroidism, unspecified: Secondary | ICD-10-CM | POA: Diagnosis not present

## 2019-10-11 DIAGNOSIS — E1122 Type 2 diabetes mellitus with diabetic chronic kidney disease: Secondary | ICD-10-CM | POA: Diagnosis not present

## 2019-10-11 DIAGNOSIS — N184 Chronic kidney disease, stage 4 (severe): Secondary | ICD-10-CM | POA: Diagnosis not present

## 2019-10-11 DIAGNOSIS — I252 Old myocardial infarction: Secondary | ICD-10-CM | POA: Diagnosis not present

## 2019-10-11 DIAGNOSIS — I2510 Atherosclerotic heart disease of native coronary artery without angina pectoris: Secondary | ICD-10-CM | POA: Diagnosis not present

## 2019-10-11 DIAGNOSIS — I350 Nonrheumatic aortic (valve) stenosis: Secondary | ICD-10-CM | POA: Diagnosis not present

## 2019-10-11 DIAGNOSIS — Z87891 Personal history of nicotine dependence: Secondary | ICD-10-CM | POA: Diagnosis not present

## 2019-10-11 DIAGNOSIS — E1165 Type 2 diabetes mellitus with hyperglycemia: Secondary | ICD-10-CM | POA: Diagnosis not present

## 2019-10-11 DIAGNOSIS — I110 Hypertensive heart disease with heart failure: Secondary | ICD-10-CM | POA: Diagnosis not present

## 2019-10-11 DIAGNOSIS — Z8673 Personal history of transient ischemic attack (TIA), and cerebral infarction without residual deficits: Secondary | ICD-10-CM | POA: Diagnosis not present

## 2019-10-11 DIAGNOSIS — E785 Hyperlipidemia, unspecified: Secondary | ICD-10-CM | POA: Diagnosis not present

## 2019-10-11 DIAGNOSIS — M1990 Unspecified osteoarthritis, unspecified site: Secondary | ICD-10-CM | POA: Diagnosis not present

## 2019-10-14 DIAGNOSIS — E1122 Type 2 diabetes mellitus with diabetic chronic kidney disease: Secondary | ICD-10-CM | POA: Diagnosis not present

## 2019-10-14 DIAGNOSIS — Z87891 Personal history of nicotine dependence: Secondary | ICD-10-CM | POA: Diagnosis not present

## 2019-10-14 DIAGNOSIS — I5033 Acute on chronic diastolic (congestive) heart failure: Secondary | ICD-10-CM | POA: Diagnosis not present

## 2019-10-14 DIAGNOSIS — I252 Old myocardial infarction: Secondary | ICD-10-CM | POA: Diagnosis not present

## 2019-10-14 DIAGNOSIS — E039 Hypothyroidism, unspecified: Secondary | ICD-10-CM | POA: Diagnosis not present

## 2019-10-14 DIAGNOSIS — E785 Hyperlipidemia, unspecified: Secondary | ICD-10-CM | POA: Diagnosis not present

## 2019-10-14 DIAGNOSIS — N184 Chronic kidney disease, stage 4 (severe): Secondary | ICD-10-CM | POA: Diagnosis not present

## 2019-10-14 DIAGNOSIS — I2510 Atherosclerotic heart disease of native coronary artery without angina pectoris: Secondary | ICD-10-CM | POA: Diagnosis not present

## 2019-10-14 DIAGNOSIS — I110 Hypertensive heart disease with heart failure: Secondary | ICD-10-CM | POA: Diagnosis not present

## 2019-10-14 DIAGNOSIS — E1165 Type 2 diabetes mellitus with hyperglycemia: Secondary | ICD-10-CM | POA: Diagnosis not present

## 2019-10-14 DIAGNOSIS — Z8673 Personal history of transient ischemic attack (TIA), and cerebral infarction without residual deficits: Secondary | ICD-10-CM | POA: Diagnosis not present

## 2019-10-14 DIAGNOSIS — I350 Nonrheumatic aortic (valve) stenosis: Secondary | ICD-10-CM | POA: Diagnosis not present

## 2019-10-14 DIAGNOSIS — M1990 Unspecified osteoarthritis, unspecified site: Secondary | ICD-10-CM | POA: Diagnosis not present

## 2019-10-20 ENCOUNTER — Encounter: Payer: Self-pay | Admitting: Family Medicine

## 2019-10-20 ENCOUNTER — Ambulatory Visit (INDEPENDENT_AMBULATORY_CARE_PROVIDER_SITE_OTHER): Payer: Medicare Other | Admitting: Family Medicine

## 2019-10-20 ENCOUNTER — Other Ambulatory Visit: Payer: Self-pay

## 2019-10-20 DIAGNOSIS — I5033 Acute on chronic diastolic (congestive) heart failure: Secondary | ICD-10-CM | POA: Diagnosis not present

## 2019-10-20 NOTE — Progress Notes (Signed)
Virtual Visit via telephone Note  I connected with Michael Trevino on 10/20/19 at 1324 by telephone and verified that I am speaking with the correct person using two identifiers. Michael Trevino is currently located at home and wife are currently with her during visit. The provider, Fransisca Kaufmann Araly Kaas, MD is located in their office at time of visit.  Call ended at 1335  I discussed the limitations, risks, security and privacy concerns of performing an evaluation and management service by telephone and the availability of in person appointments. I also discussed with the patient that there may be a patient responsible charge related to this service. The patient expressed understanding and agreed to proceed.  bs 117, weight 240, bp 140/80 History and Present Illness: patient is calling in for chf and was in from 09/23/2019-09/30/2019 and was up 11 pounds and they diuresed him 11 pounds and he is currently taking 86m bid. Patient is feeling a lot better and denies any cough or congestion.  Patient feels like his breathing is doing well and denies any cough or shortness of breath or wheezing.  He is going to come in tomorrow to do some blood work.  He denies any chest pain or palpitations.  No diagnosis found.  Outpatient Encounter Medications as of 10/20/2019  Medication Sig  . amLODipine (NORVASC) 10 MG tablet Take 1 tablet (10 mg total) by mouth daily.  .Marland Kitchenascorbic acid (VITAMIN C) 500 MG tablet Take 500 mg by mouth daily.  .Marland Kitchenaspirin EC 81 MG tablet Take 81 mg by mouth daily.  .Marland Kitchenatorvastatin (LIPITOR) 40 MG tablet TAKE 1 TABLET BY MOUTH EVERY DAY  . Calcium Carbonate 500 MG CHEW Chew 1 tablet (500 mg total) by mouth 3 (three) times daily as needed. (Patient taking differently: Chew 500 mg by mouth daily. )  . furosemide (LASIX) 80 MG tablet Take 1 tablet (80 mg total) by mouth 2 (two) times daily.  .Marland Kitchenglucose blood test strip Use to check blood glucose once daily.  Dx:  E11.9  . Insulin Detemir  (LEVEMIR FLEXPEN) 100 UNIT/ML Pen Inject 30 Units into the skin at bedtime.  .Marland Kitchenlevothyroxine (SYNTHROID) 150 MCG tablet Take 1 tablet (150 mcg total) by mouth daily before breakfast.  . metoprolol tartrate (LOPRESSOR) 100 MG tablet Take 1 tablet (100 mg total) by mouth 2 (two) times daily.   No facility-administered encounter medications on file as of 10/20/2019.     Review of Systems  Constitutional: Negative for chills and fever.  Respiratory: Negative for chest tightness, shortness of breath and wheezing.   Cardiovascular: Positive for leg swelling. Negative for chest pain.  Musculoskeletal: Negative for back pain and gait problem.  Skin: Negative for rash.  Neurological: Negative for dizziness, weakness and light-headedness.  All other systems reviewed and are negative.   Observations/Objective: Patient sounds comfortable and in no acute distress  Assessment and Plan: Problem List Items Addressed This Visit      Cardiovascular and Mediastinum   Acute on chronic diastolic CHF (congestive heart failure) ([facility] - Primary   Relevant Orders   CBC with Differential/Platelet   CMP14+EGFR       Follow Up Instructions: Follow-up in 1 to 2 months for recheck on CKD and CHF.  Patient is back on his previous dose and it sounds like his weight is down still at least 9 pounds down from where he was when he went into the hospital.  They had diuresed 11 pounds off of them.  I discussed the assessment and treatment plan with the patient. The patient was provided an opportunity to ask questions and all were answered. The patient agreed with the plan and demonstrated an understanding of the instructions.   The patient was advised to call back or seek an in-person evaluation if the symptoms worsen or if the condition fails to improve as anticipated.  The above assessment and management plan was discussed with the patient. The patient verbalized understanding of and has agreed to the  management plan. Patient is aware to call the clinic if symptoms persist or worsen. Patient is aware when to return to the clinic for a follow-up visit. Patient educated on when it is appropriate to go to the emergency department.    I provided 11 minutes of non-face-to-face time during this encounter.    Worthy Rancher, MD

## 2019-10-21 ENCOUNTER — Other Ambulatory Visit: Payer: Self-pay

## 2019-10-21 ENCOUNTER — Other Ambulatory Visit: Payer: Medicare Other

## 2019-10-21 DIAGNOSIS — I5033 Acute on chronic diastolic (congestive) heart failure: Secondary | ICD-10-CM

## 2019-10-22 LAB — CMP14+EGFR
ALT: 17 IU/L (ref 0–44)
AST: 18 IU/L (ref 0–40)
Albumin/Globulin Ratio: 1 — ABNORMAL LOW (ref 1.2–2.2)
Albumin: 3.2 g/dL — ABNORMAL LOW (ref 3.7–4.7)
Alkaline Phosphatase: 109 IU/L (ref 39–117)
BUN/Creatinine Ratio: 12 (ref 10–24)
BUN: 36 mg/dL — ABNORMAL HIGH (ref 8–27)
Bilirubin Total: 0.2 mg/dL (ref 0.0–1.2)
CO2: 17 mmol/L — ABNORMAL LOW (ref 20–29)
Calcium: 7.6 mg/dL — ABNORMAL LOW (ref 8.6–10.2)
Chloride: 108 mmol/L — ABNORMAL HIGH (ref 96–106)
Creatinine, Ser: 2.94 mg/dL — ABNORMAL HIGH (ref 0.76–1.27)
GFR calc Af Amer: 23 mL/min/{1.73_m2} — ABNORMAL LOW (ref >59)
GFR calc non Af Amer: 19 mL/min/{1.73_m2} — ABNORMAL LOW (ref >59)
Globulin, Total: 3.1 g/dL (ref 1.5–4.5)
Glucose: 126 mg/dL — ABNORMAL HIGH (ref 65–99)
Potassium: 4.6 mmol/L (ref 3.5–5.2)
Sodium: 141 mmol/L (ref 134–144)
Total Protein: 6.3 g/dL (ref 6.0–8.5)

## 2019-10-22 LAB — CBC WITH DIFFERENTIAL/PLATELET
Basophils Absolute: 0 10*3/uL (ref 0.0–0.2)
Basos: 0 %
EOS (ABSOLUTE): 0.1 10*3/uL (ref 0.0–0.4)
Eos: 2 %
Hematocrit: 33.8 % — ABNORMAL LOW (ref 37.5–51.0)
Hemoglobin: 11 g/dL — ABNORMAL LOW (ref 13.0–17.7)
Immature Grans (Abs): 0 10*3/uL (ref 0.0–0.1)
Immature Granulocytes: 0 %
Lymphocytes Absolute: 2.9 10*3/uL (ref 0.7–3.1)
Lymphs: 51 %
MCH: 28.8 pg (ref 26.6–33.0)
MCHC: 32.5 g/dL (ref 31.5–35.7)
MCV: 89 fL (ref 79–97)
Monocytes Absolute: 0.4 10*3/uL (ref 0.1–0.9)
Monocytes: 8 %
Neutrophils Absolute: 2.2 10*3/uL (ref 1.4–7.0)
Neutrophils: 39 %
Platelets: 195 10*3/uL (ref 150–450)
RBC: 3.82 x10E6/uL — ABNORMAL LOW (ref 4.14–5.80)
RDW: 14.6 % (ref 11.6–15.4)
WBC: 5.7 10*3/uL (ref 3.4–10.8)

## 2019-10-23 ENCOUNTER — Other Ambulatory Visit: Payer: Self-pay

## 2019-10-23 ENCOUNTER — Ambulatory Visit (INDEPENDENT_AMBULATORY_CARE_PROVIDER_SITE_OTHER): Payer: Medicare Other

## 2019-10-23 DIAGNOSIS — Z87891 Personal history of nicotine dependence: Secondary | ICD-10-CM

## 2019-10-23 DIAGNOSIS — I2510 Atherosclerotic heart disease of native coronary artery without angina pectoris: Secondary | ICD-10-CM | POA: Diagnosis not present

## 2019-10-23 DIAGNOSIS — E1165 Type 2 diabetes mellitus with hyperglycemia: Secondary | ICD-10-CM

## 2019-10-23 DIAGNOSIS — I5033 Acute on chronic diastolic (congestive) heart failure: Secondary | ICD-10-CM

## 2019-10-23 DIAGNOSIS — Z8673 Personal history of transient ischemic attack (TIA), and cerebral infarction without residual deficits: Secondary | ICD-10-CM

## 2019-10-23 DIAGNOSIS — E785 Hyperlipidemia, unspecified: Secondary | ICD-10-CM

## 2019-10-23 DIAGNOSIS — Z794 Long term (current) use of insulin: Secondary | ICD-10-CM

## 2019-10-23 DIAGNOSIS — I252 Old myocardial infarction: Secondary | ICD-10-CM | POA: Diagnosis not present

## 2019-10-23 DIAGNOSIS — I350 Nonrheumatic aortic (valve) stenosis: Secondary | ICD-10-CM

## 2019-10-23 DIAGNOSIS — E039 Hypothyroidism, unspecified: Secondary | ICD-10-CM

## 2019-10-23 DIAGNOSIS — I110 Hypertensive heart disease with heart failure: Secondary | ICD-10-CM

## 2019-10-23 DIAGNOSIS — Z9181 History of falling: Secondary | ICD-10-CM

## 2019-10-23 DIAGNOSIS — E1122 Type 2 diabetes mellitus with diabetic chronic kidney disease: Secondary | ICD-10-CM

## 2019-10-23 DIAGNOSIS — N184 Chronic kidney disease, stage 4 (severe): Secondary | ICD-10-CM

## 2019-10-23 DIAGNOSIS — M1990 Unspecified osteoarthritis, unspecified site: Secondary | ICD-10-CM

## 2019-10-31 DIAGNOSIS — N184 Chronic kidney disease, stage 4 (severe): Secondary | ICD-10-CM | POA: Diagnosis not present

## 2019-10-31 DIAGNOSIS — I129 Hypertensive chronic kidney disease with stage 1 through stage 4 chronic kidney disease, or unspecified chronic kidney disease: Secondary | ICD-10-CM | POA: Diagnosis not present

## 2019-10-31 DIAGNOSIS — N189 Chronic kidney disease, unspecified: Secondary | ICD-10-CM | POA: Diagnosis not present

## 2019-10-31 DIAGNOSIS — N2581 Secondary hyperparathyroidism of renal origin: Secondary | ICD-10-CM | POA: Diagnosis not present

## 2019-10-31 DIAGNOSIS — E1122 Type 2 diabetes mellitus with diabetic chronic kidney disease: Secondary | ICD-10-CM | POA: Diagnosis not present

## 2019-10-31 DIAGNOSIS — D631 Anemia in chronic kidney disease: Secondary | ICD-10-CM | POA: Diagnosis not present

## 2019-11-20 ENCOUNTER — Ambulatory Visit: Payer: Medicare Other | Admitting: Family Medicine

## 2019-11-30 ENCOUNTER — Inpatient Hospital Stay (HOSPITAL_COMMUNITY)
Admission: EM | Admit: 2019-11-30 | Discharge: 2019-12-02 | DRG: 602 | Disposition: A | Discharge status: Home Health | Payer: Medicare Other | Attending: Internal Medicine | Admitting: Internal Medicine

## 2019-11-30 ENCOUNTER — Inpatient Hospital Stay (HOSPITAL_COMMUNITY): Payer: Medicare Other

## 2019-11-30 ENCOUNTER — Other Ambulatory Visit: Payer: Self-pay

## 2019-11-30 ENCOUNTER — Emergency Department (HOSPITAL_COMMUNITY): Payer: Medicare Other

## 2019-11-30 ENCOUNTER — Encounter (HOSPITAL_COMMUNITY): Payer: Self-pay | Admitting: Emergency Medicine

## 2019-11-30 DIAGNOSIS — I499 Cardiac arrhythmia, unspecified: Secondary | ICD-10-CM | POA: Diagnosis not present

## 2019-11-30 DIAGNOSIS — I2510 Atherosclerotic heart disease of native coronary artery without angina pectoris: Secondary | ICD-10-CM | POA: Diagnosis present

## 2019-11-30 DIAGNOSIS — I351 Nonrheumatic aortic (valve) insufficiency: Secondary | ICD-10-CM | POA: Diagnosis present

## 2019-11-30 DIAGNOSIS — Z811 Family history of alcohol abuse and dependence: Secondary | ICD-10-CM | POA: Diagnosis not present

## 2019-11-30 DIAGNOSIS — M7989 Other specified soft tissue disorders: Secondary | ICD-10-CM | POA: Diagnosis not present

## 2019-11-30 DIAGNOSIS — E039 Hypothyroidism, unspecified: Secondary | ICD-10-CM | POA: Diagnosis not present

## 2019-11-30 DIAGNOSIS — Z03818 Encounter for observation for suspected exposure to other biological agents ruled out: Secondary | ICD-10-CM | POA: Diagnosis not present

## 2019-11-30 DIAGNOSIS — R079 Chest pain, unspecified: Secondary | ICD-10-CM | POA: Diagnosis not present

## 2019-11-30 DIAGNOSIS — Z87891 Personal history of nicotine dependence: Secondary | ICD-10-CM | POA: Diagnosis not present

## 2019-11-30 DIAGNOSIS — Z841 Family history of disorders of kidney and ureter: Secondary | ICD-10-CM | POA: Diagnosis not present

## 2019-11-30 DIAGNOSIS — N184 Chronic kidney disease, stage 4 (severe): Secondary | ICD-10-CM | POA: Diagnosis not present

## 2019-11-30 DIAGNOSIS — E1122 Type 2 diabetes mellitus with diabetic chronic kidney disease: Secondary | ICD-10-CM | POA: Diagnosis not present

## 2019-11-30 DIAGNOSIS — Z550 Illiteracy and low-level literacy: Secondary | ICD-10-CM

## 2019-11-30 DIAGNOSIS — L02419 Cutaneous abscess of limb, unspecified: Secondary | ICD-10-CM

## 2019-11-30 DIAGNOSIS — Z823 Family history of stroke: Secondary | ICD-10-CM

## 2019-11-30 DIAGNOSIS — J189 Pneumonia, unspecified organism: Secondary | ICD-10-CM | POA: Diagnosis not present

## 2019-11-30 DIAGNOSIS — Z8249 Family history of ischemic heart disease and other diseases of the circulatory system: Secondary | ICD-10-CM

## 2019-11-30 DIAGNOSIS — L03119 Cellulitis of unspecified part of limb: Secondary | ICD-10-CM

## 2019-11-30 DIAGNOSIS — Z20822 Contact with and (suspected) exposure to covid-19: Secondary | ICD-10-CM | POA: Diagnosis not present

## 2019-11-30 DIAGNOSIS — R0689 Other abnormalities of breathing: Secondary | ICD-10-CM | POA: Diagnosis not present

## 2019-11-30 DIAGNOSIS — R0602 Shortness of breath: Secondary | ICD-10-CM | POA: Diagnosis not present

## 2019-11-30 DIAGNOSIS — M1990 Unspecified osteoarthritis, unspecified site: Secondary | ICD-10-CM | POA: Diagnosis not present

## 2019-11-30 DIAGNOSIS — I10 Essential (primary) hypertension: Secondary | ICD-10-CM | POA: Diagnosis present

## 2019-11-30 DIAGNOSIS — L03116 Cellulitis of left lower limb: Secondary | ICD-10-CM | POA: Diagnosis not present

## 2019-11-30 DIAGNOSIS — Z794 Long term (current) use of insulin: Secondary | ICD-10-CM

## 2019-11-30 DIAGNOSIS — Z8673 Personal history of transient ischemic attack (TIA), and cerebral infarction without residual deficits: Secondary | ICD-10-CM | POA: Diagnosis not present

## 2019-11-30 DIAGNOSIS — Z79890 Hormone replacement therapy (postmenopausal): Secondary | ICD-10-CM | POA: Diagnosis not present

## 2019-11-30 DIAGNOSIS — I5033 Acute on chronic diastolic (congestive) heart failure: Secondary | ICD-10-CM | POA: Diagnosis present

## 2019-11-30 DIAGNOSIS — M79605 Pain in left leg: Secondary | ICD-10-CM | POA: Diagnosis not present

## 2019-11-30 DIAGNOSIS — I130 Hypertensive heart and chronic kidney disease with heart failure and stage 1 through stage 4 chronic kidney disease, or unspecified chronic kidney disease: Secondary | ICD-10-CM | POA: Diagnosis not present

## 2019-11-30 DIAGNOSIS — I350 Nonrheumatic aortic (valve) stenosis: Secondary | ICD-10-CM | POA: Diagnosis not present

## 2019-11-30 DIAGNOSIS — Z79899 Other long term (current) drug therapy: Secondary | ICD-10-CM | POA: Diagnosis not present

## 2019-11-30 DIAGNOSIS — I252 Old myocardial infarction: Secondary | ICD-10-CM | POA: Diagnosis not present

## 2019-11-30 DIAGNOSIS — I444 Left anterior fascicular block: Secondary | ICD-10-CM | POA: Diagnosis not present

## 2019-11-30 DIAGNOSIS — Z833 Family history of diabetes mellitus: Secondary | ICD-10-CM | POA: Diagnosis not present

## 2019-11-30 DIAGNOSIS — Z7982 Long term (current) use of aspirin: Secondary | ICD-10-CM

## 2019-11-30 DIAGNOSIS — D631 Anemia in chronic kidney disease: Secondary | ICD-10-CM | POA: Diagnosis present

## 2019-11-30 DIAGNOSIS — R609 Edema, unspecified: Secondary | ICD-10-CM | POA: Diagnosis not present

## 2019-11-30 DIAGNOSIS — Z888 Allergy status to other drugs, medicaments and biological substances status: Secondary | ICD-10-CM | POA: Diagnosis not present

## 2019-11-30 DIAGNOSIS — Z743 Need for continuous supervision: Secondary | ICD-10-CM | POA: Diagnosis not present

## 2019-11-30 DIAGNOSIS — E782 Mixed hyperlipidemia: Secondary | ICD-10-CM | POA: Diagnosis present

## 2019-11-30 DIAGNOSIS — L02416 Cutaneous abscess of left lower limb: Secondary | ICD-10-CM | POA: Diagnosis present

## 2019-11-30 DIAGNOSIS — R2242 Localized swelling, mass and lump, left lower limb: Secondary | ICD-10-CM | POA: Diagnosis not present

## 2019-11-30 LAB — URINALYSIS, ROUTINE W REFLEX MICROSCOPIC
Bilirubin Urine: NEGATIVE
Glucose, UA: >=500 mg/dL — AB
Ketones, ur: NEGATIVE mg/dL
Leukocytes,Ua: NEGATIVE
Nitrite: NEGATIVE
Protein, ur: >=300 mg/dL — AB
Specific Gravity, Urine: 1.01 (ref 1.005–1.030)
pH: 5 (ref 5.0–8.0)

## 2019-11-30 LAB — BASIC METABOLIC PANEL
Anion gap: 9 (ref 5–15)
BUN: 40 mg/dL — ABNORMAL HIGH (ref 8–23)
CO2: 22 mmol/L (ref 22–32)
Calcium: 7.2 mg/dL — ABNORMAL LOW (ref 8.9–10.3)
Chloride: 109 mmol/L (ref 98–111)
Creatinine, Ser: 3.51 mg/dL — ABNORMAL HIGH (ref 0.61–1.24)
GFR calc Af Amer: 18 mL/min — ABNORMAL LOW (ref >60)
GFR calc non Af Amer: 16 mL/min — ABNORMAL LOW (ref >60)
Glucose, Bld: 168 mg/dL — ABNORMAL HIGH (ref 70–99)
Potassium: 4 mmol/L (ref 3.5–5.1)
Sodium: 140 mmol/L (ref 135–145)

## 2019-11-30 LAB — CBG MONITORING, ED: Glucose-Capillary: 151 mg/dL — ABNORMAL HIGH (ref 70–99)

## 2019-11-30 LAB — CBC
HCT: 35 % — ABNORMAL LOW (ref 39.0–52.0)
Hemoglobin: 11.3 g/dL — ABNORMAL LOW (ref 13.0–17.0)
MCH: 28.6 pg (ref 26.0–34.0)
MCHC: 32.3 g/dL (ref 30.0–36.0)
MCV: 88.6 fL (ref 80.0–100.0)
Platelets: 215 10*3/uL (ref 150–400)
RBC: 3.95 MIL/uL — ABNORMAL LOW (ref 4.22–5.81)
RDW: 14.4 % (ref 11.5–15.5)
WBC: 15.5 10*3/uL — ABNORMAL HIGH (ref 4.0–10.5)
nRBC: 0 % (ref 0.0–0.2)

## 2019-11-30 LAB — GLUCOSE, CAPILLARY: Glucose-Capillary: 198 mg/dL — ABNORMAL HIGH (ref 70–99)

## 2019-11-30 LAB — LACTIC ACID, PLASMA: Lactic Acid, Venous: 1.5 mmol/L (ref 0.5–1.9)

## 2019-11-30 LAB — BRAIN NATRIURETIC PEPTIDE: B Natriuretic Peptide: 958 pg/mL — ABNORMAL HIGH (ref 0.0–100.0)

## 2019-11-30 MED ORDER — ALBUTEROL SULFATE (2.5 MG/3ML) 0.083% IN NEBU: 2.5000 mg | INHALATION_SOLUTION | RESPIRATORY_TRACT | Status: DC | PRN

## 2019-11-30 MED ORDER — ASPIRIN EC 81 MG PO TBEC
81.0000 mg | DELAYED_RELEASE_TABLET | Freq: Every day | ORAL | Status: DC
  Administered 2019-11-30 – 2019-12-02 (×3): 81 mg via ORAL
  Filled 2019-11-30 (×3): qty 1

## 2019-11-30 MED ORDER — SODIUM CHLORIDE 0.9% FLUSH: 3.0000 mL | INTRAVENOUS | Status: DC | PRN

## 2019-11-30 MED ORDER — INSULIN DETEMIR 100 UNIT/ML ~~LOC~~ SOLN
30.0000 [IU] | Freq: Every day | SUBCUTANEOUS | Status: DC
  Administered 2019-11-30 – 2019-12-01 (×2): 30 [IU] via SUBCUTANEOUS
  Filled 2019-11-30 (×3): qty 0.3

## 2019-11-30 MED ORDER — ALBUTEROL SULFATE HFA 108 (90 BASE) MCG/ACT IN AERS
2.0000 | INHALATION_SPRAY | Freq: Three times a day (TID) | RESPIRATORY_TRACT | Status: DC
  Administered 2019-11-30 – 2019-12-01 (×3): 2 via RESPIRATORY_TRACT
  Filled 2019-11-30: qty 6.7

## 2019-11-30 MED ORDER — SODIUM CHLORIDE 0.9 % IV SOLN
500.0000 mg | INTRAVENOUS | Status: DC
  Administered 2019-11-30: 500 mg via INTRAVENOUS
  Filled 2019-11-30: qty 500

## 2019-11-30 MED ORDER — SODIUM CHLORIDE 0.9% FLUSH
3.0000 mL | Freq: Two times a day (BID) | INTRAVENOUS | Status: DC
  Administered 2019-11-30 – 2019-12-02 (×5): 3 mL via INTRAVENOUS

## 2019-11-30 MED ORDER — FUROSEMIDE 10 MG/ML IJ SOLN
80.0000 mg | Freq: Two times a day (BID) | INTRAMUSCULAR | Status: DC
  Administered 2019-11-30 – 2019-12-01 (×3): 80 mg via INTRAVENOUS
  Filled 2019-11-30 (×3): qty 8

## 2019-11-30 MED ORDER — ACETAMINOPHEN 325 MG PO TABS
650.0000 mg | ORAL_TABLET | Freq: Four times a day (QID) | ORAL | Status: DC | PRN
  Administered 2019-11-30: 650 mg via ORAL
  Filled 2019-11-30: qty 2

## 2019-11-30 MED ORDER — SODIUM CHLORIDE 0.9 % IV SOLN: 250.0000 mL | INTRAVENOUS | Status: DC | PRN

## 2019-11-30 MED ORDER — GUAIFENESIN ER 600 MG PO TB12
600.0000 mg | ORAL_TABLET | Freq: Two times a day (BID) | ORAL | Status: DC
  Administered 2019-11-30 – 2019-12-02 (×4): 600 mg via ORAL
  Filled 2019-11-30 (×5): qty 1

## 2019-11-30 MED ORDER — ATORVASTATIN CALCIUM 40 MG PO TABS
40.0000 mg | ORAL_TABLET | Freq: Every day | ORAL | Status: DC
  Administered 2019-11-30: 40 mg via ORAL

## 2019-11-30 MED ORDER — POLYETHYLENE GLYCOL 3350 17 G PO PACK: 17.0000 g | PACK | Freq: Every day | ORAL | Status: DC | PRN

## 2019-11-30 MED ORDER — ONDANSETRON HCL 4 MG/2ML IJ SOLN: 4.0000 mg | Freq: Four times a day (QID) | INTRAMUSCULAR | Status: DC | PRN

## 2019-11-30 MED ORDER — INSULIN ASPART 100 UNIT/ML ~~LOC~~ SOLN
0.0000 [IU] | Freq: Three times a day (TID) | SUBCUTANEOUS | Status: DC
  Administered 2019-12-01: 2 [IU] via SUBCUTANEOUS
  Administered 2019-12-01 (×2): 3 [IU] via SUBCUTANEOUS
  Administered 2019-12-02: 5 [IU] via SUBCUTANEOUS
  Administered 2019-12-02: 2 [IU] via SUBCUTANEOUS

## 2019-11-30 MED ORDER — LABETALOL HCL 5 MG/ML IV SOLN: 10.0000 mg | INTRAVENOUS | Status: DC | PRN

## 2019-11-30 MED ORDER — LEVOTHYROXINE SODIUM 75 MCG PO TABS
150.0000 ug | ORAL_TABLET | Freq: Every day | ORAL | Status: DC
  Administered 2019-12-01 – 2019-12-02 (×2): 150 ug via ORAL
  Filled 2019-11-30 (×2): qty 2

## 2019-11-30 MED ORDER — AMLODIPINE BESYLATE 5 MG PO TABS
10.0000 mg | ORAL_TABLET | Freq: Every day | ORAL | Status: DC
  Administered 2019-12-01 – 2019-12-02 (×2): 10 mg via ORAL
  Filled 2019-11-30 (×2): qty 2

## 2019-11-30 MED ORDER — ACETAMINOPHEN 650 MG RE SUPP: 650.0000 mg | Freq: Four times a day (QID) | RECTAL | Status: DC | PRN

## 2019-11-30 MED ORDER — VANCOMYCIN HCL IN DEXTROSE 1-5 GM/200ML-% IV SOLN
1000.0000 mg | Freq: Once | INTRAVENOUS | Status: AC
  Administered 2019-11-30: 1000 mg via INTRAVENOUS
  Filled 2019-11-30: qty 200

## 2019-11-30 MED ORDER — SODIUM CHLORIDE 0.9% FLUSH
3.0000 mL | Freq: Once | INTRAVENOUS | Status: AC
  Administered 2019-11-30: 3 mL via INTRAVENOUS

## 2019-11-30 MED ORDER — METOPROLOL TARTRATE 50 MG PO TABS
100.0000 mg | ORAL_TABLET | Freq: Two times a day (BID) | ORAL | Status: DC
  Administered 2019-11-30 – 2019-12-02 (×4): 100 mg via ORAL
  Filled 2019-11-30: qty 2
  Filled 2019-11-30: qty 4
  Filled 2019-11-30 (×2): qty 2

## 2019-11-30 MED ORDER — HEPARIN SODIUM (PORCINE) 5000 UNIT/ML IJ SOLN
5000.0000 [IU] | Freq: Three times a day (TID) | INTRAMUSCULAR | Status: DC
  Administered 2019-11-30 – 2019-12-02 (×6): 5000 [IU] via SUBCUTANEOUS
  Filled 2019-11-30 (×6): qty 1

## 2019-11-30 MED ORDER — ONDANSETRON HCL 4 MG PO TABS: 4.0000 mg | ORAL_TABLET | Freq: Four times a day (QID) | ORAL | Status: DC | PRN

## 2019-11-30 MED ORDER — ATORVASTATIN CALCIUM 40 MG PO TABS
40.0000 mg | ORAL_TABLET | Freq: Every day | ORAL | Status: DC
  Administered 2019-12-01: 40 mg via ORAL
  Filled 2019-11-30 (×2): qty 1

## 2019-11-30 MED ORDER — INSULIN ASPART 100 UNIT/ML ~~LOC~~ SOLN: 0.0000 [IU] | Freq: Every day | SUBCUTANEOUS | Status: DC

## 2019-11-30 MED ORDER — TRAZODONE HCL 50 MG PO TABS
50.0000 mg | ORAL_TABLET | Freq: Every evening | ORAL | Status: DC | PRN
  Administered 2019-11-30: 50 mg via ORAL
  Filled 2019-11-30: qty 1

## 2019-11-30 MED ORDER — SODIUM CHLORIDE 0.9 % IV SOLN
2.0000 g | INTRAVENOUS | Status: DC
  Administered 2019-11-30 – 2019-12-01 (×2): 2 g via INTRAVENOUS
  Filled 2019-11-30 (×2): qty 20

## 2019-11-30 MED ORDER — ASCORBIC ACID 500 MG PO TABS
500.0000 mg | ORAL_TABLET | Freq: Every day | ORAL | Status: DC
  Administered 2019-11-30 – 2019-12-02 (×3): 500 mg via ORAL
  Filled 2019-11-30 (×3): qty 1

## 2019-11-30 NOTE — ED Notes (Signed)
Spoke with Mrs Rusek [phone]

## 2019-11-30 NOTE — ED Notes (Signed)
Pt washed, and linens changed due to soiled

## 2019-11-30 NOTE — ED Provider Notes (Signed)
[facility] EMERGENCY DEPARTMENT Provider Note   CSN: [id number] Arrival date & time: 11/30/19  1053     History Chief Complaint  Patient presents with  . end stage renal    Dalmer Dolinski is a 79 y.o. male.  Patient presents with left leg pain redness.  Patient has a history of heart failure with swelling in both legs  The history is provided by the patient. No language interpreter was used.  Leg Pain Location:  Leg Injury: no   Leg location:  L leg Pain details:    Quality:  Aching   Radiates to:  Does not radiate   Severity:  Moderate   Onset quality:  Sudden   Timing:  Constant   Progression:  Worsening Associated symptoms: no back pain and no fatigue        Past Medical History:  Diagnosis Date  . Arthritis   . Diabetes mellitus without complication ([place])   . Heart attack ([place])   . Hyperlipidemia   . Hypertension   . MI (myocardial infarction) ([place]) 2000  . Renal disorder   . Sepsis ([place])   . Sepsis due to Streptococcus, group B ([place]) 02/24/2013  . Stroke ([place])   . Thyroid disease    hypothyroid    Patient Active Problem List   Diagnosis Date Noted  . Cellulitis and abscess of lower extremity 11/30/2019  . Acute on chronic diastolic CHF (congestive heart failure) ([place]) 09/30/2019  . Fluid overload 09/24/2019  . CKD stage 4 due to type 2 diabetes mellitus ([place]) 01/09/2018  . Essential hypertension, benign 09/11/2017  . Class 2 severe obesity due to excess calories with serious comorbidity and body mass index (BMI) of 37.0 to 37.9 in adult ([place]) 09/11/2017  . Nonrheumatic aortic valve insufficiency 05/03/2017  . Uncontrolled type 2 diabetes mellitus with stage 3 chronic kidney disease ([place]) 05/03/2017  . Arthritis   . Erectile dysfunction 04/11/2013  . Mixed hyperlipidemia 04/11/2013  . Hypothyroidism 02/08/2011  . Illiterate 02/08/2011  . CAD (coronary artery disease) 02/08/2011    Past Surgical History:  Procedure Laterality Date  . TEE WITHOUT  CARDIOVERSION N/A 02/26/2013   Procedure: TRANSESOPHAGEAL ECHOCARDIOGRAM (TEE);  Surgeon: Thayer Headings, MD;  Location: [facility];  Service: Cardiovascular;  Laterality: N/A;       Family History  Problem Relation Age of Onset  . Diabetes Mother   . Hypertension Mother   . Cancer Father   . Stroke Brother   . Alcohol abuse Brother   . Diabetes Brother   . Diabetes Sister   . Diabetes Brother 13       TYPE 1  . Kidney disease Brother 19       DIALYSIS  . Heart attack Brother   . Healthy Daughter   . Healthy Son   . Healthy Daughter   . Healthy Son   . Healthy Son   . Healthy Son     Social History   Tobacco Use  . Smoking status: Former Smoker    Packs/day: 0.50    Years: 30.00    Pack years: 15.00    Types: Cigarettes    Quit date: 11/05/1984    Years since quitting: 35.0  . Smokeless tobacco: Never Used  Substance Use Topics  . Alcohol use: No  . Drug use: No    Home Medications Prior to Admission medications   Medication Sig Start Date End Date Taking? Authorizing Provider  amLODipine (NORVASC) 10 MG tablet Take 1 tablet (  10 mg total) by mouth daily. 06/26/19   Dettinger, Fransisca Kaufmann, MD  ascorbic acid (VITAMIN C) 500 MG tablet Take 500 mg by mouth daily.    [provider]  aspirin EC 81 MG tablet Take 81 mg by mouth daily.    [provider]  atorvastatin (LIPITOR) 40 MG tablet TAKE 1 TABLET BY MOUTH EVERY DAY 04/11/19   Dettinger, Fransisca Kaufmann, MD  Calcium Carbonate 500 MG CHEW Chew 1 tablet (500 mg total) by mouth 3 (three) times daily as needed. Patient taking differently: Chew 500 mg by mouth daily.  05/01/16   Cherre Robins, PharmD  furosemide (LASIX) 80 MG tablet Take 1 tablet (80 mg total) by mouth 2 (two) times daily. 04/11/19   Dettinger, Fransisca Kaufmann, MD  glucose blood test strip Use to check blood glucose once daily.  Dx:  E11.9 07/14/15   Wardell Honour, MD  Insulin Detemir (LEVEMIR FLEXPEN) 100 UNIT/ML Pen Inject 30 Units into the skin  at bedtime. 04/11/19   Dettinger, Fransisca Kaufmann, MD  levothyroxine (SYNTHROID) 150 MCG tablet Take 1 tablet (150 mcg total) by mouth daily before breakfast. 04/11/19   Dettinger, Fransisca Kaufmann, MD  metoprolol tartrate (LOPRESSOR) 100 MG tablet Take 1 tablet (100 mg total) by mouth 2 (two) times daily. 06/26/19   Dettinger, Fransisca Kaufmann, MD    Allergies    Zocor [simvastatin - high dose]  Review of Systems   Review of Systems  Constitutional: Negative for appetite change and fatigue.  HENT: Negative for congestion, ear discharge and sinus pressure.   Eyes: Negative for discharge.  Respiratory: Negative for cough.   Cardiovascular: Negative for chest pain.  Gastrointestinal: Negative for abdominal pain and diarrhea.  Genitourinary: Negative for frequency and hematuria.  Musculoskeletal: Negative for back pain.       Swelling tenderness redness to left leg  Skin: Negative for rash.  Neurological: Negative for seizures and headaches.  Psychiatric/Behavioral: Negative for hallucinations.    Physical Exam Updated Vital Signs BP (!) 163/53   Pulse 84   Temp 98.4 F (36.9 C) (Oral)   Resp (!) 22   Ht 5\' 9"  (1.753 m)   Wt 110.2 kg   SpO2 97%   BMI 35.88 kg/m   Physical Exam Vitals and nursing note reviewed.  Constitutional:      Appearance: He is well-developed.  HENT:     Head: Normocephalic.     Nose: Nose normal.  Eyes:     General: No scleral icterus.    Conjunctiva/sclera: Conjunctivae normal.  Neck:     Thyroid: No thyromegaly.  Cardiovascular:     Rate and Rhythm: Normal rate and regular rhythm.     Heart sounds: No murmur. No friction rub. No gallop.   Pulmonary:     Breath sounds: No stridor. No wheezing or rales.  Chest:     Chest wall: No tenderness.  Abdominal:     General: There is no distension.     Tenderness: There is no abdominal tenderness. There is no rebound.  Musculoskeletal:        General: Normal range of motion.     Cervical back: Neck supple.     Comments:  Significant edema to both legs with redness and tenderness to left leg  Lymphadenopathy:     Cervical: No cervical adenopathy.  Skin:    Findings: No erythema or rash.  Neurological:     Mental Status: He is alert and oriented to person, place, and time.  Motor: No abnormal muscle tone.     Coordination: Coordination normal.  Psychiatric:        Behavior: Behavior normal.     ED Results / Procedures / Treatments   Labs (all labs ordered are listed, but only abnormal results are displayed) Labs Reviewed  BASIC METABOLIC PANEL - Abnormal; Notable for the following components:      Result Value   Glucose, Bld 168 (*)    BUN 40 (*)    Creatinine, Ser 3.51 (*)    Calcium 7.2 (*)    GFR calc non Af Amer 16 (*)    GFR calc Af Amer 18 (*)    All other components within normal limits  CBC - Abnormal; Notable for the following components:   WBC 15.5 (*)    RBC 3.95 (*)    Hemoglobin 11.3 (*)    HCT 35.0 (*)    All other components within normal limits  BRAIN NATRIURETIC PEPTIDE - Abnormal; Notable for the following components:   B Natriuretic Peptide 958.0 (*)    All other components within normal limits  URINALYSIS, ROUTINE W REFLEX MICROSCOPIC - Abnormal; Notable for the following components:   APPearance HAZY (*)    Glucose, UA >=500 (*)    Hgb urine dipstick SMALL (*)    Protein, ur >=300 (*)    Bacteria, UA FEW (*)    All other components within normal limits  SARS CORONAVIRUS 2 (TAT 6-24 HRS)  LACTIC ACID, PLASMA    EKG None  Radiology US Venous Img Lower Unilateral Left  Result Date: 11/30/2019 CLINICAL DATA:  Left lower extremity pain and swelling for 2 days. EXAM: Left LOWER EXTREMITY VENOUS DOPPLER ULTRASOUND TECHNIQUE: Gray-scale sonography with compression, as well as color and duplex ultrasound, were performed to evaluate the deep venous system(s) from the level of the common femoral vein through the popliteal and proximal calf veins. COMPARISON:  None  FINDINGS: VENOUS Normal compressibility of the common femoral, superficial femoral, and popliteal veins, as well as the visualized calf veins. Visualized portions of profunda femoral vein and great saphenous vein unremarkable. No filling defects to suggest DVT on grayscale or color Doppler imaging. Doppler waveforms show normal direction of venous flow, normal respiratory phasicity and response to augmentation. Limited views of the contralateral common femoral vein are unremarkable. OTHER None Limitations: none IMPRESSION: No femoropopliteal DVT nor evidence of DVT within the visualized calf veins. If clinical symptoms are inconsistent or if there are persistent or worsening symptoms, further imaging (possibly involving the iliac veins) may be warranted. Electronically Signed   By: Marijo Sanes M.D.   On: 11/30/2019 13:18    Procedures Procedures (including critical care time)  Medications Ordered in ED Medications  sodium chloride flush (NS) 0.9 % injection 3 mL (3 mLs Intravenous Given 11/30/19 1120)  vancomycin (VANCOCIN) IVPB 1000 mg/200 mL premix (0 mg Intravenous Stopped 11/30/19 1258)    ED Course  I have reviewed the triage vital signs and the nursing notes.  Pertinent labs & imaging results that were available during my care of the patient were reviewed by me and considered in my medical decision making (see chart for details).    MDM Rules/Calculators/A&P                      DVT study of left leg negative.  White count 15,000.  Suspect cellulitis.  He will be admitted for IV antibiotics Final Clinical Impression(s) / ED Diagnoses Final diagnoses:  Cellulitis of left leg    Rx / DC Orders ED Discharge Orders    None       Milton Ferguson, MD 11/30/19 1346

## 2019-11-30 NOTE — ED Triage Notes (Signed)
Pt admitted at [facility] one month ago for end stage renal   Has had increasing swelling over the last several days   Has appt 1/21 with neph to talk about dialysis  Short of breath "because of this mask"

## 2019-11-30 NOTE — ED Notes (Signed)
covid spec in lab 

## 2019-11-30 NOTE — ED Notes (Signed)
Call to [phone] Charma Igo to give update

## 2019-11-30 NOTE — ED Notes (Signed)
[place] to room

## 2019-11-30 NOTE — H&P (Signed)
Patient Demographics:    Michael Trevino, is a 79 y.o. male  MRN: [id number]   DOB - 11-02-1941  Admit Date - 11/30/2019  Outpatient Primary MD for the patient is Dettinger, Fransisca Kaufmann, MD   Assessment & Plan:    Principal Problem:   Cellulitis and abscess of lower extremity Active Problems:   Lt Sided Pneumonia   Hypothyroidism   Illiterate   CAD (coronary artery disease)   Essential hypertension, benign   CKD stage 4 due to type 2 diabetes mellitus ([place])   A/p 1) left lower extremity cellulitis--- leukocytosis with white count of 15.5 noted no lactic acidosis, venous Dopplers negative for acute DVT,-IV Rocephin as ordered , elevate lower extremity  2)Left-sided community-acquired pneumonia--- treat empirically with IV Rocephin and azithromycin, leukocytosis noted, dyspnea noted, mucolytics and bronchodilators as ordered -No significant hypoxia at this time, tachypnea noted -COVID-19 testing pending  3)HFpEF-----Fluid overload possibly secondary to acute on 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  Denies any stent placement. Continue aspirin and Lipitor as well as metoprolol  7)Hypertension: BP not at goal, continue amlodipine 10 mg daily,, metoprolol 100 mg twice daily, -- may use IV labetalol when necessary  Every 4 hours for systolic blood pressure over 160 mmhg  8)Diabetes mellitus type 2: Last A1c 7.4 on 07/03/2019 Continue Levemir insulin 30 units nightly along with sliding scale coverage  9)Hypothyroidism: Continue levothyroxine 150 mcg daily  With History of - Reviewed by me  Past Medical History:  Diagnosis Date  . Arthritis   . Diabetes mellitus without complication ([place])   . Heart attack ([place])   . Hyperlipidemia   . Hypertension   . MI (myocardial infarction) ([place]) 2000  . Renal disorder   . Sepsis ([place])   . Sepsis due to Streptococcus, group B ([place]) 02/24/2013  . Stroke ([place])   . Thyroid disease    hypothyroid      Past Surgical History:  Procedure Laterality Date  . TEE WITHOUT CARDIOVERSION N/A 02/26/2013   Procedure: TRANSESOPHAGEAL ECHOCARDIOGRAM (TEE);  Surgeon: Thayer Headings, MD;  Location: [facility];  Service: Cardiovascular;  Laterality:  N/A;      Chief Complaint  Patient presents with  . end stage renal      HPI:    Michael Trevino  is a 79 y.o. male   with medical history significant of DM2, HLD, HTN, CKD IV, CHF,  CAD/Prior MI, hypothyroidism and history of CVA to the ED with increased swelling and discomfort left lower extremity despite diuretic use as well as shortness of breath - No fever  Or chills , no sick contacts at home, no vomiting no diarrhea -No pleuritic symptoms In ED BP is elevated and patient is tachypneic - Dopplers negative for DVT of left lower extremity, chest x-ray with possible left-sided pneumonia -UA with glucosuria and proteinuria as well as bacteria on microscopy -BNP elevated at 958 from 297 a couple  months ago -WBC is 15.5, lactic acid is 1.5 -Creatinine is up to 3.5 from a baseline usually around 3, potassium is 4.0 and sodium is 140 with a bicarb of 22 -EDP requested hospitalization for possible left lower extremity cellulitis and possible left-sided pneumonia  Review of systems:    In addition to the HPI above,   A full Review of  Systems was done, all other systems reviewed are negative except as noted above in HPI , .    Social History:  Reviewed by me    Social History   Tobacco Use  . Smoking status: Former Smoker    Packs/day: 0.50    Years: 30.00    Pack years: 15.00    Types: Cigarettes    Quit date: 11/05/1984    Years since quitting: 35.0  . Smokeless tobacco: Never Used  Substance Use Topics  . Alcohol use: No       Family History :  Reviewed by me    Family History  Problem Relation Age of Onset  . Diabetes Mother   . Hypertension Mother   . Cancer Father   . Stroke Brother   . Alcohol abuse Brother   . Diabetes Brother   . Diabetes Sister   . Diabetes Brother 13       TYPE 1  . Kidney disease Brother 64       DIALYSIS  . Heart attack Brother   . Healthy Daughter   . Healthy Son   . Healthy Daughter   . Healthy Son   . Healthy Son   . Healthy Son      Home Medications:   Prior to Admission medications   Medication Sig Start Date End Date Taking? Authorizing Provider  amLODipine (NORVASC) 10 MG tablet Take 1 tablet (10 mg total) by mouth daily. 06/26/19  Yes Dettinger, Fransisca Kaufmann, MD  ascorbic acid (VITAMIN C) 500 MG tablet Take 500 mg by mouth daily.   Yes [provider]  aspirin EC 81 MG tablet Take 81 mg by mouth daily.   Yes [provider]  atorvastatin (LIPITOR) 40 MG tablet TAKE 1 TABLET BY MOUTH EVERY DAY 04/11/19  Yes Dettinger, Fransisca Kaufmann, MD  Calcium Carbonate 500 MG CHEW Chew 1 tablet (500 mg total) by mouth 3 (three) times daily as needed. Patient taking differently: Chew 500 mg by mouth daily.  05/01/16   Yes Cherre Robins, PharmD  furosemide (LASIX) 80 MG tablet Take 1 tablet (80 mg total) by mouth 2 (two) times daily. 04/11/19  Yes Dettinger, Fransisca Kaufmann, MD  glucose blood test strip Use to check blood glucose once daily.  Dx:  E11.9 07/14/15  Yes Sabra Heck,  Lillette Boxer, MD  Insulin Detemir (LEVEMIR FLEXPEN) 100 UNIT/ML Pen Inject 30 Units into the skin at bedtime. 04/11/19  Yes Dettinger, Fransisca Kaufmann, MD  levothyroxine (SYNTHROID) 150 MCG tablet Take 1 tablet (150 mcg total) by mouth daily before breakfast. 04/11/19  Yes Dettinger, Fransisca Kaufmann, MD  metoprolol tartrate (LOPRESSOR) 100 MG tablet Take 1 tablet (100 mg total) by mouth 2 (two) times daily. 06/26/19  Yes Dettinger, Fransisca Kaufmann, MD     Allergies:     Allergies  Allergen Reactions  . Zocor [Simvastatin - High Dose] Nausea Only    Unknown     Physical Exam:   Vitals  Blood pressure (!) 184/75, pulse 96, temperature 98.4 F (36.9 C), temperature source Oral, resp. rate (!) 21, height 5\' 9"  (1.753 m), weight 110.2 kg, SpO2 96 %.  Physical Examination: General appearance - alert, well appearing, and in no distress and  Mental status - alert, oriented to person, place, and time,  Eyes - sclera anicteric Neck - supple, no JVD elevation , Chest -diminished on the left with few scattered rhonchi, no wheezing Heart - S1 and S2 normal, regular  Abdomen - soft, nontender, nondistended, no masses or organomegaly Neurological - screening mental status exam normal, neck supple without rigidity, cranial nerves II through XII intact, DTR's normal and symmetric Extremities - +ve  pedal edema noted, intact peripheral pulses  Skin -left lower extremity swelling, warmth and erythema consistent with cellulitis, no open draining wounds, no significant streaking     Data Review:    CBC Recent Labs  Lab 11/30/19 1106  WBC 15.5*  HGB 11.3*  HCT 35.0*  PLT 215  MCV 88.6  MCH 28.6  MCHC 32.3  RDW 14.4    ------------------------------------------------------------------------------------------------------------------  Chemistries  Recent Labs  Lab 11/30/19 1106  NA 140  K 4.0  CL 109  CO2 22  GLUCOSE 168*  BUN 40*  CREATININE 3.51*  CALCIUM 7.2*   ------------------------------------------------------------------------------------------------------------------ estimated creatinine clearance is 21.2 mL/min (A) (by C-G formula based on SCr of 3.51 mg/dL (H)). ------------------------------------------------------------------------------------------------------------------ No results for input(s): TSH, T4TOTAL, T3FREE, THYROIDAB in the last 72 hours.  Invalid input(s): FREET3   Coagulation profile No results for input(s): INR, PROTIME in the last 168 hours. ------------------------------------------------------------------------------------------------------------------- No results for input(s): DDIMER in the last 72 hours. -------------------------------------------------------------------------------------------------------------------  Cardiac Enzymes No results for input(s): CKMB, TROPONINI, MYOGLOBIN in the last 168 hours.  Invalid input(s): CK ------------------------------------------------------------------------------------------------------------------    Component Value Date/Time   BNP 958.0 (H) 11/30/2019 1110     ---------------------------------------------------------------------------------------------------------------  Urinalysis    Component Value Date/Time   COLORURINE YELLOW 11/30/2019 1147   APPEARANCEUR HAZY (A) 11/30/2019 1147   LABSPEC 1.010 11/30/2019 1147   PHURINE 5.0 11/30/2019 1147   GLUCOSEU >=500 (A) 11/30/2019 1147   HGBUR SMALL (A) 11/30/2019 1147   BILIRUBINUR NEGATIVE 11/30/2019 1147   KETONESUR NEGATIVE 11/30/2019 1147   PROTEINUR >=300 (A) 11/30/2019 1147   UROBILINOGEN 1.0 02/23/2013 1136   NITRITE NEGATIVE 11/30/2019 1147    LEUKOCYTESUR NEGATIVE 11/30/2019 1147    ----------------------------------------------------------------------------------------------------------------   Imaging Results:    US Venous Img Lower Unilateral Left  Result Date: 11/30/2019 CLINICAL DATA:  Left lower extremity pain and swelling for 2 days. EXAM: Left LOWER EXTREMITY VENOUS DOPPLER ULTRASOUND TECHNIQUE: Gray-scale sonography with compression, as well as color and duplex ultrasound, were performed to evaluate the deep venous system(s) from the level of the common femoral vein through the popliteal and proximal calf veins. COMPARISON:  None FINDINGS:  VENOUS Normal compressibility of the common femoral, superficial femoral, and popliteal veins, as well as the visualized calf veins. Visualized portions of profunda femoral vein and great saphenous vein unremarkable. No filling defects to suggest DVT on grayscale or color Doppler imaging. Doppler waveforms show normal direction of venous flow, normal respiratory phasicity and response to augmentation. Limited views of the contralateral common femoral vein are unremarkable. OTHER None Limitations: none IMPRESSION: No femoropopliteal DVT nor evidence of DVT within the visualized calf veins. If clinical symptoms are inconsistent or if there are persistent or worsening symptoms, further imaging (possibly involving the iliac veins) may be warranted. Electronically Signed   By: Marijo Sanes M.D.   On: 11/30/2019 13:18   DG Chest Portable 1 View  Result Date: 11/30/2019 CLINICAL DATA:  Chest pain and shortness of breath. EXAM: PORTABLE CHEST 1 VIEW COMPARISON:  09/25/2019 FINDINGS: The heart is mildly enlarged but stable. No pulmonary edema. There is a small left effusion and overlying infiltrate or atelectasis. The right lung is relatively clear. IMPRESSION: Small left effusion with overlying atelectasis or infiltrate. Electronically Signed   By: Marijo Sanes M.D.   On: 11/30/2019 14:17     Radiological Exams on Admission: US Venous Img Lower Unilateral Left  Result Date: 11/30/2019 CLINICAL DATA:  Left lower extremity pain and swelling for 2 days. EXAM: Left LOWER EXTREMITY VENOUS DOPPLER ULTRASOUND TECHNIQUE: Gray-scale sonography with compression, as well as color and duplex ultrasound, were performed to evaluate the deep venous system(s) from the level of the common femoral vein through the popliteal and proximal calf veins. COMPARISON:  None FINDINGS: VENOUS Normal compressibility of the common femoral, superficial femoral, and popliteal veins, as well as the visualized calf veins. Visualized portions of profunda femoral vein and great saphenous vein unremarkable. No filling defects to suggest DVT on grayscale or color Doppler imaging. Doppler waveforms show normal direction of venous flow, normal respiratory phasicity and response to augmentation. Limited views of the contralateral common femoral vein are unremarkable. OTHER None Limitations: none IMPRESSION: No femoropopliteal DVT nor evidence of DVT within the visualized calf veins. If clinical symptoms are inconsistent or if there are persistent or worsening symptoms, further imaging (possibly involving the iliac veins) may be warranted. Electronically Signed   By: Marijo Sanes M.D.   On: 11/30/2019 13:18   DG Chest Portable 1 View  Result Date: 11/30/2019 CLINICAL DATA:  Chest pain and shortness of breath. EXAM: PORTABLE CHEST 1 VIEW COMPARISON:  09/25/2019 FINDINGS: The heart is mildly enlarged but stable. No pulmonary edema. There is a small left effusion and overlying infiltrate or atelectasis. The right lung is relatively clear. IMPRESSION: Small left effusion with overlying atelectasis or infiltrate. Electronically Signed   By: Marijo Sanes M.D.   On: 11/30/2019 14:17    DVT Prophylaxis -SCD   AM Labs Ordered, also please review Full Orders  Family Communication: Admission, patients condition and plan of care  including tests being ordered have been discussed with the patient  who indicate understanding and agree with the plan   Code Status - Full Code  Likely DC to  TBD  Condition   stable  Roxan Hockey M.D on 11/30/2019 at 5:05 PM Go to [email or web address] -  for contact info  [facility] - Office  [phone]

## 2019-12-01 LAB — SARS CORONAVIRUS 2 (TAT 6-24 HRS): SARS Coronavirus 2: NEGATIVE

## 2019-12-01 LAB — CBC
HCT: 29.9 % — ABNORMAL LOW (ref 39.0–52.0)
Hemoglobin: 9.6 g/dL — ABNORMAL LOW (ref 13.0–17.0)
MCH: 28.6 pg (ref 26.0–34.0)
MCHC: 32.1 g/dL (ref 30.0–36.0)
MCV: 89 fL (ref 80.0–100.0)
Platelets: 182 10*3/uL (ref 150–400)
RBC: 3.36 MIL/uL — ABNORMAL LOW (ref 4.22–5.81)
RDW: 14.6 % (ref 11.5–15.5)
WBC: 12.2 10*3/uL — ABNORMAL HIGH (ref 4.0–10.5)
nRBC: 0 % (ref 0.0–0.2)

## 2019-12-01 LAB — GLUCOSE, CAPILLARY
Glucose-Capillary: 148 mg/dL — ABNORMAL HIGH (ref 70–99)
Glucose-Capillary: 162 mg/dL — ABNORMAL HIGH (ref 70–99)
Glucose-Capillary: 155 mg/dL — ABNORMAL HIGH (ref 70–99)
Glucose-Capillary: 188 mg/dL — ABNORMAL HIGH (ref 70–99)

## 2019-12-01 LAB — OCCULT BLOOD X 1 CARD TO LAB, STOOL: Fecal Occult Bld: NEGATIVE

## 2019-12-01 MED ORDER — FUROSEMIDE 10 MG/ML IJ SOLN
80.0000 mg | Freq: Every day | INTRAMUSCULAR | Status: DC
  Filled 2019-12-01: qty 8

## 2019-12-01 MED ORDER — AZITHROMYCIN 250 MG PO TABS
500.0000 mg | ORAL_TABLET | Freq: Every day | ORAL | Status: DC
  Administered 2019-12-01 – 2019-12-02 (×2): 500 mg via ORAL
  Filled 2019-12-01 (×2): qty 2

## 2019-12-01 MED ORDER — TRAZODONE HCL 50 MG PO TABS
50.0000 mg | ORAL_TABLET | Freq: Every day | ORAL | Status: DC
  Administered 2019-12-01: 50 mg via ORAL
  Filled 2019-12-01: qty 1

## 2019-12-01 NOTE — Progress Notes (Signed)
Patient Demographics:    Michael Trevino, is a 79 y.o. male, DOB - 09-22-1941, [id number]  Admit date - 11/30/2019   Admitting Physician Michael Langhans Denton Brick, MD  Outpatient Primary MD for the patient is Dettinger, Fransisca Kaufmann, MD  LOS - 1   Chief Complaint  Patient presents with  . end stage renal        Subjective:    Michael Trevino today has no fevers, no emesis,  No chest pain, left leg discomfort improving  Assessment  & Plan :    Principal Problem:   Cellulitis and abscess of lower extremity Active Problems:   Lt Sided Pneumonia   Hypothyroidism   Illiterate   CAD (coronary artery disease)   Essential hypertension, benign   CKD stage 4 due to type 2 diabetes mellitus ([place])   A/p 1) left lower extremity cellulitis--- WBC down to 12 from 15.5  venous Dopplers negative for acute DVT, -Left lower extremity discomfort redness warmth and swelling appears to be improving -Continue IV Rocephin started on 11/30/2019 , elevate lower extremity  2)Left-sided community-acquired pneumonia--- no productive cough and no fevers, continue  IV Rocephin , c/n Azithromycin, -leukocytosis and dyspnea improving -Tachypnea resolving -COVID-19 testing negative  3)HFpEF-----Fluid overload possibly secondary toacute on 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 Denies any stent placement. Continue aspirin and Lipitor as well as metoprolol  7)Hypertension: BP not at goal, continue amlodipine 10 mg daily,, metoprolol 100 mg twice daily, -- may use IV labetalol when necessary  Every 4 hours for systolic blood pressure over 160 mmhg  8)Diabetes mellitus type 2: Last A1c 7.4 on 07/03/2019 Continue Levemir insulin 30 units nightly along with sliding scale coverage  9)Hypothyroidism: Continue levothyroxine 150 mcg daily  10) chronic anemia--baseline hemoglobin usually around 10, hemoglobin on admission was 11.3, back to 9.6 today which is closer to his baseline, stool occult blood negative  Disposition/Need for in-Hospital Stay- patient unable to be discharged at this time due to --- cellulitis and left-sided pneumonia requiring IV antibiotics  Code Status : Full  Family Communication:   NA (patient is alert, awake and coherent)   Disposition Plan  : To be determined  Consults  :  na  DVT Prophylaxis  :  - Heparin - SCDs   Lab Results  Component Value Date   PLT 182 12/01/2019    Inpatient Medications  Scheduled Meds: . amLODipine  10 mg  Oral Daily  . ascorbic acid  500 mg Oral Daily  . aspirin EC  81 mg Oral Daily  . atorvastatin  40 mg Oral q1800  . azithromycin  500 mg Oral Daily  . furosemide  80 mg Intravenous BID  . guaiFENesin  600 mg Oral BID  . heparin  5,000 Units Subcutaneous Q8H  . insulin aspart  0-15 Units Subcutaneous TID WC  . insulin aspart  0-5 Units Subcutaneous QHS  . insulin detemir  30 Units Subcutaneous QHS  . levothyroxine  150 mcg Oral QAC breakfast  . metoprolol tartrate  100 mg Oral BID  . sodium chloride flush  3 mL Intravenous Q12H  .  traZODone  50 mg Oral QHS   Continuous Infusions: . sodium chloride    . cefTRIAXone (ROCEPHIN)  IV 2 g (12/01/19 1303)   PRN Meds:.sodium chloride, acetaminophen **OR** acetaminophen, albuterol, labetalol, ondansetron **OR** ondansetron (ZOFRAN) IV, polyethylene glycol, sodium chloride flush    Anti-infectives (From admission, onward)   Start     Dose/Rate Route Frequency Ordered Stop   12/01/19 1600  azithromycin (ZITHROMAX) tablet 500 mg     500 mg Oral Daily 12/01/19 1248     11/30/19 1630  azithromycin (ZITHROMAX) 500 mg in sodium chloride 0.9 % 250 mL IVPB  Status:  Discontinued     500 mg 250 mL/hr over 60 Minutes Intravenous Every 24 hours 11/30/19 1626 12/01/19 1248   11/30/19 1400  cefTRIAXone (ROCEPHIN) 2 g in sodium chloride 0.9 % 100 mL IVPB     2 g 200 mL/hr over 30 Minutes Intravenous Every 24 hours 11/30/19 1349     11/30/19 1130  vancomycin (VANCOCIN) IVPB 1000 mg/200 mL premix     1,000 mg 200 mL/hr over 60 Minutes Intravenous  Once 11/30/19 1121 11/30/19 1258        Objective:   Vitals:   11/30/19 2328 12/01/19 0625 12/01/19 0902 12/01/19 1436  BP:  (!) 150/56  (!) 149/55  Pulse:  87  87  Resp:  20  20  Temp: 100.2 F (37.9 C) 98.6 F (37 C)  98.3 F (36.8 C)  TempSrc: Oral Oral  Oral  SpO2:  97% 93% 95%  Weight:  111 kg    Height:        Wt Readings from Last 3 Encounters:  12/01/19 111 kg  09/30/19 113 kg  06/24/19 115.9 kg     Intake/Output Summary (Last 24 hours) at 12/01/2019 1654 Last data filed at 12/01/2019 1548 Gross per 24 hour  Intake 590 ml  Output 350 ml  Net 240 ml     Physical Exam  Gen:- Awake Alert, speaking in sentences HEENT:- [place].AT, No sclera icterus Neck-Supple Neck,No JVD,.  Lungs-improving air movement, no wheezing CV- S1, S2 normal, regular  Abd-  +ve B.Sounds, Abd Soft, No tenderness,    Extremity/Skin:-pedal pulses present, left lower extremity warmth, swelling redness and tenderness improving, Psych-affect  is appropriate, oriented x3 Neuro-generalized weakness, no new focal deficits, no tremors   Data Review:   Micro Results Recent Results (from the past 240 hour(s))  SARS CORONAVIRUS 2 (TAT 6-24 HRS) Nasopharyngeal Nasopharyngeal Swab     Status: None   Collection Time: 11/30/19 11:23 AM   Specimen: Nasopharyngeal Swab  Result Value Ref Range Status   SARS Coronavirus 2 NEGATIVE NEGATIVE Final    Comment: (NOTE) SARS-CoV-2 target nucleic acids are NOT DETECTED. The SARS-CoV-2 RNA is generally detectable in upper and lower respiratory specimens during the  acute phase of infection. Negative results do not preclude SARS-CoV-2 infection, do not rule out co-infections with other pathogens, and should not be used as the sole basis for treatment or other patient management decisions. Negative results must be combined with clinical observations, patient history, and epidemiological information. The expected result is Negative. Fact Sheet for Patients: SugarRoll.be Fact Sheet for Healthcare Providers: https://www.woods-mathews.com/ This test is not yet approved or cleared by the [place] FDA and  has been authorized for detection and/or diagnosis of SARS-CoV-2 by FDA under an Emergency Use Authorization (EUA). This EUA will remain  in effect (meaning this test can be used) for the duration of the COVID-19 declaration under Section 56 4(b)(1) of the Act, 21 U.S.C. section 360bbb-3(b)(1), unless the authorization is terminated or revoked sooner. Performed at [facility], [place] [address], [place], [place] [address]     Radiology Reports US Venous Img Lower Unilateral Left  Result Date: 11/30/2019 CLINICAL DATA:  Left lower extremity pain and swelling for 2 days. EXAM: Left LOWER EXTREMITY VENOUS DOPPLER ULTRASOUND TECHNIQUE: Gray-scale sonography with compression, as well as color and duplex ultrasound, were performed to evaluate the deep  venous system(s) from the level of the common femoral vein through the popliteal and proximal calf veins. COMPARISON:  None FINDINGS: VENOUS Normal compressibility of the common femoral, superficial femoral, and popliteal veins, as well as the visualized calf veins. Visualized portions of profunda femoral vein and great saphenous vein unremarkable. No filling defects to suggest DVT on grayscale or color Doppler imaging. Doppler waveforms show normal direction of venous flow, normal respiratory phasicity and response to augmentation. Limited views of the contralateral common femoral vein are unremarkable. OTHER None Limitations: none IMPRESSION: No femoropopliteal DVT nor evidence of DVT within the visualized calf veins. If clinical symptoms are inconsistent or if there are persistent or worsening symptoms, further imaging (possibly involving the iliac veins) may be warranted. Electronically Signed   By: Marijo Sanes M.D.   On: 11/30/2019 13:18   DG Chest Portable 1 View  Result Date: 11/30/2019 CLINICAL DATA:  Chest pain and shortness of breath. EXAM: PORTABLE CHEST 1 VIEW COMPARISON:  09/25/2019 FINDINGS: The heart is mildly enlarged but stable. No pulmonary edema. There is a small left effusion and overlying infiltrate or atelectasis. The right lung is relatively clear. IMPRESSION: Small left effusion with overlying atelectasis or infiltrate. Electronically Signed   By: Marijo Sanes M.D.   On: 11/30/2019 14:17     CBC Recent Labs  Lab 11/30/19 1106 12/01/19 0559  WBC 15.5* 12.2*  HGB 11.3* 9.6*  HCT 35.0* 29.9*  PLT 215 182  MCV 88.6 89.0  MCH 28.6 28.6  MCHC 32.3 32.1  RDW 14.4 14.6    Chemistries  Recent Labs  Lab 11/30/19 1106  NA 140  K 4.0  CL 109  CO2 22  GLUCOSE 168*  BUN 40*  CREATININE 3.51*  CALCIUM 7.2*   ------------------------------------------------------------------------------------------------------------------ No results for input(s): CHOL, HDL, LDLCALC,  TRIG, CHOLHDL, LDLDIRECT in the last 72 hours.  Lab Results  Component Value Date   HGBA1C 7.4 07/11/2019   ------------------------------------------------------------------------------------------------------------------ No results for input(s): TSH, T4TOTAL, T3FREE, THYROIDAB in the last 72 hours.  Invalid input(s): FREET3 ------------------------------------------------------------------------------------------------------------------ No results for input(s): VITAMINB12, FOLATE, FERRITIN, TIBC, IRON, RETICCTPCT in the last 72 hours.  Coagulation profile No results for input(s): INR, PROTIME in the last 168 hours.  No results for input(s): DDIMER in the last 72 hours.  Cardiac Enzymes No results  for input(s): CKMB, TROPONINI, MYOGLOBIN in the last 168 hours.  Invalid input(s): CK ------------------------------------------------------------------------------------------------------------------    Component Value Date/Time   BNP 958.0 (H) 11/30/2019 1110     Roxan Hockey M.D on 12/01/2019 at 4:54 PM  Go to [email or web address] - for contact info  [facility] - Office  [phone]

## 2019-12-02 LAB — CBC
HCT: 28.7 % — ABNORMAL LOW (ref 39.0–52.0)
Hemoglobin: 8.9 g/dL — ABNORMAL LOW (ref 13.0–17.0)
MCH: 27.6 pg (ref 26.0–34.0)
MCHC: 31 g/dL (ref 30.0–36.0)
MCV: 89.1 fL (ref 80.0–100.0)
Platelets: 180 10*3/uL (ref 150–400)
RBC: 3.22 MIL/uL — ABNORMAL LOW (ref 4.22–5.81)
RDW: 14.5 % (ref 11.5–15.5)
WBC: 9.2 10*3/uL (ref 4.0–10.5)
nRBC: 0 % (ref 0.0–0.2)

## 2019-12-02 LAB — RENAL FUNCTION PANEL
Albumin: 2.1 g/dL — ABNORMAL LOW (ref 3.5–5.0)
Anion gap: 10 (ref 5–15)
BUN: 52 mg/dL — ABNORMAL HIGH (ref 8–23)
CO2: 20 mmol/L — ABNORMAL LOW (ref 22–32)
Calcium: 6.6 mg/dL — ABNORMAL LOW (ref 8.9–10.3)
Chloride: 107 mmol/L (ref 98–111)
Creatinine, Ser: 3.92 mg/dL — ABNORMAL HIGH (ref 0.61–1.24)
GFR calc Af Amer: 16 mL/min — ABNORMAL LOW (ref >60)
GFR calc non Af Amer: 14 mL/min — ABNORMAL LOW (ref >60)
Glucose, Bld: 163 mg/dL — ABNORMAL HIGH (ref 70–99)
Phosphorus: 4 mg/dL (ref 2.5–4.6)
Potassium: 3.4 mmol/L — ABNORMAL LOW (ref 3.5–5.1)
Sodium: 137 mmol/L (ref 135–145)

## 2019-12-02 LAB — GLUCOSE, CAPILLARY
Glucose-Capillary: 128 mg/dL — ABNORMAL HIGH (ref 70–99)
Glucose-Capillary: 211 mg/dL — ABNORMAL HIGH (ref 70–99)

## 2019-12-02 MED ORDER — POTASSIUM CHLORIDE CRYS ER 20 MEQ PO TBCR
40.0000 meq | EXTENDED_RELEASE_TABLET | Freq: Once | ORAL | Status: AC
  Administered 2019-12-02: 40 meq via ORAL
  Filled 2019-12-02: qty 2

## 2019-12-02 MED ORDER — FUROSEMIDE 80 MG PO TABS: 80.0000 mg | ORAL_TABLET | Freq: Two times a day (BID) | ORAL | Status: DC

## 2019-12-02 MED ORDER — POTASSIUM CHLORIDE ER 10 MEQ PO TBCR: 10.0000 meq | EXTENDED_RELEASE_TABLET | Freq: Two times a day (BID) | ORAL | 0 refills | Status: DC

## 2019-12-02 MED ORDER — CEPHALEXIN 500 MG PO CAPS: 500.0000 mg | ORAL_CAPSULE | Freq: Four times a day (QID) | ORAL | 0 refills | Status: AC

## 2019-12-02 MED ORDER — AZITHROMYCIN 250 MG PO TABS: 250.0000 mg | ORAL_TABLET | Freq: Every day | ORAL | 0 refills | Status: AC

## 2019-12-02 MED ORDER — GUAIFENESIN ER 600 MG PO TB12: 600.0000 mg | ORAL_TABLET | Freq: Two times a day (BID) | ORAL | 0 refills | Status: AC

## 2019-12-02 NOTE — Evaluation (Signed)
Physical Therapy Evaluation Patient Details Name: Dent Kelner MRN: [id number] DOB: October 20, 1941 Today's Date: 12/02/2019   History of Present Illness  Kunga Mesias  is a 79 y.o. male   with medical history significant of DM2, HLD, HTN, CKD IV, CHF,  CAD/Prior MI, hypothyroidism and history of CVA to the ED with increased swelling and discomfort left lower extremity despite diuretic use as well as shortness of breath    Clinical Impression  Patient limited for functional mobility as stated below secondary to BLE weakness, fatigue and poor standing balance. He requires mod assist to transition to seated and min assist to transfer to standing with use of RW. He ambulates with small labored steps but is safe and demonstrates impaired activity tolerance. He requires some verbal cueing for sequencing to transition safely to seated. Patient returned to bed and is left eating breakfast on EOB - RN aware. Patient will benefit from continued physical therapy in hospital and recommended venue below to increase strength, balance, endurance for safe ADLs and gait.     Follow Up Recommendations Home health PT;Supervision - Intermittent;Supervision for mobility/OOB    Equipment Recommendations  Rolling walker with 5" wheels    Recommendations for Other Services       Precautions / Restrictions Precautions Precautions: Fall Restrictions Weight Bearing Restrictions: No      Mobility  Bed Mobility Overal bed mobility: Needs Assistance Bed Mobility: Supine to Sit     Supine to sit: Mod assist     General bed mobility comments: to transition to seated EOB  Transfers Overall transfer level: Needs assistance Equipment used: Rolling walker (2 wheeled) Transfers: Sit to/from [facility] Sit to Stand: Min assist Stand pivot transfers: Min assist       General transfer comment: with use of RW; verbal cueing and demonstration for sequencing  safety  Ambulation/Gait Ambulation/Gait assistance: Min assist Gait Distance (Feet): 80 Feet Assistive device: Rolling walker (2 wheeled) Gait Pattern/deviations: Decreased step length - right;Decreased step length - left;Step-through pattern;Decreased stride length Gait velocity: decreased   General Gait Details: Patient ambulates with slow, labored, small steps with use of RW; small shuffled steps for turns, cueing for staying inside [employer]            Wheelchair Mobility    Modified Rankin (Stroke Patients Only)       Balance Overall balance assessment: Needs assistance Sitting-balance support: No upper extremity supported;Feet supported Sitting balance-Leahy Scale: Good Sitting balance - Comments: seated EOB     Standing balance-Leahy Scale: Fair Standing balance comment: standing with RW                             Pertinent Vitals/Pain Pain Assessment: No/denies pain    Home Living Family/patient expects to be discharged to:: Private residence Living Arrangements: Spouse/significant other Available Help at Discharge: Family;Available 24 hours/day Type of Home: Mobile home Home Access: Level entry     Home Layout: One level Home Equipment: Cane - single point      Prior Function Level of Independence: Independent with assistive device(s);Needs assistance   Gait / Transfers Assistance Needed: patient states limited community ambulation with intermittent use of SPC  ADL's / Homemaking Assistance Needed: mostly independent but wife assists when needed        Hand Dominance        Extremity/Trunk Assessment   Upper Extremity Assessment Upper Extremity Assessment: Generalized weakness    Lower  Extremity Assessment Lower Extremity Assessment: Generalized weakness    Cervical / Trunk Assessment Cervical / Trunk Assessment: Normal  Communication      Cognition Arousal/Alertness: Awake/alert Behavior During Therapy: WFL for  tasks assessed/performed Overall Cognitive Status: Within Functional Limits for tasks assessed                                        General Comments      Exercises     Assessment/Plan    PT Assessment Patient needs continued PT services  PT Problem List Decreased strength;Decreased activity tolerance;Decreased balance;Decreased mobility;Decreased knowledge of use of DME;Obesity       PT Treatment Interventions DME instruction;Gait training;Functional mobility training;Therapeutic activities;Therapeutic exercise;Balance training;Neuromuscular re-education;Patient/family education    PT Goals (Current goals can be found in the Care Plan section)  Acute Rehab PT Goals Patient Stated Goal: return home PT Goal Formulation: With patient Time For Goal Achievement: 12/16/19 Potential to Achieve Goals: Good    Frequency Min 3X/week   Barriers to discharge        Co-evaluation               AM-PAC PT "6 Clicks" Mobility  Outcome Measure Help needed turning from your back to your side while in a flat bed without using bedrails?: None Help needed moving from lying on your back to sitting on the side of a flat bed without using bedrails?: A Little Help needed moving to and from a bed to a chair (including a wheelchair)?: A Little Help needed standing up from a chair using your arms (e.g., wheelchair or bedside chair)?: A Little Help needed to walk in hospital room?: A Little Help needed climbing 3-5 steps with a railing? : A Lot 6 Click Score: 18    End of Session Equipment Utilized During Treatment: Gait belt Activity Tolerance: Patient tolerated treatment well;Patient limited by fatigue Patient left: in bed;with call bell/phone within reach Nurse Communication: Mobility status PT Visit Diagnosis: Unsteadiness on feet (R26.81);Other abnormalities of gait and mobility (R26.89);Muscle weakness (generalized) (M62.81)    Time: [id number] PT Time  Calculation (min) (ACUTE ONLY): 25 min   Charges:   PT Evaluation $PT Eval Low Complexity: 1 Low PT Treatments $Therapeutic Activity: 23-37 mins        9:07 AM, 12/02/19 Mearl Latin PT, DPT Physical Therapist at [facility]

## 2019-12-02 NOTE — TOC Transition Note (Signed)
Transition of Care [facility]) - CM/SW Discharge Note   Patient Details  Name: Michael Trevino MRN: [id number] Date of Birth: 02/05/1941  [facility]) CM/SW Contact:  Boneta Lucks, RN Phone Number: 12/02/2019, 12:32 PM   Clinical Narrative:   Patient admitted for cellulitis, Patient discharging today, PT is recommending HHPT.  Patient has used [place] in the past.  Call Romualdo Bolk with [facility] she accepted the referral for HHPT. Add to AVS, RN and MD updated.     Final next level of care: [place] Barriers to Discharge: Barriers Resolved   Patient Goals and CMS Choice Patient states their goals for this hospitalization and ongoing recovery are:: to go home. CMS Medicare.gov Compare Post Acute Care list provided to:: Patient Choice offered to / list presented to : Patient  Discharge Placement                Patient to be transferred to facility by: family   Patient and family notified of of transfer: 12/02/19  Discharge Plan and Services      HH Arranged: PT [facility] Agency: [place] ([place]) Date [place]: 12/02/19 Time [place]: 1232 Representative spoke with at [place]: Romualdo Bolk

## 2019-12-02 NOTE — Progress Notes (Signed)
Discharge instructions reviewed with patient. Given AVS. Prescriptions called in to his pharmacy. Verbalized understanding of instructions, picking up prescriptions, f/u with PCP. IV site removed, site within normal limits. Patient left unit in stable condition via w/c accompanied by nurse tech. Discharged home.

## 2019-12-02 NOTE — Plan of Care (Signed)
  Problem: Acute Rehab PT Goals(only PT should resolve) Goal: Pt Will Go Supine/Side To Sit Outcome: Progressing Flowsheets (Taken 12/02/2019 0908) Pt will go Supine/Side to Sit: with supervision Goal: Patient Will Transfer Sit To/From Stand Outcome: Progressing Flowsheets (Taken 12/02/2019 0908) Patient will transfer sit to/from stand: with supervision Goal: Pt Will Transfer Bed To Chair/Chair To Bed Outcome: Progressing Flowsheets (Taken 12/02/2019 0908) Pt will Transfer Bed to Chair/Chair to Bed: with supervision Goal: Pt Will Ambulate Outcome: Progressing Flowsheets (Taken 12/02/2019 0908) Pt will Ambulate:  > 125 feet  with supervision  with least restrictive assistive device  9:09 AM, 12/02/19 Mearl Latin PT, DPT Physical Therapist at [facility]

## 2019-12-02 NOTE — Discharge Summary (Signed)
Physician Discharge Summary  Michael Trevino [id number] DOB: 01-Aug-1941 DOA: 11/30/2019  PCP: Dettinger, Fransisca Kaufmann, MD  Admit date: 11/30/2019  Discharge date: 12/02/2019  Admitted From:Home  Disposition:  Home  Recommendations for Outpatient Follow-up:  1. Follow up with PCP in 1-2 weeks 2. Please obtain BMP in one week 3. Start on potassium supplementation twice daily, as he is on Lasix, as ordered and follow-up BMP 4. Continue on home Lasix as prior 5. Continue on azithromycin and Keflex as provided to complete course of treatment for pneumonia as well as cellulitis  Home Health: Yes with PT  Equipment/Devices: Rolling walker  Discharge Condition: Stable  CODE STATUS: Full  Diet recommendation: Heart Healthy/carb modified  Brief/Interim Summary: Per HPI: [name]  is a 79 y.o. male  with medical history significant ofDM2, HLD, HTN, CKD IV, CHF,  CAD/Prior MI, hypothyroidism and history of CVA to the ED with increased swelling and discomfort left lower extremity despite diuretic use as well as shortness of breath - No fever  Or chills , no sick contacts at home, no vomiting no diarrhea -No pleuritic symptoms In ED BP is elevated and patient is tachypneic - Dopplers negative for DVT of left lower extremity, chest x-ray with possible left-sided pneumonia -UA with glucosuria and proteinuria as well as bacteria on microscopy -BNP elevated at 958 from 297 a couple months ago -WBC is 15.5, lactic acid is 1.5 -Creatinine is up to 3.5 from a baseline usually around 3, potassium is 4.0 and sodium is 140 with a bicarb of 22 -EDP requested hospitalization for possible left lower extremity cellulitis and possible left-sided pneumonia  1/19: Patient was admitted with left lower extremity cellulitis along with left-sided community-acquired pneumonia and was started on IV Rocephin as well as azithromycin.  He was also noted to have some volume overload with suspected acute on chronic  heart failure with preserved ejection fraction.  He has been adequately diuresed at this point with no significant volume overload noted.  His leukocytosis is down trended and he appears stable for discharge on oral medications to include Keflex and azithromycin as prescribed.  His cellulitis has also cleared considerably.  He has complained of some weakness and has been seen by physical therapy with recommendations for home health physical therapy and rolling walker.  He may resume his usual home medications at this point in time and has had no other acute events noted throughout his short stay.  He will need close follow-up with his PCP to recheck labs as he has been started on some potassium on account of his aggressive diuretic regimen with Lasix at home.  Discharge Diagnoses:  Principal Problem:   Cellulitis and abscess of lower extremity Active Problems:   Hypothyroidism   Illiterate   CAD (coronary artery disease)   Essential hypertension, benign   CKD stage 4 due to type 2 diabetes mellitus ([place])   Lt Sided Pneumonia  Principal discharge diagnosis: Left lower lobe pneumonia and left lower extremity cellulitis along with mild volume overload and acute on chronic heart failure with preserved ejection fraction.  Discharge Instructions  Discharge Instructions    Diet - low sodium heart healthy   Complete by: As directed    Increase activity slowly   Complete by: As directed      Allergies as of 12/02/2019      Reactions   Zocor [simvastatin - High Dose] Nausea Only   Unknown      Medication List    TAKE these  medications   amLODipine 10 MG tablet Commonly known as: NORVASC Take 1 tablet (10 mg total) by mouth daily.   ascorbic acid 500 MG tablet Commonly known as: VITAMIN C Take 500 mg by mouth daily.   aspirin EC 81 MG tablet Take 81 mg by mouth daily.   atorvastatin 40 MG tablet Commonly known as: LIPITOR TAKE 1 TABLET BY MOUTH EVERY DAY   azithromycin 250 MG  tablet Commonly known as: ZITHROMAX Take 1 tablet (250 mg total) by mouth daily for 3 days.   Calcium Carbonate 500 MG Chew Chew 1 tablet (500 mg total) by mouth 3 (three) times daily as needed. What changed: when to take this   cephALEXin 500 MG capsule Commonly known as: KEFLEX Take 1 capsule (500 mg total) by mouth 4 (four) times daily for 8 days.   furosemide 80 MG tablet Commonly known as: LASIX Take 1 tablet (80 mg total) by mouth 2 (two) times daily.   glucose blood test strip Use to check blood glucose once daily.  Dx:  E11.9   guaiFENesin 600 MG 12 hr tablet Commonly known as: MUCINEX Take 1 tablet (600 mg total) by mouth 2 (two) times daily for 10 days.   Insulin Detemir 100 UNIT/ML Pen Commonly known as: Levemir FlexPen Inject 30 Units into the skin at bedtime.   levothyroxine 150 MCG tablet Commonly known as: SYNTHROID Take 1 tablet (150 mcg total) by mouth daily before breakfast.   metoprolol tartrate 100 MG tablet Commonly known as: LOPRESSOR Take 1 tablet (100 mg total) by mouth 2 (two) times daily.   potassium chloride 10 MEQ tablet Commonly known as: KLOR-CON Take 1 tablet (10 mEq total) by mouth 2 (two) times daily.            Durable Medical Equipment  (From admission, onward)         Start     Ordered   12/02/19 1203  DME Walker  Once    Question Answer Comment  Walker: With 5 Inch Wheels   Patient needs a walker to treat with the following condition Weakness      12/02/19 1203         Follow-up [facility].   Specialty: [place] Why:  PT       Dettinger, Fransisca Kaufmann, MD Follow up in 1 week(s).   Specialties: Family Medicine, Cardiology Contact information: [address] [place] [address] [phone]          Allergies  Allergen Reactions  . Zocor [Simvastatin - High Dose] Nausea Only    Unknown    Consultations:  None   Procedures/Studies: US Venous  Img Lower Unilateral Left  Result Date: 11/30/2019 CLINICAL DATA:  Left lower extremity pain and swelling for 2 days. EXAM: Left LOWER EXTREMITY VENOUS DOPPLER ULTRASOUND TECHNIQUE: Gray-scale sonography with compression, as well as color and duplex ultrasound, were performed to evaluate the deep venous system(s) from the level of the common femoral vein through the popliteal and proximal calf veins. COMPARISON:  None FINDINGS: VENOUS Normal compressibility of the common femoral, superficial femoral, and popliteal veins, as well as the visualized calf veins. Visualized portions of profunda femoral vein and great saphenous vein unremarkable. No filling defects to suggest DVT on grayscale or color Doppler imaging. Doppler waveforms show normal direction of venous flow, normal respiratory phasicity and response to augmentation. Limited views of the contralateral common femoral vein are unremarkable. OTHER  None Limitations: none IMPRESSION: No femoropopliteal DVT nor evidence of DVT within the visualized calf veins. If clinical symptoms are inconsistent or if there are persistent or worsening symptoms, further imaging (possibly involving the iliac veins) may be warranted. Electronically Signed   By: Marijo Sanes M.D.   On: 11/30/2019 13:18   DG Chest Portable 1 View  Result Date: 11/30/2019 CLINICAL DATA:  Chest pain and shortness of breath. EXAM: PORTABLE CHEST 1 VIEW COMPARISON:  09/25/2019 FINDINGS: The heart is mildly enlarged but stable. No pulmonary edema. There is a small left effusion and overlying infiltrate or atelectasis. The right lung is relatively clear. IMPRESSION: Small left effusion with overlying atelectasis or infiltrate. Electronically Signed   By: Marijo Sanes M.D.   On: 11/30/2019 14:17     Discharge Exam: Vitals:   12/02/19 0528 12/02/19 0800  BP: (!) 148/56   Pulse: 92   Resp: 17   Temp: 98.2 F (36.8 C)   SpO2: 95% 96%   Vitals:   12/01/19 2122 12/02/19 0500 12/02/19 0528  12/02/19 0800  BP: (!) 156/64  (!) 148/56   Pulse: (!) 107  92   Resp: 16  17   Temp: 98.2 F (36.8 C)  98.2 F (36.8 C)   TempSrc: Oral  Oral   SpO2: 100%  95% 96%  Weight:  111.8 kg    Height:        General: Pt is alert, awake, not in acute distress Cardiovascular: RRR, S1/S2 +, no rubs, no gallops Respiratory: CTA bilaterally, no wheezing, no rhonchi Abdominal: Soft, NT, ND, bowel sounds + Extremities: no edema, no cyanosis, left lower extremity with significant improvement in erythema and warmth.    The results of significant diagnostics from this hospitalization (including imaging, microbiology, ancillary and laboratory) are listed below for reference.     Microbiology: Recent Results (from the past 240 hour(s))  SARS CORONAVIRUS 2 (TAT 6-24 HRS) Nasopharyngeal Nasopharyngeal Swab     Status: None   Collection Time: 11/30/19 11:23 AM   Specimen: Nasopharyngeal Swab  Result Value Ref Range Status   SARS Coronavirus 2 NEGATIVE NEGATIVE Final    Comment: (NOTE) SARS-CoV-2 target nucleic acids are NOT DETECTED. The SARS-CoV-2 RNA is generally detectable in upper and lower respiratory specimens during the acute phase of infection. Negative results do not preclude SARS-CoV-2 infection, do not rule out co-infections with other pathogens, and should not be used as the sole basis for treatment or other patient management decisions. Negative results must be combined with clinical observations, patient history, and epidemiological information. The expected result is Negative. Fact Sheet for Patients: SugarRoll.be Fact Sheet for Healthcare Providers: https://www.woods-mathews.com/ This test is not yet approved or cleared by the [place] FDA and  has been authorized for detection and/or diagnosis of SARS-CoV-2 by FDA under an Emergency Use Authorization (EUA). This EUA will remain  in effect (meaning this test can be used) for the  duration of the COVID-19 declaration under Section 56 4(b)(1) of the Act, 21 U.S.C. section 360bbb-3(b)(1), unless the authorization is terminated or revoked sooner. Performed at [facility], [place] [address], [place], [place] [address]      Labs: BNP (last 3 results) Recent Labs    09/23/19 2011 11/30/19 1110  BNP 297.0* 123XX123*   Basic Metabolic Panel: Recent Labs  Lab 11/30/19 1106 12/02/19 0553  NA 140 137  K 4.0 3.4*  CL 109 107  CO2 22 20*  GLUCOSE 168* 163*  BUN 40*  52*  CREATININE 3.51* 3.92*  CALCIUM 7.2* 6.6*  PHOS  --  4.0   Liver Function Tests: Recent Labs  Lab 12/02/19 0553  ALBUMIN 2.1*   No results for input(s): LIPASE, AMYLASE in the last 168 hours. No results for input(s): AMMONIA in the last 168 hours. CBC: Recent Labs  Lab 11/30/19 1106 12/01/19 0559 12/02/19 0553  WBC 15.5* 12.2* 9.2  HGB 11.3* 9.6* 8.9*  HCT 35.0* 29.9* 28.7*  MCV 88.6 89.0 89.1  PLT 215 182 180   Cardiac Enzymes: No results for input(s): CKTOTAL, CKMB, CKMBINDEX, TROPONINI in the last 168 hours. BNP: Invalid input(s): POCBNP CBG: Recent Labs  Lab 12/01/19 1121 12/01/19 1654 12/01/19 2119 12/02/19 0717 12/02/19 1127  GLUCAP 162* 155* 188* 128* 211*   D-Dimer No results for input(s): DDIMER in the last 72 hours. Hgb A1c No results for input(s): HGBA1C in the last 72 hours. Lipid Profile No results for input(s): CHOL, HDL, LDLCALC, TRIG, CHOLHDL, LDLDIRECT in the last 72 hours. Thyroid function studies No results for input(s): TSH, T4TOTAL, T3FREE, THYROIDAB in the last 72 hours.  Invalid input(s): FREET3 Anemia work up No results for input(s): VITAMINB12, FOLATE, FERRITIN, TIBC, IRON, RETICCTPCT in the last 72 hours. Urinalysis    Component Value Date/Time   COLORURINE YELLOW 11/30/2019 1147   APPEARANCEUR HAZY (A) 11/30/2019 1147   LABSPEC 1.010 11/30/2019 1147   PHURINE 5.0 11/30/2019 1147   GLUCOSEU >=500 (A) 11/30/2019 1147   HGBUR  SMALL (A) 11/30/2019 1147   BILIRUBINUR NEGATIVE 11/30/2019 1147   KETONESUR NEGATIVE 11/30/2019 1147   PROTEINUR >=300 (A) 11/30/2019 1147   UROBILINOGEN 1.0 02/23/2013 1136   NITRITE NEGATIVE 11/30/2019 1147   LEUKOCYTESUR NEGATIVE 11/30/2019 1147   Sepsis Labs Invalid input(s): PROCALCITONIN,  WBC,  LACTICIDVEN Microbiology Recent Results (from the past 240 hour(s))  SARS CORONAVIRUS 2 (TAT 6-24 HRS) Nasopharyngeal Nasopharyngeal Swab     Status: None   Collection Time: 11/30/19 11:23 AM   Specimen: Nasopharyngeal Swab  Result Value Ref Range Status   SARS Coronavirus 2 NEGATIVE NEGATIVE Final    Comment: (NOTE) SARS-CoV-2 target nucleic acids are NOT DETECTED. The SARS-CoV-2 RNA is generally detectable in upper and lower respiratory specimens during the acute phase of infection. Negative results do not preclude SARS-CoV-2 infection, do not rule out co-infections with other pathogens, and should not be used as the sole basis for treatment or other patient management decisions. Negative results must be combined with clinical observations, patient history, and epidemiological information. The expected result is Negative. Fact Sheet for Patients: SugarRoll.be Fact Sheet for Healthcare Providers: https://www.woods-mathews.com/ This test is not yet approved or cleared by the [place] FDA and  has been authorized for detection and/or diagnosis of SARS-CoV-2 by FDA under an Emergency Use Authorization (EUA). This EUA will remain  in effect (meaning this test can be used) for the duration of the COVID-19 declaration under Section 56 4(b)(1) of the Act, 21 U.S.C. section 360bbb-3(b)(1), unless the authorization is terminated or revoked sooner. Performed at [facility], [place] [address] [place], [place] [address]      Time coordinating discharge: 35 minutes  SIGNED:   Rodena Goldmann, DO [facility] 12/02/2019, 12:03  PM  If 7PM-7AM, please contact night-coverage [email or web address]

## 2019-12-03 DIAGNOSIS — E039 Hypothyroidism, unspecified: Secondary | ICD-10-CM | POA: Diagnosis not present

## 2019-12-03 DIAGNOSIS — I5033 Acute on chronic diastolic (congestive) heart failure: Secondary | ICD-10-CM | POA: Diagnosis not present

## 2019-12-03 DIAGNOSIS — I110 Hypertensive heart disease with heart failure: Secondary | ICD-10-CM | POA: Diagnosis not present

## 2019-12-03 DIAGNOSIS — L03116 Cellulitis of left lower limb: Secondary | ICD-10-CM | POA: Diagnosis not present

## 2019-12-03 DIAGNOSIS — I2510 Atherosclerotic heart disease of native coronary artery without angina pectoris: Secondary | ICD-10-CM | POA: Diagnosis not present

## 2019-12-03 DIAGNOSIS — E1122 Type 2 diabetes mellitus with diabetic chronic kidney disease: Secondary | ICD-10-CM | POA: Diagnosis not present

## 2019-12-03 DIAGNOSIS — I252 Old myocardial infarction: Secondary | ICD-10-CM | POA: Diagnosis not present

## 2019-12-03 DIAGNOSIS — M1990 Unspecified osteoarthritis, unspecified site: Secondary | ICD-10-CM | POA: Diagnosis not present

## 2019-12-03 DIAGNOSIS — I080 Rheumatic disorders of both mitral and aortic valves: Secondary | ICD-10-CM | POA: Diagnosis not present

## 2019-12-03 DIAGNOSIS — I0981 Rheumatic heart failure: Secondary | ICD-10-CM | POA: Diagnosis not present

## 2019-12-03 DIAGNOSIS — J188 Other pneumonia, unspecified organism: Secondary | ICD-10-CM | POA: Diagnosis not present

## 2019-12-03 DIAGNOSIS — D631 Anemia in chronic kidney disease: Secondary | ICD-10-CM | POA: Diagnosis not present

## 2019-12-03 DIAGNOSIS — N184 Chronic kidney disease, stage 4 (severe): Secondary | ICD-10-CM | POA: Diagnosis not present

## 2019-12-04 ENCOUNTER — Telehealth: Payer: Self-pay | Admitting: Family Medicine

## 2019-12-04 NOTE — Telephone Encounter (Signed)
Appointment scheduled for Jan 28th at 11:25am.

## 2019-12-05 DIAGNOSIS — N184 Chronic kidney disease, stage 4 (severe): Secondary | ICD-10-CM | POA: Diagnosis not present

## 2019-12-05 DIAGNOSIS — I080 Rheumatic disorders of both mitral and aortic valves: Secondary | ICD-10-CM | POA: Diagnosis not present

## 2019-12-05 DIAGNOSIS — M1990 Unspecified osteoarthritis, unspecified site: Secondary | ICD-10-CM | POA: Diagnosis not present

## 2019-12-05 DIAGNOSIS — I252 Old myocardial infarction: Secondary | ICD-10-CM | POA: Diagnosis not present

## 2019-12-05 DIAGNOSIS — I5033 Acute on chronic diastolic (congestive) heart failure: Secondary | ICD-10-CM | POA: Diagnosis not present

## 2019-12-05 DIAGNOSIS — I0981 Rheumatic heart failure: Secondary | ICD-10-CM | POA: Diagnosis not present

## 2019-12-05 DIAGNOSIS — I110 Hypertensive heart disease with heart failure: Secondary | ICD-10-CM | POA: Diagnosis not present

## 2019-12-05 DIAGNOSIS — D631 Anemia in chronic kidney disease: Secondary | ICD-10-CM | POA: Diagnosis not present

## 2019-12-05 DIAGNOSIS — I2510 Atherosclerotic heart disease of native coronary artery without angina pectoris: Secondary | ICD-10-CM | POA: Diagnosis not present

## 2019-12-05 DIAGNOSIS — E1122 Type 2 diabetes mellitus with diabetic chronic kidney disease: Secondary | ICD-10-CM | POA: Diagnosis not present

## 2019-12-05 DIAGNOSIS — J188 Other pneumonia, unspecified organism: Secondary | ICD-10-CM | POA: Diagnosis not present

## 2019-12-05 DIAGNOSIS — L03116 Cellulitis of left lower limb: Secondary | ICD-10-CM | POA: Diagnosis not present

## 2019-12-05 DIAGNOSIS — E039 Hypothyroidism, unspecified: Secondary | ICD-10-CM | POA: Diagnosis not present

## 2019-12-08 DIAGNOSIS — D631 Anemia in chronic kidney disease: Secondary | ICD-10-CM | POA: Diagnosis not present

## 2019-12-08 DIAGNOSIS — I252 Old myocardial infarction: Secondary | ICD-10-CM | POA: Diagnosis not present

## 2019-12-08 DIAGNOSIS — N184 Chronic kidney disease, stage 4 (severe): Secondary | ICD-10-CM | POA: Diagnosis not present

## 2019-12-08 DIAGNOSIS — E1122 Type 2 diabetes mellitus with diabetic chronic kidney disease: Secondary | ICD-10-CM | POA: Diagnosis not present

## 2019-12-08 DIAGNOSIS — I5033 Acute on chronic diastolic (congestive) heart failure: Secondary | ICD-10-CM | POA: Diagnosis not present

## 2019-12-08 DIAGNOSIS — I110 Hypertensive heart disease with heart failure: Secondary | ICD-10-CM | POA: Diagnosis not present

## 2019-12-08 DIAGNOSIS — I0981 Rheumatic heart failure: Secondary | ICD-10-CM | POA: Diagnosis not present

## 2019-12-08 DIAGNOSIS — I2510 Atherosclerotic heart disease of native coronary artery without angina pectoris: Secondary | ICD-10-CM | POA: Diagnosis not present

## 2019-12-08 DIAGNOSIS — I080 Rheumatic disorders of both mitral and aortic valves: Secondary | ICD-10-CM | POA: Diagnosis not present

## 2019-12-08 DIAGNOSIS — J188 Other pneumonia, unspecified organism: Secondary | ICD-10-CM | POA: Diagnosis not present

## 2019-12-08 DIAGNOSIS — M1990 Unspecified osteoarthritis, unspecified site: Secondary | ICD-10-CM | POA: Diagnosis not present

## 2019-12-08 DIAGNOSIS — L03116 Cellulitis of left lower limb: Secondary | ICD-10-CM | POA: Diagnosis not present

## 2019-12-08 DIAGNOSIS — E039 Hypothyroidism, unspecified: Secondary | ICD-10-CM | POA: Diagnosis not present

## 2019-12-11 ENCOUNTER — Encounter: Payer: Self-pay | Admitting: Family Medicine

## 2019-12-11 ENCOUNTER — Ambulatory Visit (INDEPENDENT_AMBULATORY_CARE_PROVIDER_SITE_OTHER): Payer: Medicare Other | Admitting: Family Medicine

## 2019-12-11 DIAGNOSIS — J189 Pneumonia, unspecified organism: Secondary | ICD-10-CM | POA: Diagnosis not present

## 2019-12-11 DIAGNOSIS — L03119 Cellulitis of unspecified part of limb: Secondary | ICD-10-CM | POA: Diagnosis not present

## 2019-12-11 DIAGNOSIS — L02419 Cutaneous abscess of limb, unspecified: Secondary | ICD-10-CM

## 2019-12-11 NOTE — Progress Notes (Signed)
Virtual Visit via telephone Note  I connected with Michael Trevino on 12/11/19 at 1153 by telephone and verified that I am speaking with the correct person using two identifiers. Michael Trevino is currently located at home and wife are currently with her during visit. The provider, Fransisca Kaufmann Karman Biswell, MD is located in their office at time of visit.  Call ended at 1205  I discussed the limitations, risks, security and privacy concerns of performing an evaluation and management service by telephone and the availability of in person appointments. I also discussed with the patient that there may be a patient responsible charge related to this service. The patient expressed understanding and agreed to proceed.   History and Present Illness: Hospital f/u for pna and cellulitis.  Patient was in the hospital from 1/17-1/19 for pna and cellulitis.  Since leaving the hospital he is breathing a lot better.  Just feeling weak and not feeling like getting out.  It is improving but just slowly. His leg is healed and no longer swollen or red and he has not had any fevers.  He is having diarrhea from medicine of potassium.  [facility] and is improving.     1. Pneumonia of left lower lobe due to infectious organism   2. Cellulitis and abscess of lower extremity     Outpatient Encounter Medications as of 12/11/2019  Medication Sig  . amLODipine (NORVASC) 10 MG tablet Take 1 tablet (10 mg total) by mouth daily.  Marland Kitchen ascorbic acid (VITAMIN C) 500 MG tablet Take 500 mg by mouth daily.  Marland Kitchen aspirin EC 81 MG tablet Take 81 mg by mouth daily.  Marland Kitchen atorvastatin (LIPITOR) 40 MG tablet TAKE 1 TABLET BY MOUTH EVERY DAY  . Calcium Carbonate 500 MG CHEW Chew 1 tablet (500 mg total) by mouth 3 (three) times daily as needed. (Patient taking differently: Chew 500 mg by mouth daily. )  . furosemide (LASIX) 80 MG tablet Take 1 tablet (80 mg total) by mouth 2 (two) times daily.  Marland Kitchen glucose blood test  strip Use to check blood glucose once daily.  Dx:  E11.9  . guaiFENesin (MUCINEX) 600 MG 12 hr tablet Take 1 tablet (600 mg total) by mouth 2 (two) times daily for 10 days.  . Insulin Detemir (LEVEMIR FLEXPEN) 100 UNIT/ML Pen Inject 30 Units into the skin at bedtime.  Marland Kitchen levothyroxine (SYNTHROID) 150 MCG tablet Take 1 tablet (150 mcg total) by mouth daily before breakfast.  . metoprolol tartrate (LOPRESSOR) 100 MG tablet Take 1 tablet (100 mg total) by mouth 2 (two) times daily.  . potassium chloride (KLOR-CON) 10 MEQ tablet Take 1 tablet (10 mEq total) by mouth 2 (two) times daily.   No facility-administered encounter medications on file as of 12/11/2019.    Review of Systems  Constitutional: Positive for fatigue. Negative for chills and fever.  Respiratory: Negative for shortness of breath and wheezing.   Cardiovascular: Negative for chest pain and leg swelling.  Musculoskeletal: Negative for back pain and gait problem.  Skin: Negative for rash.  Neurological: Positive for weakness. Negative for dizziness and light-headedness.  All other systems reviewed and are negative.   Observations/Objective: Patient is comfortable and in no acute distress  Assessment and Plan: Problem List Items Addressed This Visit      Respiratory   Lt Sided Pneumonia - Primary   Relevant Orders   CBC with Differential/Platelet   BMP8+EGFR     Other  Cellulitis and abscess of lower extremity   Relevant Orders   CBC with Differential/Platelet   BMP8+EGFR      Cbc and bmp through home health  Follow up plan: Return if symptoms worsen or fail to improve.     I discussed the assessment and treatment plan with the patient. The patient was provided an opportunity to ask questions and all were answered. The patient agreed with the plan and demonstrated an understanding of the instructions.   The patient was advised to call back or seek an in-person evaluation if the symptoms worsen or if the  condition fails to improve as anticipated.  The above assessment and management plan was discussed with the patient. The patient verbalized understanding of and has agreed to the management plan. Patient is aware to call the clinic if symptoms persist or worsen. Patient is aware when to return to the clinic for a follow-up visit. Patient educated on when it is appropriate to go to the emergency department.    I provided 12 minutes of non-face-to-face time during this encounter.    Worthy Rancher, MD

## 2019-12-12 DIAGNOSIS — I252 Old myocardial infarction: Secondary | ICD-10-CM | POA: Diagnosis not present

## 2019-12-12 DIAGNOSIS — N184 Chronic kidney disease, stage 4 (severe): Secondary | ICD-10-CM | POA: Diagnosis not present

## 2019-12-12 DIAGNOSIS — D631 Anemia in chronic kidney disease: Secondary | ICD-10-CM | POA: Diagnosis not present

## 2019-12-12 DIAGNOSIS — I080 Rheumatic disorders of both mitral and aortic valves: Secondary | ICD-10-CM | POA: Diagnosis not present

## 2019-12-12 DIAGNOSIS — I5033 Acute on chronic diastolic (congestive) heart failure: Secondary | ICD-10-CM | POA: Diagnosis not present

## 2019-12-12 DIAGNOSIS — M1990 Unspecified osteoarthritis, unspecified site: Secondary | ICD-10-CM | POA: Diagnosis not present

## 2019-12-12 DIAGNOSIS — L03116 Cellulitis of left lower limb: Secondary | ICD-10-CM | POA: Diagnosis not present

## 2019-12-12 DIAGNOSIS — I0981 Rheumatic heart failure: Secondary | ICD-10-CM | POA: Diagnosis not present

## 2019-12-12 DIAGNOSIS — I2510 Atherosclerotic heart disease of native coronary artery without angina pectoris: Secondary | ICD-10-CM | POA: Diagnosis not present

## 2019-12-12 DIAGNOSIS — I110 Hypertensive heart disease with heart failure: Secondary | ICD-10-CM | POA: Diagnosis not present

## 2019-12-12 DIAGNOSIS — E1122 Type 2 diabetes mellitus with diabetic chronic kidney disease: Secondary | ICD-10-CM | POA: Diagnosis not present

## 2019-12-12 DIAGNOSIS — J188 Other pneumonia, unspecified organism: Secondary | ICD-10-CM | POA: Diagnosis not present

## 2019-12-12 DIAGNOSIS — E039 Hypothyroidism, unspecified: Secondary | ICD-10-CM | POA: Diagnosis not present

## 2019-12-14 DIAGNOSIS — D631 Anemia in chronic kidney disease: Secondary | ICD-10-CM | POA: Diagnosis not present

## 2019-12-14 DIAGNOSIS — E039 Hypothyroidism, unspecified: Secondary | ICD-10-CM | POA: Diagnosis not present

## 2019-12-14 DIAGNOSIS — I080 Rheumatic disorders of both mitral and aortic valves: Secondary | ICD-10-CM | POA: Diagnosis not present

## 2019-12-14 DIAGNOSIS — E1122 Type 2 diabetes mellitus with diabetic chronic kidney disease: Secondary | ICD-10-CM | POA: Diagnosis not present

## 2019-12-14 DIAGNOSIS — J188 Other pneumonia, unspecified organism: Secondary | ICD-10-CM | POA: Diagnosis not present

## 2019-12-14 DIAGNOSIS — M1990 Unspecified osteoarthritis, unspecified site: Secondary | ICD-10-CM | POA: Diagnosis not present

## 2019-12-14 DIAGNOSIS — N184 Chronic kidney disease, stage 4 (severe): Secondary | ICD-10-CM | POA: Diagnosis not present

## 2019-12-14 DIAGNOSIS — I2510 Atherosclerotic heart disease of native coronary artery without angina pectoris: Secondary | ICD-10-CM | POA: Diagnosis not present

## 2019-12-14 DIAGNOSIS — I0981 Rheumatic heart failure: Secondary | ICD-10-CM | POA: Diagnosis not present

## 2019-12-14 DIAGNOSIS — I5033 Acute on chronic diastolic (congestive) heart failure: Secondary | ICD-10-CM | POA: Diagnosis not present

## 2019-12-14 DIAGNOSIS — I110 Hypertensive heart disease with heart failure: Secondary | ICD-10-CM | POA: Diagnosis not present

## 2019-12-14 DIAGNOSIS — L03116 Cellulitis of left lower limb: Secondary | ICD-10-CM | POA: Diagnosis not present

## 2019-12-14 DIAGNOSIS — I252 Old myocardial infarction: Secondary | ICD-10-CM | POA: Diagnosis not present

## 2019-12-15 DIAGNOSIS — I252 Old myocardial infarction: Secondary | ICD-10-CM | POA: Diagnosis not present

## 2019-12-15 DIAGNOSIS — Z794 Long term (current) use of insulin: Secondary | ICD-10-CM | POA: Diagnosis not present

## 2019-12-15 DIAGNOSIS — I110 Hypertensive heart disease with heart failure: Secondary | ICD-10-CM | POA: Diagnosis not present

## 2019-12-15 DIAGNOSIS — I0981 Rheumatic heart failure: Secondary | ICD-10-CM | POA: Diagnosis not present

## 2019-12-15 DIAGNOSIS — L03116 Cellulitis of left lower limb: Secondary | ICD-10-CM | POA: Diagnosis not present

## 2019-12-15 DIAGNOSIS — J188 Other pneumonia, unspecified organism: Secondary | ICD-10-CM | POA: Diagnosis not present

## 2019-12-15 DIAGNOSIS — E039 Hypothyroidism, unspecified: Secondary | ICD-10-CM | POA: Diagnosis not present

## 2019-12-15 DIAGNOSIS — I509 Heart failure, unspecified: Secondary | ICD-10-CM | POA: Diagnosis not present

## 2019-12-15 DIAGNOSIS — D631 Anemia in chronic kidney disease: Secondary | ICD-10-CM | POA: Diagnosis not present

## 2019-12-15 DIAGNOSIS — I5033 Acute on chronic diastolic (congestive) heart failure: Secondary | ICD-10-CM | POA: Diagnosis not present

## 2019-12-15 DIAGNOSIS — I2510 Atherosclerotic heart disease of native coronary artery without angina pectoris: Secondary | ICD-10-CM | POA: Diagnosis not present

## 2019-12-15 DIAGNOSIS — M1990 Unspecified osteoarthritis, unspecified site: Secondary | ICD-10-CM | POA: Diagnosis not present

## 2019-12-15 DIAGNOSIS — N184 Chronic kidney disease, stage 4 (severe): Secondary | ICD-10-CM | POA: Diagnosis not present

## 2019-12-15 DIAGNOSIS — I080 Rheumatic disorders of both mitral and aortic valves: Secondary | ICD-10-CM | POA: Diagnosis not present

## 2019-12-15 DIAGNOSIS — E1122 Type 2 diabetes mellitus with diabetic chronic kidney disease: Secondary | ICD-10-CM | POA: Diagnosis not present

## 2019-12-16 DIAGNOSIS — E039 Hypothyroidism, unspecified: Secondary | ICD-10-CM | POA: Diagnosis not present

## 2019-12-16 DIAGNOSIS — I0981 Rheumatic heart failure: Secondary | ICD-10-CM | POA: Diagnosis not present

## 2019-12-16 DIAGNOSIS — I2510 Atherosclerotic heart disease of native coronary artery without angina pectoris: Secondary | ICD-10-CM | POA: Diagnosis not present

## 2019-12-16 DIAGNOSIS — L03116 Cellulitis of left lower limb: Secondary | ICD-10-CM | POA: Diagnosis not present

## 2019-12-16 DIAGNOSIS — I110 Hypertensive heart disease with heart failure: Secondary | ICD-10-CM | POA: Diagnosis not present

## 2019-12-16 DIAGNOSIS — M1990 Unspecified osteoarthritis, unspecified site: Secondary | ICD-10-CM | POA: Diagnosis not present

## 2019-12-16 DIAGNOSIS — E1122 Type 2 diabetes mellitus with diabetic chronic kidney disease: Secondary | ICD-10-CM | POA: Diagnosis not present

## 2019-12-16 DIAGNOSIS — I5033 Acute on chronic diastolic (congestive) heart failure: Secondary | ICD-10-CM | POA: Diagnosis not present

## 2019-12-16 DIAGNOSIS — I080 Rheumatic disorders of both mitral and aortic valves: Secondary | ICD-10-CM | POA: Diagnosis not present

## 2019-12-16 DIAGNOSIS — I252 Old myocardial infarction: Secondary | ICD-10-CM | POA: Diagnosis not present

## 2019-12-16 DIAGNOSIS — D631 Anemia in chronic kidney disease: Secondary | ICD-10-CM | POA: Diagnosis not present

## 2019-12-16 DIAGNOSIS — N184 Chronic kidney disease, stage 4 (severe): Secondary | ICD-10-CM | POA: Diagnosis not present

## 2019-12-16 DIAGNOSIS — J188 Other pneumonia, unspecified organism: Secondary | ICD-10-CM | POA: Diagnosis not present

## 2019-12-18 DIAGNOSIS — I110 Hypertensive heart disease with heart failure: Secondary | ICD-10-CM | POA: Diagnosis not present

## 2019-12-18 DIAGNOSIS — I5033 Acute on chronic diastolic (congestive) heart failure: Secondary | ICD-10-CM | POA: Diagnosis not present

## 2019-12-18 DIAGNOSIS — L03116 Cellulitis of left lower limb: Secondary | ICD-10-CM | POA: Diagnosis not present

## 2019-12-18 DIAGNOSIS — I252 Old myocardial infarction: Secondary | ICD-10-CM | POA: Diagnosis not present

## 2019-12-18 DIAGNOSIS — I2510 Atherosclerotic heart disease of native coronary artery without angina pectoris: Secondary | ICD-10-CM | POA: Diagnosis not present

## 2019-12-18 DIAGNOSIS — E039 Hypothyroidism, unspecified: Secondary | ICD-10-CM | POA: Diagnosis not present

## 2019-12-18 DIAGNOSIS — M1990 Unspecified osteoarthritis, unspecified site: Secondary | ICD-10-CM | POA: Diagnosis not present

## 2019-12-18 DIAGNOSIS — N184 Chronic kidney disease, stage 4 (severe): Secondary | ICD-10-CM | POA: Diagnosis not present

## 2019-12-18 DIAGNOSIS — D631 Anemia in chronic kidney disease: Secondary | ICD-10-CM | POA: Diagnosis not present

## 2019-12-18 DIAGNOSIS — E1122 Type 2 diabetes mellitus with diabetic chronic kidney disease: Secondary | ICD-10-CM | POA: Diagnosis not present

## 2019-12-18 DIAGNOSIS — I0981 Rheumatic heart failure: Secondary | ICD-10-CM | POA: Diagnosis not present

## 2019-12-18 DIAGNOSIS — J188 Other pneumonia, unspecified organism: Secondary | ICD-10-CM | POA: Diagnosis not present

## 2019-12-18 DIAGNOSIS — I080 Rheumatic disorders of both mitral and aortic valves: Secondary | ICD-10-CM | POA: Diagnosis not present

## 2019-12-19 ENCOUNTER — Ambulatory Visit: Payer: Medicare Other | Admitting: "Endocrinology

## 2019-12-19 ENCOUNTER — Other Ambulatory Visit: Payer: Self-pay

## 2019-12-19 ENCOUNTER — Encounter: Payer: Self-pay | Admitting: "Endocrinology

## 2019-12-19 VITALS — BP 166/67 | HR 70 | Ht 68.0 in | Wt 246.4 lb

## 2019-12-19 DIAGNOSIS — E1122 Type 2 diabetes mellitus with diabetic chronic kidney disease: Secondary | ICD-10-CM

## 2019-12-19 DIAGNOSIS — IMO0002 Reserved for concepts with insufficient information to code with codable children: Secondary | ICD-10-CM

## 2019-12-19 DIAGNOSIS — E039 Hypothyroidism, unspecified: Secondary | ICD-10-CM | POA: Diagnosis not present

## 2019-12-19 DIAGNOSIS — E782 Mixed hyperlipidemia: Secondary | ICD-10-CM

## 2019-12-19 DIAGNOSIS — N183 Chronic kidney disease, stage 3 unspecified: Secondary | ICD-10-CM

## 2019-12-19 DIAGNOSIS — E1165 Type 2 diabetes mellitus with hyperglycemia: Secondary | ICD-10-CM | POA: Diagnosis not present

## 2019-12-19 DIAGNOSIS — I10 Essential (primary) hypertension: Secondary | ICD-10-CM | POA: Diagnosis not present

## 2019-12-19 LAB — POCT GLYCOSYLATED HEMOGLOBIN (HGB A1C): Hemoglobin A1C: 8.5 % — AB (ref 4.0–5.6)

## 2019-12-19 MED ORDER — LEVOTHYROXINE SODIUM 175 MCG PO TABS: 175.0000 ug | ORAL_TABLET | Freq: Every day | ORAL | 3 refills | Status: DC

## 2019-12-19 NOTE — Patient Instructions (Signed)
                                     Advice for Weight Management  -For most of us the best way to lose weight is by diet management. Generally speaking, diet management means consuming less calories intentionally which over time brings about progressive weight loss.  This can be achieved more effectively by restricting carbohydrate consumption to the minimum possible.  So, it is critically important to know your numbers: how much calorie you are consuming and how much calorie you need. More importantly, our carbohydrates sources should be unprocessed or minimally processed complex starch food items.   Sometimes, it is important to balance nutrition by increasing protein intake (animal or plant source), fruits, and vegetables.  -Sticking to a routine mealtime to eat 3 meals a day and avoiding unnecessary snacks is shown to have a big role in weight control. Under normal circumstances, the only time we lose real weight is when we are hungry, so allow hunger to take place- hunger means no food between meal times, only water.  It is not advisable to starve.   -It is better to avoid simple carbohydrates including: Cakes, Sweet Desserts, Ice Cream, Soda (diet and regular), Sweet Tea, Candies, Chips, Cookies, Store Bought Juices, Alcohol in Excess of  1-2 drinks a day, Artificial Sweeteners, Doughnuts, Coffee Creamers, "Sugar-free" Products, etc, etc.  This is not a complete list.....    -Consulting with certified diabetes educators is proven to provide you with the most accurate and current information on diet.  Also, you may be  interested in discussing diet options/exchanges , we can schedule a visit with Penny Crumpton, RDN, CDE for individualized nutrition education.  -Exercise: If you are able: 30 -60 minutes a day ,4 days a week, or 150 minutes a week.  The longer the better.  Combine stretch, strength, and aerobic activities.  If you were told in the past that you  have high risk for cardiovascular diseases, you may seek evaluation by your heart doctor prior to initiating moderate to intense exercise programs.                                  Additional Care Considerations for Diabetes   -Diabetes  is a chronic disease.  The most important care consideration is regular follow-up with your diabetes care provider with the goal being avoiding or delaying its complications and to take advantage of advances in medications and technology.    -Type 2 diabetes is known to coexist with other important comorbidities such as high blood pressure and high cholesterol.  It is critical to control not only the diabetes but also the high blood pressure and high cholesterol to minimize and delay the risk of complications including coronary artery disease, stroke, amputations, blindness, etc.    - Studies showed that people with diabetes will benefit from a class of medications known as ACE inhibitors and statins.  Unless there are specific reasons not to be on these medications, the standard of care is to consider getting one from these groups of medications at an optimal doses.  These medications are generally considered safe and proven to help protect the heart and the kidneys.    - People with diabetes are encouraged to initiate and maintain regular follow-up with eye doctors, foot doctors, dentists ,   and if necessary heart and kidney doctors.     - It is highly recommended that people with diabetes quit smoking or stay away from smoking, and get yearly  flu vaccine and pneumonia vaccine at least every 5 years.  One other important lifestyle recommendation is to ensure adequate sleep - at least 6-7 hours of uninterrupted sleep at night.  -Exercise: If you are able: 30 -60 minutes a day, 4 days a week, or 150 minutes a week.  The longer the better.  Combine stretch, strength, and aerobic activities.  If you were told in the past that you have high risk for cardiovascular  diseases, you may seek evaluation by your heart doctor prior to initiating moderate to intense exercise programs.     COVID-19 Vaccine Information can be found at: [email or web address] For questions related to vaccine distribution or appointments, please email [email or web address] or call [phone].        

## 2019-12-19 NOTE — Progress Notes (Signed)
12/19/2019, 12:11 PM   Endocrinology follow-up note   Subjective:    Patient ID: Michael Trevino, male    DOB: [birth year].  he is being seen  in follow-up for management of currently uncontrolled symptomatic type 2 diabetes, hyperlipidemia, hypertension, and hypothyroidism. PMD:   Dettinger, Fransisca Kaufmann, MD.   Past Medical History:  Diagnosis Date  . Arthritis   . Diabetes mellitus without complication ([place])   . Heart attack ([place])   . Hyperlipidemia   . Hypertension   . MI (myocardial infarction) ([place]) 2000  . Renal disorder   . Sepsis ([place])   . Sepsis due to Streptococcus, group B ([place]) 02/24/2013  . Stroke ([place])   . Thyroid disease    hypothyroid   Past Surgical History:  Procedure Laterality Date  . TEE WITHOUT CARDIOVERSION N/A 02/26/2013   Procedure: TRANSESOPHAGEAL ECHOCARDIOGRAM (TEE);  Surgeon: Thayer Headings, MD;  Location: [facility] ENDOSCOPY;  Service: Cardiovascular;  Laterality: N/A;   Social History   Socioeconomic History  . Marital status: Married    Spouse name: Not on file  . Number of children: 6  . Years of education: 62  . Highest education level: 9th grade  Occupational History  . 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 Smoking status: Former Smoker    Packs/day: 0.50    Years: 30.00    Pack years: 15.00    Types: Cigarettes    Quit date: 11/05/1984    Years since quitting: 35.1  . Smokeless tobacco: Never Used  Substance and Sexual Activity  . Alcohol use: No  . Drug use: No  . Sexual activity: Yes  Other Topics Concern  . Not on file  Social History Narrative  . Not on file   Social Determinants of Health   Financial Resource Strain:   . Difficulty of Paying Living Expenses: Not on file  Food Insecurity:   . Worried About Charity fundraiser in the Last Year: Not on  file  . Ran Out of Food in the Last Year: Not on file  Transportation Needs:   . Lack of Transportation (Medical): Not on file  . Lack of Transportation (Non-Medical): Not on file  Physical Activity:   . Days of Exercise per Week: Not on file  . Minutes of Exercise per Session: Not on file  Stress:   . Feeling of Stress : Not on file  Social Connections:   . Frequency of Communication with Friends and Family: Not on file  . Frequency of Social Gatherings with Friends and Family: Not on file  . Attends Religious Services: Not on file  . Active Member of Clubs or Organizations: Not on file  . Attends Archivist Meetings: Not on file  . Marital Status: Not on file   Outpatient Encounter Medications as of 12/19/2019  Medication Sig  . amLODipine (NORVASC) 10 MG tablet Take 1 tablet (10 mg total) by mouth daily.  Marland Kitchen ascorbic acid (VITAMIN C) 500 MG tablet Take 500 mg by mouth daily.  Marland Kitchen  aspirin EC 81 MG tablet Take 81 mg by mouth daily.  Marland Kitchen atorvastatin (LIPITOR) 40 MG tablet TAKE 1 TABLET BY MOUTH EVERY DAY  . Calcium Carbonate 500 MG CHEW Chew 1 tablet (500 mg total) by mouth 3 (three) times daily as needed. (Patient taking differently: Chew 500 mg by mouth daily. )  . furosemide (LASIX) 80 MG tablet Take 1 tablet (80 mg total) by mouth 2 (two) times daily.  Marland Kitchen glucose blood test strip Use to check blood glucose once daily.  Dx:  E11.9  . Insulin Detemir (LEVEMIR FLEXPEN) 100 UNIT/ML Pen Inject 30 Units into the skin at bedtime.  Marland Kitchen levothyroxine (SYNTHROID) 175 MCG tablet Take 1 tablet (175 mcg total) by mouth daily before breakfast.  . metoprolol tartrate (LOPRESSOR) 100 MG tablet Take 1 tablet (100 mg total) by mouth 2 (two) times daily.  . potassium chloride (KLOR-CON) 10 MEQ tablet Take 1 tablet (10 mEq total) by mouth 2 (two) times daily.  . [DISCONTINUED] levothyroxine (SYNTHROID) 150 MCG tablet Take 1 tablet (150 mcg total) by mouth daily before breakfast.   No  facility-administered encounter medications on file as of 12/19/2019.    ALLERGIES: Allergies  Allergen Reactions  . Zocor [Simvastatin - High Dose] Nausea Only    Unknown    VACCINATION STATUS: Immunization History  Administered Date(s) Administered  . Influenza Whole 08/25/2008  . Influenza, High Dose Seasonal PF 08/30/2016, 08/16/2017, 08/19/2018  . Influenza,inj,Quad PF,6+ Mos 08/19/2014, 08/25/2015  . Pneumococcal Conjugate-13 04/06/2015  . Pneumococcal Polysaccharide-23 10/06/2005, 12/12/2007    Diabetes He presents for his follow-up diabetic visit. He has type 2 diabetes mellitus. Onset time: He was diagnosed at approximate age of 79 years. His disease course has been improving. There are no hypoglycemic associated symptoms. Pertinent negatives for hypoglycemia include no confusion, headaches, pallor or seizures. Pertinent negatives for diabetes include no blurred vision, no chest pain, no fatigue, no polydipsia, no polyphagia, no polyuria and no weakness. There are no hypoglycemic complications. Symptoms are improving. Diabetic complications include heart disease and nephropathy. Risk factors for coronary artery disease include diabetes mellitus, dyslipidemia, family history, hypertension, male sex, obesity, sedentary lifestyle and tobacco exposure. Current diabetic treatment includes insulin injections and oral agent (dual therapy). His weight is decreasing steadily. He is following a generally unhealthy diet. When asked about meal planning, he reported none. He has not had a previous visit with a dietitian. He never participates in exercise. His breakfast blood glucose range is generally 130-140 mg/dl. His bedtime blood glucose range is generally 140-180 mg/dl. His overall blood glucose range is 140-180 mg/dl. An ACE inhibitor/angiotensin II receptor blocker is being taken. He does not see a podiatrist.Eye exam is current.  Hyperlipidemia This is a chronic problem. The current episode  started more than 1 year ago. Exacerbating diseases include diabetes, hypothyroidism and obesity. Pertinent negatives include no chest pain, myalgias or shortness of breath. Current antihyperlipidemic treatment includes statins. Risk factors for coronary artery disease include diabetes mellitus, dyslipidemia, hypertension, male sex, family history, obesity and a sedentary lifestyle.  Hypertension This is a chronic problem. The current episode started more than 1 year ago. The problem is uncontrolled. Pertinent negatives include no blurred vision, chest pain, headaches, neck pain, palpitations or shortness of breath. Risk factors for coronary artery disease include dyslipidemia, diabetes mellitus, family history, male gender, obesity, sedentary lifestyle and smoking/tobacco exposure. Past treatments include angiotensin blockers and calcium channel blockers. Hypertensive end-organ damage includes kidney disease and CAD/MI.    Review of  systems  Constitutional: + Minimally fluctuating body weight,  current  Body mass index is 37.46 kg/m. , no fatigue, no subjective hyperthermia, no subjective hypothermia Eyes: no blurry vision, no xerophthalmia ENT: no sore throat, no nodules palpated in throat, no dysphagia/odynophagia, no hoarseness Cardiovascular: no Chest Pain, no Shortness of Breath, no palpitations, no leg swelling Respiratory: no cough, no shortness of breath Gastrointestinal: no Nausea/Vomiting/Diarhhea Musculoskeletal: no muscle/joint aches Skin: no rashes Neurological: no tremors, no numbness, no tingling, no dizziness Psychiatric: no depression, no anxiety   Objective:    BP (!) 166/67   Pulse 70   Ht 5\' 8"  (1.727 m)   Wt 246 lb 6.4 oz (111.8 kg)   BMI 37.46 kg/m   Wt Readings from Last 3 Encounters:  12/19/19 246 lb 6.4 oz (111.8 kg)  12/02/19 246 lb 7.6 oz (111.8 kg)  09/30/19 249 lb 3.2 oz (113 kg)     Physical Exam- Limited  Constitutional:  Body mass index is 37.46  kg/m. , not in acute distress, normal state of mind Eyes:  EOMI, no exophthalmos Neck: Supple Respiratory: Adequate breathing efforts Musculoskeletal: no gross deformities, strength intact in all four extremities, no gross restriction of joint movements Skin:  no rashes, no hyperemia Neurological: no tremor with outstretched hands, + hard of hearing.  CMP     Component Value Date/Time   NA 137 12/02/2019 0553   NA 141 10/21/2019 0759   K 3.4 (L) 12/02/2019 0553   CL 107 12/02/2019 0553   CO2 20 (L) 12/02/2019 0553   GLUCOSE 163 (H) 12/02/2019 0553   BUN 52 (H) 12/02/2019 0553   BUN 36 (H) 10/21/2019 0759   CREATININE 3.92 (H) 12/02/2019 0553   CALCIUM 6.6 (L) 12/02/2019 0553   PROT 6.3 10/21/2019 0759   ALBUMIN 2.1 (L) 12/02/2019 0553   ALBUMIN 3.2 (L) 10/21/2019 0759   AST 18 10/21/2019 0759   ALT 17 10/21/2019 0759   ALKPHOS 109 10/21/2019 0759   BILITOT 0.2 10/21/2019 0759   GFRNONAA 14 (L) 12/02/2019 0553   GFRAA 16 (L) 12/02/2019 0553     Diabetic Labs (most recent): Lab Results  Component Value Date   HGBA1C 8.5 (A) 12/19/2019   HGBA1C 7.4 07/11/2019   HGBA1C 7.7 (H) 04/11/2019     Lipid Panel ( most recent) Lipid Panel     Component Value Date/Time   CHOL 160 07/11/2019 0000   CHOL 163 04/11/2019 0835   CHOL 108 04/11/2013 1153   TRIG 173 (H) 09/23/2019 2011   TRIG 98 07/17/2013 0940   TRIG 131 04/11/2013 1153   HDL 27 (A) 07/11/2019 0000   HDL 28 (L) 04/11/2019 0835   HDL 31 (L) 07/17/2013 0940   HDL 33 (L) 04/11/2013 1153   CHOLHDL 5.8 (H) 04/11/2019 0835   LDLCALC 98 07/11/2019 0000   LDLCALC 104 (H) 04/11/2019 0835   LDLCALC 49 07/17/2013 0940   LDLCALC 49 04/11/2013 1153      Lab Results  Component Value Date   TSH 4.988 (H) 09/24/2019   TSH 0.49 07/11/2019   TSH 0.459 04/11/2019   TSH 0.07 (A) 12/13/2018   TSH 5.540 (H) 08/26/2018   TSH 6.390 (H) 04/10/2018   TSH 8.370 (H) 01/07/2018   TSH 8.040 (H) 08/16/2017   TSH 7.640 (H)  04/16/2017   TSH 9.510 (H) 10/20/2016   FREET4 1.42 04/10/2018   FREET4 1.22 01/07/2018      Assessment & Plan:   1. DM type 2 causing vascular  disease , Stage 3 renal insufficiency.  - Patient has currently uncontrolled symptomatic type 2 DM since  79 years of age. - Patient reports near target glycemic profile both fasting and postprandial, no major hypoglycemia nor hyperglycemia.   He did not bring his logs nor meter to review.  He denies hypoglycemia.  His point-of-care A1c is 8.5% increasing from 7.4%.  For unclear reasons, he lowered his Levemir to 20 units nightly. -Recent labs reviewed.  -his diabetes is complicated by coronary artery disease, stage 3 renal insufficiency, obesity/sedentary life, illiteracy  and Domonique Lasota remains at a high risk for more acute and chronic complications which include CAD, CVA, CKD, retinopathy, and neuropathy. These are all discussed in detail with the patient.  - I have counseled him on diet management and weight loss, by adopting a carbohydrate restricted/protein rich diet. -He still admits to dietary indiscretions including consumption of sweetened beverages.  - he  admits there is a room for improvement in his diet and drink choices. -  Suggestion is made for him to avoid simple carbohydrates  from his diet including Cakes, Sweet Desserts / Pastries, Ice Cream, Soda (diet and regular), Sweet Tea, Candies, Chips, Cookies, Sweet Pastries,  Store Bought Juices, Alcohol in Excess of  1-2 drinks a day, Artificial Sweeteners, Coffee Creamer, and "Sugar-free" Products. This will help patient to have stable blood glucose profile and potentially avoid unintended weight gain.    - I encouraged him to switch to  unprocessed or minimally processed complex starch and increased protein intake (animal or plant source), fruits, and vegetables.  - he is advised to stick to a routine mealtimes to eat 3 meals  a day and avoid unnecessary snacks ( to snack only  to correct hypoglycemia).    - I have approached him with the following individualized plan to manage diabetes and patient agrees:    -Patient is illiterate, being helped by his elderly wife who can read and write.   -The #1 goal in his diabetes management will still be to avoid hypoglycemia.  -Target A1c for him will be between 8% and 8.5%.   -All attempts should be to keep treatment simple for them to follow.  -Given his presentation with higher A1c of 8.5%, he will be resumed on his original dose of Levemir 30 units nightly, advised to continue monitoring blood glucose twice daily- 2 times daily- before breakfast and at bedtime, and as needed anytime.   -Patient is encouraged to call clinic for blood glucose levels less than 70 or above 300 mg /dl. -He is advised to continue Tradjenta 5 mg p.o.  every morning with breakfast.  -He is not a candidate for metformin nor SGLT2 inhibitors due to CKD. -He will continue to benefit from low-dose glipizide.  He is advised to continue glimepiride at lower dose of 2 mg daily with breakfast.   - Patient specific target  A1c;  LDL, HDL, Triglycerides, and  Waist Circumference were discussed in detail.  2) BP/HTN: -His blood pressure is not controlled to target.  He is advised to continue his current medications, including amlodipine 10 mg p.o. daily, Lasix 80 mg p.o. twice daily, irbesartan 300 mg p.o. daily, and metoprolol 100 mg p.o. twice daily.    3) Lipids/HPL : His most recent lipid panel showed controlled LDL at 63.  He is advised to continue atorvastatin 40 mg daily.   4)  Weight/Diet: CDE Consult will be initiated , exercise, and detailed carbohydrates information provided.  5)  hypothyroidism:  -His previsit thyroid function tests are consistent with inadequate replacement.  I discussed and increased his levothyroxine to 175 mcg p.o. daily before breakfast.    - We discussed about the correct intake of his thyroid hormone, on empty  stomach at fasting, with water, separated by at least 30 minutes from breakfast and other medications,  and separated by more than 4 hours from calcium, iron, multivitamins, acid reflux medications (PPIs). -Patient is made aware of the fact that thyroid hormone replacement is needed for life, dose to be adjusted by periodic monitoring of thyroid function tests.   6) Chronic Care/Health Maintenance:  -he  is on ACEI/ARB and Statin medications and  is encouraged to continue to follow up with Ophthalmology, Dentist,  Podiatrist at least yearly or according to recommendations, and advised to  stay away from smoking. I have recommended yearly flu vaccine and pneumonia vaccination at least every 5 years; and  sleep for at least 7 hours a day. - I advised patient to maintain close follow up with Dettinger, Fransisca Kaufmann, MD for primary care needs.  - Time spent on this patient care encounter:  35 min, of which > 50% was spent in  counseling and the rest reviewing his blood glucose logs , discussing his hypoglycemia and hyperglycemia episodes, reviewing his current and  previous labs / studies  ( including abstraction from other facilities) and medications  doses and developing a  long term treatment plan and documenting his care.   Please refer to Patient Instructions for Blood Glucose Monitoring and Insulin/Medications Dosing Guide"  in media tab for additional information. Please  also refer to " Patient Self Inventory" in the Media  tab for reviewed elements of pertinent patient history.  [name] participated in the discussions, expressed understanding, and voiced agreement with the above plans.  All questions were answered to his satisfaction. he is encouraged to contact clinic should he have any questions or concerns prior to his return visit.    Follow up plan: - Return in about 3 months (around 03/17/2020) for Bring Meter and Logs- A1c in Office, Follow up with Pre-visit Labs.   Glade Lloyd,  MD [facility] [address] [place],  [address] Phone: [phone]  Fax: [phone]    12/19/2019, 12:11 PM  This note was partially dictated with voice recognition software. Similar sounding words can be transcribed inadequately or may not  be corrected upon review.

## 2020-01-08 ENCOUNTER — Telehealth: Payer: Self-pay | Admitting: Family Medicine

## 2020-01-08 NOTE — Chronic Care Management (AMB) (Signed)
  Chronic Care Management   Outreach Note  01/08/2020 Name: Michael Trevino MRN: [id number] DOB: [birth year]  Michael Trevino is a 79 y.o. year old male who is a primary care patient of Dettinger, Fransisca Kaufmann, MD. I reached out to Michael Trevino by phone today in response to a referral sent by Mr. Michael Trevino health plan.     An unsuccessful telephone outreach was attempted today, wife Michael Trevino answered and advised to call back next week . The patient was referred to the case management team for assistance with care management and care coordination.   Follow Up Plan: A HIPPA compliant phone message was left with wife for the patient providing contact information and requesting a return call.  The care management team will reach out to the patient again over the next 7 days.  If patient returns call to provider office, please advise to call [facility] at [place], [place], [place], [place] [address] Direct Dial: [phone] [email or web address] Website: [email or web address]

## 2020-01-15 NOTE — Chronic Care Management (AMB) (Signed)
  Chronic Care Management   Note  01/15/2020 Name: Michael Trevino MRN: [id number] DOB: [birth year]  Michael Trevino is a 79 y.o. year old male who is a primary care patient of Dettinger, Fransisca Kaufmann, MD. I reached out to [name] by phone today in response to a referral sent by Mr. Michael Trevino health plan.     Michael Trevino was given information about Chronic Care Management services today including:  1. CCM service includes personalized support from designated clinical staff supervised by his physician, including individualized plan of care and coordination with other care providers 2. 24/7 contact phone numbers for assistance for urgent and routine care needs. 3. Service will only be billed when office clinical staff spend 20 minutes or more in a month to coordinate care. 4. Only one practitioner may furnish and bill the service in a calendar month. 5. The patient may stop CCM services at any time (effective at the end of the month) by phone call to the office staff. 6. The patient will be responsible for cost sharing (co-pay) of up to 20% of the service fee (after annual deductible is met).  Patient agreed to services and verbal consent obtained.   Follow up plan: Telephone appointment with care management team member scheduled for: 03/17/20  Noreene Larsson, [place], [place], [place] [address] Direct Dial: [phone] [email or web address] Website: [email or web address]

## 2020-01-16 ENCOUNTER — Telehealth: Payer: Self-pay | Admitting: Family Medicine

## 2020-01-16 ENCOUNTER — Other Ambulatory Visit: Payer: Self-pay

## 2020-01-19 ENCOUNTER — Other Ambulatory Visit: Payer: Self-pay

## 2020-01-19 ENCOUNTER — Ambulatory Visit (INDEPENDENT_AMBULATORY_CARE_PROVIDER_SITE_OTHER): Payer: Medicare Other | Admitting: Family Medicine

## 2020-01-19 ENCOUNTER — Encounter: Payer: Self-pay | Admitting: Family Medicine

## 2020-01-19 VITALS — BP 151/55 | HR 68 | Temp 98.0°F | Ht 69.0 in | Wt 243.0 lb

## 2020-01-19 DIAGNOSIS — N183 Chronic kidney disease, stage 3 unspecified: Secondary | ICD-10-CM | POA: Diagnosis not present

## 2020-01-19 DIAGNOSIS — E1122 Type 2 diabetes mellitus with diabetic chronic kidney disease: Secondary | ICD-10-CM

## 2020-01-19 DIAGNOSIS — IMO0002 Reserved for concepts with insufficient information to code with codable children: Secondary | ICD-10-CM

## 2020-01-19 DIAGNOSIS — N184 Chronic kidney disease, stage 4 (severe): Secondary | ICD-10-CM

## 2020-01-19 DIAGNOSIS — E1159 Type 2 diabetes mellitus with other circulatory complications: Secondary | ICD-10-CM

## 2020-01-19 DIAGNOSIS — E039 Hypothyroidism, unspecified: Secondary | ICD-10-CM | POA: Diagnosis not present

## 2020-01-19 DIAGNOSIS — E1165 Type 2 diabetes mellitus with hyperglycemia: Secondary | ICD-10-CM | POA: Diagnosis not present

## 2020-01-19 DIAGNOSIS — I10 Essential (primary) hypertension: Secondary | ICD-10-CM | POA: Diagnosis not present

## 2020-01-19 DIAGNOSIS — E782 Mixed hyperlipidemia: Secondary | ICD-10-CM

## 2020-01-19 LAB — BAYER DCA HB A1C WAIVED: HB A1C (BAYER DCA - WAIVED): 8.7 % — ABNORMAL HIGH (ref <7.0)

## 2020-01-19 NOTE — Progress Notes (Signed)
BP (!) 162/64   Pulse 68   Temp 98 F (36.7 C)   Ht '5\' 9"'  (1.753 m)   Wt 243 lb (110.2 kg)   SpO2 98%   BMI 35.88 kg/m    Subjective:   Patient ID: [name], male    DOB: 11-Jun-1941, 79 y.o.   MRN: [id number]  HPI: Masami Plata is a 79 y.o. male presenting on 01/19/2020 for Medical Management of Chronic Issues (15mf/u) and Diabetes   HPI Type 2 diabetes mellitus Patient sees Dr. NDorien [place] endocrinology for this patient comes in today for recheck of his diabetes. Patient has been currently taking Levemir. Patient is not currently on an ACE inhibitor/ARB. Patient has not seen an ophthalmologist this year. Patient denies any issues with their feet.  Patient does have swelling in his legs but it has come down significantly since hospitalization  Hyperlipidemia Patient is coming in for recheck of his hyperlipidemia. The patient is currently taking atorvastatin. They deny any issues with myalgias or history of liver damage from it. They deny any focal numbness or weakness or chest pain.   Hypothyroidism recheck Patient is coming in for thyroid recheck today as well. They deny any issues with hair changes or heat or cold problems or diarrhea or constipation. They deny any chest pain or palpitations. They are currently on levothyroxine 175 micrograms   Hypertension Patient is currently on metoprolol and amlodipine, and their blood pressure today is 162/64. Patient denies any lightheadedness or dizziness. Patient denies headaches, blurred vision, chest pains, shortness of breath, or weakness. Denies any side effects from medication and is content with current medication.   Relevant past medical, surgical, family and social history reviewed and updated as indicated. Interim medical history since our last visit reviewed. Allergies and medications reviewed and updated.  Review of Systems  Constitutional: Negative for chills and fever.  Eyes: Negative for visual disturbance.    Respiratory: Negative for shortness of breath and wheezing.   Cardiovascular: Positive for leg swelling. Negative for chest pain.  Musculoskeletal: Negative for back pain and gait problem.  Skin: Negative for rash.  Neurological: Negative for dizziness, weakness and light-headedness.  All other systems reviewed and are negative.   Per HPI unless specifically indicated above   Allergies as of 01/19/2020      Reactions   Zocor [simvastatin - High Dose] Nausea Only   Unknown      Medication List       Accurate as of January 19, 2020  8:18 AM. If you have any questions, ask your nurse or doctor.        STOP taking these medications   potassium chloride 10 MEQ tablet Commonly known as: KLOR-CON Stopped by: JFransisca KaufmannDettinger, MD     TAKE these medications   amLODipine 10 MG tablet Commonly known as: NORVASC Take 1 tablet (10 mg total) by mouth daily.   ascorbic acid 500 MG tablet Commonly known as: VITAMIN C Take 500 mg by mouth daily.   aspirin EC 81 MG tablet Take 81 mg by mouth daily.   atorvastatin 40 MG tablet Commonly known as: LIPITOR TAKE 1 TABLET BY MOUTH EVERY DAY   Calcium Carbonate 500 MG Chew Chew 1 tablet (500 mg total) by mouth 3 (three) times daily as needed. What changed: when to take this   furosemide 80 MG tablet Commonly known as: LASIX Take 1 tablet (80 mg total) by mouth 2 (two) times daily.   glucose blood test  strip Use to check blood glucose once daily.  Dx:  E11.9   insulin detemir 100 UNIT/ML FlexPen Commonly known as: Levemir FlexPen Inject 30 Units into the skin at bedtime.   levothyroxine 175 MCG tablet Commonly known as: SYNTHROID Take 1 tablet (175 mcg total) by mouth daily before breakfast.   metoprolol tartrate 100 MG tablet Commonly known as: LOPRESSOR Take 1 tablet (100 mg total) by mouth 2 (two) times daily.        Objective:   BP (!) 162/64   Pulse 68   Temp 98 F (36.7 C)   Ht '5\' 9"'  (1.753 m)   Wt 243 lb  (110.2 kg)   SpO2 98%   BMI 35.88 kg/m   Wt Readings from Last 3 Encounters:  01/19/20 243 lb (110.2 kg)  12/19/19 246 lb 6.4 oz (111.8 kg)  12/02/19 246 lb 7.6 oz (111.8 kg)    Physical Exam Vitals and nursing note reviewed.  Constitutional:      General: He is not in acute distress.    Appearance: He is well-developed. He is not diaphoretic.  Eyes:     General: No scleral icterus.    Conjunctiva/sclera: Conjunctivae normal.  Neck:     Thyroid: No thyromegaly.  Cardiovascular:     Rate and Rhythm: Normal rate and regular rhythm.     Heart sounds: Normal heart sounds. No murmur.  Pulmonary:     Effort: Pulmonary effort is normal. No respiratory distress.     Breath sounds: Normal breath sounds. No wheezing.  Musculoskeletal:        General: Swelling (1+ pitting) present. Normal range of motion.     Cervical back: Neck supple.  Lymphadenopathy:     Cervical: No cervical adenopathy.  Skin:    General: Skin is warm and dry.     Findings: No rash.  Neurological:     Mental Status: He is alert and oriented to person, place, and time.     Coordination: Coordination normal.  Psychiatric:        Behavior: Behavior normal.       Assessment & Plan:   Problem List Items Addressed This Visit      Cardiovascular and Mediastinum   Essential hypertension, benign   Relevant Orders   CMP14+EGFR     Endocrine   Hypothyroidism   Relevant Orders   TSH   Uncontrolled type 2 diabetes mellitus with stage 3 chronic kidney disease ([place])   Relevant Orders   CBC with Differential/Platelet   CMP14+EGFR   CKD stage 4 due to type 2 diabetes mellitus ([place])   Relevant Orders   CBC with Differential/Platelet   CMP14+EGFR     Other   Mixed hyperlipidemia   Relevant Orders   Lipid panel    Other Visit Diagnoses    DM type 2 causing vascular disease ([place])    -  Primary   Relevant Orders   Bayer DCA Hb A1c Waived   CBC with Differential/Platelet   CMP14+EGFR     Patient's  blood pressure has been normal in the recent hospitalization in the recent surgeries been seen.  At home he says it was 139/64 this morning.  Continue current medication, he sees endocrinology for diabetes and thyroid, will check these today but please CC the results to them.  Follow up plan: Return in about 3 months (around 04/20/2020), or if symptoms worsen or fail to improve, for Hypertension and diabetes.  Counseling provided for all of the vaccine components  Orders Placed This Encounter  Procedures  . Bayer [facility] Hb A1c [place], MD [facility] 01/19/2020, 8:18 AM

## 2020-01-20 LAB — CMP14+EGFR
ALT: 10 IU/L (ref 0–44)
AST: 16 IU/L (ref 0–40)
Albumin/Globulin Ratio: 1 — ABNORMAL LOW (ref 1.2–2.2)
Albumin: 3.2 g/dL — ABNORMAL LOW (ref 3.7–4.7)
Alkaline Phosphatase: 100 IU/L (ref 39–117)
BUN/Creatinine Ratio: 9 — ABNORMAL LOW (ref 10–24)
BUN: 31 mg/dL — ABNORMAL HIGH (ref 8–27)
Bilirubin Total: <0.2 mg/dL (ref 0.0–1.2)
CO2: 19 mmol/L — ABNORMAL LOW (ref 20–29)
Calcium: 7.3 mg/dL — ABNORMAL LOW (ref 8.6–10.2)
Chloride: 106 mmol/L (ref 96–106)
Creatinine, Ser: 3.56 mg/dL (ref 0.76–1.27)
GFR calc Af Amer: 18 mL/min/{1.73_m2} — ABNORMAL LOW (ref >59)
GFR calc non Af Amer: 15 mL/min/{1.73_m2} — ABNORMAL LOW (ref >59)
Globulin, Total: 3.1 g/dL (ref 1.5–4.5)
Glucose: 113 mg/dL — ABNORMAL HIGH (ref 65–99)
Potassium: 4.6 mmol/L (ref 3.5–5.2)
Sodium: 140 mmol/L (ref 134–144)
Total Protein: 6.3 g/dL (ref 6.0–8.5)

## 2020-01-20 LAB — CBC WITH DIFFERENTIAL/PLATELET
Basophils Absolute: 0 10*3/uL (ref 0.0–0.2)
Basos: 0 %
EOS (ABSOLUTE): 0.1 10*3/uL (ref 0.0–0.4)
Eos: 1 %
Hematocrit: 34.2 % — ABNORMAL LOW (ref 37.5–51.0)
Hemoglobin: 11 g/dL — ABNORMAL LOW (ref 13.0–17.7)
Immature Grans (Abs): 0 10*3/uL (ref 0.0–0.1)
Immature Granulocytes: 0 %
Lymphocytes Absolute: 2.3 10*3/uL (ref 0.7–3.1)
Lymphs: 50 %
MCH: 27.6 pg (ref 26.6–33.0)
MCHC: 32.2 g/dL (ref 31.5–35.7)
MCV: 86 fL (ref 79–97)
Monocytes Absolute: 0.4 10*3/uL (ref 0.1–0.9)
Monocytes: 8 %
Neutrophils Absolute: 2 10*3/uL (ref 1.4–7.0)
Neutrophils: 41 %
Platelets: 191 10*3/uL (ref 150–450)
RBC: 3.98 x10E6/uL — ABNORMAL LOW (ref 4.14–5.80)
RDW: 15.4 % (ref 11.6–15.4)
WBC: 4.7 10*3/uL (ref 3.4–10.8)

## 2020-01-20 LAB — LIPID PANEL
Chol/HDL Ratio: 5.3 ratio — ABNORMAL HIGH (ref 0.0–5.0)
Cholesterol, Total: 185 mg/dL (ref 100–199)
HDL: 35 mg/dL — ABNORMAL LOW (ref >39)
LDL Chol Calc (NIH): 127 mg/dL — ABNORMAL HIGH (ref 0–99)
Triglycerides: 124 mg/dL (ref 0–149)
VLDL Cholesterol Cal: 23 mg/dL (ref 5–40)

## 2020-01-20 LAB — TSH: TSH: 4.23 u[IU]/mL (ref 0.450–4.500)

## 2020-01-21 DIAGNOSIS — N184 Chronic kidney disease, stage 4 (severe): Secondary | ICD-10-CM | POA: Diagnosis not present

## 2020-01-21 DIAGNOSIS — I129 Hypertensive chronic kidney disease with stage 1 through stage 4 chronic kidney disease, or unspecified chronic kidney disease: Secondary | ICD-10-CM | POA: Diagnosis not present

## 2020-01-21 DIAGNOSIS — E1122 Type 2 diabetes mellitus with diabetic chronic kidney disease: Secondary | ICD-10-CM | POA: Diagnosis not present

## 2020-01-21 DIAGNOSIS — N2581 Secondary hyperparathyroidism of renal origin: Secondary | ICD-10-CM | POA: Diagnosis not present

## 2020-01-21 DIAGNOSIS — D631 Anemia in chronic kidney disease: Secondary | ICD-10-CM | POA: Diagnosis not present

## 2020-02-10 DIAGNOSIS — I129 Hypertensive chronic kidney disease with stage 1 through stage 4 chronic kidney disease, or unspecified chronic kidney disease: Secondary | ICD-10-CM | POA: Diagnosis not present

## 2020-03-17 ENCOUNTER — Ambulatory Visit: Payer: Medicare Other | Admitting: *Deleted

## 2020-03-17 DIAGNOSIS — IMO0002 Reserved for concepts with insufficient information to code with codable children: Secondary | ICD-10-CM

## 2020-03-17 DIAGNOSIS — E1122 Type 2 diabetes mellitus with diabetic chronic kidney disease: Secondary | ICD-10-CM

## 2020-03-17 DIAGNOSIS — N183 Chronic kidney disease, stage 3 unspecified: Secondary | ICD-10-CM

## 2020-03-17 DIAGNOSIS — I10 Essential (primary) hypertension: Secondary | ICD-10-CM

## 2020-03-17 NOTE — Patient Instructions (Signed)
Visit Information  Goals Addressed            This Visit's Progress   . Chronic Disease Management Needs       CARE PLAN ENTRY (see longtitudinal plan of care for additional care plan information)  Current Barriers:  . Chronic Disease Management support, education, and care coordination needs related to Arthritis, HTN, CAD, Hx of MI & CVA, DM, hypothyroidism, CKD  Clinical Goals: . Over the next 10 days, patient will be contacted by a Care Guide to reschedule their Initial CCM Visit . Over the next 30 days, patient will have an Initial CCM Visit with a member of the embedded CCM team to discuss self-management of their chronic medical conditions  Interventions: . Chart reviewed in preparation for initial visit telephone call . Collaboration with other care team members as needed . Unsuccessful outreach to patient  . I spoke with [name] wife, [name] and advised that we would reach out to reschedule his visit . Request sent to care guides to reach out and reschedule patient's initial visit  Patient Self Care Activities: . Undetermined   Initial goal documentation        Follow-up Plan The care management team will reach out to the patient again over the next 30 days.   [name], BSN, RN-BC Embedded Chronic Care Manager [facility] [place] [facility] Direct Dial: [phone]

## 2020-03-17 NOTE — Chronic Care Management (AMB) (Signed)
  Chronic Care Management   Initial Outreach Note  03/17/2020 Name: Michael Trevino MRN: [id number] DOB: 02-Nov-1941  Referred by: Dettinger, Fransisca Kaufmann, MD Reason for referral : Chronic Care Management (Initial Visit)   An unsuccessful initial telephone visit was attempted today. The patient was referred to the case management team for assistance with care management and care coordination. I spoke with [name] wife, Michael Trevino, by telephone. She had an AWV scheduled at the same time as Mr Toomey CCM call and so they were confused and he left to go help a friend because they thought she was the one waiting on a call.   I asked Michael Trevino if she was aware of any needs related to CCM at this time and she said that she was not.   Follow Up Plan: The care management team will reach out to the patient again over the next 30 days.   [name], BSN, RN-BC Embedded Chronic Care Manager [facility] [place] [facility] Direct Dial: [phone]

## 2020-03-18 ENCOUNTER — Encounter: Payer: Self-pay | Admitting: "Endocrinology

## 2020-03-18 ENCOUNTER — Ambulatory Visit (INDEPENDENT_AMBULATORY_CARE_PROVIDER_SITE_OTHER): Payer: Medicare Other | Admitting: "Endocrinology

## 2020-03-18 VITALS — BP 162/58 | HR 70 | Ht 69.0 in | Wt 245.8 lb

## 2020-03-18 DIAGNOSIS — I10 Essential (primary) hypertension: Secondary | ICD-10-CM | POA: Diagnosis not present

## 2020-03-18 DIAGNOSIS — N183 Chronic kidney disease, stage 3 unspecified: Secondary | ICD-10-CM

## 2020-03-18 DIAGNOSIS — IMO0002 Reserved for concepts with insufficient information to code with codable children: Secondary | ICD-10-CM

## 2020-03-18 DIAGNOSIS — E1122 Type 2 diabetes mellitus with diabetic chronic kidney disease: Secondary | ICD-10-CM

## 2020-03-18 DIAGNOSIS — E782 Mixed hyperlipidemia: Secondary | ICD-10-CM | POA: Diagnosis not present

## 2020-03-18 DIAGNOSIS — E1165 Type 2 diabetes mellitus with hyperglycemia: Secondary | ICD-10-CM | POA: Diagnosis not present

## 2020-03-18 DIAGNOSIS — E039 Hypothyroidism, unspecified: Secondary | ICD-10-CM | POA: Diagnosis not present

## 2020-03-18 NOTE — Patient Instructions (Signed)

## 2020-03-18 NOTE — Progress Notes (Signed)
03/18/2020, 9:30 AM   Endocrinology follow-up note   Subjective:    Patient ID: Michael Trevino, male    DOB: 1941/04/15.  he is being seen  in follow-up for management of currently uncontrolled symptomatic type 2 diabetes, hyperlipidemia, hypertension, and hypothyroidism. PMD:   Michael Trevino, Michael Kaufmann, MD.   Past Medical History:  Diagnosis Date  . Arthritis   . Diabetes mellitus without complication ([place])   . Heart attack ([place])   . Hyperlipidemia   . Hypertension   . MI (myocardial infarction) ([place]) 2000  . Renal disorder   . Sepsis ([place])   . Sepsis due to Streptococcus, group B ([place]) 02/24/2013  . Stroke ([place])   . Thyroid disease    hypothyroid   Past Surgical History:  Procedure Laterality Date  . TEE WITHOUT CARDIOVERSION N/A 02/26/2013   Procedure: TRANSESOPHAGEAL ECHOCARDIOGRAM (TEE);  Surgeon: Thayer Headings, MD;  Location: [facility] ENDOSCOPY;  Service: Cardiovascular;  Laterality: N/A;   Social History   Socioeconomic History  . Marital status: Married    Spouse name: Not on file  . Number of children: 6  . Years of education: 60  . Highest education level: 9th grade  Occupational History  . 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 Smoking status: Former Smoker    Packs/day: 0.50    Years: 30.00    Pack years: 15.00    Types: Cigarettes    Quit date: 11/05/1984    Years since quitting: 35.3  . Smokeless tobacco: Never Used  Substance and Sexual Activity  . Alcohol use: No  . Drug use: No  . Sexual activity: Yes  Other Topics Concern  . Not on file  Social History Narrative  . Not on file   Social Determinants of Health   Financial Resource Strain:   . Difficulty of Paying Living Expenses:   Food Insecurity:   . Worried About Charity fundraiser in the Last Year:   . Academic librarian in the Last Year:   Transportation Needs:   . Film/video editor (Medical):   Marland Kitchen Lack of Transportation (Non-Medical):   Physical Activity:   . Days of Exercise per Week:   . Minutes of Exercise per Session:   Stress:   . Feeling of Stress :   Social Connections:   . Frequency of Communication with Friends and Family:   . Frequency of Social Gatherings with Friends and Family:   . Attends Religious Services:   . Active Member of Clubs or Organizations:   . Attends Archivist Meetings:   Marland Kitchen Marital Status:    Outpatient Encounter Medications as of 03/18/2020  Medication Sig  . amLODipine (NORVASC) 10 MG tablet Take 1 tablet (10 mg total) by mouth daily.  Marland Kitchen ascorbic acid (VITAMIN C) 500 MG tablet Take 500 mg by mouth daily.  Marland Kitchen aspirin EC 81 MG tablet Take 81 mg by mouth daily.  Marland Kitchen atorvastatin (LIPITOR) 40 MG tablet TAKE 1 TABLET BY MOUTH EVERY DAY  .  Calcium Carbonate 500 MG CHEW Chew 1 tablet (500 mg total) by mouth 3 (three) times daily as needed. (Patient taking differently: Chew 500 mg by mouth daily. )  . furosemide (LASIX) 80 MG tablet Take 1 tablet (80 mg total) by mouth 2 (two) times daily.  Marland Kitchen glucose blood test strip Use to check blood glucose once daily.  Dx:  E11.9  . Insulin Detemir (LEVEMIR FLEXPEN) 100 UNIT/ML Pen Inject 30 Units into the skin at bedtime.  Marland Kitchen levothyroxine (SYNTHROID) 175 MCG tablet Take 1 tablet (175 mcg total) by mouth daily before breakfast.  . metoprolol tartrate (LOPRESSOR) 100 MG tablet Take 1 tablet (100 mg total) by mouth 2 (two) times daily.   No facility-administered encounter medications on file as of 03/18/2020.    ALLERGIES: Allergies  Allergen Reactions  . Zocor [Simvastatin - High Dose] Nausea Only    Unknown    VACCINATION STATUS: Immunization History  Administered Date(s) Administered  . Influenza Whole 08/25/2008  . Influenza, High Dose Seasonal PF 08/30/2016, 08/16/2017, 08/19/2018  . Influenza,inj,Quad PF,6+ Mos  08/19/2014, 08/25/2015  . Pneumococcal Conjugate-13 04/06/2015  . Pneumococcal Polysaccharide-23 10/06/2005, 12/12/2007    Diabetes He presents for his follow-up diabetic visit. He has type 2 diabetes mellitus. Onset time: He was diagnosed at approximate age of 45 years. His disease course has been worsening. There are no hypoglycemic associated symptoms. Pertinent negatives for hypoglycemia include no confusion, headaches, pallor or seizures. Pertinent negatives for diabetes include no blurred vision, no chest pain, no fatigue, no polydipsia, no polyphagia, no polyuria and no weakness. There are no hypoglycemic complications. Symptoms are worsening. Diabetic complications include heart disease and nephropathy. Risk factors for coronary artery disease include diabetes mellitus, dyslipidemia, family history, hypertension, male sex, obesity, sedentary lifestyle and tobacco exposure. Current diabetic treatment includes insulin injections and oral agent (dual therapy). His weight is fluctuating minimally. He is following a generally unhealthy diet. When asked about meal planning, he reported none. He has not had a previous visit with a dietitian. He never participates in exercise. His breakfast blood glucose range is generally 130-140 mg/dl. His bedtime blood glucose range is generally 130-140 mg/dl. His overall blood glucose range is 130-140 mg/dl. (He returns with controlled glycemic profile both fasting and postprandial, with his A1c is 8.7% unchanged from his last visit A1c of 8.5%.  He is known to have discrepant EAG and HbA1c.) An ACE inhibitor/angiotensin II receptor blocker is being taken. He does not see a podiatrist.Eye exam is current.  Hyperlipidemia This is a chronic problem. The current episode started more than 1 year ago. Exacerbating diseases include diabetes, hypothyroidism and obesity. Pertinent negatives include no chest pain, myalgias or shortness of breath. Current antihyperlipidemic  treatment includes statins. Risk factors for coronary artery disease include diabetes mellitus, dyslipidemia, hypertension, male sex, family history, obesity and a sedentary lifestyle.  Hypertension This is a chronic problem. The current episode started more than 1 year ago. The problem is uncontrolled. Pertinent negatives include no blurred vision, chest pain, headaches, neck pain, palpitations or shortness of breath. Risk factors for coronary artery disease include dyslipidemia, diabetes mellitus, family history, male gender, obesity, sedentary lifestyle and smoking/tobacco exposure. Past treatments include angiotensin blockers and calcium channel blockers. Hypertensive end-organ damage includes kidney disease and CAD/MI. Identifiable causes of hypertension include a thyroid problem.  Thyroid Problem Presents for follow-up visit. Patient reports no diarrhea, fatigue, heat intolerance, hoarse voice or palpitations. Symptom course: Is currently on levothyroxine 175 mcg p.o. daily before breakfast.  He admits to some inconsistency in taking his medication. His past medical history is significant for diabetes and hyperlipidemia.    Review of systems  Constitutional: + Minimally fluctuating body weight,  current  Body mass index is 36.3 kg/m. , no fatigue, no subjective hyperthermia, no subjective hypothermia Eyes: no blurry vision, no xerophthalmia ENT: no sore throat, no nodules palpated in throat, no dysphagia/odynophagia, no hoarseness Cardiovascular: no Chest Pain, no Shortness of Breath, no palpitations, no leg swelling Respiratory: no cough, no shortness of breath Gastrointestinal: no Nausea/Vomiting/Diarhhea Musculoskeletal: no muscle/joint aches Skin: no rashes Neurological: no tremors, no numbness, no tingling, no dizziness, + patient is hard of hearing. Psychiatric: no depression, no anxiety   Objective:    BP (!) 162/58   Pulse 70   Ht 5\' 9"  (1.753 m)   Wt 245 lb 12.8 oz (111.5  kg)   BMI 36.30 kg/m   Wt Readings from Last 3 Encounters:  03/18/20 245 lb 12.8 oz (111.5 kg)  01/19/20 243 lb (110.2 kg)  12/19/19 246 lb 6.4 oz (111.8 kg)     Physical Exam- Limited  Constitutional:  Body mass index is 36.3 kg/m. , not in acute distress, normal state of mind Eyes:  EOMI, no exophthalmos Neck: Supple Respiratory: Adequate breathing efforts Musculoskeletal: No gross deformities, strength intact in all 4 extremities.  No gross restrictions of joint movements.   Skin:  no rashes, no hyperemia Neurological: no tremor with outstretched hands, + hard of hearing.  CMP     Component Value Date/Time   NA 140 01/19/2020 0951   K 4.6 01/19/2020 0951   CL 106 01/19/2020 0951   CO2 19 (L) 01/19/2020 0951   GLUCOSE 113 (H) 01/19/2020 0951   GLUCOSE 163 (H) 12/02/2019 0553   BUN 31 (H) 01/19/2020 0951   CREATININE 3.56 (HH) 01/19/2020 0951   CALCIUM 7.3 (L) 01/19/2020 0951   PROT 6.3 01/19/2020 0951   ALBUMIN 3.2 (L) 01/19/2020 0951   AST 16 01/19/2020 0951   ALT 10 01/19/2020 0951   ALKPHOS 100 01/19/2020 0951   BILITOT <0.2 01/19/2020 0951   GFRNONAA 15 (L) 01/19/2020 0951   GFRAA 18 (L) 01/19/2020 0951     Diabetic Labs (most recent): Lab Results  Component Value Date   HGBA1C 8.7 (H) 01/19/2020   HGBA1C 8.5 (A) 12/19/2019   HGBA1C 7.4 07/11/2019     Lipid Panel ( most recent) Lipid Panel     Component Value Date/Time   CHOL 185 01/19/2020 0951   CHOL 108 04/11/2013 1153   TRIG 124 01/19/2020 0951   TRIG 98 07/17/2013 0940   TRIG 131 04/11/2013 1153   HDL 35 (L) 01/19/2020 0951   HDL 31 (L) 07/17/2013 0940   HDL 33 (L) 04/11/2013 1153   CHOLHDL 5.3 (H) 01/19/2020 0951   LDLCALC 127 (H) 01/19/2020 0951   LDLCALC 49 07/17/2013 0940   LDLCALC 49 04/11/2013 1153      Lab Results  Component Value Date   TSH 4.230 01/19/2020   TSH 4.988 (H) 09/24/2019   TSH 0.49 07/11/2019   TSH 0.459 04/11/2019   TSH 0.07 (A) 12/13/2018   TSH 5.540 (H)  08/26/2018   TSH 6.390 (H) 04/10/2018   TSH 8.370 (H) 01/07/2018   TSH 8.040 (H) 08/16/2017   TSH 7.640 (H) 04/16/2017   FREET4 1.42 04/10/2018   FREET4 1.22 01/07/2018      Assessment & Plan:   1. DM type 2 causing vascular disease , Stage 3  renal insufficiency.  - Patient has currently uncontrolled symptomatic type 2 DM since  79 years of age. - Patient reports near target glycemic profile both fasting and postprandial, no major hypoglycemia nor hyperglycemia.   He brings in a meter and logs showing consistently controlled glycemic profile both fasting and postprandial.    His previsit A1c is 8.7%, unchanged from 8.5%.  He did have interval hospitalization for pneumonia, however he is known to have discrepant EEG and A1c before.    -Recent labs reviewed.  -his diabetes is complicated by coronary artery disease, stage 3-4 renal insufficiency, obesity/sedentary life, illiteracy  and Michael Trevino remains at a high risk for more acute and chronic complications which include CAD, CVA, CKD, retinopathy, and neuropathy. These are all discussed in detail with the patient.  - I have counseled him on diet management and weight loss, by adopting a carbohydrate restricted/protein rich diet. -He still admits to dietary indiscretions including consumption of sweetened beverages.  - he  admits there is a room for improvement in his diet and drink choices. -  Suggestion is made for him to avoid simple carbohydrates  from his diet including Cakes, Sweet Desserts / Pastries, Ice Cream, Soda (diet and regular), Sweet Tea, Candies, Chips, Cookies, Sweet Pastries,  Store Bought Juices, Alcohol in Excess of  1-2 drinks a day, Artificial Sweeteners, Coffee Creamer, and "Sugar-free" Products. This will help patient to have stable blood glucose profile and potentially avoid unintended weight gain.  - I encouraged him to switch to  unprocessed or minimally processed complex starch and increased protein intake  (animal or plant source), fruits, and vegetables.  - he is advised to stick to a routine mealtimes to eat 3 meals  a day and avoid unnecessary snacks ( to snack only to correct hypoglycemia).    - I have approached him with the following individualized plan to manage diabetes and patient agrees:    - Patient is functionally illiterate, being helped by his elderly wife who can read and write.   -The #1 goal in his diabetes management will still be to avoid hypoglycemia.  -Target A1c for him will be between 8% and 8.5%.   -All attempts should be to keep treatment simple for them to follow.  -Given his presentation with his recent EEG near target, he is advised to continue  Levemir 30 units nightly, advised to continue monitoring blood glucose twice a day-daily before breakfast and before bedtime.    -Patient is encouraged to call clinic for blood glucose levels less than 70 or above 300 mg /dl. -He is advised to continue Tradjenta 5 mg p.o. daily with breakfast, and glimepiride 2 mg daily with breakfast.    -He is not a candidate for Metformin or SGLT2 inhibitors due to concurrent CKD.   - Patient specific target  A1c;  LDL, HDL, Triglycerides, and  Waist Circumference were discussed in detail.  2) BP/HTN: -His blood pressure is not controlled to target.   He is advised to continue his current medications, including amlodipine 10 mg p.o. daily, Lasix 80 mg p.o. twice daily, irbesartan 300 mg p.o. daily, and metoprolol 100 mg p.o. twice daily.    3) Lipids/HPL : His most recent lipid panel showed controlled LDL at 63.  He is advised to continue atorvastatin 40 mg p.o. daily at bedtime.  Side effects and precautions discussed with him.      4)  Weight/Diet: His BMI 53.6-RWERXVQ complicating his diabetes care.  He is a candidate  for modest weight loss.  CDE Consult will be initiated , exercise, and detailed carbohydrates information provided.  5) hypothyroidism:  -His previsit thyroid  function tests are consistent with inadequate replacement.  However, patient admits to inconsistency taking his levothyroxine which is at high dose of 175 mcg p.o. daily before breakfast.   - We discussed about the correct intake of his thyroid hormone, on empty stomach at fasting, with water, separated by at least 30 minutes from breakfast and other medications,  and separated by more than 4 hours from calcium, iron, multivitamins, acid reflux medications (PPIs). -Patient is made aware of the fact that thyroid hormone replacement is needed for life, dose to be adjusted by periodic monitoring of thyroid function tests.  6) Chronic Care/Health Maintenance:  -he  is on ACEI/ARB and Statin medications and  is encouraged to continue to follow up with Ophthalmology, Dentist,  Podiatrist at least yearly or according to recommendations, and advised to  stay away from smoking. I have recommended yearly flu vaccine and pneumonia vaccination at least every 5 years; and  sleep for at least 7 hours a day. - I advised patient to maintain close follow up with Michael Trevino, Michael Kaufmann, MD for primary care needs.  - Time spent on this patient care encounter:  35 min, of which > 50% was spent in  counseling and the rest reviewing his blood glucose logs , discussing his hypoglycemia and hyperglycemia episodes, reviewing his current and  previous labs / studies  ( including abstraction from other facilities) and medications  doses and developing a  long term treatment plan and documenting his care.   Please refer to Patient Instructions for Blood Glucose Monitoring and Insulin/Medications Dosing Guide"  in media tab for additional information. Please  also refer to " Patient Self Inventory" in the Media  tab for reviewed elements of pertinent patient history.  Michael Trevino participated in the discussions, expressed understanding, and voiced agreement with the above plans.  All questions were answered to his satisfaction. he is  encouraged to contact clinic should he have any questions or concerns prior to his return visit.  Follow up plan: - Return in about 3 months (around 06/18/2020) for Bring Meter and Logs- A1c in Office.   Michael Lloyd, MD [facility] [address] [place], [place] [address] Phone: [phone]  Fax: [phone]    03/18/2020, 9:30 AM  This note was partially dictated with voice recognition software. Similar sounding words can be transcribed inadequately or may not  be corrected upon review.

## 2020-04-05 DIAGNOSIS — N189 Chronic kidney disease, unspecified: Secondary | ICD-10-CM | POA: Diagnosis not present

## 2020-04-05 DIAGNOSIS — N184 Chronic kidney disease, stage 4 (severe): Secondary | ICD-10-CM | POA: Diagnosis not present

## 2020-04-05 DIAGNOSIS — I129 Hypertensive chronic kidney disease with stage 1 through stage 4 chronic kidney disease, or unspecified chronic kidney disease: Secondary | ICD-10-CM | POA: Diagnosis not present

## 2020-04-05 DIAGNOSIS — N2581 Secondary hyperparathyroidism of renal origin: Secondary | ICD-10-CM | POA: Diagnosis not present

## 2020-04-05 DIAGNOSIS — E1122 Type 2 diabetes mellitus with diabetic chronic kidney disease: Secondary | ICD-10-CM | POA: Diagnosis not present

## 2020-04-05 DIAGNOSIS — D631 Anemia in chronic kidney disease: Secondary | ICD-10-CM | POA: Diagnosis not present

## 2020-04-18 ENCOUNTER — Other Ambulatory Visit: Payer: Self-pay | Admitting: "Endocrinology

## 2020-04-22 ENCOUNTER — Other Ambulatory Visit: Payer: Self-pay | Admitting: Family Medicine

## 2020-04-23 ENCOUNTER — Other Ambulatory Visit: Payer: Self-pay

## 2020-04-23 ENCOUNTER — Encounter: Payer: Self-pay | Admitting: Family Medicine

## 2020-04-23 ENCOUNTER — Ambulatory Visit (INDEPENDENT_AMBULATORY_CARE_PROVIDER_SITE_OTHER): Payer: Medicare Other | Admitting: Family Medicine

## 2020-04-23 VITALS — BP 166/56 | HR 65 | Temp 98.6°F | Ht 69.0 in | Wt 240.5 lb

## 2020-04-23 DIAGNOSIS — I10 Essential (primary) hypertension: Secondary | ICD-10-CM

## 2020-04-23 DIAGNOSIS — N184 Chronic kidney disease, stage 4 (severe): Secondary | ICD-10-CM | POA: Diagnosis not present

## 2020-04-23 DIAGNOSIS — E782 Mixed hyperlipidemia: Secondary | ICD-10-CM | POA: Diagnosis not present

## 2020-04-23 DIAGNOSIS — E1165 Type 2 diabetes mellitus with hyperglycemia: Secondary | ICD-10-CM | POA: Diagnosis not present

## 2020-04-23 DIAGNOSIS — N183 Chronic kidney disease, stage 3 unspecified: Secondary | ICD-10-CM | POA: Diagnosis not present

## 2020-04-23 DIAGNOSIS — E1122 Type 2 diabetes mellitus with diabetic chronic kidney disease: Secondary | ICD-10-CM | POA: Diagnosis not present

## 2020-04-23 DIAGNOSIS — E039 Hypothyroidism, unspecified: Secondary | ICD-10-CM | POA: Diagnosis not present

## 2020-04-23 DIAGNOSIS — IMO0002 Reserved for concepts with insufficient information to code with codable children: Secondary | ICD-10-CM

## 2020-04-23 LAB — BAYER DCA HB A1C WAIVED: HB A1C (BAYER DCA - WAIVED): 7.5 % — ABNORMAL HIGH (ref <7.0)

## 2020-04-23 NOTE — Progress Notes (Signed)
BP (!) 166/56   Pulse 65   Temp 98.6 F (37 C) (Temporal)   Ht 5' 9" (1.753 m)   Wt 240 lb 8 oz (109.1 kg)   BMI 35.52 kg/m    Subjective:   Patient ID: Michael Trevino, male    DOB: 03/16/1941, 79 y.o.   MRN: [id number]  HPI: Michael Trevino is a 79 y.o. male presenting on 04/23/2020 for Medical Management of Chronic Issues   HPI Type 2 diabetes mellitus Patient comes in today for recheck of his diabetes. Patient has been currently taking Levemir 30 units nightly. Patient is not currently on an ACE inhibitor/ARB. Patient has not seen an ophthalmologist this year. Patient denies any issues with their feet. The symptom started onset as an adult hypertension and hyperlipidemia and CKD stage IV ARE RELATED TO DM   Hyperlipidemia Patient is coming in for recheck of his hyperlipidemia. The patient is currently taking atorvastatin. They deny any issues with myalgias or history of liver damage from it. They deny any focal numbness or weakness or chest pain.   Hypertension Patient is currently on amlodipine and metoprolol, and their blood pressure today is 166/56 but just recently had a visit with nephrology his blood pressure was a lot better. Patient denies any lightheadedness or dizziness. Patient denies headaches, blurred vision, chest pains, shortness of breath, or weakness. Denies any side effects from medication and is content with current medication.   Hypothyroidism recheck Patient is coming in for thyroid recheck today as well. They deny any issues with hair changes or heat or cold problems or diarrhea or constipation. They deny any chest pain or palpitations. They are currently on levothyroxine 174mcrograms   Relevant past medical, surgical, family and social history reviewed and updated as indicated. Interim medical history since our last visit reviewed. Allergies and medications reviewed and updated.  Review of Systems  Constitutional: Negative for chills and fever.  Eyes:  Negative for visual disturbance.  Respiratory: Negative for shortness of breath and wheezing.   Cardiovascular: Positive for leg swelling. Negative for chest pain.  Musculoskeletal: Negative for back pain and gait problem.  Skin: Negative for rash.  Neurological: Negative for dizziness, weakness and numbness.  All other systems reviewed and are negative.   Per HPI unless specifically indicated above   Allergies as of 04/23/2020      Reactions   Zocor [simvastatin - High Dose] Nausea Only   Unknown      Medication List       Accurate as of April 23, 2020  9:10 AM. If you have any questions, ask your nurse or doctor.        amLODipine 10 MG tablet Commonly known as: NORVASC Take 1 tablet (10 mg total) by mouth daily.   ascorbic acid 500 MG tablet Commonly known as: VITAMIN C Take 500 mg by mouth daily.   aspirin EC 81 MG tablet Take 81 mg by mouth daily.   atorvastatin 40 MG tablet Commonly known as: LIPITOR TAKE 1 TABLET BY MOUTH EVERY DAY   calcitRIOL 0.5 MCG capsule Commonly known as: ROCALTROL Take 0.5 mcg by mouth daily.   Calcium Carbonate 500 MG Chew Chew 1 tablet (500 mg total) by mouth 3 (three) times daily as needed. What changed: when to take this   furosemide 80 MG tablet Commonly known as: LASIX Take 1 tablet (80 mg total) by mouth 2 (two) times daily.   glucose blood test strip Use to check blood glucose once daily.  Dx:  E11.9   insulin detemir 100 UNIT/ML FlexPen Commonly known as: Levemir FlexPen Inject 30 Units into the skin at bedtime.   levothyroxine 175 MCG tablet Commonly known as: SYNTHROID TAKE 1 TABLET (175 MCG TOTAL) BY MOUTH DAILY BEFORE BREAKFAST.   metoprolol tartrate 100 MG tablet Commonly known as: LOPRESSOR Take 1 tablet (100 mg total) by mouth 2 (two) times daily.        Objective:   BP (!) 166/56   Pulse 65   Temp 98.6 F (37 C) (Temporal)   Ht 5' 9" (1.753 m)   Wt 240 lb 8 oz (109.1 kg)   BMI 35.52 kg/m     Wt Readings from Last 3 Encounters:  04/23/20 240 lb 8 oz (109.1 kg)  03/18/20 245 lb 12.8 oz (111.5 kg)  01/19/20 243 lb (110.2 kg)    Physical Exam Vitals and nursing note reviewed.  Constitutional:      General: He is not in acute distress.    Appearance: He is well-developed. He is not diaphoretic.  Eyes:     General: No scleral icterus.    Conjunctiva/sclera: Conjunctivae normal.  Neck:     Thyroid: No thyromegaly.  Cardiovascular:     Rate and Rhythm: Normal rate and regular rhythm.     Heart sounds: Normal heart sounds. No murmur heard.   Pulmonary:     Effort: Pulmonary effort is normal. No respiratory distress.     Breath sounds: Normal breath sounds. No wheezing.  Musculoskeletal:        General: Swelling (2+ pitting edema bilateral lower extremity) present. Normal range of motion.     Cervical back: Neck supple.  Lymphadenopathy:     Cervical: No cervical adenopathy.  Skin:    General: Skin is warm and dry.     Findings: No rash.  Neurological:     Mental Status: He is alert and oriented to person, place, and time.     Coordination: Coordination normal.  Psychiatric:        Behavior: Behavior normal.       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 (HCC) - Primary   Relevant Medications   calcitRIOL (ROCALTROL) 0.5 MCG capsule   Other Relevant Orders    Bayer DCA Hb A1c Waived   Microalbumin / creatinine urine ratio   TSH   CMP14+EGFR   CBC with Differential/Platelet   CKD stage 4 due to type 2 diabetes mellitus (HCC)     Other   Mixed hyperlipidemia   Relevant Medications   calcitRIOL (ROCALTROL) 0.5 MCG capsule   Other Relevant Orders   Bayer DCA Hb A1c Waived   Microalbumin / creatinine urine ratio   TSH   CMP14+EGFR   CBC with Differential/Platelet      A1c 7.5, is much  improved from previous, previous was 8.7, will continue current course of treatment, will also do referral to [facility] pharmacist for additional help.  Patient possibly could be a candidate for GLP-1 Follow up plan: Return in about 3 months (around 07/24/2020), or if symptoms worsen or fail to improve, for Diabetes and CKD and cholesterol and hypertension.  Counseling provided for all of the vaccine components Orders Placed This Encounter  Procedures  . Bayer DCA Hb A1c Waived  . Microalbumin / creatinine urine ratio    Caryl Pina, MD [facility] 04/23/2020, 9:10 AM

## 2020-04-24 LAB — MICROALBUMIN / CREATININE URINE RATIO
Creatinine, Urine: 97 mg/dL
Microalb/Creat Ratio: 6889 mg/g creat — ABNORMAL HIGH (ref 0–29)
Microalbumin, Urine: 6682 ug/mL

## 2020-04-24 LAB — CMP14+EGFR
ALT: 18 IU/L (ref 0–44)
AST: 18 IU/L (ref 0–40)
Albumin/Globulin Ratio: 1.2 (ref 1.2–2.2)
Albumin: 3.1 g/dL — ABNORMAL LOW (ref 3.7–4.7)
Alkaline Phosphatase: 94 IU/L (ref 48–121)
BUN/Creatinine Ratio: 12 (ref 10–24)
BUN: 50 mg/dL — ABNORMAL HIGH (ref 8–27)
Bilirubin Total: <0.2 mg/dL (ref 0.0–1.2)
CO2: 18 mmol/L — ABNORMAL LOW (ref 20–29)
Calcium: 7.8 mg/dL — ABNORMAL LOW (ref 8.6–10.2)
Chloride: 108 mmol/L — ABNORMAL HIGH (ref 96–106)
Creatinine, Ser: 4.03 mg/dL (ref 0.76–1.27)
GFR calc Af Amer: 15 mL/min/{1.73_m2} — ABNORMAL LOW (ref >59)
GFR calc non Af Amer: 13 mL/min/{1.73_m2} — ABNORMAL LOW (ref >59)
Globulin, Total: 2.6 g/dL (ref 1.5–4.5)
Glucose: 118 mg/dL — ABNORMAL HIGH (ref 65–99)
Potassium: 4.5 mmol/L (ref 3.5–5.2)
Sodium: 139 mmol/L (ref 134–144)
Total Protein: 5.7 g/dL — ABNORMAL LOW (ref 6.0–8.5)

## 2020-04-24 LAB — CBC WITH DIFFERENTIAL/PLATELET
Basophils Absolute: 0 10*3/uL (ref 0.0–0.2)
Basos: 0 %
EOS (ABSOLUTE): 0.1 10*3/uL (ref 0.0–0.4)
Eos: 1 %
Hematocrit: 32.2 % — ABNORMAL LOW (ref 37.5–51.0)
Hemoglobin: 10.5 g/dL — ABNORMAL LOW (ref 13.0–17.7)
Immature Grans (Abs): 0 10*3/uL (ref 0.0–0.1)
Immature Granulocytes: 0 %
Lymphocytes Absolute: 1.8 10*3/uL (ref 0.7–3.1)
Lymphs: 46 %
MCH: 28.2 pg (ref 26.6–33.0)
MCHC: 32.6 g/dL (ref 31.5–35.7)
MCV: 87 fL (ref 79–97)
Monocytes Absolute: 0.3 10*3/uL (ref 0.1–0.9)
Monocytes: 8 %
Neutrophils Absolute: 1.7 10*3/uL (ref 1.4–7.0)
Neutrophils: 45 %
Platelets: 184 10*3/uL (ref 150–450)
RBC: 3.72 x10E6/uL — ABNORMAL LOW (ref 4.14–5.80)
RDW: 15.2 % (ref 11.6–15.4)
WBC: 3.9 10*3/uL (ref 3.4–10.8)

## 2020-04-24 LAB — TSH: TSH: 3.81 u[IU]/mL (ref 0.450–4.500)

## 2020-04-27 ENCOUNTER — Other Ambulatory Visit: Payer: Self-pay | Admitting: Family

## 2020-04-27 NOTE — Progress Notes (Signed)
Patient see's [facility] in [place].  Creatinine elevated. Keep Nephrologists follow up.

## 2020-05-05 ENCOUNTER — Ambulatory Visit (INDEPENDENT_AMBULATORY_CARE_PROVIDER_SITE_OTHER): Payer: Medicare Other | Admitting: *Deleted

## 2020-05-05 DIAGNOSIS — N184 Chronic kidney disease, stage 4 (severe): Secondary | ICD-10-CM

## 2020-05-05 DIAGNOSIS — I10 Essential (primary) hypertension: Secondary | ICD-10-CM

## 2020-05-05 DIAGNOSIS — IMO0002 Reserved for concepts with insufficient information to code with codable children: Secondary | ICD-10-CM

## 2020-05-05 DIAGNOSIS — E1165 Type 2 diabetes mellitus with hyperglycemia: Secondary | ICD-10-CM | POA: Diagnosis not present

## 2020-05-05 DIAGNOSIS — E1122 Type 2 diabetes mellitus with diabetic chronic kidney disease: Secondary | ICD-10-CM | POA: Diagnosis not present

## 2020-05-05 NOTE — Chronic Care Management (AMB) (Signed)
Chronic Care Management   Initial Visit Note  05/05/2020 Name: Michael Trevino MRN: [id number] DOB: 1941-03-08  Referred by: Dettinger, Fransisca Kaufmann, MD Reason for referral : Chronic Care Management (Initial Visit)   Michael Trevino is a 79 y.o. year old male who is a primary care patient of Dettinger, Fransisca Kaufmann, MD. The CCM team was consulted for assistance with chronic disease management and care coordination needs related to Arthritis, HTN, CAD, Hx of MI & CVA, DM, hypothyroidism, CKD  Review of patient status, including review of consultants reports, relevant laboratory and other test results, and collaboration with appropriate care team members and the patient's provider was performed as part of comprehensive patient evaluation and provision of chronic care management services.    Subjective: I spoke with Michael Trevino by telephone today regarding management of his chronic medical conditions.   SDOH (Social Determinants of Health) assessments performed: Yes See Care Plan activities for detailed interventions related to SDOH  SDOH Interventions     Most Recent Value  SDOH Interventions  Physical Activity Interventions Other (Comments)  [per patient, he wears out easily]       Objective: Outpatient Encounter Medications as of 05/05/2020  Medication Sig Note  . amLODipine (NORVASC) 10 MG tablet Take 1 tablet (10 mg total) by mouth daily.   Marland Kitchen ascorbic acid (VITAMIN C) 500 MG tablet Take 500 mg by mouth daily.   Marland Kitchen aspirin EC 81 MG tablet Take 81 mg by mouth daily.   Marland Kitchen atorvastatin (LIPITOR) 40 MG tablet TAKE 1 TABLET BY MOUTH EVERY DAY   . calcitRIOL (ROCALTROL) 0.5 MCG capsule Take 0.5 mcg by mouth daily.   . Calcium Carbonate 500 MG CHEW Chew 1 tablet (500 mg total) by mouth 3 (three) times daily as needed. (Patient taking differently: Chew 500 mg by mouth daily. )   . furosemide (LASIX) 80 MG tablet Take 1 tablet (80 mg total) by mouth 2 (two) times daily.   Marland Kitchen glucose blood test strip Use to  check blood glucose once daily.  Dx:  E11.9   . Insulin Detemir (LEVEMIR FLEXPEN) 100 UNIT/ML Pen Inject 30 Units into the skin at bedtime.   Marland Kitchen levothyroxine (SYNTHROID) 175 MCG tablet TAKE 1 TABLET (175 MCG TOTAL) BY MOUTH DAILY BEFORE BREAKFAST.   . metoprolol tartrate (LOPRESSOR) 100 MG tablet Take 1 tablet (100 mg total) by mouth 2 (two) times daily. 11/30/2019: Patients wife is not  sure of what time patient took medication   No facility-administered encounter medications on file as of 05/05/2020.     Lab Results  Component Value Date   HGBA1C 7.5 (H) 04/23/2020   HGBA1C 8.7 (H) 01/19/2020   HGBA1C 8.5 (A) 12/19/2019   Lab Results  Component Value Date   MICROALBUR 6,261.3 12/13/2018   LDLCALC 127 (H) 01/19/2020   CREATININE 4.03 (HH) 04/23/2020   BP Readings from Last 3 Encounters:  04/23/20 (!) 166/56  03/18/20 (!) 162/58  01/19/20 (!) 151/55   Lab Results  Component Value Date   CREATININE 4.03 ([place]) 04/23/2020   BUN 50 (H) 04/23/2020   NA 139 04/23/2020   K 4.5 04/23/2020   CL 108 (H) 04/23/2020   CO2 18 (L) 04/23/2020     RN Care Plan   .  "I don't want to fall" (pt-stated)        Current Barriers:  Marland Kitchen Knowledge Deficits related to fall precautions . Impaired balance  Nurse Case Manager Clinical Goal(s):  Marland Kitchen Over the next 60 days,  patient will demonstrate improved adherence to prescribed treatment plan for decreasing falls as evidenced by patient reporting and review of EMR . Over the next 60 days, patient will verbalize using fall risk reduction strategies discussed . Over the next 90 days, patient will not experience any falls  Interventions:  . Provided written and verbal education re: Potential causes of falls and Fall prevention strategies . Reviewed medications and discussed potential side effects of medications such as dizziness and frequent urination . Assessed for s/s of orthostatic hypotension . Assessed for falls since last encounter. . Assessed  patients knowledge of fall risk prevention secondary to previously provided education. . Encouraged patient to:  Marland Kitchen De-clutter walkways . Change positions slowly . Wear secure fitting shoes at all times with ambulation . Utilize home lighting for dim lit areas . Have self and pet awareness at all times  Initial goal documentation     .  Chronic Disease Management Needs        CARE PLAN ENTRY (see longtitudinal plan of care for additional care plan information)  Current Barriers:  . Chronic Disease Management support, education, and care coordination needs related to Arthritis, HTN, CAD, Hx of MI & CVA, DM, hypothyroidism, CKD  Clinical Goal(s) related to Arthritis, HTN, CAD, Hx of MI & CVA, DM, hypothyroidism, CKD:  Over the next 60 days, patient will:  . Work with the care management team to address educational, disease management, and care coordination needs  . Begin or continue self health monitoring activities as directed today Measure and record cbg (blood glucose) two times daily and Measure and record blood pressure 3 times per week . Call provider office for new or worsened signs and symptoms Blood glucose findings outside established parameters and Blood pressure findings outside established parameters . Call care management team with questions or concerns . Verbalize basic understanding of patient centered plan of care established today  Interventions related to Arthritis, HTN, CAD, Hx of MI & CVA, DM, hypothyroidism, CKD:  . Evaluation of current treatment plans and patient's adherence to plan as established by provider . Assessed patient understanding of disease states . Assessed patient's education and care coordination needs . Provided disease specific education to patient  . Collaborated with appropriate clinical care team members regarding patient needs  Patient Self Care Activities related to Arthritis, HTN, CAD, Hx of MI & CVA, DM, hypothyroidism, CKD:  . Patient is  unable to independently self-manage chronic health conditions  Initial goal documentation         Plan:   The care management team will reach out to the patient again over the next 60 days.    [name], BSN, RN-BC Embedded Chronic Care Manager [facility] Direct Dial: [phone]

## 2020-05-05 NOTE — Patient Instructions (Signed)
Visit Information  Goals Addressed              This Visit's Progress     Patient Stated   .  "I don't want to fall" (pt-stated)        Current Barriers:  Marland Kitchen Knowledge Deficits related to fall precautions . Impaired balance  Nurse Case Manager Clinical Goal(s):  Marland Kitchen Over the next 60 days, patient will demonstrate improved adherence to prescribed treatment plan for decreasing falls as evidenced by patient reporting and review of EMR . Over the next 60 days, patient will verbalize using fall risk reduction strategies discussed . Over the next 90 days, patient will not experience any falls  Interventions:  . Provided written and verbal education re: Potential causes of falls and Fall prevention strategies . Reviewed medications and discussed potential side effects of medications such as dizziness and frequent urination . Assessed for s/s of orthostatic hypotension . Assessed for falls since last encounter. . Assessed patients knowledge of fall risk prevention secondary to previously provided education. . Encouraged patient to:  Marland Kitchen De-clutter walkways . Change positions slowly . Wear secure fitting shoes at all times with ambulation . Utilize home lighting for dim lit areas . Have self and pet awareness at all times  Initial goal documentation       Other   .  Chronic Disease Management Needs        CARE PLAN ENTRY (see longtitudinal plan of care for additional care plan information)  Current Barriers:  . Chronic Disease Management support, education, and care coordination needs related to Arthritis, HTN, CAD, Hx of MI & CVA, DM, hypothyroidism, CKD  Clinical Goal(s) related to Arthritis, HTN, CAD, Hx of MI & CVA, DM, hypothyroidism, CKD:  Over the next 60 days, patient will:  . Work with the care management team to address educational, disease management, and care coordination needs  . Begin or continue self health monitoring activities as directed today Measure and record  cbg (blood glucose) two times daily and Measure and record blood pressure 3 times per week . Call provider office for new or worsened signs and symptoms Blood glucose findings outside established parameters and Blood pressure findings outside established parameters . Call care management team with questions or concerns . Verbalize basic understanding of patient centered plan of care established today  Interventions related to Arthritis, HTN, CAD, Hx of MI & CVA, DM, hypothyroidism, CKD:  . Evaluation of current treatment plans and patient's adherence to plan as established by provider . Assessed patient understanding of disease states . Assessed patient's education and care coordination needs . Provided disease specific education to patient  . Collaborated with appropriate clinical care team members regarding patient needs  Patient Self Care Activities related to Arthritis, HTN, CAD, Hx of MI & CVA, DM, hypothyroidism, CKD:  . Patient is unable to independently self-manage chronic health conditions  Initial goal documentation        Michael Trevino was given information about Chronic Care Management services today including:  1. CCM service includes personalized support from designated clinical staff supervised by his physician, including individualized plan of care and coordination with other care providers 2. 24/7 contact phone numbers for assistance for urgent and routine care needs. 3. Service will only be billed when office clinical staff spend 20 minutes or more in a month to coordinate care. 4. Only one practitioner may furnish and bill the service in a calendar month. 5. The patient may stop CCM services  at any time (effective at the end of the month) by phone call to the office staff. 6. The patient will be responsible for cost sharing (co-pay) of up to 20% of the service fee (after annual deductible is met).  Patient agreed to services and verbal consent obtained.   The patient  verbalized understanding of instructions provided today and declined a print copy of patient instruction materials.   The care management team will reach out to the patient again over the next 60 days.   [name], BSN, RN-BC Embedded Chronic Care Manager [facility] Direct Dial: [phone]

## 2020-05-20 ENCOUNTER — Other Ambulatory Visit: Payer: Self-pay | Admitting: *Deleted

## 2020-05-20 MED ORDER — FUROSEMIDE 80 MG PO TABS: 80.0000 mg | ORAL_TABLET | Freq: Two times a day (BID) | ORAL | 1 refills | Status: DC

## 2020-06-13 ENCOUNTER — Other Ambulatory Visit: Payer: Self-pay | Admitting: Family Medicine

## 2020-06-23 ENCOUNTER — Encounter: Payer: Self-pay | Admitting: Nurse Practitioner

## 2020-06-23 ENCOUNTER — Other Ambulatory Visit: Payer: Self-pay

## 2020-06-23 ENCOUNTER — Ambulatory Visit (INDEPENDENT_AMBULATORY_CARE_PROVIDER_SITE_OTHER): Payer: Medicare Other | Admitting: Nurse Practitioner

## 2020-06-23 VITALS — BP 196/68 | HR 72 | Ht 69.0 in | Wt 242.2 lb

## 2020-06-23 DIAGNOSIS — N184 Chronic kidney disease, stage 4 (severe): Secondary | ICD-10-CM

## 2020-06-23 DIAGNOSIS — I10 Essential (primary) hypertension: Secondary | ICD-10-CM

## 2020-06-23 DIAGNOSIS — E1121 Type 2 diabetes mellitus with diabetic nephropathy: Secondary | ICD-10-CM | POA: Diagnosis not present

## 2020-06-23 DIAGNOSIS — E1122 Type 2 diabetes mellitus with diabetic chronic kidney disease: Secondary | ICD-10-CM | POA: Diagnosis not present

## 2020-06-23 DIAGNOSIS — E782 Mixed hyperlipidemia: Secondary | ICD-10-CM

## 2020-06-23 DIAGNOSIS — E039 Hypothyroidism, unspecified: Secondary | ICD-10-CM

## 2020-06-23 DIAGNOSIS — E1165 Type 2 diabetes mellitus with hyperglycemia: Secondary | ICD-10-CM

## 2020-06-23 DIAGNOSIS — IMO0002 Reserved for concepts with insufficient information to code with codable children: Secondary | ICD-10-CM

## 2020-06-23 DIAGNOSIS — Z794 Long term (current) use of insulin: Secondary | ICD-10-CM

## 2020-06-23 MED ORDER — INSULIN DETEMIR 100 UNIT/ML FLEXPEN: 25.0000 [IU] | PEN_INJECTOR | Freq: Every day | SUBCUTANEOUS | 2 refills | Status: AC

## 2020-06-23 NOTE — Progress Notes (Signed)
06/23/2020, 8:55 AM   Endocrinology follow-up note   Subjective:    Patient ID: Michael Trevino, male    DOB: 21-May-1941.  he is being seen  in follow-up for management of currently uncontrolled symptomatic type 2 diabetes, hyperlipidemia, hypertension, and hypothyroidism. PMD:   Michael Trevino, Michael Kaufmann, MD.   Past Medical History:  Diagnosis Date   Arthritis    Diabetes mellitus without complication ([place])    Heart attack ([place])    Hyperlipidemia    Hypertension    MI (myocardial infarction) ([place]) 2000   Renal disorder    Sepsis ([place])    Sepsis due to Streptococcus, group B ([place]) 02/24/2013   Stroke [facility])    Thyroid disease    hypothyroid   Past Surgical History:  Procedure Laterality Date   TEE WITHOUT CARDIOVERSION N/A 02/26/2013   Procedure: TRANSESOPHAGEAL ECHOCARDIOGRAM (TEE);  Surgeon: Thayer Headings, MD;  Location: [facility] ENDOSCOPY;  Service: Cardiovascular;  Laterality: N/A;   Social History   Socioeconomic History   Marital status: Married    Spouse name: Not on file   Number of children: 6   Years of education: 9   Highest education level: 9th grade  Occupational History   Occupation: Retired    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  Tobacco Use   Smoking status: Former Smoker    Packs/day: 0.50    Years: 30.00    Pack years: 15.00    Types: Cigarettes    Quit date: 11/05/1984    Years since quitting: 35.6   Smokeless tobacco: Never Used  Vaping Use   Vaping Use: Never used  Substance and Sexual Activity   Alcohol use: No   Drug use: No   Sexual activity: Yes  Other Topics Concern   Not on file  Social History Narrative   Not on file   Social Determinants of Health   Financial Resource Strain: Low Risk    Difficulty of Paying Living Expenses: Not hard at all  Food Insecurity: No Food  Insecurity   Worried About Char[employer] fundraiser in the Last Year: Never true   [place] in the Last Year: Never true  Transportation Needs: No Transportation Needs   Lack of Transportation (Medical): No   Lack of Transportation (Non-Medical): No  Physical Activity: Inactive   Days of Exercise per Week: 0 days   Minutes of Exercise per Session: 0 min  Stress: No Stress Concern Present   Feeling of Stress : Not at all  Social Connections: Moderately Integrated   Frequency of Communication with Friends and Family: More than three times a week   Frequency of Social Gatherings with Friends and Family: More than three times a week   Attends Religious Services: More than 4 times per year   Active Member of [employer] or Organizations: No   Attends Archivist Meetings: Never   Marital Status: Married   Outpatient Encounter Medications as of 06/23/2020  Medication Sig   amLODipine (NORVASC) 10 MG tablet TAKE 1 TABLET BY MOUTH EVERY DAY   ascorbic  acid (VITAMIN C) 500 MG tablet Take 500 mg by mouth daily.   aspirin EC 81 MG tablet Take 81 mg by mouth daily.   atorvastatin (LIPITOR) 40 MG tablet TAKE 1 TABLET BY MOUTH EVERY DAY   calcitRIOL (ROCALTROL) 0.5 MCG capsule Take 0.5 mcg by mouth daily.   Calcium Carbonate 500 MG CHEW Chew 1 tablet (500 mg total) by mouth 3 (three) times daily as needed. (Patient taking differently: Chew 500 mg by mouth daily. )   furosemide (LASIX) 80 MG tablet Take 1 tablet (80 mg total) by mouth 2 (two) times daily.   glucose blood test strip Use to check blood glucose once daily.  Dx:  E11.9   insulin detemir (LEVEMIR FLEXPEN) 100 UNIT/ML FlexPen Inject 25 Units into the skin at bedtime.   levothyroxine (SYNTHROID) 175 MCG tablet TAKE 1 TABLET (175 MCG TOTAL) BY MOUTH DAILY BEFORE BREAKFAST.   metoprolol tartrate (LOPRESSOR) 100 MG tablet Take 1 tablet (100 mg total) by mouth 2 (two) times daily.   [DISCONTINUED] Insulin Detemir  (LEVEMIR FLEXPEN) 100 UNIT/ML Pen Inject 30 Units into the skin at bedtime.   No facility-administered encounter medications on file as of 06/23/2020.    ALLERGIES: Allergies  Allergen Reactions   Zocor [Simvastatin - High Dose] Nausea Only    Unknown    VACCINATION STATUS: Immunization History  Administered Date(s) Administered   Influenza Whole 08/25/2008   Influenza, High Dose Seasonal PF 08/30/2016, 08/16/2017, 08/19/2018   Influenza,inj,Quad PF,6+ Mos 08/19/2014, 08/25/2015, 09/23/2019   Moderna SARS-COVID-2 Vaccination 01/08/2020, 02/06/2020   Pneumococcal Conjugate-13 04/06/2015   Pneumococcal Polysaccharide-23 10/06/2005, 12/12/2007    Diabetes He presents for his follow-up diabetic visit. He has type 2 diabetes mellitus. Onset time: He was diagnosed at approximate age of 55 years. His disease course has been improving. Hypoglycemia symptoms include hunger. Pertinent negatives for hypoglycemia include no confusion, headaches, pallor or tremors. Pertinent negatives for diabetes include no blurred vision, no chest pain, no fatigue, no polydipsia, no polyphagia, no polyuria, no weakness and no weight loss. There are no hypoglycemic complications. Symptoms are improving. Diabetic complications include a CVA, heart disease and nephropathy. Risk factors for coronary artery disease include diabetes mellitus, dyslipidemia, family history, hypertension, male sex, obesity, sedentary lifestyle and tobacco exposure. Current diabetic treatment includes insulin injections (Was taken off his Tradjenta and glimepiride during the hospitalization since last visit.). His weight is fluctuating minimally. He is following a generally unhealthy diet. When asked about meal planning, he reported none. He has not had a previous visit with a dietitian. He never participates in exercise. His breakfast blood glucose range is generally 90-110 mg/dl. His overall blood glucose range is 110-130 mg/dl. (He  presents today with his meter and logs showing at target fasting and postprandial glycemic profile.  He has been hospitalized since last visit to the clinic and was taken off of his Tradjenta and his glimepiride.  He does have some mild fasting hypoglycemia noted.  His most recent A1c was 7.5% on 04/23/2020.) An ACE inhibitor/angiotensin II receptor blocker is not being taken. He does not see a podiatrist.Eye exam is current.  Hyperlipidemia This is a chronic problem. The current episode started more than 1 year ago. The problem is uncontrolled. Recent lipid tests were reviewed and are high. Exacerbating diseases include chronic renal disease, diabetes, hypothyroidism and obesity. Factors aggravating his hyperlipidemia include fatty foods. Associated symptoms include a focal weakness. Pertinent negatives include no chest pain, myalgias or shortness of breath. Current antihyperlipidemic treatment  includes statins. The current treatment provides mild improvement of lipids. Compliance problems include adherence to diet and psychosocial issues.  Risk factors for coronary artery disease include diabetes mellitus, dyslipidemia, hypertension, male sex, family history, obesity and a sedentary lifestyle.  Hypertension This is a chronic problem. The current episode started more than 1 year ago. The problem is uncontrolled. Pertinent negatives include no blurred vision, chest pain, headaches, neck pain, palpitations or shortness of breath. There are no associated agents to hypertension. Risk factors for coronary artery disease include dyslipidemia, diabetes mellitus, family history, male gender, obesity, sedentary lifestyle and smoking/tobacco exposure. Past treatments include calcium channel blockers, diuretics and beta blockers. The current treatment provides mild improvement. Compliance problems include diet and psychosocial issues.  Hypertensive end-organ damage includes kidney disease, CAD/MI and CVA. Identifiable  causes of hypertension include chronic renal disease and a thyroid problem.  Thyroid Problem Presents for follow-up visit. Patient reports no cold intolerance, constipation, diarrhea, fatigue, heat intolerance, hoarse voice, palpitations, tremors or weight loss. The symptoms have been improving (He reports he has been taking his thyroid hormone with breakfast). His past medical history is significant for diabetes and hyperlipidemia.    Review of systems  Review of Systems  Constitutional: Negative for fatigue and weight loss.  HENT: Negative for hoarse voice.   Eyes: Negative for blurred vision.  Respiratory: Negative for shortness of breath.   Cardiovascular: Negative for chest pain and palpitations.  Gastrointestinal: Negative for constipation and diarrhea.  Musculoskeletal: Negative for myalgias and neck pain.  Skin: Negative for pallor.  Neurological: Positive for focal weakness. Negative for tremors, weakness and headaches.       Patient reports focal weakness in legs after walking short distances.  He walks with a cane.  Endo/Heme/Allergies: Negative for cold intolerance, heat intolerance, polydipsia and polyphagia.  Psychiatric/Behavioral: Positive for memory loss. Negative for confusion.     Objective:    BP (!) 196/68 (BP Location: Right Arm, Patient Position: Sitting)    Pulse 72    Ht 5\' 9"  (1.753 m)    Wt 242 lb 3.2 oz (109.9 kg)    BMI 35.77 kg/m   Wt Readings from Last 3 Encounters:  06/23/20 242 lb 3.2 oz (109.9 kg)  04/23/20 240 lb 8 oz (109.1 kg)  03/18/20 245 lb 12.8 oz (111.5 kg)    BP Readings from Last 3 Encounters:  06/23/20 (!) 196/68  04/23/20 (!) 166/56  03/18/20 (!) 162/58     Physical Exam- Limited  Constitutional:  Body mass index is 35.77 kg/m. , not in acute distress, normal state of mind, HOH, illiterate Eyes:  EOMI, no exophthalmos Neck: Supple Thyroid: No gross goiter Cardiovascular: RRR, no murmers, rubs, or gallops, no  edema Respiratory: Adequate breathing efforts, no crackles, rales, rhonchi, or wheezing Musculoskeletal: no gross deformities, strength intact in all four extremities, no gross restriction of joint movements, walks with a cane Skin:  no rashes, no hyperemia Neurological: no tremor with outstretched hands   CMP     Component Value Date/Time   NA 139 04/23/2020 0919   K 4.5 04/23/2020 0919   CL 108 (H) 04/23/2020 0919   CO2 18 (L) 04/23/2020 0919   GLUCOSE 118 (H) 04/23/2020 0919   GLUCOSE 163 (H) 12/02/2019 0553   BUN 50 (H) 04/23/2020 0919   CREATININE 4.03 (HH) 04/23/2020 0919   CALCIUM 7.8 (L) 04/23/2020 0919   PROT 5.7 (L) 04/23/2020 0919   ALBUMIN 3.1 (L) 04/23/2020 0919   AST 18 04/23/2020  0919   ALT 18 04/23/2020 0919   ALKPHOS 94 04/23/2020 0919   BILITOT <0.2 04/23/2020 0919   GFRNONAA 13 (L) 04/23/2020 0919   GFRAA 15 (L) 04/23/2020 0919     Diabetic Labs (most recent): Lab Results  Component Value Date   HGBA1C 7.5 (H) 04/23/2020   HGBA1C 8.7 (H) 01/19/2020   HGBA1C 8.5 (A) 12/19/2019     Lipid Panel ( most recent) Lipid Panel     Component Value Date/Time   CHOL 185 01/19/2020 0951   CHOL 108 04/11/2013 1153   TRIG 124 01/19/2020 0951   TRIG 98 07/17/2013 0940   TRIG 131 04/11/2013 1153   HDL 35 (L) 01/19/2020 0951   HDL 31 (L) 07/17/2013 0940   HDL 33 (L) 04/11/2013 1153   CHOLHDL 5.3 (H) 01/19/2020 0951   LDLCALC 127 (H) 01/19/2020 0951   LDLCALC 49 07/17/2013 0940   LDLCALC 49 04/11/2013 1153      Lab Results  Component Value Date   TSH 3.810 04/23/2020   TSH 4.230 01/19/2020   TSH 4.988 (H) 09/24/2019   TSH 0.49 07/11/2019   TSH 0.459 04/11/2019   TSH 0.07 (A) 12/13/2018   TSH 5.540 (H) 08/26/2018   TSH 6.390 (H) 04/10/2018   TSH 8.370 (H) 01/07/2018   TSH 8.040 (H) 08/16/2017   FREET4 1.42 04/10/2018   FREET4 1.22 01/07/2018      Assessment & Plan:   1. DM type 2 causing vascular disease , Stage 3 renal insufficiency.  -  Patient has currently uncontrolled symptomatic type 2 DM since  79 years of age.  He presents today with his meter and logs showing at target fasting and postprandial glycemic profile.  He has been hospitalized since last visit to the clinic and was taken off of his Tradjenta and his glimepiride.  He does have some mild fasting hypoglycemia noted.  His most recent A1c was 7.5% on 04/23/2020.  -Recent labs reviewed.  -his diabetes is complicated by coronary artery disease, stage 3-4 renal insufficiency, obesity/sedentary life, illiteracy  and Forrester Blando remains at a high risk for more acute and chronic complications which include CAD, CVA, CKD, retinopathy, and neuropathy. These are all discussed in detail with the patient.  - The patient admits there is a room for improvement in their diet and drink choices. -  Suggestion is made for the patient to avoid simple carbohydrates from their diet including Cakes, Sweet Desserts / Pastries, Ice Cream, Soda (diet and regular), Sweet Tea, Candies, Chips, Cookies, Sweet Pastries,  Store Bought Juices, Alcohol in Excess of  1-2 drinks a day, Artificial Sweeteners, Coffee Creamer, and "Sugar-free" Products. This will help patient to have stable blood glucose profile and potentially avoid unintended weight gain.   - I encouraged the patient to switch to  unprocessed or minimally processed complex starch and increased protein intake (animal or plant source), fruits, and vegetables.   - Patient is advised to stick to a routine mealtimes to eat 3 meals  a day and avoid unnecessary snacks ( to snack only to correct hypoglycemia).  - I have approached him with the following individualized plan to manage diabetes and patient agrees:    - Patient is functionally illiterate, being helped by his elderly wife who can read and write.   -The #1 goal in his diabetes management will still be to avoid hypoglycemia.  -Target A1c for him will be between 8% and 8.5%.   -All  attempts should be to keep  treatment simple for them to follow.   -Given his at target and tight fasting glycemic profile at times, will lower dose of Levemir to 25 units SQ daily at bedtime.  He is advised to check blood glucose at least twice a day, before breakfast and before bed.  He has been instructed to call the clinic if his blood glucose is less than 70 or above 200 for 3 tests in a row.    -He has done well off of his Tradjenta and glimepiride, will avoid these for now.  -He is not a candidate for Metformin or SGLT2 inhibitors due to concurrent CKD.   - Patient specific target  A1c;  LDL, HDL, Triglycerides, and  Waist Circumference were discussed in detail.  2) BP/HTN: His blood pressure is not controlled to target.  He says he did take his medications this morning.  He has a follow-up with his nephrologist next week.  Will defer to them for any medication changes. He is advised to continue metoprolol 100 mg p.o. twice daily, Lasix 80 mg p.o. twice daily, and Norvasc 10 mg p.o. daily.  3) Lipids/HPL :  His most recent lipid profile shows LDL uncontrolled at 127 on 01/19/2020.  He is advised to continue atorvastatin 40 mg p.o. nightly.  Encouraged patient to avoid fatty fried foods and butter.  Side effects and precautions of statin drugs discussed with him.  Will consider dosage change if LDL consistently remains high on next visit.  4)  Weight/Diet:  His BMI 75.1-WCHENID complicating his diabetes care.  He is a candidate for modest weight loss.  CDE Consult will be initiated , exercise, and detailed carbohydrates information provided.  5) Hypothyroidism: His previsit thyroid function tests are consistent with an adequate replacement.  He reports he has not been taking his medication as instructed and has been taking his thyroid hormone with breakfast.  He is advised to continue on same dose of levothyroxine at 175 mcg p.o. daily before breakfast.  - We discussed about the correct  intake of his thyroid hormone, on empty stomach at fasting, with water, separated by at least 30 minutes from breakfast and other medications,  and separated by more than 4 hours from calcium, iron, multivitamins, acid reflux medications (PPIs). -Patient is made aware of the fact that thyroid hormone replacement is needed for life, dose to be adjusted by periodic monitoring of thyroid function tests.  6) Chronic Care/Health Maintenance:  -he  is on Statin medications and  is encouraged to continue to follow up with Ophthalmology, Dentist,  Podiatrist at least yearly or according to recommendations, and advised to  stay away from smoking. I have recommended yearly flu vaccine and pneumonia vaccination at least every 5 years; and  sleep for at least 7 hours a day. - I advised patient to maintain close follow up with Michael Trevino, Michael Kaufmann, MD for primary care needs.  - Time spent on this patient care encounter:  35 min, of which > 50% was spent in  counseling and the rest reviewing his blood glucose logs , discussing his hypoglycemia and hyperglycemia episodes, reviewing his current and  previous labs / studies  ( including abstraction from other facilities) and medications  doses and developing a  long term treatment plan and documenting his care.   Please refer to Patient Instructions for Blood Glucose Monitoring and Insulin/Medications Dosing Guide"  in media tab for additional information. Please  also refer to " Patient Self Inventory" in the Media  tab  for reviewed elements of pertinent patient history.  [name] participated in the discussions, expressed understanding, and voiced agreement with the above plans.  All questions were answered to his satisfaction. he is encouraged to contact clinic should he have any questions or concerns prior to his return visit.  Follow up plan: - Return in about 4 months (around 10/30/2020) for Diabetes follow up with A1c in office, Bring glucometer and logs, No  previsit labs.   Rayetta Pigg, FNP-BC [facility] Phone: [phone] Fax: [phone]   06/23/2020, 8:55 AM  This note was partially dictated with voice recognition software. Similar sounding words can be transcribed inadequately or may not  be corrected upon review.

## 2020-06-23 NOTE — Patient Instructions (Signed)
Advice for Weight Management  -For most of us the best way to lose weight is by diet management. Generally speaking, diet management means consuming less calories intentionally which over time brings about progressive weight loss.  This can be achieved more effectively by restricting carbohydrate consumption to the minimum possible.  So, it is critically important to know your numbers: how much calorie you are consuming and how much calorie you need. More importantly, our carbohydrates sources should be unprocessed or minimally processed complex starch food items.   Sometimes, it is important to balance nutrition by increasing protein intake (animal or plant source), fruits, and vegetables.  -Sticking to a routine mealtime to eat 3 meals a day and avoiding unnecessary snacks is shown to have a big role in weight control. Under normal circumstances, the only time we lose real weight is when we are hungry, so allow hunger to take place- hunger means no food between meal times, only water.  It is not advisable to starve.   -It is better to avoid simple carbohydrates including: Cakes, Sweet Desserts, Ice Cream, Soda (diet and regular), Sweet Tea, Candies, Chips, Cookies, Store Bought Juices, Alcohol in Excess of  1-2 drinks a day, Artificial Sweeteners, Doughnuts, Coffee Creamers, "Sugar-free" Products, etc, etc.  This is not a complete list.....    -Consulting with certified diabetes educators is proven to provide you with the most accurate and current information on diet.  Also, you may be  interested in discussing diet options/exchanges , we can schedule a visit with Michael Trevino, RDN, CDE for individualized nutrition education.  -Exercise: If you are able: 30 -60 minutes a day ,4 days a week, or 150 minutes a week.  The longer the better.  Combine stretch, strength, and aerobic activities.  If you were told in the past that you have high risk for cardiovascular diseases, you may seek evaluation by  your heart doctor prior to initiating moderate to intense exercise programs.    

## 2020-07-02 DIAGNOSIS — I129 Hypertensive chronic kidney disease with stage 1 through stage 4 chronic kidney disease, or unspecified chronic kidney disease: Secondary | ICD-10-CM | POA: Diagnosis not present

## 2020-07-02 DIAGNOSIS — N2581 Secondary hyperparathyroidism of renal origin: Secondary | ICD-10-CM | POA: Diagnosis not present

## 2020-07-02 DIAGNOSIS — N184 Chronic kidney disease, stage 4 (severe): Secondary | ICD-10-CM | POA: Diagnosis not present

## 2020-07-02 DIAGNOSIS — N189 Chronic kidney disease, unspecified: Secondary | ICD-10-CM | POA: Diagnosis not present

## 2020-07-02 DIAGNOSIS — D631 Anemia in chronic kidney disease: Secondary | ICD-10-CM | POA: Diagnosis not present

## 2020-07-12 ENCOUNTER — Other Ambulatory Visit: Payer: Self-pay

## 2020-07-12 ENCOUNTER — Ambulatory Visit (INDEPENDENT_AMBULATORY_CARE_PROVIDER_SITE_OTHER): Payer: Medicare Other | Admitting: Family Medicine

## 2020-07-12 ENCOUNTER — Encounter: Payer: Self-pay | Admitting: Family Medicine

## 2020-07-12 VITALS — BP 168/56 | HR 73 | Temp 97.0°F | Ht 69.0 in | Wt 243.0 lb

## 2020-07-12 DIAGNOSIS — N184 Chronic kidney disease, stage 4 (severe): Secondary | ICD-10-CM

## 2020-07-12 DIAGNOSIS — IMO0002 Reserved for concepts with insufficient information to code with codable children: Secondary | ICD-10-CM

## 2020-07-12 DIAGNOSIS — I10 Essential (primary) hypertension: Secondary | ICD-10-CM

## 2020-07-12 DIAGNOSIS — E1122 Type 2 diabetes mellitus with diabetic chronic kidney disease: Secondary | ICD-10-CM

## 2020-07-12 DIAGNOSIS — E039 Hypothyroidism, unspecified: Secondary | ICD-10-CM

## 2020-07-12 DIAGNOSIS — E1165 Type 2 diabetes mellitus with hyperglycemia: Secondary | ICD-10-CM | POA: Diagnosis not present

## 2020-07-12 DIAGNOSIS — E782 Mixed hyperlipidemia: Secondary | ICD-10-CM

## 2020-07-12 DIAGNOSIS — I5022 Chronic systolic (congestive) heart failure: Secondary | ICD-10-CM

## 2020-07-12 DIAGNOSIS — D631 Anemia in chronic kidney disease: Secondary | ICD-10-CM

## 2020-07-12 LAB — BAYER DCA HB A1C WAIVED: HB A1C (BAYER DCA - WAIVED): 7.7 % — ABNORMAL HIGH (ref <7.0)

## 2020-07-12 MED ORDER — HYDRALAZINE HCL 10 MG PO TABS: 10.0000 mg | ORAL_TABLET | Freq: Three times a day (TID) | ORAL | 3 refills | Status: DC

## 2020-07-12 MED ORDER — NITROGLYCERIN 0.4 MG SL SUBL: 0.4000 mg | SUBLINGUAL_TABLET | SUBLINGUAL | 3 refills | Status: DC | PRN

## 2020-07-12 NOTE — Progress Notes (Signed)
BP (!) 168/56   Pulse 73   Temp (!) 97 F (36.1 C)   Ht 5' 9" (1.753 m)   Wt 243 lb (110.2 kg)   SpO2 98%   BMI 35.88 kg/m    Subjective:   Patient ID: Michael Trevino, male    DOB: [birth year], 79 y.o.   MRN: [id number]  HPI: Michael Trevino is a 79 y.o. male presenting on 07/12/2020 for Medical Management of Chronic Issues, Diabetes, Chronic Kidney Disease, and Hypertension   HPI Hypertension Patient is currently on amlodipine and metoprolol, and their blood pressure today is 168/56. Patient denies any lightheadedness or dizziness. Patient denies headaches, blurred vision, chest pains, shortness of breath, or weakness. Denies any side effects from medication and is content with current medication.  Patient does have a cardiologist  Type 2 diabetes mellitus Patient comes in today for recheck of his diabetes. Patient has been currently taking Levemir. Patient is not currently on an ACE inhibitor/ARB. Patient has not seen an ophthalmologist this year. Patient denies any issues with their feet. The symptom started onset as an adult CKD stage IV and hypertension and hypothyroidism and hyperlipidemia ARE RELATED TO DM, patient sees endocrinology and nephrology  Hypothyroidism recheck Patient is coming in for thyroid recheck today as well. They deny any issues with hair changes or heat or cold problems or diarrhea or constipation. They deny any chest pain or palpitations. They are currently on levothyroxine 175 micrograms, patient sees endocrinology  Hyperlipidemia Patient is coming in for recheck of his hyperlipidemia. The patient is currently taking atorvastatin. They deny any issues with myalgias or history of liver damage from it. They deny any focal numbness or weakness.  Patient is complaining of some angina at times and wants nitroglycerin just in case he needs it.  His current prescription is expired.  He denies any chest pain today  Relevant past medical, surgical, family and social  history reviewed and updated as indicated. Interim medical history since our last visit reviewed. Allergies and medications reviewed and updated.  Review of Systems  Constitutional: Negative for chills and fever.  Eyes: Negative for visual disturbance.  Respiratory: Negative for shortness of breath and wheezing.   Cardiovascular: Negative for chest pain and leg swelling.  Musculoskeletal: Negative for back pain and gait problem.  Skin: Negative for rash.  Neurological: Negative for dizziness, weakness and numbness.  All other systems reviewed and are negative.   Per HPI unless specifically indicated above   Allergies as of 07/12/2020      Reactions   Zocor [simvastatin - High Dose] Nausea Only   Unknown      Medication List       Accurate as of July 12, 2020 11:42 AM. If you have any questions, ask your nurse or doctor.        amLODipine 10 MG tablet Commonly known as: NORVASC TAKE 1 TABLET BY MOUTH EVERY DAY   ascorbic acid 500 MG tablet Commonly known as: VITAMIN C Take 500 mg by mouth daily.   aspirin EC 81 MG tablet Take 81 mg by mouth daily.   atorvastatin 40 MG tablet Commonly known as: LIPITOR TAKE 1 TABLET BY MOUTH EVERY DAY   calcitRIOL 0.5 MCG capsule Commonly known as: ROCALTROL Take 0.5 mcg by mouth daily.   Calcium Carbonate 500 MG Chew Chew 1 tablet (500 mg total) by mouth 3 (three) times daily as needed. What changed: when to take this   furosemide 80 MG tablet Commonly known  as: LASIX Take 1 tablet (80 mg total) by mouth 2 (two) times daily.   glucose blood test strip Use to check blood glucose once daily.  Dx:  E11.9   insulin detemir 100 UNIT/ML FlexPen Commonly known as: Levemir FlexPen Inject 25 Units into the skin at bedtime.   levothyroxine 175 MCG tablet Commonly known as: SYNTHROID TAKE 1 TABLET (175 MCG TOTAL) BY MOUTH DAILY BEFORE BREAKFAST.   metoprolol tartrate 100 MG tablet Commonly known as: LOPRESSOR Take 1 tablet  (100 mg total) by mouth 2 (two) times daily.        Objective:   BP (!) 168/56   Pulse 73   Temp (!) 97 F (36.1 C)   Ht 5' 9" (1.753 m)   Wt 243 lb (110.2 kg)   SpO2 98%   BMI 35.88 kg/m   Wt Readings from Last 3 Encounters:  07/12/20 243 lb (110.2 kg)  06/23/20 242 lb 3.2 oz (109.9 kg)  04/23/20 240 lb 8 oz (109.1 kg)    Physical Exam Vitals and nursing note reviewed.  Constitutional:      General: He is not in acute distress.    Appearance: He is well-developed. He is not diaphoretic.  Eyes:     General: No scleral icterus.    Conjunctiva/sclera: Conjunctivae normal.  Neck:     Thyroid: No thyromegaly.  Cardiovascular:     Rate and Rhythm: Normal rate and regular rhythm.     Heart sounds: Normal heart sounds. No murmur heard.   Pulmonary:     Effort: Pulmonary effort is normal. No respiratory distress.     Breath sounds: Normal breath sounds. No wheezing.  Musculoskeletal:        General: No swelling.     Cervical back: Neck supple.  Lymphadenopathy:     Cervical: No cervical adenopathy.  Skin:    General: Skin is warm and dry.     Findings: No rash.  Neurological:     Mental Status: He is alert and oriented to person, place, and time.     Coordination: Coordination normal.  Psychiatric:        Behavior: Behavior normal.       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) - Primary   Relevant Orders   Bayer DCA Hb A1c Waived (Completed)   CMP14+EGFR   CKD stage 4 due to type 2 diabetes mellitus (HCC)    Relevant Orders   CBC with Differential/Platelet     Other   Mixed hyperlipidemia   Relevant Medications   hydrALAZINE (APRESOLINE) 10 MG tablet   nitroGLYCERIN (NITROSTAT) 0.4 MG SL tablet    Other Visit Diagnoses    Anemia due to stage 4 chronic kidney disease (HCC)          Will give prescription for nitroglycerin, also started the patient on hydralazine for blood pressure, he has a visiting nurse will keep track of blood pressure and give [place] a call with the numbers Follow up plan: Return in about  3 months (around 10/12/2020), or if symptoms worsen or fail to improve, for htn.  Counseling provided for all of the vaccine components Orders Placed This Encounter  Procedures  . Bayer [facility] Hb A1c Douglas, MD [facility] 07/12/2020, 11:42 AM

## 2020-07-13 ENCOUNTER — Other Ambulatory Visit: Payer: Self-pay | Admitting: Family

## 2020-07-13 LAB — CBC WITH DIFFERENTIAL/PLATELET
Basophils Absolute: 0 10*3/uL (ref 0.0–0.2)
Basos: 0 %
EOS (ABSOLUTE): 0.1 10*3/uL (ref 0.0–0.4)
Eos: 2 %
Hematocrit: 30.2 % — ABNORMAL LOW (ref 37.5–51.0)
Hemoglobin: 10.1 g/dL — ABNORMAL LOW (ref 13.0–17.7)
Immature Grans (Abs): 0 10*3/uL (ref 0.0–0.1)
Immature Granulocytes: 0 %
Lymphocytes Absolute: 2.7 10*3/uL (ref 0.7–3.1)
Lymphs: 51 %
MCH: 29.2 pg (ref 26.6–33.0)
MCHC: 33.4 g/dL (ref 31.5–35.7)
MCV: 87 fL (ref 79–97)
Monocytes Absolute: 0.4 10*3/uL (ref 0.1–0.9)
Monocytes: 7 %
Neutrophils Absolute: 2.1 10*3/uL (ref 1.4–7.0)
Neutrophils: 40 %
Platelets: 198 10*3/uL (ref 150–450)
RBC: 3.46 x10E6/uL — ABNORMAL LOW (ref 4.14–5.80)
RDW: 15.1 % (ref 11.6–15.4)
WBC: 5.3 10*3/uL (ref 3.4–10.8)

## 2020-07-13 LAB — CMP14+EGFR
ALT: 14 IU/L (ref 0–44)
AST: 19 IU/L (ref 0–40)
Albumin/Globulin Ratio: 1.1 — ABNORMAL LOW (ref 1.2–2.2)
Albumin: 3.4 g/dL — ABNORMAL LOW (ref 3.7–4.7)
Alkaline Phosphatase: 80 IU/L (ref 48–121)
BUN/Creatinine Ratio: 12 (ref 10–24)
BUN: 54 mg/dL — ABNORMAL HIGH (ref 8–27)
Bilirubin Total: <0.2 mg/dL (ref 0.0–1.2)
CO2: 20 mmol/L (ref 20–29)
Calcium: 7.4 mg/dL — ABNORMAL LOW (ref 8.6–10.2)
Chloride: 110 mmol/L — ABNORMAL HIGH (ref 96–106)
Creatinine, Ser: 4.56 mg/dL (ref 0.76–1.27)
GFR calc Af Amer: 13 mL/min/{1.73_m2} — ABNORMAL LOW (ref >59)
GFR calc non Af Amer: 11 mL/min/{1.73_m2} — ABNORMAL LOW (ref >59)
Globulin, Total: 3.2 g/dL (ref 1.5–4.5)
Glucose: 96 mg/dL (ref 65–99)
Potassium: 4.7 mmol/L (ref 3.5–5.2)
Sodium: 142 mmol/L (ref 134–144)
Total Protein: 6.6 g/dL (ref 6.0–8.5)

## 2020-07-27 ENCOUNTER — Ambulatory Visit: Payer: Medicare Other | Admitting: Family Medicine

## 2020-08-10 ENCOUNTER — Other Ambulatory Visit: Payer: Self-pay

## 2020-08-10 DIAGNOSIS — E1122 Type 2 diabetes mellitus with diabetic chronic kidney disease: Secondary | ICD-10-CM

## 2020-08-17 ENCOUNTER — Ambulatory Visit (INDEPENDENT_AMBULATORY_CARE_PROVIDER_SITE_OTHER): Payer: Medicare Other | Admitting: *Deleted

## 2020-08-17 VITALS — Ht 69.0 in | Wt 243.0 lb

## 2020-08-17 DIAGNOSIS — Z0000 Encounter for general adult medical examination without abnormal findings: Secondary | ICD-10-CM

## 2020-08-17 NOTE — Patient Instructions (Signed)
°  [place] Maintenance Summary and Written Plan of Care  Michael Trevino ,  Thank you for allowing me to perform your Medicare Annual Wellness Visit and for your ongoing commitment to your health.   Health Maintenance & Immunization History Health Maintenance  Topic Date Due   INFLUENZA VACCINE  06/13/2020   OPHTHALMOLOGY EXAM  10/12/2020 (Originally 06/29/2016)   Hepatitis C Screening  10/12/2020 (Originally 03-09-1941)   TETANUS/TDAP  09/09/2021 (Originally 02/08/1960)   HEMOGLOBIN A1C  01/10/2021   FOOT EXAM  01/18/2021   COVID-19 Vaccine  Completed   PNA vac Low Risk Adult  Completed   Immunization History  Administered Date(s) Administered   Influenza Whole 08/25/2008   Influenza, High Dose Seasonal PF 08/30/2016, 08/16/2017, 08/19/2018   Influenza,inj,Quad PF,6+ Mos 08/19/2014, 08/25/2015, 09/23/2019   Moderna SARS-COVID-2 Vaccination 01/08/2020, 02/06/2020   Pneumococcal Conjugate-13 04/06/2015   Pneumococcal Polysaccharide-23 10/06/2005, 12/12/2007    These are the patient goals that we discussed: Goals Addressed            This Visit's Progress    Exercise 150 min/wk Moderate Activity       08/17/2020 AWV Goal: Exercise for General Health   Patient will verbalize understanding of the benefits of increased physical activity:  Exercising regularly is important. It will improve your overall fitness, flexibility, and endurance.  Regular exercise also will improve your overall health. It can help you control your weight, reduce stress, and improve your bone density.  Over the next year, patient will increase physical activity as tolerated with a goal of at least 150 minutes of moderate physical activity per week.   You can tell that you are exercising at a moderate intensity if your heart starts beating faster and you start breathing faster but can still hold a conversation.  Moderate-intensity exercise ideas  include:  Walking 1 mile (1.6 km) in about 15 minutes  Biking  Hiking  Golfing  Dancing  Water aerobics  Patient will verbalize understanding of everyday activities that increase physical activity by providing examples like the following: ? Yard work, such as: ? Pushing a Conservation officer, nature ? Raking and bagging leaves ? Washing your car ? Pushing a stroller ? Shoveling snow ? Gardening ? Washing windows or floors  Patient will be able to explain general safety guidelines for exercising:   Before you start a new exercise program, talk with your health care provider.  Do not exercise so much that you hurt yourself, feel dizzy, or get very short of breath.  Wear comfortable clothes and wear shoes with good support.  Drink plenty of water while you exercise to prevent dehydration or heat stroke.  Work out until your breathing and your heartbeat get faster.         This is a list of Health Maintenance Items that are overdue or due now: Health Maintenance Due  Topic Date Due   INFLUENZA VACCINE  06/13/2020     Orders/Referrals Placed Today: No orders of the defined types were placed in this encounter.  (Contact our referral department at [phone] if you have not spoken with someone about your referral appointment within the next 5 days)    Follow-up Plan Follow up with Dr.Dettinger as scheduled

## 2020-08-17 NOTE — Progress Notes (Signed)
MEDICARE ANNUAL WELLNESS VISIT  08/17/2020  Telephone Visit Disclaimer This Medicare AWV was conducted by telephone due to national recommendations for restrictions regarding the COVID-19 Pandemic (e.g. social distancing).  I verified, using two identifiers, that I am speaking with Michael Trevino or their authorized healthcare agent. I discussed the limitations, risks, security, and privacy concerns of performing an evaluation and management service by telephone and the potential availability of an in-person appointment in the future. The patient expressed understanding and agreed to proceed.  Location of Patient: home  Location of Provider (nurse):  Office   Subjective:    Michael Trevino is a 79 y.o. male patient of Dettinger, Fransisca Kaufmann, MD who had a Medicare Annual Wellness Visit today via telephone. Lipa is Retired and lives with their spouse. he has 6 children. he reports that he is socially active and does interact with friends/family regularly. he is not physically active and enjoys fishing.  Patient Care Team: Dettinger, Fransisca Kaufmann, MD as PCP - General (Family Medicine) Melina Schools, OD as Consulting Physician (Optometry) Fleet Contras, MD as Consulting Physician (Nephrology) Cassandria Anger, MD as Consulting Physician (Endocrinology) Ilean China, RN as Registered Nurse  Advanced Directives 08/17/2020 11/30/2019 11/30/2019 09/24/2019 09/23/2019 06/24/2019 06/13/2018  Does Patient Have a Medical Advance Directive? No No No No No No No  Would patient like information on creating a medical advance directive? No - Patient declined No - Patient declined - No - Patient declined - Yes (MAU/Ambulatory/Procedural Areas - Information given) No - Patient declined  Pre-existing out of facility DNR order (yellow form or pink MOST form) - - - - - - -    Hospital Utilization Over the Past 12 Months: # of hospitalizations or ER visits: 2 # of surgeries: 0  Review of Systems      Patient reports that his overall health is unchanged compared to last year.  History obtained from chart review and the patient General ROS: negative  Patient Reported Readings (BP, Pulse, CBG, Weight, etc) none  Pain Assessment Pain : No/denies pain     Current Medications & Allergies (verified) Allergies as of 08/17/2020      Reactions   Zocor [simvastatin - High Dose] Nausea Only   Unknown      Medication List       Accurate as of August 17, 2020 11:34 AM. If you have any questions, ask your nurse or doctor.        amLODipine 10 MG tablet Commonly known as: NORVASC TAKE 1 TABLET BY MOUTH EVERY DAY   ascorbic acid 500 MG tablet Commonly known as: VITAMIN C Take 500 mg by mouth daily.   aspirin EC 81 MG tablet Take 81 mg by mouth daily.   atorvastatin 40 MG tablet Commonly known as: LIPITOR TAKE 1 TABLET BY MOUTH EVERY DAY   calcitRIOL 0.5 MCG capsule Commonly known as: ROCALTROL Take 0.5 mcg by mouth daily.   Calcium Carbonate 500 MG Chew Chew 1 tablet (500 mg total) by mouth 3 (three) times daily as needed. What changed: when to take this   furosemide 80 MG tablet Commonly known as: LASIX Take 1 tablet (80 mg total) by mouth 2 (two) times daily.   glucose blood test strip Use to check blood glucose once daily.  Dx:  E11.9   hydrALAZINE 10 MG tablet Commonly known as: APRESOLINE Take 1 tablet (10 mg total) by mouth 3 (three) times daily.   insulin detemir 100 UNIT/ML FlexPen  Commonly known as: Levemir FlexPen Inject 25 Units into the skin at bedtime.   levothyroxine 175 MCG tablet Commonly known as: SYNTHROID TAKE 1 TABLET (175 MCG TOTAL) BY MOUTH DAILY BEFORE BREAKFAST.   metoprolol tartrate 100 MG tablet Commonly known as: LOPRESSOR Take 1 tablet (100 mg total) by mouth 2 (two) times daily.   nitroGLYCERIN 0.4 MG SL tablet Commonly known as: NITROSTAT Place 1 tablet (0.4 mg total) under the tongue every 5 (five) minutes as needed for  chest pain.       History (reviewed): Past Medical History:  Diagnosis Date  . Arthritis   . Diabetes mellitus without complication ([place])   . Heart attack ([place])   . Hyperlipidemia   . Hypertension   . MI (myocardial infarction) ([place]) 2000  . Renal disorder   . Sepsis ([place])   . Sepsis due to Streptococcus, group B ([place]) 02/24/2013  . Stroke ([place])   . Thyroid disease    hypothyroid   Past Surgical History:  Procedure Laterality Date  . TEE WITHOUT CARDIOVERSION N/A 02/26/2013   Procedure: TRANSESOPHAGEAL ECHOCARDIOGRAM (TEE);  Surgeon: Thayer Headings, MD;  [facility];  Service: Cardiovascular;  Laterality: N/A;   Family History  Problem Relation Age of Onset  . Diabetes Mother   . Hypertension Mother   . Cancer Father   . Stroke Brother   . Alcohol abuse Brother   . Diabetes Brother   . Diabetes Sister   . Diabetes Brother 13       TYPE 1  . Kidney disease Brother 83       DIALYSIS  . Heart attack Brother   . Healthy Daughter   . Healthy Son   . Healthy Daughter   . Healthy Son   . Healthy Son   . Healthy Son    Social History   Socioeconomic History  . Marital status: Married    Spouse name: Not on file  . Number of children: 6  . Years of education: 74  . Highest education level: 9th grade  Occupational History  . 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 Smoking status: Former Smoker    Packs/day: 0.50    Years: 30.00    Pack years: 15.00    Types: Cigarettes    Quit date: 11/05/1984    Years since quitting: 35.8  . Smokeless tobacco: Never Used  Vaping Use  . Vaping Use: Never used  Substance and Sexual Activity  . Alcohol use: No  . Drug use: No  . Sexual activity: Yes  Other Topics Concern  . Not on file  Social History Narrative  . Not on file   Social Determinants of Health   Financial Resource Strain: Low Risk   . Difficulty  of Paying Living Expenses: Not hard at all  Food Insecurity: No Food Insecurity  . Worried About Charity fundraiser in the Last Year: Never true  . Ran Out of Food in the Last Year: Never true  Transportation Needs: No Transportation Needs  . Lack of Transportation (Medical): No  . Lack of Transportation (Non-Medical): No  Physical Activity: Inactive  . Days of Exercise per Week: 0 days  . Minutes of Exercise per Session: 0 min  Stress: No Stress Concern Present  . Feeling of Stress : Not at all  Social Connections: Moderately Integrated  . Frequency of Communication  with Friends and Family: Once a week  . Frequency of Social Gatherings with Friends and Family: More than three times a week  . Attends Religious Services: More than 4 times per year  . Active Member of Clubs or Organizations: No  . Attends Archivist Meetings: Never  . Marital Status: Married    Activities of Daily Living In your present state of health, do you have any difficulty performing the following activities: 08/17/2020 05/05/2020  Hearing? N N  Vision? N N  Difficulty concentrating or making decisions? N N  Walking or climbing stairs? Y N  Dressing or bathing? Y N  Doing errands, shopping? Y N  Preparing Food and eating ? N N  Using the Toilet? N N  In the past six months, have you accidently leaked urine? N N  Do you have problems with loss of bowel control? N N  Managing your Medications? Y N  Managing your Finances? N N  Housekeeping or managing your Housekeeping? Y N  Some recent data might be hidden    Patient Education/ Literacy How often do you need to have someone help you when you read instructions, pamphlets, or other written materials from your doctor or pharmacy?: 1 - Never What is the last grade level you completed in school?: 9th Grade  Exercise Current Exercise Habits: The patient does not participate in regular exercise at present  Diet Patient reports consuming 3 meals a  day and 2 snack(s) a day Patient reports that his primary diet is: Regular Patient reports that she does have regular access to food.   Depression Screen PHQ 2/9 Scores 08/17/2020 08/17/2020 07/12/2020 04/23/2020 01/19/2020 06/24/2019 04/11/2019  PHQ - 2 Score 0 0 0 0 0 0 0     Fall Risk Fall Risk  08/17/2020 07/12/2020 05/05/2020 04/23/2020 01/19/2020  Falls in the past year? 0 0 0 0 0  Number falls in past yr: 0 - 0 - -  Injury with Fall? 0 - 0 - -  Risk for fall due to : History of fall(s) - Impaired balance/gait - -  Follow up Falls evaluation completed - Falls evaluation completed;Falls prevention discussed - -     Objective:  Andri Prestia seemed alert and oriented and he participated appropriately during our telephone visit.  Blood Pressure Weight BMI  BP Readings from Last 3 Encounters:  07/12/20 (!) 168/56  06/23/20 (!) 196/68  04/23/20 (!) 166/56   Wt Readings from Last 3 Encounters:  08/17/20 243 lb (110.2 kg)  07/12/20 243 lb (110.2 kg)  06/23/20 242 lb 3.2 oz (109.9 kg)   BMI Readings from Last 1 Encounters:  08/17/20 35.88 kg/m    *Unable to obtain current vital signs, weight, and BMI due to telephone visit type  Hearing/Vision  . Adhrit did not seem to have difficulty with hearing/understanding during the telephone conversation . Reports that he has not had a formal eye exam by an eye care professional within the past year . Reports that he has not had a formal hearing evaluation within the past year *Unable to fully assess hearing and vision during telephone visit type  Cognitive Function: 6CIT Screen 08/17/2020 06/24/2019  What Year? 0 points 0 points  What month? 0 points 0 points  What time? 0 points 0 points  Count back from 20 0 points 0 points  Months in reverse 0 points 0 points  Repeat phrase 2 points 0 points  Total Score 2 0   (Normal:0-7, Significant for  Dysfunction: >8)  Normal Cognitive Function Screening: Yes   Immunization & Health Maintenance  Record Immunization History  Administered Date(s) Administered  . Influenza Whole 08/25/2008  . Influenza, High Dose Seasonal PF 08/30/2016, 08/16/2017, 08/19/2018  . Influenza,inj,Quad PF,6+ Mos 08/19/2014, 08/25/2015, 09/23/2019  . Moderna SARS-COVID-2 Vaccination 01/08/2020, 02/06/2020  . Pneumococcal Conjugate-13 04/06/2015  . Pneumococcal Polysaccharide-23 10/06/2005, 12/12/2007    Health Maintenance  Topic Date Due  . INFLUENZA VACCINE  06/13/2020  . OPHTHALMOLOGY EXAM  10/12/2020 (Originally 06/29/2016)  . Hepatitis C Screening  10/12/2020 (Originally 11/11/1941)  . TETANUS/TDAP  09/09/2021 (Originally 02[birth year]08/1960)  . HEMOGLOBIN A1C  01/10/2021  . FOOT EXAM  01/18/2021  . COVID-19 Vaccine  Completed  . PNA vac Low Risk Adult  Completed       Assessment  This is a routine wellness examination for [employer].  Health Maintenance: Due or Overdue Health Maintenance Due  Topic Date Due  . INFLUENZA VACCINE  06/13/2020    Michael Trevino does not need a referral for Community Assistance: Care Management:   no Social Work:    no Prescription Assistance:  no Nutrition/Diabetes Education:  no   Plan:  Personalized Goals Goals Addressed            This Visit's Progress   . Exercise 150 min/wk Moderate Activity       08/17/2020 AWV Goal: Exercise for General Health   Patient will verbalize understanding of the benefits of increased physical activity:  Exercising regularly is important. It will improve your overall fitness, flexibility, and endurance.  Regular exercise also will improve your overall health. It can help you control your weight, reduce stress, and improve your bone density.  Over the next year, patient will increase physical activity as tolerated with a goal of at least 150 minutes of moderate physical activity per week.   You can tell that you are exercising at a moderate intensity if your heart starts beating faster and you start breathing faster but  can still hold a conversation.  Moderate-intensity exercise ideas include:  Walking 1 mile (1.6 km) in about 15 minutes  Biking  Hiking  Golfing  Dancing  Water aerobics  Patient will verbalize understanding of everyday activities that increase physical activity by providing examples like the following: ? Yard work, such as: ? Pushing a Conservation officer, nature ? Raking and bagging leaves ? Washing your car ? Pushing a stroller ? Shoveling snow ? Gardening ? Washing windows or floors  Patient will be able to explain general safety guidelines for exercising:   Before you start a new exercise program, talk with your health care provider.  Do not exercise so much that you hurt yourself, feel dizzy, or get very short of breath.  Wear comfortable clothes and wear shoes with good support.  Drink plenty of water while you exercise to prevent dehydration or heat stroke.  Work out until your breathing and your heartbeat get faster.       Personalized Health Maintenance & Screening Recommendations  Influenza vaccine Td vaccine Diabetes screening Glaucoma screening  Lung Cancer Screening Recommended: no (Low Dose CT Chest recommended if Age 17-80 years, 30 pack-year currently smoking OR have quit w/in past 15 years) Hepatitis C Screening recommended: yes HIV Screening recommended: yes  Advanced Directives: Written information was not prepared per patient's request.  Referrals & Orders No orders of the defined types were placed in this encounter.   Follow-up Plan . Follow-up with Dettinger, Fransisca Kaufmann, MD as planned  I have personally reviewed and noted the following in the patient's chart:   . Medical and social history . Use of alcohol, tobacco or illicit drugs  . Current medications and supplements . Functional ability and status . Nutritional status . Physical activity . Advanced directives . List of other physicians . Hospitalizations, surgeries, and ER visits in  previous 12 months . Vitals . Screenings to include cognitive, depression, and falls . Referrals and appointments  In addition, I have reviewed and discussed with Michael Trevino certain preventive protocols, quality metrics, and best practice recommendations. A written personalized care plan for preventive services as well as general preventive health recommendations is available and can be mailed to the patient at his request.      Wardell Heath  08/17/2020  AVS Printed and mailed to patient

## 2020-09-03 ENCOUNTER — Other Ambulatory Visit: Payer: Self-pay

## 2020-09-03 ENCOUNTER — Other Ambulatory Visit: Payer: Self-pay | Admitting: Family Medicine

## 2020-09-03 DIAGNOSIS — N184 Chronic kidney disease, stage 4 (severe): Secondary | ICD-10-CM

## 2020-09-03 DIAGNOSIS — E1122 Type 2 diabetes mellitus with diabetic chronic kidney disease: Secondary | ICD-10-CM

## 2020-09-06 ENCOUNTER — Other Ambulatory Visit: Payer: Self-pay

## 2020-09-06 ENCOUNTER — Ambulatory Visit (HOSPITAL_COMMUNITY)
Admission: RE | Admit: 2020-09-06 | Discharge: 2020-09-06 | Disposition: A | Discharge status: Home / Self Care | Payer: Medicare Other | Source: Ambulatory Visit | Attending: Vascular Surgery | Admitting: Vascular Surgery

## 2020-09-06 ENCOUNTER — Encounter: Payer: Self-pay | Admitting: Vascular Surgery

## 2020-09-06 ENCOUNTER — Ambulatory Visit: Payer: Medicare Other | Admitting: Vascular Surgery

## 2020-09-06 VITALS — BP 193/77 | HR 71 | Temp 97.5°F | Resp 18 | Ht 69.0 in | Wt 235.0 lb

## 2020-09-06 DIAGNOSIS — E1122 Type 2 diabetes mellitus with diabetic chronic kidney disease: Secondary | ICD-10-CM | POA: Diagnosis not present

## 2020-09-06 DIAGNOSIS — N184 Chronic kidney disease, stage 4 (severe): Secondary | ICD-10-CM | POA: Insufficient documentation

## 2020-09-06 DIAGNOSIS — Z01818 Encounter for other preprocedural examination: Secondary | ICD-10-CM | POA: Diagnosis not present

## 2020-09-06 NOTE — Progress Notes (Signed)
[facility] [place]  Patient name: Michael Trevino MRN: [id number] DOB: [birth year] Sex: male  REASON FOR CONSULT: Discuss access for hemodialysis  HPI: Michael Trevino is a 79 y.o. male, here today for discussion of hemodialysis access.  He has had progressive renal insufficiency probably related to diabetes and hypertension.  He is now approaching need for hemodialysis and we have been consulted for access.  We have been requested to place an AV fistula now if possible and wait on AV graft if vein is not possible.  He is right-handed.  He has no history of pacemaker.  He is not on any anticoagulation.  He is here today with his wife.  He has no prior history of access.  Past Medical History:  Diagnosis Date  . Arthritis   . Diabetes mellitus without complication ([place])   . Heart attack ([place])   . Hyperlipidemia   . Hypertension   . MI (myocardial infarction) ([place]) 2000  . Renal disorder   . Sepsis ([place])   . Sepsis due to Streptococcus, group B ([place]) 02/24/2013  . Stroke ([place])   . Thyroid disease    hypothyroid    Family History  Problem Relation Age of Onset  . Diabetes Mother   . Hypertension Mother   . Cancer Father   . Stroke Brother   . Alcohol abuse Brother   . Diabetes Brother   . Diabetes Sister   . Diabetes Brother 13       TYPE 1  . Kidney disease Brother 61       DIALYSIS  . Heart attack Brother   . Healthy Daughter   . Healthy Son   . Healthy Daughter   . Healthy Son   . Healthy Son   . Healthy Son     SOCIAL HISTORY: Social History   Socioeconomic History  . Marital status: Married    Spouse name: Not on file  . Number of children: 6  . Years of education: 6  . Highest education level: 9th grade  Occupational History  . 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 Smoking status: Former Smoker    Packs/day: 0.50    Years: 30.00     Pack years: 15.00    Types: Cigarettes    Quit date: 11/05/1984    Years since quitting: 35.8  . Smokeless tobacco: Never Used  Vaping Use  . Vaping Use: Never used  Substance and Sexual Activity  . Alcohol use: No  . Drug use: No  . Sexual activity: Yes  Other Topics Concern  . Not on file  Social History Narrative  . Not on file   Social Determinants of Health   Financial Resource Strain: Low Risk   . Difficulty of Paying Living Expenses: Not hard at all  Food Insecurity: No Food Insecurity  . Worried About Charity fundraiser in the Last Year: Never true  . Ran Out of Food in the Last Year: Never true  Transportation Needs: No Transportation Needs  . Lack of Transportation (Medical): No  . Lack of Transportation (Non-Medical): No  Physical Activity: Inactive  . Days of Exercise per Week: 0 days  . Minutes of Exercise per Session: 0 min  Stress: No Stress Concern Present  . Feeling of Stress : Not at all  Social Connections: Moderately Integrated  . Frequency of  Communication with Friends and Family: Once a week  . Frequency of Social Gatherings with Friends and Family: More than three times a week  . Attends Religious Services: More than 4 times per year  . Active Member of Clubs or Organizations: No  . Attends Archivist Meetings: Never  . Marital Status: Married  Human resources officer Violence: Not At Risk  . Fear of Current or Ex-Partner: No  . Emotionally Abused: No  . Physically Abused: No  . Sexually Abused: No    Allergies  Allergen Reactions  . Zocor [Simvastatin - High Dose] Nausea Only    Unknown    Current Outpatient Medications  Medication Sig Dispense Refill  . amLODipine (NORVASC) 10 MG tablet TAKE 1 TABLET BY MOUTH EVERY DAY 90 tablet 1  . ascorbic acid (VITAMIN C) 500 MG tablet Take 500 mg by mouth daily.    Marland Kitchen aspirin EC 81 MG tablet Take 81 mg by mouth daily.    Marland Kitchen atorvastatin (LIPITOR) 40 MG tablet TAKE 1 TABLET BY MOUTH EVERY DAY 90  tablet 1  . calcitRIOL (ROCALTROL) 0.5 MCG capsule Take 0.5 mcg by mouth daily.    . Calcium Carbonate 500 MG CHEW Chew 1 tablet (500 mg total) by mouth 3 (three) times daily as needed. (Patient taking differently: Chew 500 mg by mouth daily. ) 30 each   . furosemide (LASIX) 80 MG tablet Take 1 tablet (80 mg total) by mouth 2 (two) times daily. 180 tablet 1  . glucose blood test strip Use to check blood glucose once daily.  Dx:  E11.9 100 each 3  . hydrALAZINE (APRESOLINE) 10 MG tablet Take 1 tablet (10 mg total) by mouth 3 (three) times daily. 90 tablet 3  . insulin detemir (LEVEMIR FLEXPEN) 100 UNIT/ML FlexPen Inject 25 Units into the skin at bedtime. 45 mL 2  . levothyroxine (SYNTHROID) 175 MCG tablet TAKE 1 TABLET (175 MCG TOTAL) BY MOUTH DAILY BEFORE BREAKFAST. 90 tablet 1  . metoprolol tartrate (LOPRESSOR) 100 MG tablet TAKE 1 TABLET BY MOUTH TWICE A DAY 180 tablet 3  . nitroGLYCERIN (NITROSTAT) 0.4 MG SL tablet Place 1 tablet (0.4 mg total) under the tongue every 5 (five) minutes as needed for chest pain. 50 tablet 3   No current facility-administered medications for this visit.    REVIEW OF SYSTEMS:  [X]  denotes positive finding, [ ]  denotes negative finding Cardiac  Comments:  Chest pain or chest pressure: x   Shortness of breath upon exertion: x   Short of breath when lying flat: x   Irregular heart rhythm: x       Vascular    Pain in calf, thigh, or hip brought on by ambulation:    Pain in feet at night that wakes you up from your sleep:     Blood clot in your veins:    Leg swelling:  x       Pulmonary    Oxygen at home:    Productive cough:     Wheezing:         Neurologic    Sudden weakness in arms or legs:  x   Sudden numbness in arms or legs:  x   Sudden onset of difficulty speaking or slurred speech:    Temporary loss of vision in one eye:     Problems with dizziness:         Gastrointestinal    Blood in stool:     Vomited blood:  Genitourinary      Burning when urinating:     Blood in urine:        Psychiatric    Major depression:         Hematologic    Bleeding problems:    Problems with blood clotting too easily:        Skin    Rashes or ulcers:        Constitutional    Fever or chills: x     PHYSICAL EXAM: Vitals:   09/06/20 1415  BP: (!) 193/77  Pulse: 71  Resp: 18  Temp: (!) 97.5 F (36.4 C)  TempSrc: Other (Comment)  SpO2: 94%  Weight: 235 lb (106.6 kg)  Height: 5\' 9"  (1.753 m)    GENERAL: The patient is a well-nourished male, in no acute distress. The vital signs are documented above. CARDIAC: There is a regular rate and rhythm.  VASCULAR: 2+ radial pulses bilaterally.  He has relatively small surface veins bilaterally.   PULMONARY: There is good air exchange bilaterally without wheezing or rales. ABDOMEN: Soft and non-tender with normal pitched bowel sounds.  MUSCULOSKELETAL: There are no major deformities or cyanosis. NEUROLOGIC: No focal weakness or paresthesias are detected. SKIN: There are no ulcers or rashes noted. PSYCHIATRIC: The patient has a normal affect.  DATA:   He underwent noninvasive upper extremity arterial and venous studies today at [facility].  I reviewed the studies and discussed them with the patient and his wife.  This shows very small cephalic veins bilaterally.  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  MEDICAL ISSUES:  I discussed options with the patient and his wife.  I discussed a temporary catheter for immediate hemodialysis which he currently does not need.  Also discussed AV fistula and AV graft and the advantages and disadvantages of each of these.  He does appear to be a very good candidate for left arm basilic vein transposition.  It appears as though his vein caliber is such that he could have a single-stage basilic vein transposition rather than  the two-stage.  I discussed potential nonmaturation and the eventual need for revisions.  We will schedule this at his earliest convenience   Skylar Priest [facility] phone [phone]

## 2020-09-10 ENCOUNTER — Other Ambulatory Visit: Payer: Self-pay

## 2020-09-10 DIAGNOSIS — E1122 Type 2 diabetes mellitus with diabetic chronic kidney disease: Secondary | ICD-10-CM

## 2020-09-17 DIAGNOSIS — N2581 Secondary hyperparathyroidism of renal origin: Secondary | ICD-10-CM | POA: Diagnosis not present

## 2020-09-17 DIAGNOSIS — E1122 Type 2 diabetes mellitus with diabetic chronic kidney disease: Secondary | ICD-10-CM | POA: Diagnosis not present

## 2020-09-17 DIAGNOSIS — N184 Chronic kidney disease, stage 4 (severe): Secondary | ICD-10-CM | POA: Diagnosis not present

## 2020-09-17 DIAGNOSIS — I120 Hypertensive chronic kidney disease with stage 5 chronic kidney disease or end stage renal disease: Secondary | ICD-10-CM | POA: Diagnosis not present

## 2020-09-17 DIAGNOSIS — N185 Chronic kidney disease, stage 5: Secondary | ICD-10-CM | POA: Diagnosis not present

## 2020-09-17 DIAGNOSIS — D631 Anemia in chronic kidney disease: Secondary | ICD-10-CM | POA: Diagnosis not present

## 2020-09-17 DIAGNOSIS — N189 Chronic kidney disease, unspecified: Secondary | ICD-10-CM | POA: Diagnosis not present

## 2020-09-22 ENCOUNTER — Ambulatory Visit: Payer: Medicare Other | Admitting: *Deleted

## 2020-09-22 DIAGNOSIS — E1122 Type 2 diabetes mellitus with diabetic chronic kidney disease: Secondary | ICD-10-CM

## 2020-09-22 NOTE — Chronic Care Management (AMB) (Signed)
  Chronic Care Management   Note  09/22/2020 Name: Marcoantonio Legault MRN: [id number] DOB: 1940-12-18  I reached out to Mrs Copeman by telephone today regarding her CCM. She mentioned that Mr Atienza is scheduled for a fistula placement on 09/30/20 and will begin using that for dialysis. She reports that they are doing well otherwise and do not have any questions, concerns, or CCM needs at this time.   Follow up plan: RN Care Manager will f/u with Mr Telford by telephone over the next 30 days.   [name], BSN, RN-BC Embedded Chronic Care Manager [facility] Direct Dial: [phone]

## 2020-09-22 NOTE — Patient Instructions (Signed)
Plan RN Care manager with f/u with patient over the next 30 days.  [name], BSN, RN-BC Embedded Chronic Care Manager [facility] Direct Dial: [phone]

## 2020-09-27 NOTE — Patient Instructions (Signed)
Michael Trevino  09/27/2020     @PREFPERIOPPHARMACY @   Your procedure is scheduled on  09/30/2020.  Report to [facility] at  [phone]  A.M.  Call this number if you have problems the morning of surgery:  [phone]   Remember:  Do not eat or drink after midnight.                         Take these medicines the morning of surgery with A SIP OF WATER  Amlodipine, levothyroxine, metoprolol. DO NOT take any medications for diabetes the morning of your surgery. Take 12.5 units of your levemir the night before your procedure.    Do not wear jewelry, make-up or nail polish.  Do not wear lotions, powders, or perfumes. Please wear deodorant and brush your teeth.  Do not shave 48 hours prior to surgery.  Men may shave face and neck.  Do not bring valuables to the hospital.  [facility] is not responsible for any belongings or valuables.  Contacts, dentures or bridgework may not be worn into surgery.  Leave your suitcase in the car.  After surgery it may be brought to your room.  For patients admitted to the hospital, discharge time will be determined by your treatment team.  Patients discharged the day of surgery will not be allowed to drive home.   Name and phone number of your driver:   family Special instructions:  DO NOT smoke the morning of your procedure.  Please read over the following fact sheets that you were given. Anesthesia Post-op Instructions and Care and Recovery After Surgery       AV Fistula Placement, Care After This sheet gives you information about how to care for yourself after your procedure. Your health care provider may also give you more specific instructions. If you have problems or questions, contact your health care provider. What can I expect after the procedure? After the procedure, it is common to:  Feel sore.  Feel a vibration (thrill) over the fistula. Follow these instructions at home: Incision care      Follow instructions from  your health care provider about how to take care of your incision. Make sure you: ? Wash your hands with soap and water before and after you change your bandage (dressing). If soap and water are not available, use hand sanitizer. ? Change your dressing as told by your health care provider. ? Leave stitches (sutures), skin glue, or adhesive strips in place. These skin closures may need to stay in place for 2 weeks or longer. If adhesive strip edges start to loosen and curl up, you may trim the loose edges. Do not remove adhesive strips completely unless your health care provider tells you to do that. Fistula care  Check your fistula site every day to make sure the thrill feels the same.  Check your fistula site every day for signs of infection. Check for: ? Redness, swelling, or pain. ? Fluid or blood. ? Warmth. ? Pus or a bad smell.  Raise (elevate) the affected area above the level of your heart while you are sitting or lying down.  Do not lift anything that is heavier than 10 lb (4.5 kg), or the limit that you are told, until your health care provider says that it is safe.  Do not lie down on your fistula arm.  Do not let anyone draw blood or take a blood  pressure reading on your fistula arm. This is important.  Do not wear tight jewelry or clothing over your fistula arm. Bathing  Do not take baths, swim, or use a hot tub until your health care provider approves. Ask your health care provider if you may take showers. You may only be allowed to take sponge baths.  Keep the area around your incision clean and dry. Medicines  Take over-the-counter and prescription medicines only as told by your health care provider.  Ask your health care provider if any medicine prescribed to you can cause constipation. You may need to take steps to prevent or treat constipation, such as: ? Drink enough fluid to keep your urine pale yellow. ? Take over-the-counter or prescription medicines. ? Eat  foods that are high in fiber, such as beans, whole grains, and fresh fruits and vegetables. ? Limit foods that are high in fat and processed sugars, such as fried or sweet foods. General instructions  Rest at home for a day or two.  Return to your normal activities as told by your health care provider. Ask your health care provider what activities are safe for you.  Keep all follow-up visits as told by your health care provider. This is important. Contact a health care provider if:  You have redness, swelling, or pain around your fistula site.  Your fistula site feels warm to the touch.  You have pus or a bad smell coming from your fistula site.  You have a fever.  You have numbness or coldness at your fistula site.  You feel a decrease or a change in the thrill. Get help right away if you:  Are bleeding from your fistula site.  Have chest pain.  Have trouble breathing. Summary  Follow instructions from your health care provider about how to take care of your incision.  Do not let anyone draw blood or take a blood pressure reading on your fistula arm. This is important.  Return to your normal activities as told by your health care provider. Ask your health care provider what activities are safe for you.  Contact a health care provider if you have a change in the thrill or have any signs of infection at your fistula site.  Keep all follow-up visits as told by your health care provider. This is important. This information is not intended to replace advice given to you by your health care provider. Make sure you discuss any questions you have with your health care provider. Document Revised: 04/18/2019 Document Reviewed: 05/06/2018 Elsevier Patient Education  2020 [place] After These instructions provide you with information about caring for yourself after your procedure. Your health care provider may also give you more specific  instructions. Your treatment has been planned according to current medical practices, but problems sometimes occur. Call your health care provider if you have any problems or questions after your procedure. What can I expect after the procedure? After your procedure, you may:  Feel sleepy for several hours.  Feel clumsy and have poor balance for several hours.  Feel forgetful about what happened after the procedure.  Have poor judgment for several hours.  Feel nauseous or vomit.  Have a sore throat if you had a breathing tube during the procedure. Follow these instructions at home: For at least 24 hours after the procedure:      Have a responsible adult stay with you. It is important to have someone help care for you until  you are awake and alert.  Rest as needed.  Do not: ? Participate in activities in which you could fall or become injured. ? Drive. ? Use heavy machinery. ? Drink alcohol. ? Take sleeping pills or medicines that cause drowsiness. ? Make important decisions or sign legal documents. ? Take care of children on your own. Eating and drinking  Follow the diet that is recommended by your health care provider.  If you vomit, drink water, juice, or soup when you can drink without vomiting.  Make sure you have little or no nausea before eating solid foods. General instructions  Take over-the-counter and prescription medicines only as told by your health care provider.  If you have sleep apnea, surgery and certain medicines can increase your risk for breathing problems. Follow instructions from your health care provider about wearing your sleep device: ? Anytime you are sleeping, including during daytime naps. ? While taking prescription pain medicines, sleeping medicines, or medicines that make you drowsy.  If you smoke, do not smoke without supervision.  Keep all follow-up visits as told by your health care provider. This is important. Contact a health care  provider if:  You keep feeling nauseous or you keep vomiting.  You feel light-headed.  You develop a rash.  You have a fever. Get help right away if:  You have trouble breathing. Summary  For several hours after your procedure, you may feel sleepy and have poor judgment.  Have a responsible adult stay with you for at least 24 hours or until you are awake and alert. This information is not intended to replace advice given to you by your health care provider. Make sure you discuss any questions you have with your health care provider. Document Revised: 01/28/2018 Document Reviewed: 02/20/2016 Elsevier Patient Education  [place]. How to Use Chlorhexidine for Bathing Chlorhexidine gluconate (CHG) is a germ-killing (antiseptic) solution that is used to clean the skin. It can get rid of the bacteria that normally live on the skin and can keep them away for about 24 hours. To clean your skin with CHG, you may be given:  A CHG solution to use in the shower or as part of a sponge bath.  A prepackaged cloth that contains CHG. Cleaning your skin with CHG may help lower the risk for infection:  While you are staying in the intensive care unit of the hospital.  If you have a vascular access, such as a central line, to provide short-term or long-term access to your veins.  If you have a catheter to drain urine from your bladder.  If you are on a ventilator. A ventilator is a machine that helps you breathe by moving air in and out of your lungs.  After surgery. What are the risks? Risks of using CHG include:  A skin reaction.  Hearing loss, if CHG gets in your ears.  Eye injury, if CHG gets in your eyes and is not rinsed out.  The CHG product catching fire. Make sure that you avoid smoking and flames after applying CHG to your skin. Do not use CHG:  If you have a chlorhexidine allergy or have previously reacted to chlorhexidine.  On babies younger than 2 months of  age. How to use CHG solution  Use CHG only as told by your health care provider, and follow the instructions on the label.  Use the full amount of CHG as directed. Usually, this is one bottle. During a shower Follow these steps when  using CHG solution during a shower (unless your health care provider gives you different instructions): 1. Start the shower. 2. Use your normal soap and shampoo to wash your face and hair. 3. Turn off the shower or move out of the shower stream. 4. Pour the CHG onto a clean washcloth. Do not use any type of brush or rough-edged sponge. 5. Starting at your neck, lather your body down to your toes. Make sure you follow these instructions: ? If you will be having surgery, pay special attention to the part of your body where you will be having surgery. Scrub this area for at least 1 minute. ? Do not use CHG on your head or face. If the solution gets into your ears or eyes, rinse them well with water. ? Avoid your genital area. ? Avoid any areas of skin that have broken skin, cuts, or scrapes. ? Scrub your back and under your arms. Make sure to wash skin folds. 6. Let the lather sit on your skin for 1-2 minutes or as long as told by your health care provider. 7. Thoroughly rinse your entire body in the shower. Make sure that all body creases and crevices are rinsed well. 8. Dry off with a clean towel. Do not put any substances on your body afterward--such as powder, lotion, or perfume--unless you are told to do so by your health care provider. Only use lotions that are recommended by the manufacturer. 9. Put on clean clothes or pajamas. 10. If it is the night before your surgery, sleep in clean sheets.  During a sponge bath Follow these steps when using CHG solution during a sponge bath (unless your health care provider gives you different instructions): 1. Use your normal soap and shampoo to wash your face and hair. 2. Pour the CHG onto a clean  washcloth. 3. Starting at your neck, lather your body down to your toes. Make sure you follow these instructions: ? If you will be having surgery, pay special attention to the part of your body where you will be having surgery. Scrub this area for at least 1 minute. ? Do not use CHG on your head or face. If the solution gets into your ears or eyes, rinse them well with water. ? Avoid your genital area. ? Avoid any areas of skin that have broken skin, cuts, or scrapes. ? Scrub your back and under your arms. Make sure to wash skin folds. 4. Let the lather sit on your skin for 1-2 minutes or as long as told by your health care provider. 5. Using a different clean, wet washcloth, thoroughly rinse your entire body. Make sure that all body creases and crevices are rinsed well. 6. Dry off with a clean towel. Do not put any substances on your body afterward--such as powder, lotion, or perfume--unless you are told to do so by your health care provider. Only use lotions that are recommended by the manufacturer. 7. Put on clean clothes or pajamas. 8. If it is the night before your surgery, sleep in clean sheets. How to use CHG prepackaged cloths  Only use CHG cloths as told by your health care provider, and follow the instructions on the label.  Use the CHG cloth on clean, dry skin.  Do not use the CHG cloth on your head or face unless your health care provider tells you to.  When washing with the CHG cloth: ? Avoid your genital area. ? Avoid any areas of skin that have broken skin,  cuts, or scrapes. Before surgery Follow these steps when using a CHG cloth to clean before surgery (unless your health care provider gives you different instructions): 1. Using the CHG cloth, vigorously scrub the part of your body where you will be having surgery. Scrub using a back-and-forth motion for 3 minutes. The area on your body should be completely wet with CHG when you are done scrubbing. 2. Do not rinse. Discard  the cloth and let the area air-dry. Do not put any substances on the area afterward, such as powder, lotion, or perfume. 3. Put on clean clothes or pajamas. 4. If it is the night before your surgery, sleep in clean sheets.  For general bathing Follow these steps when using CHG cloths for general bathing (unless your health care provider gives you different instructions). 1. Use a separate CHG cloth for each area of your body. Make sure you wash between any folds of skin and between your fingers and toes. Wash your body in the following order, switching to a new cloth after each step: ? The front of your neck, shoulders, and chest. ? Both of your arms, under your arms, and your hands. ? Your stomach and groin area, avoiding the genitals. ? Your right leg and foot. ? Your left leg and foot. ? The back of your neck, your back, and your buttocks. 2. Do not rinse. Discard the cloth and let the area air-dry. Do not put any substances on your body afterward--such as powder, lotion, or perfume--unless you are told to do so by your health care provider. Only use lotions that are recommended by the manufacturer. 3. Put on clean clothes or pajamas. Contact a health care provider if:  Your skin gets irritated after scrubbing.  You have questions about using your solution or cloth. Get help right away if:  Your eyes become very red or swollen.  Your eyes itch badly.  Your skin itches badly and is red or swollen.  Your hearing changes.  You have trouble seeing.  You have swelling or tingling in your mouth or throat.  You have trouble breathing.  You swallow any chlorhexidine. Summary  Chlorhexidine gluconate (CHG) is a germ-killing (antiseptic) solution that is used to clean the skin. Cleaning your skin with CHG may help to lower your risk for infection.  You may be given CHG to use for bathing. It may be in a bottle or in a prepackaged cloth to use on your skin. Carefully follow your  health care provider's instructions and the instructions on the product label.  Do not use CHG if you have a chlorhexidine allergy.  Contact your health care provider if your skin gets irritated after scrubbing. This information is not intended to replace advice given to you by your health care provider. Make sure you discuss any questions you have with your health care provider. Document Revised: 01/16/2019 Document Reviewed: 09/27/2017 Elsevier Patient Education  [place].

## 2020-09-28 ENCOUNTER — Other Ambulatory Visit (HOSPITAL_COMMUNITY)
Admission: RE | Admit: 2020-09-28 | Discharge: 2020-09-28 | Disposition: A | Discharge status: Home / Self Care | Payer: Medicare Other | Source: Ambulatory Visit | Attending: Vascular Surgery | Admitting: Vascular Surgery

## 2020-09-28 ENCOUNTER — Encounter (HOSPITAL_COMMUNITY): Payer: Self-pay

## 2020-09-28 ENCOUNTER — Other Ambulatory Visit: Payer: Self-pay

## 2020-09-28 ENCOUNTER — Encounter (HOSPITAL_COMMUNITY)
Admission: RE | Admit: 2020-09-28 | Discharge: 2020-09-28 | Disposition: A | Discharge status: Home / Self Care | Payer: Medicare Other | Source: Ambulatory Visit | Attending: Vascular Surgery | Admitting: Vascular Surgery

## 2020-09-28 DIAGNOSIS — Z20822 Contact with and (suspected) exposure to covid-19: Secondary | ICD-10-CM | POA: Insufficient documentation

## 2020-09-28 DIAGNOSIS — Z01812 Encounter for preprocedural laboratory examination: Secondary | ICD-10-CM | POA: Insufficient documentation

## 2020-09-28 LAB — CBC WITH DIFFERENTIAL/PLATELET
Abs Immature Granulocytes: 0.01 10*3/uL (ref 0.00–0.07)
Basophils Absolute: 0 10*3/uL (ref 0.0–0.1)
Basophils Relative: 0 %
Eosinophils Absolute: 0 10*3/uL (ref 0.0–0.5)
Eosinophils Relative: 1 %
HCT: 27.7 % — ABNORMAL LOW (ref 39.0–52.0)
Hemoglobin: 9 g/dL — ABNORMAL LOW (ref 13.0–17.0)
Immature Granulocytes: 0 %
Lymphocytes Relative: 38 %
Lymphs Abs: 1.9 10*3/uL (ref 0.7–4.0)
MCH: 28.8 pg (ref 26.0–34.0)
MCHC: 32.5 g/dL (ref 30.0–36.0)
MCV: 88.5 fL (ref 80.0–100.0)
Monocytes Absolute: 0.4 10*3/uL (ref 0.1–1.0)
Monocytes Relative: 7 %
Neutro Abs: 2.6 10*3/uL (ref 1.7–7.7)
Neutrophils Relative %: 54 %
Platelets: 189 10*3/uL (ref 150–400)
RBC: 3.13 MIL/uL — ABNORMAL LOW (ref 4.22–5.81)
RDW: 14.3 % (ref 11.5–15.5)
WBC: 4.9 10*3/uL (ref 4.0–10.5)
nRBC: 0 % (ref 0.0–0.2)

## 2020-09-28 LAB — BASIC METABOLIC PANEL
Anion gap: 11 (ref 5–15)
BUN: 62 mg/dL — ABNORMAL HIGH (ref 8–23)
CO2: 19 mmol/L — ABNORMAL LOW (ref 22–32)
Calcium: 8 mg/dL — ABNORMAL LOW (ref 8.9–10.3)
Chloride: 112 mmol/L — ABNORMAL HIGH (ref 98–111)
Creatinine, Ser: 5.79 mg/dL — ABNORMAL HIGH (ref 0.61–1.24)
GFR, Estimated: 9 mL/min — ABNORMAL LOW (ref >60)
Glucose, Bld: 108 mg/dL — ABNORMAL HIGH (ref 70–99)
Potassium: 4.4 mmol/L (ref 3.5–5.1)
Sodium: 142 mmol/L (ref 135–145)

## 2020-09-28 LAB — HEMOGLOBIN A1C
Hgb A1c MFr Bld: 6.7 % — ABNORMAL HIGH (ref 4.8–5.6)
Mean Plasma Glucose: 145.59 mg/dL

## 2020-09-29 ENCOUNTER — Encounter (HOSPITAL_COMMUNITY): Payer: Self-pay | Admitting: Anesthesiology

## 2020-09-29 LAB — SARS CORONAVIRUS 2 (TAT 6-24 HRS): SARS Coronavirus 2: NEGATIVE

## 2020-09-30 ENCOUNTER — Inpatient Hospital Stay (HOSPITAL_COMMUNITY): Payer: Medicare Other

## 2020-09-30 ENCOUNTER — Other Ambulatory Visit: Payer: Self-pay

## 2020-09-30 ENCOUNTER — Ambulatory Visit (HOSPITAL_COMMUNITY)
Admission: RE | Admit: 2020-09-30 | Discharge: 2020-09-30 | Disposition: A | Discharge status: Nursing Home (Includes ICF) | Payer: Medicare Other | Attending: Vascular Surgery | Admitting: Vascular Surgery

## 2020-09-30 ENCOUNTER — Inpatient Hospital Stay (HOSPITAL_COMMUNITY)
Admission: EM | Admit: 2020-09-30 | Discharge: 2020-10-13 | DRG: 264 | Disposition: A | Discharge status: Skilled Nursing Facility | Payer: Medicare Other | Attending: Internal Medicine | Admitting: Internal Medicine

## 2020-09-30 ENCOUNTER — Emergency Department (HOSPITAL_COMMUNITY): Payer: Medicare Other

## 2020-09-30 DIAGNOSIS — I361 Nonrheumatic tricuspid (valve) insufficiency: Secondary | ICD-10-CM

## 2020-09-30 DIAGNOSIS — J9601 Acute respiratory failure with hypoxia: Secondary | ICD-10-CM | POA: Diagnosis not present

## 2020-09-30 DIAGNOSIS — I10 Essential (primary) hypertension: Secondary | ICD-10-CM | POA: Diagnosis present

## 2020-09-30 DIAGNOSIS — I959 Hypotension, unspecified: Secondary | ICD-10-CM | POA: Diagnosis not present

## 2020-09-30 DIAGNOSIS — I350 Nonrheumatic aortic (valve) stenosis: Secondary | ICD-10-CM

## 2020-09-30 DIAGNOSIS — E782 Mixed hyperlipidemia: Secondary | ICD-10-CM | POA: Diagnosis not present

## 2020-09-30 DIAGNOSIS — N185 Chronic kidney disease, stage 5: Secondary | ICD-10-CM | POA: Diagnosis present

## 2020-09-30 DIAGNOSIS — E872 Acidosis: Secondary | ICD-10-CM | POA: Diagnosis not present

## 2020-09-30 DIAGNOSIS — I313 Pericardial effusion (noninflammatory): Secondary | ICD-10-CM | POA: Diagnosis present

## 2020-09-30 DIAGNOSIS — I132 Hypertensive heart and chronic kidney disease with heart failure and with stage 5 chronic kidney disease, or end stage renal disease: Secondary | ICD-10-CM | POA: Diagnosis not present

## 2020-09-30 DIAGNOSIS — I447 Left bundle-branch block, unspecified: Secondary | ICD-10-CM | POA: Diagnosis present

## 2020-09-30 DIAGNOSIS — D631 Anemia in chronic kidney disease: Secondary | ICD-10-CM | POA: Diagnosis not present

## 2020-09-30 DIAGNOSIS — Z22322 Carrier or suspected carrier of Methicillin resistant Staphylococcus aureus: Secondary | ICD-10-CM

## 2020-09-30 DIAGNOSIS — E1122 Type 2 diabetes mellitus with diabetic chronic kidney disease: Secondary | ICD-10-CM | POA: Diagnosis present

## 2020-09-30 DIAGNOSIS — Z992 Dependence on renal dialysis: Secondary | ICD-10-CM | POA: Diagnosis not present

## 2020-09-30 DIAGNOSIS — M6281 Muscle weakness (generalized): Secondary | ICD-10-CM | POA: Diagnosis not present

## 2020-09-30 DIAGNOSIS — I509 Heart failure, unspecified: Secondary | ICD-10-CM | POA: Diagnosis not present

## 2020-09-30 DIAGNOSIS — I214 Non-ST elevation (NSTEMI) myocardial infarction: Secondary | ICD-10-CM

## 2020-09-30 DIAGNOSIS — N186 End stage renal disease: Secondary | ICD-10-CM | POA: Diagnosis not present

## 2020-09-30 DIAGNOSIS — R0689 Other abnormalities of breathing: Secondary | ICD-10-CM

## 2020-09-30 DIAGNOSIS — I2582 Chronic total occlusion of coronary artery: Secondary | ICD-10-CM | POA: Diagnosis present

## 2020-09-30 DIAGNOSIS — I351 Nonrheumatic aortic (valve) insufficiency: Secondary | ICD-10-CM

## 2020-09-30 DIAGNOSIS — J948 Other specified pleural conditions: Secondary | ICD-10-CM | POA: Diagnosis not present

## 2020-09-30 DIAGNOSIS — R0902 Hypoxemia: Secondary | ICD-10-CM | POA: Diagnosis not present

## 2020-09-30 DIAGNOSIS — I252 Old myocardial infarction: Secondary | ICD-10-CM

## 2020-09-30 DIAGNOSIS — N184 Chronic kidney disease, stage 4 (severe): Secondary | ICD-10-CM | POA: Insufficient documentation

## 2020-09-30 DIAGNOSIS — Z7401 Bed confinement status: Secondary | ICD-10-CM | POA: Diagnosis not present

## 2020-09-30 DIAGNOSIS — Z888 Allergy status to other drugs, medicaments and biological substances status: Secondary | ICD-10-CM

## 2020-09-30 DIAGNOSIS — N2581 Secondary hyperparathyroidism of renal origin: Secondary | ICD-10-CM | POA: Diagnosis present

## 2020-09-30 DIAGNOSIS — Z794 Long term (current) use of insulin: Secondary | ICD-10-CM

## 2020-09-30 DIAGNOSIS — E785 Hyperlipidemia, unspecified: Secondary | ICD-10-CM | POA: Diagnosis not present

## 2020-09-30 DIAGNOSIS — Z550 Illiteracy and low-level literacy: Secondary | ICD-10-CM

## 2020-09-30 DIAGNOSIS — R5381 Other malaise: Secondary | ICD-10-CM | POA: Diagnosis present

## 2020-09-30 DIAGNOSIS — Z7982 Long term (current) use of aspirin: Secondary | ICD-10-CM

## 2020-09-30 DIAGNOSIS — I5023 Acute on chronic systolic (congestive) heart failure: Secondary | ICD-10-CM | POA: Diagnosis not present

## 2020-09-30 DIAGNOSIS — Z6831 Body mass index (BMI) 31.0-31.9, adult: Secondary | ICD-10-CM

## 2020-09-30 DIAGNOSIS — E1165 Type 2 diabetes mellitus with hyperglycemia: Secondary | ICD-10-CM | POA: Diagnosis present

## 2020-09-30 DIAGNOSIS — I5021 Acute systolic (congestive) heart failure: Secondary | ICD-10-CM

## 2020-09-30 DIAGNOSIS — E669 Obesity, unspecified: Secondary | ICD-10-CM | POA: Diagnosis present

## 2020-09-30 DIAGNOSIS — R079 Chest pain, unspecified: Secondary | ICD-10-CM | POA: Diagnosis not present

## 2020-09-30 DIAGNOSIS — Z5309 Procedure and treatment not carried out because of other contraindication: Secondary | ICD-10-CM | POA: Diagnosis not present

## 2020-09-30 DIAGNOSIS — Z9981 Dependence on supplemental oxygen: Secondary | ICD-10-CM | POA: Diagnosis not present

## 2020-09-30 DIAGNOSIS — R410 Disorientation, unspecified: Secondary | ICD-10-CM | POA: Diagnosis not present

## 2020-09-30 DIAGNOSIS — R778 Other specified abnormalities of plasma proteins: Secondary | ICD-10-CM | POA: Diagnosis present

## 2020-09-30 DIAGNOSIS — Z79899 Other long term (current) drug therapy: Secondary | ICD-10-CM

## 2020-09-30 DIAGNOSIS — I2510 Atherosclerotic heart disease of native coronary artery without angina pectoris: Secondary | ICD-10-CM | POA: Diagnosis not present

## 2020-09-30 DIAGNOSIS — I110 Hypertensive heart disease with heart failure: Secondary | ICD-10-CM | POA: Diagnosis not present

## 2020-09-30 DIAGNOSIS — I5033 Acute on chronic diastolic (congestive) heart failure: Secondary | ICD-10-CM | POA: Diagnosis not present

## 2020-09-30 DIAGNOSIS — E039 Hypothyroidism, unspecified: Secondary | ICD-10-CM | POA: Diagnosis present

## 2020-09-30 DIAGNOSIS — J90 Pleural effusion, not elsewhere classified: Secondary | ICD-10-CM | POA: Diagnosis present

## 2020-09-30 DIAGNOSIS — Z20822 Contact with and (suspected) exposure to covid-19: Secondary | ICD-10-CM | POA: Diagnosis present

## 2020-09-30 DIAGNOSIS — R2689 Other abnormalities of gait and mobility: Secondary | ICD-10-CM | POA: Diagnosis not present

## 2020-09-30 DIAGNOSIS — Z4901 Encounter for fitting and adjustment of extracorporeal dialysis catheter: Secondary | ICD-10-CM | POA: Diagnosis not present

## 2020-09-30 DIAGNOSIS — R0600 Dyspnea, unspecified: Secondary | ICD-10-CM

## 2020-09-30 DIAGNOSIS — M2550 Pain in unspecified joint: Secondary | ICD-10-CM | POA: Diagnosis not present

## 2020-09-30 DIAGNOSIS — Z419 Encounter for procedure for purposes other than remedying health state, unspecified: Secondary | ICD-10-CM

## 2020-09-30 DIAGNOSIS — Z95828 Presence of other vascular implants and grafts: Secondary | ICD-10-CM

## 2020-09-30 DIAGNOSIS — Z79890 Hormone replacement therapy (postmenopausal): Secondary | ICD-10-CM

## 2020-09-30 DIAGNOSIS — J9621 Acute and chronic respiratory failure with hypoxia: Secondary | ICD-10-CM

## 2020-09-30 DIAGNOSIS — Z87891 Personal history of nicotine dependence: Secondary | ICD-10-CM

## 2020-09-30 LAB — LACTATE DEHYDROGENASE, PLEURAL OR PERITONEAL FLUID: LD, Fluid: 79 U/L — ABNORMAL HIGH (ref 3–23)

## 2020-09-30 LAB — AMYLASE, PLEURAL OR PERITONEAL FLUID: Amylase, Fluid: 60 U/L

## 2020-09-30 LAB — BODY FLUID CELL COUNT WITH DIFFERENTIAL
Eos, Fluid: UNDETERMINED %
Lymphs, Fluid: UNDETERMINED %
Monocyte-Macrophage-Serous Fluid: UNDETERMINED % (ref 50–90)
Neutrophil Count, Fluid: UNDETERMINED % (ref 0–25)
Total Nucleated Cell Count, Fluid: UNDETERMINED cu mm (ref 0–1000)

## 2020-09-30 LAB — BRAIN NATRIURETIC PEPTIDE: B Natriuretic Peptide: 1022 pg/mL — ABNORMAL HIGH (ref 0.0–100.0)

## 2020-09-30 LAB — GRAM STAIN: Gram Stain: UNDETERMINED

## 2020-09-30 LAB — CBC WITH DIFFERENTIAL/PLATELET
Abs Immature Granulocytes: 0.02 10*3/uL (ref 0.00–0.07)
Basophils Absolute: 0 10*3/uL (ref 0.0–0.1)
Basophils Relative: 0 %
Eosinophils Absolute: 0.1 10*3/uL (ref 0.0–0.5)
Eosinophils Relative: 1 %
HCT: 29.8 % — ABNORMAL LOW (ref 39.0–52.0)
Hemoglobin: 9.6 g/dL — ABNORMAL LOW (ref 13.0–17.0)
Immature Granulocytes: 0 %
Lymphocytes Relative: 40 %
Lymphs Abs: 3.1 10*3/uL (ref 0.7–4.0)
MCH: 29.9 pg (ref 26.0–34.0)
MCHC: 32.2 g/dL (ref 30.0–36.0)
MCV: 92.8 fL (ref 80.0–100.0)
Monocytes Absolute: 0.4 10*3/uL (ref 0.1–1.0)
Monocytes Relative: 5 %
Neutro Abs: 4.1 10*3/uL (ref 1.7–7.7)
Neutrophils Relative %: 54 %
Platelets: 194 10*3/uL (ref 150–400)
RBC: 3.21 MIL/uL — ABNORMAL LOW (ref 4.22–5.81)
RDW: 14.3 % (ref 11.5–15.5)
WBC: 7.7 10*3/uL (ref 4.0–10.5)
nRBC: 0 % (ref 0.0–0.2)

## 2020-09-30 LAB — BLOOD GAS, ARTERIAL
Acid-base deficit: 4.8 mmol/L — ABNORMAL HIGH (ref 0.0–2.0)
Bicarbonate: 20.4 mmol/L (ref 20.0–28.0)
FIO2: 40
O2 Saturation: 94.3 %
Patient temperature: 37
pCO2 arterial: 35.8 mmHg (ref 32.0–48.0)
pH, Arterial: 7.358 (ref 7.350–7.450)
pO2, Arterial: 80.9 mmHg — ABNORMAL LOW (ref 83.0–108.0)

## 2020-09-30 LAB — COMPREHENSIVE METABOLIC PANEL
ALT: 16 U/L (ref 0–44)
AST: 15 U/L (ref 15–41)
Albumin: 3.4 g/dL — ABNORMAL LOW (ref 3.5–5.0)
Alkaline Phosphatase: 58 U/L (ref 38–126)
Anion gap: 11 (ref 5–15)
BUN: 66 mg/dL — ABNORMAL HIGH (ref 8–23)
CO2: 18 mmol/L — ABNORMAL LOW (ref 22–32)
Calcium: 8.2 mg/dL — ABNORMAL LOW (ref 8.9–10.3)
Chloride: 113 mmol/L — ABNORMAL HIGH (ref 98–111)
Creatinine, Ser: 5.56 mg/dL — ABNORMAL HIGH (ref 0.61–1.24)
GFR, Estimated: 10 mL/min — ABNORMAL LOW (ref >60)
Glucose, Bld: 141 mg/dL — ABNORMAL HIGH (ref 70–99)
Potassium: 4.4 mmol/L (ref 3.5–5.1)
Sodium: 142 mmol/L (ref 135–145)
Total Bilirubin: 0.5 mg/dL (ref 0.3–1.2)
Total Protein: 7.7 g/dL (ref 6.5–8.1)

## 2020-09-30 LAB — ECHOCARDIOGRAM COMPLETE
AR max vel: 1.29 cm2
AV Area VTI: 1.3 cm2
AV Area mean vel: 1.24 cm2
AV Mean grad: 19.4 mmHg
AV Peak grad: 34.7 mmHg
Ao pk vel: 2.95 m/s
Area-P 1/2: 3.23 cm2
Height: 69 in
P 1/2 time: 349 msec
S' Lateral: 2.37 cm
Weight: 3840 oz

## 2020-09-30 LAB — TROPONIN I (HIGH SENSITIVITY)
Troponin I (High Sensitivity): 79 ng/L — ABNORMAL HIGH (ref <18)
Troponin I (High Sensitivity): 194 ng/L (ref <18)

## 2020-09-30 LAB — GLUCOSE, PLEURAL OR PERITONEAL FLUID: Glucose, Fluid: 128 mg/dL

## 2020-09-30 LAB — PROTIME-INR
INR: 1 (ref 0.8–1.2)
Prothrombin Time: 12.9 seconds (ref 11.4–15.2)

## 2020-09-30 LAB — PROTEIN, PLEURAL OR PERITONEAL FLUID: Total protein, fluid: <3 g/dL

## 2020-09-30 LAB — ALBUMIN, PLEURAL OR PERITONEAL FLUID: Albumin, Fluid: 1.7 g/dL

## 2020-09-30 LAB — RESP PANEL BY RT-PCR (FLU A&B, COVID) ARPGX2
Influenza A by PCR: NEGATIVE
Influenza B by PCR: NEGATIVE
SARS Coronavirus 2 by RT PCR: NEGATIVE

## 2020-09-30 LAB — GLUCOSE, CAPILLARY: Glucose-Capillary: 157 mg/dL — ABNORMAL HIGH (ref 70–99)

## 2020-09-30 SURGERY — ARTERIOVENOUS (AV) FISTULA CREATION
Anesthesia: Choice | Laterality: Left

## 2020-09-30 MED ORDER — LEVOTHYROXINE SODIUM 75 MCG PO TABS
175.0000 ug | ORAL_TABLET | Freq: Every day | ORAL | Status: DC
  Administered 2020-10-01 – 2020-10-13 (×12): 175 ug via ORAL
  Filled 2020-09-30 (×12): qty 1

## 2020-09-30 MED ORDER — CEFAZOLIN SODIUM-DEXTROSE 2-4 GM/100ML-% IV SOLN: 2.0000 g | INTRAVENOUS | Status: DC

## 2020-09-30 MED ORDER — ASCORBIC ACID 500 MG PO TABS
500.0000 mg | ORAL_TABLET | Freq: Every day | ORAL | Status: DC
  Administered 2020-09-30 – 2020-10-13 (×13): 500 mg via ORAL
  Filled 2020-09-30 (×14): qty 1

## 2020-09-30 MED ORDER — CALCIUM CARBONATE 500 MG PO CHEW: 500.0000 mg | CHEWABLE_TABLET | Freq: Two times a day (BID) | ORAL | Status: DC

## 2020-09-30 MED ORDER — ONDANSETRON HCL 4 MG PO TABS: 4.0000 mg | ORAL_TABLET | Freq: Four times a day (QID) | ORAL | Status: DC | PRN

## 2020-09-30 MED ORDER — ORAL CARE MOUTH RINSE: 15.0000 mL | Freq: Once | OROMUCOSAL | Status: DC

## 2020-09-30 MED ORDER — INSULIN DETEMIR 100 UNIT/ML FLEXPEN: 25.0000 [IU] | PEN_INJECTOR | Freq: Every day | SUBCUTANEOUS | Status: DC

## 2020-09-30 MED ORDER — ACETAMINOPHEN 325 MG PO TABS
650.0000 mg | ORAL_TABLET | Freq: Four times a day (QID) | ORAL | Status: DC | PRN
  Administered 2020-10-03: 650 mg via ORAL
  Filled 2020-09-30: qty 2

## 2020-09-30 MED ORDER — CHLORHEXIDINE GLUCONATE 4 % EX LIQD: 60.0000 mL | Freq: Once | CUTANEOUS | Status: DC

## 2020-09-30 MED ORDER — ATORVASTATIN CALCIUM 40 MG PO TABS
40.0000 mg | ORAL_TABLET | Freq: Every day | ORAL | Status: DC
  Administered 2020-09-30 – 2020-10-01 (×2): 40 mg via ORAL
  Filled 2020-09-30 (×2): qty 1

## 2020-09-30 MED ORDER — SODIUM CHLORIDE 0.9% FLUSH: 3.0000 mL | INTRAVENOUS | Status: DC | PRN

## 2020-09-30 MED ORDER — ONDANSETRON HCL 4 MG/2ML IJ SOLN: 4.0000 mg | Freq: Four times a day (QID) | INTRAMUSCULAR | Status: DC | PRN

## 2020-09-30 MED ORDER — FUROSEMIDE 80 MG PO TABS: 80.0000 mg | ORAL_TABLET | Freq: Two times a day (BID) | ORAL | Status: DC

## 2020-09-30 MED ORDER — ALBUTEROL SULFATE HFA 108 (90 BASE) MCG/ACT IN AERS
INHALATION_SPRAY | RESPIRATORY_TRACT | Status: AC
  Filled 2020-09-30: qty 6.7

## 2020-09-30 MED ORDER — SODIUM CHLORIDE 0.9 % IV SOLN: 250.0000 mL | INTRAVENOUS | Status: DC | PRN

## 2020-09-30 MED ORDER — FUROSEMIDE 10 MG/ML IJ SOLN
120.0000 mg | Freq: Two times a day (BID) | INTRAVENOUS | Status: DC
  Administered 2020-09-30 – 2020-10-02 (×4): 120 mg via INTRAVENOUS
  Filled 2020-09-30: qty 10
  Filled 2020-09-30 (×6): qty 12
  Filled 2020-09-30: qty 10
  Filled 2020-09-30 (×3): qty 12

## 2020-09-30 MED ORDER — CALCIUM CARBONATE 1250 (500 CA) MG PO TABS
1.0000 | ORAL_TABLET | Freq: Two times a day (BID) | ORAL | Status: DC
  Administered 2020-09-30 – 2020-10-06 (×9): 500 mg via ORAL
  Filled 2020-09-30 (×14): qty 1

## 2020-09-30 MED ORDER — POLYETHYLENE GLYCOL 3350 17 G PO PACK: 17.0000 g | PACK | Freq: Every day | ORAL | Status: DC | PRN

## 2020-09-30 MED ORDER — SODIUM CHLORIDE 0.9 % IV SOLN: INTRAVENOUS | Status: DC

## 2020-09-30 MED ORDER — NITROGLYCERIN 0.4 MG SL SUBL
0.4000 mg | SUBLINGUAL_TABLET | SUBLINGUAL | Status: DC | PRN
  Administered 2020-09-30: 0.4 mg via SUBLINGUAL
  Filled 2020-09-30: qty 1

## 2020-09-30 MED ORDER — VITAMIN D 25 MCG (1000 UNIT) PO TABS
1000.0000 [IU] | ORAL_TABLET | Freq: Every day | ORAL | Status: DC
  Administered 2020-09-30 – 2020-10-13 (×13): 1000 [IU] via ORAL
  Filled 2020-09-30 (×13): qty 1

## 2020-09-30 MED ORDER — BISACODYL 10 MG RE SUPP
10.0000 mg | Freq: Every day | RECTAL | Status: DC | PRN
  Administered 2020-10-12: 10 mg via RECTAL
  Filled 2020-09-30: qty 1

## 2020-09-30 MED ORDER — FUROSEMIDE 10 MG/ML IJ SOLN
80.0000 mg | Freq: Once | INTRAMUSCULAR | Status: AC
  Administered 2020-09-30: 80 mg via INTRAVENOUS
  Filled 2020-09-30: qty 8

## 2020-09-30 MED ORDER — CHLORHEXIDINE GLUCONATE 0.12 % MT SOLN: 15.0000 mL | Freq: Once | OROMUCOSAL | Status: DC

## 2020-09-30 MED ORDER — HYDRALAZINE HCL 10 MG PO TABS: 10.0000 mg | ORAL_TABLET | Freq: Three times a day (TID) | ORAL | Status: DC

## 2020-09-30 MED ORDER — HEPARIN SODIUM (PORCINE) 5000 UNIT/ML IJ SOLN
5000.0000 [IU] | Freq: Three times a day (TID) | INTRAMUSCULAR | Status: DC
  Administered 2020-09-30 – 2020-10-01 (×2): 5000 [IU] via SUBCUTANEOUS
  Filled 2020-09-30 (×2): qty 1

## 2020-09-30 MED ORDER — HYDRALAZINE HCL 50 MG PO TABS
50.0000 mg | ORAL_TABLET | Freq: Three times a day (TID) | ORAL | Status: DC
  Administered 2020-09-30 – 2020-10-13 (×33): 50 mg via ORAL
  Filled 2020-09-30: qty 2
  Filled 2020-09-30 (×12): qty 1
  Filled 2020-09-30: qty 2
  Filled 2020-09-30 (×19): qty 1

## 2020-09-30 MED ORDER — CHLORHEXIDINE GLUCONATE CLOTH 2 % EX PADS
6.0000 | MEDICATED_PAD | Freq: Every day | CUTANEOUS | Status: DC
  Administered 2020-09-30 – 2020-10-10 (×7): 6 via TOPICAL

## 2020-09-30 MED ORDER — NITROGLYCERIN 0.4 MG SL SUBL: 0.4000 mg | SUBLINGUAL_TABLET | SUBLINGUAL | Status: DC | PRN

## 2020-09-30 MED ORDER — NITROGLYCERIN IN D5W 200-5 MCG/ML-% IV SOLN
5.0000 ug/min | INTRAVENOUS | Status: DC
  Administered 2020-09-30 (×2): 5 ug/min via INTRAVENOUS
  Administered 2020-10-01: 36.667 ug/min via INTRAVENOUS
  Administered 2020-10-01: 50 ug/min via INTRAVENOUS
  Administered 2020-10-02: 5 ug/min via INTRAVENOUS
  Filled 2020-09-30 (×2): qty 250

## 2020-09-30 MED ORDER — METOPROLOL TARTRATE 50 MG PO TABS
100.0000 mg | ORAL_TABLET | Freq: Two times a day (BID) | ORAL | Status: DC
  Administered 2020-09-30 – 2020-10-13 (×25): 100 mg via ORAL
  Filled 2020-09-30 (×26): qty 2

## 2020-09-30 MED ORDER — SODIUM CHLORIDE 0.9% FLUSH
3.0000 mL | Freq: Two times a day (BID) | INTRAVENOUS | Status: DC
  Administered 2020-09-30 – 2020-10-12 (×11): 3 mL via INTRAVENOUS

## 2020-09-30 MED ORDER — INSULIN ASPART 100 UNIT/ML ~~LOC~~ SOLN
0.0000 [IU] | Freq: Three times a day (TID) | SUBCUTANEOUS | Status: DC
  Administered 2020-10-01: 2 [IU] via SUBCUTANEOUS
  Administered 2020-10-03: 3 [IU] via SUBCUTANEOUS
  Administered 2020-10-04 – 2020-10-08 (×6): 1 [IU] via SUBCUTANEOUS
  Administered 2020-10-11: 2 [IU] via SUBCUTANEOUS
  Administered 2020-10-11: 1 [IU] via SUBCUTANEOUS

## 2020-09-30 MED ORDER — SODIUM CHLORIDE 0.9% FLUSH: 3.0000 mL | Freq: Two times a day (BID) | INTRAVENOUS | Status: DC

## 2020-09-30 MED ORDER — CALCITRIOL 0.25 MCG PO CAPS
0.5000 ug | ORAL_CAPSULE | Freq: Every day | ORAL | Status: DC
  Administered 2020-09-30 – 2020-10-13 (×13): 0.5 ug via ORAL
  Filled 2020-09-30 (×14): qty 2

## 2020-09-30 MED ORDER — AMLODIPINE BESYLATE 10 MG PO TABS
10.0000 mg | ORAL_TABLET | Freq: Every day | ORAL | Status: DC
  Administered 2020-10-01 – 2020-10-13 (×12): 10 mg via ORAL
  Filled 2020-09-30 (×3): qty 1
  Filled 2020-09-30 (×2): qty 2
  Filled 2020-09-30 (×8): qty 1

## 2020-09-30 MED ORDER — ACETAMINOPHEN 650 MG RE SUPP: 650.0000 mg | Freq: Four times a day (QID) | RECTAL | Status: DC | PRN

## 2020-09-30 MED ORDER — INSULIN DETEMIR 100 UNIT/ML ~~LOC~~ SOLN
25.0000 [IU] | Freq: Every day | SUBCUTANEOUS | Status: DC
  Administered 2020-09-30 – 2020-10-12 (×12): 25 [IU] via SUBCUTANEOUS
  Filled 2020-09-30 (×17): qty 0.25

## 2020-09-30 MED ORDER — ADULT MULTIVITAMIN W/MINERALS CH
1.0000 | ORAL_TABLET | Freq: Every day | ORAL | Status: DC
  Administered 2020-09-30 – 2020-10-06 (×6): 1 via ORAL
  Filled 2020-09-30 (×7): qty 1

## 2020-09-30 MED ORDER — TRAZODONE HCL 50 MG PO TABS
50.0000 mg | ORAL_TABLET | Freq: Every evening | ORAL | Status: DC | PRN
  Administered 2020-09-30 – 2020-10-11 (×6): 50 mg via ORAL
  Filled 2020-09-30 (×6): qty 1

## 2020-09-30 MED ORDER — ASPIRIN EC 81 MG PO TBEC
81.0000 mg | DELAYED_RELEASE_TABLET | Freq: Every day | ORAL | Status: DC
  Administered 2020-09-30 – 2020-10-03 (×4): 81 mg via ORAL
  Filled 2020-09-30 (×4): qty 1

## 2020-09-30 MED ORDER — INSULIN ASPART 100 UNIT/ML ~~LOC~~ SOLN
0.0000 [IU] | Freq: Every day | SUBCUTANEOUS | Status: DC
  Administered 2020-10-02 – 2020-10-05 (×3): 2 [IU] via SUBCUTANEOUS

## 2020-09-30 NOTE — H&P (Signed)
Patient Demographics:    Michael Trevino, is a 79 y.o. 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)   Pleural effusion--Lt > Rt    1)HFpEF--patient with acute on chronic diastolic dysfunction CHF exacerbation -echo from 09/30/2020 with EF of 55 to  60%, with severe LVH, small pericardial effusion without significant valvular abnormalities and no significant wall motion abnormalities ----Chest x-ray on admission with CHF and bilateral pleural effusion left more than right --Patient underwent successful ultrasound-guided thoracentesis on 09/30/2020 with removal of 1 L from the left pleural space--- studies pending -After thoracentesis patient was eventually able to come off BiPAP, and transitioned to nasal cannula --BNP is elevated at 1022 ,-Troponin is 194 in the setting of renal failure -Continue to dose high-dose IV Lasix per nephrology service -Patient will need to initiate hemodialysis to address his volume status  2) chronic anemia of CKD-- In ED hemoglobin is 9.6 which is not far from his recent baseline--no evidence of ongoing bleeding, -EPO/ESA agent per nephrology service  3)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 amlodipine 10 mg daily and hydralazine 50 mg 3 times daily -Use IV labetalol as needed persistent elevated BP once transitioned off IV nitro  5) acute hypoxic respiratory failure--- in the setting of volume overload in a patient with worsening CKD 5, as well as acute on chronic diastolic CHF exacerbation with bilateral left more than right pleural effusions--oxygenation improved after left-sided thoracentesis with removal of 1 L -Continue supplemental oxygen, patient has been able to come off BiPAP -Anticipate improvement in oxygenation with improvement in CHF and after initiation of hemodialysis to address volume status  6)CAD/HLD--continue Lipitor, aspirin, and metoprolol -troponin as above #1 -Currently chest pain-free  7)DM2-A1c 6.7 reflecting excellent diabetic control PTA -Use Novolog/Humalog Sliding scale insulin with Accu-Cheks/Fingersticks as ordered   8) hypothyroidism--continue levothyroxine 175 mcg daily  Disposition/Need for in-Hospital Stay- patient unable to be discharged at this time due to --- acute hypoxic respiratory failure secondary to diastolic CHF exacerbation and volume overload in setting of worsening CKD 5 requiring IV diuresis, BiPAP and respiratory support with supplemental oxygen as well as possible initiation of hemodialysis*  Status is: Inpatient  Remains inpatient appropriate because:acute hypoxic respiratory failure secondary to  diastolic CHF exacerbation and volume overload in setting of worsening CKD 5 requiring IV diuresis, BiPAP and respiratory support with supplemental oxygen as well as possible initiation of hemodialysis*   Dispo: The patient is from: Home              Anticipated d/c is to: Home              Anticipated d/c date is: 2 days              Patient currently is not medically stable to d/c. Barriers: Not Clinically Stable- acute hypoxic  respiratory failure secondary to diastolic CHF exacerbation and volume overload in setting of worsening CKD 5 requiring IV diuresis, BiPAP and respiratory support with supplemental oxygen as well as possible initiation of hemodialysis*    With History of - Reviewed by me  Past Medical History:  Diagnosis Date  . Arthritis   . Diabetes mellitus without complication ([place])   . Heart attack ([place])   . Hyperlipidemia   . Hypertension   . MI (myocardial infarction) ([place]) 2000  . Renal disorder   . Sepsis ([place])   . Sepsis due to Streptococcus, group B ([place]) 02/24/2013  . Stroke ([place])   . Thyroid disease    hypothyroid      Past Surgical History:  Procedure Laterality Date  . CARDIAC CATHETERIZATION  2000  . TEE WITHOUT CARDIOVERSION N/A 02/26/2013   Procedure: TRANSESOPHAGEAL ECHOCARDIOGRAM (TEE);  Surgeon: Michael Headings, MD;  Location: [facility];  Service: Cardiovascular;  Laterality: N/A;      Chief Complaint  Patient presents with  . Chest Pain      HPI:    Michael Trevino  is a 79 y.o. male with past medical history relevant for HTN, DM2, CKD 5 with nephrotic syndrome,, hypothyroidism, CAD with prior MI, and HLD and obesity who initially presented on 09/30/2020 to the surgical unit for creation of a left arm AV fistula by vascular surgeon Dr. Curt Trevino--- however patient complained of some chest discomfort and shortness of breath, so his procedure was canceled, and patient was sent to the ED for work-up for his shortness of breath -Patient was very short of breath and hypoxic required continuous BiPAP initially, also requiring IV nitro drip for elevated BP and CHF flareup -Patient received IV Lasix 80 mg x 2 with very limited output --Chest x-ray in the ED showed bilateral pleural effusion left more than right --Patient underwent successful ultrasound-guided thoracentesis on 09/30/2020 with removal of 1 L from the left pleural space--- studies pending -After thoracentesis  patient was eventually able to come off BiPAP, and transitioned to nasal cannula --- Echo from 37/62/8315 with diastolic dysfunction No fever  Or chills  -No productive cough No nausea, vomiting, diarrhea , denies pleuritic symptoms does have leg swelling In ED hemoglobin is 9.6 which is not far from his recent baseline, WBC 7.7 and platelets 194 --Creatinine is up to 5.5, with a bicarb of 18, potassium is 4.4 and BUN is 66 -LFTs are not elevated -BNP is elevated at 1022 and chest x-ray consistent with CHF with effusions -Troponin is 194 in the setting of renal failure   Review of systems:    In addition to the HPI above,   A full Review of  Systems was done, all other systems reviewed are negative except as noted above in HPI , .    Social History:  Reviewed by me    Social History   Tobacco Use  .  Smoking status: Former Smoker    Packs/day: 0.50    Years: 30.00    Pack years: 15.00    Types: Cigarettes    Quit date: 11/05/1984    Years since quitting: 35.9  . Smokeless tobacco: Never Used  Substance Use Topics  . Alcohol use: No       Family History :  Reviewed by me    Family History  Problem Relation Age of Onset  . Diabetes Mother   . Hypertension Mother   . Cancer Father   . Stroke Brother   . Alcohol abuse Brother   . Diabetes Brother   . Diabetes Sister   . Diabetes Brother 13       TYPE 1  . Kidney disease Brother 29       DIALYSIS  . Heart attack Brother   . Healthy Daughter   . Healthy Son   . Healthy Daughter   . Healthy Son   . Healthy Son   . Healthy Son      Home Medications:   Prior to Admission medications   Medication Sig Start Date End Date Taking? Authorizing Provider  amLODipine (NORVASC) 10 MG tablet TAKE 1 TABLET BY MOUTH EVERY DAY Patient taking differently: Take 10 mg by mouth daily.  06/14/20  Yes Dettinger, Fransisca Kaufmann, MD  ascorbic acid (VITAMIN C) 500 MG tablet Take 500 mg by mouth daily.   Yes [provider]   aspirin EC 81 MG tablet Take 81 mg by mouth daily.   Yes [provider]  atorvastatin (LIPITOR) 40 MG tablet TAKE 1 TABLET BY MOUTH EVERY DAY Patient taking differently: Take 40 mg by mouth daily.  04/22/20  Yes Dettinger, Fransisca Kaufmann, MD  calcitRIOL (ROCALTROL) 0.5 MCG capsule Take 0.5 mcg by mouth daily. 04/09/20  Yes [provider]  Calcium Carbonate 500 MG CHEW Chew 1 tablet (500 mg total) by mouth 3 (three) times daily as needed. Patient taking differently: Chew 500 mg by mouth daily.  05/01/16  Yes Cherre Robins, PharmD  cholecalciferol (VITAMIN D3) 25 MCG (1000 UNIT) tablet Take 1,000 Units by mouth daily.   Yes [provider]  furosemide (LASIX) 80 MG tablet Take 1 tablet (80 mg total) by mouth 2 (two) times daily. 05/20/20  Yes Dettinger, Fransisca Kaufmann, MD  glucose blood test strip Use to check blood glucose once daily.  Dx:  E11.9 07/14/15  Yes Wardell Honour, MD  hydrALAZINE (APRESOLINE) 10 MG tablet Take 1 tablet (10 mg total) by mouth 3 (three) times daily. 07/12/20  Yes Dettinger, Fransisca Kaufmann, MD  insulin detemir (LEVEMIR FLEXPEN) 100 UNIT/ML FlexPen Inject 25 Units into the skin at bedtime. 06/23/20  Yes Brita Romp, NP  levothyroxine (SYNTHROID) 175 MCG tablet TAKE 1 TABLET (175 MCG TOTAL) BY MOUTH DAILY BEFORE BREAKFAST. 04/19/20  Yes Nida, Marella Chimes, MD  metoprolol tartrate (LOPRESSOR) 100 MG tablet TAKE 1 TABLET BY MOUTH TWICE A DAY Patient taking differently: Take 100 mg by mouth 2 (two) times daily.  09/03/20  Yes Dettinger, Fransisca Kaufmann, MD  nitroGLYCERIN (NITROSTAT) 0.4 MG SL tablet Place 1 tablet (0.4 mg total) under the tongue every 5 (five) minutes as needed for chest pain. 07/12/20  Yes Dettinger, Fransisca Kaufmann, MD     Allergies:     Allergies  Allergen Reactions  . Zocor [Simvastatin - High Dose] Nausea Only    Unknown     Physical Exam:   Vitals  Blood pressure (!) 188/55, pulse  95, temperature (!) 96.9 F (36.1 C), temperature source  Axillary, resp. rate (!) 25, height 5\' 9"  (1.753 m), weight 107 kg, SpO2 94 %.  Physical Examination: General appearance - alert, some conversational dyspnea Nose-- [place]/Bipap Mask Mental status - alert, oriented to person, place, and time,  Eyes - sclera anicteric Neck - supple, no JVD elevation , Chest -diminished in bases especially on the left, no wheezing* Heart - S1 and S2 normal, regular  Abdomen - soft, nontender, nondistended, no masses or organomegaly Neurological - screening mental status exam normal, neck supple without rigidity, cranial nerves II through XII intact, DTR's normal and symmetric Extremities -2-3+ pitting pedal edema noted, intact peripheral pulses  Skin - warm, dry     Data Review:    CBC Recent Labs  Lab 09/28/20 1122 09/30/20 0654  WBC 4.9 7.7  HGB 9.0* 9.6*  HCT 27.7* 29.8*  PLT 189 194  MCV 88.5 92.8  MCH 28.8 29.9  MCHC 32.5 32.2  RDW 14.3 14.3  LYMPHSABS 1.9 3.1  MONOABS 0.4 0.4  EOSABS 0.0 0.1  BASOSABS 0.0 0.0   ------------------------------------------------------------------------------------------------------------------  Chemistries  Recent Labs  Lab 09/28/20 1122 09/30/20 0654  NA 142 142  K 4.4 4.4  CL 112* 113*  CO2 19* 18*  GLUCOSE 108* 141*  BUN 62* 66*  CREATININE 5.79* 5.56*  CALCIUM 8.0* 8.2*  AST  --  15  ALT  --  16  ALKPHOS  --  58  BILITOT  --  0.5   ------------------------------------------------------------------------------------------------------------------ estimated creatinine clearance is 13 mL/min (A) (by C-G formula based on SCr of 5.56 mg/dL (H)). ------------------------------------------------------------------------------------------------------------------ No results for input(s): TSH, T4TOTAL, T3FREE, THYROIDAB in the last 72 hours.  Invalid input(s): FREET3   Coagulation profile Recent Labs  Lab 09/30/20 0654  INR 1.0    ------------------------------------------------------------------------------------------------------------------- No results for input(s): DDIMER in the last 72 hours. -------------------------------------------------------------------------------------------------------------------  Cardiac Enzymes No results for input(s): CKMB, TROPONINI, MYOGLOBIN in the last 168 hours.  Invalid input(s): CK ------------------------------------------------------------------------------------------------------------------    Component Value Date/Time   BNP 1,022.0 (H) 09/30/2020 0654     ---------------------------------------------------------------------------------------------------------------  Urinalysis    Component Value Date/Time   COLORURINE YELLOW 11/30/2019 1147   APPEARANCEUR HAZY (A) 11/30/2019 1147   LABSPEC 1.010 11/30/2019 1147   PHURINE 5.0 11/30/2019 1147   GLUCOSEU >=500 (A) 11/30/2019 1147   HGBUR SMALL (A) 11/30/2019 1147   BILIRUBINUR NEGATIVE 11/30/2019 1147   [place] 11/30/2019 1147   PROTEINUR >=300 (A) 11/30/2019 1147   UROBILINOGEN 1.0 02/23/2013 1136   NITRITE NEGATIVE 11/30/2019 1147   LEUKOCYTESUR NEGATIVE 11/30/2019 1147    ----------------------------------------------------------------------------------------------------------------   Imaging Results:    DG Chest Portable 1 View  Result Date: 09/30/2020 CLINICAL DATA:  Status post thoracentesis. EXAM: PORTABLE CHEST 1 VIEW COMPARISON:  Earlier today at 6:49 a.m. FINDINGS: 11:19 a.m. Midline trachea. Mild cardiomegaly. Atherosclerosis in the transverse aorta. Decrease in small left pleural effusion. Probable tiny layering right pleural effusion. No pneumothorax. Mild congestive heart failure, improved. Persistent right and improved left base airspace disease. IMPRESSION: Decreased sided pleural effusion, without pneumothorax. Improved mild interstitial edema with left greater than right small  bilateral pleural effusions and adjacent Airspace disease, likely atelectasis. Aortic Atherosclerosis (ICD10-I70.0). Electronically Signed   By: Abigail Miyamoto M.D.   On: 09/30/2020 11:29   DG Chest Portable 1 View  Result Date: 09/30/2020 CLINICAL DATA:  Chest pain and shortness of breath EXAM: PORTABLE CHEST 1 VIEW COMPARISON:  11/30/2019 FINDINGS: Bilateral pleural effusion,  large appearing on the left. Interstitial and hazy airspace density on both sides with cephalized blood flow. No pneumothorax. Partially obscured heart. IMPRESSION: CHF pattern with large left and small right pleural effusion. Electronically Signed   By: Monte Fantasia M.D.   On: 09/30/2020 07:16   ECHOCARDIOGRAM COMPLETE  Result Date: 09/30/2020    ECHOCARDIOGRAM REPORT   Patient Name:   ELDON ZIETLOW Date of Exam: 09/30/2020 Medical Rec #:  [id number]    Height:       69.0 in Accession #:    [id number]   Weight:       240.0 lb Date of Birth:  1941/07/05    BSA:          2.232 m Patient Age:    89 years     BP:           167/58 mmHg Patient Gender: M            HR:           74 bpm. Exam Location:  [facility] Procedure: 2D Echo Indications:     Dyspnea 786.09 / R06.00  History:         Patient has prior history of Echocardiogram examinations, most                  recent 09/24/2019. CHF; Risk Factors:Former Smoker,                  Dyslipidemia, Hypertension and Diabetes. Nonrheumatic aortic                  valve insufficiency.  Sonographer:     Leavy Cella RDCS (AE) Referring Phys:  [id number] Arnoldo Lenis Diagnosing Phys: Carlyle Dolly MD IMPRESSIONS  1. Left ventricular ejection fraction, by estimation, is 55 to 60%. The left ventricle has normal function. The left ventricle has no regional wall motion abnormalities. There is severe left ventricular hypertrophy. Left ventricular diastolic parameters  are indeterminate. Elevated left atrial pressure.  2. Right ventricular systolic function is normal. The right ventricular  size is normal.  3. A small pericardial effusion is present. The pericardial effusion is circumferential. There is no evidence of cardiac tamponade. Large pleural effusion in the left lateral region.  4. The mitral valve is normal in structure. Trivial mitral valve regurgitation. No evidence of mitral stenosis.  5. The aortic valve was not well visualized. There is mild calcification of the aortic valve. There is mild thickening of the aortic valve. Aortic valve regurgitation is moderate. Moderate aortic valve stenosis.  6. The inferior vena cava is normal in size with greater than 50% respiratory variability, suggesting right atrial pressure of 3 mmHg. FINDINGS  Left Ventricle: Left ventricular ejection fraction, by estimation, is 55 to 60%. The left ventricle has normal function. The left ventricle has no regional wall motion abnormalities. The left ventricular internal cavity size was normal in size. There is  severe left ventricular hypertrophy. Left ventricular diastolic parameters are indeterminate. Elevated left atrial pressure. Right Ventricle: The right ventricular size is normal. No increase in right ventricular wall thickness. Right ventricular systolic function is normal. Left Atrium: Left atrial size was normal in size. Right Atrium: Right atrial size was normal in size. Pericardium: A small pericardial effusion is present. The pericardial effusion is circumferential. There is no evidence of cardiac tamponade. Mitral Valve: The mitral valve is normal in structure. There is mild thickening of the mitral valve leaflet(s). There is mild calcification of the  mitral valve leaflet(s). Mild mitral annular calcification. Trivial mitral valve regurgitation. No evidence  of mitral valve stenosis. Tricuspid Valve: The tricuspid valve is not well visualized. Tricuspid valve regurgitation is mild . No evidence of tricuspid stenosis. Aortic Valve: The aortic valve was not well visualized. There is mild calcification  of the aortic valve. There is mild thickening of the aortic valve. There is mild aortic valve annular calcification. Aortic valve regurgitation is moderate. Aortic regurgitation PHT measures 349 msec. Moderate aortic stenosis is present. Aortic valve mean gradient measures 19.4 mmHg. Aortic valve peak gradient measures 34.7 mmHg. Aortic valve area, by VTI measures 1.30 cm. Pulmonic Valve: The pulmonic valve was not well visualized. Pulmonic valve regurgitation is not visualized. No evidence of pulmonic stenosis. Aorta: The aortic root is normal in size and structure. Pulmonary Artery: Indeterminate PASP, inadequate TR jet. Venous: The inferior vena cava is normal in size with greater than 50% respiratory variability, suggesting right atrial pressure of 3 mmHg. IAS/Shunts: No atrial level shunt detected by color flow Doppler. Additional Comments: There is a large pleural effusion in the left lateral region.  LEFT VENTRICLE PLAX 2D LVIDd:         5.34 cm  Diastology LVIDs:         2.37 cm  LV e' lateral:   3.59 cm/s LV PW:         2.03 cm  LV E/e' lateral: 34.3 LV IVS:        1.69 cm LVOT diam:     2.10 cm LV SV:         87 LV SV Index:   39 LVOT Area:     3.46 cm  RIGHT VENTRICLE RV S prime:     14.20 cm/s TAPSE (M-mode): 2.2 cm LEFT ATRIUM             Index       RIGHT ATRIUM          Index LA diam:        3.40 cm 1.52 cm/m  RA Area:     8.96 cm LA Vol (A2C):   70.3 ml 31.49 ml/m RA Volume:   17.80 ml 7.97 ml/m LA Vol (A4C):   47.8 ml 21.41 ml/m LA Biplane Vol: 58.7 ml 26.29 ml/m  AORTIC VALVE AV Area (Vmax):    1.29 cm AV Area (Vmean):   1.24 cm AV Area (VTI):     1.30 cm AV Vmax:           294.66 cm/s AV Vmean:          207.077 cm/s AV VTI:            0.669 m AV Peak Grad:      34.7 mmHg AV Mean Grad:      19.4 mmHg LVOT Vmax:         109.33 cm/s LVOT Vmean:        73.863 cm/s LVOT VTI:          0.252 m LVOT/AV VTI ratio: 0.38 AI PHT:            349 msec  AORTA Ao Root diam: 3.10 cm MITRAL VALVE MV Area  (PHT): 3.23 cm     SHUNTS MV Decel Time: 235 msec     Systemic VTI:  0.25 m MV E velocity: 123.00 cm/s  Systemic Diam: 2.10 cm MV A velocity: 101.00 cm/s MV E/A ratio:  1.22 Carlyle Dolly MD Electronically signed by Carlyle Dolly MD  Signature Date/Time: 09/30/2020/12:04:11 PM    Final (Updated)    US THORACENTESIS ASP PLEURAL SPACE W/IMG GUIDE  Result Date: 09/30/2020 INDICATION: Left pleural effusion CHF EXAM: ULTRASOUND GUIDED LEFT THORACENTESIS MEDICATIONS: 10 cc 1% lidocaine. COMPLICATIONS: None immediate. PROCEDURE: An ultrasound guided thoracentesis was thoroughly discussed with the patient and questions answered. The benefits, risks, alternatives and complications were also discussed. The patient understands and wishes to proceed with the procedure. Written consent was obtained. Ultrasound was performed to localize and mark an adequate pocket of fluid in the left chest. The area was then prepped and draped in the normal sterile fashion. 1% Lidocaine was used for local anesthesia. Under ultrasound guidance a 19 G Yueh catheter was introduced. Thoracentesis was performed. The catheter was removed and a dressing applied. FINDINGS: A total of approximately 1 liter of blood tinged fluid was removed. Samples were sent to the laboratory as requested by the clinical team. IMPRESSION: Successful ultrasound guided left thoracentesis yielding 1 liter of pleural fluid. No PTX post procedure CXR Read by Lavonia Drafts [facility] Electronically Signed   By: Abigail Miyamoto M.D.   On: 09/30/2020 11:30    Radiological Exams on Admission: DG Chest Portable 1 View  Result Date: 09/30/2020 CLINICAL DATA:  Status post thoracentesis. EXAM: PORTABLE CHEST 1 VIEW COMPARISON:  Earlier today at 6:49 a.m. FINDINGS: 11:19 a.m. Midline trachea. Mild cardiomegaly. Atherosclerosis in the transverse aorta. Decrease in small left pleural effusion. Probable tiny layering right pleural effusion. No pneumothorax. Mild congestive heart  failure, improved. Persistent right and improved left base airspace disease. IMPRESSION: Decreased sided pleural effusion, without pneumothorax. Improved mild interstitial edema with left greater than right small bilateral pleural effusions and adjacent Airspace disease, likely atelectasis. Aortic Atherosclerosis (ICD10-I70.0). Electronically Signed   By: Abigail Miyamoto M.D.   On: 09/30/2020 11:29   DG Chest Portable 1 View  Result Date: 09/30/2020 CLINICAL DATA:  Chest pain and shortness of breath EXAM: PORTABLE CHEST 1 VIEW COMPARISON:  11/30/2019 FINDINGS: Bilateral pleural effusion, large appearing on the left. Interstitial and hazy airspace density on both sides with cephalized blood flow. No pneumothorax. Partially obscured heart. IMPRESSION: CHF pattern with large left and small right pleural effusion. Electronically Signed   By: Monte Fantasia M.D.   On: 09/30/2020 07:16   ECHOCARDIOGRAM COMPLETE  Result Date: 09/30/2020    ECHOCARDIOGRAM REPORT   Patient Name:   REMIGIO MCMILLON Date of Exam: 09/30/2020 Medical Rec #:  [id number]    Height:       69.0 in Accession #:    [id number]   Weight:       240.0 lb Date of Birth:  Oct 03, 1941    BSA:          2.232 m Patient Age:    24 years     BP:           167/58 mmHg Patient Gender: M            HR:           74 bpm. Exam Location:  [facility] Procedure: 2D Echo Indications:     Dyspnea 786.09 / R06.00  History:         Patient has prior history of Echocardiogram examinations, most                  recent 09/24/2019. CHF; Risk Factors:Former Smoker,                  Dyslipidemia, Hypertension and  Diabetes. Nonrheumatic aortic                  valve insufficiency.  Sonographer:     Leavy Cella RDCS (AE) Referring Phys:  [id number] Arnoldo Lenis Diagnosing Phys: Carlyle Dolly MD IMPRESSIONS  1. Left ventricular ejection fraction, by estimation, is 55 to 60%. The left ventricle has normal function. The left ventricle has no regional wall motion  abnormalities. There is severe left ventricular hypertrophy. Left ventricular diastolic parameters  are indeterminate. Elevated left atrial pressure.  2. Right ventricular systolic function is normal. The right ventricular size is normal.  3. A small pericardial effusion is present. The pericardial effusion is circumferential. There is no evidence of cardiac tamponade. Large pleural effusion in the left lateral region.  4. The mitral valve is normal in structure. Trivial mitral valve regurgitation. No evidence of mitral stenosis.  5. The aortic valve was not well visualized. There is mild calcification of the aortic valve. There is mild thickening of the aortic valve. Aortic valve regurgitation is moderate. Moderate aortic valve stenosis.  6. The inferior vena cava is normal in size with greater than 50% respiratory variability, suggesting right atrial pressure of 3 mmHg. FINDINGS  Left Ventricle: Left ventricular ejection fraction, by estimation, is 55 to 60%. The left ventricle has normal function. The left ventricle has no regional wall motion abnormalities. The left ventricular internal cavity size was normal in size. There is  severe left ventricular hypertrophy. Left ventricular diastolic parameters are indeterminate. Elevated left atrial pressure. Right Ventricle: The right ventricular size is normal. No increase in right ventricular wall thickness. Right ventricular systolic function is normal. Left Atrium: Left atrial size was normal in size. Right Atrium: Right atrial size was normal in size. Pericardium: A small pericardial effusion is present. The pericardial effusion is circumferential. There is no evidence of cardiac tamponade. Mitral Valve: The mitral valve is normal in structure. There is mild thickening of the mitral valve leaflet(s). There is mild calcification of the mitral valve leaflet(s). Mild mitral annular calcification. Trivial mitral valve regurgitation. No evidence  of mitral valve  stenosis. Tricuspid Valve: The tricuspid valve is not well visualized. Tricuspid valve regurgitation is mild . No evidence of tricuspid stenosis. Aortic Valve: The aortic valve was not well visualized. There is mild calcification of the aortic valve. There is mild thickening of the aortic valve. There is mild aortic valve annular calcification. Aortic valve regurgitation is moderate. Aortic regurgitation PHT measures 349 msec. Moderate aortic stenosis is present. Aortic valve mean gradient measures 19.4 mmHg. Aortic valve peak gradient measures 34.7 mmHg. Aortic valve area, by VTI measures 1.30 cm. Pulmonic Valve: The pulmonic valve was not well visualized. Pulmonic valve regurgitation is not visualized. No evidence of pulmonic stenosis. Aorta: The aortic root is normal in size and structure. Pulmonary Artery: Indeterminate PASP, inadequate TR jet. Venous: The inferior vena cava is normal in size with greater than 50% respiratory variability, suggesting right atrial pressure of 3 mmHg. IAS/Shunts: No atrial level shunt detected by color flow Doppler. Additional Comments: There is a large pleural effusion in the left lateral region.  LEFT VENTRICLE PLAX 2D LVIDd:         5.34 cm  Diastology LVIDs:         2.37 cm  LV e' lateral:   3.59 cm/s LV PW:         2.03 cm  LV E/e' lateral: 34.3 LV IVS:        1.69  cm LVOT diam:     2.10 cm LV SV:         87 LV SV Index:   39 LVOT Area:     3.46 cm  RIGHT VENTRICLE RV S prime:     14.20 cm/s TAPSE (M-mode): 2.2 cm LEFT ATRIUM             Index       RIGHT ATRIUM          Index LA diam:        3.40 cm 1.52 cm/m  RA Area:     8.96 cm LA Vol (A2C):   70.3 ml 31.49 ml/m RA Volume:   17.80 ml 7.97 ml/m LA Vol (A4C):   47.8 ml 21.41 ml/m LA Biplane Vol: 58.7 ml 26.29 ml/m  AORTIC VALVE AV Area (Vmax):    1.29 cm AV Area (Vmean):   1.24 cm AV Area (VTI):     1.30 cm AV Vmax:           294.66 cm/s AV Vmean:          207.077 cm/s AV VTI:            0.669 m AV Peak Grad:       34.7 mmHg AV Mean Grad:      19.4 mmHg LVOT Vmax:         109.33 cm/s LVOT Vmean:        73.863 cm/s LVOT VTI:          0.252 m LVOT/AV VTI ratio: 0.38 AI PHT:            349 msec  AORTA Ao Root diam: 3.10 cm MITRAL VALVE MV Area (PHT): 3.23 cm     SHUNTS MV Decel Time: 235 msec     Systemic VTI:  0.25 m MV E velocity: 123.00 cm/s  Systemic Diam: 2.10 cm MV A velocity: 101.00 cm/s MV E/A ratio:  1.22 Carlyle Dolly MD Electronically signed by Carlyle Dolly MD Signature Date/Time: 09/30/2020/12:04:11 PM    Final (Updated)    US THORACENTESIS ASP PLEURAL SPACE W/IMG GUIDE  Result Date: 09/30/2020 INDICATION: Left pleural effusion CHF EXAM: ULTRASOUND GUIDED LEFT THORACENTESIS MEDICATIONS: 10 cc 1% lidocaine. COMPLICATIONS: None immediate. PROCEDURE: An ultrasound guided thoracentesis was thoroughly discussed with the patient and questions answered. The benefits, risks, alternatives and complications were also discussed. The patient understands and wishes to proceed with the procedure. Written consent was obtained. Ultrasound was performed to localize and mark an adequate pocket of fluid in the left chest. The area was then prepped and draped in the normal sterile fashion. 1% Lidocaine was used for local anesthesia. Under ultrasound guidance a 19 G Yueh catheter was introduced. Thoracentesis was performed. The catheter was removed and a dressing applied. FINDINGS: A total of approximately 1 liter of blood tinged fluid was removed. Samples were sent to the laboratory as requested by the clinical team. IMPRESSION: Successful ultrasound guided left thoracentesis yielding 1 liter of pleural fluid. No PTX post procedure CXR Read by Lavonia Drafts [facility] Electronically Signed   By: Abigail Miyamoto M.D.   On: 09/30/2020 11:30   DVT Prophylaxis -SCD /heparin AM Labs Ordered, also please review Full Orders  Family Communication: Admission, patients condition and plan of care including tests being ordered have been  discussed with the patient  who indicate understanding and agree with the plan   Code Status - Full Code  Likely DC to  Home for further  diuresis and possible initiation of HD  Condition   stable  Roxan Hockey M.D on 09/30/2020 at 6:25 PM Go to [email or web address] -  for contact info  [facility] - Office  [phone]

## 2020-09-30 NOTE — Progress Notes (Signed)
*  PRELIMINARY RESULTS* Echocardiogram 2D Echocardiogram has been performed.  Leavy Cella 09/30/2020, 10:59 AM

## 2020-09-30 NOTE — Procedures (Addendum)
   Left US guided thoracentesis At bedside  1 Liter blood tinged fluid Pt tolerated well Labs per MD-- if any  CXR: No PTX  EBL: none

## 2020-09-30 NOTE — ED Provider Notes (Signed)
[facility] EMERGENCY DEPARTMENT Provider Note   CSN: [id number] Arrival date & time: 09/30/20  6213     History Chief Complaint  Patient presents with  . Chest Pain    Michael Trevino is a 79 y.o. male.  Multiple medical problems documented below who presents the emergency department today secondary to chest pain or shortness of breath.  His son offers the history as the patient is having difficulty breathing.  The son states that the patient was at the day hospital the preoperative area to get a fistula placed to start dialysis in the future.  He had acute onset of shortness of breath followed by some diaphoresis and some left-sided pressure.  Vital signs were taken and patient was hypoxic so sent him here for further evaluation.  Has a history of coronary artery disease and myocardial infarction.  No fever cough recently.  Was fine before this.  Has lower extremity swelling which is his baseline   Chest Pain      Past Medical History:  Diagnosis Date  . Arthritis   . Diabetes mellitus without complication ([place])   . Heart attack ([place])   . Hyperlipidemia   . Hypertension   . MI (myocardial infarction) ([place]) 2000  . Renal disorder   . Sepsis ([place])   . Sepsis due to Streptococcus, group B ([place]) 02/24/2013  . Stroke ([place])   . Thyroid disease    hypothyroid    Patient Active Problem List   Diagnosis Date Noted  . Cellulitis and abscess of lower extremity 11/30/2019  . CHF (congestive heart failure) ([place]) 09/30/2019  . Fluid overload 09/24/2019  . CKD stage 4 due to type 2 diabetes mellitus ([place]) 01/09/2018  . Essential hypertension, benign 09/11/2017  . Class 2 severe obesity due to excess calories with serious comorbidity and body mass index (BMI) of 37.0 to 37.9 in adult ([place]) 09/11/2017  . Nonrheumatic aortic valve insufficiency 05/03/2017  . Uncontrolled type 2 diabetes with stage 4 chronic kidney disease ([place]) 05/03/2017  . Arthritis   . Erectile dysfunction  04/11/2013  . Mixed hyperlipidemia 04/11/2013  . Hypothyroidism 02/08/2011  . Illiterate 02/08/2011  . CAD (coronary artery disease) 02/08/2011    Past Surgical History:  Procedure Laterality Date  . CARDIAC CATHETERIZATION  2000  . TEE WITHOUT CARDIOVERSION N/A 02/26/2013   Procedure: TRANSESOPHAGEAL ECHOCARDIOGRAM (TEE);  Surgeon: Thayer Headings, MD;  Location: [facility] ENDOSCOPY;  Service: Cardiovascular;  Laterality: N/A;       Family History  Problem Relation Age of Onset  . Diabetes Mother   . Hypertension Mother   . Cancer Father   . Stroke Brother   . Alcohol abuse Brother   . Diabetes Brother   . Diabetes Sister   . Diabetes Brother 13       TYPE 1  . Kidney disease Brother 93       DIALYSIS  . Heart attack Brother   . Healthy Daughter   . Healthy Son   . Healthy Daughter   . Healthy Son   . Healthy Son   . Healthy Son     Social History   Tobacco Use  . Smoking status: Former Smoker    Packs/day: 0.50    Years: 30.00    Pack years: 15.00    Types: Cigarettes    Quit date: 11/05/1984    Years since quitting: 35.9  . Smokeless tobacco: Never Used  Vaping Use  . Vaping Use: Never used  Substance Use Topics  .  Alcohol use: No  . Drug use: No    Home Medications Prior to Admission medications   Medication Sig Start Date End Date Taking? Authorizing Provider  amLODipine (NORVASC) 10 MG tablet TAKE 1 TABLET BY MOUTH EVERY DAY Patient taking differently: Take 10 mg by mouth daily.  06/14/20   Dettinger, Fransisca Kaufmann, MD  ascorbic acid (VITAMIN C) 500 MG tablet Take 500 mg by mouth daily.    [provider]  aspirin EC 81 MG tablet Take 81 mg by mouth daily.    [provider]  atorvastatin (LIPITOR) 40 MG tablet TAKE 1 TABLET BY MOUTH EVERY DAY Patient taking differently: Take 40 mg by mouth daily.  04/22/20   Dettinger, Fransisca Kaufmann, MD  calcitRIOL (ROCALTROL) 0.5 MCG capsule Take 0.5 mcg by mouth daily. 04/09/20   [provider]  Calcium  Carbonate 500 MG CHEW Chew 1 tablet (500 mg total) by mouth 3 (three) times daily as needed. Patient taking differently: Chew 500 mg by mouth daily.  05/01/16   Cherre Robins, PharmD  cholecalciferol (VITAMIN D3) 25 MCG (1000 UNIT) tablet Take 1,000 Units by mouth daily.    [provider]  furosemide (LASIX) 80 MG tablet Take 1 tablet (80 mg total) by mouth 2 (two) times daily. 05/20/20   Dettinger, Fransisca Kaufmann, MD  glucose blood test strip Use to check blood glucose once daily.  Dx:  E11.9 07/14/15   Wardell Honour, MD  hydrALAZINE (APRESOLINE) 10 MG tablet Take 1 tablet (10 mg total) by mouth 3 (three) times daily. 07/12/20   Dettinger, Fransisca Kaufmann, MD  insulin detemir (LEVEMIR FLEXPEN) 100 UNIT/ML FlexPen Inject 25 Units into the skin at bedtime. 06/23/20   Brita Romp, NP  levothyroxine (SYNTHROID) 175 MCG tablet TAKE 1 TABLET (175 MCG TOTAL) BY MOUTH DAILY BEFORE BREAKFAST. 04/19/20   Nida, Marella Chimes, MD  metoprolol tartrate (LOPRESSOR) 100 MG tablet TAKE 1 TABLET BY MOUTH TWICE A DAY Patient taking differently: Take 100 mg by mouth 2 (two) times daily.  09/03/20   Dettinger, Fransisca Kaufmann, MD  nitroGLYCERIN (NITROSTAT) 0.4 MG SL tablet Place 1 tablet (0.4 mg total) under the tongue every 5 (five) minutes as needed for chest pain. 07/12/20   Dettinger, Fransisca Kaufmann, MD    Allergies    Zocor [simvastatin - high dose]  Review of Systems   Review of Systems  Cardiovascular: Positive for chest pain.  All other systems reviewed and are negative.   Physical Exam Updated Vital Signs BP (!) 184/71   Pulse 84   Temp 97.7 F (36.5 C)   Resp (!) 31   Ht 5\' 9"  (1.753 m)   Wt 108.9 kg   SpO2 100%   BMI 35.44 kg/m   Physical Exam Vitals and nursing note reviewed.  Constitutional:      Appearance: He is well-developed.  HENT:     Head: Normocephalic and atraumatic.  Cardiovascular:     Rate and Rhythm: Normal rate.  Pulmonary:     Effort: Pulmonary effort is normal. No respiratory  distress.  Abdominal:     General: There is no distension.  Musculoskeletal:        General: Normal range of motion.     Cervical back: Normal range of motion.  Neurological:     Mental Status: He is alert.     ED Results / Procedures / Treatments   Labs (all labs ordered are listed, but only abnormal results are displayed) Labs Reviewed  CBC WITH DIFFERENTIAL/PLATELET - Abnormal; Notable for the following components:      Result Value   RBC 3.21 (*)    Hemoglobin 9.6 (*)    HCT 29.8 (*)    All other components within normal limits  COMPREHENSIVE METABOLIC PANEL - Abnormal; Notable for the following components:   Chloride 113 (*)    CO2 18 (*)    Glucose, Bld 141 (*)    BUN 66 (*)    Creatinine, Ser 5.56 (*)    Calcium 8.2 (*)    Albumin 3.4 (*)    GFR, Estimated 10 (*)    All other components within normal limits  TROPONIN I (HIGH SENSITIVITY) - Abnormal; Notable for the following components:   Troponin I (High Sensitivity) 79 (*)    All other components within normal limits  PROTIME-INR  BRAIN NATRIURETIC PEPTIDE    EKG EKG Interpretation  Date/Time:  Thursday September 30 2020 06:43:56 EST Ventricular Rate:  92 PR Interval:    QRS Duration: 124 QT Interval:  410 QTC Calculation: 508 R Axis:   -51 Text Interpretation: Normal sinus rhythm Left bundle branch block Baseline wander in lead(s) V1 V2 No significant change since last tracing Confirmed by Merrily Pew [id number]) on 09/30/2020 6:48:43 AM   Radiology DG Chest Portable 1 View  Result Date: 09/30/2020 CLINICAL DATA:  Chest pain and shortness of breath EXAM: PORTABLE CHEST 1 VIEW COMPARISON:  11/30/2019 FINDINGS: Bilateral pleural effusion, large appearing on the left. Interstitial and hazy airspace density on both sides with cephalized blood flow. No pneumothorax. Partially obscured heart. IMPRESSION: CHF pattern with large left and small right pleural effusion. Electronically Signed   By: Monte Fantasia  M.D.   On: 09/30/2020 07:16    Procedures .Critical Care Performed by: Merrily Pew, MD Authorized by: Merrily Pew, MD   Critical care provider statement:    Critical care time (minutes):  45   Critical care was necessary to treat or prevent imminent or life-threatening deterioration of the following conditions:  Respiratory failure   Critical care was time spent personally by me on the following activities:  Discussions with consultants, evaluation of patient's response to treatment, examination of patient, ordering and performing treatments and interventions, ordering and review of laboratory studies, ordering and review of radiographic studies, pulse oximetry, re-evaluation of patient's condition, obtaining history from patient or surrogate and review of old charts   (including critical care time)  Medications Ordered in ED Medications  nitroGLYCERIN (NITROSTAT) SL tablet 0.4 mg (0.4 mg Sublingual Given 09/30/20 0706)  nitroGLYCERIN 50 mg in dextrose 5 % 250 mL (0.2 mg/mL) infusion (5 mcg/min Intravenous New Bag/Given 09/30/20 0707)  furosemide (LASIX) injection 80 mg (80 mg Intravenous Given 09/30/20 6045)    ED Course  I have reviewed the triage vital signs and the nursing notes.  Pertinent labs & imaging results that were available during my care of the patient were reviewed by me and considered in my medical decision making (see chart for details).    MDM Rules/Calculators/A&P                          Overall patient's presentation is concerning for acute pulmonary edema.  BiPAP ordered.  Nitroglycerin ordered.  Lasix ordered.  Care transferred to oncoming physician pending completion of work-up and admission.  Final Clinical Impression(s) / ED Diagnoses Final diagnoses:  Acute systolic congestive heart failure (HCC)  Acute on chronic congestive heart failure, unspecified  heart failure type [facility])    Rx / DC Orders ED Discharge Orders    None       Berit Raczkowski, Corene Cornea,  MD 10/02/20 0131

## 2020-09-30 NOTE — ED Triage Notes (Signed)
Pt c/o chest pain while waiting in day surgery for procedure.

## 2020-09-30 NOTE — Progress Notes (Signed)
Patient restarted on Nitro gtt. Patient stated he was having chest pain and was uncomfortable laying in bed. Patient is sitting on the side of the bed.

## 2020-09-30 NOTE — Progress Notes (Signed)
**Note De-Identified  Obfuscation** Patient removed from BIPAP and placed on 3L [place], tolerating well at this time.  RRT to continue to monitor.

## 2020-09-30 NOTE — Consult Note (Signed)
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 Nephrologist: Michael Trevino ([facility]) -no real response to lasix 41m iv and having early uremic symptoms (dysgeusia). Would favor him starting hemodialysis while in-house. Recommend consulting surgery for tunneled dialysis catheter placement, will also need to coordinate with [facility] for access placement prior to d/c (will need to improve his respiratory status first) -consider foley placement for I/O -Continue to monitor daily Cr, Dose meds for GFR<15 -Monitor Daily I/Os, Daily weight  -Maintain MAP>65 for optimal renal perfusion.  - avoid further nephrotoxins including NSAIDS, Morphine.  Unless absolutely necessary, avoid CT with contrast and/or MRI with gadolinium.     Hypoxic respiratory distress with large left pleural effusion -consider thora -can do lasix 1219mIV BID -on tridil gtt -cardiology consulted  Hypertension: -on ntg gtt  Secondary hyperparathyroidism -continue home calcitriol. Restrict phos in diet. Monitor phos  Metabolic acidosis -start sodium bicarb 130073mID  Anemia due to chronic disease -Transfuse for Hgb<7 g/dL -is not on iron or ESAs yet, repeat iron panel  Diabetes Mellitus Type 2 with Hyperglycemia: mgmt per primary service  Michael Trevino 09/30/2020 8:38 AM   _____________________________________________________________________________________   History of Present Illness: Michael Trevino a/an 79 49o. male with a past medical history of CKD5 (secondary to DM/HTN), DM2, HTN, CAD, HTN, HLD, DM II, aortic insufficiency, and CAD who presents to [facility] with SOB and chest pain.  Patient was in the process of having his AV fistula placed however was found to be hypoxic  preoperatively.  He did have some left-sided pressure as well apparently his O2 sats were in the 60s.  In the ER he was found to have findings consistent with CHF along with a large left-sided pleural effusion.  He was given 70m75m Lasix.  Creatinine is 5.6.  Patient sees Dr. UptoHollie Salkher office.  He last saw her on 11/5.  Labs at that time showed a creatinine of 4.8 with a corresponding EGFR of 11. He does endorse new dysgeusia which has been happening for the last week or so along with worsening SOB.  Currently patient feels slightly better from a resp standpoint (now on BIPAP). Denies n/v/loss of appetite, intractable hiccups, brain fog, pruritis, fevers, chills, chest pain, dizziness.  Medications:  Current Facility-Administered Medications  Medication Dose Route Frequency Provider Last Rate Last Admin  . furosemide (LASIX) injection 80 mg  80 mg Intravenous Once ZammMilton Ferguson      . nitroGLYCERIN (NITROSTAT) SL tablet 0.4 mg  0.4 mg Sublingual Q5 min PRN Mesner, JasoCorene Cornea   0.4 mg at 09/30/20 0706  . nitroGLYCERIN 50 mg in dextrose 5 % 250 mL (0.2 mg/mL) infusion  5-200 mcg/min Intravenous Continuous Mesner, Jason, MD 1.5 mL/hr at 09/30/20 0707 5 mcg/min at 09/30/20 0707   Current Outpatient Medications  Medication Sig Dispense Refill  . amLODipine (NORVASC) 10 MG tablet TAKE 1 TABLET BY MOUTH EVERY DAY (Patient taking differently: Take 10 mg by mouth daily. ) 90 tablet 1  . ascorbic acid (VITAMIN C) 500 MG tablet Take 500 mg by mouth daily.    . asMarland Kitchenirin EC 81 MG tablet Take 81 mg by mouth daily.    . atMarland Kitchenrvastatin (LIPITOR) 40 MG tablet TAKE 1 TABLET BY MOUTH EVERY DAY (Patient taking differently: Take 40 mg by mouth daily. )  90 tablet 1  . calcitRIOL (ROCALTROL) 0.5 MCG capsule Take 0.5 mcg by mouth daily.    . Calcium Carbonate 500 MG CHEW Chew 1 tablet (500 mg total) by mouth 3 (three) times daily as needed. (Patient taking differently: Chew 500 mg by mouth daily. ) 30 each   .  cholecalciferol (VITAMIN D3) 25 MCG (1000 UNIT) tablet Take 1,000 Units by mouth daily.    . furosemide (LASIX) 80 MG tablet Take 1 tablet (80 mg total) by mouth 2 (two) times daily. 180 tablet 1  . glucose blood test strip Use to check blood glucose once daily.  Dx:  E11.9 100 each 3  . hydrALAZINE (APRESOLINE) 10 MG tablet Take 1 tablet (10 mg total) by mouth 3 (three) times daily. 90 tablet 3  . insulin detemir (LEVEMIR FLEXPEN) 100 UNIT/ML FlexPen Inject 25 Units into the skin at bedtime. 45 mL 2  . levothyroxine (SYNTHROID) 175 MCG tablet TAKE 1 TABLET (175 MCG TOTAL) BY MOUTH DAILY BEFORE BREAKFAST. 90 tablet 1  . metoprolol tartrate (LOPRESSOR) 100 MG tablet TAKE 1 TABLET BY MOUTH TWICE A DAY (Patient taking differently: Take 100 mg by mouth 2 (two) times daily. ) 180 tablet 3  . nitroGLYCERIN (NITROSTAT) 0.4 MG SL tablet Place 1 tablet (0.4 mg total) under the tongue every 5 (five) minutes as needed for chest pain. 50 tablet 3     ALLERGIES Zocor [simvastatin - high dose]  MEDICAL HISTORY Past Medical History:  Diagnosis Date  . Arthritis   . Diabetes mellitus without complication ([place])   . Heart attack ([place])   . Hyperlipidemia   . Hypertension   . MI (myocardial infarction) ([place]) 2000  . Renal disorder   . Sepsis ([place])   . Sepsis due to Streptococcus, group B ([place]) 02/24/2013  . Stroke ([place])   . Thyroid disease    hypothyroid     SOCIAL HISTORY Social History   Socioeconomic History  . Marital status: Married    Spouse name: Not on file  . Number of children: 6  . Years of education: 62  . Highest education level: 9th grade  Occupational History  . 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 Smoking status: Former Smoker    Packs/day: 0.50    Years: 30.00    Pack years: 15.00    Types: Cigarettes    Quit date: 11/05/1984    Years since quitting: 35.9  . Smokeless  tobacco: Never Used  Vaping Use  . Vaping Use: Never used  Substance and Sexual Activity  . Alcohol use: No  . Drug use: No  . Sexual activity: Yes  Other Topics Concern  . Not on file  Social History Narrative  . Not on file   Social Determinants of Health   Financial Resource Strain: Low Risk   . Difficulty of Paying Living Expenses: Not hard at all  Food Insecurity: No Food Insecurity  . Worried About Charity fundraiser in the Last Year: Never true  . Ran Out of Food in the Last Year: Never true  Transportation Needs: No Transportation Needs  . Lack of Transportation (Medical): No  . Lack of Transportation (Non-Medical): No  Physical Activity: Inactive  . Days of Exercise per Week: 0 days  . Minutes of Exercise per Session: 0 min  Stress: No Stress Concern Present  . Feeling of Stress :  Not at all  Social Connections: Moderately Integrated  . Frequency of Communication with Friends and Family: Once a week  . Frequency of Social Gatherings with Friends and Family: More than three times a week  . Attends Religious Services: More than 4 times per year  . Active Member of Clubs or Organizations: No  . Attends Archivist Meetings: Never  . Marital Status: Married  Human resources officer Violence: Not At Risk  . Fear of Current or Ex-Partner: No  . Emotionally Abused: No  . Physically Abused: No  . Sexually Abused: No     FAMILY HISTORY Family History  Problem Relation Age of Onset  . Diabetes Mother   . Hypertension Mother   . Cancer Father   . Stroke Brother   . Alcohol abuse Brother   . Diabetes Brother   . Diabetes Sister   . Diabetes Brother 13       TYPE 1  . Kidney disease Brother 58       DIALYSIS  . Heart attack Brother   . Healthy Daughter   . Healthy Son   . Healthy Daughter   . Healthy Son   . Healthy Son   . Healthy Son      Review of Systems: 12 systems reviewed Otherwise as per HPI, all other systems reviewed and negative  Physical  Exam: Vitals:   09/30/20 0730 09/30/20 0809  BP: (!) 181/71 (!) 177/60  Pulse: 82 81  Resp: (!) 29 (!) 25  Temp:    SpO2: 90% 99%   Total I/O In: -  Out: 150 [Urine:150]  Intake/Output Summary (Last 24 hours) at 09/30/2020 3500 Last data filed at 09/30/2020 0750 Gross per 24 hour  Intake --  Output 150 ml  Net -150 ml   General: well-appearing, no acute distress, sitting up in bed HEENT: anicteric sclera, oropharynx clear without lesions CV: regular rate, normal rhythm, no murmurs, no gallops, no rubs Lungs: diminished air entry left >right base, fine rales heard on right base, on bipap, bilateral chest expansion, unlabored Abd: obese, soft, non-tender, non-distended Skin: no visible lesions or rashes Musculoskeletal: trace edema bilateral LE's Neuro: normal speech, no gross focal deficits, no asterixis  Test Results Reviewed Lab Results  Component Value Date   NA 142 09/30/2020   K 4.4 09/30/2020   CL 113 (H) 09/30/2020   CO2 18 (L) 09/30/2020   BUN 66 (H) 09/30/2020   CREATININE 5.56 (H) 09/30/2020   CALCIUM 8.2 (L) 09/30/2020   ALBUMIN 3.4 (L) 09/30/2020   PHOS 4.0 12/02/2019     I have reviewed all relevant outside healthcare records related to the patient's kidney injury.

## 2020-09-30 NOTE — ED Provider Notes (Signed)
Patient in congestive heart failure and renal failure.  He was placed on BiPAP at 40% oxygen and has been tolerating this well.  I have spoke to renal who are going to consult on the patient.  I also spoke to him cardiology who will also consult on the patient and internal medicine will admit the patient  CRITICAL CARE Performed by: Milton Ferguson Total critical care time: 35 minutes Critical care time was exclusive of separately billable procedures and treating other patients. Critical care was necessary to treat or prevent imminent or life-threatening deterioration. Critical care was time spent personally by me on the following activities: development of treatment plan with patient and/or surrogate as well as nursing, discussions with consultants, evaluation of patient's response to treatment, examination of patient, obtaining history from patient or surrogate, ordering and performing treatments and interventions, ordering and review of laboratory studies, ordering and review of radiographic studies, pulse oximetry and re-evaluation of patient's condition.    Milton Ferguson, MD 09/30/20 [phone]

## 2020-09-30 NOTE — ED Notes (Addendum)
CRITICAL VALUE ALERT  Critical Value: Troponin 194  Date & Time Notied:  10/30/20 0950  Provider Notified: Dr. Roderic Palau   Orders Received/Actions taken: Admission

## 2020-09-30 NOTE — Sedation Documentation (Signed)
PT tolerated left sided thoracentesis procedure well with 1 Liter of red/cloudy fluid removed. No labs were ordered at this time but sent message to hospitalist to see if any would be added. Fluid collected and left with primary ED nurse if labs are added later. Vital signs stable at completion of procedure today and pt verbalized understanding of post procedure instructions. Portable chest xray ordered post-thora.

## 2020-09-30 NOTE — Progress Notes (Signed)
Here checking in at registration for surgery. C/O chest pain to Pam-secretary. Was notified per Pam of pt complaints. Instructed Pam to inform pt to go to ER. Pt transported via w/c to ER per son.

## 2020-09-30 NOTE — Progress Notes (Signed)
Echo with normal LVEF, signs of diastolic dysfunction. He essentially presents with acute on chronic diastolic HF in setting of advanced CKD. Fairly mild troponin thus far, continue to follow trend. EKG chronic incomplete LBBB. Diastolic HF may have been exacerbated by significant HTN and worsening renal function. We defer diuretic dosing to nephrology, limited uop with IV lasix 80mg , from nephrology notes plans to start HD in house. We will follow up troponin trend tomorrow AM peripherally, currently do not see indication for additional cardiac testing or intervention based on current presentation and data.    Carlyle Dolly MD

## 2020-09-30 NOTE — Progress Notes (Signed)
Dr Early notified of pt status. Surgery canceled.

## 2020-09-30 NOTE — Progress Notes (Signed)
Patient no longer complaining of chest pain. BP is 188/46 with a MAP of 99. Patient is showing no signs of distress and is resting comfortably in bed. Will continue to monitor for any change.

## 2020-09-30 NOTE — Progress Notes (Signed)
Patient transported from ED to ICU without any complications. 

## 2020-10-01 ENCOUNTER — Inpatient Hospital Stay (HOSPITAL_COMMUNITY): Payer: Medicare Other

## 2020-10-01 DIAGNOSIS — E785 Hyperlipidemia, unspecified: Secondary | ICD-10-CM

## 2020-10-01 DIAGNOSIS — E1122 Type 2 diabetes mellitus with diabetic chronic kidney disease: Secondary | ICD-10-CM | POA: Diagnosis not present

## 2020-10-01 DIAGNOSIS — I2510 Atherosclerotic heart disease of native coronary artery without angina pectoris: Secondary | ICD-10-CM | POA: Diagnosis not present

## 2020-10-01 DIAGNOSIS — I5033 Acute on chronic diastolic (congestive) heart failure: Secondary | ICD-10-CM | POA: Diagnosis not present

## 2020-10-01 DIAGNOSIS — I351 Nonrheumatic aortic (valve) insufficiency: Secondary | ICD-10-CM | POA: Diagnosis not present

## 2020-10-01 DIAGNOSIS — I214 Non-ST elevation (NSTEMI) myocardial infarction: Secondary | ICD-10-CM | POA: Diagnosis not present

## 2020-10-01 DIAGNOSIS — I10 Essential (primary) hypertension: Secondary | ICD-10-CM | POA: Diagnosis not present

## 2020-10-01 DIAGNOSIS — E039 Hypothyroidism, unspecified: Secondary | ICD-10-CM

## 2020-10-01 DIAGNOSIS — J9601 Acute respiratory failure with hypoxia: Secondary | ICD-10-CM | POA: Diagnosis not present

## 2020-10-01 HISTORY — PX: IR FLUORO GUIDE CV LINE RIGHT: IMG2283

## 2020-10-01 HISTORY — PX: IR US GUIDE VASC ACCESS RIGHT: IMG2390

## 2020-10-01 LAB — CBC
HCT: 26.4 % — ABNORMAL LOW (ref 39.0–52.0)
HCT: 25.4 % — ABNORMAL LOW (ref 39.0–52.0)
Hemoglobin: 8.5 g/dL — ABNORMAL LOW (ref 13.0–17.0)
Hemoglobin: 8.1 g/dL — ABNORMAL LOW (ref 13.0–17.0)
MCH: 29.7 pg (ref 26.0–34.0)
MCH: 29.5 pg (ref 26.0–34.0)
MCHC: 32.2 g/dL (ref 30.0–36.0)
MCHC: 31.9 g/dL (ref 30.0–36.0)
MCV: 92.3 fL (ref 80.0–100.0)
MCV: 92.4 fL (ref 80.0–100.0)
Platelets: 169 10*3/uL (ref 150–400)
Platelets: 162 10*3/uL (ref 150–400)
RBC: 2.86 MIL/uL — ABNORMAL LOW (ref 4.22–5.81)
RBC: 2.75 MIL/uL — ABNORMAL LOW (ref 4.22–5.81)
RDW: 14.2 % (ref 11.5–15.5)
RDW: 14.3 % (ref 11.5–15.5)
WBC: 4.9 10*3/uL (ref 4.0–10.5)
WBC: 7.1 10*3/uL (ref 4.0–10.5)
nRBC: 0 % (ref 0.0–0.2)
nRBC: 0 % (ref 0.0–0.2)

## 2020-10-01 LAB — HEPATITIS B SURFACE ANTIGEN: Hepatitis B Surface Ag: NONREACTIVE

## 2020-10-01 LAB — RENAL FUNCTION PANEL
Albumin: 2.4 g/dL — ABNORMAL LOW (ref 3.5–5.0)
Anion gap: 13 (ref 5–15)
BUN: 71 mg/dL — ABNORMAL HIGH (ref 8–23)
CO2: 18 mmol/L — ABNORMAL LOW (ref 22–32)
Calcium: 7.9 mg/dL — ABNORMAL LOW (ref 8.9–10.3)
Chloride: 110 mmol/L (ref 98–111)
Creatinine, Ser: 5.96 mg/dL — ABNORMAL HIGH (ref 0.61–1.24)
GFR, Estimated: 9 mL/min — ABNORMAL LOW (ref >60)
Glucose, Bld: 137 mg/dL — ABNORMAL HIGH (ref 70–99)
Phosphorus: 4.9 mg/dL — ABNORMAL HIGH (ref 2.5–4.6)
Potassium: 4.4 mmol/L (ref 3.5–5.1)
Sodium: 141 mmol/L (ref 135–145)

## 2020-10-01 LAB — BASIC METABOLIC PANEL
Anion gap: 11 (ref 5–15)
BUN: 67 mg/dL — ABNORMAL HIGH (ref 8–23)
CO2: 20 mmol/L — ABNORMAL LOW (ref 22–32)
Calcium: 7.9 mg/dL — ABNORMAL LOW (ref 8.9–10.3)
Chloride: 110 mmol/L (ref 98–111)
Creatinine, Ser: 5.73 mg/dL — ABNORMAL HIGH (ref 0.61–1.24)
GFR, Estimated: 9 mL/min — ABNORMAL LOW (ref >60)
Glucose, Bld: 106 mg/dL — ABNORMAL HIGH (ref 70–99)
Potassium: 4.3 mmol/L (ref 3.5–5.1)
Sodium: 141 mmol/L (ref 135–145)

## 2020-10-01 LAB — GLUCOSE, CAPILLARY
Glucose-Capillary: 89 mg/dL (ref 70–99)
Glucose-Capillary: 161 mg/dL — ABNORMAL HIGH (ref 70–99)
Glucose-Capillary: 99 mg/dL (ref 70–99)

## 2020-10-01 LAB — TROPONIN I (HIGH SENSITIVITY)
Troponin I (High Sensitivity): 2776 ng/L (ref <18)
Troponin I (High Sensitivity): 2891 ng/L (ref <18)

## 2020-10-01 LAB — HEPARIN LEVEL (UNFRACTIONATED): Heparin Unfractionated: 0.72 IU/mL — ABNORMAL HIGH (ref 0.30–0.70)

## 2020-10-01 LAB — MRSA PCR SCREENING: MRSA by PCR: POSITIVE — AB

## 2020-10-01 LAB — PH, BODY FLUID: pH, Body Fluid: 7.7

## 2020-10-01 LAB — CYTOLOGY - NON PAP

## 2020-10-01 MED ORDER — HEPARIN BOLUS VIA INFUSION
4000.0000 [IU] | Freq: Once | INTRAVENOUS | Status: AC
  Administered 2020-10-01: 4000 [IU] via INTRAVENOUS
  Filled 2020-10-01: qty 4000

## 2020-10-01 MED ORDER — HEPARIN SODIUM (PORCINE) 1000 UNIT/ML DIALYSIS
1000.0000 [IU] | INTRAMUSCULAR | Status: DC | PRN
  Filled 2020-10-01: qty 1

## 2020-10-01 MED ORDER — ALTEPLASE 2 MG IJ SOLR
2.0000 mg | Freq: Once | INTRAMUSCULAR | Status: DC | PRN
  Filled 2020-10-01: qty 2

## 2020-10-01 MED ORDER — SODIUM CHLORIDE 0.9 % IV SOLN: 100.0000 mL | INTRAVENOUS | Status: DC | PRN

## 2020-10-01 MED ORDER — LIDOCAINE-PRILOCAINE 2.5-2.5 % EX CREA
1.0000 "application " | TOPICAL_CREAM | CUTANEOUS | Status: DC | PRN
  Filled 2020-10-01: qty 5

## 2020-10-01 MED ORDER — HEPARIN (PORCINE) 25000 UT/250ML-% IV SOLN
1200.0000 [IU]/h | INTRAVENOUS | Status: DC
  Administered 2020-10-01: 1200 [IU]/h via INTRAVENOUS
  Administered 2020-10-02 – 2020-10-03 (×2): 1150 [IU]/h via INTRAVENOUS
  Administered 2020-10-04: 1200 [IU]/h via INTRAVENOUS
  Filled 2020-10-01 (×4): qty 250

## 2020-10-01 MED ORDER — MUPIROCIN 2 % EX OINT
1.0000 "application " | TOPICAL_OINTMENT | Freq: Two times a day (BID) | CUTANEOUS | Status: AC
  Administered 2020-10-01 – 2020-10-05 (×9): 1 via NASAL
  Filled 2020-10-01 (×4): qty 22

## 2020-10-01 MED ORDER — HEPARIN SODIUM (PORCINE) 1000 UNIT/ML IJ SOLN
INTRAMUSCULAR | Status: AC
  Filled 2020-10-01: qty 4

## 2020-10-01 MED ORDER — PENTAFLUOROPROP-TETRAFLUOROETH EX AERO
1.0000 "application " | INHALATION_SPRAY | CUTANEOUS | Status: DC | PRN
  Filled 2020-10-01: qty 116

## 2020-10-01 MED ORDER — HEPARIN SODIUM (PORCINE) 1000 UNIT/ML IJ SOLN
INTRAMUSCULAR | Status: AC
  Filled 2020-10-01: qty 1

## 2020-10-01 MED ORDER — LIDOCAINE HCL (PF) 1 % IJ SOLN: 5.0000 mL | INTRAMUSCULAR | Status: DC | PRN

## 2020-10-01 MED ORDER — LIDOCAINE HCL 1 % IJ SOLN
INTRAMUSCULAR | Status: DC | PRN
  Administered 2020-10-01: 5 mL

## 2020-10-01 MED ORDER — LIDOCAINE HCL 1 % IJ SOLN
INTRAMUSCULAR | Status: AC
  Filled 2020-10-01: qty 20

## 2020-10-01 MED ORDER — MUPIROCIN 2 % EX OINT: 1.0000 "application " | TOPICAL_OINTMENT | Freq: Two times a day (BID) | CUTANEOUS | Status: DC

## 2020-10-01 NOTE — Progress Notes (Signed)
Patient requiring increase of O2 from 4L to 7L HFNC. Paged Dr. Tyrell Antonio, per MD was advised to call nephrology. Nephrology was called but voicemail message states that voice mailbox is full and unable to leave message. Paged Dr. Tyrell Antonio again to let her know, awaiting further orders.

## 2020-10-01 NOTE — Progress Notes (Signed)
Patient Demographics:    Michael Trevino, is a 79 y.o. male, DOB - [birth year], UEA:[id number]  Admit date - 09/30/2020   Admitting Physician Dajha Urquilla Denton Brick, MD  Outpatient Primary MD for the patient is Dettinger, Fransisca Kaufmann, MD  LOS - 1   Chief Complaint  Patient presents with  . 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 male with past medical history of CAD (s/p prior MI at [facility] in 2000 but records not available but patient reports no stent placement at that time, low-risk NST in 2017), aortic regurgitation, HTN, HLD, Type 2 DM and Stage 5 CKD  with nephrotic syndrome, hypothyroidism and obesity admitted on 10/01/2020 with shortness of breath in the setting of acute CHF exacerbation and volume overload, developed chest pains overnight with a bump in troponin -Transfer to [facility] on 10/01/2020 for IR to place hemodialysis access to allow for hemodialysis and also for cardiology to do LHC probably on 10/04/2020 after HD access has been established  A/p 1)NSTEMI--patient with history of prior CAD, discussed with cardiology service, -Continue IV heparin and nitro drip -Plan is for [facility] after hemodialysis access has been established -Echo with EF of 55 to 60% with severe LVH  -EKG with incomplete LBBB  which appears chronic -C/n aspirin, metoprolol, Lipitor Troponin 79>>194>>2776>>2,891  2)CKD V--with significant volume overload, continue IV Lasix 120 mg twice daily -- IR to place HD access -Nephrology plans to initiate hemodialysis sessions once HD access has been placed  3)HFpEF--- patient with acute on chronic diastolic dysfunction CHF in the setting of CKD 5 and NSTEMI--- poor urine output despite IV Lasix would benefit from hemodialysis to address volume overload status avoid ACEI/ARB/ARNI due to renal concerns BNP =1,022  4)acute hypoxic respiratory failure--- in the setting of volume overload in a patient with worsening CKD 5, as well as acute on chronic diastolic CHF exacerbation with bilateral left more than right pleural effusions--oxygenation improved after left-sided thoracentesis with removal of 1 L on 09/30/20 -Continue supplemental oxygen, patient has been able to come off BiPAP -Anticipate improvement in oxygenation with improvement in CHF and after initiation of hemodialysis to address volume status -Pleural fluid studies pending  5)HTN--- patient had severe elevated BP, continue amlodipine 10 mg daily, hydralazine 50 mg 3 times daily  --continue nitro drip for BP control and ACS  6)chronic anemia of CKD-- In ED hemoglobin is 9.6 which is not far from his recent baseline--no evidence of ongoing bleeding, -EPO/ESA agent per nephrology service  7)DM2-A1c 6.7 reflecting excellent diabetic control PTA -Continue Levemir 25 units nightly -Use Novolog/Humalog  Sliding scale insulin with Accu-Cheks/Fingersticks as ordered   8)Hypothyroidism--continue levothyroxine 175 mcg daily  Disposition/Need for in-Hospital Stay- patient unable to be discharged at this time due to NSTEMI requiring IV heparin and IV nitro drip and acute hypoxic respiratory failure secondary to diastolic CHF exacerbation and volume overload in setting of worsening CKD 5 requiring IV diuresis, and  initiation  of hemodialysis*  Status is: Inpatient  Remains inpatient appropriate because:NSTEMI requiring IV heparin and IV nitro drip and acute hypoxic respiratory failure secondary to diastolic CHF exacerbation and volume overload in setting of worsening CKD 5 requiring IV diuresis, and  initiation of hemodialysis*  Disposition: The patient is from: Home              Anticipated d/c is to: Home              Anticipated d/c date is: > 3 days              Patient currently is not medically stable to d/c. Barriers: Not Clinically Stable- NSTEMI requiring IV heparin and IV nitro drip and acute hypoxic respiratory failure secondary to diastolic CHF exacerbation and volume overload in setting of worsening CKD 5 requiring IV diuresis, and  initiation of hemodialysis*  Code Status : full  Family Communication:    (patient is alert, awake and coherent)  Discussed with daughter at bedside  Consults  :  Cardiology/Nephrology/IR  DVT Prophylaxis  :  Iv heparin  Lab Results  Component Value Date   PLT 169 10/01/2020    Inpatient Medications  Scheduled Meds: . amLODipine  10 mg Oral Daily  . ascorbic acid  500 mg Oral Daily  . aspirin EC  81 mg Oral Daily  . atorvastatin  40 mg Oral Daily  . calcitRIOL  0.5 mcg Oral Daily  . calcium carbonate  1 tablet Oral BID WC  . Chlorhexidine Gluconate Cloth  6 each Topical Daily  . cholecalciferol  1,000 Units Oral Daily  . hydrALAZINE  50 mg Oral TID  . insulin aspart  0-5 Units Subcutaneous QHS  . insulin aspart  0-9 Units Subcutaneous TID WC  . insulin detemir  25 Units Subcutaneous QHS  . levothyroxine  175 mcg Oral QAC breakfast  . metoprolol tartrate  100 mg Oral BID  . multivitamin with minerals  1 tablet Oral Daily  . mupirocin ointment  1 application Nasal BID  . sodium chloride flush  3 mL Intravenous Q12H   Continuous Infusions: . sodium chloride    . furosemide Stopped (10/01/20 1132)  . heparin 1,200 Units/hr (10/01/20 1236)  .  nitroGLYCERIN 20 mcg/min (10/01/20 1236)   PRN Meds:.sodium chloride, acetaminophen **OR** acetaminophen, bisacodyl, nitroGLYCERIN, ondansetron **OR** ondansetron (ZOFRAN) IV, polyethylene glycol, sodium chloride flush, traZODone    Anti-infectives (From admission, onward)   None        Objective:   Vitals:   10/01/20 1200 10/01/20 1300 10/01/20 1430 10/01/20 1500  BP: (!) 146/43 (!) 176/62 (!) 166/66 (!) 147/65  Pulse: 73 88 86 79  Resp: (!) 24 (!) 30 14 20   Temp:   98.4 F (36.9 C)   TempSrc:   Oral   SpO2: 96% 91% 92% 93%  Weight:   105.1 kg   Height:   5\' 9"  (1.753 m)     Wt Readings from Last 3 Encounters:  10/01/20 105.1 kg  09/28/20 110.2 kg  09/06/20 106.6 kg     Intake/Output Summary (Last 24 hours) at 10/01/2020 1514 Last data  filed at 10/01/2020 1416 Gross per 24 hour  Intake 318.93 ml  Output 3000 ml  Net -2681.07 ml    Physical Exam Gen:- Awake Alert,  In no apparent distress  HEENT:- [place].AT, No sclera icterus Nose- [place] 3L/min Neck-Supple Neck,No JVD,.  Lungs-diminished in bases, no wheezing CV- S1, S2 normal, regular  Abd-  +ve B.Sounds, Abd Soft, No tenderness,    Extremity/Skin:-2 to  3+  edema, pedal pulses present  Psych-affect is appropriate, oriented x3 Neuro-generalized weakness, no new focal deficits, no tremors   Data Review:   Micro Results Recent Results (from the past 240 hour(s))  SARS CORONAVIRUS 2 (TAT 6-24 HRS) Nasopharyngeal Nasopharyngeal Swab     Status: None   Collection Time: 09/28/20 10:58 AM   Specimen: Nasopharyngeal Swab  Result Value Ref Range Status   SARS Coronavirus 2 NEGATIVE NEGATIVE Final    Comment: (NOTE) SARS-CoV-2 target nucleic acids are NOT DETECTED.  The SARS-CoV-2 RNA is generally detectable in upper and lower respiratory specimens during the acute phase of infection. Negative results do not preclude SARS-CoV-2 infection, do not rule out co-infections with other pathogens, and should not be used as  the sole basis for treatment or other patient management decisions. Negative results must be combined with clinical observations, patient history, and epidemiological information. The expected result is Negative.  Fact Sheet for Patients: SugarRoll.be  Fact Sheet for Healthcare Providers: https://www.woods-mathews.com/  This test is not yet approved or cleared by the [place] FDA and  has been authorized for detection and/or diagnosis of SARS-CoV-2 by FDA under an Emergency Use Authorization (EUA). This EUA will remain  in effect (meaning this test can be used) for the duration of the COVID-19 declaration under Se ction 564(b)(1) of the Act, 21 U.S.C. section 360bbb-3(b)(1), unless the authorization is terminated or revoked sooner.  Performed at [facility], [place] [address], [place],  [address]   Resp Panel by RT-PCR (Flu A&B, Covid)     Status: None   Collection Time: 09/30/20  6:54 AM  Result Value Ref Range Status   SARS Coronavirus 2 by RT PCR NEGATIVE NEGATIVE Final    Comment: (NOTE) SARS-CoV-2 target nucleic acids are NOT DETECTED.  The SARS-CoV-2 RNA is generally detectable in upper respiratory specimens during the acute phase of infection. The lowest concentration of SARS-CoV-2 viral copies this assay can detect is 138 copies/mL. A negative result does not preclude SARS-Cov-2 infection and should not be used as the sole basis for treatment or other patient management decisions. A negative result may occur with  improper specimen collection/handling, submission of specimen other than nasopharyngeal swab, presence of viral mutation(s) within the areas targeted by this assay, and inadequate number of viral copies(<138 copies/mL). A negative result must be combined with clinical observations, patient history, and epidemiological information. The expected result is Negative.  Fact Sheet for Patients:   EntrepreneurPulse.com.au  Fact Sheet for Healthcare Providers:  IncredibleEmployment.be  This test is no t yet approved or cleared by the [place] FDA and  has been authorized for detection and/or diagnosis of SARS-CoV-2 by FDA under an Emergency Use Authorization (EUA). This EUA will remain  in effect (meaning this test can be used) for the duration of the COVID-19 declaration under Section 564(b)(1) of the Act, 21 U.S.C.section 360bbb-3(b)(1), unless the authorization is terminated  or revoked sooner.       Influenza A by PCR NEGATIVE NEGATIVE Final   Influenza B by PCR NEGATIVE NEGATIVE Final  Comment: (NOTE) The Xpert Xpress SARS-CoV-2/FLU/RSV plus assay is intended as an aid in the diagnosis of influenza from Nasopharyngeal swab specimens and should not be used as a sole basis for treatment. Nasal washings and aspirates are unacceptable for Xpert Xpress SARS-CoV-2/FLU/RSV testing.  Fact Sheet for Patients: EntrepreneurPulse.com.au  Fact Sheet for Healthcare Providers: IncredibleEmployment.be  This test is not yet approved or cleared by the [place] FDA and has been authorized for detection and/or diagnosis of SARS-CoV-2 by FDA under an Emergency Use Authorization (EUA). This EUA will remain in effect (meaning this test can be used) for the duration of the COVID-19 declaration under Section 564(b)(1) of the Act, 21 U.S.C. section 360bbb-3(b)(1), unless the authorization is terminated or revoked.  Performed at [facility], [address] [place], [place] [address]   Culture, body fluid-bottle     Status: None (Preliminary result)   Collection Time: 09/30/20 11:26 AM   Specimen: Ascitic  Result Value Ref Range Status   Specimen Description ASCITIC  Final   Special Requests 10CC  Final   Culture   Final    NO GROWTH < 24 HOURS Performed at [facility], [address],  [place], [place] [address]    Report Status PENDING  Incomplete  Gram stain     Status: None   Collection Time: 09/30/20 11:26 AM   Specimen: Ascitic  Result Value Ref Range Status   Specimen Description ASCITIC  Final   Special Requests NONE  Final   Gram Stain   Final    SPECIMEN CLOTTED UNABLE TO PERFORM MATTHEWS, B 11.18.2021  Performed at [facility], [address] [place], [place] [address]    Report Status 09/30/2020 FINAL  Final  MRSA PCR Screening     Status: Abnormal   Collection Time: 09/30/20  1:58 PM   Specimen: Nasal Mucosa; Nasopharyngeal  Result Value Ref Range Status   MRSA by PCR POSITIVE (A) NEGATIVE Final    Comment:        The GeneXpert MRSA Assay (FDA approved for NASAL specimens only), is one component of a comprehensive MRSA colonization surveillance program. It is not intended to diagnose MRSA infection nor to guide or monitor treatment for MRSA infections. RESULT CALLED TO, READ BACK BY AND VERIFIED WITH: H EVANS,RN@[id number]  10/01/20 MKELLY Performed at [facility], [address] [place], [place] [address]     Radiology Reports [place] UE VEIN MAPPING LEFT (PRE-OP AVF)  Result Date: 09/06/2020 CLINICAL DATA:  79 year old male undergoing preoperative vascular survey prior to a AV fistula creation EXAM: [place] EXTREM UP VEIN MAPPING COMPARISON:  None. FINDINGS: RIGHT ARTERIES Wrist Radial Artery: Size 2.69mm Waveform biphasic Wrist Ulnar Artery: Size less than 2 mmmm Waveform biphasic Prox. Forearm Radial Artery: Size 3.39mm Waveform biphasic Upper Arm Brachial Artery: Size 5.23mm Waveform biphasic RIGHT VEINS Forearm Cephalic Vein: Prox 1.9JK Distal less than 74mm Depth 9.3OI Upper Arm Cephalic Vein: Prox 7.1IW Distal 3.41mm Depth 5.8KD Upper Arm Basilic Vein: Prox 9.8PJ Distal 3.64mm Depth 7.53mm Upper Arm Brachial Vein: Prox 7.23mm Distal 4.6mm Depth 17.22mm ADDITIONAL RIGHT VEINS Axillary Vein:  6.32mm Subclavian Vein: Patient: Yes Respiratory Phasicity: Present Internal  Jugular Vein: Patent: Yes    Respiratory Phasicity: Present Branches > 2 mm: Two branches arise from the basilic vein in the distal upper arm measuring 2.4 and 1.8 mm in diameter. Multiple branches arise from the brachial vein in the upper arm. LEFT ARTERIES Wrist Radial Artery: Size 2.45mm Waveform biphasic Wrist Ulnar Artery: Size less than  41mm Waveform biphasic Prox. Forearm Radial Artery: Size 3.76mm Waveform biphasic Upper Arm Brachial Artery: Size 5.62mm Waveform biphasic LEFT VEINS Forearm Cephalic Vein: Prox 5.1VO Distal less than 71mm Depth 1.6WV Upper Arm Cephalic Vein: Variant anatomy.  No upper arm cephalic vein. Upper Arm Basilic Vein: Prox 3.7TG Distal 4.6mm Depth 6.6mm Upper Arm Brachial Vein: Prox 7.43mm Distal 4.28mm Depth 9.57mm ADDITIONAL LEFT VEINS Axillary Vein:  7.68mm Subclavian Vein: Patient: Yes Respiratory Phasicity: Present Internal Jugular Vein: Patent: Yes    Respiratory Phasicity: Present Branches > 2 mm: Single branch arising from the basilic vein in the distal upper arm measuring 3.3 mm. IMPRESSION: 1. Small radial arteries at the wrists bilaterally. 2. No evidence of deep or superficial venous thrombosis. 3. Small right cephalic vein an absent left upper arm cephalic vein. 4. Robust bilateral upper arm basilic and brachial veins. Electronically Signed   By: Jacqulynn Cadet M.D.   On: 09/06/2020 11:36   DG Chest Portable 1 View  Result Date: 09/30/2020 CLINICAL DATA:  Status post thoracentesis. EXAM: PORTABLE CHEST 1 VIEW COMPARISON:  Earlier today at 6:49 a.m. FINDINGS: 11:19 a.m. Midline trachea. Mild cardiomegaly. Atherosclerosis in the transverse aorta. Decrease in small left pleural effusion. Probable tiny layering right pleural effusion. No pneumothorax. Mild congestive heart failure, improved. Persistent right and improved left base airspace disease. IMPRESSION: Decreased sided pleural effusion, without pneumothorax. Improved mild interstitial edema with left greater than right  small bilateral pleural effusions and adjacent Airspace disease, likely atelectasis. Aortic Atherosclerosis (ICD10-I70.0). Electronically Signed   By: Abigail Miyamoto M.D.   On: 09/30/2020 11:29   DG Chest Portable 1 View  Result Date: 09/30/2020 CLINICAL DATA:  Chest pain and shortness of breath EXAM: PORTABLE CHEST 1 VIEW COMPARISON:  11/30/2019 FINDINGS: Bilateral pleural effusion, large appearing on the left. Interstitial and hazy airspace density on both sides with cephalized blood flow. No pneumothorax. Partially obscured heart. IMPRESSION: CHF pattern with large left and small right pleural effusion. Electronically Signed   By: Monte Fantasia M.D.   On: 09/30/2020 07:16   [place] UE VEIN MAPPING RIGHT (PRE-OP AVF)  Result Date: 09/06/2020 CLINICAL DATA:  79 year old male undergoing preoperative vascular survey prior to a AV fistula creation EXAM: [place] EXTREM UP VEIN MAPPING COMPARISON:  None. FINDINGS: RIGHT ARTERIES Wrist Radial Artery: Size 2.56mm Waveform biphasic Wrist Ulnar Artery: Size less than 2 mmmm Waveform biphasic Prox. Forearm Radial Artery: Size 3.75mm Waveform biphasic Upper Arm Brachial Artery: Size 5.21mm Waveform biphasic RIGHT VEINS Forearm Cephalic Vein: Prox 6.2IR Distal less than 76mm Depth 4.8NI Upper Arm Cephalic Vein: Prox 6.2VO Distal 3.18mm Depth 3.5KK Upper Arm Basilic Vein: Prox 9.3GH Distal 3.28mm Depth 7.46mm Upper Arm Brachial Vein: Prox 7.72mm Distal 4.38mm Depth 17.38mm ADDITIONAL RIGHT VEINS Axillary Vein:  6.46mm Subclavian Vein: Patient: Yes Respiratory Phasicity: Present Internal Jugular Vein: Patent: Yes    Respiratory Phasicity: Present Branches > 2 mm: Two branches arise from the basilic vein in the distal upper arm measuring 2.4 and 1.8 mm in diameter. Multiple branches arise from the brachial vein in the upper arm. LEFT ARTERIES Wrist Radial Artery: Size 2.38mm Waveform biphasic Wrist Ulnar Artery: Size less than 34mm Waveform biphasic Prox. Forearm Radial Artery: Size 3.41mm  Waveform biphasic Upper Arm Brachial Artery: Size 5.84mm Waveform biphasic LEFT VEINS Forearm Cephalic Vein: Prox 8.2XH Distal less than 5mm Depth 3.7JI Upper Arm Cephalic Vein: Variant anatomy.  No upper arm cephalic vein. Upper Arm Basilic Vein: Prox 9.6VE Distal 4.53mm Depth 6.59mm Upper Arm Brachial  Vein: Prox 7.43mm Distal 4.38mm Depth 9.97mm ADDITIONAL LEFT VEINS Axillary Vein:  7.53mm Subclavian Vein: Patient: Yes Respiratory Phasicity: Present Internal Jugular Vein: Patent: Yes    Respiratory Phasicity: Present Branches > 2 mm: Single branch arising from the basilic vein in the distal upper arm measuring 3.3 mm. IMPRESSION: 1. Small radial arteries at the wrists bilaterally. 2. No evidence of deep or superficial venous thrombosis. 3. Small right cephalic vein an absent left upper arm cephalic vein. 4. Robust bilateral upper arm basilic and brachial veins. Electronically Signed   By: Jacqulynn Cadet M.D.   On: 09/06/2020 11:36   ECHOCARDIOGRAM COMPLETE  Result Date: 09/30/2020    ECHOCARDIOGRAM REPORT   Patient Name:   Michael Trevino Date of Exam: 09/30/2020 Medical Rec #:  [id number]    Height:       69.0 in Accession #:    [id number]   Weight:       240.0 lb Date of Birth:  1941-05-29    BSA:          2.232 m Patient Age:    75 years     BP:           167/58 mmHg Patient Gender: M            HR:           74 bpm. Exam Location:  [facility] Procedure: 2D Echo Indications:     Dyspnea 786.09 / R06.00  History:         Patient has prior history of Echocardiogram examinations, most                  recent 09/24/2019. CHF; Risk Factors:Former Smoker,                  Dyslipidemia, Hypertension and Diabetes. Nonrheumatic aortic                  valve insufficiency.  Sonographer:     Leavy Cella RDCS (AE) Referring Phys:  [id number] Arnoldo Lenis Diagnosing Phys: Carlyle Dolly MD IMPRESSIONS  1. Left ventricular ejection fraction, by estimation, is 55 to 60%. The left ventricle has normal function. The left  ventricle has no regional wall motion abnormalities. There is severe left ventricular hypertrophy. Left ventricular diastolic parameters  are indeterminate. Elevated left atrial pressure.  2. Right ventricular systolic function is normal. The right ventricular size is normal.  3. A small pericardial effusion is present. The pericardial effusion is circumferential. There is no evidence of cardiac tamponade. Large pleural effusion in the left lateral region.  4. The mitral valve is normal in structure. Trivial mitral valve regurgitation. No evidence of mitral stenosis.  5. The aortic valve was not well visualized. There is mild calcification of the aortic valve. There is mild thickening of the aortic valve. Aortic valve regurgitation is moderate. Moderate aortic valve stenosis.  6. The inferior vena cava is normal in size with greater than 50% respiratory variability, suggesting right atrial pressure of 3 mmHg. FINDINGS  Left Ventricle: Left ventricular ejection fraction, by estimation, is 55 to 60%. The left ventricle has normal function. The left ventricle has no regional wall motion abnormalities. The left ventricular internal cavity size was normal in size. There is  severe left ventricular hypertrophy. Left ventricular diastolic parameters are indeterminate. Elevated left atrial pressure. Right Ventricle: The right ventricular size is normal. No increase in right ventricular wall thickness. Right ventricular systolic function is normal. Left Atrium: Left atrial  size was normal in size. Right Atrium: Right atrial size was normal in size. Pericardium: A small pericardial effusion is present. The pericardial effusion is circumferential. There is no evidence of cardiac tamponade. Mitral Valve: The mitral valve is normal in structure. There is mild thickening of the mitral valve leaflet(s). There is mild calcification of the mitral valve leaflet(s). Mild mitral annular calcification. Trivial mitral valve  regurgitation. No evidence  of mitral valve stenosis. Tricuspid Valve: The tricuspid valve is not well visualized. Tricuspid valve regurgitation is mild . No evidence of tricuspid stenosis. Aortic Valve: The aortic valve was not well visualized. There is mild calcification of the aortic valve. There is mild thickening of the aortic valve. There is mild aortic valve annular calcification. Aortic valve regurgitation is moderate. Aortic regurgitation PHT measures 349 msec. Moderate aortic stenosis is present. Aortic valve mean gradient measures 19.4 mmHg. Aortic valve peak gradient measures 34.7 mmHg. Aortic valve area, by VTI measures 1.30 cm. Pulmonic Valve: The pulmonic valve was not well visualized. Pulmonic valve regurgitation is not visualized. No evidence of pulmonic stenosis. Aorta: The aortic root is normal in size and structure. Pulmonary Artery: Indeterminate PASP, inadequate TR jet. Venous: The inferior vena cava is normal in size with greater than 50% respiratory variability, suggesting right atrial pressure of 3 mmHg. IAS/Shunts: No atrial level shunt detected by color flow Doppler. Additional Comments: There is a large pleural effusion in the left lateral region.  LEFT VENTRICLE PLAX 2D LVIDd:         5.34 cm  Diastology LVIDs:         2.37 cm  LV e' lateral:   3.59 cm/s LV PW:         2.03 cm  LV E/e' lateral: 34.3 LV IVS:        1.69 cm LVOT diam:     2.10 cm LV SV:         87 LV SV Index:   39 LVOT Area:     3.46 cm  RIGHT VENTRICLE RV S prime:     14.20 cm/s TAPSE (M-mode): 2.2 cm LEFT ATRIUM             Index       RIGHT ATRIUM          Index LA diam:        3.40 cm 1.52 cm/m  RA Area:     8.96 cm LA Vol (A2C):   70.3 ml 31.49 ml/m RA Volume:   17.80 ml 7.97 ml/m LA Vol (A4C):   47.8 ml 21.41 ml/m LA Biplane Vol: 58.7 ml 26.29 ml/m  AORTIC VALVE AV Area (Vmax):    1.29 cm AV Area (Vmean):   1.24 cm AV Area (VTI):     1.30 cm AV Vmax:           294.66 cm/s AV Vmean:          207.077 cm/s  AV VTI:            0.669 m AV Peak Grad:      34.7 mmHg AV Mean Grad:      19.4 mmHg LVOT Vmax:         109.33 cm/s LVOT Vmean:        73.863 cm/s LVOT VTI:          0.252 m LVOT/AV VTI ratio: 0.38 AI PHT:            349 msec  AORTA Ao Root diam:  3.10 cm MITRAL VALVE MV Area (PHT): 3.23 cm     SHUNTS MV Decel Time: 235 msec     Systemic VTI:  0.25 m MV E velocity: 123.00 cm/s  Systemic Diam: 2.10 cm MV A velocity: 101.00 cm/s MV E/A ratio:  1.22 Carlyle Dolly MD Electronically signed by Carlyle Dolly MD Signature Date/Time: 09/30/2020/12:04:11 PM    Final (Updated)    US THORACENTESIS ASP PLEURAL SPACE W/IMG GUIDE  Result Date: 09/30/2020 INDICATION: Left pleural effusion CHF EXAM: ULTRASOUND GUIDED LEFT THORACENTESIS MEDICATIONS: 10 cc 1% lidocaine. COMPLICATIONS: None immediate. PROCEDURE: An ultrasound guided thoracentesis was thoroughly discussed with the patient and questions answered. The benefits, risks, alternatives and complications were also discussed. The patient understands and wishes to proceed with the procedure. Written consent was obtained. Ultrasound was performed to localize and mark an adequate pocket of fluid in the left chest. The area was then prepped and draped in the normal sterile fashion. 1% Lidocaine was used for local anesthesia. Under ultrasound guidance a 19 G Yueh catheter was introduced. Thoracentesis was performed. The catheter was removed and a dressing applied. FINDINGS: A total of approximately 1 liter of blood tinged fluid was removed. Samples were sent to the laboratory as requested by the clinical team. IMPRESSION: Successful ultrasound guided left thoracentesis yielding 1 liter of pleural fluid. No PTX post procedure CXR Read by Lavonia Drafts [facility] Electronically Signed   By: Abigail Miyamoto M.D.   On: 09/30/2020 11:30     CBC Recent Labs  Lab 09/28/20 1122 09/30/20 0654 10/01/20 0417  WBC 4.9 7.7 4.9  HGB 9.0* 9.6* 8.5*  HCT 27.7* 29.8* 26.4*  PLT 189 194  169  MCV 88.5 92.8 92.3  MCH 28.8 29.9 29.7  MCHC 32.5 32.2 32.2  RDW 14.3 14.3 14.2  LYMPHSABS 1.9 3.1  --   MONOABS 0.4 0.4  --   EOSABS 0.0 0.1  --   BASOSABS 0.0 0.0  --     Chemistries  Recent Labs  Lab 09/28/20 1122 09/30/20 0654 10/01/20 0417  NA 142 142 141  K 4.4 4.4 4.3  CL 112* 113* 110  CO2 19* 18* 20*  GLUCOSE 108* 141* 106*  BUN 62* 66* 67*  CREATININE 5.79* 5.56* 5.73*  CALCIUM 8.0* 8.2* 7.9*  AST  --  15  --   ALT  --  16  --   ALKPHOS  --  58  --   BILITOT  --  0.5  --    ------------------------------------------------------------------------------------------------------------------ No results for input(s): CHOL, HDL, LDLCALC, TRIG, CHOLHDL, LDLDIRECT in the last 72 hours.  Lab Results  Component Value Date   HGBA1C 6.7 (H) 09/28/2020   ------------------------------------------------------------------------------------------------------------------ No results for input(s): TSH, T4TOTAL, T3FREE, THYROIDAB in the last 72 hours.  Invalid input(s): FREET3 ------------------------------------------------------------------------------------------------------------------ No results for input(s): VITAMINB12, FOLATE, FERRITIN, TIBC, IRON, RETICCTPCT in the last 72 hours.  Coagulation profile Recent Labs  Lab 09/30/20 0654  INR 1.0    No results for input(s): DDIMER in the last 72 hours.  Cardiac Enzymes No results for input(s): CKMB, TROPONINI, MYOGLOBIN in the last 168 hours.  Invalid input(s): CK ------------------------------------------------------------------------------------------------------------------    Component Value Date/Time   BNP 1,022.0 (H) 09/30/2020 8270     Roxan Hockey M.D on 10/01/2020 at 3:14 PM  Go to [email or web address] - for contact info  [facility] - Office  [phone]

## 2020-10-01 NOTE — Procedures (Signed)
Interventional Radiology Procedure Note ° °Procedure: RT IJ TEMP HD CATH   ° °Complications: None ° °Estimated Blood Loss:  MIN ° °Findings: °TIP SVCRA   ° °M. TREVOR Rony Ratz, MD ° ° ° °

## 2020-10-01 NOTE — Progress Notes (Addendum)
Patient seen and examined. He is not in any distress. He denies worsening chest pain. Mild tightness.  His Oxygen sat 92 % om 4 L.  -Continue and titrate nitroglycerine gtt as needed.  -Continue with heparin gtt.  -Plan for HD per nephrologist. - -Cardiology following.   Addendum 6;45PM Called by nurse, patient requiring now 7 L high flow.  I contacted Dr [employer], he will arrange for HD tonight.  Lasix drip change to 20cc/hr.   Latalia Etzler, Md.

## 2020-10-01 NOTE — Progress Notes (Signed)
ANTICOAGULATION CONSULT NOTE - Initial Consult  Pharmacy Consult for heparin Indication: chest pain/ACS  Allergies  Allergen Reactions   Zocor [Simvastatin - High Dose] Nausea Only    Unknown    Patient Measurements: Height: 5\' 9"  (175.3 cm) Weight: 106.6 kg (235 lb 0.2 oz) IBW/kg (Calculated) : 70.7 Heparin Dosing Weight: 94  Vital Signs: Temp: 98.8 F (37.1 C) (11/19 0400) Temp Source: Oral (11/19 0400) BP: 177/55 (11/19 0700) Pulse Rate: 81 (11/19 0700)  Labs: Recent Labs    09/28/20 1122 09/28/20 1122 09/30/20 0654 09/30/20 0854 10/01/20 0417 10/01/20 0458  HGB 9.0*   < > 9.6*  --  8.5*  --   HCT 27.7*  --  29.8*  --  26.4*  --   PLT 189  --  194  --  169  --   LABPROT  --   --  12.9  --   --   --   INR  --   --  1.0  --   --   --   CREATININE 5.79*  --  5.56*  --  5.73*  --   TROPONINIHS  --   --  79* 194*  --  2,776*   < > = values in this interval not displayed.    Estimated Creatinine Clearance: 12.6 mL/min (A) (by C-G formula based on SCr of 5.73 mg/dL (H)).   Medical History: Past Medical History:  Diagnosis Date   Arthritis    Diabetes mellitus without complication ([place])    Heart attack ([place])    Hyperlipidemia    Hypertension    MI (myocardial infarction) ([place]) 2000   Renal disorder    Sepsis ([place])    Sepsis due to Streptococcus, group B ([place]) 02/24/2013   Stroke ([place])    Thyroid disease    hypothyroid    Medications:  Medications Prior to Admission  Medication Sig Dispense Refill Last Dose   amLODipine (NORVASC) 10 MG tablet TAKE 1 TABLET BY MOUTH EVERY DAY (Patient taking differently: Take 10 mg by mouth daily. ) 90 tablet 1 09/30/2020 at Unknown time   ascorbic acid (VITAMIN C) 500 MG tablet Take 500 mg by mouth daily.   09/29/2020 at Unknown time   aspirin EC 81 MG tablet Take 81 mg by mouth daily.   09/29/2020 at 0800   atorvastatin (LIPITOR) 40 MG tablet TAKE 1 TABLET BY MOUTH EVERY DAY (Patient taking differently:  Take 40 mg by mouth daily. ) 90 tablet 1 09/29/2020 at Unknown time   calcitRIOL (ROCALTROL) 0.5 MCG capsule Take 0.5 mcg by mouth daily.   09/29/2020 at Unknown time   Calcium Carbonate 500 MG CHEW Chew 1 tablet (500 mg total) by mouth 3 (three) times daily as needed. (Patient taking differently: Chew 500 mg by mouth daily. ) 30 each     cholecalciferol (VITAMIN D3) 25 MCG (1000 UNIT) tablet Take 1,000 Units by mouth daily.      furosemide (LASIX) 80 MG tablet Take 1 tablet (80 mg total) by mouth 2 (two) times daily. 180 tablet 1 09/29/2020 at Unknown time   glucose blood test strip Use to check blood glucose once daily.  Dx:  E11.9 100 each 3 09/29/2020 at Unknown time   hydrALAZINE (APRESOLINE) 10 MG tablet Take 1 tablet (10 mg total) by mouth 3 (three) times daily. 90 tablet 3 09/29/2020 at Unknown time   insulin detemir (LEVEMIR FLEXPEN) 100 UNIT/ML FlexPen Inject 25 Units into the skin at bedtime. 45 mL 2  09/29/2020 at Unknown time   levothyroxine (SYNTHROID) 175 MCG tablet TAKE 1 TABLET (175 MCG TOTAL) BY MOUTH DAILY BEFORE BREAKFAST. 90 tablet 1 09/30/2020 at Unknown time   metoprolol tartrate (LOPRESSOR) 100 MG tablet TAKE 1 TABLET BY MOUTH TWICE A DAY (Patient taking differently: Take 100 mg by mouth 2 (two) times daily. ) 180 tablet 3 09/30/2020 at 0600   nitroGLYCERIN (NITROSTAT) 0.4 MG SL tablet Place 1 tablet (0.4 mg total) under the tongue every 5 (five) minutes as needed for chest pain. 50 tablet 3     Assessment: Pharmacy consulted to dose heparin in patient with ACS/NSTEMI.  Patient troponin elevated at 2776. Patient Is not on anticoagulation prior to admission (has been on heparin subq inpatient).  Goal of Therapy:  Heparin level 0.3-0.7 units/ml Monitor platelets by anticoagulation protocol: Yes   Plan:  Give 4000 units bolus x 1 Start heparin infusion at 1200 units/hr Check anti-Xa level in 8 hours and daily while on heparin Continue to monitor H&H and  platelets  Margot Ables, PharmD Clinical Pharmacist 10/01/2020 7:44 AM

## 2020-10-01 NOTE — Progress Notes (Signed)
CRITICAL VALUE ALERT  Critical Value:  Troponin  2891  Date & Time Notied:  10/01/20 Provider Notified:Branch and Emokpae  Orders Received/Actions taken:Heparin gtt, NTG gtt, Awaiting transfer to [facility]

## 2020-10-01 NOTE — Progress Notes (Addendum)
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 Is in the process of dialysis planning as an outpatient, progressing to ESRD. Nephrologist: Dr. Hollie Salk ([facility]). Was last seen on 11/5 and Cr at that time was 4.8. -starting HD while in-house given early uremic symptoms. IR consulted (pending) for tunneled dialysis catheter, will also need VVS consult for access placement prior to d/c but I would favor on stabilizing his respiratory status first. -will be moving to [facility] hopefully soon so can potentially start HD tomorrow (slow start protocol), will need to coordinate HD around his potential cardiac cath -consider foley placement for I/O -Continue to monitor daily Cr, Dose meds for GFR<15 -Monitor Daily I/Os, Daily weight  -Maintain MAP>65 for optimal renal perfusion.  - avoid further nephrotoxins including NSAIDS, Morphine.  Unless absolutely necessary, avoid CT with contrast and/or MRI with gadolinium.     Hypoxic respiratory distress with large left pleural effusion -s/p left thora 11/17: 1L blood tinged fluid drained -lasix 120mg  IV BID, uop ~2L -strict I/O -on tridil gtt -echo: diastolic dysfntn  Chest pain, elevated troponins -on hep gtt now -pain better with ntg gtt -being transferred to [facility], heart cath possibly on Monday? -cardio on board, appreciate recommendations  Hypertension: -on ntg gtt  Secondary hyperparathyroidism -continue home calcitriol. Restrict phos in diet. Monitor phos  Metabolic acidosis -start sodium bicarb 1300mg  TID  Anemia due to chronic disease -Transfuse for Hgb<7 g/dL -is not on iron or ESAs yet, repeat iron panel  Diabetes Mellitus Type 2 with Hyperglycemia: mgmt per primary service  Subjective:   Chest pain over night, started on hep gtt. Remains on tridil. S/p left thora yesterday. uop 1950cc. Feels better form a respiratory standpoint,  still has some dysgeusia which unchanged otherwise no new complaints   Objective:   BP (!) 177/55   Pulse 78   Temp 98.7 F (37.1 C) (Oral)   Resp (!) 24   Ht 5\' 9"  (1.753 m)   Wt 106.6 kg   SpO2 96%   BMI 34.71 kg/m   Intake/Output Summary (Last 24 hours) at 10/01/2020 0825 Last data filed at 10/01/2020 0600 Gross per 24 hour  Intake 104.08 ml  Output 1800 ml  Net -1695.92 ml   Weight change: -1.863 kg  Physical Exam: Gen:nad, sitting up in bed CVS:s1s2, rrr Resp:decreased breath sounds bibasilar, on [place], unlabored, bl chest expansion JKK:XFGH, nt/nd WEX:HBZJI edema bilateral LE's Neuro: no gross focal deficits, no asterixis  Imaging: DG Chest Portable 1 View  Result Date: 09/30/2020 CLINICAL DATA:  Status post thoracentesis. EXAM: PORTABLE CHEST 1 VIEW COMPARISON:  Earlier today at 6:49 a.m. FINDINGS: 11:19 a.m. Midline trachea. Mild cardiomegaly. Atherosclerosis in the transverse aorta. Decrease in small left pleural effusion. Probable tiny layering right pleural effusion. No pneumothorax. Mild congestive heart failure, improved. Persistent right and improved left base airspace disease. IMPRESSION: Decreased sided pleural effusion, without pneumothorax. Improved mild interstitial edema with left greater than right small bilateral pleural effusions and adjacent Airspace disease, likely atelectasis. Aortic Atherosclerosis (ICD10-I70.0). Electronically Signed   By: Abigail Miyamoto M.D.   On: 09/30/2020 11:29   DG Chest Portable 1 View  Result Date: 09/30/2020 CLINICAL DATA:  Chest pain and shortness of breath EXAM: PORTABLE CHEST 1 VIEW COMPARISON:  11/30/2019 FINDINGS: Bilateral pleural effusion, large appearing on the left. Interstitial and hazy airspace density on both sides with cephalized blood flow. No pneumothorax. Partially obscured heart. IMPRESSION:  CHF pattern with large left and small right pleural effusion. Electronically Signed   By: Monte Fantasia M.D.   On:  09/30/2020 07:16   ECHOCARDIOGRAM COMPLETE  Result Date: 09/30/2020    ECHOCARDIOGRAM REPORT   Patient Name:   Michael Trevino Date of Exam: 09/30/2020 Medical Rec #:  [id number]    Height:       69.0 in Accession #:    [id number]   Weight:       240.0 lb Date of Birth:  [birth year]    BSA:          2.232 m Patient Age:    79 years     BP:           167/58 mmHg Patient Gender: M            HR:           74 bpm. Exam Location:  [facility] Procedure: 2D Echo Indications:     Dyspnea 786.09 / R06.00  History:         Patient has prior history of Echocardiogram examinations, most                  recent 09/24/2019. CHF; Risk Factors:Former Smoker,                  Dyslipidemia, Hypertension and Diabetes. Nonrheumatic aortic                  valve insufficiency.  Sonographer:     Leavy Cella RDCS (AE) Referring Phys:  [id number] Arnoldo Lenis Diagnosing Phys: Carlyle Dolly MD IMPRESSIONS  1. Left ventricular ejection fraction, by estimation, is 55 to 60%. The left ventricle has normal function. The left ventricle has no regional wall motion abnormalities. There is severe left ventricular hypertrophy. Left ventricular diastolic parameters  are indeterminate. Elevated left atrial pressure.  2. Right ventricular systolic function is normal. The right ventricular size is normal.  3. A small pericardial effusion is present. The pericardial effusion is circumferential. There is no evidence of cardiac tamponade. Large pleural effusion in the left lateral region.  4. The mitral valve is normal in structure. Trivial mitral valve regurgitation. No evidence of mitral stenosis.  5. The aortic valve was not well visualized. There is mild calcification of the aortic valve. There is mild thickening of the aortic valve. Aortic valve regurgitation is moderate. Moderate aortic valve stenosis.  6. The inferior vena cava is normal in size with greater than 50% respiratory variability, suggesting right atrial pressure of 3 mmHg.  FINDINGS  Left Ventricle: Left ventricular ejection fraction, by estimation, is 55 to 60%. The left ventricle has normal function. The left ventricle has no regional wall motion abnormalities. The left ventricular internal cavity size was normal in size. There is  severe left ventricular hypertrophy. Left ventricular diastolic parameters are indeterminate. Elevated left atrial pressure. Right Ventricle: The right ventricular size is normal. No increase in right ventricular wall thickness. Right ventricular systolic function is normal. Left Atrium: Left atrial size was normal in size. Right Atrium: Right atrial size was normal in size. Pericardium: A small pericardial effusion is present. The pericardial effusion is circumferential. There is no evidence of cardiac tamponade. Mitral Valve: The mitral valve is normal in structure. There is mild thickening of the mitral valve leaflet(s). There is mild calcification of the mitral valve leaflet(s). Mild mitral annular calcification. Trivial mitral valve regurgitation. No evidence  of mitral valve stenosis. Tricuspid Valve: The tricuspid  valve is not well visualized. Tricuspid valve regurgitation is mild . No evidence of tricuspid stenosis. Aortic Valve: The aortic valve was not well visualized. There is mild calcification of the aortic valve. There is mild thickening of the aortic valve. There is mild aortic valve annular calcification. Aortic valve regurgitation is moderate. Aortic regurgitation PHT measures 349 msec. Moderate aortic stenosis is present. Aortic valve mean gradient measures 19.4 mmHg. Aortic valve peak gradient measures 34.7 mmHg. Aortic valve area, by VTI measures 1.30 cm. Pulmonic Valve: The pulmonic valve was not well visualized. Pulmonic valve regurgitation is not visualized. No evidence of pulmonic stenosis. Aorta: The aortic root is normal in size and structure. Pulmonary Artery: Indeterminate PASP, inadequate TR jet. Venous: The inferior vena cava  is normal in size with greater than 50% respiratory variability, suggesting right atrial pressure of 3 mmHg. IAS/Shunts: No atrial level shunt detected by color flow Doppler. Additional Comments: There is a large pleural effusion in the left lateral region.  LEFT VENTRICLE PLAX 2D LVIDd:         5.34 cm  Diastology LVIDs:         2.37 cm  LV e' lateral:   3.59 cm/s LV PW:         2.03 cm  LV E/e' lateral: 34.3 LV IVS:        1.69 cm LVOT diam:     2.10 cm LV SV:         87 LV SV Index:   39 LVOT Area:     3.46 cm  RIGHT VENTRICLE RV S prime:     14.20 cm/s TAPSE (M-mode): 2.2 cm LEFT ATRIUM             Index       RIGHT ATRIUM          Index LA diam:        3.40 cm 1.52 cm/m  RA Area:     8.96 cm LA Vol (A2C):   70.3 ml 31.49 ml/m RA Volume:   17.80 ml 7.97 ml/m LA Vol (A4C):   47.8 ml 21.41 ml/m LA Biplane Vol: 58.7 ml 26.29 ml/m  AORTIC VALVE AV Area (Vmax):    1.29 cm AV Area (Vmean):   1.24 cm AV Area (VTI):     1.30 cm AV Vmax:           294.66 cm/s AV Vmean:          207.077 cm/s AV VTI:            0.669 m AV Peak Grad:      34.7 mmHg AV Mean Grad:      19.4 mmHg LVOT Vmax:         109.33 cm/s LVOT Vmean:        73.863 cm/s LVOT VTI:          0.252 m LVOT/AV VTI ratio: 0.38 AI PHT:            349 msec  AORTA Ao Root diam: 3.10 cm MITRAL VALVE MV Area (PHT): 3.23 cm     SHUNTS MV Decel Time: 235 msec     Systemic VTI:  0.25 m MV E velocity: 123.00 cm/s  Systemic Diam: 2.10 cm MV A velocity: 101.00 cm/s MV E/A ratio:  1.22 Carlyle Dolly MD Electronically signed by Carlyle Dolly MD Signature Date/Time: 09/30/2020/12:04:11 PM    Final (Updated)    US THORACENTESIS ASP PLEURAL SPACE W/IMG GUIDE  Result Date: 09/30/2020  INDICATION: Left pleural effusion CHF EXAM: ULTRASOUND GUIDED LEFT THORACENTESIS MEDICATIONS: 10 cc 1% lidocaine. COMPLICATIONS: None immediate. PROCEDURE: An ultrasound guided thoracentesis was thoroughly discussed with the patient and questions answered. The benefits, risks,  alternatives and complications were also discussed. The patient understands and wishes to proceed with the procedure. Written consent was obtained. Ultrasound was performed to localize and mark an adequate pocket of fluid in the left chest. The area was then prepped and draped in the normal sterile fashion. 1% Lidocaine was used for local anesthesia. Under ultrasound guidance a 19 G Yueh catheter was introduced. Thoracentesis was performed. The catheter was removed and a dressing applied. FINDINGS: A total of approximately 1 liter of blood tinged fluid was removed. Samples were sent to the laboratory as requested by the clinical team. IMPRESSION: Successful ultrasound guided left thoracentesis yielding 1 liter of pleural fluid. No PTX post procedure CXR Read by Lavonia Drafts [facility] Electronically Signed   By: Abigail Miyamoto M.D.   On: 09/30/2020 11:30    Labs: BMET Recent Labs  Lab 09/28/20 1122 09/30/20 0654 10/01/20 0417  NA 142 142 141  K 4.4 4.4 4.3  CL 112* 113* 110  CO2 19* 18* 20*  GLUCOSE 108* 141* 106*  BUN 62* 66* 67*  CREATININE 5.79* 5.56* 5.73*  CALCIUM 8.0* 8.2* 7.9*   CBC Recent Labs  Lab 09/28/20 1122 09/30/20 0654 10/01/20 0417  WBC 4.9 7.7 4.9  NEUTROABS 2.6 4.1  --   HGB 9.0* 9.6* 8.5*  HCT 27.7* 29.8* 26.4*  MCV 88.5 92.8 92.3  PLT 189 194 169    Medications:    . amLODipine  10 mg Oral Daily  . ascorbic acid  500 mg Oral Daily  . aspirin EC  81 mg Oral Daily  . atorvastatin  40 mg Oral Daily  . calcitRIOL  0.5 mcg Oral Daily  . calcium carbonate  1 tablet Oral BID WC  . Chlorhexidine Gluconate Cloth  6 each Topical Daily  . cholecalciferol  1,000 Units Oral Daily  . hydrALAZINE  50 mg Oral TID  . insulin aspart  0-5 Units Subcutaneous QHS  . insulin aspart  0-9 Units Subcutaneous TID WC  . insulin detemir  25 Units Subcutaneous QHS  . levothyroxine  175 mcg Oral QAC breakfast  . metoprolol tartrate  100 mg Oral BID  . multivitamin with minerals  1  tablet Oral Daily  . mupirocin ointment  1 application Nasal BID  . sodium chloride flush  3 mL Intravenous Q12H      Gean Quint, MD [facility] 10/01/2020, 8:25 AM

## 2020-10-01 NOTE — Progress Notes (Signed)
6:03 AM RN called due to critical value of troponin at 2776 from 194.  Patient was admitted yesterday (11/18) due to acute on chronic diastolic dysfunction CHF exacerbation.  Patient was started on IV nitroglycerin drip due to hypotension, per RN, patient was no longer complaining of chest pain since he has been on IV nitro drip.  EKG was done and showed normal sinus rhythm at rate of 82 bpm with LAFB.  Empiric heparin drip will be started pending rule out of NSTEMI.

## 2020-10-01 NOTE — Progress Notes (Signed)
Received call about worsening respiratory status   Will arrange for dialysis as soon as possible

## 2020-10-01 NOTE — Consult Note (Addendum)
Cardiology Consult    Patient ID: Michael [facility]; [id number]; 1941/07/12   Admit date: 09/30/2020 Date of Consult: 10/01/2020  Primary Care Provider: Dettinger, Fransisca Kaufmann, MD Primary Cardiologist: Minus Breeding, MD   Patient Profile    Michael [facility]ino is a 79 y.o. male with past medical history of CAD (s/p prior MI at [facility] in 2000 but records not available but patient reports no stent placement at that time, low-risk NST in 2017), aortic regurgitation, HTN, HLD, Type 2 DM and Stage 4-5 CKD who is being seen today for the evaluation of CHF and elevated troponin values at the request of Dr. Denton Brick.   History of Present Illness    Michael [facility]ino presented to [facility] on 09/30/2020 for planned AV Fistula creation by Dr. Donnetta Hutching but reported episodes of chest pain upon checking in for his procedure, therefore his surgery was canceled and he was taken to the Emergency Department for further evaluation.    Initial labs showed WBC 7.7, Hgb 9.6, platelets 194, Na+ 142, K+ 4.4, creatinine 5.56. BNP 1022. Initial HS Troponin 79 with repeat of 194. CXR was consistent with CHF and showed a large left pleural effusion and small right pleural effusion. EKG showed NSR, HR 92 with incomplete LBBB.   He did undergo a thoracentesis with 1L of blood-tinged fluid removed. He initially required BiPAP but was able to be weaned to nasal cannula. An echocardiogram was performed and showed a preserved EF of 55-60% with severe LVH, normal RV function, a small pericardial effusion, trivial MR and moderate AI.   Nephrology was consulted on the patient and he was started on IV Lasix 80mg  BID with plans to increase to 120mg  BID pending urine output.   Overnight, he developed recurrent chest pain and repeat HS Troponin is now elevated at 2776. He was again started on IV NTG with resolution of his symptoms and also started on IV Heparin.   In talking with the patient today, he reports he has actually been having  episodes of exertional chest pain for 2-3 years. Occurs when walking for longer distances and he typically limits his activity to avoid chest pain. Reports associated dyspnea when this occurs and does use SL NTG with improvement in his symptoms. He denies any specific orthopnea or PND. Does have baseline dyspnea on exertion and lower extremity edema. Denies any recurrent chest pain this morning after being restarted on IV NTG. Says his breathing has also improved and he has a recorded output of -1.8L thus far. He reports Nephrology has already rounded on him and are planning for transfer to [facility] today for HD access.   Past Medical History:  Diagnosis Date  . Arthritis   . Diabetes mellitus without complication ([place])   . Heart attack ([place])   . Hyperlipidemia   . Hypertension   . MI (myocardial infarction) ([place]) 2000  . Renal disorder   . Sepsis ([place])   . Sepsis due to Streptococcus, group B ([place]) 02/24/2013  . Stroke ([place])   . Thyroid disease    hypothyroid    Past Surgical History:  Procedure Laterality Date  . CARDIAC CATHETERIZATION  2000  . TEE WITHOUT CARDIOVERSION N/A 02/26/2013   Procedure: TRANSESOPHAGEAL ECHOCARDIOGRAM (TEE);  Surgeon: Thayer Headings, MD;  Location: [place];  Service: Cardiovascular;  Laterality: N/A;     Home Medications:  Prior to Admission medications   Medication Sig Start Date End Date Taking? Authorizing Provider  amLODipine (NORVASC) 10 MG tablet  TAKE 1 TABLET BY MOUTH EVERY DAY Patient taking differently: Take 10 mg by mouth daily.  06/14/20  Yes Dettinger, Fransisca Kaufmann, MD  ascorbic acid (VITAMIN C) 500 MG tablet Take 500 mg by mouth daily.   Yes [provider]  aspirin EC 81 MG tablet Take 81 mg by mouth daily.   Yes [provider]  atorvastatin (LIPITOR) 40 MG tablet TAKE 1 TABLET BY MOUTH EVERY DAY Patient taking differently: Take 40 mg by mouth daily.  04/22/20  Yes Dettinger, Fransisca Kaufmann, MD  calcitRIOL (ROCALTROL) 0.5 MCG  capsule Take 0.5 mcg by mouth daily. 04/09/20  Yes [provider]  Calcium Carbonate 500 MG CHEW Chew 1 tablet (500 mg total) by mouth 3 (three) times daily as needed. Patient taking differently: Chew 500 mg by mouth daily.  05/01/16  Yes Cherre Robins, PharmD  cholecalciferol (VITAMIN D3) 25 MCG (1000 UNIT) tablet Take 1,000 Units by mouth daily.   Yes [provider]  furosemide (LASIX) 80 MG tablet Take 1 tablet (80 mg total) by mouth 2 (two) times daily. 05/20/20  Yes Dettinger, Fransisca Kaufmann, MD  glucose blood test strip Use to check blood glucose once daily.  Dx:  E11.9 07/14/15  Yes Wardell Honour, MD  hydrALAZINE (APRESOLINE) 10 MG tablet Take 1 tablet (10 mg total) by mouth 3 (three) times daily. 07/12/20  Yes Dettinger, Fransisca Kaufmann, MD  insulin detemir (LEVEMIR FLEXPEN) 100 UNIT/ML FlexPen Inject 25 Units into the skin at bedtime. 06/23/20  Yes Brita Romp, NP  levothyroxine (SYNTHROID) 175 MCG tablet TAKE 1 TABLET (175 MCG TOTAL) BY MOUTH DAILY BEFORE BREAKFAST. 04/19/20  Yes Nida, Marella Chimes, MD  metoprolol tartrate (LOPRESSOR) 100 MG tablet TAKE 1 TABLET BY MOUTH TWICE A DAY Patient taking differently: Take 100 mg by mouth 2 (two) times daily.  09/03/20  Yes Dettinger, Fransisca Kaufmann, MD  nitroGLYCERIN (NITROSTAT) 0.4 MG SL tablet Place 1 tablet (0.4 mg total) under the tongue every 5 (five) minutes as needed for chest pain. 07/12/20  Yes Dettinger, Fransisca Kaufmann, MD    Inpatient Medications: Scheduled Meds: . amLODipine  10 mg Oral Daily  . ascorbic acid  500 mg Oral Daily  . aspirin EC  81 mg Oral Daily  . atorvastatin  40 mg Oral Daily  . calcitRIOL  0.5 mcg Oral Daily  . calcium carbonate  1 tablet Oral BID WC  . Chlorhexidine Gluconate Cloth  6 each Topical Daily  . cholecalciferol  1,000 Units Oral Daily  . hydrALAZINE  50 mg Oral TID  . insulin aspart  0-5 Units Subcutaneous QHS  . insulin aspart  0-9 Units Subcutaneous TID WC  . insulin detemir  25 Units  Subcutaneous QHS  . levothyroxine  175 mcg Oral QAC breakfast  . metoprolol tartrate  100 mg Oral BID  . multivitamin with minerals  1 tablet Oral Daily  . mupirocin ointment  1 application Nasal BID  . sodium chloride flush  3 mL Intravenous Q12H   Continuous Infusions: . sodium chloride    . furosemide Stopped (09/30/20 1941)  . heparin 1,200 Units/hr (10/01/20 0752)  . nitroGLYCERIN 10 mcg/min (10/01/20 0551)   PRN Meds: sodium chloride, acetaminophen **OR** acetaminophen, bisacodyl, nitroGLYCERIN, ondansetron **OR** ondansetron (ZOFRAN) IV, polyethylene glycol, sodium chloride flush, traZODone  Allergies:    Allergies  Allergen Reactions  . Zocor [Simvastatin - High Dose] Nausea Only    Unknown    Social History:   Social History   Socioeconomic  History  . Marital status: Married    Spouse name: Not on file  . Number of children: 6  . Years of education: 50  . Highest education level: 9th grade  Occupational History  . 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 Smoking status: Former Smoker    Packs/day: 0.50    Years: 30.00    Pack years: 15.00    Types: Cigarettes    Quit date: 11/05/1984    Years since quitting: 35.9  . Smokeless tobacco: Never Used  Vaping Use  . Vaping Use: Never used  Substance and Sexual Activity  . Alcohol use: No  . Drug use: No  . Sexual activity: Yes  Other Topics Concern  . Not on file  Social History Narrative  . Not on file   Social Determinants of Health   Financial Resource Strain: Low Risk   . Difficulty of Paying Living Expenses: Not hard at all  Food Insecurity: No Food Insecurity  . Worried About Charity fundraiser in the Last Year: Never true  . Ran Out of Food in the Last Year: Never true  Transportation Needs: No Transportation Needs  . Lack of Transportation (Medical): No  . Lack of Transportation (Non-Medical): No   Physical Activity: Inactive  . Days of Exercise per Week: 0 days  . Minutes of Exercise per Session: 0 min  Stress: No Stress Concern Present  . Feeling of Stress : Not at all  Social Connections: Moderately Integrated  . Frequency of Communication with Friends and Family: Once a week  . Frequency of Social Gatherings with Friends and Family: More than three times a week  . Attends Religious Services: More than 4 times per year  . Active Member of Clubs or Organizations: No  . Attends Archivist Meetings: Never  . Marital Status: Married  Human resources officer Violence: Not At Risk  . Fear of Current or Ex-Partner: No  . Emotionally Abused: No  . Physically Abused: No  . Sexually Abused: No     Family History:    Family History  Problem Relation Age of Onset  . Diabetes Mother   . Hypertension Mother   . Cancer Father   . Stroke Brother   . Alcohol abuse Brother   . Diabetes Brother   . Diabetes Sister   . Diabetes Brother 13       TYPE 1  . Kidney disease Brother 15       DIALYSIS  . Heart attack Brother   . Healthy Daughter   . Healthy Son   . Healthy Daughter   . Healthy Son   . Healthy Son   . Healthy Son       Review of Systems    General:  No chills, fever, night sweats or weight changes.  Cardiovascular:  No orthopnea, palpitations, paroxysmal nocturnal dyspnea. Positive for chest pain, dyspnea on exertion and edema.   Dermatological: No rash, lesions/masses Respiratory: No cough, Positive for dyspnea. Urologic: No hematuria, dysuria Abdominal:   No nausea, vomiting, diarrhea, bright red blood per rectum, melena, or hematemesis Neurologic:  No visual changes, wkns, changes in mental status. All other systems reviewed and are otherwise negative except as noted above.  Physical Exam/Data    Vitals:   10/01/20 0600 10/01/20 0700 10/01/20 0748 10/01/20 0800  BP: (!) 174/52 (!) 177/55  (!) 170/55  Pulse: 77 81 78 85  Resp: 19 (!) 22 (!) 24 19   Temp:   98.7 F (37.1 C)   TempSrc:   Oral   SpO2: 97% 96% 96% 94%  Weight:      Height:        Intake/Output Summary (Last 24 hours) at 10/01/2020 1016 Last data filed at 10/01/2020 0600 Gross per 24 hour  Intake 104.08 ml  Output 1800 ml  Net -1695.92 ml   Filed Weights   09/30/20 0642 09/30/20 1807 10/01/20 0500  Weight: 108.9 kg 107 kg 106.6 kg   Body mass index is 34.71 kg/m.   General: Pleasant elderly male appearing in NAD Psych: Normal affect. Neuro: Alert and oriented X 3. Moves all extremities spontaneously. HEENT: Normal  Neck: Supple without bruits. JVD elevated. Lungs:  Resp regular and unlabored, rales along bases bilaterally. Heart: RRR no s3, s4, or murmurs. Abdomen: Soft, non-tender, non-distended, BS + x 4.  Extremities: No clubbing or cyanosis. 2+ pitting edema bilaterally. DP/PT/Radials 2+ and equal bilaterally.   EKG:  The EKG was personally reviewed and demonstrates: NSR, HR 92 with incomplete LBBB.   Telemetry:  Telemetry was personally reviewed and demonstrates: Sinus tachycardia, HR in 100's to 110's. Occasional PVC's.    Labs/Studies     Relevant CV Studies:  NST: 07/2016  The left ventricular ejection fraction is mildly decreased (45-54%).  Nuclear stress EF: 52%.  There was no ST segment deviation noted during stress.  This is a low risk study.   Low risk stress nuclear study with no ischemia or infarction; EF 52 with mild global hypokinesis and mild LVE.   Echocardiogram: 09/30/2020 IMPRESSIONS    1. Left ventricular ejection fraction, by estimation, is 55 to 60%. The  left ventricle has normal function. The left ventricle has no regional  wall motion abnormalities. There is severe left ventricular hypertrophy.  Left ventricular diastolic parameters  are indeterminate. Elevated left atrial pressure.  2. Right ventricular systolic function is normal. The right ventricular  size is normal.  3. A small pericardial  effusion is present. The pericardial effusion is  circumferential. There is no evidence of cardiac tamponade. Large pleural  effusion in the left lateral region.  4. The mitral valve is normal in structure. Trivial mitral valve  regurgitation. No evidence of mitral stenosis.  5. The aortic valve was not well visualized. There is mild calcification  of the aortic valve. There is mild thickening of the aortic valve. Aortic  valve regurgitation is moderate. Moderate aortic valve stenosis.  6. The inferior vena cava is normal in size with greater than 50%  respiratory variability, suggesting right atrial pressure of 3 mmHg.   Laboratory Data:  Chemistry Recent Labs  Lab 09/28/20 1122 09/30/20 0654 10/01/20 0417  NA 142 142 141  K 4.4 4.4 4.3  CL 112* 113* 110  CO2 19* 18* 20*  GLUCOSE 108* 141* 106*  BUN 62* 66* 67*  CREATININE 5.79* 5.56* 5.73*  CALCIUM 8.0* 8.2* 7.9*  GFRNONAA 9* 10* 9*  ANIONGAP 11 11 11     Recent Labs  Lab 09/30/20 0654  PROT 7.7  ALBUMIN 3.4*  AST 15  ALT 16  ALKPHOS 58  BILITOT 0.5   Hematology Recent Labs  Lab 09/28/20 1122 09/30/20 0654 10/01/20 0417  WBC 4.9 7.7 4.9  RBC 3.13* 3.21* 2.86*  HGB 9.0* 9.6* 8.5*  HCT 27.7* 29.8* 26.4*  MCV 88.5 92.8 92.3  MCH 28.8 29.9 29.7  MCHC 32.5  32.2 32.2  RDW 14.3 14.3 14.2  PLT 189 194 169   Cardiac EnzymesNo results for input(s): TROPONINI in the last 168 hours. No results for input(s): TROPIPOC in the last 168 hours.  BNP Recent Labs  Lab 09/30/20 0654  BNP 1,022.0*    DDimer No results for input(s): DDIMER in the last 168 hours.  Radiology/Studies:  DG Chest Portable 1 View  Result Date: 09/30/2020 CLINICAL DATA:  Status post thoracentesis. EXAM: PORTABLE CHEST 1 VIEW COMPARISON:  Earlier today at 6:49 a.m. FINDINGS: 11:19 a.m. Midline trachea. Mild cardiomegaly. Atherosclerosis in the transverse aorta. Decrease in small left pleural effusion. Probable tiny layering right pleural  effusion. No pneumothorax. Mild congestive heart failure, improved. Persistent right and improved left base airspace disease. IMPRESSION: Decreased sided pleural effusion, without pneumothorax. Improved mild interstitial edema with left greater than right small bilateral pleural effusions and adjacent Airspace disease, likely atelectasis. Aortic Atherosclerosis (ICD10-I70.0). Electronically Signed   By: Abigail Miyamoto M.D.   On: 09/30/2020 11:29   DG Chest Portable 1 View  Result Date: 09/30/2020 CLINICAL DATA:  Chest pain and shortness of breath EXAM: PORTABLE CHEST 1 VIEW COMPARISON:  11/30/2019 FINDINGS: Bilateral pleural effusion, large appearing on the left. Interstitial and hazy airspace density on both sides with cephalized blood flow. No pneumothorax. Partially obscured heart. IMPRESSION: CHF pattern with large left and small right pleural effusion. Electronically Signed   By: Monte Fantasia M.D.   On: 09/30/2020 07:16    US THORACENTESIS ASP PLEURAL SPACE W/IMG GUIDE  Result Date: 09/30/2020 INDICATION: Left pleural effusion CHF EXAM: ULTRASOUND GUIDED LEFT THORACENTESIS MEDICATIONS: 10 cc 1% lidocaine. COMPLICATIONS: None immediate. PROCEDURE: An ultrasound guided thoracentesis was thoroughly discussed with the patient and questions answered. The benefits, risks, alternatives and complications were also discussed. The patient understands and wishes to proceed with the procedure. Written consent was obtained. Ultrasound was performed to localize and mark an adequate pocket of fluid in the left chest. The area was then prepped and draped in the normal sterile fashion. 1% Lidocaine was used for local anesthesia. Under ultrasound guidance a 19 G Yueh catheter was introduced. Thoracentesis was performed. The catheter was removed and a dressing applied. FINDINGS: A total of approximately 1 liter of blood tinged fluid was removed. Samples were sent to the laboratory as requested by the clinical team.  IMPRESSION: Successful ultrasound guided left thoracentesis yielding 1 liter of pleural fluid. No PTX post procedure CXR Read by [facility] Draft[facility] Electronically Signed   By: Abigail Miyamoto M.D.   On: 09/30/2020 11:30     Assessment & Plan    1. NSTEMI/Known CAD - He is s/p prior MI at [facility] in 2000 but records are not available but per his report he did not require stent placement at that time. He did have a low-risk NST in 2017 but actually reports progressive chest pain with exertion for the past 2-3 years which improves with rest or the use of SL NTG. - HS Troponin values this admission have been elevated at 79, 194 and 2776. EKG shows NSR, HR 92 with incomplete LBBB. Echo shows a preserved EF of 55-60% with severe LVH, normal RV function, a small pericardial effusion, trivial MR and moderate AI.  - Reviewed with the patient today and would ultimately plan for a cardiac catheterization once temporary dialysis access has been established. Continue Heparin, IV NTG, Atorvastatin and Lopressor.   2. Acute on Chronic Diastolic CHF Exacerbation - BNP elevated to 1022 and  CXR consistent with CHF. Underwent thoracentesis on admission with 1L of fluid removed. Repeat echo shows a preserved EF of 55-60% with severe LVH and normal RV function. Diuretic dosing has been deferred to Nephrology in the setting of Stage 5 CKD. Currently receiving IV Lasix 120mg  BID with a recorded output of -1.8L.   3. Aortic Regurgitation - Moderate by repeat echo this admission. Will continue to follow.   4. Accelerated HTN - BP has been elevated at 143/46 - 241/112 within the past 24 hours, at 170/55 on most recent check. Remains on IV NTG which can be further titrated. COntinue Amlodipine, Hydralazine and Lopressor. Pending trend, could switch Lopressor to Coreg for improved BP effect.   5. HLD - Followed by PCP. He has been started on Atorvastatin 40mg  daily.     For questions or updates, please contact [place] Please consult [email or web address] for contact info under Cardiology/STEMI.  Signed, Erma Heritage, PA-C 10/01/2020, 10:16 AM Pager: [phone]  Attending Note  Patient seen and discussed with PA Ahmed Prima, I agree with her documetation. 79 yo male history of CKD 5, HTN, DM2, HL reported admitted with chest pain and SOB. In ER hypoxic and placed on bipap, found to be hypertensive and started on NG drip. Significant signs of fluid overload, seen by nephrology with plans to start HD as inpatient once access is placed. Left thoracentesis with 1 L removed, started on IV diuretics by neprhology. Initital troponin was mild, hoiwever has trended up to 2891. EKG SR Echo showed LVEF 55-60%, severe LVH. EKG incomplete LBBB that is chronic.  Initially on presentation with fluid overload and woresning renal failure thought mild trop at that time was related to HF. WIth significant uptrend as well as long history of exertional chest pain concern for undelrying ischemic heart disease. He is on medical therapy with ASA 81, atorva 40, hep gtt, lopressor 100mg  bid. No ACE/ARB given renal function though could consider once committted to HD. Once access placed and able to had HD this admission would plan for heart cath.  Acute on chronic diastolic HF in setting of advanced kidney failure. We have deferred diuretic dosing to neprhology, currnetly on IV lasix 120mg  bid. Ultimately volume will be controlled by upcoming HD   Severe HTN in setting of fluid overload, per renal want to avoid aggressive bp lowering to allow optimal perfusion of kidneys. Fairly low dose NG gtt, will ask nursing to titrate more so based on SBP than chest pain, goal SBP <140  Would anticipate cath once stabilized on HD and more euvolemic, potentially Monday.    Tacy Learn MD

## 2020-10-01 NOTE — Progress Notes (Signed)
ANTICOAGULATION CONSULT NOTE - Initial Consult  Pharmacy Consult for heparin Indication: chest pain/ACS  Allergies  Allergen Reactions  . Zocor [Simvastatin - High Dose] Nausea Only    Unknown    Patient Measurements: Height: 5\' 9"  (175.3 cm) Weight: 105.1 kg (231 lb 11.3 oz) IBW/kg (Calculated) : 70.7 Heparin Dosing Weight: 94  Vital Signs: Temp: 98.4 F (36.9 C) (11/19 1430) Temp Source: Oral (11/19 1430) BP: 147/68 (11/19 1800) Pulse Rate: 95 (11/19 1800)  Labs: Recent Labs    09/30/20 0654 09/30/20 0654 09/30/20 0854 10/01/20 0417 10/01/20 0458 10/01/20 0911 10/01/20 1811  HGB 9.6*  --   --  8.5*  --   --   --   HCT 29.8*  --   --  26.4*  --   --   --   PLT 194  --   --  169  --   --   --   LABPROT 12.9  --   --   --   --   --   --   INR 1.0  --   --   --   --   --   --   HEPARINUNFRC  --   --   --   --   --   --  0.72*  CREATININE 5.56*  --   --  5.73*  --   --   --   TROPONINIHS 79*   < > 194*  --  2,776* 2,891*  --    < > = values in this interval not displayed.    Estimated Creatinine Clearance: 12.5 mL/min (A) (by C-G formula based on SCr of 5.73 mg/dL (H)).  Assessment: heparin in patient with ACS/NSTEMI.  Patient troponin elevated at 2776. Patient Is not on anticoagulation prior to admission (has been on heparin subq inpatient).  Initial hep lvl high 0.72  Goal of Therapy:  Heparin level 0.3-0.7 units/ml Monitor platelets by anticoagulation protocol: Yes   Plan:  Reduce heparin to 1150 units/hr Recheck in am  Barth Kirks, PharmD, BCPS, BCCCP Clinical Pharmacist [phone]  Please check AMION for all [place] numbers  10/01/2020 7:23 PM

## 2020-10-02 DIAGNOSIS — I10 Essential (primary) hypertension: Secondary | ICD-10-CM | POA: Diagnosis not present

## 2020-10-02 DIAGNOSIS — I5033 Acute on chronic diastolic (congestive) heart failure: Secondary | ICD-10-CM | POA: Diagnosis not present

## 2020-10-02 DIAGNOSIS — I2510 Atherosclerotic heart disease of native coronary artery without angina pectoris: Secondary | ICD-10-CM | POA: Diagnosis not present

## 2020-10-02 DIAGNOSIS — N186 End stage renal disease: Secondary | ICD-10-CM | POA: Diagnosis not present

## 2020-10-02 DIAGNOSIS — I214 Non-ST elevation (NSTEMI) myocardial infarction: Secondary | ICD-10-CM

## 2020-10-02 DIAGNOSIS — J9601 Acute respiratory failure with hypoxia: Secondary | ICD-10-CM | POA: Diagnosis not present

## 2020-10-02 LAB — IRON AND TIBC
Iron: 19 ug/dL — ABNORMAL LOW (ref 45–182)
Saturation Ratios: 11 % — ABNORMAL LOW (ref 17.9–39.5)
TIBC: 171 ug/dL — ABNORMAL LOW (ref 250–450)
UIBC: 152 ug/dL

## 2020-10-02 LAB — CBC
HCT: 25.3 % — ABNORMAL LOW (ref 39.0–52.0)
HCT: 21.5 % — ABNORMAL LOW (ref 39.0–52.0)
Hemoglobin: 8.3 g/dL — ABNORMAL LOW (ref 13.0–17.0)
Hemoglobin: 7 g/dL — ABNORMAL LOW (ref 13.0–17.0)
MCH: 29.6 pg (ref 26.0–34.0)
MCH: 29 pg (ref 26.0–34.0)
MCHC: 32.8 g/dL (ref 30.0–36.0)
MCHC: 32.6 g/dL (ref 30.0–36.0)
MCV: 90.4 fL (ref 80.0–100.0)
MCV: 89.2 fL (ref 80.0–100.0)
Platelets: 163 10*3/uL (ref 150–400)
Platelets: 131 10*3/uL — ABNORMAL LOW (ref 150–400)
RBC: 2.8 MIL/uL — ABNORMAL LOW (ref 4.22–5.81)
RBC: 2.41 MIL/uL — ABNORMAL LOW (ref 4.22–5.81)
RDW: 14.3 % (ref 11.5–15.5)
RDW: 14.1 % (ref 11.5–15.5)
WBC: 6.3 10*3/uL (ref 4.0–10.5)
WBC: 5 10*3/uL (ref 4.0–10.5)
nRBC: 0 % (ref 0.0–0.2)
nRBC: 0 % (ref 0.0–0.2)

## 2020-10-02 LAB — RENAL FUNCTION PANEL
Albumin: 2.1 g/dL — ABNORMAL LOW (ref 3.5–5.0)
Anion gap: 10 (ref 5–15)
BUN: 47 mg/dL — ABNORMAL HIGH (ref 8–23)
CO2: 22 mmol/L (ref 22–32)
Calcium: 7.3 mg/dL — ABNORMAL LOW (ref 8.9–10.3)
Chloride: 104 mmol/L (ref 98–111)
Creatinine, Ser: 4.57 mg/dL — ABNORMAL HIGH (ref 0.61–1.24)
GFR, Estimated: 12 mL/min — ABNORMAL LOW (ref >60)
Glucose, Bld: 177 mg/dL — ABNORMAL HIGH (ref 70–99)
Phosphorus: 4.1 mg/dL (ref 2.5–4.6)
Potassium: 3.8 mmol/L (ref 3.5–5.1)
Sodium: 136 mmol/L (ref 135–145)

## 2020-10-02 LAB — GLUCOSE, CAPILLARY
Glucose-Capillary: 101 mg/dL — ABNORMAL HIGH (ref 70–99)
Glucose-Capillary: 108 mg/dL — ABNORMAL HIGH (ref 70–99)
Glucose-Capillary: 143 mg/dL — ABNORMAL HIGH (ref 70–99)
Glucose-Capillary: 204 mg/dL — ABNORMAL HIGH (ref 70–99)

## 2020-10-02 LAB — BLOOD GAS, VENOUS
Acid-Base Excess: 1.3 mmol/L (ref 0.0–2.0)
Bicarbonate: 25.3 mmol/L (ref 20.0–28.0)
FIO2: 52
O2 Saturation: 89.1 %
Patient temperature: 37
pCO2, Ven: 39.3 mmHg — ABNORMAL LOW (ref 44.0–60.0)
pH, Ven: 7.424 (ref 7.250–7.430)
pO2, Ven: 60.3 mmHg — ABNORMAL HIGH (ref 32.0–45.0)

## 2020-10-02 LAB — HEPARIN LEVEL (UNFRACTIONATED): Heparin Unfractionated: 0.47 IU/mL (ref 0.30–0.70)

## 2020-10-02 LAB — LIPID PANEL
Cholesterol: 147 mg/dL (ref 0–200)
HDL: 39 mg/dL — ABNORMAL LOW (ref >40)
LDL Cholesterol: 93 mg/dL (ref 0–99)
Total CHOL/HDL Ratio: 3.8 RATIO
Triglycerides: 74 mg/dL (ref <150)
VLDL: 15 mg/dL (ref 0–40)

## 2020-10-02 LAB — TSH: TSH: 1.085 u[IU]/mL (ref 0.350–4.500)

## 2020-10-02 LAB — T4, FREE: Free T4: 1.19 ng/dL — ABNORMAL HIGH (ref 0.61–1.12)

## 2020-10-02 MED ORDER — SODIUM CHLORIDE 0.9 % IV SOLN: 100.0000 mL | INTRAVENOUS | Status: DC | PRN

## 2020-10-02 MED ORDER — SODIUM CHLORIDE 0.9% FLUSH
3.0000 mL | Freq: Two times a day (BID) | INTRAVENOUS | Status: DC
  Administered 2020-10-02 – 2020-10-12 (×13): 3 mL via INTRAVENOUS

## 2020-10-02 MED ORDER — SODIUM CHLORIDE 0.9 % IV SOLN: INTRAVENOUS | Status: DC

## 2020-10-02 MED ORDER — CHLORHEXIDINE GLUCONATE CLOTH 2 % EX PADS
6.0000 | MEDICATED_PAD | Freq: Every day | CUTANEOUS | Status: DC
  Administered 2020-10-02 – 2020-10-04 (×3): 6 via TOPICAL

## 2020-10-02 MED ORDER — SODIUM CHLORIDE 0.9% FLUSH: 3.0000 mL | INTRAVENOUS | Status: DC | PRN

## 2020-10-02 MED ORDER — HEPARIN SODIUM (PORCINE) 1000 UNIT/ML DIALYSIS: 1000.0000 [IU] | INTRAMUSCULAR | Status: DC | PRN

## 2020-10-02 MED ORDER — PENTAFLUOROPROP-TETRAFLUOROETH EX AERO
1.0000 "application " | INHALATION_SPRAY | CUTANEOUS | Status: DC | PRN
  Filled 2020-10-02: qty 116

## 2020-10-02 MED ORDER — HEPARIN SODIUM (PORCINE) 1000 UNIT/ML IJ SOLN
INTRAMUSCULAR | Status: AC
  Filled 2020-10-02: qty 4

## 2020-10-02 MED ORDER — ALTEPLASE 2 MG IJ SOLR: 2.0000 mg | Freq: Once | INTRAMUSCULAR | Status: DC | PRN

## 2020-10-02 MED ORDER — LIDOCAINE HCL (PF) 1 % IJ SOLN: 5.0000 mL | INTRAMUSCULAR | Status: DC | PRN

## 2020-10-02 MED ORDER — ATORVASTATIN CALCIUM 80 MG PO TABS
80.0000 mg | ORAL_TABLET | Freq: Every day | ORAL | Status: DC
  Administered 2020-10-02 – 2020-10-13 (×11): 80 mg via ORAL
  Filled 2020-10-02 (×11): qty 1

## 2020-10-02 MED ORDER — SODIUM CHLORIDE 0.9 % IV SOLN: 250.0000 mL | INTRAVENOUS | Status: DC | PRN

## 2020-10-02 MED ORDER — ASPIRIN 81 MG PO CHEW
81.0000 mg | CHEWABLE_TABLET | ORAL | Status: AC
  Administered 2020-10-04: 81 mg via ORAL
  Filled 2020-10-02: qty 1

## 2020-10-02 MED ORDER — LIDOCAINE-PRILOCAINE 2.5-2.5 % EX CREA: 1.0000 "application " | TOPICAL_CREAM | CUTANEOUS | Status: DC | PRN

## 2020-10-02 NOTE — Progress Notes (Signed)
[place] for heparin Indication: chest pain/ACS  Allergies  Allergen Reactions  . Zocor [Simvastatin - High Dose] Nausea Only    Unknown    Patient Measurements: Height: 5\' 9"  (175.3 cm) Weight: 106.3 kg (234 lb 5.6 oz) IBW/kg (Calculated) : 70.7 Heparin Dosing Weight: 94  Vital Signs: Temp: 98.7 F (37.1 C) (11/20 1150) Temp Source: Oral (11/20 1150) BP: 152/63 (11/20 1150) Pulse Rate: 97 (11/20 1150)  Labs: Recent Labs    09/30/20 0654 09/30/20 0654 09/30/20 0854 10/01/20 0417 10/01/20 0417 10/01/20 0458 10/01/20 0911 10/01/20 1811 10/01/20 1932 10/01/20 1932 10/02/20 0340 10/02/20 1331  HGB 9.6*   < >  --  8.5*   < >  --   --   --  8.1*   < > 8.3* 7.0*  HCT 29.8*   < >  --  26.4*   < >  --   --   --  25.4*  --  25.3* 21.5*  PLT 194   < >  --  169   < >  --   --   --  162  --  163 131*  LABPROT 12.9  --   --   --   --   --   --   --   --   --   --   --   INR 1.0  --   --   --   --   --   --   --   --   --   --   --   HEPARINUNFRC  --   --   --   --   --   --   --  0.72*  --   --  0.47  --   CREATININE 5.56*  --   --  5.73*  --   --   --   --  5.96*  --   --   --   TROPONINIHS 79*   < > 194*  --   --  2,776* 2,891*  --   --   --   --   --    < > = values in this interval not displayed.    Estimated Creatinine Clearance: 12.1 mL/min (A) (by C-G formula based on SCr of 5.96 mg/dL (H)).  Assessment: Heparin in patient with ACS/NSTEMI.  Patient troponin elevated at 2776. Patient Is not on anticoagulation prior to admission (has been on heparin subq inpatient).  Heparin level came back therapeutic at 0.47, on 1150 units/hr. Hgb 7, plt 131. No s/sx of bleeding or infusion issues.   Goal of Therapy:  Heparin level 0.3-0.7 units/ml Monitor platelets by anticoagulation protocol: Yes   Plan:  Continue heparin infusion at 1150 units/hr Monitor daily HL, CBC, and for s/sx of bleeding  Antonietta Jewel, PharmD, [place]  Pharmacist  Phone: [phone] 10/02/2020 1:55 PM  Please check AMION for all [place] phone numbers After 10:00 PM, call [place] [phone]

## 2020-10-02 NOTE — Plan of Care (Signed)

## 2020-10-02 NOTE — Progress Notes (Signed)
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  Transferred to [facility] 10/01/2020 for IR to place hemodialysis access.  He will need vein and vascular surgery to evaluate after weekend for conversion to a permanent dialysis catheter as well as AV fistula.  He will also need renal navigator to become involved.  He is now end-stage renal disease.  His first dialysis treatment 10/01/2020 1 L removed.  Blood pressure 146/68 pulse 89 temperature 98.6 O2 sats 100% 4 L high flow nasal cannula  Sodium 131 potassium 4.4 chloride 110 CO2 18 BUN of 71 creatinine 5.96 glucose 137 phosphorus 4.9 albumin 2.4 hemoglobin 8.3  Objective:  Vital signs in last 24 hours:  Temp:  [98 F (36.7 C)-98.7 F (37.1 C)] 98.6 F (37 C) (11/20 0731) Pulse Rate:  [73-100] 89 (11/20 0731) Resp:  [14-31] 21 (11/20 0731) BP: (136-191)/(43-93) 146/68 (11/20 0731) SpO2:  [91 %-98 %] 94 % (11/20 0731) Weight:  [105.1 kg-106.3 kg] 106.3 kg (11/20 0500)  Weight change: -1.9 kg Filed Weights   10/01/20 0500 10/01/20 1430 10/02/20 0500  Weight: 106.6 kg 105.1 kg 106.3 kg    Intake/Output: I/O last 3 completed shifts: In: 891.3 [P.O.:357; I.V.:411.6; IV Piggyback:122.8] Out: 1638 [Urine:3650; Other:1000]   Intake/Output this shift:  No intake/output data recorded.  Awake alert nondistressed using additional oxygen CVS- RRR no murmurs rubs gallops RS- CTA no wheeze rales ABD- BS present soft non-distended EXT-2-3+ pitting edema lower extremities   Basic Metabolic Panel: Recent Labs  Lab 09/28/20 1122 09/28/20 1122 09/30/20 0654 10/01/20 0417 10/01/20 1932  NA 142  --  142 141 141  K 4.4  --  4.4 4.3 4.4  CL 112*  --  113* 110 110  CO2 19*  --  18* 20* 18*   GLUCOSE 108*  --  141* 106* 137*  BUN 62*  --  66* 67* 71*  CREATININE 5.79*  --  5.56* 5.73* 5.96*  CALCIUM 8.0*   < > 8.2* 7.9* 7.9*  PHOS  --   --   --   --  4.9*   < > = values in this interval not displayed.    Liver Function Tests: Recent Labs  Lab 09/30/20 0654 10/01/20 1932  AST 15  --   ALT 16  --   ALKPHOS 58  --   BILITOT 0.5  --   PROT 7.7  --   ALBUMIN 3.4* 2.4*   No results for input(s): LIPASE, AMYLASE in the last 168 hours. No results for input(s): AMMONIA in the last 168 hours.  CBC: Recent Labs  Lab 09/28/20 1122 09/30/20 0654 10/01/20 0417 10/01/20 1932 10/02/20 0340  WBC 4.9 7.7 4.9 7.1 6.3  NEUTROABS 2.6 4.1  --   --   --   HGB 9.0* 9.6* 8.5* 8.1* 8.3*  HCT 27.7* 29.8* 26.4* 25.4* 25.3*  MCV 88.5 92.8 92.3 92.4 90.4  PLT 189 194 169 162 163    Cardiac Enzymes: No results for input(s): CKTOTAL, CKMB, CKMBINDEX, TROPONINI in the last 168 hours.  BNP: Invalid input(s): POCBNP  CBG: Recent Labs  Lab 10/01/20 0751 10/01/20 1136 10/01/20 1627 10/02/20 0046 10/02/20 0617  GLUCAP 89 161* 99 101* 108*    Microbiology: Results for orders placed or performed during the hospital encounter of 09/30/20  Resp Panel by RT-PCR (Flu A&B, Covid)     Status: None   Collection Time: 09/30/20  6:54 AM  Result Value Ref Range Status   SARS Coronavirus 2 by RT PCR NEGATIVE NEGATIVE Final    Comment: (NOTE) SARS-CoV-2 target nucleic acids are NOT DETECTED.  The SARS-CoV-2 RNA is generally detectable in upper respiratory specimens during the acute phase of infection. The lowest concentration of SARS-CoV-2 viral copies this assay can detect is 138 copies/mL. A negative result does not preclude SARS-Cov-2 infection and should not be used as the sole basis for treatment or other patient management decisions. A negative result may occur with  improper specimen collection/handling, submission of specimen other than nasopharyngeal swab, presence of viral  mutation(s) within the areas targeted by this assay, and inadequate number of viral copies(<138 copies/mL). A negative result must be combined with clinical observations, patient history, and epidemiological information. The expected result is Negative.  Fact Sheet for Patients:  EntrepreneurPulse.com.au  Fact Sheet for Healthcare Providers:  IncredibleEmployment.be  This test is no t yet approved or cleared by the [place] FDA and  has been authorized for detection and/or diagnosis of SARS-CoV-2 by FDA under an Emergency Use Authorization (EUA). This EUA will remain  in effect (meaning this test can be used) for the duration of the COVID-19 declaration under Section 564(b)(1) of the Act, 21 U.S.C.section 360bbb-3(b)(1), unless the authorization is terminated  or revoked sooner.       Influenza A by PCR NEGATIVE NEGATIVE Final   Influenza B by PCR NEGATIVE NEGATIVE Final    Comment: (NOTE) The Xpert Xpress SARS-CoV-2/FLU/RSV plus assay is intended as an aid in the diagnosis of influenza from Nasopharyngeal swab specimens and should not be used as a sole basis for treatment. Nasal washings and aspirates are unacceptable for Xpert Xpress SARS-CoV-2/FLU/RSV testing.  Fact Sheet for Patients: EntrepreneurPulse.com.au  Fact Sheet for Healthcare Providers: IncredibleEmployment.be  This test is not yet approved or cleared by the [place] FDA and has been authorized for detection and/or diagnosis of SARS-CoV-2 by FDA under an Emergency Use Authorization (EUA). This EUA will remain in effect (meaning this test can be used) for the duration of the COVID-19 declaration under Section 564(b)(1) of the Act, 21 U.S.C. section 360bbb-3(b)(1), unless the authorization is terminated or revoked.  Performed at [facility], [address], [place], [place] [address]   Culture, body fluid-bottle     Status: None  (Preliminary result)   Collection Time: 09/30/20 11:26 AM   Specimen: Ascitic  Result Value Ref Range Status   Specimen Description ASCITIC  Final   Special Requests 10CC  Final   Culture   Final    NO GROWTH 2 DAYS Performed at [facility], [address], [place], [place] [address]    Report Status PENDING  Incomplete  Gram stain     Status: None   Collection Time: 09/30/20 11:26 AM   Specimen: Ascitic  Result Value Ref Range Status   Specimen Description ASCITIC  Final   Special Requests NONE  Final   Gram Stain   Final    SPECIMEN CLOTTED UNABLE TO PERFORM MATTHEWS, B 11.18.2021  Performed at [facility], [address] [place], [place] [address]    Report Status 09/30/2020 FINAL  Final  MRSA PCR Screening     Status: Abnormal   Collection Time: 09/30/20  1:58 PM   Specimen: Nasal Mucosa; Nasopharyngeal  Result Value Ref Range Status   MRSA by PCR  POSITIVE (A) NEGATIVE Final    Comment:        The GeneXpert MRSA Assay (FDA approved for NASAL specimens only), is one component of a comprehensive MRSA colonization surveillance program. It is not intended to diagnose MRSA infection nor to guide or monitor treatment for MRSA infections. RESULT CALLED TO, READ BACK BY AND VERIFIED WITH: H EVANS,RN@0137  10/01/20 MKELLY Performed at [facility], [address], [place], [place] [address]     Coagulation Studies: Recent Labs    09/30/20 0654  LABPROT 12.9  INR 1.0    Urinalysis: No results for input(s): COLORURINE, LABSPEC, PHURINE, GLUCOSEU, HGBUR, BILIRUBINUR, KETONESUR, PROTEINUR, UROBILINOGEN, NITRITE, LEUKOCYTESUR in the last 72 hours.  Invalid input(s): APPERANCEUR    Imaging: IR Fluoro Guide CV Line Right  Result Date: 10/01/2020 INDICATION: END-STAGE RENAL DISEASE, NO CURRENT ACCESS FOR DIALYSIS EXAM: ULTRASOUND FLUOROSCOPIC RIGHT IJ TEMPORARY DIALYSIS CATHETER [facility] CATHETER) MEDICATIONS: 1% lidocaine local ANESTHESIA/SEDATION: None. FLUOROSCOPY  TIME:  Fluoroscopy Time: 0 minutes 36 seconds (29.8 mGy). COMPLICATIONS: None immediate. PROCEDURE: Informed written consent was obtained from the patient after a thorough discussion of the procedural risks, benefits and alternatives. All questions were addressed. Maximal Sterile Barrier Technique was utilized including caps, mask, sterile gowns, sterile gloves, sterile drape, hand hygiene and skin antiseptic. A timeout was performed prior to the initiation of the procedure. Under sterile conditions and local anesthesia, ultrasound micropuncture access performed of the right internal jugular vein. Images obtained for documentation of the patent right internal jugular vein. Micro dilator advanced. Amplatz guidewire inserted. Tract dilatation performed to insert a 20 cm her catheter. Tip position SVC RA junction. Images obtained for documentation. Access secured with prolene sutures. Blood aspirated easily followed by saline and heparin flushes. Appropriate volume and strength of heparin instilled in all 3 lumens followed by external caps and a sterile dressing. No immediate complication. Patient tolerated the procedure well. IMPRESSION: Successful ultrasound and fluoroscopic right IJ temporary dialysis catheter (Mahurkar catheter). Access ready for use. Electronically Signed   By: Jerilynn Mages.  Shick M.D.   On: 10/01/2020 16:04   IR US Guide Vasc Access Right  Result Date: 10/01/2020 INDICATION: END-STAGE RENAL DISEASE, NO CURRENT ACCESS FOR DIALYSIS EXAM: ULTRASOUND FLUOROSCOPIC RIGHT IJ TEMPORARY DIALYSIS CATHETER [facility] CATHETER) MEDICATIONS: 1% lidocaine local ANESTHESIA/SEDATION: None. FLUOROSCOPY TIME:  Fluoroscopy Time: 0 minutes 36 seconds (29.8 mGy). COMPLICATIONS: None immediate. PROCEDURE: Informed written consent was obtained from the patient after a thorough discussion of the procedural risks, benefits and alternatives. All questions were addressed. Maximal Sterile Barrier Technique was utilized including  caps, mask, sterile gowns, sterile gloves, sterile drape, hand hygiene and skin antiseptic. A timeout was performed prior to the initiation of the procedure. Under sterile conditions and local anesthesia, ultrasound micropuncture access performed of the right internal jugular vein. Images obtained for documentation of the patent right internal jugular vein. Micro dilator advanced. Amplatz guidewire inserted. Tract dilatation performed to insert a 20 cm her catheter. Tip position SVC RA junction. Images obtained for documentation. Access secured with prolene sutures. Blood aspirated easily followed by saline and heparin flushes. Appropriate volume and strength of heparin instilled in all 3 lumens followed by external caps and a sterile dressing. No immediate complication. Patient tolerated the procedure well. IMPRESSION: Successful ultrasound and fluoroscopic right IJ temporary dialysis catheter (Mahurkar catheter). Access ready for use. Electronically Signed   By: Jerilynn Mages.  Shick M.D.   On: 10/01/2020 16:04   DG Chest Portable 1 View  Result Date: 09/30/2020 CLINICAL DATA:  Status  post thoracentesis. EXAM: PORTABLE CHEST 1 VIEW COMPARISON:  Earlier today at 6:49 a.m. FINDINGS: 11:19 a.m. Midline trachea. Mild cardiomegaly. Atherosclerosis in the transverse aorta. Decrease in small left pleural effusion. Probable tiny layering right pleural effusion. No pneumothorax. Mild congestive heart failure, improved. Persistent right and improved left base airspace disease. IMPRESSION: Decreased sided pleural effusion, without pneumothorax. Improved mild interstitial edema with left greater than right small bilateral pleural effusions and adjacent Airspace disease, likely atelectasis. Aortic Atherosclerosis (ICD10-I70.0). Electronically Signed   By: Abigail Miyamoto M.D.   On: 09/30/2020 11:29   ECHOCARDIOGRAM COMPLETE  Result Date: 09/30/2020    ECHOCARDIOGRAM REPORT   Patient Name:   Michael Trevino Date of Exam: 09/30/2020  Medical Rec #:  [id number]    Height:       69.0 in Accession #:    [id number]   Weight:       240.0 lb Date of Birth:  1941-05-31    BSA:          2.232 m Patient Age:    38 years     BP:           167/58 mmHg Patient Gender: M            HR:           74 bpm. Exam Location:  [facility] Procedure: 2D Echo Indications:     Dyspnea 786.09 / R06.00  History:         Patient has prior history of Echocardiogram examinations, most                  recent 09/24/2019. CHF; Risk Factors:Former Smoker,                  Dyslipidemia, Hypertension and Diabetes. Nonrheumatic aortic                  valve insufficiency.  Sonographer:     Leavy Cella RDCS (AE) Referring Phys:  [id number] Arnoldo Lenis Diagnosing Phys: Carlyle Dolly MD IMPRESSIONS  1. Left ventricular ejection fraction, by estimation, is 55 to 60%. The left ventricle has normal function. The left ventricle has no regional wall motion abnormalities. There is severe left ventricular hypertrophy. Left ventricular diastolic parameters  are indeterminate. Elevated left atrial pressure.  2. Right ventricular systolic function is normal. The right ventricular size is normal.  3. A small pericardial effusion is present. The pericardial effusion is circumferential. There is no evidence of cardiac tamponade. Large pleural effusion in the left lateral region.  4. The mitral valve is normal in structure. Trivial mitral valve regurgitation. No evidence of mitral stenosis.  5. The aortic valve was not well visualized. There is mild calcification of the aortic valve. There is mild thickening of the aortic valve. Aortic valve regurgitation is moderate. Moderate aortic valve stenosis.  6. The inferior vena cava is normal in size with greater than 50% respiratory variability, suggesting right atrial pressure of 3 mmHg. FINDINGS  Left Ventricle: Left ventricular ejection fraction, by estimation, is 55 to 60%. The left ventricle has normal function. The left ventricle has no  regional wall motion abnormalities. The left ventricular internal cavity size was normal in size. There is  severe left ventricular hypertrophy. Left ventricular diastolic parameters are indeterminate. Elevated left atrial pressure. Right Ventricle: The right ventricular size is normal. No increase in right ventricular wall thickness. Right ventricular systolic function is normal. Left Atrium: Left atrial size was normal in size. Right Atrium:  Right atrial size was normal in size. Pericardium: A small pericardial effusion is present. The pericardial effusion is circumferential. There is no evidence of cardiac tamponade. Mitral Valve: The mitral valve is normal in structure. There is mild thickening of the mitral valve leaflet(s). There is mild calcification of the mitral valve leaflet(s). Mild mitral annular calcification. Trivial mitral valve regurgitation. No evidence  of mitral valve stenosis. Tricuspid Valve: The tricuspid valve is not well visualized. Tricuspid valve regurgitation is mild . No evidence of tricuspid stenosis. Aortic Valve: The aortic valve was not well visualized. There is mild calcification of the aortic valve. There is mild thickening of the aortic valve. There is mild aortic valve annular calcification. Aortic valve regurgitation is moderate. Aortic regurgitation PHT measures 349 msec. Moderate aortic stenosis is present. Aortic valve mean gradient measures 19.4 mmHg. Aortic valve peak gradient measures 34.7 mmHg. Aortic valve area, by VTI measures 1.30 cm. Pulmonic Valve: The pulmonic valve was not well visualized. Pulmonic valve regurgitation is not visualized. No evidence of pulmonic stenosis. Aorta: The aortic root is normal in size and structure. Pulmonary Artery: Indeterminate PASP, inadequate TR jet. Venous: The inferior vena cava is normal in size with greater than 50% respiratory variability, suggesting right atrial pressure of 3 mmHg. IAS/Shunts: No atrial level shunt detected by  color flow Doppler. Additional Comments: There is a large pleural effusion in the left lateral region.  LEFT VENTRICLE PLAX 2D LVIDd:         5.34 cm  Diastology LVIDs:         2.37 cm  LV e' lateral:   3.59 cm/s LV PW:         2.03 cm  LV E/e' lateral: 34.3 LV IVS:        1.69 cm LVOT diam:     2.10 cm LV SV:         87 LV SV Index:   39 LVOT Area:     3.46 cm  RIGHT VENTRICLE RV S prime:     14.20 cm/s TAPSE (M-mode): 2.2 cm LEFT ATRIUM             Index       RIGHT ATRIUM          Index LA diam:        3.40 cm 1.52 cm/m  RA Area:     8.96 cm LA Vol (A2C):   70.3 ml 31.49 ml/m RA Volume:   17.80 ml 7.97 ml/m LA Vol (A4C):   47.8 ml 21.41 ml/m LA Biplane Vol: 58.7 ml 26.29 ml/m  AORTIC VALVE AV Area (Vmax):    1.29 cm AV Area (Vmean):   1.24 cm AV Area (VTI):     1.30 cm AV Vmax:           294.66 cm/s AV Vmean:          207.077 cm/s AV VTI:            0.669 m AV Peak Grad:      34.7 mmHg AV Mean Grad:      19.4 mmHg LVOT Vmax:         109.33 cm/s LVOT Vmean:        73.863 cm/s LVOT VTI:          0.252 m LVOT/AV VTI ratio: 0.38 AI PHT:            349 msec  AORTA Ao Root diam: 3.10 cm MITRAL VALVE MV Area (PHT): 3.23  cm     SHUNTS MV Decel Time: 235 msec     Systemic VTI:  0.25 m MV E velocity: 123.00 cm/s  Systemic Diam: 2.10 cm MV A velocity: 101.00 cm/s MV E/A ratio:  1.22 Carlyle Dolly MD Electronically signed by Carlyle Dolly MD Signature Date/Time: 09/30/2020/12:04:11 PM    Final (Updated)    US THORACENTESIS ASP PLEURAL SPACE W/IMG GUIDE  Result Date: 09/30/2020 INDICATION: Left pleural effusion CHF EXAM: ULTRASOUND GUIDED LEFT THORACENTESIS MEDICATIONS: 10 cc 1% lidocaine. COMPLICATIONS: None immediate. PROCEDURE: An ultrasound guided thoracentesis was thoroughly discussed with the patient and questions answered. The benefits, risks, alternatives and complications were also discussed. The patient understands and wishes to proceed with the procedure. Written consent was obtained. Ultrasound  was performed to localize and mark an adequate pocket of fluid in the left chest. The area was then prepped and draped in the normal sterile fashion. 1% Lidocaine was used for local anesthesia. Under ultrasound guidance a 19 G Yueh catheter was introduced. Thoracentesis was performed. The catheter was removed and a dressing applied. FINDINGS: A total of approximately 1 liter of blood tinged fluid was removed. Samples were sent to the laboratory as requested by the clinical team. IMPRESSION: Successful ultrasound guided left thoracentesis yielding 1 liter of pleural fluid. No PTX post procedure CXR Read by Lavonia Drafts [facility] Electronically Signed   By: Abigail Miyamoto M.D.   On: 09/30/2020 11:30     Medications:   . sodium chloride    . furosemide 120 mg (10/01/20 1801)  . heparin 1,150 Units/hr (10/02/20 0443)  . nitroGLYCERIN 5 mcg/min (10/02/20 0443)   . amLODipine  10 mg Oral Daily  . ascorbic acid  500 mg Oral Daily  . aspirin EC  81 mg Oral Daily  . atorvastatin  40 mg Oral Daily  . calcitRIOL  0.5 mcg Oral Daily  . calcium carbonate  1 tablet Oral BID WC  . Chlorhexidine Gluconate Cloth  6 each Topical Daily  . cholecalciferol  1,000 Units Oral Daily  . hydrALAZINE  50 mg Oral TID  . insulin aspart  0-5 Units Subcutaneous QHS  . insulin aspart  0-9 Units Subcutaneous TID WC  . insulin detemir  25 Units Subcutaneous QHS  . levothyroxine  175 mcg Oral QAC breakfast  . metoprolol tartrate  100 mg Oral BID  . multivitamin with minerals  1 tablet Oral Daily  . mupirocin ointment  1 application Nasal BID  . 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  Underwent dialysis treatment 10/01/2020 with successful treatment.  Next dialysis  scheduled for 10/02/2020.  He continues to make urine on Lasix 120 mg IV every 12 hours.  I think this can probably be discontinued.  Hypertension/volume toxic respiratory failure with large left pleural effusion status post left thoracentesis with 1 L of blood-tinged tinged fluid removed.  Chest pain transferred from urgent care for cardiology evaluation and possible cardiac catheterization  Hypertension should resolve with ultrafiltration of fluid removal.  We will continue to follow and decrease antihypertensive medications accordingly.  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  Will probably need ESA started.   LOS: 2 Sherril Croon @TODAY @8 :17 AM

## 2020-10-02 NOTE — Progress Notes (Addendum)
PROGRESS NOTE    Michael Trevino  ACZ:[id number] DOB: 1941-06-29 DOA: 09/30/2020  PCP: Dettinger, Fransisca Kaufmann, MD    Brief Narrative:   Michael Trevino  is a 79 y.o. male with past medical history relevant for HTN, DM2, CKD 5 with nephrotic syndrome,, hypothyroidism, CAD with prior MI, and HLD and obesity who initially presented on 09/30/2020 to the surgical unit for creation of a left arm AV fistula by vascular surgeon Dr. Curt Trevino--- however patient complained of some chest discomfort and shortness of breath, so his procedure was canceled, and patient was sent to the ED for work-up for his shortness of breath -Patient was very short of breath and hypoxic required continuous BiPAP initially, also requiring IV nitro drip for elevated BP and CHF flareup -Patient received IV Lasix 80 mg x 2 with very limited output --Chest x-ray in the ED showed bilateral pleural effusion left more than right --Patient underwent successful ultrasound-guided thoracentesis on 09/30/2020 with removal of 1 L from the left pleural space--- studies pending -After thoracentesis patient was eventually able to come off BiPAP, and transitioned to nasal cannula --- Echo from 10/93/2355 with diastolic dysfunction No fever  Or chills  -No productive cough No nausea, vomiting, diarrhea , denies pleuritic symptoms does have leg swelling In ED hemoglobin is 9.6 which is not far from his recent baseline, WBC 7.7 and platelets 194 --Creatinine is up to 5.5, with a bicarb of 18, potassium is 4.4 and BUN is 66 -LFTs are not elevated -BNP is elevated at 1022 and chest x-ray consistent with CHF with effusions -Troponin is 194 in the setting of renal failure   Assessment & Plan:  HFpEF--patient with acute on chronic diastolic dysfunction CHF exacerbation -echo from 09/30/2020 with EF of 55 to  60%, with severe LVH, small pericardial effusion without significant valvular abnormalities and no significant wall motion  abnormalities ----Chest x-ray on admission with CHF and bilateral pleural effusion left more than right --Patient underwent successful ultrasound-guided thoracentesis on 09/30/2020 with removal of 1 L from the left pleural space--- studies pending -After thoracentesis patient was eventually able to come off BiPAP, and transitioned to nasal cannula --BNP is elevated at 1022 ,-Troponin is 194 in the setting of renal failure -Continue to dose high-dose IV Lasix per nephrology service -Patient will need to initiate hemodialysis to address his volume status. -Pt is on 4 L [place] and notes that his legs are less swollen .  Intake/Output Summary (Last 24 hours) at 10/02/2020 1346 Last data filed at 10/02/2020 1200 Gross per 24 hour  Intake 854.89 ml  Output 2850 ml  Net -1995.11 ml     Chronic anemia of CKD-- In ED hemoglobin is 9.6 which is not far from his recent baseline--no evidence of ongoing bleeding, -EPO/ESA agent per nephrology service. -Hb is 8.3 we will follow.     CKD IV/ESRD / HD-- --Creatinine is up to 5.5, with a bicarb of 18, potassium is 4.4 and BUN is 66 -Patient is clearly volume overloaded clinically and radiologically -Abdomen seizures above #1 --Timing of initiation of hemodialysis per nephrology service. -greatly appreciate consult . Lab Results  Component Value Date   CREATININE 5.96 (H) 10/01/2020   CREATININE 5.73 (H) 10/01/2020   CREATININE 5.56 (H) 09/30/2020      HTN--much improved with IV nitro, okay to transition off IV nitro to p.o. amlodipine 10 mg daily and hydralazine 50 mg 3 times daily -Use IV labetalol as needed persistent elevated BP once transitioned off IV  nitro Blood pressure (!) 152/63, pulse 97, temperature 98.7 F (37.1 C), temperature source Oral, resp. rate (!) 21, height 5\' 9"  (1.753 m), weight 106.3 kg, SpO2 95 %. prn hydralazine.  Acute hypoxic respiratory failure--- in the setting of volume overload in a patient with worsening  CKD 5, as well as acute on chronic diastolic CHF exacerbation with bilateral left more than right pleural effusions--oxygenation improved after left-sided thoracentesis with removal of 1 L -Continue supplemental oxygen, patient has been able to come off BiPAP -Anticipate improvement in oxygenation with improvement in CHF and after initiation of hemodialysis to address volume status. -SpO2: 94 % O2 Flow Rate (L/min): 4 L/min -VBG pending. -Echo results as follow: IMPRESSION: 1. Left ventricular ejection fraction, by estimation, is 55 to 60%. The  left ventricle has normal function. The left ventricle has no regional  wall motion abnormalities. There is severe left ventricular hypertrophy.  Left ventricular diastolic parameters  are indeterminate. Elevated left atrial pressure.  2. Right ventricular systolic function is normal. The right ventricular  size is normal.  3. A small pericardial effusion is present. The pericardial effusion is  circumferential. There is no evidence of cardiac tamponade. Large pleural  effusion in the left lateral region.  4. The mitral valve is normal in structure. Trivial mitral valve  regurgitation. No evidence of mitral stenosis.  5. The aortic valve was not well visualized. There is mild calcification  of the aortic valve. There is mild thickening of the aortic valve. Aortic  valve regurgitation is moderate. Moderate aortic valve stenosis.  6. The inferior vena cava is normal in size with greater than 50%  respiratory variability, suggesting right atrial pressure of 3 mmHg.  VBg shows hypoxia. Will get bipap at bedside.   CAD/HLD--continue Lipitor, aspirin, and metoprolol -troponin as above #1 -Currently chest pain-free. 10/02/20 Continue Lipitor, aspirin,metoprolol, troponin. Results for Michael Trevino (MRN [id number]) as of 10/02/2020 08:48  Ref. Range 10/02/2020 03:40  Total CHOL/HDL Ratio Latest Units: RATIO 3.8  Cholesterol Latest Ref Range:  0 - 200 mg/dL 147  HDL Cholesterol Latest Ref Range: >40 mg/dL 39 (L)  LDL (calc) Latest Ref Range: 0 - 99 mg/dL 93  Triglycerides Latest Ref Range: <150 mg/dL 74  VLDL Latest Ref Range: 0 - 40 mg/dL 15  LDL is not at goal and neither is hdl. Pt will need to change to Crestor on discharge and we will increase Lipitor for now.    DM2-A1c 6.7 reflecting excellent diabetic control PTA -Use Novolog/Humalog Sliding scale insulin with Accu-Cheks/Fingersticks as ordered.   Hypothyroidism--continue levothyroxine 175 mcg daily. 10/02/20 Continue levothyroxine at 175 mcg daily. Patient to be taken on an empty stomach in the morning. Thyroid function test pending.  DVT prophylaxis:  heparin  Disposition Plan: TBD  Consultants:  Cardiology Nephrology  Procedures:  none  Subjective/Overview: Pt admitted with chf exacerbation ,currently stable on 4L [place]. Pt admitted on the 18th, seen by cardiology and started on heparin drip. Plan per cardiology is for left heart cath once dialysis access is established. Pt seen by nephrology Dr.webb and Pt had HD access placed on 10/01/20 by IR. Pt had HD with 1L removed yesterday.pt is alert,awake and oriented.   Objective: Vitals:   10/02/20 0052 10/02/20 0311 10/02/20 0500 10/02/20 0731  BP: (!) 157/71 (!) 141/64  (!) 146/68  Pulse: 98 96  89  Resp:  20  (!) 21  Temp:  98.3 F (36.8 C)  98.6 F (37 C)  TempSrc:  Oral  Oral  SpO2:  92%  94%  Weight:   106.3 kg   Height:       SpO2: 94 % O2 Flow Rate (L/min): 4 L/min  Intake/Output Summary (Last 24 hours) at 10/02/2020 0830 Last data filed at 10/02/2020 0700 Gross per 24 hour  Intake 799.74 ml  Output 2850 ml  Net -2050.26 ml   Filed Weights   10/01/20 0500 10/01/20 1430 10/02/20 0500  Weight: 106.6 kg 105.1 kg 106.3 kg    Examination: Blood pressure (!) 146/68, pulse 89, temperature 98.6 F (37 C), temperature source Oral, resp. rate (!) 21, height 5\' 9"  (1.753 m), weight  106.3 kg, SpO2 94 %. General exam: Appears calm and comfortable  HEENT:EOMI, perrl  Respiratory system: Clear to auscultation. Respiratory effort normal. Cardiovascular system: S1 & S2 heard, RRR. No JVD, murmurs, rubs, gallops or clicks. No pedal edema. Gastrointestinal system: Abdomen is nondistended, soft and nontender. No organomegaly or masses felt. Normal bowel sounds heard. Central nervous system: Alert and oriented.CN grossly intact No focal neurological deficits. Extremities: pt moving all 4 ext and ambulating. Skin: No rashes, lesions or ulcers Psychiatry: Judgement and insight appear normal. Mood & affect appropriate.     Data Reviewed: I have personally reviewed following labs and imaging studies  Labs  No results for input(s): CKTOTAL, CKMB, TROPONINI in the last 72 hours. Lab Results  Component Value Date   WBC 6.3 10/02/2020   HGB 8.3 (L) 10/02/2020   HCT 25.3 (L) 10/02/2020   MCV 90.4 10/02/2020   PLT 163 10/02/2020    Recent Labs  Lab 09/30/20 0654 10/01/20 0417 10/01/20 1932  NA 142   < > 141  K 4.4   < > 4.4  CL 113*   < > 110  CO2 18*   < > 18*  BUN 66*   < > 71*  CREATININE 5.56*   < > 5.96*  CALCIUM 8.2*   < > 7.9*  PROT 7.7  --   --   BILITOT 0.5  --   --   ALKPHOS 58  --   --   ALT 16  --   --   AST 15  --   --   GLUCOSE 141*   < > 137*   < > = values in this interval not displayed.   Lab Results  Component Value Date   CHOL 147 10/02/2020   HDL 39 (L) 10/02/2020   LDLCALC 93 10/02/2020   TRIG 74 10/02/2020   Lab Results  Component Value Date   DDIMER 2.70 (H) 09/23/2019   Invalid input(s): POCBNP  Radiology Studies: IR Fluoro Guide CV Line Right  Result Date: 10/01/2020 INDICATION: END-STAGE RENAL DISEASE, NO CURRENT ACCESS FOR DIALYSIS EXAM: ULTRASOUND FLUOROSCOPIC RIGHT IJ TEMPORARY DIALYSIS CATHETER [facility] CATHETER) MEDICATIONS: 1% lidocaine local ANESTHESIA/SEDATION: None. FLUOROSCOPY TIME:  Fluoroscopy Time: 0 minutes 36  seconds (29.8 mGy). COMPLICATIONS: None immediate. PROCEDURE: Informed written consent was obtained from the patient after a thorough discussion of the procedural risks, benefits and alternatives. All questions were addressed. Maximal Sterile Barrier Technique was utilized including caps, mask, sterile gowns, sterile gloves, sterile drape, hand hygiene and skin antiseptic. A timeout was performed prior to the initiation of the procedure. Under sterile conditions and local anesthesia, ultrasound micropuncture access performed of the right internal jugular vein. Images obtained for documentation of the patent right internal jugular vein. Micro dilator advanced. Amplatz guidewire inserted. Tract dilatation performed to insert a 20 cm her catheter. Tip  position SVC RA junction. Images obtained for documentation. Access secured with prolene sutures. Blood aspirated easily followed by saline and heparin flushes. Appropriate volume and strength of heparin instilled in all 3 lumens followed by external caps and a sterile dressing. No immediate complication. Patient tolerated the procedure well. IMPRESSION: Successful ultrasound and fluoroscopic right IJ temporary dialysis catheter (Mahurkar catheter). Access ready for use. Electronically Signed   By: Jerilynn Mages.  Shick M.D.   On: 10/01/2020 16:04   IR US Guide Vasc Access Right  Result Date: 10/01/2020 INDICATION: END-STAGE RENAL DISEASE, NO CURRENT ACCESS FOR DIALYSIS EXAM: ULTRASOUND FLUOROSCOPIC RIGHT IJ TEMPORARY DIALYSIS CATHETER [facility] CATHETER) MEDICATIONS: 1% lidocaine local ANESTHESIA/SEDATION: None. FLUOROSCOPY TIME:  Fluoroscopy Time: 0 minutes 36 seconds (29.8 mGy). COMPLICATIONS: None immediate. PROCEDURE: Informed written consent was obtained from the patient after a thorough discussion of the procedural risks, benefits and alternatives. All questions were addressed. Maximal Sterile Barrier Technique was utilized including caps, mask, sterile gowns, sterile  gloves, sterile drape, hand hygiene and skin antiseptic. A timeout was performed prior to the initiation of the procedure. Under sterile conditions and local anesthesia, ultrasound micropuncture access performed of the right internal jugular vein. Images obtained for documentation of the patent right internal jugular vein. Micro dilator advanced. Amplatz guidewire inserted. Tract dilatation performed to insert a 20 cm her catheter. Tip position SVC RA junction. Images obtained for documentation. Access secured with prolene sutures. Blood aspirated easily followed by saline and heparin flushes. Appropriate volume and strength of heparin instilled in all 3 lumens followed by external caps and a sterile dressing. No immediate complication. Patient tolerated the procedure well. IMPRESSION: Successful ultrasound and fluoroscopic right IJ temporary dialysis catheter (Mahurkar catheter). Access ready for use. Electronically Signed   By: Jerilynn Mages.  Shick M.D.   On: 10/01/2020 16:04   DG Chest Portable 1 View  Result Date: 09/30/2020 CLINICAL DATA:  Status post thoracentesis. EXAM: PORTABLE CHEST 1 VIEW COMPARISON:  Earlier today at 6:49 a.m. FINDINGS: 11:19 a.m. Midline trachea. Mild cardiomegaly. Atherosclerosis in the transverse aorta. Decrease in small left pleural effusion. Probable tiny layering right pleural effusion. No pneumothorax. Mild congestive heart failure, improved. Persistent right and improved left base airspace disease. IMPRESSION: Decreased sided pleural effusion, without pneumothorax. Improved mild interstitial edema with left greater than right small bilateral pleural effusions and adjacent Airspace disease, likely atelectasis. Aortic Atherosclerosis (ICD10-I70.0). Electronically Signed   By: Abigail Miyamoto M.D.   On: 09/30/2020 11:29   ECHOCARDIOGRAM COMPLETE  Result Date: 09/30/2020    ECHOCARDIOGRAM REPORT   Patient Name:   Michael Trevino Date of Exam: 09/30/2020 Medical Rec #:  [id number]    Height:        69.0 in Accession #:    [id number]   Weight:       240.0 lb Date of Birth:  [birth year]    BSA:          2.232 m Patient Age:    82 years     BP:           167/58 mmHg Patient Gender: M            HR:           74 bpm. Exam Location:  [facility] Procedure: 2D Echo Indications:     Dyspnea 786.09 / R06.00  History:         Patient has prior history of Echocardiogram examinations, most  recent 09/24/2019. CHF; Risk Factors:Former Smoker,                  Dyslipidemia, Hypertension and Diabetes. Nonrheumatic aortic                  valve insufficiency.  Sonographer:     Leavy Cella RDCS (AE) Referring Phys:  [id number] Arnoldo Lenis Diagnosing Phys: Carlyle Dolly MD IMPRESSIONS  1. Left ventricular ejection fraction, by estimation, is 55 to 60%. The left ventricle has normal function. The left ventricle has no regional wall motion abnormalities. There is severe left ventricular hypertrophy. Left ventricular diastolic parameters  are indeterminate. Elevated left atrial pressure.  2. Right ventricular systolic function is normal. The right ventricular size is normal.  3. A small pericardial effusion is present. The pericardial effusion is circumferential. There is no evidence of cardiac tamponade. Large pleural effusion in the left lateral region.  4. The mitral valve is normal in structure. Trivial mitral valve regurgitation. No evidence of mitral stenosis.  5. The aortic valve was not well visualized. There is mild calcification of the aortic valve. There is mild thickening of the aortic valve. Aortic valve regurgitation is moderate. Moderate aortic valve stenosis.  6. The inferior vena cava is normal in size with greater than 50% respiratory variability, suggesting right atrial pressure of 3 mmHg. FINDINGS  Left Ventricle: Left ventricular ejection fraction, by estimation, is 55 to 60%. The left ventricle has normal function. The left ventricle has no regional wall motion abnormalities. The  left ventricular internal cavity size was normal in size. There is  severe left ventricular hypertrophy. Left ventricular diastolic parameters are indeterminate. Elevated left atrial pressure. Right Ventricle: The right ventricular size is normal. No increase in right ventricular wall thickness. Right ventricular systolic function is normal. Left Atrium: Left atrial size was normal in size. Right Atrium: Right atrial size was normal in size. Pericardium: A small pericardial effusion is present. The pericardial effusion is circumferential. There is no evidence of cardiac tamponade. Mitral Valve: The mitral valve is normal in structure. There is mild thickening of the mitral valve leaflet(s). There is mild calcification of the mitral valve leaflet(s). Mild mitral annular calcification. Trivial mitral valve regurgitation. No evidence  of mitral valve stenosis. Tricuspid Valve: The tricuspid valve is not well visualized. Tricuspid valve regurgitation is mild . No evidence of tricuspid stenosis. Aortic Valve: The aortic valve was not well visualized. There is mild calcification of the aortic valve. There is mild thickening of the aortic valve. There is mild aortic valve annular calcification. Aortic valve regurgitation is moderate. Aortic regurgitation PHT measures 349 msec. Moderate aortic stenosis is present. Aortic valve mean gradient measures 19.4 mmHg. Aortic valve peak gradient measures 34.7 mmHg. Aortic valve area, by VTI measures 1.30 cm. Pulmonic Valve: The pulmonic valve was not well visualized. Pulmonic valve regurgitation is not visualized. No evidence of pulmonic stenosis. Aorta: The aortic root is normal in size and structure. Pulmonary Artery: Indeterminate PASP, inadequate TR jet. Venous: The inferior vena cava is normal in size with greater than 50% respiratory variability, suggesting right atrial pressure of 3 mmHg. IAS/Shunts: No atrial level shunt detected by color flow Doppler. Additional Comments:  There is a large pleural effusion in the left lateral region.  LEFT VENTRICLE PLAX 2D LVIDd:         5.34 cm  Diastology LVIDs:         2.37 cm  LV e' lateral:   3.59 cm/s LV  PW:         2.03 cm  LV E/e' lateral: 34.3 LV IVS:        1.69 cm LVOT diam:     2.10 cm LV SV:         87 LV SV Index:   39 LVOT Area:     3.46 cm  RIGHT VENTRICLE RV S prime:     14.20 cm/s TAPSE (M-mode): 2.2 cm LEFT ATRIUM             Index       RIGHT ATRIUM          Index LA diam:        3.40 cm 1.52 cm/m  RA Area:     8.96 cm LA Vol (A2C):   70.3 ml 31.49 ml/m RA Volume:   17.80 ml 7.97 ml/m LA Vol (A4C):   47.8 ml 21.41 ml/m LA Biplane Vol: 58.7 ml 26.29 ml/m  AORTIC VALVE AV Area (Vmax):    1.29 cm AV Area (Vmean):   1.24 cm AV Area (VTI):     1.30 cm AV Vmax:           294.66 cm/s AV Vmean:          207.077 cm/s AV VTI:            0.669 m AV Peak Grad:      34.7 mmHg AV Mean Grad:      19.4 mmHg LVOT Vmax:         109.33 cm/s LVOT Vmean:        73.863 cm/s LVOT VTI:          0.252 m LVOT/AV VTI ratio: 0.38 AI PHT:            349 msec  AORTA Ao Root diam: 3.10 cm MITRAL VALVE MV Area (PHT): 3.23 cm     SHUNTS MV Decel Time: 235 msec     Systemic VTI:  0.25 m MV E velocity: 123.00 cm/s  Systemic Diam: 2.10 cm MV A velocity: 101.00 cm/s MV E/A ratio:  1.22 Carlyle Dolly MD Electronically signed by Carlyle Dolly MD Signature Date/Time: 09/30/2020/12:04:11 PM    Final (Updated)    US THORACENTESIS ASP PLEURAL SPACE W/IMG GUIDE  Result Date: 09/30/2020 INDICATION: Left pleural effusion CHF EXAM: ULTRASOUND GUIDED LEFT THORACENTESIS MEDICATIONS: 10 cc 1% lidocaine. COMPLICATIONS: None immediate. PROCEDURE: An ultrasound guided thoracentesis was thoroughly discussed with the patient and questions answered. The benefits, risks, alternatives and complications were also discussed. The patient understands and wishes to proceed with the procedure. Written consent was obtained. Ultrasound was performed to localize and mark an  adequate pocket of fluid in the left chest. The area was then prepped and draped in the normal sterile fashion. 1% Lidocaine was used for local anesthesia. Under ultrasound guidance a 19 G Yueh catheter was introduced. Thoracentesis was performed. The catheter was removed and a dressing applied. FINDINGS: A total of approximately 1 liter of blood tinged fluid was removed. Samples were sent to the laboratory as requested by the clinical team. IMPRESSION: Successful ultrasound guided left thoracentesis yielding 1 liter of pleural fluid. No PTX post procedure CXR Read by Lavonia Drafts [facility] Electronically Signed   By: Abigail Miyamoto M.D.   On: 09/30/2020 11:30       Anti-infectives (From admission, onward)   None       Continuous Infusions: . sodium chloride    . furosemide 120 mg (10/01/20 1801)  .  heparin 1,150 Units/hr (10/02/20 0443)  . nitroGLYCERIN 5 mcg/min (10/02/20 0443)     LOS: 2 days    Para Skeans, MD [facility] Pager [phone] How to contact the [facility] Attending or Consulting provider [place] or covering provider during after hours [place], for this patient?    1. Check the care team in [facility] and look for a) attending/consulting [facility] provider listed and b) the [facility] team listed 2. Log into [email or web address] and use [place] universal password to access. If you do not have the password, please contact the hospital operator. 3. Locate the [facility] provider you are looking for under [facility] and page to a number that you can be directly reached. 4. If you still have difficulty reaching the provider, please page the [facility] (Director on Call) for the Hospitalists listed on amion for assistance. [email or web address] Password [email or web address] 10/02/2020, 8:30 AM

## 2020-10-02 NOTE — Progress Notes (Signed)
Progress Note  Patient Name: Michael Trevino Date of Encounter: 10/02/2020  Primary Cardiologist: Minus Breeding, MD   Subjective   NAEO. HD session yesterday with 1L removed. Tolerated the session well. No complaints this AM.   Inpatient Medications    Scheduled Meds: . amLODipine  10 mg Oral Daily  . ascorbic acid  500 mg Oral Daily  . aspirin EC  81 mg Oral Daily  . atorvastatin  80 mg Oral Daily  . calcitRIOL  0.5 mcg Oral Daily  . calcium carbonate  1 tablet Oral BID WC  . Chlorhexidine Gluconate Cloth  6 each Topical Daily  . Chlorhexidine Gluconate Cloth  6 each Topical Q0600  . cholecalciferol  1,000 Units Oral Daily  . hydrALAZINE  50 mg Oral TID  . insulin aspart  0-5 Units Subcutaneous QHS  . insulin aspart  0-9 Units Subcutaneous TID WC  . insulin detemir  25 Units Subcutaneous QHS  . levothyroxine  175 mcg Oral QAC breakfast  . metoprolol tartrate  100 mg Oral BID  . multivitamin with minerals  1 tablet Oral Daily  . mupirocin ointment  1 application Nasal BID  . sodium chloride flush  3 mL Intravenous Q12H   Continuous Infusions: . sodium chloride    . furosemide 120 mg (10/01/20 1801)  . heparin 1,150 Units/hr (10/02/20 0443)  . nitroGLYCERIN 5 mcg/min (10/02/20 0443)   PRN Meds: sodium chloride, acetaminophen **OR** acetaminophen, bisacodyl, lidocaine, nitroGLYCERIN, ondansetron **OR** ondansetron (ZOFRAN) IV, polyethylene glycol, sodium chloride flush, traZODone   Vital Signs    Vitals:   10/02/20 0052 10/02/20 0311 10/02/20 0500 10/02/20 0731  BP: (!) 157/71 (!) 141/64  (!) 146/68  Pulse: 98 96  89  Resp:  20  (!) 21  Temp:  98.3 F (36.8 C)  98.6 F (37 C)  TempSrc:  Oral  Oral  SpO2:  92%  94%  Weight:   106.3 kg   Height:        Intake/Output Summary (Last 24 hours) at 10/02/2020 0959 Last data filed at 10/02/2020 0700 Gross per 24 hour  Intake 799.74 ml  Output 2850 ml  Net -2050.26 ml   Filed Weights   10/01/20 0500 10/01/20  1430 10/02/20 0500  Weight: 106.6 kg 105.1 kg 106.3 kg    Telemetry    Sinus rhythm. Rare PVCs. - Personally Reviewed  ECG    No new  Physical Exam   GEN: No acute distress.  At 30 degrees resting comfortably. Neck: R tunneled HD catheter. Cardiac: RRR, no murmurs, rubs, or gallops. 1+ pitting edema to the knees. Respiratory: Clear to auscultation bilaterally. No IWOB. GI: Soft, nontender, non-distended  MS: No edema; No deformity. Neuro:  Nonfocal  Psych: Normal affect   Labs    Chemistry Recent Labs  Lab 09/30/20 0654 10/01/20 0417 10/01/20 1932  NA 142 141 141  K 4.4 4.3 4.4  CL 113* 110 110  CO2 18* 20* 18*  GLUCOSE 141* 106* 137*  BUN 66* 67* 71*  CREATININE 5.56* 5.73* 5.96*  CALCIUM 8.2* 7.9* 7.9*  PROT 7.7  --   --   ALBUMIN 3.4*  --  2.4*  AST 15  --   --   ALT 16  --   --   ALKPHOS 58  --   --   BILITOT 0.5  --   --   GFRNONAA 10* 9* 9*  ANIONGAP 11 11 13      Hematology Recent Labs  Lab 10/01/20  3810 10/01/20 1932 10/02/20 0340  WBC 4.9 7.1 6.3  RBC 2.86* 2.75* 2.80*  HGB 8.5* 8.1* 8.3*  HCT 26.4* 25.4* 25.3*  MCV 92.3 92.4 90.4  MCH 29.7 29.5 29.6  MCHC 32.2 31.9 32.8  RDW 14.2 14.3 14.3  PLT 169 162 163    Cardiac EnzymesNo results for input(s): TROPONINI in the last 168 hours. No results for input(s): TROPIPOC in the last 168 hours.   BNP Recent Labs  Lab 09/30/20 0654  BNP 1,022.0*     DDimer No results for input(s): DDIMER in the last 168 hours.   Radiology    IR Fluoro Guide CV Line Right  Result Date: 10/01/2020 INDICATION: END-STAGE RENAL DISEASE, NO CURRENT ACCESS FOR DIALYSIS EXAM: ULTRASOUND FLUOROSCOPIC RIGHT IJ TEMPORARY DIALYSIS CATHETER [facility] CATHETER) MEDICATIONS: 1% lidocaine local ANESTHESIA/SEDATION: None. FLUOROSCOPY TIME:  Fluoroscopy Time: 0 minutes 36 seconds (29.8 mGy). COMPLICATIONS: None immediate. PROCEDURE: Informed written consent was obtained from the patient after a thorough discussion of  the procedural risks, benefits and alternatives. All questions were addressed. Maximal Sterile Barrier Technique was utilized including caps, mask, sterile gowns, sterile gloves, sterile drape, hand hygiene and skin antiseptic. A timeout was performed prior to the initiation of the procedure. Under sterile conditions and local anesthesia, ultrasound micropuncture access performed of the right internal jugular vein. Images obtained for documentation of the patent right internal jugular vein. Micro dilator advanced. Amplatz guidewire inserted. Tract dilatation performed to insert a 20 cm her catheter. Tip position SVC RA junction. Images obtained for documentation. Access secured with prolene sutures. Blood aspirated easily followed by saline and heparin flushes. Appropriate volume and strength of heparin instilled in all 3 lumens followed by external caps and a sterile dressing. No immediate complication. Patient tolerated the procedure well. IMPRESSION: Successful ultrasound and fluoroscopic right IJ temporary dialysis catheter (Mahurkar catheter). Access ready for use. Electronically Signed   By: Jerilynn Mages.  Shick M.D.   On: 10/01/2020 16:04   IR US Guide Vasc Access Right  Result Date: 10/01/2020 INDICATION: END-STAGE RENAL DISEASE, NO CURRENT ACCESS FOR DIALYSIS EXAM: ULTRASOUND FLUOROSCOPIC RIGHT IJ TEMPORARY DIALYSIS CATHETER [facility] CATHETER) MEDICATIONS: 1% lidocaine local ANESTHESIA/SEDATION: None. FLUOROSCOPY TIME:  Fluoroscopy Time: 0 minutes 36 seconds (29.8 mGy). COMPLICATIONS: None immediate. PROCEDURE: Informed written consent was obtained from the patient after a thorough discussion of the procedural risks, benefits and alternatives. All questions were addressed. Maximal Sterile Barrier Technique was utilized including caps, mask, sterile gowns, sterile gloves, sterile drape, hand hygiene and skin antiseptic. A timeout was performed prior to the initiation of the procedure. Under sterile conditions and  local anesthesia, ultrasound micropuncture access performed of the right internal jugular vein. Images obtained for documentation of the patent right internal jugular vein. Micro dilator advanced. Amplatz guidewire inserted. Tract dilatation performed to insert a 20 cm her catheter. Tip position SVC RA junction. Images obtained for documentation. Access secured with prolene sutures. Blood aspirated easily followed by saline and heparin flushes. Appropriate volume and strength of heparin instilled in all 3 lumens followed by external caps and a sterile dressing. No immediate complication. Patient tolerated the procedure well. IMPRESSION: Successful ultrasound and fluoroscopic right IJ temporary dialysis catheter (Mahurkar catheter). Access ready for use. Electronically Signed   By: Jerilynn Mages.  Shick M.D.   On: 10/01/2020 16:04   DG Chest Portable 1 View  Result Date: 09/30/2020 CLINICAL DATA:  Status post thoracentesis. EXAM: PORTABLE CHEST 1 VIEW COMPARISON:  Earlier today at 6:49 a.m. FINDINGS: 11:19 a.m. Midline trachea. Mild  cardiomegaly. Atherosclerosis in the transverse aorta. Decrease in small left pleural effusion. Probable tiny layering right pleural effusion. No pneumothorax. Mild congestive heart failure, improved. Persistent right and improved left base airspace disease. IMPRESSION: Decreased sided pleural effusion, without pneumothorax. Improved mild interstitial edema with left greater than right small bilateral pleural effusions and adjacent Airspace disease, likely atelectasis. Aortic Atherosclerosis (ICD10-I70.0). Electronically Signed   By: Abigail Miyamoto M.D.   On: 09/30/2020 11:29   ECHOCARDIOGRAM COMPLETE  Result Date: 09/30/2020    ECHOCARDIOGRAM REPORT   Patient Name:   TARVARIS PUGLIA Date of Exam: 09/30/2020 Medical Rec #:  [id number]    Height:       69.0 in Accession #:    [id number]   Weight:       240.0 lb Date of Birth:  11/07/1941    BSA:          2.232 m Patient Age:    55 years     BP:            167/58 mmHg Patient Gender: M            HR:           74 bpm. Exam Location:  [facility] Procedure: 2D Echo Indications:     Dyspnea 786.09 / R06.00  History:         Patient has prior history of Echocardiogram examinations, most                  recent 09/24/2019. CHF; Risk Factors:Former Smoker,                  Dyslipidemia, Hypertension and Diabetes. Nonrheumatic aortic                  valve insufficiency.  Sonographer:     Leavy Cella RDCS (AE) Referring Phys:  [id number] Arnoldo Lenis Diagnosing Phys: Carlyle Dolly MD IMPRESSIONS  1. Left ventricular ejection fraction, by estimation, is 55 to 60%. The left ventricle has normal function. The left ventricle has no regional wall motion abnormalities. There is severe left ventricular hypertrophy. Left ventricular diastolic parameters  are indeterminate. Elevated left atrial pressure.  2. Right ventricular systolic function is normal. The right ventricular size is normal.  3. A small pericardial effusion is present. The pericardial effusion is circumferential. There is no evidence of cardiac tamponade. Large pleural effusion in the left lateral region.  4. The mitral valve is normal in structure. Trivial mitral valve regurgitation. No evidence of mitral stenosis.  5. The aortic valve was not well visualized. There is mild calcification of the aortic valve. There is mild thickening of the aortic valve. Aortic valve regurgitation is moderate. Moderate aortic valve stenosis.  6. The inferior vena cava is normal in size with greater than 50% respiratory variability, suggesting right atrial pressure of 3 mmHg. FINDINGS  Left Ventricle: Left ventricular ejection fraction, by estimation, is 55 to 60%. The left ventricle has normal function. The left ventricle has no regional wall motion abnormalities. The left ventricular internal cavity size was normal in size. There is  severe left ventricular hypertrophy. Left ventricular diastolic parameters are  indeterminate. Elevated left atrial pressure. Right Ventricle: The right ventricular size is normal. No increase in right ventricular wall thickness. Right ventricular systolic function is normal. Left Atrium: Left atrial size was normal in size. Right Atrium: Right atrial size was normal in size. Pericardium: A small pericardial effusion is present. The pericardial effusion is circumferential. There  is no evidence of cardiac tamponade. Mitral Valve: The mitral valve is normal in structure. There is mild thickening of the mitral valve leaflet(s). There is mild calcification of the mitral valve leaflet(s). Mild mitral annular calcification. Trivial mitral valve regurgitation. No evidence  of mitral valve stenosis. Tricuspid Valve: The tricuspid valve is not well visualized. Tricuspid valve regurgitation is mild . No evidence of tricuspid stenosis. Aortic Valve: The aortic valve was not well visualized. There is mild calcification of the aortic valve. There is mild thickening of the aortic valve. There is mild aortic valve annular calcification. Aortic valve regurgitation is moderate. Aortic regurgitation PHT measures 349 msec. Moderate aortic stenosis is present. Aortic valve mean gradient measures 19.4 mmHg. Aortic valve peak gradient measures 34.7 mmHg. Aortic valve area, by VTI measures 1.30 cm. Pulmonic Valve: The pulmonic valve was not well visualized. Pulmonic valve regurgitation is not visualized. No evidence of pulmonic stenosis. Aorta: The aortic root is normal in size and structure. Pulmonary Artery: Indeterminate PASP, inadequate TR jet. Venous: The inferior vena cava is normal in size with greater than 50% respiratory variability, suggesting right atrial pressure of 3 mmHg. IAS/Shunts: No atrial level shunt detected by color flow Doppler. Additional Comments: There is a large pleural effusion in the left lateral region.  LEFT VENTRICLE PLAX 2D LVIDd:         5.34 cm  Diastology LVIDs:         2.37 cm  LV  e' lateral:   3.59 cm/s LV PW:         2.03 cm  LV E/e' lateral: 34.3 LV IVS:        1.69 cm LVOT diam:     2.10 cm LV SV:         87 LV SV Index:   39 LVOT Area:     3.46 cm  RIGHT VENTRICLE RV S prime:     14.20 cm/s TAPSE (M-mode): 2.2 cm LEFT ATRIUM             Index       RIGHT ATRIUM          Index LA diam:        3.40 cm 1.52 cm/m  RA Area:     8.96 cm LA Vol (A2C):   70.3 ml 31.49 ml/m RA Volume:   17.80 ml 7.97 ml/m LA Vol (A4C):   47.8 ml 21.41 ml/m LA Biplane Vol: 58.7 ml 26.29 ml/m  AORTIC VALVE AV Area (Vmax):    1.29 cm AV Area (Vmean):   1.24 cm AV Area (VTI):     1.30 cm AV Vmax:           294.66 cm/s AV Vmean:          207.077 cm/s AV VTI:            0.669 m AV Peak Grad:      34.7 mmHg AV Mean Grad:      19.4 mmHg LVOT Vmax:         109.33 cm/s LVOT Vmean:        73.863 cm/s LVOT VTI:          0.252 m LVOT/AV VTI ratio: 0.38 AI PHT:            349 msec  AORTA Ao Root diam: 3.10 cm MITRAL VALVE MV Area (PHT): 3.23 cm     SHUNTS MV Decel Time: 235 msec     Systemic VTI:  0.25 m  MV E velocity: 123.00 cm/s  Systemic Diam: 2.10 cm MV A velocity: 101.00 cm/s MV E/A ratio:  1.22 Carlyle Dolly MD Electronically signed by Carlyle Dolly MD Signature Date/Time: 09/30/2020/12:04:11 PM    Final (Updated)    US THORACENTESIS ASP PLEURAL SPACE W/IMG GUIDE  Result Date: 09/30/2020 INDICATION: Left pleural effusion CHF EXAM: ULTRASOUND GUIDED LEFT THORACENTESIS MEDICATIONS: 10 cc 1% lidocaine. COMPLICATIONS: None immediate. PROCEDURE: An ultrasound guided thoracentesis was thoroughly discussed with the patient and questions answered. The benefits, risks, alternatives and complications were also discussed. The patient understands and wishes to proceed with the procedure. Written consent was obtained. Ultrasound was performed to localize and mark an adequate pocket of fluid in the left chest. The area was then prepped and draped in the normal sterile fashion. 1% Lidocaine was used for local  anesthesia. Under ultrasound guidance a 19 G Yueh catheter was introduced. Thoracentesis was performed. The catheter was removed and a dressing applied. FINDINGS: A total of approximately 1 liter of blood tinged fluid was removed. Samples were sent to the laboratory as requested by the clinical team. IMPRESSION: Successful ultrasound guided left thoracentesis yielding 1 liter of pleural fluid. No PTX post procedure CXR Read by Lavonia Drafts [facility] Electronically Signed   By: Abigail Miyamoto M.D.   On: 09/30/2020 11:30    Cardiac Studies   09/30/2020 Echo personally reviewed LV function normal, 55% Severe LVH RV normal Small PEff, circumferential Trivial M Mild thickening of AV, moderate AR  Patient Profile     79 y.o. male with known CAD admitted with NSTEMI. EF remains normal on TTE this admission. Will plan for LHC, possibly Monday, now that he has been started on iHD.   Assessment & Plan    1. NSTEMI Continue ASA, Atorva 80, Metoprolol Continue heparin gtt - plan for [facility] Monday to assess his CAD - NPO Sunday night  2. 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      Signed, Lars Mage, MD  10/02/2020, 9:59 AM

## 2020-10-03 DIAGNOSIS — I5033 Acute on chronic diastolic (congestive) heart failure: Secondary | ICD-10-CM | POA: Diagnosis not present

## 2020-10-03 DIAGNOSIS — N186 End stage renal disease: Secondary | ICD-10-CM | POA: Diagnosis not present

## 2020-10-03 DIAGNOSIS — I10 Essential (primary) hypertension: Secondary | ICD-10-CM | POA: Diagnosis not present

## 2020-10-03 DIAGNOSIS — E039 Hypothyroidism, unspecified: Secondary | ICD-10-CM | POA: Diagnosis not present

## 2020-10-03 DIAGNOSIS — I2510 Atherosclerotic heart disease of native coronary artery without angina pectoris: Secondary | ICD-10-CM | POA: Diagnosis not present

## 2020-10-03 LAB — RENAL FUNCTION PANEL
Albumin: 2.1 g/dL — ABNORMAL LOW (ref 3.5–5.0)
Anion gap: 10 (ref 5–15)
BUN: 39 mg/dL — ABNORMAL HIGH (ref 8–23)
CO2: 25 mmol/L (ref 22–32)
Calcium: 7.3 mg/dL — ABNORMAL LOW (ref 8.9–10.3)
Chloride: 105 mmol/L (ref 98–111)
Creatinine, Ser: 4.35 mg/dL — ABNORMAL HIGH (ref 0.61–1.24)
GFR, Estimated: 13 mL/min — ABNORMAL LOW (ref >60)
Glucose, Bld: 87 mg/dL (ref 70–99)
Phosphorus: 3.5 mg/dL (ref 2.5–4.6)
Potassium: 4 mmol/L (ref 3.5–5.1)
Sodium: 140 mmol/L (ref 135–145)

## 2020-10-03 LAB — CBC
HCT: 22.5 % — ABNORMAL LOW (ref 39.0–52.0)
Hemoglobin: 7 g/dL — ABNORMAL LOW (ref 13.0–17.0)
MCH: 28.6 pg (ref 26.0–34.0)
MCHC: 31.1 g/dL (ref 30.0–36.0)
MCV: 91.8 fL (ref 80.0–100.0)
Platelets: 127 10*3/uL — ABNORMAL LOW (ref 150–400)
RBC: 2.45 MIL/uL — ABNORMAL LOW (ref 4.22–5.81)
RDW: 13.9 % (ref 11.5–15.5)
WBC: 5.1 10*3/uL (ref 4.0–10.5)
nRBC: 0 % (ref 0.0–0.2)

## 2020-10-03 LAB — GLUCOSE, CAPILLARY
Glucose-Capillary: 79 mg/dL (ref 70–99)
Glucose-Capillary: 217 mg/dL — ABNORMAL HIGH (ref 70–99)
Glucose-Capillary: 151 mg/dL — ABNORMAL HIGH (ref 70–99)

## 2020-10-03 LAB — HEPARIN LEVEL (UNFRACTIONATED): Heparin Unfractionated: 0.28 IU/mL — ABNORMAL LOW (ref 0.30–0.70)

## 2020-10-03 MED ORDER — CHLORHEXIDINE GLUCONATE CLOTH 2 % EX PADS
6.0000 | MEDICATED_PAD | Freq: Every day | CUTANEOUS | Status: DC
  Administered 2020-10-03 – 2020-10-04 (×2): 6 via TOPICAL

## 2020-10-03 MED ORDER — SODIUM CHLORIDE 0.9 % IV SOLN
500.0000 mg | INTRAVENOUS | Status: AC
  Administered 2020-10-03 – 2020-10-04 (×2): 500 mg via INTRAVENOUS
  Filled 2020-10-03 (×2): qty 25

## 2020-10-03 MED ORDER — HEPARIN SODIUM (PORCINE) 1000 UNIT/ML IJ SOLN
INTRAMUSCULAR | Status: AC
  Administered 2020-10-03: 1000 [IU] via INTRAVENOUS_CENTRAL
  Filled 2020-10-03: qty 3

## 2020-10-03 MED ORDER — SODIUM CHLORIDE 0.9 % IV SOLN
125.0000 mg | Freq: Once | INTRAVENOUS | Status: DC
  Filled 2020-10-03: qty 10

## 2020-10-03 NOTE — H&P (View-Only) (Signed)
Progress Note  Patient Name: Michael Trevino Date of Encounter: 10/03/2020  Primary Cardiologist: Minus Breeding, MD   Subjective   NAEO. HD session yesterday with 3L removed. Tolerated the session well. Some confusion overnight.  Now alert and oriented x3.   No complaints this AM.    Inpatient Medications    Scheduled Meds: . amLODipine  10 mg Oral Daily  . ascorbic acid  500 mg Oral Daily  . [START ON 10/04/2020] aspirin  81 mg Oral Pre-Cath  . aspirin EC  81 mg Oral Daily  . atorvastatin  80 mg Oral Daily  . calcitRIOL  0.5 mcg Oral Daily  . calcium carbonate  1 tablet Oral BID WC  . Chlorhexidine Gluconate Cloth  6 each Topical Daily  . Chlorhexidine Gluconate Cloth  6 each Topical Q0600  . Chlorhexidine Gluconate Cloth  6 each Topical Q0600  . cholecalciferol  1,000 Units Oral Daily  . hydrALAZINE  50 mg Oral TID  . insulin aspart  0-5 Units Subcutaneous QHS  . insulin aspart  0-9 Units Subcutaneous TID WC  . insulin detemir  25 Units Subcutaneous QHS  . levothyroxine  175 mcg Oral QAC breakfast  . metoprolol tartrate  100 mg Oral BID  . multivitamin with minerals  1 tablet Oral Daily  . mupirocin ointment  1 application Nasal BID  . sodium chloride flush  3 mL Intravenous Q12H  . sodium chloride flush  3 mL Intravenous Q12H   Continuous Infusions: . sodium chloride    . sodium chloride    . sodium chloride    . sodium chloride    . [START ON 10/04/2020] sodium chloride    . ferric gluconate (FERRLECIT/NULECIT) IV    . heparin 1,150 Units/hr (10/03/20 2919)  . iron sucrose    . nitroGLYCERIN 5 mcg/min (10/02/20 0443)   PRN Meds: sodium chloride, sodium chloride, sodium chloride, sodium chloride, acetaminophen **OR** acetaminophen, alteplase, bisacodyl, heparin, lidocaine (PF), lidocaine, lidocaine-prilocaine, nitroGLYCERIN, ondansetron **OR** ondansetron (ZOFRAN) IV, pentafluoroprop-tetrafluoroeth, polyethylene glycol, sodium chloride flush, sodium chloride  flush, traZODone   Vital Signs    Vitals:   10/02/20 2300 10/03/20 0300 10/03/20 0302 10/03/20 0715  BP: (!) 112/48 (!) 135/56  114/85  Pulse: 79 83  92  Resp: (!) 22 (!) 24 17 20   Temp: 98.9 F (37.2 C) (!) 100.5 F (38.1 C)  99.9 F (37.7 C)  TempSrc: Oral Oral  Oral  SpO2: 95% 94%  94%  Weight:  102.2 kg    Height:        Intake/Output Summary (Last 24 hours) at 10/03/2020 0903 Last data filed at 10/03/2020 0800 Gross per 24 hour  Intake 360 ml  Output 3523 ml  Net -3163 ml   Filed Weights   10/02/20 1615 10/02/20 1904 10/03/20 0300  Weight: 103.8 kg 101 kg 102.2 kg    Telemetry    Sinus rhythm.  - Personally Reviewed  ECG    No new  Physical Exam   GEN: No acute distress.  At 30 degrees resting comfortably. Neck: R tunneled HD catheter. Cardiac: RRR, no murmurs, rubs, or gallops. 1+ pitting edema to the knees. Respiratory: Clear to auscultation bilaterally. No IWOB. GI: Soft, nontender, non-distended  MS: No edema; No deformity. Neuro:  Nonfocal  Psych: Normal affect   Labs    Chemistry Recent Labs  Lab 09/30/20 0654 10/01/20 0417 10/01/20 1932 10/02/20 1331 10/03/20 0425  NA 142   < > 141 136 140  K 4.4   < >  4.4 3.8 4.0  CL 113*   < > 110 104 105  CO2 18*   < > 18* 22 25  GLUCOSE 141*   < > 137* 177* 87  BUN 66*   < > 71* 47* 39*  CREATININE 5.56*   < > 5.96* 4.57* 4.35*  CALCIUM 8.2*   < > 7.9* 7.3* 7.3*  PROT 7.7  --   --   --   --   ALBUMIN 3.4*  --  2.4* 2.1* 2.1*  AST 15  --   --   --   --   ALT 16  --   --   --   --   ALKPHOS 58  --   --   --   --   BILITOT 0.5  --   --   --   --   GFRNONAA 10*   < > 9* 12* 13*  ANIONGAP 11   < > 13 10 10    < > = values in this interval not displayed.     Hematology Recent Labs  Lab 10/02/20 0340 10/02/20 1331 10/03/20 0425  WBC 6.3 5.0 5.1  RBC 2.80* 2.41* 2.45*  HGB 8.3* 7.0* 7.0*  HCT 25.3* 21.5* 22.5*  MCV 90.4 89.2 91.8  MCH 29.6 29.0 28.6  MCHC 32.8 32.6 31.1  RDW 14.3 14.1  13.9  PLT 163 131* 127*    Cardiac EnzymesNo results for input(s): TROPONINI in the last 168 hours. No results for input(s): TROPIPOC in the last 168 hours.   BNP Recent Labs  Lab 09/30/20 0654  BNP 1,022.0*     DDimer No results for input(s): DDIMER in the last 168 hours.   Radiology    IR Fluoro Guide CV Line Right  Result Date: 10/01/2020 INDICATION: END-STAGE RENAL DISEASE, NO CURRENT ACCESS FOR DIALYSIS EXAM: ULTRASOUND FLUOROSCOPIC RIGHT IJ TEMPORARY DIALYSIS CATHETER [facility] CATHETER) MEDICATIONS: 1% lidocaine local ANESTHESIA/SEDATION: None. FLUOROSCOPY TIME:  Fluoroscopy Time: 0 minutes 36 seconds (29.8 mGy). COMPLICATIONS: None immediate. PROCEDURE: Informed written consent was obtained from the patient after a thorough discussion of the procedural risks, benefits and alternatives. All questions were addressed. Maximal Sterile Barrier Technique was utilized including caps, mask, sterile gowns, sterile gloves, sterile drape, hand hygiene and skin antiseptic. A timeout was performed prior to the initiation of the procedure. Under sterile conditions and local anesthesia, ultrasound micropuncture access performed of the right internal jugular vein. Images obtained for documentation of the patent right internal jugular vein. Micro dilator advanced. Amplatz guidewire inserted. Tract dilatation performed to insert a 20 cm her catheter. Tip position SVC RA junction. Images obtained for documentation. Access secured with prolene sutures. Blood aspirated easily followed by saline and heparin flushes. Appropriate volume and strength of heparin instilled in all 3 lumens followed by external caps and a sterile dressing. No immediate complication. Patient tolerated the procedure well. IMPRESSION: Successful ultrasound and fluoroscopic right IJ temporary dialysis catheter (Mahurkar catheter). Access ready for use. Electronically Signed   By: Jerilynn Mages.  Shick M.D.   On: 10/01/2020 16:04   IR US Guide Vasc  Access Right  Result Date: 10/01/2020 INDICATION: END-STAGE RENAL DISEASE, NO CURRENT ACCESS FOR DIALYSIS EXAM: ULTRASOUND FLUOROSCOPIC RIGHT IJ TEMPORARY DIALYSIS CATHETER [facility] CATHETER) MEDICATIONS: 1% lidocaine local ANESTHESIA/SEDATION: None. FLUOROSCOPY TIME:  Fluoroscopy Time: 0 minutes 36 seconds (29.8 mGy). COMPLICATIONS: None immediate. PROCEDURE: Informed written consent was obtained from the patient after a thorough discussion of the procedural risks, benefits and alternatives. All questions were addressed. Maximal  Sterile Barrier Technique was utilized including caps, mask, sterile gowns, sterile gloves, sterile drape, hand hygiene and skin antiseptic. A timeout was performed prior to the initiation of the procedure. Under sterile conditions and local anesthesia, ultrasound micropuncture access performed of the right internal jugular vein. Images obtained for documentation of the patent right internal jugular vein. Micro dilator advanced. Amplatz guidewire inserted. Tract dilatation performed to insert a 20 cm her catheter. Tip position SVC RA junction. Images obtained for documentation. Access secured with prolene sutures. Blood aspirated easily followed by saline and heparin flushes. Appropriate volume and strength of heparin instilled in all 3 lumens followed by external caps and a sterile dressing. No immediate complication. Patient tolerated the procedure well. IMPRESSION: Successful ultrasound and fluoroscopic right IJ temporary dialysis catheter (Mahurkar catheter). Access ready for use. Electronically Signed   By: Jerilynn Mages.  Shick M.D.   On: 10/01/2020 16:04    Cardiac Studies   09/30/2020 Echo personally reviewed LV function normal, 55% Severe LVH RV normal Small PEff, circumferential Trivial M Mild thickening of AV, moderate AR  Patient Profile     79 y.o. male with known CAD admitted with NSTEMI. EF remains normal on TTE this admission. Will plan for LHC, possibly Monday, now  that he has been started on iHD.   Assessment & Plan    1. NSTEMI Continue ASA, Atorva 80, Metoprolol Continue heparin gtt - plan for [facility] Monday to assess his CAD - NPO Sunday night  2. 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      Signed, Lars Mage, MD  10/03/2020, 9:03 AM

## 2020-10-03 NOTE — Progress Notes (Signed)
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  Transferred to [facility] 10/01/2020 for IR to place hemodialysis access.  He will need vein and vascular surgery to evaluate after weekend for conversion to a permanent dialysis catheter as well as AV fistula.  He will also need renal navigator to become involved.  He is now end-stage renal disease.  Dialysis #1 10/01/2020 1 L removed.  Additional dialysis #2 10/02/2020 tolerated well with 3 L removed  Blood pressure 114/85 pulse 87 temperature 9.9 O2 sats 98% 10 L nasal cannula  Sodium 140 potassium 4 chloride 105 CO2 25 BUN 439 creatinine 4.35 glucose 87 calcium 7.3  Objective:  Vital signs in last 24 hours:  Temp:  [98.3 F (36.8 C)-100.5 F (38.1 C)] 99.9 F (37.7 C) (11/21 0715) Pulse Rate:  [77-97] 92 (11/21 0715) Resp:  [17-24] 20 (11/21 0715) BP: (112-166)/(48-85) 114/85 (11/21 0715) SpO2:  [94 %-100 %] 94 % (11/21 0715) Weight:  [101 kg-103.8 kg] 102.2 kg (11/21 0300)  Weight change: -1.3 kg Filed Weights   10/02/20 1615 10/02/20 1904 10/03/20 0300  Weight: 103.8 kg 101 kg 102.2 kg    Intake/Output: I/O last 3 completed shifts: In: 501.2 [P.O.:360; I.V.:141.2] Out: 1610 [Urine:850; Other:4023]   Intake/Output this shift:  Total I/O In: 120 [P.O.:120] Out: -   Awake alert nondistressed using additional oxygen CVS- RRR no murmurs rubs gallops RS- CTA no wheeze rales ABD- BS present soft non-distended EXT-2-3+ pitting edema lower extremities   Basic Metabolic Panel: Recent Labs  Lab 09/30/20 0654 09/30/20 0654 10/01/20 0417 10/01/20 0417 10/01/20 1932 10/02/20 1331 10/03/20 0425  NA 142  --  141  --  141 136 140  K 4.4  --  4.3  --  4.4 3.8 4.0  CL 113*  --  110   --  110 104 105  CO2 18*  --  20*  --  18* 22 25  GLUCOSE 141*  --  106*  --  137* 177* 87  BUN 66*  --  67*  --  71* 47* 39*  CREATININE 5.56*  --  5.73*  --  5.96* 4.57* 4.35*  CALCIUM 8.2*   < > 7.9*   < > 7.9* 7.3* 7.3*  PHOS  --   --   --   --  4.9* 4.1 3.5   < > = values in this interval not displayed.    Liver Function Tests: Recent Labs  Lab 09/30/20 0654 10/01/20 1932 10/02/20 1331 10/03/20 0425  AST 15  --   --   --   ALT 16  --   --   --   ALKPHOS 58  --   --   --   BILITOT 0.5  --   --   --   PROT 7.7  --   --   --   ALBUMIN 3.4* 2.4* 2.1* 2.1*   No results for input(s): LIPASE, AMYLASE in the last 168 hours. No results for input(s): AMMONIA in the last 168 hours.  CBC: Recent Labs  Lab 09/28/20 1122 09/28/20 1122 09/30/20 0654 09/30/20 0654 10/01/20 0417 10/01/20 1932 10/02/20 0340 10/02/20 1331 10/03/20 0425  WBC 4.9   < > 7.7   < > 4.9 7.1 6.3 5.0 5.1  NEUTROABS 2.6  --  4.1  --   --   --   --   --   --  HGB 9.0*   < > 9.6*   < > 8.5* 8.1* 8.3* 7.0* 7.0*  HCT 27.7*   < > 29.8*   < > 26.4* 25.4* 25.3* 21.5* 22.5*  MCV 88.5   < > 92.8   < > 92.3 92.4 90.4 89.2 91.8  PLT 189   < > 194   < > 169 162 163 131* 127*   < > = values in this interval not displayed.    Cardiac Enzymes: No results for input(s): CKTOTAL, CKMB, CKMBINDEX, TROPONINI in the last 168 hours.  BNP: Invalid input(s): POCBNP  CBG: Recent Labs  Lab 10/02/20 0046 10/02/20 0617 10/02/20 1227 10/02/20 2111 10/03/20 0601  GLUCAP 101* 108* 143* 204* 79    Microbiology: Results for orders placed or performed during the hospital encounter of 09/30/20  Resp Panel by RT-PCR (Flu A&B, Covid)     Status: None   Collection Time: 09/30/20  6:54 AM  Result Value Ref Range Status   SARS Coronavirus 2 by RT PCR NEGATIVE NEGATIVE Final    Comment: (NOTE) SARS-CoV-2 target nucleic acids are NOT DETECTED.  The SARS-CoV-2 RNA is generally detectable in upper respiratory specimens  during the acute phase of infection. The lowest concentration of SARS-CoV-2 viral copies this assay can detect is 138 copies/mL. A negative result does not preclude SARS-Cov-2 infection and should not be used as the sole basis for treatment or other patient management decisions. A negative result may occur with  improper specimen collection/handling, submission of specimen other than nasopharyngeal swab, presence of viral mutation(s) within the areas targeted by this assay, and inadequate number of viral copies(<138 copies/mL). A negative result must be combined with clinical observations, patient history, and epidemiological information. The expected result is Negative.  Fact Sheet for Patients:  EntrepreneurPulse.com.au  Fact Sheet for Healthcare Providers:  IncredibleEmployment.be  This test is no t yet approved or cleared by the [place] FDA and  has been authorized for detection and/or diagnosis of SARS-CoV-2 by FDA under an Emergency Use Authorization (EUA). This EUA will remain  in effect (meaning this test can be used) for the duration of the COVID-19 declaration under Section 564(b)(1) of the Act, 21 U.S.C.section 360bbb-3(b)(1), unless the authorization is terminated  or revoked sooner.       Influenza A by PCR NEGATIVE NEGATIVE Final   Influenza B by PCR NEGATIVE NEGATIVE Final    Comment: (NOTE) The Xpert Xpress SARS-CoV-2/FLU/RSV plus assay is intended as an aid in the diagnosis of influenza from Nasopharyngeal swab specimens and should not be used as a sole basis for treatment. Nasal washings and aspirates are unacceptable for Xpert Xpress SARS-CoV-2/FLU/RSV testing.  Fact Sheet for Patients: EntrepreneurPulse.com.au  Fact Sheet for Healthcare Providers: IncredibleEmployment.be  This test is not yet approved or cleared by the [place] FDA and has been authorized for detection  and/or diagnosis of SARS-CoV-2 by FDA under an Emergency Use Authorization (EUA). This EUA will remain in effect (meaning this test can be used) for the duration of the COVID-19 declaration under Section 564(b)(1) of the Act, 21 U.S.C. section 360bbb-3(b)(1), unless the authorization is terminated or revoked.  Performed at [facility], [address] [place], [place] [address]   Culture, body fluid-bottle     Status: None (Preliminary result)   Collection Time: 09/30/20 11:26 AM   Specimen: Ascitic  Result Value Ref Range Status   Specimen Description ASCITIC  Final   Special Requests 10CC  Final   Culture  Final    NO GROWTH 3 DAYS Performed at [facility], [address] [place], [place] [address]    Report Status PENDING  Incomplete  Gram stain     Status: None   Collection Time: 09/30/20 11:26 AM   Specimen: Ascitic  Result Value Ref Range Status   Specimen Description ASCITIC  Final   Special Requests NONE  Final   Gram Stain   Final    SPECIMEN CLOTTED UNABLE TO PERFORM MATTHEWS, B 11.18.2021  Performed at [facility], [address] [place], [place] [address]    Report Status 09/30/2020 FINAL  Final  MRSA PCR Screening     Status: Abnormal   Collection Time: 09/30/20  1:58 PM   Specimen: Nasal Mucosa; Nasopharyngeal  Result Value Ref Range Status   MRSA by PCR POSITIVE (A) NEGATIVE Final    Comment:        The GeneXpert MRSA Assay (FDA approved for NASAL specimens only), is one component of a comprehensive MRSA colonization surveillance program. It is not intended to diagnose MRSA infection nor to guide or monitor treatment for MRSA infections. RESULT CALLED TO, READ BACK BY AND VERIFIED WITH: H EVANS,RN@[id number]  10/01/20 MKELLY Performed at [facility], [address] [place], [place] [address]     Coagulation Studies: No results for input(s): LABPROT, INR in the last 72 hours.  Urinalysis: No results for input(s): COLORURINE, LABSPEC, PHURINE,  GLUCOSEU, HGBUR, BILIRUBINUR, KETONESUR, PROTEINUR, UROBILINOGEN, NITRITE, LEUKOCYTESUR in the last 72 hours.  Invalid input(s): APPERANCEUR    Imaging: IR Fluoro Guide CV Line Right  Result Date: 10/01/2020 INDICATION: END-STAGE RENAL DISEASE, NO CURRENT ACCESS FOR DIALYSIS EXAM: ULTRASOUND FLUOROSCOPIC RIGHT IJ TEMPORARY DIALYSIS CATHETER [facility] CATHETER) MEDICATIONS: 1% lidocaine local ANESTHESIA/SEDATION: None. FLUOROSCOPY TIME:  Fluoroscopy Time: 0 minutes 36 seconds (29.8 mGy). COMPLICATIONS: None immediate. PROCEDURE: Informed written consent was obtained from the patient after a thorough discussion of the procedural risks, benefits and alternatives. All questions were addressed. Maximal Sterile Barrier Technique was utilized including caps, mask, sterile gowns, sterile gloves, sterile drape, hand hygiene and skin antiseptic. A timeout was performed prior to the initiation of the procedure. Under sterile conditions and local anesthesia, ultrasound micropuncture access performed of the right internal jugular vein. Images obtained for documentation of the patent right internal jugular vein. Micro dilator advanced. Amplatz guidewire inserted. Tract dilatation performed to insert a 20 cm her catheter. Tip position SVC RA junction. Images obtained for documentation. Access secured with prolene sutures. Blood aspirated easily followed by saline and heparin flushes. Appropriate volume and strength of heparin instilled in all 3 lumens followed by external caps and a sterile dressing. No immediate complication. Patient tolerated the procedure well. IMPRESSION: Successful ultrasound and fluoroscopic right IJ temporary dialysis catheter (Mahurkar catheter). Access ready for use. Electronically Signed   By: Jerilynn Mages.  Shick M.D.   On: 10/01/2020 16:04   IR US Guide Vasc Access Right  Result Date: 10/01/2020 INDICATION: END-STAGE RENAL DISEASE, NO CURRENT ACCESS FOR DIALYSIS EXAM: ULTRASOUND FLUOROSCOPIC RIGHT  IJ TEMPORARY DIALYSIS CATHETER [facility] CATHETER) MEDICATIONS: 1% lidocaine local ANESTHESIA/SEDATION: None. FLUOROSCOPY TIME:  Fluoroscopy Time: 0 minutes 36 seconds (29.8 mGy). COMPLICATIONS: None immediate. PROCEDURE: Informed written consent was obtained from the patient after a thorough discussion of the procedural risks, benefits and alternatives. All questions were addressed. Maximal Sterile Barrier Technique was utilized including caps, mask, sterile gowns, sterile gloves, sterile drape, hand hygiene and skin antiseptic. A timeout was performed prior to the initiation of the procedure. Under  sterile conditions and local anesthesia, ultrasound micropuncture access performed of the right internal jugular vein. Images obtained for documentation of the patent right internal jugular vein. Micro dilator advanced. Amplatz guidewire inserted. Tract dilatation performed to insert a 20 cm her catheter. Tip position SVC RA junction. Images obtained for documentation. Access secured with prolene sutures. Blood aspirated easily followed by saline and heparin flushes. Appropriate volume and strength of heparin instilled in all 3 lumens followed by external caps and a sterile dressing. No immediate complication. Patient tolerated the procedure well. IMPRESSION: Successful ultrasound and fluoroscopic right IJ temporary dialysis catheter (Mahurkar catheter). Access ready for use. Electronically Signed   By: Jerilynn Mages.  Shick M.D.   On: 10/01/2020 16:04     Medications:   . sodium chloride    . sodium chloride    . sodium chloride    . sodium chloride    . [START ON 10/04/2020] sodium chloride    . ferric gluconate (FERRLECIT/NULECIT) IV    . heparin 1,150 Units/hr (10/03/20 6712)  . nitroGLYCERIN 5 mcg/min (10/02/20 0443)   . amLODipine  10 mg Oral Daily  . ascorbic acid  500 mg Oral Daily  . [START ON 10/04/2020] aspirin  81 mg Oral Pre-Cath  . aspirin EC  81 mg Oral Daily  . atorvastatin  80 mg Oral Daily  .  calcitRIOL  0.5 mcg Oral Daily  . calcium carbonate  1 tablet Oral BID WC  . Chlorhexidine Gluconate Cloth  6 each Topical Daily  . Chlorhexidine Gluconate Cloth  6 each Topical Q0600  . cholecalciferol  1,000 Units Oral Daily  . hydrALAZINE  50 mg Oral TID  . insulin aspart  0-5 Units Subcutaneous QHS  . insulin aspart  0-9 Units Subcutaneous TID WC  . insulin detemir  25 Units Subcutaneous QHS  . levothyroxine  175 mcg Oral QAC breakfast  . metoprolol tartrate  100 mg Oral BID  . multivitamin with minerals  1 tablet Oral Daily  . mupirocin ointment  1 application Nasal BID  . sodium chloride flush  3 mL Intravenous Q12H  . 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   Underwent successful dialysis #2 10/02/2020 we will plan dialysis #3 10/03/2020  Hypertension/volume toxic respiratory failure with large left pleural effusion status post left thoracentesis with 1 L of blood-tinged tinged fluid removed.Marland Kitchen  Appears to be continued to tolerate ultrafiltration with dialysis.  Chest pain transferred from urgent care for cardiology plan for cardiac catheterization 10/04/2020  Hypertension should resolve with ultrafiltration of fluid removal.  We will continue to follow and decrease antihypertensive medications accordingly.  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  Will probably need ESA started.  Iron saturations 11% we will give IV iron  Access: Is temporary dialysis patient with interventional  radiology.  Will need to consult vein and vascular surgery for tunneled dialysis cath and AV fistula.   LOS: 3 Sherril Croon @TODAY @8 :52 AM

## 2020-10-03 NOTE — Progress Notes (Signed)
[facility] for heparin Indication: chest pain/ACS  Allergies  Allergen Reactions  . Zocor [Simvastatin - High Dose] Nausea Only    Unknown    Patient Measurements: Height: 5\' 9"  (175.3 cm) Weight: 102.2 kg (225 lb 5 oz) IBW/kg (Calculated) : 70.7 Heparin Dosing Weight: 94  Vital Signs: Temp: 100.5 F (38.1 C) (11/21 0300) Temp Source: Oral (11/21 0300) BP: 135/56 (11/21 0300) Pulse Rate: 83 (11/21 0300)  Labs: Recent Labs    09/30/20 0854 10/01/20 0417 10/01/20 0458 10/01/20 0911 10/01/20 1811 10/01/20 1932 10/01/20 1932 10/02/20 0340 10/02/20 0340 10/02/20 1331 10/03/20 0425  HGB  --    < >  --   --   --  8.1*   < > 8.3*   < > 7.0* 7.0*  HCT  --    < >  --   --   --  25.4*   < > 25.3*  --  21.5* 22.5*  PLT  --    < >  --   --   --  162   < > 163  --  131* 127*  HEPARINUNFRC  --   --   --   --  0.72*  --   --  0.47  --   --  0.28*  CREATININE  --    < >  --   --   --  5.96*  --   --   --  4.57* 4.35*  TROPONINIHS 194*  --  2,776* 2,891*  --   --   --   --   --   --   --    < > = values in this interval not displayed.    Estimated Creatinine Clearance: 16.2 mL/min (A) (by C-G formula based on SCr of 4.35 mg/dL (H)).  Assessment: Heparin in patient with ACS/NSTEMI.  Patient troponin elevated at 2776. Patient Is not on anticoagulation prior to admission (has been on heparin subq inpatient).  Heparin level came back slightly subtherapeutic at 0.28, on 1150 units/hr. Hgb 7, plt 127. No s/sx of bleeding or infusion issues.   Goal of Therapy:  Heparin level 0.3-0.7 units/ml Monitor platelets by anticoagulation protocol: Yes   Plan:  Increase heparin infusion at 1200 units/hr to get in goal range Monitor daily HL, CBC, and for s/sx of bleeding  Antonietta Jewel, PharmD, [facility] Pharmacist  Phone: [phone] 10/03/2020 7:14 AM  Please check AMION for all [facility] phone numbers After 10:00 PM, call [facility]  [phone]

## 2020-10-03 NOTE — Progress Notes (Signed)
PROGRESS NOTE    [name]  LPF:[id number] DOB: 01-30-1941 DOA: 09/30/2020  PCP: Dettinger, Fransisca Kaufmann, MD    Brief Narrative:   Michael Trevino  is a 79 y.o. male with past medical history relevant for HTN, DM2, CKD 5 with nephrotic syndrome,, hypothyroidism, CAD with prior MI, and HLD and obesity who initially presented on 09/30/2020 to the surgical unit for creation of a left arm AV fistula by vascular surgeon Dr. Curt Jews--- however patient complained of some chest discomfort and shortness of breath, so his procedure was canceled, and patient was sent to the ED for work-up for his shortness of breath -Patient was very short of breath and hypoxic required continuous BiPAP initially, also requiring IV nitro drip for elevated BP and CHF flareup -Patient received IV Lasix 80 mg x 2 with very limited output --Chest x-ray in the ED showed bilateral pleural effusion left more than right --Patient underwent successful ultrasound-guided thoracentesis on 09/30/2020 with removal of 1 L from the left pleural space--- studies pending -After thoracentesis patient was eventually able to come off BiPAP, and transitioned to nasal cannula --- Echo from 53/29/9242 with diastolic dysfunction No fever  Or chills  -No productive cough No nausea, vomiting, diarrhea , denies pleuritic symptoms does have leg swelling In ED hemoglobin is 9.6 which is not far from his recent baseline, WBC 7.7 and platelets 194 --Creatinine is up to 5.5, with a bicarb of 18, potassium is 4.4 and BUN is 66 -LFTs are not elevated -BNP is elevated at 1022 and chest x-ray consistent with CHF with effusions -Troponin is 194 in the setting of renal failure   Assessment & Plan:  HFpEF--patient with acute on chronic diastolic dysfunction CHF exacerbation -echo from 09/30/2020 with EF of 55 to  60%, with severe LVH, small pericardial effusion without significant valvular abnormalities and no significant wall motion  abnormalities ----Chest x-ray on admission with CHF and bilateral pleural effusion left more than right --Patient underwent successful ultrasound-guided thoracentesis on 09/30/2020 with removal of 1 L from the left pleural space--- studies pending -After thoracentesis patient was eventually able to come off BiPAP, and transitioned to nasal cannula --BNP is elevated at 1022 ,-Troponin is 194 in the setting of renal failure -Continue to dose high-dose IV Lasix per nephrology service -Patient will need to initiate hemodialysis to address his volume status. -Pt is on 4 L [place] and notes that his legs are less swollen .  Intake/Output Summary (Last 24 hours) at 10/03/2020 0734 Last data filed at 10/03/2020 6834 Gross per 24 hour  Intake 240 ml  Output 3523 ml  Net -3283 ml   --pt seen today he is alert awake and oriented and cardiology dr.lambert at bedside with plan for LHC in am and we will make pt npo after midnight.    Chronic anemia of CKD-- In ED hemoglobin is 9.6 which is not far from his recent baseline--no evidence of ongoing bleeding, -EPO/ESA agent per nephrology service. -Hb is 8.3 we will follow.     CKD IV/ESRD / HD-- --Creatinine is up to 5.5, with a bicarb of 18, potassium is 4.4 and BUN is 66 -Patient is clearly volume overloaded clinically and radiologically -Abdomen seizures above #1 --Timing of initiation of hemodialysis per nephrology service. -greatly appreciate consult . Lab Results  Component Value Date        CREATININE 4.57 (H) 10/02/2020   CREATININE 5.96 (H) 10/01/2020   --pt seen today and creatinine is stable. He is tolerating dialysis  and further rec per nephrology.     HTN--much improved with IV nitro, okay to transition off IV nitro to p.o. amlodipine 10 mg daily and hydralazine 50 mg 3 times daily -Use IV labetalol as needed persistent elevated BP once transitioned off IV nitro Blood pressure 114/85, pulse 92, temperature (!) 100.5 F (38.1 C),  temperature source Oral, resp. rate 20, height 5\' 9"  (1.753 m), weight 102.2 kg, SpO2 94 %. prn hydralazine.  Blood pressure (!) 151/59, pulse 80, temperature 98 F (36.7 C), temperature source Oral, resp. rate 18, height 5\' 9"  (1.753 m), weight 100.1 kg, SpO2 98 %. PRN hydralazine.   Acute hypoxic respiratory failure--- in the setting of volume overload in a patient with worsening CKD 5, as well as acute on chronic diastolic CHF exacerbation with bilateral left more than right pleural effusions--oxygenation improved after left-sided thoracentesis with removal of 1 L -Continue supplemental oxygen, patient has been able to come off BiPAP -Anticipate improvement in oxygenation with improvement in CHF and after initiation of hemodialysis to address volume status. -SpO2: 94 % O2 Flow Rate (L/min): 10 L/min -VBG pending. -Echo results as follow: IMPRESSION: 1. Left ventricular ejection fraction, by estimation, is 55 to 60%. The  left ventricle has normal function. The left ventricle has no regional  wall motion abnormalities. There is severe left ventricular hypertrophy.  Left ventricular diastolic parameters  are indeterminate. Elevated left atrial pressure.  2. Right ventricular systolic function is normal. The right ventricular  size is normal.  3. A small pericardial effusion is present. The pericardial effusion is  circumferential. There is no evidence of cardiac tamponade. Large pleural  effusion in the left lateral region.  4. The mitral valve is normal in structure. Trivial mitral valve  regurgitation. No evidence of mitral stenosis.  5. The aortic valve was not well visualized. There is mild calcification  of the aortic valve. There is mild thickening of the aortic valve. Aortic  valve regurgitation is moderate. Moderate aortic valve stenosis.  6. The inferior vena cava is normal in size with greater than 50%  respiratory variability, suggesting right atrial pressure of 3  mmHg.  VBg shows hypoxia. Will get bipap at bedside.  -- SpO2: 98 % O2 Flow Rate (L/min): 10 L/min No change in oxygen requirement we will continue to follow pt still reports sob and I expect will improve once he is euvolemic.   CAD/HLD--continue Lipitor, aspirin, and metoprolol -troponin as above #1 -Currently chest pain-free. 10/02/20 Continue Lipitor, aspirin,metoprolol, troponin. Results for ROONEY, SWAILS (MRN [id number]) as of 10/02/2020 08:48  Ref. Range 10/02/2020 03:40  Total CHOL/HDL Ratio Latest Units: RATIO 3.8  Cholesterol Latest Ref Range: 0 - 200 mg/dL 147  HDL Cholesterol Latest Ref Range: >40 mg/dL 39 (L)  LDL (calc) Latest Ref Range: 0 - 99 mg/dL 93  Triglycerides Latest Ref Range: <150 mg/dL 74  VLDL Latest Ref Range: 0 - 40 mg/dL 15  LDL is not at goal and neither is hdl. Pt will need to change to Crestor on discharge and we will increase Lipitor for now.    DM2-A1c 6.7 reflecting excellent diabetic control PTA -Use Novolog/Humalog Sliding scale insulin with Accu-Cheks/Fingersticks as ordered.   Hypothyroidism--continue levothyroxine 175 mcg daily. 10/02/20 Continue levothyroxine at 175 mcg daily. Patient to be taken on an empty stomach in the morning. Thyroid function test pending.  DVT prophylaxis:  heparin  Disposition Plan: TBD  Consultants:  Cardiology Nephrology  Procedures:  none  Subjective/Overview: Pt admitted with chf  exacerbation ,currently stable on 4L [place]. Pt admitted on the 18th, seen by cardiology and started on heparin drip. Plan per cardiology is for left heart cath once dialysis access is established. Pt seen by nephrology Dr.webb and Pt had HD access placed on 10/01/20 by IR. Pt had HD with 1L removed yesterday.pt is alert,awake and oriented.   Objective: Vitals:   10/02/20 2300 10/03/20 0300 10/03/20 0302 10/03/20 0715  BP: (!) 112/48 (!) 135/56  114/85  Pulse: 79 83  92  Resp: (!) 22 (!) 24 17 20   Temp: 98.9 F (37.2  C) (!) 100.5 F (38.1 C)    TempSrc: Oral Oral    SpO2: 95% 94%  94%  Weight:  102.2 kg    Height:       SpO2: 94 % O2 Flow Rate (L/min): 10 L/min  Intake/Output Summary (Last 24 hours) at 10/03/2020 0734 Last data filed at 10/03/2020 0332 Gross per 24 hour  Intake 240 ml  Output 3523 ml  Net -3283 ml   Filed Weights   10/02/20 1615 10/02/20 1904 10/03/20 0300  Weight: 103.8 kg 101 kg 102.2 kg    Examination: Blood pressure 114/85, pulse 92, temperature (!) 100.5 F (38.1 C), temperature source Oral, resp. rate 20, height 5\' 9"  (1.753 m), weight 102.2 kg, SpO2 94 %. General exam: Appears calm and comfortable  HEENT:EOMI, perrl  Respiratory system: Clear to auscultation. Respiratory effort normal. Cardiovascular system: S1 & S2 heard, RRR. No JVD, murmurs, rubs, gallops or clicks. No pedal edema. Gastrointestinal system: Abdomen is nondistended, soft and nontender. No organomegaly or masses felt. Normal bowel sounds heard. Central nervous system: Alert and oriented.CN grossly intact No focal neurological deficits. Extremities: pt moving all 4 ext and ambulating. Skin: No rashes, lesions or ulcers Psychiatry: Judgement and insight appear normal. Mood & affect appropriate.     Data Reviewed: I have personally reviewed following labs and imaging studies  Labs  No results for input(s): CKTOTAL, CKMB, TROPONINI in the last 72 hours. Lab Results  Component Value Date   WBC 5.1 10/03/2020   HGB 7.0 (L) 10/03/2020   HCT 22.5 (L) 10/03/2020   MCV 91.8 10/03/2020   PLT 127 (L) 10/03/2020    Recent Labs  Lab 09/30/20 0654 10/01/20 0417 10/03/20 0425  NA 142   < > 140  K 4.4   < > 4.0  CL 113*   < > 105  CO2 18*   < > 25  BUN 66*   < > 39*  CREATININE 5.56*   < > 4.35*  CALCIUM 8.2*   < > 7.3*  PROT 7.7  --   --   BILITOT 0.5  --   --   ALKPHOS 58  --   --   ALT 16  --   --   AST 15  --   --   GLUCOSE 141*   < > 87   < > = values in this interval not displayed.    Lab Results  Component Value Date   CHOL 147 10/02/2020   HDL 39 (L) 10/02/2020   LDLCALC 93 10/02/2020   TRIG 74 10/02/2020   Lab Results  Component Value Date   DDIMER 2.70 (H) 09/23/2019   Invalid input(s): POCBNP  Radiology Studies: IR Fluoro Guide CV Line Right  Result Date: 10/01/2020 INDICATION: END-STAGE RENAL DISEASE, NO CURRENT ACCESS FOR DIALYSIS EXAM: ULTRASOUND FLUOROSCOPIC RIGHT IJ TEMPORARY DIALYSIS CATHETER [facility] CATHETER) MEDICATIONS: 1% lidocaine local ANESTHESIA/SEDATION: None. FLUOROSCOPY  TIME:  Fluoroscopy Time: 0 minutes 36 seconds (29.8 mGy). COMPLICATIONS: None immediate. PROCEDURE: Informed written consent was obtained from the patient after a thorough discussion of the procedural risks, benefits and alternatives. All questions were addressed. Maximal Sterile Barrier Technique was utilized including caps, mask, sterile gowns, sterile gloves, sterile drape, hand hygiene and skin antiseptic. A timeout was performed prior to the initiation of the procedure. Under sterile conditions and local anesthesia, ultrasound micropuncture access performed of the right internal jugular vein. Images obtained for documentation of the patent right internal jugular vein. Micro dilator advanced. Amplatz guidewire inserted. Tract dilatation performed to insert a 20 cm her catheter. Tip position SVC RA junction. Images obtained for documentation. Access secured with prolene sutures. Blood aspirated easily followed by saline and heparin flushes. Appropriate volume and strength of heparin instilled in all 3 lumens followed by external caps and a sterile dressing. No immediate complication. Patient tolerated the procedure well. IMPRESSION: Successful ultrasound and fluoroscopic right IJ temporary dialysis catheter (Mahurkar catheter). Access ready for use. Electronically Signed   By: Jerilynn Mages.  Shick M.D.   On: 10/01/2020 16:04   IR US Guide Vasc Access Right  Result Date:  10/01/2020 INDICATION: END-STAGE RENAL DISEASE, NO CURRENT ACCESS FOR DIALYSIS EXAM: ULTRASOUND FLUOROSCOPIC RIGHT IJ TEMPORARY DIALYSIS CATHETER [facility] CATHETER) MEDICATIONS: 1% lidocaine local ANESTHESIA/SEDATION: None. FLUOROSCOPY TIME:  Fluoroscopy Time: 0 minutes 36 seconds (29.8 mGy). COMPLICATIONS: None immediate. PROCEDURE: Informed written consent was obtained from the patient after a thorough discussion of the procedural risks, benefits and alternatives. All questions were addressed. Maximal Sterile Barrier Technique was utilized including caps, mask, sterile gowns, sterile gloves, sterile drape, hand hygiene and skin antiseptic. A timeout was performed prior to the initiation of the procedure. Under sterile conditions and local anesthesia, ultrasound micropuncture access performed of the right internal jugular vein. Images obtained for documentation of the patent right internal jugular vein. Micro dilator advanced. Amplatz guidewire inserted. Tract dilatation performed to insert a 20 cm her catheter. Tip position SVC RA junction. Images obtained for documentation. Access secured with prolene sutures. Blood aspirated easily followed by saline and heparin flushes. Appropriate volume and strength of heparin instilled in all 3 lumens followed by external caps and a sterile dressing. No immediate complication. Patient tolerated the procedure well. IMPRESSION: Successful ultrasound and fluoroscopic right IJ temporary dialysis catheter (Mahurkar catheter). Access ready for use. Electronically Signed   By: Jerilynn Mages.  Shick M.D.   On: 10/01/2020 16:04       Anti-infectives (From admission, onward)   None       Continuous Infusions: . sodium chloride    . sodium chloride    . sodium chloride    . sodium chloride    . [START ON 10/04/2020] sodium chloride    . ferric gluconate (FERRLECIT/NULECIT) IV    . heparin 1,150 Units/hr (10/03/20 0814)  . nitroGLYCERIN 5 mcg/min (10/02/20 0443)     LOS: 3  days    Para Skeans, MD [facility] Pager [phone] How to contact the [facility] Attending or Consulting provider [place] or covering provider during after hours [place], for this patient?    1. Check the care team in [facility] and look for a) attending/consulting [facility] provider listed and b) the [facility] team listed 2. Log into [email or web address] and use [place]'s universal password to access. If you do not have the password, please contact the hospital operator. 3. Locate the [facility] provider you are looking for under [facility] and page  to a number that you can be directly reached. 4. If you still have difficulty reaching the provider, please page the [facility] (Director on Call) for the Hospitalists listed on amion for assistance. [email or web address] Password [facility] 10/03/2020, 7:34 AM

## 2020-10-03 NOTE — Progress Notes (Signed)
Progress Note  Patient Name: Michael Trevino Date of Encounter: 10/03/2020  Primary Cardiologist: Minus Breeding, MD   Subjective   NAEO. HD session yesterday with 3L removed. Tolerated the session well. Some confusion overnight.  Now alert and oriented x3.   No complaints this AM.    Inpatient Medications    Scheduled Meds: . amLODipine  10 mg Oral Daily  . ascorbic acid  500 mg Oral Daily  . [START ON 10/04/2020] aspirin  81 mg Oral Pre-Cath  . aspirin EC  81 mg Oral Daily  . atorvastatin  80 mg Oral Daily  . calcitRIOL  0.5 mcg Oral Daily  . calcium carbonate  1 tablet Oral BID WC  . Chlorhexidine Gluconate Cloth  6 each Topical Daily  . Chlorhexidine Gluconate Cloth  6 each Topical Q0600  . Chlorhexidine Gluconate Cloth  6 each Topical Q0600  . cholecalciferol  1,000 Units Oral Daily  . hydrALAZINE  50 mg Oral TID  . insulin aspart  0-5 Units Subcutaneous QHS  . insulin aspart  0-9 Units Subcutaneous TID WC  . insulin detemir  25 Units Subcutaneous QHS  . levothyroxine  175 mcg Oral QAC breakfast  . metoprolol tartrate  100 mg Oral BID  . multivitamin with minerals  1 tablet Oral Daily  . mupirocin ointment  1 application Nasal BID  . sodium chloride flush  3 mL Intravenous Q12H  . sodium chloride flush  3 mL Intravenous Q12H   Continuous Infusions: . sodium chloride    . sodium chloride    . sodium chloride    . sodium chloride    . [START ON 10/04/2020] sodium chloride    . ferric gluconate (FERRLECIT/NULECIT) IV    . heparin 1,150 Units/hr (10/03/20 4166)  . iron sucrose    . nitroGLYCERIN 5 mcg/min (10/02/20 0443)   PRN Meds: sodium chloride, sodium chloride, sodium chloride, sodium chloride, acetaminophen **OR** acetaminophen, alteplase, bisacodyl, heparin, lidocaine (PF), lidocaine, lidocaine-prilocaine, nitroGLYCERIN, ondansetron **OR** ondansetron (ZOFRAN) IV, pentafluoroprop-tetrafluoroeth, polyethylene glycol, sodium chloride flush, sodium chloride  flush, traZODone   Vital Signs    Vitals:   10/02/20 2300 10/03/20 0300 10/03/20 0302 10/03/20 0715  BP: (!) 112/48 (!) 135/56  114/85  Pulse: 79 83  92  Resp: (!) 22 (!) 24 17 20   Temp: 98.9 F (37.2 C) (!) 100.5 F (38.1 C)  99.9 F (37.7 C)  TempSrc: Oral Oral  Oral  SpO2: 95% 94%  94%  Weight:  102.2 kg    Height:        Intake/Output Summary (Last 24 hours) at 10/03/2020 0903 Last data filed at 10/03/2020 0800 Gross per 24 hour  Intake 360 ml  Output 3523 ml  Net -3163 ml   Filed Weights   10/02/20 1615 10/02/20 1904 10/03/20 0300  Weight: 103.8 kg 101 kg 102.2 kg    Telemetry    Sinus rhythm.  - Personally Reviewed  ECG    No new  Physical Exam   GEN: No acute distress.  At 30 degrees resting comfortably. Neck: R tunneled HD catheter. Cardiac: RRR, no murmurs, rubs, or gallops. 1+ pitting edema to the knees. Respiratory: Clear to auscultation bilaterally. No IWOB. GI: Soft, nontender, non-distended  MS: No edema; No deformity. Neuro:  Nonfocal  Psych: Normal affect   Labs    Chemistry Recent Labs  Lab 09/30/20 0654 10/01/20 0417 10/01/20 1932 10/02/20 1331 10/03/20 0425  NA 142   < > 141 136 140  K 4.4   < >  4.4 3.8 4.0  CL 113*   < > 110 104 105  CO2 18*   < > 18* 22 25  GLUCOSE 141*   < > 137* 177* 87  BUN 66*   < > 71* 47* 39*  CREATININE 5.56*   < > 5.96* 4.57* 4.35*  CALCIUM 8.2*   < > 7.9* 7.3* 7.3*  PROT 7.7  --   --   --   --   ALBUMIN 3.4*  --  2.4* 2.1* 2.1*  AST 15  --   --   --   --   ALT 16  --   --   --   --   ALKPHOS 58  --   --   --   --   BILITOT 0.5  --   --   --   --   GFRNONAA 10*   < > 9* 12* 13*  ANIONGAP 11   < > 13 10 10    < > = values in this interval not displayed.     Hematology Recent Labs  Lab 10/02/20 0340 10/02/20 1331 10/03/20 0425  WBC 6.3 5.0 5.1  RBC 2.80* 2.41* 2.45*  HGB 8.3* 7.0* 7.0*  HCT 25.3* 21.5* 22.5*  MCV 90.4 89.2 91.8  MCH 29.6 29.0 28.6  MCHC 32.8 32.6 31.1  RDW 14.3 14.1  13.9  PLT 163 131* 127*    Cardiac EnzymesNo results for input(s): TROPONINI in the last 168 hours. No results for input(s): TROPIPOC in the last 168 hours.   BNP Recent Labs  Lab 09/30/20 0654  BNP 1,022.0*     DDimer No results for input(s): DDIMER in the last 168 hours.   Radiology    IR Fluoro Guide CV Line Right  Result Date: 10/01/2020 INDICATION: END-STAGE RENAL DISEASE, NO CURRENT ACCESS FOR DIALYSIS EXAM: ULTRASOUND FLUOROSCOPIC RIGHT IJ TEMPORARY DIALYSIS CATHETER [facility] CATHETER) MEDICATIONS: 1% lidocaine local ANESTHESIA/SEDATION: None. FLUOROSCOPY TIME:  Fluoroscopy Time: 0 minutes 36 seconds (29.8 mGy). COMPLICATIONS: None immediate. PROCEDURE: Informed written consent was obtained from the patient after a thorough discussion of the procedural risks, benefits and alternatives. All questions were addressed. Maximal Sterile Barrier Technique was utilized including caps, mask, sterile gowns, sterile gloves, sterile drape, hand hygiene and skin antiseptic. A timeout was performed prior to the initiation of the procedure. Under sterile conditions and local anesthesia, ultrasound micropuncture access performed of the right internal jugular vein. Images obtained for documentation of the patent right internal jugular vein. Micro dilator advanced. Amplatz guidewire inserted. Tract dilatation performed to insert a 20 cm her catheter. Tip position SVC RA junction. Images obtained for documentation. Access secured with prolene sutures. Blood aspirated easily followed by saline and heparin flushes. Appropriate volume and strength of heparin instilled in all 3 lumens followed by external caps and a sterile dressing. No immediate complication. Patient tolerated the procedure well. IMPRESSION: Successful ultrasound and fluoroscopic right IJ temporary dialysis catheter (Mahurkar catheter). Access ready for use. Electronically Signed   By: Jerilynn Mages.  Shick M.D.   On: 10/01/2020 16:04   IR US Guide Vasc  Access Right  Result Date: 10/01/2020 INDICATION: END-STAGE RENAL DISEASE, NO CURRENT ACCESS FOR DIALYSIS EXAM: ULTRASOUND FLUOROSCOPIC RIGHT IJ TEMPORARY DIALYSIS CATHETER [facility] CATHETER) MEDICATIONS: 1% lidocaine local ANESTHESIA/SEDATION: None. FLUOROSCOPY TIME:  Fluoroscopy Time: 0 minutes 36 seconds (29.8 mGy). COMPLICATIONS: None immediate. PROCEDURE: Informed written consent was obtained from the patient after a thorough discussion of the procedural risks, benefits and alternatives. All questions were addressed. Maximal  Sterile Barrier Technique was utilized including caps, mask, sterile gowns, sterile gloves, sterile drape, hand hygiene and skin antiseptic. A timeout was performed prior to the initiation of the procedure. Under sterile conditions and local anesthesia, ultrasound micropuncture access performed of the right internal jugular vein. Images obtained for documentation of the patent right internal jugular vein. Micro dilator advanced. Amplatz guidewire inserted. Tract dilatation performed to insert a 20 cm her catheter. Tip position SVC RA junction. Images obtained for documentation. Access secured with prolene sutures. Blood aspirated easily followed by saline and heparin flushes. Appropriate volume and strength of heparin instilled in all 3 lumens followed by external caps and a sterile dressing. No immediate complication. Patient tolerated the procedure well. IMPRESSION: Successful ultrasound and fluoroscopic right IJ temporary dialysis catheter (Mahurkar catheter). Access ready for use. Electronically Signed   By: Jerilynn Mages.  Shick M.D.   On: 10/01/2020 16:04    Cardiac Studies   09/30/2020 Echo personally reviewed LV function normal, 55% Severe LVH RV normal Small PEff, circumferential Trivial M Mild thickening of AV, moderate AR  Patient Profile     79 y.o. male with known CAD admitted with NSTEMI. EF remains normal on TTE this admission. Will plan for LHC, possibly Monday, now  that he has been started on iHD.   Assessment & Plan    1. NSTEMI Continue ASA, Atorva 80, Metoprolol Continue heparin gtt - plan for [facility] Monday to assess his CAD - NPO Sunday night  2. 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      Signed, Lars Mage, MD  10/03/2020, 9:03 AM

## 2020-10-04 ENCOUNTER — Other Ambulatory Visit: Payer: Self-pay | Admitting: Family Medicine

## 2020-10-04 ENCOUNTER — Encounter (HOSPITAL_COMMUNITY): Payer: Self-pay | Admitting: Cardiovascular Disease

## 2020-10-04 ENCOUNTER — Encounter (HOSPITAL_COMMUNITY)
Admission: EM | Disposition: A | Discharge status: Skilled Nursing Facility | Payer: Self-pay | Source: Home / Self Care | Attending: Internal Medicine

## 2020-10-04 DIAGNOSIS — I214 Non-ST elevation (NSTEMI) myocardial infarction: Secondary | ICD-10-CM | POA: Diagnosis not present

## 2020-10-04 DIAGNOSIS — I2510 Atherosclerotic heart disease of native coronary artery without angina pectoris: Secondary | ICD-10-CM | POA: Diagnosis not present

## 2020-10-04 DIAGNOSIS — I5033 Acute on chronic diastolic (congestive) heart failure: Secondary | ICD-10-CM | POA: Diagnosis not present

## 2020-10-04 DIAGNOSIS — D631 Anemia in chronic kidney disease: Secondary | ICD-10-CM

## 2020-10-04 DIAGNOSIS — Z992 Dependence on renal dialysis: Secondary | ICD-10-CM

## 2020-10-04 DIAGNOSIS — I509 Heart failure, unspecified: Secondary | ICD-10-CM

## 2020-10-04 DIAGNOSIS — I10 Essential (primary) hypertension: Secondary | ICD-10-CM | POA: Diagnosis not present

## 2020-10-04 DIAGNOSIS — N186 End stage renal disease: Secondary | ICD-10-CM | POA: Diagnosis not present

## 2020-10-04 DIAGNOSIS — N185 Chronic kidney disease, stage 5: Secondary | ICD-10-CM | POA: Diagnosis not present

## 2020-10-04 HISTORY — PX: LEFT HEART CATH AND CORONARY ANGIOGRAPHY: CATH118249

## 2020-10-04 LAB — GLUCOSE, CAPILLARY
Glucose-Capillary: 130 mg/dL — ABNORMAL HIGH (ref 70–99)
Glucose-Capillary: 92 mg/dL (ref 70–99)
Glucose-Capillary: 84 mg/dL (ref 70–99)
Glucose-Capillary: 190 mg/dL — ABNORMAL HIGH (ref 70–99)
Glucose-Capillary: 150 mg/dL — ABNORMAL HIGH (ref 70–99)
Glucose-Capillary: 108 mg/dL — ABNORMAL HIGH (ref 70–99)
Glucose-Capillary: 206 mg/dL — ABNORMAL HIGH (ref 70–99)

## 2020-10-04 LAB — HEPARIN LEVEL (UNFRACTIONATED): Heparin Unfractionated: 0.36 IU/mL (ref 0.30–0.70)

## 2020-10-04 LAB — RENAL FUNCTION PANEL
Albumin: 2.2 g/dL — ABNORMAL LOW (ref 3.5–5.0)
Anion gap: 12 (ref 5–15)
BUN: 31 mg/dL — ABNORMAL HIGH (ref 8–23)
CO2: 27 mmol/L (ref 22–32)
Calcium: 7.6 mg/dL — ABNORMAL LOW (ref 8.9–10.3)
Chloride: 99 mmol/L (ref 98–111)
Creatinine, Ser: 4.48 mg/dL — ABNORMAL HIGH (ref 0.61–1.24)
GFR, Estimated: 13 mL/min — ABNORMAL LOW (ref >60)
Glucose, Bld: 88 mg/dL (ref 70–99)
Phosphorus: 4.5 mg/dL (ref 2.5–4.6)
Potassium: 3.9 mmol/L (ref 3.5–5.1)
Sodium: 138 mmol/L (ref 135–145)

## 2020-10-04 LAB — PARATHYROID HORMONE, INTACT (NO CA): PTH: 93 pg/mL — ABNORMAL HIGH (ref 15–65)

## 2020-10-04 LAB — CBC
HCT: 23.4 % — ABNORMAL LOW (ref 39.0–52.0)
Hemoglobin: 7.4 g/dL — ABNORMAL LOW (ref 13.0–17.0)
MCH: 29 pg (ref 26.0–34.0)
MCHC: 31.6 g/dL (ref 30.0–36.0)
MCV: 91.8 fL (ref 80.0–100.0)
Platelets: 136 10*3/uL — ABNORMAL LOW (ref 150–400)
RBC: 2.55 MIL/uL — ABNORMAL LOW (ref 4.22–5.81)
RDW: 13.8 % (ref 11.5–15.5)
WBC: 5.7 10*3/uL (ref 4.0–10.5)
nRBC: 0 % (ref 0.0–0.2)

## 2020-10-04 LAB — HEPATITIS B CORE ANTIBODY, TOTAL: Hep B Core Total Ab: NONREACTIVE

## 2020-10-04 SURGERY — LEFT HEART CATH AND CORONARY ANGIOGRAPHY
Anesthesia: LOCAL

## 2020-10-04 MED ORDER — CHLORHEXIDINE GLUCONATE CLOTH 2 % EX PADS
6.0000 | MEDICATED_PAD | Freq: Every day | CUTANEOUS | Status: DC
  Administered 2020-10-05 – 2020-10-06 (×2): 6 via TOPICAL

## 2020-10-04 MED ORDER — ONDANSETRON HCL 4 MG/2ML IJ SOLN
4.0000 mg | Freq: Four times a day (QID) | INTRAMUSCULAR | Status: DC | PRN
  Administered 2020-10-12: 4 mg via INTRAVENOUS
  Filled 2020-10-04: qty 2

## 2020-10-04 MED ORDER — SODIUM CHLORIDE 0.9% FLUSH: 3.0000 mL | INTRAVENOUS | Status: DC | PRN

## 2020-10-04 MED ORDER — MORPHINE SULFATE (PF) 2 MG/ML IV SOLN: 2.0000 mg | INTRAVENOUS | Status: DC | PRN

## 2020-10-04 MED ORDER — MIDAZOLAM HCL 2 MG/2ML IJ SOLN
INTRAMUSCULAR | Status: AC
  Filled 2020-10-04: qty 2

## 2020-10-04 MED ORDER — HEPARIN (PORCINE) IN NACL 1000-0.9 UT/500ML-% IV SOLN
INTRAVENOUS | Status: AC
  Filled 2020-10-04: qty 1000

## 2020-10-04 MED ORDER — MIDAZOLAM HCL 2 MG/2ML IJ SOLN
INTRAMUSCULAR | Status: DC | PRN
  Administered 2020-10-04: 0.5 mg via INTRAVENOUS

## 2020-10-04 MED ORDER — SODIUM CHLORIDE 0.9 % IV SOLN: INTRAVENOUS | Status: AC

## 2020-10-04 MED ORDER — HEPARIN (PORCINE) IN NACL 1000-0.9 UT/500ML-% IV SOLN
INTRAVENOUS | Status: DC | PRN
  Administered 2020-10-04 (×2): 500 mL

## 2020-10-04 MED ORDER — LIDOCAINE HCL (PF) 1 % IJ SOLN
INTRAMUSCULAR | Status: AC
  Filled 2020-10-04: qty 30

## 2020-10-04 MED ORDER — KIDNEY FAILURE BOOK: Freq: Once | Status: AC

## 2020-10-04 MED ORDER — SODIUM CHLORIDE 0.9 % IV SOLN: 250.0000 mL | INTRAVENOUS | Status: DC | PRN

## 2020-10-04 MED ORDER — ASPIRIN 81 MG PO CHEW
81.0000 mg | CHEWABLE_TABLET | Freq: Every day | ORAL | Status: DC
  Administered 2020-10-06 – 2020-10-13 (×8): 81 mg via ORAL
  Filled 2020-10-04 (×9): qty 1

## 2020-10-04 MED ORDER — SODIUM CHLORIDE 0.9% FLUSH
3.0000 mL | Freq: Two times a day (BID) | INTRAVENOUS | Status: DC
  Administered 2020-10-04 – 2020-10-12 (×13): 3 mL via INTRAVENOUS

## 2020-10-04 MED ORDER — HYDRALAZINE HCL 20 MG/ML IJ SOLN: 10.0000 mg | INTRAMUSCULAR | Status: AC | PRN

## 2020-10-04 MED ORDER — HEPARIN SODIUM (PORCINE) 5000 UNIT/ML IJ SOLN
5000.0000 [IU] | Freq: Three times a day (TID) | INTRAMUSCULAR | Status: DC
  Administered 2020-10-04 – 2020-10-13 (×23): 5000 [IU] via SUBCUTANEOUS
  Filled 2020-10-04 (×24): qty 1

## 2020-10-04 MED ORDER — ACETAMINOPHEN 325 MG PO TABS: 650.0000 mg | ORAL_TABLET | ORAL | Status: DC | PRN

## 2020-10-04 MED ORDER — IOHEXOL 350 MG/ML SOLN
INTRAVENOUS | Status: DC | PRN
  Administered 2020-10-04: 50 mL

## 2020-10-04 MED ORDER — LIDOCAINE HCL (PF) 1 % IJ SOLN
INTRAMUSCULAR | Status: DC | PRN
  Administered 2020-10-04: 28 mL

## 2020-10-04 MED ORDER — LABETALOL HCL 5 MG/ML IV SOLN: 10.0000 mg | INTRAVENOUS | Status: AC | PRN

## 2020-10-04 MED ORDER — FENTANYL CITRATE (PF) 100 MCG/2ML IJ SOLN
INTRAMUSCULAR | Status: DC | PRN
  Administered 2020-10-04: 25 ug via INTRAVENOUS

## 2020-10-04 MED ORDER — FENTANYL CITRATE (PF) 100 MCG/2ML IJ SOLN
INTRAMUSCULAR | Status: AC
  Filled 2020-10-04: qty 2

## 2020-10-04 SURGICAL SUPPLY — 11 items
CATH INFINITI 5FR JL5 (CATHETERS) ×1 IMPLANT
CATH INFINITI 5FR MULTPACK ANG (CATHETERS) ×1 IMPLANT
CLOSURE MYNX CONTROL 5F (Vascular Products) ×1 IMPLANT
KIT HEART LEFT (KITS) ×2 IMPLANT
PACK CARDIAC CATHETERIZATION (CUSTOM PROCEDURE TRAY) ×2 IMPLANT
SHEATH PINNACLE 5F 10CM (SHEATH) ×1 IMPLANT
SHEATH PROBE COVER 6X72 (BAG) ×1 IMPLANT
SYR MEDRAD MARK 7 150ML (SYRINGE) ×2 IMPLANT
TRANSDUCER W/STOPCOCK (MISCELLANEOUS) ×2 IMPLANT
TUBING CIL FLEX 10 FLL-RA (TUBING) ×2 IMPLANT
WIRE EMERALD 3MM-J .035X150CM (WIRE) ×1 IMPLANT

## 2020-10-04 NOTE — Progress Notes (Addendum)
[facility] Progress Note  Name: Michael Trevino MRN: [id number] DOB: 1941/09/05  Subjective:  Had last HD on 11/21 with 3kg UF charted.  Strict ins/outs are not available for 11/21 however had 500 and 1.9 liters charted the two preceding days.  He's ok with [place] calling today [facility] for access placement  Review of systems:  Denies shortness of breath - states breathing is much better Denies current chest pain  Denies n/v/d   Intake/Output Summary (Last 24 hours) at 10/04/2020 0638 Last data filed at 10/04/2020 0272 Gross per 24 hour  Intake 1184.94 ml  Output 3000 ml  Net -1815.06 ml    Vitals:  Vitals:   10/03/20 2343 10/04/20 0000 10/04/20 0100 10/04/20 0400  BP:  (!) 145/57 (!) 124/44 (!) 113/48  Pulse:  84 73 72  Resp:  19 20 20   Temp: (!) 100.7 F (38.2 C)   98.6 F (37 C)  TempSrc: Oral   Oral  SpO2:  96% 99% 98%  Weight:    99.6 kg  Height:         Physical Exam:  General adult male in bed in no acute distress HEENT normocephalic atraumatic extraocular movements intact sclera anicteric Neck supple trachea midline Lungs clear to auscultation bilaterally normal work of breathing at rest  Heart S1S2 no rub appreciated Abdomen soft nontender obese habitus  Extremities trace edema lower extremities Psych normal mood and affect Neuro alert and oriented provides hx and follows commands Access RIJ nontunneled catheter   Medications reviewed   Labs:  BMP Latest Ref Rng & Units 10/03/2020 10/02/2020 10/01/2020  Glucose 70 - 99 mg/dL 87 177(H) 137(H)  BUN 8 - 23 mg/dL 39(H) 47(H) 71(H)  Creatinine 0.61 - 1.24 mg/dL 4.35(H) 4.57(H) 5.96(H)  BUN/Creat Ratio 10 - 24 - - -  Sodium 135 - 145 mmol/L 140 136 141  Potassium 3.5 - 5.1 mmol/L 4.0 3.8 4.4  Chloride 98 - 111 mmol/L 105 104 110  CO2 22 - 32 mmol/L 25 22 18(L)  Calcium 8.9 - 10.3 mg/dL 7.3(L) 7.3(L) 7.9(L)     Assessment/Plan:    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  had third  inpatient HD tx on 11/21.    Will contact renal navigator/CLIP  Will consult VVS for tunneled catheter and permanent access  Have ordered strict ins/outs.  Plan next HD on 11/23  Hypertension/volume toxic respiratory failure with large left pleural effusion status  Left Pleural effusion - s/p left thoracentesis with 1 L of blood-tinged tinged fluid removed. 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  Iron saturations 11% - note IV iron ordered 11/21 but never given. Is in place for 11/22  dispo - He does not yet have permanent access, tunneled catheter, or an outpatient HD unit - continue inpatient monitoring  Claudia Desanctis, MD 10/04/2020 6:54 AM

## 2020-10-04 NOTE — Progress Notes (Signed)
Progress Note  Patient Name: Michael Trevino Date of Encounter: 10/04/2020  Primary Cardiologist:   Minus Breeding, MD   Subjective   No chest pain .  No SOB. Post cath.   Inpatient Medications    Scheduled Meds:  amLODipine  10 mg Oral Daily   ascorbic acid  500 mg Oral Daily   aspirin  81 mg Oral Daily   atorvastatin  80 mg Oral Daily   calcitRIOL  0.5 mcg Oral Daily   calcium carbonate  1 tablet Oral BID WC   Chlorhexidine Gluconate Cloth  6 each Topical Daily   Chlorhexidine Gluconate Cloth  6 each Topical Q0600   Chlorhexidine Gluconate Cloth  6 each Topical Q0600   cholecalciferol  1,000 Units Oral Daily   hydrALAZINE  50 mg Oral TID   insulin aspart  0-5 Units Subcutaneous QHS   insulin aspart  0-9 Units Subcutaneous TID WC   insulin detemir  25 Units Subcutaneous QHS   levothyroxine  175 mcg Oral QAC breakfast   metoprolol tartrate  100 mg Oral BID   multivitamin with minerals  1 tablet Oral Daily   mupirocin ointment  1 application Nasal BID   sodium chloride flush  3 mL Intravenous Q12H   sodium chloride flush  3 mL Intravenous Q12H   sodium chloride flush  3 mL Intravenous Q12H   Continuous Infusions:  sodium chloride     sodium chloride     sodium chloride     iron sucrose 500 mg (10/03/20 1225)   nitroGLYCERIN 5 mcg/min (10/04/20 0635)   PRN Meds: sodium chloride, sodium chloride, acetaminophen, bisacodyl, hydrALAZINE, labetalol, lidocaine, morphine injection, nitroGLYCERIN, ondansetron (ZOFRAN) IV, ondansetron **OR** [DISCONTINUED] ondansetron (ZOFRAN) IV, polyethylene glycol, sodium chloride flush, sodium chloride flush, traZODone   Vital Signs    Vitals:   10/04/20 0815 10/04/20 0820 10/04/20 0825 10/04/20 0841  BP: (!) 141/68 (!) 153/70  (!) 148/56  Pulse: 86 88 (!) 0 82  Resp: 18 18 (!) 0 17  Temp:      TempSrc:      SpO2: 96% 97% (!) 0% 96%  Weight:      Height:        Intake/Output Summary (Last 24 hours) at  10/04/2020 0908 Last data filed at 10/04/2020 0635 Gross per 24 hour  Intake 1064.94 ml  Output 3310 ml  Net -2245.06 ml   Filed Weights   10/03/20 1320 10/03/20 1626 10/04/20 0400  Weight: 103.1 kg 100.1 kg 99.6 kg    Telemetry    NSR - Personally Reviewed  ECG    NA - Personally Reviewed  Physical Exam   GEN: No acute distress.   Neck: No  JVD Cardiac: RRR, no murmurs, rubs, or gallops.  Respiratory: Clear  to auscultation bilaterally. GI: Soft, nontender, non-distended  MS: No  edema; No deformity.  Right femoral access site without bleeding Neuro:  Nonfocal  Psych: Normal affect   Labs    Chemistry Recent Labs  Lab 09/30/20 0654 10/01/20 0417 10/02/20 1331 10/03/20 0425 10/04/20 0615  NA 142   < > 136 140 138  K 4.4   < > 3.8 4.0 3.9  CL 113*   < > 104 105 99  CO2 18*   < > 22 25 27   GLUCOSE 141*   < > 177* 87 88  BUN 66*   < > 47* 39* 31*  CREATININE 5.56*   < > 4.57* 4.35* 4.48*  CALCIUM 8.2*   < >  7.3* 7.3* 7.6*  PROT 7.7  --   --   --   --   ALBUMIN 3.4*   < > 2.1* 2.1* 2.2*  AST 15  --   --   --   --   ALT 16  --   --   --   --   ALKPHOS 58  --   --   --   --   BILITOT 0.5  --   --   --   --   GFRNONAA 10*   < > 12* 13* 13*  ANIONGAP 11   < > 10 10 12    < > = values in this interval not displayed.     Hematology Recent Labs  Lab 10/02/20 1331 10/03/20 0425 10/04/20 0615  WBC 5.0 5.1 5.7  RBC 2.41* 2.45* 2.55*  HGB 7.0* 7.0* 7.4*  HCT 21.5* 22.5* 23.4*  MCV 89.2 91.8 91.8  MCH 29.0 28.6 29.0  MCHC 32.6 31.1 31.6  RDW 14.1 13.9 13.8  PLT 131* 127* 136*    Cardiac EnzymesNo results for input(s): TROPONINI in the last 168 hours. No results for input(s): TROPIPOC in the last 168 hours.   BNP Recent Labs  Lab 09/30/20 0654  BNP 1,022.0*     DDimer No results for input(s): DDIMER in the last 168 hours.   Radiology    CARDIAC CATHETERIZATION  Result Date: 10/04/2020  Ost RCA to Prox RCA lesion is 100% stenosed.  Mid LAD  lesion is 50% stenosed.  Timtohy Broski is a 79 y.o. male  [id number] LOCATION:  FACILITY: [facility] PHYSICIAN: Quay Burow, M.D. Mar 14, 1941 DATE OF PROCEDURE:  10/04/2020 DATE OF DISCHARGE: CARDIAC CATHETERIZATION History obtained from chart review.79 y.o. male with known CAD admitted with NSTEMI. EF remains normal on TTE this admission.  He does have moderate AI.  He has a temporary dialysis catheter in place and will ultimately need an AV fistula placed for long-term hemodialysis.  He was referred for diagnostic coronary angiography because of chest pain shortness of breath and positive enzymes.   Mr. Nyquist  has an occluded RCA (CTO) with grade 3 left-to-right collaterals.  He has a 50% mid LAD lesion.  His LVEDP was only mildly elevated at 19.  He was admitted with chest pain and shortness of breath.  His BNP was elevated and his enzymes peaked at about 3000.  I believe this was demand ischemia from volume overload and collateral insufficiency.  Medical therapy will be recommended.  He will need an AV fistula placed once he is hemodynamically stable.  A right common femoral angiogram was performed and a Mynx closure device was successfully deployed achieving hemostasis.  The patient left lab in stable condition. Quay Burow. MD, [facility] 10/04/2020 8:38 AM    Cardiac Studies   Echo  1. Left ventricular ejection fraction, by estimation, is 55 to 60%. The left ventricle has normal function. The left ventricle has no regional wall motion abnormalities. There is severe left ventricular hypertrophy. Left ventricular diastolic parameters are indeterminate. Elevated left atrial pressure. 2. Right ventricular systolic function is normal. The right ventricular size is normal. 3. A small pericardial effusion is present. The pericardial effusion is circumferential. There is no evidence of cardiac tamponade. Large pleural effusion in the left lateral region. 4. The mitral valve is normal in structure. Trivial  mitral valve regurgitation. No evidence of mitral stenosis. 5. The aortic valve was not well visualized. There is mild calcification of the aortic valve. There is  mild thickening of the aortic valve. Aortic valve regurgitation is moderate. Moderate aortic valve stenosis. 6. The inferior vena cava is normal in size with greater than 50% respiratory variability, suggesting right atrial pressure of 3 mmHg.   Diagnostic cath Dominance: Right    Patient Profile     79 y.o. male with CADadmitted with NSTEMI.  EF NL.  ESRD.    Assessment & Plan    NSTEMI.  Cath results as above.  On IV heparin and IV NTG.   Discontinue both.  Medical management of chronic RCA occlusion.   ACUTE ON CHRONIC DIASTOLIC HF:  Volume per dialysis.  HTN:   BPs are well controlled on current meds.   Will titrate now that NTG is off.   For questions or updates, please contact [place] Please consult [email or web address] for contact info under Cardiology/STEMI.   Signed, Minus Breeding, MD  10/04/2020, 9:08 AM

## 2020-10-04 NOTE — Progress Notes (Signed)
PROGRESS NOTE    Michael Trevino  UXL:[id number] DOB: [birth year] DOA: 09/30/2020  PCP: Dettinger, Fransisca Kaufmann, MD    Brief Narrative:   Michael Trevino  is a 79 y.o. male with past medical history relevant for HTN, DM2, CKD 5 with nephrotic syndrome,, hypothyroidism, CAD with prior MI, and HLD and obesity who initially presented on 09/30/2020 to the surgical unit for creation of a left arm AV fistula by vascular surgeon Dr. Curt Jews--- however patient complained of some chest discomfort and shortness of breath, so his procedure was canceled, and patient was sent to the ED for work-up for his shortness of breath -Patient was very short of breath and hypoxic required continuous BiPAP initially, also requiring IV nitro drip for elevated BP and CHF flareup -Patient received IV Lasix 80 mg x 2 with very limited output --Chest x-ray in the ED showed bilateral pleural effusion left more than right --Patient underwent successful ultrasound-guided thoracentesis on 09/30/2020 with removal of 1 L from the left pleural space--- studies pending -After thoracentesis patient was eventually able to come off BiPAP, and transitioned to nasal cannula --- Echo from 53/66/4403 with diastolic dysfunction No fever  Or chills  -No productive cough No nausea, vomiting, diarrhea , denies pleuritic symptoms does have leg swelling In ED hemoglobin is 9.6 which is not far from his recent baseline, WBC 7.7 and platelets 194 --Creatinine is up to 5.5, with a bicarb of 18, potassium is 4.4 and BUN is 66 -LFTs are not elevated -BNP is elevated at 1022 and chest x-ray consistent with CHF with effusions -Troponin is 194 in the setting of renal failure   Assessment & Plan:  HFpEF--patient with acute on chronic diastolic dysfunction CHF exacerbation -echo from 09/30/2020 with EF of 55 to  60%, with severe LVH, small pericardial effusion without significant valvular abnormalities and no significant wall motion  abnormalities ----Chest x-ray on admission with CHF and bilateral pleural effusion left more than right --Patient underwent successful ultrasound-guided thoracentesis on 09/30/2020 with removal of 1 L from the left pleural space--- studies pending -After thoracentesis patient was eventually able to come off BiPAP, and transitioned to nasal cannula --BNP is elevated at 1022 ,-Troponin is 194 in the setting of renal failure -Continue to dose high-dose IV Lasix per nephrology service -Patient will need to initiate hemodialysis to address his volume status. -Pt is on 4 L [place] and notes that his legs are less swollen .  Intake/Output Summary (Last 24 hours) at 10/04/2020 0910 Last data filed at 10/04/2020 4742 Gross per 24 hour  Intake 1064.94 ml  Output 3310 ml  Net -2245.06 ml   --pt seen today he is alert awake and oriented and cardiology dr.lambert at bedside with plan for LHC in am and we will make pt npo after midnight.   --pt s/p cardiac cath and found obstructive cad with plan for conservative medical management, pt continued on  Metoprolol, hydralazine,lipitro, and amlodipine.   Chronic anemia of CKD-- In ED hemoglobin is 9.6 which is not far from his recent baseline--no evidence of ongoing bleeding, -EPO/ESA agent per nephrology service. -Hb is 8.3 we will follow.  --h/h is 7.4 and has been stable at 7. Will transfuse one unit during dialysis, will d/w Nephro MD.   CKD IV/ESRD / HD-- --Creatinine is up to 5.5, with a bicarb of 18, potassium is 4.4 and BUN is 66 -Patient is clearly volume overloaded clinically and radiologically -Abdomen seizures above #1 --Timing of initiation of hemodialysis per nephrology  service. -greatly appreciate consult . Lab Results  Component Value Date        CREATININE 4.57 (H) 10/02/2020   CREATININE 5.96 (H) 10/01/2020   --pt seen today and creatinine is stable. He is tolerating dialysis and further rec per nephrology.  -- Lab Results   Component Value Date   CREATININE 4.48 (H) 10/04/2020   CREATININE 4.35 (H) 10/03/2020   CREATININE 4.57 (H) 10/02/2020     HTN--much improved with IV nitro, okay to transition off IV nitro to p.o. amlodipine 10 mg daily and hydralazine 50 mg 3 times daily -Use IV labetalol as needed persistent elevated BP once transitioned off IV nitro Blood pressure (!) 148/56, pulse 82, temperature 98.6 F (37 C), temperature source Oral, resp. rate 17, height 5\' 9"  (1.753 m), weight 99.6 kg, SpO2 96 %. prn hydralazine.  Blood pressure (!) 151/59, pulse 80, temperature 98 F (36.7 C), temperature source Oral, resp. rate 18, height 5\' 9"  (1.753 m), weight 100.1 kg, SpO2 98 %. PRN hydralazine.   Blood pressure (!) 138/53, pulse 79, temperature 98.9 F (37.2 C), temperature source Oral, resp. rate 20, height 5\' 9"  (1.753 m), weight 99.6 kg, SpO2 95 %. No change in regimen cont BB/AMlodipine and hydralazine.   Acute hypoxic respiratory failure--- in the setting of volume overload in a patient with worsening CKD 5, as well as acute on chronic diastolic CHF exacerbation with bilateral left more than right pleural effusions--oxygenation improved after left-sided thoracentesis with removal of 1 L -Continue supplemental oxygen, patient has been able to come off BiPAP -Anticipate improvement in oxygenation with improvement in CHF and after initiation of hemodialysis to address volume status. -SpO2: 96 % O2 Flow Rate (L/min): 6 L/min -VBG pending. -Echo results as follow: IMPRESSION: 1. Left ventricular ejection fraction, by estimation, is 55 to 60%. The  left ventricle has normal function. The left ventricle has no regional  wall motion abnormalities. There is severe left ventricular hypertrophy.  Left ventricular diastolic parameters  are indeterminate. Elevated left atrial pressure.  2. Right ventricular systolic function is normal. The right ventricular  size is normal.  3. A small  pericardial effusion is present. The pericardial effusion is  circumferential. There is no evidence of cardiac tamponade. Large pleural  effusion in the left lateral region.  4. The mitral valve is normal in structure. Trivial mitral valve  regurgitation. No evidence of mitral stenosis.  5. The aortic valve was not well visualized. There is mild calcification  of the aortic valve. There is mild thickening of the aortic valve. Aortic  valve regurgitation is moderate. Moderate aortic valve stenosis.  6. The inferior vena cava is normal in size with greater than 50%  respiratory variability, suggesting right atrial pressure of 3 mmHg.  VBg shows hypoxia. Will get bipap at bedside.  -- SpO2: 96 % O2 Flow Rate (L/min): 6 L/min No change in oxygen requirement we will continue to follow pt still reports sob and I expect will improve once he is euvolemic.    --SpO2: 95 % O2 Flow Rate (L/min): 6 L/min. Pt is alert and on [place] today he denies any sob or chest pressure or pain.    CAD/HLD--continue Lipitor, aspirin, and metoprolol -troponin as above #1 -Currently chest pain-free. 10/02/20 Continue Lipitor, aspirin,metoprolol, troponin. Results for Michael Trevino, Michael Trevino (MRN [id number]) as of 10/02/2020 08:48  Ref. Range 10/02/2020 03:40  Total CHOL/HDL Ratio Latest Units: RATIO 3.8  Cholesterol Latest Ref Range: 0 - 200 mg/dL 147  HDL Cholesterol  Latest Ref Range: >40 mg/dL 39 (L)  LDL (calc) Latest Ref Range: 0 - 99 mg/dL 93  Triglycerides Latest Ref Range: <150 mg/dL 74  VLDL Latest Ref Range: 0 - 40 mg/dL 15  LDL is not at goal and neither is hdl. Pt will need to change to Crestor on discharge and we will increase Lipitor for now.   --Continue amlodipine.  DM2-A1c 6.7 reflecting excellent diabetic control PTA -Use Novolog/Humalog Sliding scale insulin with Accu-Cheks/Fingersticks as ordered. --continue control with glycemic protocol.   Hypothyroidism--continue levothyroxine 175 mcg  daily. 10/02/20 Continue levothyroxine at 175 mcg daily. Patient to be taken on an empty stomach in the morning. Thyroid function test pending. No change to levothyroxine.    DVT prophylaxis:  heparin  Disposition Plan: TBD  Consultants:  Cardiology Nephrology  Procedures:  none  Subjective/Overview: Pt admitted with chf exacerbation ,currently stable on 4L [place]. Pt admitted on the 18th, seen by cardiology and started on heparin drip. Plan per cardiology is for left heart cath once dialysis access is established. Pt seen by nephrology Dr.webb and Pt had HD access placed on 10/01/20 by IR. Pt had HD with 1L removed yesterday.pt is alert,awake and oriented.  It seen today he is post cath found to have obstructive CAD and Medical management with optimization once pt stabilizes.  Objective: Vitals:   10/04/20 0815 10/04/20 0820 10/04/20 0825 10/04/20 0841  BP: (!) 141/68 (!) 153/70  (!) 148/56  Pulse: 86 88 (!) 0 82  Resp: 18 18 (!) 0 17  Temp:      TempSrc:      SpO2: 96% 97% (!) 0% 96%  Weight:      Height:       SpO2: 96 % O2 Flow Rate (L/min): 6 L/min  Intake/Output Summary (Last 24 hours) at 10/04/2020 0910 Last data filed at 10/04/2020 4010 Gross per 24 hour  Intake 1064.94 ml  Output 3310 ml  Net -2245.06 ml   Filed Weights   10/03/20 1320 10/03/20 1626 10/04/20 0400  Weight: 103.1 kg 100.1 kg 99.6 kg    Examination: Blood pressure (!) 148/56, pulse 82, temperature 98.6 F (37 C), temperature source Oral, resp. rate 17, height 5\' 9"  (1.753 m), weight 99.6 kg, SpO2 96 %. General exam: Appears calm and comfortable  HEENT:EOMI, perrl  Respiratory system: Clear to auscultation. Respiratory effort normal. Cardiovascular system: S1 & S2 heard, RRR. No JVD, murmurs, rubs, gallops or clicks. No pedal edema. Gastrointestinal system: Abdomen is nondistended, soft and nontender. No organomegaly or masses felt. Normal bowel sounds heard. Central nervous system:  Alert and oriented.CN grossly intact No focal neurological deficits. Extremities: pt moving all 4 ext and ambulating. Skin: No rashes, lesions or ulcers Psychiatry: Judgement and insight appear normal. Mood & affect appropriate.     Data Reviewed: I have personally reviewed following labs and imaging studies  Labs  No results for input(s): CKTOTAL, CKMB, TROPONINI in the last 72 hours. Lab Results  Component Value Date   WBC 5.7 10/04/2020   HGB 7.4 (L) 10/04/2020   HCT 23.4 (L) 10/04/2020   MCV 91.8 10/04/2020   PLT 136 (L) 10/04/2020    Recent Labs  Lab 09/30/20 0654 10/01/20 0417 10/04/20 0615  NA 142   < > 138  K 4.4   < > 3.9  CL 113*   < > 99  CO2 18*   < > 27  BUN 66*   < > 31*  CREATININE 5.56*   < >  4.48*  CALCIUM 8.2*   < > 7.6*  PROT 7.7  --   --   BILITOT 0.5  --   --   ALKPHOS 58  --   --   ALT 16  --   --   AST 15  --   --   GLUCOSE 141*   < > 88   < > = values in this interval not displayed.   Lab Results  Component Value Date   CHOL 147 10/02/2020   HDL 39 (L) 10/02/2020   LDLCALC 93 10/02/2020   TRIG 74 10/02/2020   Lab Results  Component Value Date   DDIMER 2.70 (H) 09/23/2019   Invalid input(s): POCBNP  Radiology Studies: CARDIAC CATHETERIZATION  Result Date: 10/04/2020  Ost RCA to Prox RCA lesion is 100% stenosed.  Mid LAD lesion is 50% stenosed.  Michael Trevino is a 79 y.o. male  [id number] LOCATION:  FACILITY: [facility] PHYSICIAN: Quay Burow, M.D. 26-Sep-1941 DATE OF PROCEDURE:  10/04/2020 DATE OF DISCHARGE: CARDIAC CATHETERIZATION History obtained from chart review.79 y.o. male with known CAD admitted with NSTEMI. EF remains normal on TTE this admission.  He does have moderate AI.  He has a temporary dialysis catheter in place and will ultimately need an AV fistula placed for long-term hemodialysis.  He was referred for diagnostic coronary angiography because of chest pain shortness of breath and positive enzymes.   Mr. Mcgaha  has an  occluded RCA (CTO) with grade 3 left-to-right collaterals.  He has a 50% mid LAD lesion.  His LVEDP was only mildly elevated at 19.  He was admitted with chest pain and shortness of breath.  His BNP was elevated and his enzymes peaked at about 3000.  I believe this was demand ischemia from volume overload and collateral insufficiency.  Medical therapy will be recommended.  He will need an AV fistula placed once he is hemodynamically stable.  A right common femoral angiogram was performed and a Mynx closure device was successfully deployed achieving hemostasis.  The patient left lab in stable condition. Quay Burow. MD, [facility] 10/04/2020 8:38 AM       Anti-infectives (From admission, onward)   None       Continuous Infusions: . sodium chloride    . sodium chloride    . sodium chloride    . iron sucrose 500 mg (10/03/20 1225)  . nitroGLYCERIN 5 mcg/min (10/04/20 1740)     LOS: 4 days    Para Skeans, MD [facility] Pager [phone] How to contact the [facility] Attending or Consulting provider [place] or covering provider during after hours [place], for this patient?    1. Check the care team in [facility] and look for a) attending/consulting [facility] provider listed and b) the [facility] team listed 2. Log into [email or web address] and use [place]'s universal password to access. If you do not have the password, please contact the hospital operator. 3. Locate the [facility] provider you are looking for under [facility] and page to a number that you can be directly reached. 4. If you still have difficulty reaching the provider, please page the [facility] (Director on Call) for the Hospitalists listed on amion for assistance. [email or web address] Password [email or web address] 10/04/2020, 9:10 AM

## 2020-10-04 NOTE — Interval H&P Note (Signed)
Cath Lab Visit (complete for each Cath Lab visit)  Clinical Evaluation Leading to the Procedure:   ACS: Yes.    Non-ACS:    Anginal Classification: CCS II  Anti-ischemic medical therapy: Minimal Therapy (1 class of medications)  Non-Invasive Test Results: No non-invasive testing performed  Prior CABG: No previous CABG      History and Physical Interval Note:  10/04/2020 7:46 AM  Michael Trevino  has presented today for surgery, with the diagnosis of NSTEMI.  The various methods of treatment have been discussed with the patient and family. After consideration of risks, benefits and other options for treatment, the patient has consented to  Procedure(s): LEFT HEART CATH AND CORONARY ANGIOGRAPHY (N/A) as a surgical intervention.  The patient's history has been reviewed, patient examined, no change in status, stable for surgery.  I have reviewed the patient's chart and labs.  Questions were answered to the patient's satisfaction.     Quay Burow

## 2020-10-04 NOTE — Progress Notes (Signed)
Round on patient today secondary to new start HD. Patient is in bed and sleeping post procedure. Per patient's previous nurse, the patient has memory impairment and the information about HD will be best given to his son Jorell Agne at [phone]. Left patient booklet at the bedside. Call to patient's son Fritz Pickerel who requested information on how and where his father would be going to treatment outpatient. Informed patients son that our Renal Navigator handles the patient placement in the outpatient setting and that I would defer that information to her. I also inquired who would be the best person for me to provide HD education. Patient's son reports that his mother, [name], would be the best person to educate and states he thinks she will be at the hospital tomorrow. Appreciative of son's input. Call to patieent's wife to schedule an approximate time she may be at the hospital with no answer. Will call to patient's nurse to call me if patient's wife arrives. Will follow as appropriate.  Mady Gemma Dialysis Nurse Coordinator [phone]

## 2020-10-04 NOTE — Progress Notes (Addendum)
Renal Navigator attempted to meet with patient at bedside, but he was too sleepy. He gave permission to call family listed in chart to discuss outpatient HD referral. Per Dialysis Nurse Coordinator, patient's son Michael Trevino [phone] is the best person to contact about this.  Navigator spoke with son Michael Trevino, who states the patient and family requests HD clinic in [place] for treatment out of convenience to the home. He states his sister/Michael Trevino travels to [place] frequently and she will be the main one taking patient to HD. Navigator explained that HD in [place] means a change in MD group from [place] to [place]. He states understanding and is agreeable since [place] will be so much easier for patient and family to get to than [place].  Navigator contacted patient's daughter/Michael Trevino at [phone] to discuss the same with her as well as potential shift times and preferences, adding that she can ask to be put on a waiting list if she does not get the shift and time that she prefers. She requests a TTS second shift if possible. She states she is retired, but transports her daughter to school in the mornings. Navigator updated Nephrologist and requests that HepB surface antibody and HepB total core antibody be ordered, as these labs are required for [place] referral in addition to HepB surface antigen (which has already resulted).  Referral submitted to [facility] to request treatment for ESRD at the [facility] in [place]. Referral pending HepB labs as noted above.  Navigator will follow closely.  Alphonzo Cruise, [place] Renal Navigator [phone]

## 2020-10-04 NOTE — Consult Note (Addendum)
Hospital Consult    Reason for Consult:  Permanent dialysis access Requesting Physician:  Dr. Cecille Rubin foster MRN #:  [id number]  History of Present Illness: This is a 79 y.o. male admitted 09/30/2020 with chest pain and underwent cardiac evaluation with heart cath. Diagnosed with non-elevated STEMI. He was placed on heparin and NTG infusions and these were discontinued this morning.  He is seen and evaluated on 2C where he is somnolent, awakens easily but quickly drifts off to sleep.  He is oriented to name, place, situation but cannot recall the year. He follows commands and denies current chest pain or SOB.  He has a right IJ temporary dialysis catheter. No history of pacemaker implantation.  He was evaluated in our office on September 06, 2020 by Dr. Donnetta Hutching for HD access.  This was scheduled for 09/30/2020, the day the patient was admitted, but he does not recall this.  The pt is on a statin for cholesterol management.  The pt is on a daily aspirin.   Other AC:  Heparin Kiron for DVT prophy The pt is on ARB, BB, Lasix for hypertension.   The pt is diabetic.  insulin Tobacco hx:  Former smoker; quit 1985  Past Medical History:  Diagnosis Date  . Arthritis   . Diabetes mellitus without complication ([place])   . Heart attack ([place])   . Hyperlipidemia   . Hypertension   . MI (myocardial infarction) ([place]) 2000  . Renal disorder   . Sepsis ([place])   . Sepsis due to Streptococcus, group B ([place]) 02/24/2013  . Stroke ([place])   . Thyroid disease    hypothyroid    Past Surgical History:  Procedure Laterality Date  . CARDIAC CATHETERIZATION  2000  . IR FLUORO GUIDE CV LINE RIGHT  10/01/2020  . IR US GUIDE VASC ACCESS RIGHT  10/01/2020  . LEFT HEART CATH AND CORONARY ANGIOGRAPHY N/A 10/04/2020   Procedure: LEFT HEART CATH AND CORONARY ANGIOGRAPHY;  Surgeon: Lorretta Harp, MD;  Location: [facility];  Service: Cardiovascular;  Laterality: N/A;  . TEE WITHOUT CARDIOVERSION N/A 02/26/2013    Procedure: TRANSESOPHAGEAL ECHOCARDIOGRAM (TEE);  Surgeon: Thayer Headings, MD;  Location: [facility];  Service: Cardiovascular;  Laterality: N/A;    Allergies  Allergen Reactions  . Zocor [Simvastatin - High Dose] Nausea Only    Unknown    Prior to Admission medications   Medication Sig Start Date End Date Taking? Authorizing Provider  amLODipine (NORVASC) 10 MG tablet TAKE 1 TABLET BY MOUTH EVERY DAY Patient taking differently: Take 10 mg by mouth daily.  06/14/20  Yes Dettinger, Fransisca Kaufmann, MD  ascorbic acid (VITAMIN C) 500 MG tablet Take 500 mg by mouth daily.   Yes [provider]  aspirin EC 81 MG tablet Take 81 mg by mouth daily.   Yes [provider]  atorvastatin (LIPITOR) 40 MG tablet TAKE 1 TABLET BY MOUTH EVERY DAY Patient taking differently: Take 40 mg by mouth daily.  04/22/20  Yes Dettinger, Fransisca Kaufmann, MD  calcitRIOL (ROCALTROL) 0.5 MCG capsule Take 0.5 mcg by mouth daily. 04/09/20  Yes [provider]  Calcium Carbonate 500 MG CHEW Chew 1 tablet (500 mg total) by mouth 3 (three) times daily as needed. Patient taking differently: Chew 500 mg by mouth daily.  05/01/16  Yes Cherre Robins, PharmD  cholecalciferol (VITAMIN D3) 25 MCG (1000 UNIT) tablet Take 1,000 Units by mouth daily.   Yes [provider]  furosemide (LASIX) 80 MG tablet  Take 1 tablet (80 mg total) by mouth 2 (two) times daily. 05/20/20  Yes Dettinger, Fransisca Kaufmann, MD  glucose blood test strip Use to check blood glucose once daily.  Dx:  E11.9 07/14/15  Yes Wardell Honour, MD  hydrALAZINE (APRESOLINE) 10 MG tablet Take 1 tablet (10 mg total) by mouth 3 (three) times daily. 07/12/20  Yes Dettinger, Fransisca Kaufmann, MD  insulin detemir (LEVEMIR FLEXPEN) 100 UNIT/ML FlexPen Inject 25 Units into the skin at bedtime. 06/23/20  Yes Brita Romp, NP  levothyroxine (SYNTHROID) 175 MCG tablet TAKE 1 TABLET (175 MCG TOTAL) BY MOUTH DAILY BEFORE BREAKFAST. 04/19/20  Yes Nida, Marella Chimes, MD   metoprolol tartrate (LOPRESSOR) 100 MG tablet TAKE 1 TABLET BY MOUTH TWICE A DAY Patient taking differently: Take 100 mg by mouth 2 (two) times daily.  09/03/20  Yes Dettinger, Fransisca Kaufmann, MD  nitroGLYCERIN (NITROSTAT) 0.4 MG SL tablet Place 1 tablet (0.4 mg total) under the tongue every 5 (five) minutes as needed for chest pain. 07/12/20  Yes Dettinger, Fransisca Kaufmann, MD    Social History   Socioeconomic History  . Marital status: Married    Spouse name: Not on file  . Number of children: 6  . Years of education: 31  . Highest education level: 9th grade  Occupational History  . 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 Smoking status: Former Smoker    Packs/day: 0.50    Years: 30.00    Pack years: 15.00    Types: Cigarettes    Quit date: 11/05/1984    Years since quitting: 35.9  . Smokeless tobacco: Never Used  Vaping Use  . Vaping Use: Never used  Substance and Sexual Activity  . Alcohol use: No  . Drug use: No  . Sexual activity: Yes  Other Topics Concern  . Not on file  Social History Narrative  . Not on file   Social Determinants of Health   Financial Resource Strain: Low Risk   . Difficulty of Paying Living Expenses: Not hard at all  Food Insecurity: No Food Insecurity  . Worried About Charity fundraiser in the Last Year: Never true  . Ran Out of Food in the Last Year: Never true  Transportation Needs: No Transportation Needs  . Lack of Transportation (Medical): No  . Lack of Transportation (Non-Medical): No  Physical Activity: Inactive  . Days of Exercise per Week: 0 days  . Minutes of Exercise per Session: 0 min  Stress: No Stress Concern Present  . Feeling of Stress : Not at all  Social Connections: Moderately Integrated  . Frequency of Communication with Friends and Family: Once a week  . Frequency of Social Gatherings with Friends and Family: More than three times  a week  . Attends Religious Services: More than 4 times per year  . Active Member of Clubs or Organizations: No  . Attends Archivist Meetings: Never  . Marital Status: Married  Human resources officer Violence: Not At Risk  . Fear of Current or Ex-Partner: No  . Emotionally Abused: No  . Physically Abused: No  . Sexually Abused: No     Family History  Problem Relation Age of Onset  . Diabetes Mother   . Hypertension Mother   . Cancer Father   . Stroke Brother   . Alcohol abuse Brother   . Diabetes Brother   .  Diabetes Sister   . Diabetes Brother 13       TYPE 1  . Kidney disease Brother 52       DIALYSIS  . Heart attack Brother   . Healthy Daughter   . Healthy Son   . Healthy Daughter   . Healthy Son   . Healthy Son   . Healthy Son     ROS: [x]  Positive   [ ]  Negative   [ ]  All sytems reviewed and are negative  Cardiac: []  chest pain/pressure []  palpitations []  SOB lying flat []  DOE  Vascular: []  pain in legs while walking []  pain in legs at rest []  pain in legs at night []  non-healing ulcers []  hx of DVT []  swelling in legs  Pulmonary: []  productive cough []  asthma/wheezing []  home O2  Neurologic: []  weakness in []  arms []  legs []  numbness in []  arms []  legs []  hx of CVA []  mini stroke [] difficulty speaking or slurred speech []  temporary loss of vision in one eye []  dizziness  Hematologic: []  hx of cancer []  bleeding problems []  problems with blood clotting easily  Endocrine:   [x]  diabetes [x]  thyroid disease  GI []  vomiting blood []  blood in stool  GU: [x]  CKD/renal failure [x]  HD--[]  M/W/F or []  T/T/S []  burning with urination []  blood in urine  Psychiatric: []  anxiety []  depression  Musculoskeletal: []  arthritis []  joint pain  Integumentary: []  rashes []  ulcers  Constitutional: []  fever []  chills   Physical Examination  Vitals:   10/04/20 0841 10/04/20 0932  BP: (!) 148/56 (!) 156/61  Pulse: 82   Resp: 17    Temp:    SpO2: 96%    Body mass index is 32.43 kg/m.  General:  WDWN in NAD; drowsy Gait: Not observed HENT: WNL, normocephalic Pulmonary: normal non-labored breathing, without Rales, rhonchi,  wheezing Cardiac: regular, with Murmur; with left carotid bruit versus radiation from sys heart murmur Abdomen: obese, soft, NT/ND, no masses Skin: without rashes Vascular Exam/Pulses:  Right Left  Radial 2+ (normal) 2+ (normal)  Ulnar Not eval 2+  Brachial Not eval 2+  Femoral 2+ (normal) 2+ (normal)  Popliteal Not eval Not eval               Extremities:  TED hose in place bilaterally; no significant edema; AROM. Right groin puncture site is soft without hematoma Musculoskeletal: no muscle wasting or atrophy  Neurologic: A&O X 3;  No focal weakness or paresthesias are detected; speech is fluent/normal Psychiatric:  The pt has flat affect. A and O times 3   CBC    Component Value Date/Time   WBC 5.7 10/04/2020 0615   RBC 2.55 (L) 10/04/2020 0615   HGB 7.4 (L) 10/04/2020 0615   HGB 10.1 (L) 07/12/2020 1209   HCT 23.4 (L) 10/04/2020 0615   HCT 30.2 (L) 07/12/2020 1209   PLT 136 (L) 10/04/2020 0615   PLT 198 07/12/2020 1209   MCV 91.8 10/04/2020 0615   MCV 87 07/12/2020 1209   MCH 29.0 10/04/2020 0615   MCHC 31.6 10/04/2020 0615   RDW 13.8 10/04/2020 0615   RDW 15.1 07/12/2020 1209   LYMPHSABS 3.1 09/30/2020 0654   LYMPHSABS 2.7 07/12/2020 1209   MONOABS 0.4 09/30/2020 0654   EOSABS 0.1 09/30/2020 0654   EOSABS 0.1 07/12/2020 1209   BASOSABS 0.0 09/30/2020 0654   BASOSABS 0.0 07/12/2020 1209    BMET    Component Value Date/Time   NA 138 10/04/2020 0615  NA 142 07/12/2020 1209   K 3.9 10/04/2020 0615   CL 99 10/04/2020 0615   CO2 27 10/04/2020 0615   GLUCOSE 88 10/04/2020 0615   BUN 31 (H) 10/04/2020 0615   BUN 54 (H) 07/12/2020 1209   CREATININE 4.48 (H) 10/04/2020 0615   CALCIUM 7.6 (L) 10/04/2020 0615   GFRNONAA 13 (L) 10/04/2020 0615   GFRAA 13 (L)  07/12/2020 1209    COAGS: Lab Results  Component Value Date   INR 1.0 09/30/2020     Non-Invasive Vascular Imaging:   09/06/2020  EXAM: [place] EXTREM UP VEIN MAPPING  COMPARISON:  None.  FINDINGS: RIGHT ARTERIES  Wrist Radial Artery:  Size 2.62mm Waveform biphasic  Wrist Ulnar Artery:  Size less than 2 mmmm Waveform biphasic  Prox. Forearm Radial Artery:  Size 3.26mm Waveform biphasic  Upper Arm Brachial Artery:  Size 5.20mm Waveform biphasic  RIGHT VEINS  Forearm Cephalic Vein:  Prox 3.8VF Distal less than 44mm Depth 3.31mm  Upper Arm Cephalic Vein:  Prox 6.4PP Distal 3.63mm Depth 2.9JJ  Upper Arm Basilic Vein:  Prox 8.8CZ Distal 3.50mm Depth 7.78mm  Upper Arm Brachial Vein:  Prox 7.83mm Distal 4.67mm Depth 17.7mm  ADDITIONAL RIGHT VEINS  Axillary Vein:  6.43mm  Subclavian Vein:  Patient: Yes Respiratory Phasicity: Present  Internal Jugular Vein:  Patent: Yes    Respiratory Phasicity: Present  Branches > 2 mm:  Two branches arise from the basilic vein in the distal upper arm measuring 2.4 and 1.8 mm in diameter. Multiple branches arise from the brachial vein in the upper arm.  LEFT ARTERIES  Wrist Radial Artery:  Size 2.47mm Waveform biphasic  Wrist Ulnar Artery:  Size less than 38mm Waveform biphasic  Prox. Forearm Radial Artery:  Size 3.54mm Waveform biphasic  Upper Arm Brachial Artery:  Size 5.54mm Waveform biphasic  LEFT VEINS  Forearm Cephalic Vein:  Prox 6.6AY Distal less than 67mm Depth 4.32mm  Upper Arm Cephalic Vein:  Variant anatomy.  No upper arm cephalic vein.  Upper Arm Basilic Vein:  Prox 3.0ZS Distal 4.7mm Depth 6.22mm  Upper Arm Brachial Vein:  Prox 7.29mm Distal 4.32mm Depth 9.70mm  ADDITIONAL LEFT VEINS  Axillary Vein:  7.58mm  Subclavian Vein:  Patient: Yes Respiratory Phasicity: Present  Internal Jugular Vein:  Patent: Yes    Respiratory Phasicity:  Present  Branches > 2 mm:  Single branch arising from the basilic vein in the distal upper arm measuring 3.3 mm.  IMPRESSION: 1. Small radial arteries at the wrists bilaterally. 2. No evidence of deep or superficial venous thrombosis. 3. Small right cephalic vein an absent left upper arm cephalic vein. 4. Robust bilateral upper arm basilic and brachial veins   -Per dr. Luther Parody note dated 09/06/2020: DATA:  "He underwent noninvasive upper extremity arterial and venous studies today at [facility].  I reviewed the studies and discussed them with the patient and his wife.  This shows very small cephalic veins bilaterally.  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  This does confirm a large left side basilic vein."  [0/11/930: bilateral carotid artery duplex>>1-39% bilateral ICA stenosis. Bil vertebrals antegrade, normal flow bilateral subclavians]  ASSESSMENT/PLAN: This is a 79 y.o. male admitted on 09/30/2020 with non-elevated STEMI and underwent cardiac catheterization. Cardiology plan today is to discontinue heparin and nitroglycerin infusions and medically manage chronic RCA occlusion.  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 He had third inpatient HD treatment  on 11/21 via right IJ temp HD catheter. He was evaluated in October by Dr. Donnetta Hutching in the office and plans were in place for LUE AVF, possible single stage left basilic vein transposition.  He is drowsy this am. No family at bedside, but wife listed in contacts. Dr Carlis Abbott, vascular surgeon on-call, will evaluate and provide further details regarding timing of AV fistula and tunneled HD catheter.  Will ensure extremity alert band is placed.  Risa Grill, PA-C [facility] [phone]   I have seen and evaluated the patient. I agree with the PA note as documented above.  79 year old male that was scheduled for left arm  basilic vein fistula last week with Dr. Donnetta Hutching and it was canceled due to CP and dyspnea.  Patient ultimately was found to have NSTEMI and underwent cath today.  We have been asked to exchange right IJ temporary catheter for tunneled dialysis catheter and also permanent AV fistula placement.  Patient is right handed.  Previous vein mapping showed usable basilic vein.  Discussed with patient plan for left arm AV fistula tomorrow as well as TDC with Dr. Scot Dock.  Dr. Donnetta Hutching previously discussed making this a one stage basilic vein but I would likely make this two stages given that has been on heparin and risk for postop hematomas in addition to minimizing his time in the OR in the setting of recent NSTEMI.  Please keep NPO after midnight.  Marty Heck, MD [facility] [place] Office: [phone]

## 2020-10-04 NOTE — Plan of Care (Signed)
  Problem: Skin Integrity: Goal: Risk for impaired skin integrity will decrease Outcome: Progressing   Problem: Safety: Goal: Ability to remain free from injury will improve Outcome: Progressing   Problem: Pain Managment: Goal: General experience of comfort will improve Outcome: Progressing   Problem: Elimination: Goal: Will not experience complications related to bowel motility Outcome: Progressing   Problem: Education: Goal: Knowledge of General Education information will improve Description: Including pain rating scale, medication(s)/side effects and non-pharmacologic comfort measures Outcome: Progressing

## 2020-10-05 ENCOUNTER — Inpatient Hospital Stay (HOSPITAL_COMMUNITY): Payer: Medicare Other

## 2020-10-05 ENCOUNTER — Inpatient Hospital Stay (HOSPITAL_COMMUNITY): Payer: Medicare Other | Admitting: Certified Registered Nurse Anesthetist

## 2020-10-05 ENCOUNTER — Encounter (HOSPITAL_COMMUNITY): Payer: Self-pay | Admitting: Family Medicine

## 2020-10-05 ENCOUNTER — Encounter (HOSPITAL_COMMUNITY)
Admission: EM | Disposition: A | Discharge status: Skilled Nursing Facility | Payer: Self-pay | Source: Home / Self Care | Attending: Internal Medicine

## 2020-10-05 DIAGNOSIS — I2510 Atherosclerotic heart disease of native coronary artery without angina pectoris: Secondary | ICD-10-CM | POA: Diagnosis not present

## 2020-10-05 DIAGNOSIS — E039 Hypothyroidism, unspecified: Secondary | ICD-10-CM | POA: Diagnosis not present

## 2020-10-05 DIAGNOSIS — N185 Chronic kidney disease, stage 5: Secondary | ICD-10-CM | POA: Diagnosis not present

## 2020-10-05 DIAGNOSIS — I10 Essential (primary) hypertension: Secondary | ICD-10-CM | POA: Diagnosis not present

## 2020-10-05 DIAGNOSIS — I5033 Acute on chronic diastolic (congestive) heart failure: Secondary | ICD-10-CM | POA: Diagnosis not present

## 2020-10-05 HISTORY — PX: INSERTION OF DIALYSIS CATHETER: SHX1324

## 2020-10-05 HISTORY — PX: AV FISTULA PLACEMENT: SHX1204

## 2020-10-05 LAB — POCT I-STAT, CHEM 8
BUN: 21 mg/dL (ref 8–23)
Calcium, Ion: 0.97 mmol/L — ABNORMAL LOW (ref 1.15–1.40)
Chloride: 99 mmol/L (ref 98–111)
Creatinine, Ser: 3.4 mg/dL — ABNORMAL HIGH (ref 0.61–1.24)
Glucose, Bld: 94 mg/dL (ref 70–99)
HCT: 31 % — ABNORMAL LOW (ref 39.0–52.0)
Hemoglobin: 10.5 g/dL — ABNORMAL LOW (ref 13.0–17.0)
Potassium: 3.3 mmol/L — ABNORMAL LOW (ref 3.5–5.1)
Sodium: 137 mmol/L (ref 135–145)
TCO2: 25 mmol/L (ref 22–32)

## 2020-10-05 LAB — CULTURE, BODY FLUID W GRAM STAIN -BOTTLE: Culture: NO GROWTH

## 2020-10-05 LAB — CBC
HCT: 21.7 % — ABNORMAL LOW (ref 39.0–52.0)
Hemoglobin: 6.9 g/dL — CL (ref 13.0–17.0)
MCH: 29.4 pg (ref 26.0–34.0)
MCHC: 31.8 g/dL (ref 30.0–36.0)
MCV: 92.3 fL (ref 80.0–100.0)
Platelets: 144 10*3/uL — ABNORMAL LOW (ref 150–400)
RBC: 2.35 MIL/uL — ABNORMAL LOW (ref 4.22–5.81)
RDW: 13.5 % (ref 11.5–15.5)
WBC: 6.1 10*3/uL (ref 4.0–10.5)
nRBC: 0.3 % — ABNORMAL HIGH (ref 0.0–0.2)

## 2020-10-05 LAB — RENAL FUNCTION PANEL
Albumin: 2.1 g/dL — ABNORMAL LOW (ref 3.5–5.0)
Anion gap: 14 (ref 5–15)
BUN: 46 mg/dL — ABNORMAL HIGH (ref 8–23)
CO2: 25 mmol/L (ref 22–32)
Calcium: 7.3 mg/dL — ABNORMAL LOW (ref 8.9–10.3)
Chloride: 96 mmol/L — ABNORMAL LOW (ref 98–111)
Creatinine, Ser: 6.1 mg/dL — ABNORMAL HIGH (ref 0.61–1.24)
GFR, Estimated: 9 mL/min — ABNORMAL LOW (ref >60)
Glucose, Bld: 85 mg/dL (ref 70–99)
Phosphorus: 4.9 mg/dL — ABNORMAL HIGH (ref 2.5–4.6)
Potassium: 3.9 mmol/L (ref 3.5–5.1)
Sodium: 135 mmol/L (ref 135–145)

## 2020-10-05 LAB — GLUCOSE, CAPILLARY
Glucose-Capillary: 103 mg/dL — ABNORMAL HIGH (ref 70–99)
Glucose-Capillary: 92 mg/dL (ref 70–99)
Glucose-Capillary: 100 mg/dL — ABNORMAL HIGH (ref 70–99)
Glucose-Capillary: 244 mg/dL — ABNORMAL HIGH (ref 70–99)

## 2020-10-05 LAB — HEMOGLOBIN AND HEMATOCRIT, BLOOD
HCT: 29.1 % — ABNORMAL LOW (ref 39.0–52.0)
Hemoglobin: 9.5 g/dL — ABNORMAL LOW (ref 13.0–17.0)

## 2020-10-05 LAB — PREPARE RBC (CROSSMATCH)

## 2020-10-05 LAB — HEPATITIS B SURFACE ANTIBODY, QUANTITATIVE
Hep B S AB Quant (Post): <3.1 m[IU]/mL — ABNORMAL LOW (ref >9.9)
Hep B S AB Quant (Post): <3.1 m[IU]/mL — ABNORMAL LOW (ref >9.9)

## 2020-10-05 LAB — ABO/RH: ABO/RH(D): AB POS

## 2020-10-05 SURGERY — ARTERIOVENOUS (AV) FISTULA CREATION
Anesthesia: Monitor Anesthesia Care | Laterality: Right

## 2020-10-05 MED ORDER — HEPARIN SODIUM (PORCINE) 1000 UNIT/ML IJ SOLN
INTRAMUSCULAR | Status: AC
  Filled 2020-10-05: qty 3

## 2020-10-05 MED ORDER — FENTANYL CITRATE (PF) 250 MCG/5ML IJ SOLN
INTRAMUSCULAR | Status: AC
  Filled 2020-10-05: qty 5

## 2020-10-05 MED ORDER — PROPOFOL 10 MG/ML IV BOLUS
INTRAVENOUS | Status: DC | PRN
  Administered 2020-10-05: 120 mg via INTRAVENOUS

## 2020-10-05 MED ORDER — PHENYLEPHRINE 40 MCG/ML (10ML) SYRINGE FOR IV PUSH (FOR BLOOD PRESSURE SUPPORT)
PREFILLED_SYRINGE | INTRAVENOUS | Status: AC
  Filled 2020-10-05: qty 10

## 2020-10-05 MED ORDER — LIDOCAINE-EPINEPHRINE (PF) 1 %-1:200000 IJ SOLN
INTRAMUSCULAR | Status: AC
  Filled 2020-10-05: qty 30

## 2020-10-05 MED ORDER — SODIUM CHLORIDE 0.9% IV SOLUTION: Freq: Once | INTRAVENOUS | Status: DC

## 2020-10-05 MED ORDER — LIDOCAINE-PRILOCAINE 2.5-2.5 % EX CREA: 1.0000 "application " | TOPICAL_CREAM | CUTANEOUS | Status: DC | PRN

## 2020-10-05 MED ORDER — CEFAZOLIN SODIUM-DEXTROSE 2-3 GM-%(50ML) IV SOLR
INTRAVENOUS | Status: DC | PRN
  Administered 2020-10-05: 2 g via INTRAVENOUS

## 2020-10-05 MED ORDER — DEXAMETHASONE SODIUM PHOSPHATE 10 MG/ML IJ SOLN
INTRAMUSCULAR | Status: AC
  Filled 2020-10-05: qty 2

## 2020-10-05 MED ORDER — DEXAMETHASONE SODIUM PHOSPHATE 10 MG/ML IJ SOLN
INTRAMUSCULAR | Status: DC | PRN
  Administered 2020-10-05: 5 mg via INTRAVENOUS

## 2020-10-05 MED ORDER — PROTAMINE SULFATE 10 MG/ML IV SOLN
INTRAVENOUS | Status: AC
  Filled 2020-10-05: qty 10

## 2020-10-05 MED ORDER — SODIUM CHLORIDE 0.9 % IV SOLN: 100.0000 mL | INTRAVENOUS | Status: DC | PRN

## 2020-10-05 MED ORDER — LACTATED RINGERS IV SOLN: INTRAVENOUS | Status: DC

## 2020-10-05 MED ORDER — FENTANYL CITRATE (PF) 250 MCG/5ML IJ SOLN
INTRAMUSCULAR | Status: DC | PRN
  Administered 2020-10-05 (×5): 25 ug via INTRAVENOUS
  Administered 2020-10-05: 50 ug via INTRAVENOUS
  Administered 2020-10-05: 25 ug via INTRAVENOUS

## 2020-10-05 MED ORDER — SODIUM CHLORIDE 0.9 % IV SOLN
INTRAVENOUS | Status: DC | PRN
  Administered 2020-10-05: 500 mL

## 2020-10-05 MED ORDER — DARBEPOETIN ALFA 40 MCG/0.4ML IJ SOSY
PREFILLED_SYRINGE | INTRAMUSCULAR | Status: AC
  Administered 2020-10-05: 40 ug
  Filled 2020-10-05: qty 0.4

## 2020-10-05 MED ORDER — PROTAMINE SULFATE 10 MG/ML IV SOLN
INTRAVENOUS | Status: DC | PRN
  Administered 2020-10-05: 50 mg via INTRAVENOUS

## 2020-10-05 MED ORDER — HEPARIN SODIUM (PORCINE) 1000 UNIT/ML DIALYSIS
1000.0000 [IU] | INTRAMUSCULAR | Status: DC | PRN
  Administered 2020-10-05: 1000 [IU] via INTRAVENOUS_CENTRAL

## 2020-10-05 MED ORDER — LIDOCAINE 2% (20 MG/ML) 5 ML SYRINGE
INTRAMUSCULAR | Status: DC | PRN
  Administered 2020-10-05: 100 mg via INTRAVENOUS

## 2020-10-05 MED ORDER — LIDOCAINE HCL (PF) 2 % IJ SOLN
INTRAMUSCULAR | Status: AC
  Filled 2020-10-05: qty 5

## 2020-10-05 MED ORDER — GLYCOPYRROLATE PF 0.2 MG/ML IJ SOSY
PREFILLED_SYRINGE | INTRAMUSCULAR | Status: AC
  Filled 2020-10-05: qty 1

## 2020-10-05 MED ORDER — 0.9 % SODIUM CHLORIDE (POUR BTL) OPTIME
TOPICAL | Status: DC | PRN
  Administered 2020-10-05: 1000 mL

## 2020-10-05 MED ORDER — DARBEPOETIN ALFA 40 MCG/0.4ML IJ SOSY
40.0000 ug | PREFILLED_SYRINGE | Freq: Once | INTRAMUSCULAR | Status: DC
  Filled 2020-10-05: qty 0.4

## 2020-10-05 MED ORDER — ALTEPLASE 2 MG IJ SOLR: 2.0000 mg | Freq: Once | INTRAMUSCULAR | Status: DC | PRN

## 2020-10-05 MED ORDER — OXYCODONE-ACETAMINOPHEN 5-325 MG PO TABS
1.0000 | ORAL_TABLET | ORAL | 0 refills | Status: DC | PRN
Start: 2020-10-05 — End: 2020-10-28

## 2020-10-05 MED ORDER — FUROSEMIDE 10 MG/ML IJ SOLN: 60.0000 mg | Freq: Once | INTRAMUSCULAR | Status: DC

## 2020-10-05 MED ORDER — ORAL CARE MOUTH RINSE: 15.0000 mL | Freq: Once | OROMUCOSAL | Status: AC

## 2020-10-05 MED ORDER — PROPOFOL 10 MG/ML IV BOLUS
INTRAVENOUS | Status: AC
  Filled 2020-10-05: qty 20

## 2020-10-05 MED ORDER — HEPARIN SODIUM (PORCINE) 1000 UNIT/ML IJ SOLN
INTRAMUSCULAR | Status: AC
  Filled 2020-10-05: qty 1

## 2020-10-05 MED ORDER — SODIUM CHLORIDE 0.9 % IV SOLN
INTRAVENOUS | Status: AC
  Filled 2020-10-05: qty 1.2

## 2020-10-05 MED ORDER — ONDANSETRON HCL 4 MG/2ML IJ SOLN
INTRAMUSCULAR | Status: DC | PRN
  Administered 2020-10-05: 4 mg via INTRAVENOUS

## 2020-10-05 MED ORDER — CEFAZOLIN SODIUM-DEXTROSE 2-4 GM/100ML-% IV SOLN
INTRAVENOUS | Status: AC
  Filled 2020-10-05: qty 100

## 2020-10-05 MED ORDER — CHLORHEXIDINE GLUCONATE 0.12 % MT SOLN
OROMUCOSAL | Status: AC
  Administered 2020-10-05: 15 mL via OROMUCOSAL
  Filled 2020-10-05: qty 15

## 2020-10-05 MED ORDER — OXYCODONE-ACETAMINOPHEN 5-325 MG PO TABS
1.0000 | ORAL_TABLET | ORAL | Status: DC | PRN
  Administered 2020-10-08: 1 via ORAL
  Filled 2020-10-05: qty 1

## 2020-10-05 MED ORDER — EPHEDRINE 5 MG/ML INJ
INTRAVENOUS | Status: AC
  Filled 2020-10-05: qty 10

## 2020-10-05 MED ORDER — SODIUM CHLORIDE 0.9 % IV SOLN: INTRAVENOUS | Status: DC | PRN

## 2020-10-05 MED ORDER — CHLORHEXIDINE GLUCONATE 0.12 % MT SOLN: 15.0000 mL | Freq: Once | OROMUCOSAL | Status: AC

## 2020-10-05 MED ORDER — ONDANSETRON HCL 4 MG/2ML IJ SOLN
INTRAMUSCULAR | Status: AC
  Filled 2020-10-05: qty 4

## 2020-10-05 MED ORDER — PHENYLEPHRINE HCL-NACL 10-0.9 MG/250ML-% IV SOLN
INTRAVENOUS | Status: DC | PRN
  Administered 2020-10-05: 25 ug/min via INTRAVENOUS

## 2020-10-05 MED ORDER — FENTANYL CITRATE (PF) 100 MCG/2ML IJ SOLN: 25.0000 ug | INTRAMUSCULAR | Status: DC | PRN

## 2020-10-05 MED ORDER — HEPARIN SODIUM (PORCINE) 1000 UNIT/ML IJ SOLN
INTRAMUSCULAR | Status: DC | PRN
  Administered 2020-10-05: 3800 [IU]

## 2020-10-05 MED ORDER — HEPARIN SODIUM (PORCINE) 1000 UNIT/ML IJ SOLN
INTRAMUSCULAR | Status: DC | PRN
  Administered 2020-10-05: 9000 [IU] via INTRAVENOUS

## 2020-10-05 MED ORDER — PENTAFLUOROPROP-TETRAFLUOROETH EX AERO: 1.0000 "application " | INHALATION_SPRAY | CUTANEOUS | Status: DC | PRN

## 2020-10-05 MED ORDER — FUROSEMIDE 10 MG/ML IJ SOLN: 40.0000 mg | Freq: Once | INTRAMUSCULAR | Status: DC

## 2020-10-05 MED ORDER — LIDOCAINE HCL (PF) 1 % IJ SOLN: 5.0000 mL | INTRAMUSCULAR | Status: DC | PRN

## 2020-10-05 SURGICAL SUPPLY — 59 items
ARMBAND PINK RESTRICT EXTREMIT (MISCELLANEOUS) ×8 IMPLANT
BAG DECANTER FOR FLEXI CONT (MISCELLANEOUS) ×4 IMPLANT
BIOPATCH RED 1 DISK 7.0 (GAUZE/BANDAGES/DRESSINGS) ×3 IMPLANT
BIOPATCH RED 1IN DISK 7.0MM (GAUZE/BANDAGES/DRESSINGS) ×1
CANISTER SUCT 3000ML PPV (MISCELLANEOUS) ×4 IMPLANT
CANNULA VESSEL 3MM 2 BLNT TIP (CANNULA) ×4 IMPLANT
CATH PALINDROME-P 19CM W/VT (CATHETERS) IMPLANT
CATH PALINDROME-P 23CM W/VT (CATHETERS) ×2 IMPLANT
CATH PALINDROME-P 28CM W/VT (CATHETERS) IMPLANT
CHLORAPREP W/TINT 26 (MISCELLANEOUS) ×4 IMPLANT
CLIP VESOCCLUDE MED 6/CT (CLIP) ×4 IMPLANT
CLIP VESOCCLUDE SM WIDE 6/CT (CLIP) ×4 IMPLANT
COVER PROBE W GEL 5X96 (DRAPES) ×2 IMPLANT
COVER SURGICAL LIGHT HANDLE (MISCELLANEOUS) ×6 IMPLANT
COVER WAND RF STERILE (DRAPES) ×4 IMPLANT
DECANTER SPIKE VIAL GLASS SM (MISCELLANEOUS) ×4 IMPLANT
DERMABOND ADVANCED (GAUZE/BANDAGES/DRESSINGS) ×4
DERMABOND ADVANCED .7 DNX12 (GAUZE/BANDAGES/DRESSINGS) ×2 IMPLANT
DRAPE C-ARM 42X72 X-RAY (DRAPES) ×4 IMPLANT
DRAPE CHEST BREAST 15X10 FENES (DRAPES) ×4 IMPLANT
ELECT REM PT RETURN 9FT ADLT (ELECTROSURGICAL) ×4
ELECTRODE REM PT RTRN 9FT ADLT (ELECTROSURGICAL) ×2 IMPLANT
GAUZE 4X4 16PLY RFD (DISPOSABLE) ×4 IMPLANT
GLOVE BIO SURGEON STRL SZ 6.5 (GLOVE) ×2 IMPLANT
GLOVE BIO SURGEON STRL SZ7.5 (GLOVE) ×4 IMPLANT
GLOVE BIO SURGEONS STRL SZ 6.5 (GLOVE) ×2
GLOVE BIOGEL PI IND STRL 6.5 (GLOVE) IMPLANT
GLOVE BIOGEL PI IND STRL 8 (GLOVE) ×2 IMPLANT
GLOVE BIOGEL PI INDICATOR 6.5 (GLOVE) ×2
GLOVE BIOGEL PI INDICATOR 8 (GLOVE) ×2
GLOVE ECLIPSE 6.5 STRL STRAW (GLOVE) ×2 IMPLANT
GLOVE INDICATOR 7.0 STRL GRN (GLOVE) ×2 IMPLANT
GOWN STRL REUS W/ TWL LRG LVL3 (GOWN DISPOSABLE) ×6 IMPLANT
GOWN STRL REUS W/TWL LRG LVL3 (GOWN DISPOSABLE) ×20
KIT BASIN OR (CUSTOM PROCEDURE TRAY) ×4 IMPLANT
KIT PALINDROME-P 55CM (CATHETERS) IMPLANT
KIT TURNOVER KIT B (KITS) ×4 IMPLANT
NDL 18GX1X1/2 (RX/OR ONLY) (NEEDLE) ×2 IMPLANT
NDL HYPO 25GX1X1/2 BEV (NEEDLE) ×2 IMPLANT
NEEDLE 18GX1X1/2 (RX/OR ONLY) (NEEDLE) ×4 IMPLANT
NEEDLE HYPO 25GX1X1/2 BEV (NEEDLE) ×4 IMPLANT
NS IRRIG 1000ML POUR BTL (IV SOLUTION) ×4 IMPLANT
PACK CV ACCESS (CUSTOM PROCEDURE TRAY) ×4 IMPLANT
PACK SURGICAL SETUP 50X90 (CUSTOM PROCEDURE TRAY) ×4 IMPLANT
PAD ARMBOARD 7.5X6 YLW CONV (MISCELLANEOUS) ×8 IMPLANT
SPONGE SURGIFOAM ABS GEL 100 (HEMOSTASIS) IMPLANT
SUT ETHILON 3 0 PS 1 (SUTURE) ×4 IMPLANT
SUT PROLENE 6 0 BV (SUTURE) ×6 IMPLANT
SUT VIC AB 3-0 SH 27 (SUTURE) ×4
SUT VIC AB 3-0 SH 27X BRD (SUTURE) ×2 IMPLANT
SUT VICRYL 4-0 PS2 18IN ABS (SUTURE) ×6 IMPLANT
SYR 10ML LL (SYRINGE) ×4 IMPLANT
SYR 20ML LL LF (SYRINGE) ×8 IMPLANT
SYR 5ML LL (SYRINGE) ×8 IMPLANT
SYR CONTROL 10ML LL (SYRINGE) ×4 IMPLANT
TOWEL GREEN STERILE (TOWEL DISPOSABLE) ×8 IMPLANT
TOWEL GREEN STERILE FF (TOWEL DISPOSABLE) ×4 IMPLANT
UNDERPAD 30X36 HEAVY ABSORB (UNDERPADS AND DIAPERS) ×4 IMPLANT
WATER STERILE IRR 1000ML POUR (IV SOLUTION) ×4 IMPLANT

## 2020-10-05 NOTE — Anesthesia Preprocedure Evaluation (Addendum)
Anesthesia Evaluation  Patient identified by MRN, date of birth, ID band Patient awake    Reviewed: Allergy & Precautions, NPO status , Patient's Chart, lab work & pertinent test results  Airway Mallampati: II  TM Distance: >3 FB Neck ROM: Full    Dental no notable dental hx.    Pulmonary neg pulmonary ROS, former smoker,    Pulmonary exam normal breath sounds clear to auscultation       Cardiovascular hypertension, + CAD and + Past MI  Normal cardiovascular exam+ Valvular Problems/Murmurs AI  Rhythm:Regular Rate:Normal     Neuro/Psych negative neurological ROS  negative psych ROS   GI/Hepatic negative GI ROS, Neg liver ROS,   Endo/Other  diabetesHypothyroidism   Renal/GU DialysisRenal disease  negative genitourinary   Musculoskeletal negative musculoskeletal ROS (+)   Abdominal   Peds negative pediatric ROS (+)  Hematology  (+) anemia ,   Anesthesia Other Findings   Reproductive/Obstetrics negative OB ROS                             Anesthesia Physical Anesthesia Plan  ASA: IV  Anesthesia Plan: General   Post-op Pain Management:    Induction: Intravenous  PONV Risk Score and Plan: 2 and Ondansetron and Treatment may vary due to age or medical condition  Airway Management Planned: LMA  Additional Equipment:   Intra-op Plan:   Post-operative Plan: Extubation in OR  Informed Consent: I have reviewed the patients History and Physical, chart, labs and discussed the procedure including the risks, benefits and alternatives for the proposed anesthesia with the patient or authorized representative who has indicated his/her understanding and acceptance.     Dental advisory given  Plan Discussed with: CRNA and Surgeon  Anesthesia Plan Comments:        Anesthesia Quick Evaluation

## 2020-10-05 NOTE — Progress Notes (Signed)
Plan left arm AV fistula using the basilic vein as well as exchange of right IJ temporary catheter for tunneled dialysis catheter today with Dr. Scot Dock.  Plan discussed with the patient as well as his wife and daughter yesterday.  Dr. Donnetta Hutching was previously planning doing his basilic vein in one stage but I discussed this would likely be done in two stages given his recent NSTEMI to limit his time in the OR under anesthesia and also since he has been requiring heparin anticoagulation.   Marty Heck, MD [facility] [place] Office: [place]

## 2020-10-05 NOTE — Discharge Instructions (Signed)
Thoracentesis, Care After This sheet gives you information about how to care for yourself after your procedure. Your health care provider may also give you more specific instructions. If you have problems or questions, contact your health care provider. What can I expect after the procedure? After your procedure, it is common to have some pain at the site where the needle was inserted (puncture site). Follow these instructions at home:  Care of the puncture site  Follow instructions from your health care provider about how to take care of your puncture site. Make sure you: ? Wash your hands with soap and water before you change your bandage (dressing). If soap and water are not available, use hand sanitizer. ? Change your dressing as told by your health care provider.  Check the puncture site every day for signs of infection. Check for: ? Redness, swelling, or pain. ? Fluid or blood. ? Warmth. ? Pus or a bad smell.  Do not take baths, swim, or use a hot tub until your health care provider approves. General instructions  Take over-the-counter and prescription medicines only as told by your health care provider.  Do not drive for 24 hours if you were given a medicine to help you relax (sedative) during your procedure.  Drink enough fluid to keep your urine pale yellow.  You may return to your normal diet and normal activities as told by your health care provider.  Keep all follow-up visits as told by your health care provider. This is important. Contact a health care provider if you:  Have redness, swelling, or pain at your puncture site.  Have fluid or blood coming from your puncture site.  Notice that your puncture site feels warm to the touch.  Have pus or a bad smell coming from your puncture site.  Have a fever.  Have chills.  Have nausea or vomiting.  Have trouble breathing.  Develop a worsening cough. Get help right away if you:  Have extreme shortness of  breath.  Develop chest pain.  Faint or feel light-headed. Summary  After your procedure, it is common to have some pain at the site where the needle was inserted (puncture site).  Wash your hands with soap and water before you change your bandage (dressing).  Check your puncture site every day for signs of infection.  Take over-the-counter and prescription medicines only as told by your health care provider. This information is not intended to replace advice given to you by your health care provider. Make sure you discuss any questions you have with your health care provider. Document Revised: 10/12/2017 Document Reviewed: 09/24/2017 Elsevier Patient Education  2020 [employer].    [facility]  Discharge Instructions  AV Fistula or Graft Surgery for Dialysis Access  Please refer to the following instructions for your post-procedure care. Your surgeon or physician assistant will discuss any changes with you.  Activity  You may drive the day following your surgery, if you are comfortable and no longer taking prescription pain medication. Resume full activity as the soreness in your incision resolves.  Bathing/Showering  You may shower after you go home. Keep your incision dry for 48 hours. Do not soak in a bathtub, hot tub, or swim until the incision heals completely. You may not shower if you have a hemodialysis catheter.  Incision Care  Clean your incision with mild soap and water after 48 hours. Pat the area dry with a clean towel. You do not need a  bandage unless otherwise instructed. Do not apply any ointments or creams to your incision. You may have skin glue on your incision. Do not peel it off. It will come off on its own in about one week. Your arm may swell a bit after surgery. To reduce swelling use pillows to elevate your arm so it is above your heart. Your doctor will tell you if you need to lightly wrap your arm with an ACE  bandage.  Diet  Resume your normal diet. There are not special food restrictions following this procedure. In order to heal from your surgery, it is CRITICAL to get adequate nutrition. Your body requires vitamins, minerals, and protein. Vegetables are the best source of vitamins and minerals. Vegetables also provide the perfect balance of protein. Processed food has little nutritional value, so try to avoid this.  Medications  Resume taking all of your medications. If your incision is causing pain, you may take over-the counter pain relievers such as acetaminophen (Tylenol). If you were prescribed a stronger pain medication, please be aware these medications can cause nausea and constipation. Prevent nausea by taking the medication with a snack or meal. Avoid constipation by drinking plenty of fluids and eating foods with high amount of fiber, such as fruits, vegetables, and grains.   Do not take Tylenol if you are taking prescription pain medications.  Follow up Your surgeon may want to see you in the office following your access surgery. If so, this will be arranged at the time of your surgery.  Please call us immediately for any of the following conditions:  . Increased pain, redness, drainage (pus) from your incision site . Fever of 101 degrees or higher . Severe or worsening pain at your incision site . Hand pain or numbness. .  Reduce your risk of vascular disease:  . Stop smoking. If you would like help, call [facility] at 1-800-QUIT-NOW 712-017-7299) or [place] at 718-789-4442  . Manage your cholesterol . Maintain a desired weight . Control your diabetes . Keep your blood pressure down  Dialysis  It will take several weeks to several months for your new dialysis access to be ready for use. Your surgeon will determine when it is okay to use it. Your nephrologist will continue to direct your dialysis. You can continue to use your Permcath until your new access is ready  for use.   10/05/2020 Michael Trevino [id number] 08/18/1941  Surgeon(s): Angelia Mould, MD  Procedure(s): LEFT ARM FIRST STAGE BASILIC  ARTERIOVENOUS (AV) FISTULA CREATION INSERTION OF RIGHT INTERNAL JUGULAR TUNNELED DIALYSIS CATHETER   May stick graft immediately   May stick graft on designated area only:   X Do not stick left av fistula for 12 weeks    If you have any questions, please call the office at [phone].

## 2020-10-05 NOTE — Procedures (Addendum)
Seen and examined on dialysis.  Blood pressure 144/50 and HR 75.  Tolerating goal.  Acceptable BF through RIJ nontunneled catheter.  He is finishing his second unit of blood now  Claudia Desanctis, MD 10/05/2020 10:38 AM

## 2020-10-05 NOTE — Progress Notes (Addendum)
Patient has been accepted at [facility] on a TTS schedule with a seat time of 10:45am. On his first day at the clinic, he needs to arrive at 10:15am to sign intake paperwork. Due to Thanksgiving on Thursday, the first day that the clinic can accommodate patient's start will be Saturday, 10/09/20 if he is medically ready for discharge by Friday, 11/26. Michael Trevino needs to be contacted at [phone] to notify of patient's start date and discharge summary faxed to [phone].  Nephrologist/Dr. Royce Macadamia and Attending/Dr. Posey Pronto updated. Navigator left message for patient's daughter/Michael Trevino to update. Navigator spoke with son/Michael Trevino [phone] to inform. He was very Patent attorney. Per Fritz Pickerel, family is not interested in SNF if that were to be recommended, but they would be interested in home health, if that was recommended. Navigator sent secure message to update [facility] team.  Alphonzo Cruise, [place] [facility] Navigator [phone]

## 2020-10-05 NOTE — Progress Notes (Signed)
PROGRESS NOTE    [name]  ZOX:[id number] DOB: 11-May-1941 DOA: 09/30/2020  PCP: Dettinger, Fransisca Kaufmann, MD    Brief Narrative:   Michael Trevino  is a 79 y.o. male with past medical history relevant for HTN, DM2, CKD 5 with nephrotic syndrome,, hypothyroidism, CAD with prior MI, and HLD and obesity who initially presented on 09/30/2020 to the surgical unit for creation of a left arm AV fistula by vascular surgeon Dr. Curt Jews--- however patient complained of some chest discomfort and shortness of breath, so his procedure was canceled, and patient was sent to the ED for work-up for his shortness of breath -Patient was very short of breath and hypoxic required continuous BiPAP initially, also requiring IV nitro drip for elevated BP and CHF flareup -Patient received IV Lasix 80 mg x 2 with very limited output --Chest x-ray in the ED showed bilateral pleural effusion left more than right --Patient underwent successful ultrasound-guided thoracentesis on 09/30/2020 with removal of 1 L from the left pleural space--- studies pending -After thoracentesis patient was eventually able to come off BiPAP, and transitioned to nasal cannula --- Echo from 81/19/1478 with diastolic dysfunction No fever  Or chills  -No productive cough No nausea, vomiting, diarrhea , denies pleuritic symptoms does have leg swelling In ED hemoglobin is 9.6 which is not far from his recent baseline, WBC 7.7 and platelets 194 --Creatinine is up to 5.5, with a bicarb of 18, potassium is 4.4 and BUN is 66 -LFTs are not elevated -BNP is elevated at 1022 and chest x-ray consistent with CHF with effusions -Troponin is 194 in the setting of renal failure   Assessment & Plan:  HFpEF--patient with acute on chronic diastolic dysfunction CHF exacerbation -echo from 09/30/2020 with EF of 55 to  60%, with severe LVH, small pericardial effusion without significant valvular abnormalities and no significant wall motion  abnormalities ----Chest x-ray on admission with CHF and bilateral pleural effusion left more than right --Patient underwent successful ultrasound-guided thoracentesis on 09/30/2020 with removal of 1 L from the left pleural space--- studies pending -After thoracentesis patient was eventually able to come off BiPAP, and transitioned to nasal cannula --BNP is elevated at 1022 ,-Troponin is 194 in the setting of renal failure -Continue to dose high-dose IV Lasix per nephrology service -Patient will need to initiate hemodialysis to address his volume status. -Pt is on 4 L [place] and notes that his legs are less swollen .  --pt seen today he is alert awake and oriented and cardiology dr.lambert at bedside with plan for LHC in am and we will make pt npo after midnight.   --pt s/p cardiac cath and found obstructive cad with plan for conservative medical management, pt continued on  Metoprolol, hydralazine,lipitro, and amlodipine.--  Intake/Output Summary (Last 24 hours) at 10/05/2020 1732 Last data filed at 10/05/2020 1458 Gross per 24 hour  Intake 1633 ml  Output 3135 ml  Net -1502 ml     Chronic anemia of CKD-- In ED hemoglobin is 9.6 which is not far from his recent baseline--no evidence of ongoing bleeding, -EPO/ESA agent per nephrology service. -Hb is 8.3 we will follow.  --h/h is 7.4 and has been stable at 7. Will transfuse one unit during dialysis, will d/w Nephro MD. Pt s/p 2 units of prbc and iron.   CKD IV/ESRD / HD-- --Creatinine is up to 5.5, with a bicarb of 18, potassium is 4.4 and BUN is 66 -Patient is clearly volume overloaded clinically and radiologically -Abdomen seizures  above #1 --Timing of initiation of hemodialysis per nephrology service. -greatly appreciate consult . Lab Results  Component Value Date        CREATININE 4.57 (H) 10/02/2020   CREATININE 5.96 (H) 10/01/2020   --pt seen today and creatinine is stable. He is tolerating dialysis and further rec per  nephrology.  -- Lab Results  Component Value Date   CREATININE 4.48 (H) 10/04/2020   CREATININE 4.35 (H) 10/03/2020   CREATININE 4.57 (H) 10/02/2020   - Lab Results  Component Value Date   CREATININE 3.40 (H) 10/05/2020   CREATININE 6.10 (H) 10/05/2020   CREATININE 4.48 (H) 10/04/2020     HTN--much improved with IV nitro, okay to transition off IV nitro to p.o. amlodipine 10 mg daily and hydralazine 50 mg 3 times daily -Use IV labetalol as needed persistent elevated BP once transitioned off IV nitro Blood pressure (!) 152/48, pulse 90, temperature 97.8 F (36.6 C), resp. rate (!) 21, height 5\' 9"  (1.753 m), weight 100.8 kg, SpO2 94 %. prn hydralazine.  Blood pressure (!) 151/59, pulse 80, temperature 98 F (36.7 C), temperature source Oral, resp. rate 18, height 5\' 9"  (1.753 m), weight 100.1 kg, SpO2 98 %. PRN hydralazine.   Blood pressure (!) 138/53, pulse 79, temperature 98.9 F (37.2 C), temperature source Oral, resp. rate 20, height 5\' 9"  (1.753 m), weight 99.6 kg, SpO2 95 %. No change in regimen cont BB/AMlodipine and hydralazine.  Blood pressure (!) 152/48, pulse 90, temperature 97.8 F (36.6 C), resp. rate (!) 21, height 5\' 9"  (1.753 m), weight 100.8 kg, SpO2 94 %.    Acute hypoxic respiratory failure--- in the setting of volume overload in a patient with worsening CKD 5, as well as acute on chronic diastolic CHF exacerbation with bilateral left more than right pleural effusions--oxygenation improved after left-sided thoracentesis with removal of 1 L -Continue supplemental oxygen, patient has been able to come off BiPAP -Anticipate improvement in oxygenation with improvement in CHF and after initiation of hemodialysis to address volume status. -SpO2: 94 % O2 Flow Rate (L/min): 2 L/min -VBG pending. -Echo results as follow: IMPRESSION: 1. Left ventricular ejection fraction, by estimation, is 55 to 60%. The  left ventricle has normal function. The left ventricle  has no regional  wall motion abnormalities. There is severe left ventricular hypertrophy.  Left ventricular diastolic parameters  are indeterminate. Elevated left atrial pressure.  2. Right ventricular systolic function is normal. The right ventricular  size is normal.  3. A small pericardial effusion is present. The pericardial effusion is  circumferential. There is no evidence of cardiac tamponade. Large pleural  effusion in the left lateral region.  4. The mitral valve is normal in structure. Trivial mitral valve  regurgitation. No evidence of mitral stenosis.  5. The aortic valve was not well visualized. There is mild calcification  of the aortic valve. There is mild thickening of the aortic valve. Aortic  valve regurgitation is moderate. Moderate aortic valve stenosis.  6. The inferior vena cava is normal in size with greater than 50%  respiratory variability, suggesting right atrial pressure of 3 mmHg.  VBg shows hypoxia. Will get bipap at bedside.  -- SpO2: 94 % O2 Flow Rate (L/min): 2 L/min No change in oxygen requirement we will continue to follow pt still reports sob and I expect will improve once he is euvolemic.    --SpO2: 95 % O2 Flow Rate (L/min): 6 L/min. Pt is alert and on [place] today he denies any  sob or chest pressure or pain.  --SpO2: 94 % O2 Flow Rate (L/min): 3 L/min Pt is breathing better .   CAD/HLD--continue Lipitor, aspirin, and metoprolol -troponin as above #1 -Currently chest pain-free. 10/02/20 Continue Lipitor, aspirin,metoprolol, troponin. Results for CARRINGTON, OLAZABAL (MRN [id number]) as of 10/02/2020 08:48  Ref. Range 10/02/2020 03:40  Total CHOL/HDL Ratio Latest Units: RATIO 3.8  Cholesterol Latest Ref Range: 0 - 200 mg/dL 147  HDL Cholesterol Latest Ref Range: >40 mg/dL 39 (L)  LDL (calc) Latest Ref Range: 0 - 99 mg/dL 93  Triglycerides Latest Ref Range: <150 mg/dL 74  VLDL Latest Ref Range: 0 - 40 mg/dL 15  LDL is not at goal and neither  is hdl. Pt will need to change to Crestor on discharge and we will increase Lipitor for now.  --Continue amlodipine.  DM2-A1c 6.7 reflecting excellent diabetic control PTA -Use Novolog/Humalog Sliding scale insulin with Accu-Cheks/Fingersticks as ordered. --continue control with glycemic protocol.   Hypothyroidism--continue levothyroxine 175 mcg daily. 10/02/20 Continue levothyroxine at 175 mcg daily. Patient to be taken on an empty stomach in the morning. Thyroid function test pending. No change to levothyroxine.    DVT prophylaxis:  heparin  Disposition Plan: TBD  Consultants:  Cardiology Nephrology  Procedures:  none  Subjective/Overview: Pt admitted with chf exacerbation ,currently stable on 4L [place]. Pt admitted on the 18th, seen by cardiology and started on heparin drip. Plan per cardiology is for left heart cath once dialysis access is established. Pt seen by nephrology Dr.webb and Pt had HD access placed on 10/01/20 by IR. Pt had HD with 1L removed yesterday.pt is alert,awake and oriented.  It seen today he is post cath found to have obstructive CAD and Medical management with optimization once pt stabilizes.  Pt is stable s/p HD and  Has dialysis catheter placed,LEFT ARM FIRST STAGE BASILIC  ARTERIOVENOUS (AV) FISTULA CREATION (Left ) INSERTION OF RIGHT INTERNAL JUGULAR TUNNELED DIALYSIS CATHETER (Right )  Objective: Vitals:   10/04/20 0815 10/04/20 0820 10/04/20 0825 10/04/20 0841  BP: (!) 141/68 (!) 153/70  (!) 148/56  Pulse: 86 88 (!) 0 82  Resp: 18 18 (!) 0 17  Temp:      TempSrc:      SpO2: 96% 97% (!) 0% 96%  Weight:      Height:       SpO2: 94 % O2 Flow Rate (L/min): 2 L/min  Intake/Output Summary (Last 24 hours) at 10/05/2020 1732 Last data filed at 10/05/2020 1458 Gross per 24 hour  Intake 1633 ml  Output 3135 ml  Net -1502 ml   Filed Weights   10/04/20 0400 10/05/20 0500 10/05/20 0710  Weight: 99.6 kg 102 kg 100.8 kg     Examination: Blood pressure (!) 148/56, pulse 82, temperature 98.6 F (37 C), temperature source Oral, resp. rate 17, height 5\' 9"  (1.753 m), weight 99.6 kg, SpO2 96 %. General exam: Appears calm and comfortable  HEENT:EOMI, perrl  Respiratory system: Clear to auscultation. Respiratory effort normal. Cardiovascular system: S1 & S2 heard, RRR. No JVD, murmurs, rubs, gallops or clicks. No pedal edema. Gastrointestinal system: Abdomen is nondistended, soft and nontender. No organomegaly or masses felt. Normal bowel sounds heard. Central nervous system: Alert and oriented.CN grossly intact No focal neurological deficits. Extremities: pt moving all 4 ext and ambulating. Skin: No rashes, lesions or ulcers Psychiatry: Judgement and insight appear normal. Mood & affect appropriate.     Data Reviewed: I have personally reviewed following labs and imaging studies  Labs  No results for input(s): CKTOTAL, CKMB, TROPONINI in the last 72 hours. Lab Results  Component Value Date   WBC 5.7 10/04/2020   HGB 7.4 (L) 10/04/2020   HCT 23.4 (L) 10/04/2020   MCV 91.8 10/04/2020   PLT 136 (L) 10/04/2020    Recent Labs  Lab 09/30/20 0654 10/01/20 0417 10/04/20 0615  NA 142   < > 138  K 4.4   < > 3.9  CL 113*   < > 99  CO2 18*   < > 27  BUN 66*   < > 31*  CREATININE 5.56*   < > 4.48*  CALCIUM 8.2*   < > 7.6*  PROT 7.7  --   --   BILITOT 0.5  --   --   ALKPHOS 58  --   --   ALT 16  --   --   AST 15  --   --   GLUCOSE 141*   < > 88   < > = values in this interval not displayed.   Lab Results  Component Value Date   CHOL 147 10/02/2020   HDL 39 (L) 10/02/2020   LDLCALC 93 10/02/2020   TRIG 74 10/02/2020   Lab Results  Component Value Date   DDIMER 2.70 (H) 09/23/2019   Invalid input(s): POCBNP  Radiology Studies: CARDIAC CATHETERIZATION  Result Date: 10/04/2020  Ost RCA to Prox RCA lesion is 100% stenosed.  Mid LAD lesion is 50% stenosed.  Michael Trevino is a 79 y.o. male   [id number] LOCATION:  FACILITY: [facility] PHYSICIAN: Quay Burow, M.D. 1940-12-06 DATE OF PROCEDURE:  10/04/2020 DATE OF DISCHARGE: CARDIAC CATHETERIZATION History obtained from chart review.79 y.o. male with known CAD admitted with NSTEMI. EF remains normal on TTE this admission.  He does have moderate AI.  He has a temporary dialysis catheter in place and will ultimately need an AV fistula placed for long-term hemodialysis.  He was referred for diagnostic coronary angiography because of chest pain shortness of breath and positive enzymes.   Mr. Blanchfield  has an occluded RCA (CTO) with grade 3 left-to-right collaterals.  He has a 50% mid LAD lesion.  His LVEDP was only mildly elevated at 19.  He was admitted with chest pain and shortness of breath.  His BNP was elevated and his enzymes peaked at about 3000.  I believe this was demand ischemia from volume overload and collateral insufficiency.  Medical therapy will be recommended.  He will need an AV fistula placed once he is hemodynamically stable.  A right common femoral angiogram was performed and a Mynx closure device was successfully deployed achieving hemostasis.  The patient left lab in stable condition. Quay Burow. MD, [facility] 10/04/2020 8:38 AM       Anti-infectives (From admission, onward)   Start     Dose/Rate Route Frequency Ordered Stop   10/05/20 1307  ceFAZolin (ANCEF) 2-4 GM/100ML-% IVPB       Note to Pharmacy: [name]   : cabinet override      10/05/20 1307 10/06/20 0114       Continuous Infusions: . sodium chloride    . sodium chloride    . sodium chloride    . iron sucrose 500 mg (10/03/20 1225)  . nitroGLYCERIN 5 mcg/min (10/04/20 9983)     LOS: 5 days    Para Skeans, MD [facility] Pager [phone] How to contact the [facility] Attending or Consulting provider [place] or covering provider during  after hours 7P -7A, for this patient?    1. Check the care team in [facility] and look for a) attending/consulting TRH  provider listed and b) the [facility] team listed 2. Log into [email or web address] and use [facility]'s universal password to access. If you do not have the password, please contact the hospital operator. 3. Locate the [facility] provider you are looking for under [facility] and page to a number that you can be directly reached. 4. If you still have difficulty reaching the provider, please page the [facility] (Director on Call) for the Hospitalists listed on amion for assistance. [email or web address] Password [facility] 10/05/2020, 5:32 PM

## 2020-10-05 NOTE — H&P (View-Only) (Signed)
Plan left arm AV fistula using the basilic vein as well as exchange of right IJ temporary catheter for tunneled dialysis catheter today with Dr. Scot Dock.  Plan discussed with the patient as well as his wife and daughter yesterday.  Dr. Donnetta Hutching was previously planning doing his basilic vein in one stage but I discussed this would likely be done in two stages given his recent NSTEMI to limit his time in the OR under anesthesia and also since he has been requiring heparin anticoagulation.   Marty Heck, MD [facility] [place] Office: [place]

## 2020-10-05 NOTE — Plan of Care (Signed)
Contacted by HD SW and patient has been accepted to an outpatient unit for starting there as early as 11/27 when he is medically cleared.  [facility] with 10:45 chair time.    At this time needs to remain inpatient as he cannot start there until 11/27.  Just had AVF and tunneled HD catheter as well as 2 units of blood today.  Next HD here is currently scheduled on 11/26.  When he is discharged, nephrology will need to call [name] to notify them and to fax them the discharge summary.    [name] phone:  [phone] Fax: [phone]  Claudia Desanctis, MD 10/05/2020 5:13 PM

## 2020-10-05 NOTE — Anesthesia Procedure Notes (Signed)
Procedure Name: LMA Insertion Date/Time: 10/05/2020 1:17 PM Performed by: Clearnce Sorrel, CRNA Pre-anesthesia Checklist: Patient identified, Emergency Drugs available, Suction available and Patient being monitored Patient Re-evaluated:Patient Re-evaluated prior to induction Oxygen Delivery Method: Circle System Utilized Preoxygenation: Pre-oxygenation with 100% oxygen Induction Type: IV induction Ventilation: Mask ventilation without difficulty LMA: LMA inserted LMA Size: 4.0 Number of attempts: 1 Airway Equipment and Method: Bite block Placement Confirmation: positive ETCO2 and breath sounds checked- equal and bilateral Tube secured with: Tape Dental Injury: Teeth and Oropharynx as per pre-operative assessment

## 2020-10-05 NOTE — Interval H&P Note (Signed)
History and Physical Interval Note:  10/05/2020 12:37 PM  Michael Trevino  has presented today for surgery, with the diagnosis of ESRD.  The various methods of treatment have been discussed with the patient and family. After consideration of risks, benefits and other options for treatment, the patient has consented to  Procedure(s): LEFT ARM ARTERIOVENOUS (AV) FISTULA CREATION (Left) INSERTION OF DIALYSIS CATHETER (N/A) as a surgical intervention.  The patient's history has been reviewed, patient examined, no change in status, stable for surgery.  I have reviewed the patient's chart and labs.  Questions were answered to the patient's satisfaction.     Deitra Mayo

## 2020-10-05 NOTE — Plan of Care (Signed)

## 2020-10-05 NOTE — Op Note (Signed)
    NAME: Michael Trevino    MRN: [id number] DOB: August 08, 1941    DATE OF OPERATION: 10/05/2020  PREOP DIAGNOSIS:    End-stage renal disease  POSTOP DIAGNOSIS:    Same  PROCEDURE:    Placement of right IJ tunneled dialysis catheter (23 cm) For stage left basilic vein transposition  SURGEON: Judeth Cornfield. Scot Dock, MD  ASSIST: Karoline Caldwell, PA  ANESTHESIA: General  EBL: Minimal  INDICATIONS:    Michael Trevino is a 79 y.o. male who presents for new access.  He has a temporary right IJ tunneled dialysis catheter  FINDINGS:   Good thrill in the fistula at the completion of the procedure with a palpable radial pulse  TECHNIQUE:   The patient was brought to the operating room and received a general anesthetic.  The neck and upper chest were prepped and draped in usual sterile fashion.  The old catheter was prepped into the field.  I selected a 23 cm catheter.  The exit site for the catheter was selected and a tunnel created from this site to the temporary catheter site.  Next I removed the old catheter over a wire and dilated the tract and then placed the dilator and peel-away sheath over the wire.  The wire and dilator were removed.  I then passed the 23 cm catheter through the peel-away sheath and positioned this at the cavoatrial junction.  Both ports withdrew easily with and flushed with heparinized saline.  The catheter was secured at its exit site with a 3-0 nylon suture.  The IJ cannulation site was closed with 4-0 subcuticular stitch.  Sterile dressing was applied.  Attention was then turned to the left arm.  The left arm was prepped and draped in usual sterile fashion.  Longitudinal incision was made over the basilic vein just above the antecubital level.  Here the vein was dissected free.  Branches were divided between clips and 3-0 silk ties.  The vein was ligated distally.  Irrigated up nicely with heparinized saline.  It was a 3.5 mm vein.  The brachial artery was dissected free  beneath the fascia.  Patient was heparinized.  The brachial artery was clamped proximally and distally and a longitudinal arteriotomy was made.  The vein was spatulated and sewn end-to-side to the artery using continuous 6-0 Prolene suture.  At the completion of the good thrill in the fistula and a palpable radial pulse.  The heparin was partially reversed with protamine.  The wound was closed with a deep layer of 3-0 Vicryl skin closed with 4-0 Vicryl.  Dermabond was applied.  Patient tolerated the procedure well was transferred to recovery room in stable condition.  All needle and sponge counts were correct.  Given the complexity of the case a first assistant was necessary in order to expedient the procedure and safely perform the technical aspects of the operation.  Deitra Mayo, MD, FACS [facility]  DATE OF DICTATION:   10/05/2020

## 2020-10-05 NOTE — Anesthesia Postprocedure Evaluation (Signed)
Anesthesia Post Note  Patient: Michael Trevino  Procedure(s) Performed: LEFT ARM FIRST STAGE BASILIC  ARTERIOVENOUS (AV) FISTULA CREATION (Left ) INSERTION OF RIGHT INTERNAL JUGULAR TUNNELED DIALYSIS CATHETER (Right )     Patient location during evaluation: PACU Anesthesia Type: General Level of consciousness: awake and alert Pain management: pain level controlled Vital Signs Assessment: post-procedure vital signs reviewed and stable Respiratory status: spontaneous breathing, nonlabored ventilation, respiratory function stable and patient connected to nasal cannula oxygen Cardiovascular status: blood pressure returned to baseline and stable Postop Assessment: no apparent nausea or vomiting Anesthetic complications: no   No complications documented.  Last Vitals:  Vitals:   10/05/20 1548 10/05/20 1603  BP: (!) 144/50 (!) 152/48  Pulse: 91 90  Resp: (!) 23 (!) 21  Temp:  36.6 C  SpO2: 96% 94%    Last Pain:  Vitals:   10/05/20 1603  TempSrc:   PainSc: 0-No pain                 Jo-Ann Johanning S

## 2020-10-05 NOTE — Progress Notes (Signed)
Renal Navigator faxed HepB lab results to [facility] and followed up to ensure referral is in process. Admissions Coordinator confirmed that it is being worked on and will follow up.  Alphonzo Cruise, [facility] Renal Navigator [phone]

## 2020-10-05 NOTE — Progress Notes (Signed)
[facility] Progress Note  Name: Michael Trevino MRN: [id number] DOB: 05-06-1941  Subjective:  Had last HD on 11/21 with 3kg UF charted.  Had 125 mL charted UOP over 11/22.  Vascular has seen and is planning for OR today for tunn catheter and AVF.  Per floor this is scheduled for later today - 11:40 and he has just been brought up to HD.  Spoke with the patient and discussed risks/benefits/indications for PRBC's - he consents to blood products.  He consents for aranesp    Review of systems:    Denies shortness of breath - states breathing is much better Denies current chest pain  Denies n/v/d   Intake/Output Summary (Last 24 hours) at 10/05/2020 0652 Last data filed at 10/04/2020 2135 Gross per 24 hour  Intake 203 ml  Output 125 ml  Net 78 ml    Vitals:  Vitals:   10/04/20 2129 10/04/20 2315 10/05/20 0331 10/05/20 0500  BP: (!) 131/53 (!) 146/49 (!) 133/53   Pulse: 81 73 81   Resp:  20 (!) 22   Temp:  99.5 F (37.5 C) 99.4 F (37.4 C)   TempSrc:  Oral Oral   SpO2:  95% 92%   Weight:    102 kg  Height:         Physical Exam:   General adult male in bed in no acute distress HEENT normocephalic atraumatic extraocular movements intact sclera anicteric Neck supple trachea midline Lungs clear to auscultation bilaterally normal work of breathing at rest  Heart S1S2 no rub appreciated Abdomen soft nontender obese habitus  Extremities trace edema lower extremities Psych normal mood and affect Neuro alert and oriented provides hx and follows commands Access RIJ nontunneled catheter   Medications reviewed   Labs:  BMP Latest Ref Rng & Units 10/05/2020 10/04/2020 10/03/2020  Glucose 70 - 99 mg/dL 85 88 87  BUN 8 - 23 mg/dL 46(H) 31(H) 39(H)  Creatinine 0.61 - 1.24 mg/dL 6.10(H) 4.48(H) 4.35(H)  BUN/Creat Ratio 10 - 24 - - -  Sodium 135 - 145 mmol/L 135 138 140  Potassium 3.5 - 5.1 mmol/L 3.9 3.9 4.0  Chloride 98 - 111 mmol/L 96(L) 99 105  CO2 22 - 32  mmol/L 25 27 25   Calcium 8.9 - 10.3 mg/dL 7.3(L) 7.6(L) 7.3(L)     Assessment/Plan:    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  had third inpatient HD tx on 11/21.    Renal navigator aware - CLIP started.  Ordered hep B serologies per [facility] policy (his closes unit is [facility])  Consulted [facility]VS for tunneled catheter and permanent access - appreciate their assistance  Have ordered strict ins/outs.  Plan next HD on 11/23 - can do after OR with vascular  Acute hypoxic respiratory failure with large left pleural effusion   Left Pleural effusion - s/p left thoracentesis with 1 L of blood-tinged tinged fluid removed. Per primary team  NSTEMI - presented with chest pain transferred from urgent care for cardiology plan for cardiac catheterization on 11/22 per charting.  Medical management of chronic RCA occlusion per cardiology note.   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  Iron saturations 11% - note IV iron ordered 11/21 but never given.  Transfuse 1 unit PRBC's now -  see PRBC's are ordered.  Will give one more unit for a total of 2 units on HD today  dispo - He does not yet have permanent access, tunneled catheter, or an outpatient HD unit - continue inpatient monitoring  Claudia Desanctis, MD 10/05/2020 7:10 AM

## 2020-10-05 NOTE — Transfer of Care (Signed)
Immediate Anesthesia Transfer of Care Note  Patient: Michael Trevino  Procedure(s) Performed: LEFT ARM FIRST STAGE BASILIC  ARTERIOVENOUS (AV) FISTULA CREATION (Left ) INSERTION OF RIGHT INTERNAL JUGULAR TUNNELED DIALYSIS CATHETER (Right )  Patient Location: PACU  Anesthesia Type:General  Level of Consciousness: drowsy  Airway & Oxygen Therapy: Patient Spontanous Breathing and Patient connected to nasal cannula oxygen  Post-op Assessment: Report given to RN and Post -op Vital signs reviewed and stable  Post vital signs: Reviewed and stable  Last Vitals:  Vitals Value Taken Time  BP 160/52 10/05/20 1503  Temp    Pulse 84 10/05/20 1506  Resp 24 10/05/20 1506  SpO2 95 % 10/05/20 1506  Vitals shown include unvalidated device data.  Last Pain:  Vitals:   10/05/20 1100  TempSrc: Oral  PainSc: 0-No pain         Complications: No complications documented.

## 2020-10-05 NOTE — Progress Notes (Signed)
Pt refused BiPAP for the night.

## 2020-10-05 NOTE — Progress Notes (Signed)
Nutrition Note  Consult received for renal diet education. Pt unavailable at this time, in OR. Will follow-up tomorrow.    Ronnald Nian, Dietetic Intern Pager: [phone] If unavailable: [phone]

## 2020-10-06 ENCOUNTER — Encounter (HOSPITAL_COMMUNITY): Payer: Self-pay | Admitting: Vascular Surgery

## 2020-10-06 DIAGNOSIS — E1122 Type 2 diabetes mellitus with diabetic chronic kidney disease: Secondary | ICD-10-CM | POA: Diagnosis not present

## 2020-10-06 DIAGNOSIS — J9601 Acute respiratory failure with hypoxia: Secondary | ICD-10-CM | POA: Diagnosis not present

## 2020-10-06 DIAGNOSIS — I2510 Atherosclerotic heart disease of native coronary artery without angina pectoris: Secondary | ICD-10-CM | POA: Diagnosis not present

## 2020-10-06 DIAGNOSIS — I5033 Acute on chronic diastolic (congestive) heart failure: Secondary | ICD-10-CM | POA: Diagnosis not present

## 2020-10-06 DIAGNOSIS — Z550 Illiteracy and low-level literacy: Secondary | ICD-10-CM

## 2020-10-06 LAB — CBC
HCT: 27.2 % — ABNORMAL LOW (ref 39.0–52.0)
Hemoglobin: 8.9 g/dL — ABNORMAL LOW (ref 13.0–17.0)
MCH: 29 pg (ref 26.0–34.0)
MCHC: 32.7 g/dL (ref 30.0–36.0)
MCV: 88.6 fL (ref 80.0–100.0)
Platelets: 143 10*3/uL — ABNORMAL LOW (ref 150–400)
RBC: 3.07 MIL/uL — ABNORMAL LOW (ref 4.22–5.81)
RDW: 13.8 % (ref 11.5–15.5)
WBC: 8.3 10*3/uL (ref 4.0–10.5)
nRBC: 0 % (ref 0.0–0.2)

## 2020-10-06 LAB — TYPE AND SCREEN
ABO/RH(D): AB POS
Antibody Screen: NEGATIVE
Unit division: 0
Unit division: 0

## 2020-10-06 LAB — BPAM RBC
Blood Product Expiration Date: 202112032359
Blood Product Expiration Date: 202112162359
ISSUE DATE / TIME: 202111230921
ISSUE DATE / TIME: 202111230921
Unit Type and Rh: 6200
Unit Type and Rh: 6200

## 2020-10-06 LAB — GLUCOSE, CAPILLARY
Glucose-Capillary: 112 mg/dL — ABNORMAL HIGH (ref 70–99)
Glucose-Capillary: 137 mg/dL — ABNORMAL HIGH (ref 70–99)
Glucose-Capillary: 178 mg/dL — ABNORMAL HIGH (ref 70–99)

## 2020-10-06 MED ORDER — DARBEPOETIN ALFA 40 MCG/0.4ML IJ SOSY
PREFILLED_SYRINGE | INTRAMUSCULAR | Status: AC
  Filled 2020-10-06: qty 0.4

## 2020-10-06 MED ORDER — CHLORHEXIDINE GLUCONATE CLOTH 2 % EX PADS
6.0000 | MEDICATED_PAD | Freq: Every day | CUTANEOUS | Status: DC
  Administered 2020-10-07 – 2020-10-08 (×2): 6 via TOPICAL

## 2020-10-06 MED ORDER — NEPRO/CARBSTEADY PO LIQD
237.0000 mL | Freq: Two times a day (BID) | ORAL | Status: DC
  Administered 2020-10-08 – 2020-10-13 (×7): 237 mL via ORAL

## 2020-10-06 MED ORDER — DARBEPOETIN ALFA 40 MCG/0.4ML IJ SOSY
40.0000 ug | PREFILLED_SYRINGE | INTRAMUSCULAR | Status: AC
  Administered 2020-10-06: 40 ug via INTRAVENOUS

## 2020-10-06 MED ORDER — SODIUM CHLORIDE 0.9 % IV SOLN
125.0000 mg | Freq: Once | INTRAVENOUS | Status: AC
  Administered 2020-10-06: 125 mg via INTRAVENOUS
  Filled 2020-10-06 (×2): qty 10

## 2020-10-06 MED ORDER — RENA-VITE PO TABS
1.0000 | ORAL_TABLET | Freq: Every day | ORAL | Status: DC
  Administered 2020-10-06 – 2020-10-12 (×7): 1 via ORAL
  Filled 2020-10-06 (×7): qty 1

## 2020-10-06 MED ORDER — CHLORPROMAZINE HCL 10 MG PO TABS
10.0000 mg | ORAL_TABLET | Freq: Four times a day (QID) | ORAL | Status: DC | PRN
  Administered 2020-10-06 – 2020-10-13 (×2): 10 mg via ORAL
  Filled 2020-10-06 (×4): qty 1

## 2020-10-06 MED ORDER — HEPARIN SODIUM (PORCINE) 1000 UNIT/ML IJ SOLN
INTRAMUSCULAR | Status: AC
  Administered 2020-10-06: 1000 [IU]
  Filled 2020-10-06: qty 4

## 2020-10-06 NOTE — Progress Notes (Signed)
Patient OOB to bedside commode with 2 person assist

## 2020-10-06 NOTE — Progress Notes (Signed)
[facility] Progress Note  Name: Michael Trevino MRN: [id number] DOB: [birth year]  Subjective:  Had last HD on 11/23 with 3kg UF charted.  Had 100 mL charted UOP over 11/23.   Had AVF and tunneled catheter on 11/23 with vascular as well.  Got two units of blood on 11/23.  He has been weaned to 3 liters of oxygen.  "Feeling perfect"  Review of systems:    Denies shortness of breath  Denies chest pain  Denies n/v/d   Intake/Output Summary (Last 24 hours) at 10/06/2020 0618 Last data filed at 10/05/2020 2120 Gross per 24 hour  Intake 1433 ml  Output 3110 ml  Net -1677 ml    Vitals:  Vitals:   10/05/20 2116 10/05/20 2200 10/05/20 2359 10/06/20 0411  BP: (!) 140/56 (!) 135/51 (!) 122/46 (!) 130/51  Pulse: (!) 105 93 80 83  Resp:  20 20 17   Temp:   98.4 F (36.9 C) 98.6 F (37 C)  TempSrc:   Oral Oral  SpO2:  100% 100% 100%  Weight:    100 kg  Height:         Physical Exam:   General adult male in bed in no acute distress HEENT normocephalic atraumatic extraocular movements intact sclera anicteric Neck supple trachea midline Lungs clear to auscultation bilaterally normal work of breathing at rest  Heart S1S2 no rub appreciated Abdomen soft nontender obese habitus  Extremities trace edema lower extremities Psych normal mood and affect Neuro alert and oriented provides hx and follows commands Access RIJ tunneled catheter and LUE AVF with bruit and thrill    Medications reviewed   Labs:  BMP Latest Ref Rng & Units 10/06/2020 10/05/2020 10/05/2020  Glucose 70 - 99 mg/dL 192(H) 94 85  BUN 8 - 23 mg/dL 36(H) 21 46(H)  Creatinine 0.61 - 1.24 mg/dL 5.13(H) 3.40(H) 6.10(H)  BUN/Creat Ratio 10 - 24 - - -  Sodium 135 - 145 mmol/L 134(L) 137 135  Potassium 3.5 - 5.1 mmol/L 4.2 3.3(L) 3.9  Chloride 98 - 111 mmol/L 98 99 96(L)  CO2 22 - 32 mmol/L 24 - 25  Calcium 8.9 - 10.3 mg/dL 7.3(L) - 7.3(L)     Assessment/Plan:    New start ESRD sec to diabetic  nephropathy.  s/p RIJ tunn catheter and LUE AVF on 11/23 with vascular  HD today, 11/24 to align with holiday schedule for the TTS gout.  Then next HD is on 11/27 (here vs. Outpatient depending on dispo)  He has an outpatient unit - [facility] but cannot start there until 11/27   Acute hypoxic respiratory failure with large left pleural effusion   Left Pleural effusion - s/p left thoracentesis with 1 L of blood-tinged tinged fluid removed. Per primary team  NSTEMI - presented with chest pain transferred from urgent care for cardiology plan for cardiac catheterization on 11/22 per charting.  Medical management of chronic RCA occlusion per cardiology note.   Hypertension - UF as tolerated with HD.  Improved.   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  Iron saturations 11%. Iron ordered for today.  2 units PRBC's on 11/24  dispo - please continue inpatient monitoring  Patient been accepted to an outpatient unit for starting there as early as 11/27 when he is medically cleared.  [facility] TTS with 10:45 chair  time.    When he is discharged, nephrology will need to call Michael Trevino to notify them and to fax them the discharge summary.  Michael Trevino phone:  [phone] and Fax: [phone]  Michael Desanctis, MD 10/06/2020 6:32 AM

## 2020-10-06 NOTE — Progress Notes (Signed)
   [facility] will sign off.   Medication Recommendations:  Meds as on MAR Other recommendations (labs, testing, etc):  NA Follow up as an outpatient:  We will arrange two month follow up in our office.

## 2020-10-06 NOTE — Progress Notes (Signed)
Initial Nutrition Assessment  DOCUMENTATION CODES:   Obesity unspecified  INTERVENTION:   Discontinue MVI with minerals, add renal MVI.   Renal diet education provided. "Food Pyramid for Healthy Eating with Kidney Disease" handout and "Low-Sodium, Low-Potassium, and Low-Phosphorus" handouts from the [facility] were provided to patient.   Nepro Shake po BID, each supplement provides 425 kcal and 19 grams protein   NUTRITION DIAGNOSIS:   Inadequate oral intake related to decreased appetite as evidenced by meal completion < 50%.   GOAL:   Patient will meet greater than or equal to 90% of their needs   MONITOR:   Weight trends, PO intake, Supplement acceptance, Labs, I & O's  REASON FOR ASSESSMENT:   Consult Assessment of nutrition requirement/status, Diet education  ASSESSMENT:    Pt with PMH of HTN, T2DM, CKD 5 with nephrotic syndrome, hypothyroidism, CAD with prior MI. Admitted for CHF exacerbation and CKD 4 with volume overload.   11/23- left AV fistula placed  Pt alert and oriented. Daughter at beside during RD visit. Pt reports good appetite PTA. Pt reports wife does all the cooking at home. Pt also receives one meal daily M-F from [employer] Whe[employer] service. Typical intake: Breakfast: eggs or oatmeal, Lunch: meat, starch, vegetable, Dinner: hamburger from fast food place. Pt denies using a salt shaker for additional salt to meals. Pt reports decreased appetite while admitted. 40% of meals completed x last 4 recorded meal reports.   Per daughter report, she has noticed a small weight loss in the past few months. Pt reports this was intentional by lifestyle changes. Per chart review, pt is down an average 8-10 kg since February but unable to determine definite weight loss d/t possible fluid accumulation. Per RD physical exam, no major depletions found.   Labs reviewed: CBG's x 24 hours: 92-244, Phosphorus: 5.3, Sodium: 134,   Medications reviewed and  include: Vitamin C, calcitriol, Vitamin D3, SSI, Levemir, Aranesp   NUTRITION - FOCUSED PHYSICAL EXAM:    Most Recent Value  Orbital Region No depletion  Upper Arm Region No depletion  Thoracic and Lumbar Region No depletion  Buccal Region No depletion  Temple Region No depletion  Clavicle Bone Region Mild depletion  Clavicle and Acromion Bone Region Mild depletion  Scapular Bone Region No depletion  Dorsal Hand No depletion  Patellar Region No depletion  Anterior Thigh Region No depletion  Posterior Calf Region No depletion  Edema (RD Assessment) Mild  Hair Reviewed  Eyes Reviewed  Mouth Reviewed  Skin Reviewed  Nails Reviewed      Nutrition Education Note:   RD consulted for Renal Education. Reviewed food groups and provided written recommended serving sizes specifically determined for patient's current nutritional status. Explained why diet restrictions are needed and provided lists of foods to limit/avoid that are high potassium, sodium, and phosphorus. Provided specific recommendations on safer alternatives of these foods. Discussed renal-friendly beverages options and the need for fluid restriction with dialysis. Discussed importance of protein intake at each meal and snack. Provided examples of how to maximize protein intake throughout the day.   Strongly encouraged compliance of this diet. Pt and daughter expressed understanding.  Encouraged pt to discuss specific diet questions/concerns with RD at HD outpatient facility. Teach back method used.  Expect good compliance.  RD contact information provided. If additional nutrition issues arise, please re-consult RD.   Diet Order:   Diet Order            Diet heart healthy/carb modified  Room service appropriate? Yes; Fluid consistency: Thin  Diet effective now                 EDUCATION NEEDS:   Education needs have been addressed  Skin:  Skin Assessment: Reviewed RN Assessment  Last BM:  11/20  Height:    Ht Readings from Last 1 Encounters:  10/01/20 5\' 9"  (1.753 m)    Weight:   Wt Readings from Last 1 Encounters:  10/06/20 100 kg    Ideal Body Weight:  73 kg  BMI:  Body mass index is 32.56 kg/m.  Estimated Nutritional Needs:   Kcal:  3532-9924  Protein:  120-146 grams  Fluid:  1061ml + UOP    Ronnald Nian, Dietetic Intern Pager: [phone] If unavailable: [phone]

## 2020-10-06 NOTE — Evaluation (Signed)
Occupational Therapy Evaluation Patient Details Name: Michael Trevino MRN: [id number] DOB: Mar 30, 1941 Today's Date: 10/06/2020    History of Present Illness  Michael Trevino  is a 79 y.o. male with PMH relevant for HTN, DM2, CKD 5 with nephrotic syndrome,hypothyroidism, CAD with prior MI, and HLD and obesity who initially presented on 09/30/2020 to the surgical unit for creation of a left arm AV fistula, however patient complained of some chest discomfort and shortness of breath, so his procedure was canceled, and patient was sent to the ED for work-up for his shortness of breath. Admitted with HFpEF. S/p cardiac cath and placement of RIJ tunneled dialysis catheter.   Clinical Impression   Pt reports walking with a cane and performing ADL independently (sponge bathes). He is dependent in meal prep. No family available to confirm PLOF and pt demonstrates impaired cognition. Pt presents with generalized weakness and poor sitting and standing balance. He is reliant on +2 assist for OOB and set up to total assist for ADL. Will follow acutely. Recommending post acute therapy in SNF.    Follow Up Recommendations  SNF;Supervision/Assistance - 24 hour    Equipment Recommendations  Other (comment) (defer to next venue)    Recommendations for Other Services       Precautions / Restrictions Precautions Precautions: Fall Restrictions Weight Bearing Restrictions: No      Mobility Bed Mobility Overal bed mobility: Needs Assistance Bed Mobility:Sit to Supine     Sit to supine: Mod assist       Transfers Overall transfer level: Needs assistance Equipment used: Rolling walker (2 wheeled);None Transfers: Sit to/from [facility] Sit to Stand: Mod assist;+2 physical assistance Stand pivot transfers: Mod assist;+2 physical assistance      General transfer comment: pt on BSC upon arrival with PT, assisted to stand, for pericare and to pivot back to bed    Balance Overall  balance assessment: Needs assistance Sitting-balance support: Feet supported;No upper extremity supported Sitting balance-Leahy Scale: Poor Sitting balance - Comments: Min guard assist when hands unsupported washing sink. Increased cervical/trunk flexion   Standing balance support: Bilateral upper extremity supported Standing balance-Leahy Scale: Poor Standing balance comment: reliant on external support                           ADL either performed or assessed with clinical judgement   ADL Overall ADL's : Needs assistance/impaired Eating/Feeding: Set up;Sitting   Grooming: Wash/dry hands;Wash/dry face;Sitting;Min guard   Upper Body Bathing: Moderate assistance;Sitting   Lower Body Bathing: Total assistance;+2 for physical assistance;Sit to/from stand   Upper Body Dressing : Minimal assistance;Sitting   Lower Body Dressing: Total assistance;+2 for physical assistance;Sit to/from stand   Toilet Transfer: +2 for physical assistance;Stand-pivot;BSC;RW;Moderate assistance   Toileting- Clothing Manipulation and Hygiene: Total assistance;+2 for physical assistance;Sit to/from stand               Vision Baseline Vision/History: Wears glasses Patient Visual Report: No change from baseline       Perception     Praxis      Pertinent Vitals/Pain Pain Assessment: Faces Faces Pain Scale: Hurts a little bit Pain Location: RUE (catheter site) Pain Descriptors / Indicators: Guarding Pain Intervention(s): Monitored during session;Repositioned     Hand Dominance Right   Extremity/Trunk Assessment Upper Extremity Assessment Upper Extremity Assessment: Generalized weakness   Lower Extremity Assessment Lower Extremity Assessment: Defer to PT evaluation   Cervical / Trunk Assessment Cervical / Trunk Assessment: Kyphotic;Other  exceptions (weakness)   Communication Communication Communication: No difficulties   Cognition Arousal/Alertness: Awake/alert Behavior  During Therapy: WFL for tasks assessed/performed Overall Cognitive Status: Impaired/Different from baseline Area of Impairment: Orientation;Safety/judgement;Awareness;Problem solving                 Orientation Level: Disoriented to;Time;Place       Safety/Judgement: Decreased awareness of safety;Decreased awareness of deficits Awareness: Intellectual Problem Solving: Difficulty sequencing;Requires verbal cues;Decreased initiation General Comments: Pt not oriented to time/place, stating it was 2002 and he was at [facility]. Pt requires step by step cues for sequencing and initiation of motor tasks. Decreased awareness of safety/deficit   General Comments       Exercises     Shoulder Instructions      Home Living Family/patient expects to be discharged to:: Private residence Living Arrangements: Spouse/significant other Available Help at Discharge: Family;Available 24 hours/day Type of Home: Mobile home Home Access: Level entry     Home Layout: One level     Bathroom Shower/Tub: Teacher, early years/pre: Standard     Home Equipment: Cane - single point;Walker - 2 wheels          Prior Functioning/Environment Level of Independence: Needs assistance  Gait / Transfers Assistance Needed: uses cane intermittently ADL's / Homemaking Assistance Needed: states independent ADL's, requires assist for IADL's i.e. cooking, still drives   Comments: no family available to confirm PLOF        OT Problem List: Decreased strength;Impaired balance (sitting and/or standing);Decreased cognition;Decreased safety awareness;Decreased knowledge of use of DME or AE      OT Treatment/Interventions: Self-care/ADL training;DME and/or AE instruction;Therapeutic activities;Patient/family education;Balance training    OT Goals(Current goals can be found in the care plan section) Acute Rehab OT Goals Patient Stated Goal: go home OT Goal Formulation: With patient Time For  Goal Achievement: 10/20/20 Potential to Achieve Goals: Good ADL Goals Pt Will Perform Grooming: with supervision;sitting (at sink) Pt Will Perform Upper Body Dressing: with supervision;sitting Pt Will Transfer to Toilet: ambulating;bedside commode;with mod assist Pt Will Perform Toileting - Clothing Manipulation and hygiene: with mod assist;sit to/from stand Pt/caregiver will Perform Home Exercise Program: Increased strength;Both right and left upper extremity;With minimal assist (AROM) Additional ADL Goal #1: Pt will perform bed mobility with supervision in preparation for ADL. Additional ADL Goal #2: Pt will participate in ADL in unsupported sitting with supervision for safety.  OT Frequency: Min 2X/week   Barriers to D/C:            Co-evaluation PT/OT/SLP Co-Evaluation/Treatment: Yes Reason for Co-Treatment: Complexity of the patient's impairments (multi-system involvement);For patient/therapist safety PT goals addressed during session: Mobility/safety with mobility OT goals addressed during session: ADL's and self-care;Proper use of Adaptive equipment and DME      AM-PAC OT "6 Clicks" Daily Activity     Outcome Measure Help from another person eating meals?: A Little Help from another person taking care of personal grooming?: A Little Help from another person toileting, which includes using toliet, bedpan, or urinal?: Total Help from another person bathing (including washing, rinsing, drying)?: A Lot Help from another person to put on and taking off regular upper body clothing?: A Lot Help from another person to put on and taking off regular lower body clothing?: Total 6 Click Score: 12   End of Session Equipment Utilized During Treatment: Gait belt;Rolling walker  Activity Tolerance: Patient tolerated treatment well Patient left: in bed;with call bell/phone within reach  OT Visit Diagnosis: Unsteadiness  on feet (R26.81);Other abnormalities of gait and mobility  (R26.89);Muscle weakness (generalized) (M62.81);Other symptoms and signs involving cognitive function                Time: 9147-8295 OT Time Calculation (min): 15 min Charges:  OT General Charges $OT Visit: 1 Visit OT Evaluation $OT Eval Moderate Complexity: 1 Mod  Michael Trevino, Michael Trevino Acute Rehabilitation Services Pager: [phone] Office: [phone]  Malka So 10/06/2020, 9:45 AM

## 2020-10-06 NOTE — Care Management Important Message (Signed)
Important Message  Patient Details  Name: Michael Trevino MRN: [id number] Date of Birth: 1940/11/20   Medicare Important Message Given:  Yes  Due to staffing patient room was called no answer IM documents mailed to the patient home address.    Annsleigh Dragoo 10/06/2020, 1:45 PM

## 2020-10-06 NOTE — Progress Notes (Signed)
PROGRESS NOTE  Idrissa Beville WEX:[id number] DOB: Feb 04, 1941 DOA: 09/30/2020 PCP: Dettinger, Fransisca Kaufmann, MD  Brief History   [name] a79 y.o.malewith past medical history relevant for HTN, DM2, CKD 5 with nephrotic syndrome,, hypothyroidism, CAD with prior MI, and HLD and obesity who initially presented on 09/30/2020 to the surgical unit for creation of a left arm AV fistula by vascular surgeon Dr. Cherylynn Ridges patient complained of some chest discomfort and shortness of breath, so hisprocedure was canceled,and patient was sent to the ED for work-up for his shortness of breath.  Upon presentation the patient was very short of breath and hypoxic required continuous BiPAP initially,also requiring IV nitro drip for elevated BP and CHF flare-up.  received IV Lasix 80 mg x 2 with very limited output. Chest x-ray in the ED showed bilateral pleural effusion left more than right. He underwent successful ultrasound-guided thoracentesis on 09/30/2020 with removal of 1 L from the left pleural space. The results are consistent with a transudative effusion. After the thoracentesis patient was eventually able to come off BiPAP,and transitioned to nasal cannula. Echo performed on 93/81/0175 with diastolic dysfunction, The patient denied fever, chills, productive cough, nausea, vomiting, or diarrhea. No pleuritic chest pain. He does have lower extremity edema. Creatinine was elevated at 5.5 with acidosis and hyperuricemia. His hemoglobin is close to baseline.  LFTs are not elevated. BNP is elevated at 1022 and chest x-ray consistent with CHF with effusions. Troponin is 194 in the setting of renal failure.  Echocardiogram from 09/30/2020 has demonstrated EF of 55-60% with severe LVH, and a small pericardial effusion without significant valvular abnormalities and no significant wall motion abnormalities. No sign of tamponade. Moderate AV regurgitation. Moderate aortic stenosis.   He was started on a  heparin drip by cardiology. They performed LHC on 10/04/2020 after HD was initiated. It demonstrated an occluded RCA with grade 3 left to right collaterals. There was also a 50% mid LAD lesion. LVEDP was only mildly elevated at 19. They feel that his elevated troponins on admission were due to demand ischemia due to volume overload and collateral insufficiency. They have recommended medical therapy.   The patient has had placement of a left arm first stage basilic AV fistula created and the insertion of a right internal jugular tunneled dialysis catheter.   Dialysis navigator has set the patient up for outpatient dialysis on a TTS schedule.   The patient has been evaluated by PT/OT and they have recommended SNF. TOC has been consulted.  Consultants  . Nephrology . Cardiology . Vascular surgery . Interventional Radiology  Procedures  . Thoracentesis . Placement of AV fistula . Placement of tunneled temporary dialysis catheter . Initiation of HD . Left Heart catheterization.  Antibiotics   Anti-infectives (From admission, onward)   Start     Dose/Rate Route Frequency Ordered Stop   10/05/20 1307  ceFAZolin (ANCEF) 2-4 GM/100ML-% IVPB       Note to Pharmacy: [name]   : cabinet override      10/05/20 1307 10/06/20 0114    .  Interval History/Subjective  The patient is resting comfortably. No new complaints.  Objective   Vitals:  Vitals:   10/06/20 1530 10/06/20 1600  BP: (!) 137/49 (!) 155/64  Pulse: 75 77  Resp: 16 16  Temp:    SpO2:     Exam:  Constitutional:  . The patient is awake, alert, and oriented x 3. No acute distress. Respiratory:  . No increased work of breathing. Marland Kitchen No  wheezes, rales, or rhonchi . No tactile fremitus Cardiovascular:  . Regular rate and rhythm . No murmurs, ectopy, or gallups. . No lateral PMI. No thrills. Abdomen:  . Abdomen is soft, non-tender, non-distended . No hernias, masses, or organomegaly . Normoactive bowel sounds.   Musculoskeletal:  . No cyanosis, clubbing, or edema Skin:  . No rashes, lesions, ulcers . palpation of skin: no induration or nodules Neurologic:  . CN 2-12 intact . Sensation all 4 extremities intact Psychiatric:  . Mental status o Mood, affect appropriate o Orientation to person, place, time  . judgment and insight appear intact   I have personally reviewed the following:   Today's Data  . Vitals, Pleural fluid studies, CMP  Micro Data  . 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 CXR  Cardiology Data  . Echocardiogram . Left Heart Catheterization 10/04/2020  Scheduled Meds: . sodium chloride   Intravenous Once  . sodium chloride   Intravenous Once  . amLODipine  10 mg Oral Daily  . ascorbic acid  500 mg Oral Daily  . aspirin  81 mg Oral Daily  . atorvastatin  80 mg Oral Daily  . calcitRIOL  0.5 mcg Oral Daily  . calcium carbonate  1 tablet Oral BID WC  . Chlorhexidine Gluconate Cloth  6 each Topical Daily  . Chlorhexidine Gluconate Cloth  6 each Topical Q0600  . Chlorhexidine Gluconate Cloth  6 each Topical Q0600  . cholecalciferol  1,000 Units Oral Daily  . Darbepoetin Alfa      . feeding supplement (NEPRO CARB STEADY)  237 mL Oral BID BM  . heparin  5,000 Units Subcutaneous Q8H  . hydrALAZINE  50 mg Oral TID  . insulin aspart  0-5 Units Subcutaneous QHS  . insulin aspart  0-9 Units Subcutaneous TID WC  . insulin detemir  25 Units Subcutaneous QHS  . levothyroxine  175 mcg Oral QAC breakfast  . metoprolol tartrate  100 mg Oral BID  . multivitamin  1 tablet Oral QHS  . sodium chloride flush  3 mL Intravenous Q12H  . sodium chloride flush  3 mL Intravenous Q12H  . sodium chloride flush  3 mL Intravenous Q12H   Continuous Infusions: . sodium chloride    . sodium chloride    . ferric gluconate (FERRLECIT/NULECIT) IV 125 mg (10/06/20 1541)    Principal Problem:   Acute exacerbation of CHF (congestive heart failure) (HCC) Active Problems:    Hypothyroidism   Illiterate   CAD (coronary artery disease)   Non-ST elevation (NSTEMI) myocardial infarction [facility])   Essential hypertension, benign   CKD stage 5 due to type 2 diabetes mellitus (HCC)   Pleural effusion--Lt > Rt   Acute respiratory failure with hypoxia (HCC)   LOS: 6 days   A & P  HPpEF: Acute on chronic CHF exacerbation. Echocardiogram from 09/30/2020 has demonstrated EF of 55-60% with severe LVH, and a small pericardial effusion without significant valvular abnormalities and no significant wall motion abnormalities. No sign of tamponade. Moderate AV regurgitation. Moderate aortic stenosis.   CAD: LHC performed on 10/04/2020.  It demonstrated an occluded RCA with grade 3 left to right collaterals. There was also a 50% mid LAD lesion. LVEDP was only mildly elevated at 19. They feel that his elevated troponins on admission were due to demand ischemia due to volume overload and collateral insufficiency. They have recommended medical therapy.   Pleural effusion Lt greater than Rt: 1L cloudy amber fluid was removed from the  patient's left pleural space. It appears to be transudative in etiology. The studies were not able to perform microbiological studies on the fluid.   Acute hypoxic respiratory failure: Due to pulmonary edema and left pleural effusion. This afternoon the patient is saturating 90% on room air. The patient required 10L by HFNC on admission.  NSTEMI:  Cardiology feels that his elevated troponins on admission were due to demand ischemia due to volume overload and collateral insufficiency.  CKD V. Now on HD: The patient has had placement of a left arm first stage basilic AV fistula created and the insertion of a right internal jugular tunneled dialysis catheter.  Dialysis Navigator has set the patient up for outpatient dialysis on a TTS schedule.  Hypothyroidism: Continue Synthroid as at home. Recheck TSH, FT4 and FT3.  PTH I: elevated due to renal  insufficiency.  Essential Hypertension: Patient is normotensive today on amlodipine, hydralazine, and metoprolol.  Debility: PT/OT have recommended SNF placement as this patient is too weak even to move from bed to chair without 2+ assistance.  I have seen and examined this patient myself. I have spent 38 minutes in his evaluation and care.  DVT Prophylaxis: Heparin SQ CODE STATUS: Full Code Family Communication: I have discussed the patient with his spouse who is at bedside Disposition: Status is: Inpatient  Remains inpatient appropriate because:Inpatient level of care appropriate due to severity of illness   Dispo: The patient is from: Home              Anticipated d/c is to: SNF              Anticipated d/c date is: 1 day              Patient currently is not medically stable to d/c.       Aurther Harlin, DO [facility] Direct contact: see [email or web address]  7PM-7AM contact night coverage as above 10/06/2020, 4:11 PM  LOS: 6 days

## 2020-10-06 NOTE — Procedures (Signed)
Seen and examined on dialysis.  Blood pressure 137/49 and HR 73. BF 400.  Tolerating goal.  Breathing is much better than it was.  Claudia Desanctis, MD 10/06/2020   4:03 PM

## 2020-10-06 NOTE — Evaluation (Signed)
Physical Therapy Evaluation Patient Details Name: Michael Trevino MRN: [id number] DOB: 03-09-1941 Today's Date: 10/06/2020   History of Present Illness   Michael Trevino  is a 79 y.o. male with PMH relevant for HTN, DM2, CKD 5 with nephrotic syndrome,hypothyroidism, CAD with prior MI, and HLD and obesity who initially presented on 09/30/2020 to the surgical unit for creation of a left arm AV fistula, however patient complained of some chest discomfort and shortness of breath, so his procedure was canceled, and patient was sent to the ED for work-up for his shortness of breath. Admitted with HFpEF. S/p cardiac cath and placement of RIJ tunneled dialysis catheter.  Clinical Impression  Prior to admission, pt lives with his spouse and states he uses a cane for mobility and is independent with ADL's. Pt presents with decreased functional mobility secondary to generalized weakness, abnormal posture, poor sitting/standing balance, cognitive impairments and decreased endurance. Pt is not oriented to place/time. Requiring maximal assist for low pivot transfer onto bedside commode. OT present during latter part of session and pt assisted back to bed with two person moderate assist and a walker. SpO2 100% on 3L O2. Due to deficits listed above, recommending SNF at discharge to maximize functional mobility and decrease caregiver burden.    Follow Up Recommendations SNF    Equipment Recommendations  3in1 (PT);Wheelchair (measurements PT);Wheelchair cushion (measurements PT)    Recommendations for Other Services       Precautions / Restrictions Precautions Precautions: Fall Restrictions Weight Bearing Restrictions: No      Mobility  Bed Mobility Overal bed mobility: Needs Assistance Bed Mobility: Supine to Sit;Sit to Supine     Supine to sit: Min guard Sit to supine: Mod assist   General bed mobility comments: Pt able to progress to edge of bed with max cues for sequencing and use of bed rail with  min guard assist. Significant time/effort. ModA for LE assist back into bed    Transfers Overall transfer level: Needs assistance Equipment used: Rolling walker (2 wheeled);None Transfers: Sit to/from [employer] Transfers Sit to Stand: Mod assist;+2 physical assistance Stand pivot transfers: Mod assist;+2 physical assistance Squat pivot transfers: Max assist     General transfer comment: MaxA for squat pivot transfer from bed to [facility], pt reaching for PT pant's leg in process despite max cues for hand placement, requiring assist for hip guidance. ModA + 2 to stand from [facility] and pivot back over to bed. Cues for sequencing/direction, stepping initiation  Ambulation/Gait                Stairs            Wheelchair Mobility    Modified Rankin (Stroke Patients Only)       Balance Overall balance assessment: Needs assistance Sitting-balance support: Feet supported;No upper extremity supported Sitting balance-Leahy Scale: Poor Sitting balance - Comments: Min guard assist when hands unsupported washing sink. Increased cervical/trunk flexion   Standing balance support: Bilateral upper extremity supported Standing balance-Leahy Scale: Poor Standing balance comment: reliant on external support                             Pertinent Vitals/Pain Pain Assessment: Faces Faces Pain Scale: Hurts a little bit Pain Location: RUE (catheter site) Pain Descriptors / Indicators: Guarding Pain Intervention(s): Monitored during session    Home Living Family/patient expects to be discharged to:: Private residence Living Arrangements: Spouse/significant other Available Help at Discharge: Family;Available  24 hours/day Type of Home: Mobile home Home Access: Level entry     Home Layout: One level Home Equipment: Cane - single point;Walker - 2 wheels      Prior Function Level of Independence: Needs assistance   Gait / Transfers Assistance  Needed: uses cane intermittently  ADL's / Homemaking Assistance Needed: states independent ADL's, requires assist for IADL's i.e. cooking, still drives        Hand Dominance        Extremity/Trunk Assessment   Upper Extremity Assessment Upper Extremity Assessment: Defer to OT evaluation    Lower Extremity Assessment Lower Extremity Assessment: Generalized weakness    Cervical / Trunk Assessment Cervical / Trunk Assessment: Kyphotic;Other exceptions (poor truncal control )  Communication   Communication: No difficulties  Cognition Arousal/Alertness: Awake/alert Behavior During Therapy: WFL for tasks assessed/performed Overall Cognitive Status: Impaired/Different from baseline Area of Impairment: Orientation;Safety/judgement;Awareness;Problem solving                 Orientation Level: Disoriented to;Time;Place       Safety/Judgement: Decreased awareness of safety;Decreased awareness of deficits Awareness: Intellectual Problem Solving: Difficulty sequencing;Requires verbal cues;Decreased initiation General Comments: Pt not oriented to time/place, stating it was 2002 and he was at [facility] Pt requires step by step cues for sequencing and initiation of motor tasks. Decreased awareness of safety/deficit      General Comments      Exercises     Assessment/Plan    PT Assessment Patient needs continued PT services  PT Problem List Decreased strength;Decreased activity tolerance;Decreased balance;Decreased mobility;Decreased cognition;Decreased safety awareness       PT Treatment Interventions DME instruction;Gait training;Functional mobility training;Therapeutic activities;Therapeutic exercise;Balance training;Patient/family education;Wheelchair mobility training    PT Goals (Current goals can be found in the Care Plan section)  Acute Rehab PT Goals Patient Stated Goal: go home PT Goal Formulation: With patient Time For Goal Achievement: 10/20/20 Potential  to Achieve Goals: Fair    Frequency Min 3X/week   Barriers to discharge        Co-evaluation PT/OT/SLP Co-Evaluation/Treatment: Yes Reason for Co-Treatment: Complexity of the patient's impairments (multi-system involvement);For patient/therapist safety;To address functional/ADL transfers PT goals addressed during session: Mobility/safety with mobility         AM-PAC PT "6 Clicks" Mobility  Outcome Measure Help needed turning from your back to your side while in a flat bed without using bedrails?: A Little Help needed moving from lying on your back to sitting on the side of a flat bed without using bedrails?: A Little Help needed moving to and from a bed to a chair (including a wheelchair)?: Total Help needed standing up from a chair using your arms (e.g., wheelchair or bedside chair)?: A Lot Help needed to walk in hospital room?: Total Help needed climbing 3-5 steps with a railing? : Total 6 Click Score: 11    End of Session Equipment Utilized During Treatment: Gait belt Activity Tolerance: Patient tolerated treatment well Patient left: in bed;with call bell/phone within reach;with bed alarm set;Other (comment) (pending transport to dialysis) Nurse Communication: Mobility status PT Visit Diagnosis: Unsteadiness on feet (R26.81);Muscle weakness (generalized) (M62.81);Difficulty in walking, not elsewhere classified (R26.2)    Time: 8657-8469 PT Time Calculation (min) (ACUTE ONLY): 32 min   Charges:   PT Evaluation $PT Eval Moderate Complexity: 1 Mod          Wyona Almas, PT, DPT Acute Rehabilitation Services Pager [phone] Office [phone]   Deno Etienne 10/06/2020, 9:26 AM

## 2020-10-06 NOTE — Plan of Care (Addendum)
Round on patient today in correlation to new start HD. Patient found to be eating lunch and cheerful. Patient was able to speak to why dialysis is necessary and dietary restrictions previously discussed with the dietician. Educated patient on site care of AVF with assesment of thrill and and bruit. Also stressed the importance of infection prevention of his tunneled HD catheter and that its removal will come after his fistula is ready for use. Patient capable of verbalizing understanding. Patient's transport service here for transportation to HD. Assisted patient's RN with repositioning. Plan to follow patient as appropriate.   Problem: Education: Goal: Knowledge of disease and its progression will improve Outcome: Progressing   Problem: Nutritional: Goal: Ability to make healthy dietary choices will improve Outcome: Progressing  Dorthey Sawyer, RN  Dialysis Nurse Coordinator [phone]

## 2020-10-06 NOTE — Progress Notes (Signed)
   VASCULAR SURGERY ASSESSMENT & PLAN:   POD 1 TDC/ L AVF: His fistula has a good thrill and has a palpable radial pulse.  We have already arranged follow-up in our office in 6 weeks to check on the maturation of the fistula.  He would then need a second stage basilic vein transposition.  He will use his catheter for dialysis for the time being.  Vascular surgery will be available as needed.  SUBJECTIVE:   No complaints this morning.  PHYSICAL EXAM:   Vitals:   10/05/20 2116 10/05/20 2200 10/05/20 2359 10/06/20 0411  BP: (!) 140/56 (!) 135/51 (!) 122/46 (!) 130/51  Pulse: (!) 105 93 80 83  Resp:  20 20 17   Temp:   98.4 F (36.9 C) 98.6 F (37 C)  TempSrc:   Oral Oral  SpO2:  100% 100% 100%  Weight:    100 kg  Height:       Good thrill in left AV fistula His catheter site looks fine.  LABS:   Lab Results  Component Value Date   WBC 8.3 10/06/2020   HGB 8.9 (L) 10/06/2020   HCT 27.2 (L) 10/06/2020   MCV 88.6 10/06/2020   PLT 143 (L) 10/06/2020    CBG (last 3)  Recent Labs    10/05/20 1503 10/05/20 2116 10/06/20 0618  GLUCAP 100* 244* 112*    PROBLEM LIST:    Principal Problem:   Acute exacerbation of CHF (congestive heart failure) (HCC) Active Problems:   Hypothyroidism   Illiterate   CAD (coronary artery disease)   Non-ST elevation (NSTEMI) myocardial infarction [facility])   Essential hypertension, benign   CKD stage 5 due to type 2 diabetes mellitus (HCC)   Pleural effusion--Lt > Rt   Acute respiratory failure with hypoxia (HCC)   CURRENT MEDS:   . sodium chloride   Intravenous Once  . sodium chloride   Intravenous Once  . amLODipine  10 mg Oral Daily  . ascorbic acid  500 mg Oral Daily  . aspirin  81 mg Oral Daily  . atorvastatin  80 mg Oral Daily  . calcitRIOL  0.5 mcg Oral Daily  . calcium carbonate  1 tablet Oral BID WC  . Chlorhexidine Gluconate Cloth  6 each Topical Daily  . Chlorhexidine Gluconate Cloth  6 each Topical Q0600  .  cholecalciferol  1,000 Units Oral Daily  . darbepoetin (ARANESP) injection - DIALYSIS  40 mcg Intravenous Once  . furosemide  60 mg Intravenous Once  . heparin  5,000 Units Subcutaneous Q8H  . hydrALAZINE  50 mg Oral TID  . insulin aspart  0-5 Units Subcutaneous QHS  . insulin aspart  0-9 Units Subcutaneous TID WC  . insulin detemir  25 Units Subcutaneous QHS  . levothyroxine  175 mcg Oral QAC breakfast  . metoprolol tartrate  100 mg Oral BID  . multivitamin with minerals  1 tablet Oral Daily  . mupirocin ointment  1 application Nasal BID  . sodium chloride flush  3 mL Intravenous Q12H  . sodium chloride flush  3 mL Intravenous Q12H  . sodium chloride flush  3 mL Intravenous Q12H    Deitra Mayo Office: [phone] 10/06/2020

## 2020-10-06 NOTE — Plan of Care (Signed)
  Problem: Education: Goal: Knowledge of General Education information will improve Description Including pain rating scale, medication(s)/side effects and non-pharmacologic comfort measures Outcome: Progressing   

## 2020-10-07 DIAGNOSIS — I10 Essential (primary) hypertension: Secondary | ICD-10-CM | POA: Diagnosis not present

## 2020-10-07 DIAGNOSIS — J9601 Acute respiratory failure with hypoxia: Secondary | ICD-10-CM | POA: Diagnosis not present

## 2020-10-07 DIAGNOSIS — I5033 Acute on chronic diastolic (congestive) heart failure: Secondary | ICD-10-CM | POA: Diagnosis not present

## 2020-10-07 DIAGNOSIS — I2510 Atherosclerotic heart disease of native coronary artery without angina pectoris: Secondary | ICD-10-CM | POA: Diagnosis not present

## 2020-10-07 LAB — RENAL FUNCTION PANEL
Albumin: 2.2 g/dL — ABNORMAL LOW (ref 3.5–5.0)
Albumin: 2.1 g/dL — ABNORMAL LOW (ref 3.5–5.0)
Anion gap: 10 (ref 5–15)
Anion gap: 12 (ref 5–15)
BUN: 28 mg/dL — ABNORMAL HIGH (ref 8–23)
BUN: 36 mg/dL — ABNORMAL HIGH (ref 8–23)
CO2: 27 mmol/L (ref 22–32)
CO2: 24 mmol/L (ref 22–32)
Calcium: 7.7 mg/dL — ABNORMAL LOW (ref 8.9–10.3)
Calcium: 7.3 mg/dL — ABNORMAL LOW (ref 8.9–10.3)
Chloride: 100 mmol/L (ref 98–111)
Chloride: 98 mmol/L (ref 98–111)
Creatinine, Ser: 4.47 mg/dL — ABNORMAL HIGH (ref 0.61–1.24)
Creatinine, Ser: 5.13 mg/dL — ABNORMAL HIGH (ref 0.61–1.24)
GFR, Estimated: 13 mL/min — ABNORMAL LOW (ref >60)
GFR, Estimated: 11 mL/min — ABNORMAL LOW (ref >60)
Glucose, Bld: 98 mg/dL (ref 70–99)
Glucose, Bld: 192 mg/dL — ABNORMAL HIGH (ref 70–99)
Phosphorus: 3 mg/dL (ref 2.5–4.6)
Phosphorus: 5.3 mg/dL — ABNORMAL HIGH (ref 2.5–4.6)
Potassium: 3.8 mmol/L (ref 3.5–5.1)
Potassium: 4.2 mmol/L (ref 3.5–5.1)
Sodium: 137 mmol/L (ref 135–145)
Sodium: 134 mmol/L — ABNORMAL LOW (ref 135–145)

## 2020-10-07 LAB — CBC WITH DIFFERENTIAL/PLATELET
Abs Immature Granulocytes: 0.03 10*3/uL (ref 0.00–0.07)
Basophils Absolute: 0 10*3/uL (ref 0.0–0.1)
Basophils Relative: 0 %
Eosinophils Absolute: 0.1 10*3/uL (ref 0.0–0.5)
Eosinophils Relative: 1 %
HCT: 26.5 % — ABNORMAL LOW (ref 39.0–52.0)
Hemoglobin: 8.9 g/dL — ABNORMAL LOW (ref 13.0–17.0)
Immature Granulocytes: 0 %
Lymphocytes Relative: 19 %
Lymphs Abs: 1.5 10*3/uL (ref 0.7–4.0)
MCH: 30 pg (ref 26.0–34.0)
MCHC: 33.6 g/dL (ref 30.0–36.0)
MCV: 89.2 fL (ref 80.0–100.0)
Monocytes Absolute: 0.9 10*3/uL (ref 0.1–1.0)
Monocytes Relative: 11 %
Neutro Abs: 5.5 10*3/uL (ref 1.7–7.7)
Neutrophils Relative %: 69 %
Platelets: 149 10*3/uL — ABNORMAL LOW (ref 150–400)
RBC: 2.97 MIL/uL — ABNORMAL LOW (ref 4.22–5.81)
RDW: 13.4 % (ref 11.5–15.5)
WBC: 8 10*3/uL (ref 4.0–10.5)
nRBC: 0.2 % (ref 0.0–0.2)

## 2020-10-07 LAB — GLUCOSE, CAPILLARY
Glucose-Capillary: 97 mg/dL (ref 70–99)
Glucose-Capillary: 72 mg/dL (ref 70–99)
Glucose-Capillary: 107 mg/dL — ABNORMAL HIGH (ref 70–99)
Glucose-Capillary: 125 mg/dL — ABNORMAL HIGH (ref 70–99)
Glucose-Capillary: 135 mg/dL — ABNORMAL HIGH (ref 70–99)

## 2020-10-07 LAB — T4, FREE: Free T4: 1.41 ng/dL — ABNORMAL HIGH (ref 0.61–1.12)

## 2020-10-07 LAB — TSH: TSH: 2.532 u[IU]/mL (ref 0.350–4.500)

## 2020-10-07 NOTE — Progress Notes (Signed)
PROGRESS NOTE  Michael Trevino DJM:[id number] DOB: Feb 23, 1941 DOA: 09/30/2020 PCP: Dettinger, Fransisca Kaufmann, MD  Brief History   [name] a79 y.o.malewith past medical history relevant for HTN, DM2, CKD 5 with nephrotic syndrome,, hypothyroidism, CAD with prior MI, and HLD and obesity who initially presented on 09/30/2020 to the surgical unit for creation of a left arm AV fistula by vascular surgeon Dr. Cherylynn Ridges patient complained of some chest discomfort and shortness of breath, so hisprocedure was canceled,and patient was sent to the ED for work-up for his shortness of breath.  Upon presentation the patient was very short of breath and hypoxic required continuous BiPAP initially,also requiring IV nitro drip for elevated BP and CHF flare-up.  received IV Lasix 80 mg x 2 with very limited output. Chest x-ray in the ED showed bilateral pleural effusion left more than right. He underwent successful ultrasound-guided thoracentesis on 09/30/2020 with removal of 1 L from the left pleural space. The results are consistent with a transudative effusion. After the thoracentesis patient was eventually able to come off BiPAP,and transitioned to nasal cannula. Echo performed on 22/29/7989 with diastolic dysfunction, The patient denied fever, chills, productive cough, nausea, vomiting, or diarrhea. No pleuritic chest pain. He does have lower extremity edema. Creatinine was elevated at 5.5 with acidosis and hyperuricemia. His hemoglobin is close to baseline.  LFTs are not elevated. BNP is elevated at 1022 and chest x-ray consistent with CHF with effusions. Troponin is 194 in the setting of renal failure.  Echocardiogram from 09/30/2020 has demonstrated EF of 55-60% with severe LVH, and a small pericardial effusion without significant valvular abnormalities and no significant wall motion abnormalities. No sign of tamponade. Moderate AV regurgitation. Moderate aortic stenosis.   He was started on a  heparin drip by cardiology. They performed LHC on 10/04/2020 after HD was initiated. It demonstrated an occluded RCA with grade 3 left to right collaterals. There was also a 50% mid LAD lesion. LVEDP was only mildly elevated at 19. They feel that his elevated troponins on admission were due to demand ischemia due to volume overload and collateral insufficiency. They have recommended medical therapy.   The patient has had placement of a left arm first stage basilic AV fistula created and the insertion of a right internal jugular tunneled dialysis catheter.   Dialysis navigator has set the patient up for outpatient dialysis on a TTS schedule.   The patient has been evaluated by PT/OT and they have recommended SNF. TOC has been consulted and they have begun the process of finding placement for the patient.  Consultants  . Nephrology . Cardiology . Vascular surgery . Interventional Radiology  Procedures  . Thoracentesis . Placement of AV fistula . Placement of tunneled temporary dialysis catheter . Initiation of HD . Left Heart catheterization.  Antibiotics   Anti-infectives (From admission, onward)   Start     Dose/Rate Route Frequency Ordered Stop   10/05/20 1307  ceFAZolin (ANCEF) 2-4 GM/100ML-% IVPB       Note to Pharmacy: [name]   : cabinet override      10/05/20 1307 10/06/20 0114     Interval History/Subjective  The patient is resting comfortably. No new complaints.  Objective   Vitals:  Vitals:   10/07/20 0700 10/07/20 0757  BP:  (!) 146/49  Pulse: 74 78  Resp: (!) 0 14  Temp:  98.2 F (36.8 C)  SpO2: 96% 93%   Exam:  Constitutional:  . The patient is awake, alert, and oriented x  3. No acute distress. Respiratory:  . No increased work of breathing. . No wheezes, rales, or rhonchi . No tactile fremitus Cardiovascular:  . Regular rate and rhythm . No murmurs, ectopy, or gallups. . No lateral PMI. No thrills. Abdomen:  . Abdomen is soft, non-tender,  non-distended . No hernias, masses, or organomegaly . Normoactive bowel sounds.  Musculoskeletal:  . No cyanosis, clubbing, or edema Skin:  . No rashes, lesions, ulcers . palpation of skin: no induration or nodules Neurologic:  . CN 2-12 intact . Sensation all 4 extremities intact Psychiatric:  . Mental status o Mood, affect appropriate o Orientation to person, place, time  . judgment and insight appear intact   I have personally reviewed the following:   Today's Data  . Vitals, Pleural fluid studies, CMP  Micro Data  . 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 CXR  Cardiology Data  . Echocardiogram . Left Heart Catheterization 10/04/2020  Scheduled Meds: . sodium chloride   Intravenous Once  . sodium chloride   Intravenous Once  . amLODipine  10 mg Oral Daily  . ascorbic acid  500 mg Oral Daily  . aspirin  81 mg Oral Daily  . atorvastatin  80 mg Oral Daily  . calcitRIOL  0.5 mcg Oral Daily  . Chlorhexidine Gluconate Cloth  6 each Topical Daily  . Chlorhexidine Gluconate Cloth  6 each Topical Q0600  . Chlorhexidine Gluconate Cloth  6 each Topical Q0600  . cholecalciferol  1,000 Units Oral Daily  . feeding supplement (NEPRO CARB STEADY)  237 mL Oral BID BM  . heparin  5,000 Units Subcutaneous Q8H  . hydrALAZINE  50 mg Oral TID  . insulin aspart  0-5 Units Subcutaneous QHS  . insulin aspart  0-9 Units Subcutaneous TID WC  . insulin detemir  25 Units Subcutaneous QHS  . levothyroxine  175 mcg Oral QAC breakfast  . metoprolol tartrate  100 mg Oral BID  . multivitamin  1 tablet Oral QHS  . sodium chloride flush  3 mL Intravenous Q12H  . sodium chloride flush  3 mL Intravenous Q12H  . sodium chloride flush  3 mL Intravenous Q12H   Continuous Infusions: . sodium chloride    . sodium chloride      Principal Problem:   Acute exacerbation of CHF (congestive heart failure) (HCC) Active Problems:   Hypothyroidism   Illiterate   CAD (coronary artery disease)    Non-ST elevation (NSTEMI) myocardial infarction [facility])   Essential hypertension, benign   CKD stage 5 due to type 2 diabetes mellitus (HCC)   Pleural effusion--Lt > Rt   Acute respiratory failure with hypoxia (HCC)   LOS: 7 days   A & P  HPpEF: Acute on chronic CHF exacerbation. Echocardiogram from 09/30/2020 has demonstrated EF of 55-60% with severe LVH, and a small pericardial effusion without significant valvular abnormalities and no significant wall motion abnormalities. No sign of tamponade. Moderate AV regurgitation. Moderate aortic stenosis. The patient is currently 13.59 L negative in terms of fluid balance.   CAD: LHC performed on 10/04/2020.  It demonstrated an occluded RCA with grade 3 left to right collaterals. There was also a 50% mid LAD lesion. LVEDP was only mildly elevated at 19. They feel that his elevated troponins on admission were due to demand ischemia due to volume overload and collateral insufficiency. They have recommended medical therapy.   Pleural effusion Lt greater than Rt: 1L cloudy amber fluid was removed from the patient's left  pleural space. It appears to be transudative in etiology. The studies were not able to perform microbiological studies on the fluid.   Acute hypoxic respiratory failure: Due to pulmonary edema and left pleural effusion. This afternoon the patient is saturating 90% on room air. The patient required 10L by HFNC on admission. He is currently saturating at 93% on 2L by nasal cannula.  NSTEMI:  Cardiology feels that his elevated troponins on admission were due to demand ischemia due to volume overload and collateral insufficiency.  CKD V. Now on HD: The patient has had placement of a left arm first stage basilic AV fistula created and the insertion of a right internal jugular tunneled dialysis catheter.  Dialysis Navigator has set the patient up for outpatient dialysis on a TTS schedule.  Hypothyroidism: Continue Synthroid as at home. Recheck  TSH, FT4 and FT3.  PTH I: elevated due to renal insufficiency.  Essential Hypertension: Patient is normotensive today on amlodipine, hydralazine, and metoprolol.  Debility: PT/OT have recommended SNF placement as this patient is too weak even to move from bed to chair without 2+ assistance. TOC is seeking placement.  I have seen and examined this patient myself. I have spent 34 minutes in his evaluation and care.  DVT Prophylaxis: Heparin SQ CODE STATUS: Full Code Family Communication: I have discussed the patient with his spouse who is at bedside Disposition: Status is: Inpatient  Remains inpatient appropriate because:Inpatient level of care appropriate due to severity of illness   Dispo: The patient is from: Home              Anticipated d/c is to: SNF              Anticipated d/c date is: 1 day              Patient currently is not medically stable to d/c.  Trayveon Beckford, DO [facility] Direct contact: see [email or web address]  7PM-7AM contact night coverage as above 10/06/2020, 4:11 PM  LOS: 6 days

## 2020-10-07 NOTE — [place] FL2 (Signed)
[place] MEDICAID FL2 LEVEL OF CARE SCREENING TOOL     IDENTIFICATION  Patient Name: Michael Trevino Birthdate: 1941-07-07 Sex: male Admission Date (Current Location): 09/30/2020  [facility] and [place] Number:  [employer] and Address:  [facility] [place], [place] [address], [place], [place] [address]      Provider Number: [id number]  Attending Physician Name and Address:  Karie Kirks, DO  Relative Name and Phone Number:  Jemal Miskell [phone]    Current Level of Care: Hospital Recommended Level of Care: [place] Prior Approval Number:    Date Approved/Denied:   PASRR Number: [id number]  Discharge Plan: SNF    Current Diagnoses: Patient Active Problem List   Diagnosis Date Noted  . Acute exacerbation of CHF (congestive heart failure) ([place]) 09/30/2020  . CKD stage 5 due to type 2 diabetes mellitus ([place]) 09/30/2020  . Pleural effusion--Lt > Rt 09/30/2020  . Acute on chronic respiratory failure with hypoxia ([place]) 09/30/2020  . Acute respiratory failure with hypoxia ([place]) 09/30/2020  . Cellulitis and abscess of lower extremity 11/30/2019  . CHF (congestive heart failure) ([place]) 09/30/2019  . Fluid overload 09/24/2019  . CKD stage 4 due to type 2 diabetes mellitus ([place]) 01/09/2018  . Essential hypertension, benign 09/11/2017  . Class 2 severe obesity due to excess calories with serious comorbidity and body mass index (BMI) of 37.0 to 37.9 in adult ([place]) 09/11/2017  . Nonrheumatic aortic valve insufficiency 05/03/2017  . Uncontrolled type 2 diabetes with stage 4 chronic kidney disease ([place]) 05/03/2017  . Non-ST elevation (NSTEMI) myocardial infarction ([place])   . Arthritis   . Erectile dysfunction 04/11/2013  . Mixed hyperlipidemia 04/11/2013  . Hypothyroidism 02/08/2011  . Illiterate 02/08/2011  . CAD (coronary artery disease) 02/08/2011    Orientation RESPIRATION BLADDER Height & Weight     Self, Place  O2 ([place] 2 Liters)  Incontinent, External catheter Weight: 211 lb 13.8 oz (96.1 kg) Height:  5\' 9"  (175.3 cm)  BEHAVIORAL SYMPTOMS/MOOD NEUROLOGICAL BOWEL NUTRITION STATUS      Continent Diet (see dc summary)  AMBULATORY STATUS COMMUNICATION OF NEEDS Skin   Limited Assist Verbally Normal                       Personal Care Assistance Level of Assistance  Bathing, Feeding, Dressing Bathing Assistance: Limited assistance Feeding assistance: Independent Dressing Assistance: Limited assistance     Functional Limitations Info  Sight, Hearing, Speech Sight Info: Impaired Hearing Info: Adequate Speech Info: Adequate    SPECIAL CARE FACTORS FREQUENCY  OT (By licensed OT), PT (By licensed PT)     PT Frequency: 5x week OT Frequency: 5x week            Contractures Contractures Info: Not present    Additional Factors Info  Code Status, Allergies, Psychotropic, Insulin Sliding Scale Code Status Info: Full Allergies Info: Zocor Psychotropic Info: traZODone (DESYREL) tablet 50 mg at bedtime as needed Insulin Sliding Scale Info: insulin aspart (novoLOG) injection 0-5 Units daily at bedtime, insulin aspart (novoLOG) injection 0-9 Units three times daily with meals,insulin detemir (LEVEMIR) injection 25 Units daily at bedtime,       Current Medications (10/07/2020):  This is the current hospital active medication list Current Facility-Administered Medications  Medication Dose Route Frequency Provider Last Rate Last Admin  . 0.9 %  sodium chloride infusion (Manually program via Guardrails IV Fluids)   Intravenous Once Baglia, Corrina, PA-C      .  0.9 %  sodium chloride infusion (Manually program via Guardrails IV Fluids)   Intravenous Once Baglia, Corrina, PA-C      . 0.9 %  sodium chloride infusion  250 mL Intravenous PRN Baglia, Corrina, PA-C      . 0.9 %  sodium chloride infusion  250 mL Intravenous PRN Baglia, Corrina, PA-C      . acetaminophen (TYLENOL) tablet 650 mg  650 mg Oral Q4H PRN  Baglia, Corrina, PA-C      . amLODipine (NORVASC) tablet 10 mg  10 mg Oral Daily Baglia, Corrina, PA-C   10 mg at 10/07/20 1016  . ascorbic acid (VITAMIN C) tablet 500 mg  500 mg Oral Daily Baglia, Corrina, PA-C   500 mg at 10/07/20 1015  . aspirin chewable tablet 81 mg  81 mg Oral Daily Baglia, Corrina, PA-C   81 mg at 10/07/20 1014  . atorvastatin (LIPITOR) tablet 80 mg  80 mg Oral Daily Baglia, Corrina, PA-C   80 mg at 10/07/20 1014  . bisacodyl (DULCOLAX) suppository 10 mg  10 mg Rectal Daily PRN Baglia, Corrina, PA-C      . calcitRIOL (ROCALTROL) capsule 0.5 mcg  0.5 mcg Oral Daily Baglia, Corrina, PA-C   0.5 mcg at 10/07/20 1014  . Chlorhexidine Gluconate Cloth 2 % PADS 6 each  6 each Topical Daily Baglia, Corrina, PA-C   6 each at 10/03/20 1215  . Chlorhexidine Gluconate Cloth 2 % PADS 6 each  6 each Topical Q0600 Baglia, Corrina, PA-C   6 each at 10/06/20 0611  . Chlorhexidine Gluconate Cloth 2 % PADS 6 each  6 each Topical Q0600 Claudia Desanctis, MD   6 each at 10/07/20 0615  . chlorproMAZINE (THORAZINE) tablet 10 mg  10 mg Oral QID PRN Swayze, Ava, DO   10 mg at 10/06/20 2123  . cholecalciferol (VITAMIN D3) tablet 1,000 Units  1,000 Units Oral Daily Baglia, Corrina, PA-C   1,000 Units at 10/07/20 1015  . feeding supplement (NEPRO CARB STEADY) liquid 237 mL  237 mL Oral BID BM Swayze, Ava, DO      . heparin injection 5,000 Units  5,000 Units Subcutaneous Q8H Baglia, Corrina, PA-C   5,000 Units at 10/07/20 0615  . hydrALAZINE (APRESOLINE) tablet 50 mg  50 mg Oral TID Baglia, Corrina, PA-C   50 mg at 10/07/20 1015  . insulin aspart (novoLOG) injection 0-5 Units  0-5 Units Subcutaneous QHS Baglia, Corrina, PA-C   2 Units at 10/05/20 2118  . insulin aspart (novoLOG) injection 0-9 Units  0-9 Units Subcutaneous TID WC Baglia, Corrina, PA-C   1 Units at 10/06/20 1734  . insulin detemir (LEVEMIR) injection 25 Units  25 Units Subcutaneous QHS Baglia, Corrina, PA-C   25 Units at 10/06/20 2123  .  levothyroxine (SYNTHROID) tablet 175 mcg  175 mcg Oral QAC breakfast Baglia, Corrina, PA-C   175 mcg at 10/07/20 0615  . lidocaine (XYLOCAINE) 1 % (with pres) injection    PRN Greggory Keen, MD   5 mL at 10/01/20 1537  . metoprolol tartrate (LOPRESSOR) tablet 100 mg  100 mg Oral BID Baglia, Corrina, PA-C   100 mg at 10/07/20 1015  . morphine 2 MG/ML injection 2 mg  2 mg Intravenous Q1H PRN Baglia, Corrina, PA-C      . multivitamin (RENA-VIT) tablet 1 tablet  1 tablet Oral QHS Swayze, Ava, DO   1 tablet at 10/06/20 2122  . nitroGLYCERIN (NITROSTAT) SL tablet 0.4 mg  0.4 mg Sublingual Q5  min PRN Baglia, Corrina, PA-C      . ondansetron (ZOFRAN) injection 4 mg  4 mg Intravenous Q6H PRN Baglia, Corrina, PA-C      . ondansetron (ZOFRAN) tablet 4 mg  4 mg Oral Q6H PRN Baglia, Corrina, PA-C      . oxyCODONE-acetaminophen (PERCOCET/ROXICET) 5-325 MG per tablet 1 tablet  1 tablet Oral Q4H PRN Baglia, Corrina, PA-C      . polyethylene glycol (MIRALAX / GLYCOLAX) packet 17 g  17 g Oral Daily PRN Baglia, Corrina, PA-C      . sodium chloride flush (NS) 0.9 % injection 3 mL  3 mL Intravenous Q12H Baglia, Corrina, PA-C   3 mL at 10/04/20 1000  . sodium chloride flush (NS) 0.9 % injection 3 mL  3 mL Intravenous PRN Baglia, Corrina, PA-C      . sodium chloride flush (NS) 0.9 % injection 3 mL  3 mL Intravenous Q12H Baglia, Corrina, PA-C   3 mL at 10/06/20 2125  . sodium chloride flush (NS) 0.9 % injection 3 mL  3 mL Intravenous Q12H Baglia, Corrina, PA-C   3 mL at 10/06/20 2124  . sodium chloride flush (NS) 0.9 % injection 3 mL  3 mL Intravenous PRN Baglia, Corrina, PA-C      . traZODone (DESYREL) tablet 50 mg  50 mg Oral QHS PRN Baglia, Corrina, PA-C   50 mg at 10/05/20 2117     Discharge Medications: Please see discharge summary for a list of discharge medications.  Relevant Imaging Results:  Relevant Lab Results:   Additional Information SSN [id number]   HD TTHS @[facility]  in [place], chair time  [place], LCSWA

## 2020-10-07 NOTE — TOC Initial Note (Signed)
Transition of Care [facility]) - Initial/Assessment Note    Patient Details  Name: Michael Trevino MRN: [id number] Date of Birth: [birth year]  Transition of Care [facility]) CM/SW Contact:    Vinie Sill, [place] Phone Number: 10/07/2020, 11:42 AM  Clinical Narrative:                  CSW spoke with patient's wife, [name]. CSW introduced self and explained role. CSW discussed with [name] PT recommendation for short term rehab at [facility]. She states she is agreeable to short term rehab at [facility]. CSW explained the SNF process.   CSW will provide bed offers once available. Patient will need insurance authorization and covid test  prior to discharge TOC will continue to follow and assist with discharge planning  Thurmond Butts, MSW, [place] Social Worker     Expected Discharge Plan: Skilled Nursing Facility Barriers to Discharge: Ship broker, Continued Medical Work up, SNF Pending bed offer   Patient Goals and CMS Choice        Expected Discharge Plan and Services Expected Discharge Plan: [place] In-house Referral: Clinical Social Work     Living arrangements for the past 2 months: Single Family Home                                      Prior Living Arrangements/Services Living arrangements for the past 2 months: Single Family Home Lives with:: Self, Spouse Patient language and need for interpreter reviewed:: No        Need for Family Participation in Patient Care: Yes (Comment) Care giver support system in place?: Yes (comment)   Criminal Activity/Legal Involvement Pertinent to Current Situation/Hospitalization: No - Comment as needed  Activities of Daily Living Home Assistive Devices/Equipment: Dentures (specify type) ADL Screening (condition at time of admission) Patient's cognitive ability adequate to safely complete daily activities?: Yes Is the patient deaf or have difficulty hearing?: No Does the patient have difficulty  seeing, even when wearing glasses/contacts?: No Does the patient have difficulty concentrating, remembering, or making decisions?: No Patient able to express need for assistance with ADLs?: Yes Does the patient have difficulty dressing or bathing?: No Independently performs ADLs?: Yes (appropriate for developmental age) Does the patient have difficulty walking or climbing stairs?: Yes Weakness of Legs: Both Weakness of Arms/Hands: None  Permission Sought/Granted Permission sought to share information with : Family Supports Permission granted to share information with : Yes, Verbal Permission Granted  Share Information with NAME: Midred Braddy  Permission granted to share info w AGENCY: SNFs  Permission granted to share info w Relationship: spouse  Permission granted to share info w Contact Information: [phone]  Emotional Assessment   Attitude/Demeanor/Rapport: Unable to Assess Affect (typically observed): Unable to Assess Orientation: : Oriented to Self Alcohol / Substance Use: Not Applicable Psych Involvement: No (comment)  Admission diagnosis:  Acute exacerbation of CHF (congestive heart failure) (HCC) [K56.2] Acute systolic congestive heart failure (HCC) [I50.21] ESRD needing dialysis ([place]) [N18.6, Z99.2] Dyspnea and respiratory abnormalities [R06.00, R06.89] Acute on chronic congestive heart failure, unspecified heart failure type [facility]) [I50.9] Patient Active Problem List   Diagnosis Date Noted  . Acute exacerbation of CHF (congestive heart failure) ([place]) 09/30/2020  . CKD stage 5 due to type 2 diabetes mellitus ([place]) 09/30/2020  . Pleural effusion--Lt > Rt 09/30/2020  . Acute on chronic respiratory failure with hypoxia ([place]) 09/30/2020  . Acute respiratory failure  with hypoxia ([place]) 09/30/2020  . Cellulitis and abscess of lower extremity 11/30/2019  . CHF (congestive heart failure) ([place]) 09/30/2019  . Fluid overload 09/24/2019  . CKD stage 4 due to type 2 diabetes  mellitus ([place]) 01/09/2018  . Essential hypertension, benign 09/11/2017  . Class 2 severe obesity due to excess calories with serious comorbidity and body mass index (BMI) of 37.0 to 37.9 in adult ([place]) 09/11/2017  . Nonrheumatic aortic valve insufficiency 05/03/2017  . Uncontrolled type 2 diabetes with stage 4 chronic kidney disease ([place]) 05/03/2017  . Non-ST elevation (NSTEMI) myocardial infarction ([place])   . Arthritis   . Erectile dysfunction 04/11/2013  . Mixed hyperlipidemia 04/11/2013  . Hypothyroidism 02/08/2011  . Illiterate 02/08/2011  . CAD (coronary artery disease) 02/08/2011   PCP:  Dettinger, Fransisca Kaufmann, MD Pharmacy:   [id number] [place], [place] [address] Phone: [phone] Fax: [phone]     Social Determinants of Health (SDOH) Interventions    Readmission Risk Interventions No flowsheet data found.

## 2020-10-07 NOTE — Plan of Care (Signed)
  Problem: Education: Goal: Knowledge of General Education information will improve Description Including pain rating scale, medication(s)/side effects and non-pharmacologic comfort measures Outcome: Progressing   

## 2020-10-07 NOTE — Progress Notes (Signed)
[facility] Progress Note  Name: Michael Trevino MRN: [id number] DOB: 1941-07-11  Subjective:  Had last HD on 11/24 with 2.5 kg UF charted.  Had 650 mL charted UOP over 11/24.  Weaned to 1 liters oxygen yesterday and RN reports was up to 3 liters oxygen overnight.  She has just turned down to 2.  She got him up yesterday to the bedside commode and he was a 2-person assist to do that and to sit up.   Review of systems:    Denies shortness of breath - states much better. Denies chest pain  Denies n/v/d   Intake/Output Summary (Last 24 hours) at 10/07/2020 0712 Last data filed at 10/07/2020 0618 Gross per 24 hour  Intake 240 ml  Output 3150 ml  Net -2910 ml    Vitals:  Vitals:   10/06/20 2122 10/06/20 2336 10/07/20 0355 10/07/20 0430  BP: (!) 146/49 (!) 142/49 (!) 147/58   Pulse: 86 80 86   Resp:  19 19   Temp:  98.3 F (36.8 C) 99.5 F (37.5 C)   TempSrc:  Oral Oral   SpO2:  100%    Weight:    96.1 kg  Height:    5\' 9"  (1.753 m)     Physical Exam:  General adult male in bed in no acute distress HEENT normocephalic atraumatic extraocular movements intact sclera anicteric Neck supple trachea midline Lungs clear but slightly reduced bilaterally normal work of breathing at rest; on 2 liters oxygen Heart S1S2 no rub appreciated Abdomen soft nontender obese habitus  Extremities no edema lower extremities Psych normal mood and affect Neuro alert and oriented provides hx and follows commands Access RIJ tunneled catheter and LUE AVF with bruit and thrill    Medications reviewed   Labs:  BMP Latest Ref Rng & Units 10/07/2020 10/06/2020 10/05/2020  Glucose 70 - 99 mg/dL 98 192(H) 94  BUN 8 - 23 mg/dL 28(H) 36(H) 21  Creatinine 0.61 - 1.24 mg/dL 4.47(H) 5.13(H) 3.40(H)  BUN/Creat Ratio 10 - 24 - - -  Sodium 135 - 145 mmol/L 137 134(L) 137  Potassium 3.5 - 5.1 mmol/L 3.8 4.2 3.3(L)  Chloride 98 - 111 mmol/L 100 98 99  CO2 22 - 32 mmol/L 27 24 -  Calcium 8.9  - 10.3 mg/dL 7.7(L) 7.3(L) -     Assessment/Plan:    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  s/p RIJ tunn catheter and LUE AVF on 11/23 with vascular.    Next HD is for 11/27 (here vs. Outpatient depending on dispo)  He has an outpatient unit - [facility] and can start there as early as 11/27   Changed to renal diet  Acute hypoxic respiratory failure with large left pleural effusion.  Improving with UF with HD.   Left Pleural effusion - s/p left thoracentesis with 1 L of blood-tinged tinged fluid removed. Per primary team  NSTEMI - presented with chest pain transferred from urgent care for cardiology plan for cardiac catheterization on 11/22 per charting.  Medical management of chronic RCA occlusion per cardiology note.   Hypertension - UF as tolerated with HD.  Improved/acceptable control.   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  Discontinued calcium carbonate with meals  Anemia CKD - s/p aranesp 40 mcg once on 11/23 and it appears again on 11/24 - moved per pharmacy.  Iron saturations 11%. Had Nulecit  on 11/24.  Had 2 units PRBC's on 11/24  Disposition per primary team.  He has been recommended for SNF placement.  Note patient been accepted to an outpatient unit for starting there as early as 11/27.  [facility] with 10:45 chair time.    When he is discharged (if he goes over the weekend), nephrology will need to call [name] to notify them and to fax them the discharge summary.  [name] phone:  [phone] and Fax: [phone]  Claudia Desanctis, MD 10/07/2020 7:31 AM

## 2020-10-08 DIAGNOSIS — I10 Essential (primary) hypertension: Secondary | ICD-10-CM | POA: Diagnosis not present

## 2020-10-08 DIAGNOSIS — I5033 Acute on chronic diastolic (congestive) heart failure: Secondary | ICD-10-CM | POA: Diagnosis not present

## 2020-10-08 DIAGNOSIS — J9601 Acute respiratory failure with hypoxia: Secondary | ICD-10-CM | POA: Diagnosis not present

## 2020-10-08 DIAGNOSIS — I2510 Atherosclerotic heart disease of native coronary artery without angina pectoris: Secondary | ICD-10-CM | POA: Diagnosis not present

## 2020-10-08 LAB — GLUCOSE, CAPILLARY
Glucose-Capillary: 133 mg/dL — ABNORMAL HIGH (ref 70–99)
Glucose-Capillary: 133 mg/dL — ABNORMAL HIGH (ref 70–99)
Glucose-Capillary: 121 mg/dL — ABNORMAL HIGH (ref 70–99)
Glucose-Capillary: 160 mg/dL — ABNORMAL HIGH (ref 70–99)

## 2020-10-08 LAB — RENAL FUNCTION PANEL
Albumin: 2.2 g/dL — ABNORMAL LOW (ref 3.5–5.0)
Anion gap: 16 — ABNORMAL HIGH (ref 5–15)
BUN: 51 mg/dL — ABNORMAL HIGH (ref 8–23)
CO2: 23 mmol/L (ref 22–32)
Calcium: 7.6 mg/dL — ABNORMAL LOW (ref 8.9–10.3)
Chloride: 95 mmol/L — ABNORMAL LOW (ref 98–111)
Creatinine, Ser: 7.36 mg/dL — ABNORMAL HIGH (ref 0.61–1.24)
GFR, Estimated: 7 mL/min — ABNORMAL LOW (ref >60)
Glucose, Bld: 121 mg/dL — ABNORMAL HIGH (ref 70–99)
Phosphorus: 3.7 mg/dL (ref 2.5–4.6)
Potassium: 3.5 mmol/L (ref 3.5–5.1)
Sodium: 134 mmol/L — ABNORMAL LOW (ref 135–145)

## 2020-10-08 LAB — T3, FREE: T3, Free: 1 pg/mL — ABNORMAL LOW (ref 2.0–4.4)

## 2020-10-08 NOTE — Progress Notes (Signed)
[facility] Progress Note  Name: Michael Trevino MRN: [id number] DOB: Jul 22, 1941  Subjective:  Endorses shortness of breath but overall unchanged.  Denies chest pain or fevers or chills.  Review of systems:    12 systems reviewed and negative except per HPI   Intake/Output Summary (Last 24 hours) at 10/08/2020 0907 Last data filed at 10/07/2020 2127 Gross per 24 hour  Intake 470 ml  Output 550 ml  Net -80 ml    Vitals:  Vitals:   10/07/20 2333 10/08/20 0300 10/08/20 0500 10/08/20 0805  BP: (!) 141/50 (!) 130/52  (!) 145/47  Pulse: 72 71  78  Resp: 20 20  19   Temp: 98.2 F (36.8 C) 98.6 F (37 C)  (!) 97.5 F (36.4 C)  TempSrc: Oral Oral  Oral  SpO2: 91% 92%    Weight:   97.4 kg   Height:         Physical Exam:  GEN: Elderly, sitting in bed, nad ENT: no nasal discharge, mmm EYES: no scleral icterus, eomi CV: normal rate, no rub PULM: no iwob, bilateral chest rise, decreased breath sounds bilaterally ABD: NABS, non-distended SKIN: no rashes or jaundice EXT: no edema, warm and well perfused Access: RIJ TDC, LUE AVF    Scheduled Meds: . sodium chloride   Intravenous Once  . sodium chloride   Intravenous Once  . amLODipine  10 mg Oral Daily  . ascorbic acid  500 mg Oral Daily  . aspirin  81 mg Oral Daily  . atorvastatin  80 mg Oral Daily  . calcitRIOL  0.5 mcg Oral Daily  . Chlorhexidine Gluconate Cloth  6 each Topical Daily  . Chlorhexidine Gluconate Cloth  6 each Topical Q0600  . Chlorhexidine Gluconate Cloth  6 each Topical Q0600  . cholecalciferol  1,000 Units Oral Daily  . feeding supplement (NEPRO CARB STEADY)  237 mL Oral BID BM  . heparin  5,000 Units Subcutaneous Q8H  . hydrALAZINE  50 mg Oral TID  . insulin aspart  0-5 Units Subcutaneous QHS  . insulin aspart  0-9 Units Subcutaneous TID WC  . insulin detemir  25 Units Subcutaneous QHS  . levothyroxine  175 mcg Oral QAC breakfast  . metoprolol tartrate  100 mg Oral BID  .  multivitamin  1 tablet Oral QHS  . sodium chloride flush  3 mL Intravenous Q12H  . sodium chloride flush  3 mL Intravenous Q12H  . sodium chloride flush  3 mL Intravenous Q12H   Continuous Infusions: . sodium chloride    . sodium chloride     PRN Meds:.sodium chloride, sodium chloride, acetaminophen, bisacodyl, chlorproMAZINE, lidocaine, morphine injection, nitroGLYCERIN, ondansetron (ZOFRAN) IV, ondansetron **OR** [DISCONTINUED] ondansetron (ZOFRAN) IV, oxyCODONE-acetaminophen, polyethylene glycol, sodium chloride flush, sodium chloride flush, traZODone   Labs:  BMP Latest Ref Rng & Units 10/08/2020 10/07/2020 10/06/2020  Glucose 70 - 99 mg/dL 121(H) 98 192(H)  BUN 8 - 23 mg/dL 51(H) 28(H) 36(H)  Creatinine 0.61 - 1.24 mg/dL 7.36(H) 4.47(H) 5.13(H)  BUN/Creat Ratio 10 - 24 - - -  Sodium 135 - 145 mmol/L 134(L) 137 134(L)  Potassium 3.5 - 5.1 mmol/L 3.5 3.8 4.2  Chloride 98 - 111 mmol/L 95(L) 100 98  CO2 22 - 32 mmol/L 23 27 24   Calcium 8.9 - 10.3 mg/dL 7.6(L) 7.7(L) 7.3(L)     Assessment/Plan:    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  s/p RIJ tunn catheter and LUE AVF on 11/23 with vascular.    Next  HD is for 11/27 (here vs. Outpatient depending on dispo)  He has an outpatient unit - [facility] and can start there as early as 11/27   Acute hypoxic respiratory failure with large left pleural effusion.  Improving with UF with HD.   Left Pleural effusion - s/p left thoracentesis with 1 L of blood-tinged tinged fluid removed. Per primary team  NSTEMI - presented with chest pain transferred from urgent care for cardiology plan for cardiac catheterization on 11/22 per charting.  Medical management of chronic RCA occlusion per cardiology note.   Hypertension - UF as tolerated with HD.  Improved/acceptable control.   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 No binder needed  Anemia CKD - s/p aranesp 40 mcg once on 11/23 and it  appears again on 11/24 - moved per pharmacy.  Iron saturations 11%. Had Nulecit on 11/24.  Had 2 units PRBC's on 11/24  Disposition per primary team.  He has been recommended for SNF placement.  Note patient been accepted to an outpatient unit for starting there as early as 11/27.  [facility] with 10:45 chair time.    When he is discharged (if he goes over the weekend), nephrology will need to call [name] to notify them and to fax them the discharge summary.  [name] phone:  [phone] and Fax: [phone]  Reesa Chew, MD 10/08/2020 9:07 AM

## 2020-10-08 NOTE — Plan of Care (Signed)
  Problem: Education: Goal: Knowledge of General Education information will improve Description: Including pain rating scale, medication(s)/side effects and non-pharmacologic comfort measures Outcome: Progressing   Problem: Health Behavior/Discharge Planning: Goal: Ability to manage health-related needs will improve Outcome: Progressing   Problem: Clinical Measurements: Goal: Ability to maintain clinical measurements within normal limits will improve Outcome: Progressing Goal: Will remain free from infection Outcome: Progressing Goal: Diagnostic test results will improve Outcome: Progressing Goal: Respiratory complications will improve Outcome: Progressing Goal: Cardiovascular complication will be avoided Outcome: Progressing   Problem: Activity: Goal: Risk for activity intolerance will decrease Outcome: Progressing   Problem: Nutrition: Goal: Adequate nutrition will be maintained Outcome: Progressing   Problem: Coping: Goal: Level of anxiety will decrease Outcome: Progressing   Problem: Elimination: Goal: Will not experience complications related to bowel motility Outcome: Progressing Goal: Will not experience complications related to urinary retention Outcome: Progressing   Problem: Pain Managment: Goal: General experience of comfort will improve Outcome: Progressing   Problem: Pain Managment: Goal: General experience of comfort will improve Outcome: Progressing   Problem: Safety: Goal: Ability to remain free from injury will improve Outcome: Progressing   Problem: Skin Integrity: Goal: Risk for impaired skin integrity will decrease Outcome: Progressing   Problem: Education: Goal: Knowledge of disease and its progression will improve Outcome: Progressing Goal: Individualized Educational Video(s) Outcome: Progressing   Problem: Fluid Volume: Goal: Compliance with measures to maintain balanced fluid volume will improve Outcome: Progressing   Problem:  Health Behavior/Discharge Planning: Goal: Ability to manage health-related needs will improve Outcome: Progressing   Problem: Nutritional: Goal: Ability to make healthy dietary choices will improve Outcome: Progressing   Problem: Clinical Measurements: Goal: Complications related to the disease process, condition or treatment will be avoided or minimized Outcome: Progressing

## 2020-10-08 NOTE — Plan of Care (Signed)

## 2020-10-08 NOTE — Progress Notes (Signed)
Physical Therapy Treatment Patient Details Name: Michael Trevino MRN: [id number] DOB: 1941/08/06 Today's Date: 10/08/2020    History of Present Illness  Michael Trevino  is a 79 y.o. male with PMH relevant for HTN, DM2, CKD 5 with nephrotic syndrome,hypothyroidism, CAD with prior MI, and HLD and obesity who initially presented on 09/30/2020 to the surgical unit for creation of a left arm AV fistula, however patient complained of some chest discomfort and shortness of breath, so his procedure was canceled, and patient was sent to the ED for work-up for his shortness of breath. Admitted with HFpEF. S/p cardiac cath and placement of RIJ tunneled dialysis catheter.    PT Comments    Pt supine in bed on arrival.  Pt pleasant and motivated to participate in PT session this pm.  Focused on transfer training and moving OOB to recliner.  Pt tolerated session well.  Continue to recommend snf as he continues to require external assistance.      Follow Up Recommendations  SNF     Equipment Recommendations  3in1 (PT);Wheelchair (measurements PT);Wheelchair cushion (measurements PT)    Recommendations for Other Services       Precautions / Restrictions Precautions Precautions: Fall Restrictions Weight Bearing Restrictions: No    Mobility  Bed Mobility Overal bed mobility: Needs Assistance Bed Mobility: Supine to Sit     Supine to sit: Min assist     General bed mobility comments: MIn assistance to move LEs to edge of bed and elevate trunk into a seated position.  Pt utilized bed rail for support.  Transfers Overall transfer level: Needs assistance Equipment used: Rolling walker (2 wheeled);None Transfers: Sit to/from [facility] Sit to Stand: Mod assist         General transfer comment: Cues for hand placement to and from seated surface.  Pt with poor eccentic load from stand to sit.  Ambulation/Gait Ambulation/Gait assistance: Mod assist Gait Distance (Feet): 4  Feet Assistive device: Rolling walker (2 wheeled) Gait Pattern/deviations: Step-to pattern;Trunk flexed;Shuffle     General Gait Details: Shuffling steps from bed to recliner.   Stairs             Wheelchair Mobility    Modified Rankin (Stroke Patients Only)       Balance Overall balance assessment: Needs assistance Sitting-balance support: Feet supported;No upper extremity supported Sitting balance-Leahy Scale: Fair       Standing balance-Leahy Scale: Poor Standing balance comment: reliant on external support                            Cognition Arousal/Alertness: Awake/alert Behavior During Therapy: WFL for tasks assessed/performed Overall Cognitive Status:  (not formally assessed but able to follow commands and interact appropriate during session.)                                        Exercises      General Comments        Pertinent Vitals/Pain Pain Assessment: Faces Faces Pain Scale: Hurts a little bit Pain Location: RUE (catheter site) Pain Descriptors / Indicators: Guarding Pain Intervention(s): Monitored during session;Repositioned    Home Living                      Prior Function            PT Goals (current  goals can now be found in the care plan section) Acute Rehab PT Goals Patient Stated Goal: go home Potential to Achieve Goals: Fair Progress towards PT goals: Progressing toward goals    Frequency    Min 3X/week      PT Plan Current plan remains appropriate    Co-evaluation              AM-PAC PT "6 Clicks" Mobility   Outcome Measure  Help needed turning from your back to your side while in a flat bed without using bedrails?: A Little Help needed moving from lying on your back to sitting on the side of a flat bed without using bedrails?: A Little Help needed moving to and from a bed to a chair (including a wheelchair)?: A Lot Help needed standing up from a chair using your  arms (e.g., wheelchair or bedside chair)?: A Lot Help needed to walk in hospital room?: A Lot Help needed climbing 3-5 steps with a railing? : Total 6 Click Score: 13    End of Session Equipment Utilized During Treatment: Gait belt Activity Tolerance: Patient tolerated treatment well Patient left: with call bell/phone within reach;Other (comment);in chair;with chair alarm set Nurse Communication: Mobility status PT Visit Diagnosis: Unsteadiness on feet (R26.81);Muscle weakness (generalized) (M62.81);Difficulty in walking, not elsewhere classified (R26.2)     Time: 9774-1423 PT Time Calculation (min) (ACUTE ONLY): 15 min  Charges:  $Therapeutic Activity: 8-22 mins                     Erasmo Leventhal , PTA Acute Rehabilitation Services Pager [phone] Office [phone]     Ayuub Penley Eli Hose 10/08/2020, 2:41 PM

## 2020-10-08 NOTE — Progress Notes (Signed)
PROGRESS NOTE  Michael Trevino CZY:[id number] DOB: [birth year] DOA: 09/30/2020 PCP: Dettinger, Fransisca Kaufmann, MD  Brief History   [name] a79 y.o.malewith past medical history relevant for HTN, DM2, CKD 5 with nephrotic syndrome,, hypothyroidism, CAD with prior MI, and HLD and obesity who initially presented on 09/30/2020 to the surgical unit for creation of a left arm AV fistula by vascular surgeon Dr. Cherylynn Ridges patient complained of some chest discomfort and shortness of breath, so hisprocedure was canceled,and patient was sent to the ED for work-up for his shortness of breath.  Upon presentation the patient was very short of breath and hypoxic required continuous BiPAP initially,also requiring IV nitro drip for elevated BP and CHF flare-up.  received IV Lasix 80 mg x 2 with very limited output. Chest x-ray in the ED showed bilateral pleural effusion left more than right. He underwent successful ultrasound-guided thoracentesis on 09/30/2020 with removal of 1 L from the left pleural space. The results are consistent with a transudative effusion. After the thoracentesis patient was eventually able to come off BiPAP,and transitioned to nasal cannula. Echo performed on 09/32/3557 with diastolic dysfunction, The patient denied fever, chills, productive cough, nausea, vomiting, or diarrhea. No pleuritic chest pain. He does have lower extremity edema. Creatinine was elevated at 5.5 with acidosis and hyperuricemia. His hemoglobin is close to baseline.  LFTs are not elevated. BNP is elevated at 1022 and chest x-ray consistent with CHF with effusions. Troponin is 194 in the setting of renal failure.  Echocardiogram from 09/30/2020 has demonstrated EF of 55-60% with severe LVH, and a small pericardial effusion without significant valvular abnormalities and no significant wall motion abnormalities. No sign of tamponade. Moderate AV regurgitation. Moderate aortic stenosis.   He was started on a  heparin drip by cardiology. They performed LHC on 10/04/2020 after HD was initiated. It demonstrated an occluded RCA with grade 3 left to right collaterals. There was also a 50% mid LAD lesion. LVEDP was only mildly elevated at 19. They feel that his elevated troponins on admission were due to demand ischemia due to volume overload and collateral insufficiency. They have recommended medical therapy.   The patient has had placement of a left arm first stage basilic AV fistula created and the insertion of a right internal jugular tunneled dialysis catheter.   Dialysis navigator has set the patient up for outpatient dialysis on a TTS schedule.   The patient has been evaluated by PT/OT and they have recommended SNF. TOC has been consulted and they have begun the process of finding placement for the patient.  Consultants  . Nephrology . Cardiology . Vascular surgery . Interventional Radiology  Procedures  . Thoracentesis . Placement of AV fistula . Placement of tunneled temporary dialysis catheter . Initiation of HD . Left Heart catheterization.  Antibiotics   Anti-infectives (From admission, onward)   Start     Dose/Rate Route Frequency Ordered Stop   10/05/20 1307  ceFAZolin (ANCEF) 2-4 GM/100ML-% IVPB       Note to Pharmacy: [name]   : cabinet override      10/05/20 1307 10/06/20 0114     Interval History/Subjective  The patient is resting comfortably. No new complaints.  Objective   Vitals:  Vitals:   10/08/20 1032 10/08/20 1126  BP:  (!) 139/57  Pulse: 76 78  Resp:  (!) 24  Temp:  (!) 97.2 F (36.2 C)  SpO2:  94%   Exam:  Constitutional:  . The patient is awake, alert, and oriented  x 3. No acute distress. Respiratory:  . No increased work of breathing. . No wheezes, rales, or rhonchi . No tactile fremitus Cardiovascular:  . Regular rate and rhythm . No murmurs, ectopy, or gallups. . No lateral PMI. No thrills. Abdomen:  . Abdomen is soft, non-tender,  non-distended . No hernias, masses, or organomegaly . Normoactive bowel sounds.  Musculoskeletal:  . No cyanosis, clubbing, or edema Skin:  . No rashes, lesions, ulcers . palpation of skin: no induration or nodules Neurologic:  . CN 2-12 intact . Sensation all 4 extremities intact Psychiatric:  . Mental status o Mood, affect appropriate o Orientation to person, place, time  . judgment and insight appear intact   I have personally reviewed the following:   Today's Data  . Vitals, BMP  Micro Data  . 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 CXR  Cardiology Data  . Echocardiogram . Left Heart Catheterization 10/04/2020  Scheduled Meds: . sodium chloride   Intravenous Once  . sodium chloride   Intravenous Once  . amLODipine  10 mg Oral Daily  . ascorbic acid  500 mg Oral Daily  . aspirin  81 mg Oral Daily  . atorvastatin  80 mg Oral Daily  . calcitRIOL  0.5 mcg Oral Daily  . Chlorhexidine Gluconate Cloth  6 each Topical Daily  . Chlorhexidine Gluconate Cloth  6 each Topical Q0600  . Chlorhexidine Gluconate Cloth  6 each Topical Q0600  . cholecalciferol  1,000 Units Oral Daily  . feeding supplement (NEPRO CARB STEADY)  237 mL Oral BID BM  . heparin  5,000 Units Subcutaneous Q8H  . hydrALAZINE  50 mg Oral TID  . insulin aspart  0-5 Units Subcutaneous QHS  . insulin aspart  0-9 Units Subcutaneous TID WC  . insulin detemir  25 Units Subcutaneous QHS  . levothyroxine  175 mcg Oral QAC breakfast  . metoprolol tartrate  100 mg Oral BID  . multivitamin  1 tablet Oral QHS  . sodium chloride flush  3 mL Intravenous Q12H  . sodium chloride flush  3 mL Intravenous Q12H  . sodium chloride flush  3 mL Intravenous Q12H   Continuous Infusions: . sodium chloride    . sodium chloride      Principal Problem:   Acute exacerbation of CHF (congestive heart failure) (HCC) Active Problems:   Hypothyroidism   Illiterate   CAD (coronary artery disease)   Non-ST elevation  (NSTEMI) myocardial infarction [facility])   Essential hypertension, benign   CKD stage 5 due to type 2 diabetes mellitus (HCC)   Pleural effusion--Lt > Rt   Acute respiratory failure with hypoxia (HCC)   LOS: 8 days   A & P  HPpEF: Acute on chronic CHF exacerbation. Echocardiogram from 09/30/2020 has demonstrated EF of 55-60% with severe LVH, and a small pericardial effusion without significant valvular abnormalities and no significant wall motion abnormalities. No sign of tamponade. Moderate AV regurgitation. Moderate aortic stenosis. The patient is currently 13.887 L negative in terms of fluid balance.   CAD: LHC performed on 10/04/2020.  It demonstrated an occluded RCA with grade 3 left to right collaterals. There was also a 50% mid LAD lesion. LVEDP was only mildly elevated at 19. They feel that his elevated troponins on admission were due to demand ischemia due to volume overload and collateral insufficiency. They have recommended medical therapy.   Pleural effusion Lt greater than Rt: 1L cloudy amber fluid was removed from the patient's left pleural space.  It appears to be transudative in etiology. The studies were not able to perform microbiological studies on the fluid.   Acute hypoxic respiratory failure: Due to pulmonary edema and left pleural effusion. This afternoon the patient is saturating 90% on room air. The patient required 10L by HFNC on admission. He is currently saturating at 93% on 2L by nasal cannula.  NSTEMI:  Cardiology feels that his elevated troponins on admission were due to demand ischemia due to volume overload and collateral insufficiency.  CKD V. Now on HD: The patient has had placement of a left arm first stage basilic AV fistula created and the insertion of a right internal jugular tunneled dialysis catheter.  Dialysis Navigator has set the patient up for outpatient dialysis on a TTS schedule.  Hypothyroidism: Continue Synthroid as at home.   PTH I: elevated due to  renal insufficiency.  Essential Hypertension: Patient is normotensive today on amlodipine, hydralazine, and metoprolol.  Debility: PT/OT have recommended SNF placement as this patient is too weak even to move from bed to chair without 2+ assistance. TOC is seeking placement.  I have seen and examined this patient myself. I have spent 30 minutes in his evaluation and care.  DVT Prophylaxis: Heparin SQ CODE STATUS: Full Code Family Communication: None available Disposition: Status is: Inpatient  Remains inpatient appropriate because:Inpatient level of care appropriate due to severity of illness   Dispo: The patient is from: Home              Anticipated d/c is to: SNF              Anticipated d/c date is: 1 day              Patient currently is not medically stable to d/c.  Shatara Stanek, DO [facility] Direct contact: see [email or web address]  7PM-7AM contact night coverage as above 10/08/2020, 3:17 PM  LOS: 6 days

## 2020-10-09 DIAGNOSIS — I10 Essential (primary) hypertension: Secondary | ICD-10-CM | POA: Diagnosis not present

## 2020-10-09 DIAGNOSIS — I5033 Acute on chronic diastolic (congestive) heart failure: Secondary | ICD-10-CM | POA: Diagnosis not present

## 2020-10-09 DIAGNOSIS — J9601 Acute respiratory failure with hypoxia: Secondary | ICD-10-CM | POA: Diagnosis not present

## 2020-10-09 DIAGNOSIS — I2510 Atherosclerotic heart disease of native coronary artery without angina pectoris: Secondary | ICD-10-CM | POA: Diagnosis not present

## 2020-10-09 LAB — CBC
HCT: 25.9 % — ABNORMAL LOW (ref 39.0–52.0)
Hemoglobin: 8.2 g/dL — ABNORMAL LOW (ref 13.0–17.0)
MCH: 29.2 pg (ref 26.0–34.0)
MCHC: 31.7 g/dL (ref 30.0–36.0)
MCV: 92.2 fL (ref 80.0–100.0)
Platelets: 228 10*3/uL (ref 150–400)
RBC: 2.81 MIL/uL — ABNORMAL LOW (ref 4.22–5.81)
RDW: 13.3 % (ref 11.5–15.5)
WBC: 6.4 10*3/uL (ref 4.0–10.5)
nRBC: 0.3 % — ABNORMAL HIGH (ref 0.0–0.2)

## 2020-10-09 LAB — RENAL FUNCTION PANEL
Albumin: 2.1 g/dL — ABNORMAL LOW (ref 3.5–5.0)
Anion gap: 13 (ref 5–15)
BUN: 68 mg/dL — ABNORMAL HIGH (ref 8–23)
CO2: 24 mmol/L (ref 22–32)
Calcium: 7.4 mg/dL — ABNORMAL LOW (ref 8.9–10.3)
Chloride: 97 mmol/L — ABNORMAL LOW (ref 98–111)
Creatinine, Ser: 8.45 mg/dL — ABNORMAL HIGH (ref 0.61–1.24)
GFR, Estimated: 6 mL/min — ABNORMAL LOW (ref >60)
Glucose, Bld: 102 mg/dL — ABNORMAL HIGH (ref 70–99)
Phosphorus: 4.6 mg/dL (ref 2.5–4.6)
Potassium: 4 mmol/L (ref 3.5–5.1)
Sodium: 134 mmol/L — ABNORMAL LOW (ref 135–145)

## 2020-10-09 LAB — GLUCOSE, CAPILLARY
Glucose-Capillary: 93 mg/dL (ref 70–99)
Glucose-Capillary: 137 mg/dL — ABNORMAL HIGH (ref 70–99)
Glucose-Capillary: 176 mg/dL — ABNORMAL HIGH (ref 70–99)

## 2020-10-09 MED ORDER — HEPARIN SODIUM (PORCINE) 1000 UNIT/ML IJ SOLN
INTRAMUSCULAR | Status: AC
  Administered 2020-10-09: 1000 [IU]
  Filled 2020-10-09: qty 4

## 2020-10-09 NOTE — Progress Notes (Signed)
PROGRESS NOTE  Michael Trevino TKZ:[id number] DOB: 04-05-1941 DOA: 09/30/2020 PCP: Dettinger, Fransisca Kaufmann, MD  Brief History   Michael Trevino a79 y.o.malewith past medical history relevant for HTN, DM2, CKD 5 with nephrotic syndrome,, hypothyroidism, CAD with prior MI, and HLD and obesity who initially presented on 09/30/2020 to the surgical unit for creation of a left arm AV fistula by vascular surgeon Dr. Cherylynn Ridges patient complained of some chest discomfort and shortness of breath, so hisprocedure was canceled,and patient was sent to the ED for work-up for his shortness of breath.  Upon presentation the patient was very short of breath and hypoxic required continuous BiPAP initially,also requiring IV nitro drip for elevated BP and CHF flare-up.  received IV Lasix 80 mg x 2 with very limited output. Chest x-ray in the ED showed bilateral pleural effusion left more than right. He underwent successful ultrasound-guided thoracentesis on 09/30/2020 with removal of 1 L from the left pleural space. The results are consistent with a transudative effusion. After the thoracentesis patient was eventually able to come off BiPAP,and transitioned to nasal cannula. Echo performed on 57/32/2025 with diastolic dysfunction, The patient denied fever, chills, productive cough, nausea, vomiting, or diarrhea. No pleuritic chest pain. He does have lower extremity edema. Creatinine was elevated at 5.5 with acidosis and hyperuricemia. His hemoglobin is close to baseline.  LFTs are not elevated. BNP is elevated at 1022 and chest x-ray consistent with CHF with effusions. Troponin is 194 in the setting of renal failure.  Echocardiogram from 09/30/2020 has demonstrated EF of 55-60% with severe LVH, and a small pericardial effusion without significant valvular abnormalities and no significant wall motion abnormalities. No sign of tamponade. Moderate AV regurgitation. Moderate aortic stenosis.   He was started on a  heparin drip by cardiology. They performed LHC on 10/04/2020 after HD was initiated. It demonstrated an occluded RCA with grade 3 left to right collaterals. There was also a 50% mid LAD lesion. LVEDP was only mildly elevated at 19. They feel that his elevated troponins on admission were due to demand ischemia due to volume overload and collateral insufficiency. They have recommended medical therapy.   The patient has had placement of a left arm first stage basilic AV fistula created and the insertion of a right internal jugular tunneled dialysis catheter.   Dialysis navigator has set the patient up for outpatient dialysis on a TTS schedule.   The patient has been evaluated by PT/OT and they have recommended SNF. TOC has been consulted and they have begun the process of finding placement for the patient.  Consultants  . Nephrology . Cardiology . Vascular surgery . Interventional Radiology  Procedures  . Thoracentesis . Placement of AV fistula . Placement of tunneled temporary dialysis catheter . Initiation of HD . Left Heart catheterization.  Antibiotics   Anti-infectives (From admission, onward)   Start     Dose/Rate Route Frequency Ordered Stop   10/05/20 1307  ceFAZolin (ANCEF) 2-4 GM/100ML-% IVPB       Note to Pharmacy: [name]   : cabinet override      10/05/20 1307 10/06/20 0114     Interval History/Subjective  The patient is resting comfortably. No new complaints.  Objective   Vitals:  Vitals:   10/09/20 1339 10/09/20 1349  BP:  (!) 128/47  Pulse:    Resp: 20 18  Temp:    SpO2:     Exam:  Constitutional:  . The patient is awake, alert, and oriented x 3. No acute distress.  Respiratory:  . No increased work of breathing. . No wheezes, rales, or rhonchi . No tactile fremitus Cardiovascular:  . Regular rate and rhythm . No murmurs, ectopy, or gallups. . No lateral PMI. No thrills. Abdomen:  . Abdomen is soft, non-tender, non-distended . No hernias,  masses, or organomegaly . Normoactive bowel sounds.  Musculoskeletal:  . No cyanosis, clubbing, or edema Skin:  . No rashes, lesions, ulcers . palpation of skin: no induration or nodules Neurologic:  . CN 2-12 intact . Sensation all 4 extremities intact Psychiatric:  . Mental status o Mood, affect appropriate o Orientation to person, place, time  . judgment and insight appear intact   I have personally reviewed the following:   Today's Data  . Vitals, BMP  Micro Data  . 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 CXR  Cardiology Data  . Echocardiogram . Left Heart Catheterization 10/04/2020  Scheduled Meds: . sodium chloride   Intravenous Once  . sodium chloride   Intravenous Once  . amLODipine  10 mg Oral Daily  . ascorbic acid  500 mg Oral Daily  . aspirin  81 mg Oral Daily  . atorvastatin  80 mg Oral Daily  . calcitRIOL  0.5 mcg Oral Daily  . Chlorhexidine Gluconate Cloth  6 each Topical Daily  . cholecalciferol  1,000 Units Oral Daily  . feeding supplement (NEPRO CARB STEADY)  237 mL Oral BID BM  . heparin  5,000 Units Subcutaneous Q8H  . hydrALAZINE  50 mg Oral TID  . insulin aspart  0-5 Units Subcutaneous QHS  . insulin aspart  0-9 Units Subcutaneous TID WC  . insulin detemir  25 Units Subcutaneous QHS  . levothyroxine  175 mcg Oral QAC breakfast  . metoprolol tartrate  100 mg Oral BID  . multivitamin  1 tablet Oral QHS  . sodium chloride flush  3 mL Intravenous Q12H  . sodium chloride flush  3 mL Intravenous Q12H  . sodium chloride flush  3 mL Intravenous Q12H   Continuous Infusions: . sodium chloride    . sodium chloride      Principal Problem:   Acute exacerbation of CHF (congestive heart failure) (HCC) Active Problems:   Hypothyroidism   Illiterate   CAD (coronary artery disease)   Non-ST elevation (NSTEMI) myocardial infarction [facility])   Essential hypertension, benign   CKD stage 5 due to type 2 diabetes mellitus (HCC)   Pleural  effusion--Lt > Rt   Acute respiratory failure with hypoxia (HCC)   LOS: 9 days   A & P  HPpEF: Acute on chronic CHF exacerbation. Echocardiogram from 09/30/2020 has demonstrated EF of 55-60% with severe LVH, and a small pericardial effusion without significant valvular abnormalities and no significant wall motion abnormalities. No sign of tamponade. Moderate AV regurgitation. Moderate aortic stenosis. The patient is currently 13.887 L negative in terms of fluid balance.   CAD: LHC performed on 10/04/2020.  It demonstrated an occluded RCA with grade 3 left to right collaterals. There was also a 50% mid LAD lesion. LVEDP was only mildly elevated at 19. They feel that his elevated troponins on admission were due to demand ischemia due to volume overload and collateral insufficiency. They have recommended medical therapy.   Pleural effusion Lt greater than Rt: 1L cloudy amber fluid was removed from the patient's left pleural space. It appears to be transudative in etiology. The studies were not able to perform microbiological studies on the fluid.   Acute hypoxic respiratory failure:  Due to pulmonary edema and left pleural effusion. This afternoon the patient is saturating 90% on room air. The patient required 10L by HFNC on admission. He is currently saturating at 93% on 2L by nasal cannula.  NSTEMI:  Cardiology feels that his elevated troponins on admission were due to demand ischemia due to volume overload and collateral insufficiency.  CKD V. Now on HD: The patient has had placement of a left arm first stage basilic AV fistula created and the insertion of a right internal jugular tunneled dialysis catheter.  Dialysis Navigator has set the patient up for outpatient dialysis on a TTS schedule.  Hypothyroidism: Continue Synthroid as at home.   PTH I: elevated due to renal insufficiency.  Essential Hypertension: Patient is normotensive today on amlodipine, hydralazine, and metoprolol.  Debility:  PT/OT have recommended SNF placement as this patient is too weak even to move from bed to chair without 2+ assistance. TOC is seeking placement.  I have seen and examined this patient myself. I have spent 30 minutes in his evaluation and care.  DVT Prophylaxis: Heparin SQ CODE STATUS: Full Code Family Communication: None available Disposition: Status is: Inpatient  Remains inpatient appropriate because: lack of safe discharge   Dispo: The patient is from: Home              Anticipated d/c is to: SNF              Anticipated d/c date is: 1 day              Patient currently is currently stable for discharge, but does not have a safe discharge.  Dashanae Longfield, DO [facility] Direct contact: see [email or web address]  7PM-7AM contact night coverage as above 10/09/2020, 2:00 PM  LOS: 6 days

## 2020-10-09 NOTE — TOC Progression Note (Signed)
Transition of Care [facility]) - Progression Note    Patient Details  Name: Vinod Mikesell MRN: [id number] Date of Birth: 1941-05-04  Transition [facility]) CM/SW [place], [place] Phone Number: 10/09/2020, 10:18 AM  Clinical Narrative:    CSW spoke with pt's spouse by phone concerning SNF choice.  Pt's spouse deferred CSW to speak with daughter Mardene Celeste.  CSW gave daughter Mardene Celeste SNF choices along with medicare.gov information.  Family prefers [facility] which is pending or [facility].  Daughter is aware pt may discharge on Monday.  CSW will continue to assist for discharging planning.   Expected Discharge Plan: Skilled Nursing Facility Barriers to Discharge: Ship broker, Continued Medical Work up, SNF Pending bed offer  Expected Discharge Plan and Services Expected Discharge Plan: [place] In-house Referral: Clinical Social Work     Living arrangements for the past 2 months: Single Family Home                                       Social Determinants of Health (SDOH) Interventions    Readmission Risk Interventions No flowsheet data found.

## 2020-10-09 NOTE — Progress Notes (Signed)
[facility] Progress Note  Name: Michael Trevino MRN: [id number] DOB: 1941/01/08  Subjective:  Says he feels fine today. No complaints. Planning for diaylsis  Review of systems:    12 systems reviewed and negative except per HPI   Intake/Output Summary (Last 24 hours) at 10/09/2020 0917 Last data filed at 10/09/2020 0700 Gross per 24 hour  Intake 246 ml  Output --  Net 246 ml    Vitals:  Vitals:   10/08/20 2328 10/09/20 0316 10/09/20 0412 10/09/20 0702  BP: (!) 137/45 (!) 128/46  (!) 135/45  Pulse: 68 69  68  Resp: 13 17  20   Temp: 97.6 F (36.4 C) 97.7 F (36.5 C)  98.3 F (36.8 C)  TempSrc: Oral Oral  Oral  SpO2: 96% 94%  94%  Weight:   98.3 kg   Height:         Physical Exam:  GEN: Elderly, sitting in bed, nad ENT: no nasal discharge, mmm EYES: no scleral icterus, eomi CV: normal rate, no rub PULM: no iwob, bilateral chest rise, decreased breath sounds bilaterally ABD: NABS, non-distended SKIN: no rashes or jaundice EXT: no edema, warm and well perfused Access: RIJ TDC, LUE AVF    Scheduled Meds: . sodium chloride   Intravenous Once  . sodium chloride   Intravenous Once  . amLODipine  10 mg Oral Daily  . ascorbic acid  500 mg Oral Daily  . aspirin  81 mg Oral Daily  . atorvastatin  80 mg Oral Daily  . calcitRIOL  0.5 mcg Oral Daily  . Chlorhexidine Gluconate Cloth  6 each Topical Daily  . cholecalciferol  1,000 Units Oral Daily  . feeding supplement (NEPRO CARB STEADY)  237 mL Oral BID BM  . heparin  5,000 Units Subcutaneous Q8H  . hydrALAZINE  50 mg Oral TID  . insulin aspart  0-5 Units Subcutaneous QHS  . insulin aspart  0-9 Units Subcutaneous TID WC  . insulin detemir  25 Units Subcutaneous QHS  . levothyroxine  175 mcg Oral QAC breakfast  . metoprolol tartrate  100 mg Oral BID  . multivitamin  1 tablet Oral QHS  . sodium chloride flush  3 mL Intravenous Q12H  . sodium chloride flush  3 mL Intravenous Q12H  . sodium chloride  flush  3 mL Intravenous Q12H   Continuous Infusions: . sodium chloride    . sodium chloride     PRN Meds:.sodium chloride, sodium chloride, acetaminophen, bisacodyl, chlorproMAZINE, lidocaine, morphine injection, nitroGLYCERIN, ondansetron (ZOFRAN) IV, ondansetron **OR** [DISCONTINUED] ondansetron (ZOFRAN) IV, oxyCODONE-acetaminophen, polyethylene glycol, sodium chloride flush, sodium chloride flush, traZODone   Labs:  BMP Latest Ref Rng & Units 10/09/2020 10/08/2020 10/07/2020  Glucose 70 - 99 mg/dL 102(H) 121(H) 98  BUN 8 - 23 mg/dL 68(H) 51(H) 28(H)  Creatinine 0.61 - 1.24 mg/dL 8.45(H) 7.36(H) 4.47(H)  BUN/Creat Ratio 10 - 24 - - -  Sodium 135 - 145 mmol/L 134(L) 134(L) 137  Potassium 3.5 - 5.1 mmol/L 4.0 3.5 3.8  Chloride 98 - 111 mmol/L 97(L) 95(L) 100  CO2 22 - 32 mmol/L 24 23 27   Calcium 8.9 - 10.3 mg/dL 7.4(L) 7.6(L) 7.7(L)     Assessment/Plan:    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  s/p RIJ tunn catheter and LUE AVF on 11/23 with vascular.    TTS schedule  He has an outpatient unit [facility] and can start there after DC  Acute hypoxic respiratory failure with large left pleural effusion.  Improving with UF with HD.   Left Pleural effusion - s/p left thoracentesis with 1 L of blood-tinged tinged fluid removed. Per primary team  NSTEMI - presented with chest pain transferred from urgent care for cardiology plan for cardiac catheterization on 11/22 per charting.  Medical management of chronic RCA occlusion per cardiology note.   Hypertension - UF as tolerated with HD.  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 No binder needed  Anemia CKD - s/p aranesp 40 mcg once on 11/23 and it appears again on 11/24 - moved per pharmacy.  Iron saturations 11%. Had Nulecit on 11/24.  Had 2 units PRBC's on 11/24  Disposition per primary team.  He has been recommended for SNF placement.  Note patient been  accepted to an outpatient unit for starting there as early as 11/27.  [facility] with 10:45 chair time.    When he is discharged (if he goes over the weekend), nephrology will need to call Michael Trevino to notify them and to fax them the discharge summary.  Michael Trevino phone:  [phone] and Fax: [phone]  Michael Chew, MD 10/09/2020 9:17 AM

## 2020-10-10 DIAGNOSIS — I5033 Acute on chronic diastolic (congestive) heart failure: Secondary | ICD-10-CM | POA: Diagnosis not present

## 2020-10-10 DIAGNOSIS — I2510 Atherosclerotic heart disease of native coronary artery without angina pectoris: Secondary | ICD-10-CM | POA: Diagnosis not present

## 2020-10-10 DIAGNOSIS — I10 Essential (primary) hypertension: Secondary | ICD-10-CM | POA: Diagnosis not present

## 2020-10-10 DIAGNOSIS — J9601 Acute respiratory failure with hypoxia: Secondary | ICD-10-CM | POA: Diagnosis not present

## 2020-10-10 LAB — RENAL FUNCTION PANEL
Albumin: 2.2 g/dL — ABNORMAL LOW (ref 3.5–5.0)
Anion gap: 12 (ref 5–15)
BUN: 38 mg/dL — ABNORMAL HIGH (ref 8–23)
CO2: 26 mmol/L (ref 22–32)
Calcium: 7.8 mg/dL — ABNORMAL LOW (ref 8.9–10.3)
Chloride: 97 mmol/L — ABNORMAL LOW (ref 98–111)
Creatinine, Ser: 5.88 mg/dL — ABNORMAL HIGH (ref 0.61–1.24)
GFR, Estimated: 9 mL/min — ABNORMAL LOW (ref >60)
Glucose, Bld: 129 mg/dL — ABNORMAL HIGH (ref 70–99)
Phosphorus: 3.8 mg/dL (ref 2.5–4.6)
Potassium: 3.5 mmol/L (ref 3.5–5.1)
Sodium: 135 mmol/L (ref 135–145)

## 2020-10-10 LAB — GLUCOSE, CAPILLARY
Glucose-Capillary: 89 mg/dL (ref 70–99)
Glucose-Capillary: 165 mg/dL — ABNORMAL HIGH (ref 70–99)
Glucose-Capillary: 112 mg/dL — ABNORMAL HIGH (ref 70–99)
Glucose-Capillary: 133 mg/dL — ABNORMAL HIGH (ref 70–99)

## 2020-10-10 NOTE — Plan of Care (Signed)

## 2020-10-10 NOTE — Progress Notes (Signed)
PROGRESS NOTE  Lorimer Tiberio XAJ:[id number] DOB: 1941/06/10 DOA: 09/30/2020 PCP: Dettinger, Fransisca Kaufmann, MD  Brief History   [name] a79 y.o.malewith past medical history relevant for HTN, DM2, CKD 5 with nephrotic syndrome,, hypothyroidism, CAD with prior MI, and HLD and obesity who initially presented on 09/30/2020 to the surgical unit for creation of a left arm AV fistula by vascular surgeon Dr. Cherylynn Ridges patient complained of some chest discomfort and shortness of breath, so hisprocedure was canceled,and patient was sent to the ED for work-up for his shortness of breath.  Upon presentation the patient was very short of breath and hypoxic required continuous BiPAP initially,also requiring IV nitro drip for elevated BP and CHF flare-up.  received IV Lasix 80 mg x 2 with very limited output. Chest x-ray in the ED showed bilateral pleural effusion left more than right. He underwent successful ultrasound-guided thoracentesis on 09/30/2020 with removal of 1 L from the left pleural space. The results are consistent with a transudative effusion. After the thoracentesis patient was eventually able to come off BiPAP,and transitioned to nasal cannula. Echo performed on 09/47/0962 with diastolic dysfunction, The patient denied fever, chills, productive cough, nausea, vomiting, or diarrhea. No pleuritic chest pain. He does have lower extremity edema. Creatinine was elevated at 5.5 with acidosis and hyperuricemia. His hemoglobin is close to baseline.  LFTs are not elevated. BNP is elevated at 1022 and chest x-ray consistent with CHF with effusions. Troponin is 194 in the setting of renal failure.  Echocardiogram from 09/30/2020 has demonstrated EF of 55-60% with severe LVH, and a small pericardial effusion without significant valvular abnormalities and no significant wall motion abnormalities. No sign of tamponade. Moderate AV regurgitation. Moderate aortic stenosis.   He was started on a  heparin drip by cardiology. They performed LHC on 10/04/2020 after HD was initiated. It demonstrated an occluded RCA with grade 3 left to right collaterals. There was also a 50% mid LAD lesion. LVEDP was only mildly elevated at 19. They feel that his elevated troponins on admission were due to demand ischemia due to volume overload and collateral insufficiency. They have recommended medical therapy.   The patient has had placement of a left arm first stage basilic AV fistula created and the insertion of a right internal jugular tunneled dialysis catheter.   Dialysis navigator has set the patient up for outpatient dialysis on a TTS schedule.   The patient has been evaluated by PT/OT and they have recommended SNF. TOC has been consulted and they have begun the process of finding placement for the patient.  Consultants  . Nephrology . Cardiology . Vascular surgery . Interventional Radiology  Procedures  . Thoracentesis . Placement of AV fistula . Placement of tunneled temporary dialysis catheter . Initiation of HD . Left Heart catheterization.  Antibiotics   Anti-infectives (From admission, onward)   Start     Dose/Rate Route Frequency Ordered Stop   10/05/20 1307  ceFAZolin (ANCEF) 2-4 GM/100ML-% IVPB       Note to Pharmacy: [name]   : cabinet override      10/05/20 1307 10/06/20 0114     Interval History/Subjective  The patient is resting comfortably. No new complaints.  Objective   Vitals:  Vitals:   10/10/20 0802 10/10/20 1114  BP:  (!) 121/45  Pulse:  65  Resp:  14  Temp:  98.4 F (36.9 C)  SpO2: 95% 97%   Exam:  Constitutional:  . The patient is awake, alert, and oriented x 3.  No acute distress. Respiratory:  . No increased work of breathing. . No wheezes, rales, or rhonchi . No tactile fremitus Cardiovascular:  . Regular rate and rhythm . No murmurs, ectopy, or gallups. . No lateral PMI. No thrills. Abdomen:  . Abdomen is soft, non-tender,  non-distended . No hernias, masses, or organomegaly . Normoactive bowel sounds.  Musculoskeletal:  . No cyanosis, clubbing, or edema Skin:  . No rashes, lesions, ulcers . palpation of skin: no induration or nodules Neurologic:  . CN 2-12 intact . Sensation all 4 extremities intact Psychiatric:  . Mental status o Mood, affect appropriate o Orientation to person, place, time  . judgment and insight appear intact  I have personally reviewed the following:   Today's Data  . Vitals, BMP  Micro Data  . 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 CXR  Cardiology Data  . Echocardiogram . Left Heart Catheterization 10/04/2020  Scheduled Meds: . sodium chloride   Intravenous Once  . sodium chloride   Intravenous Once  . amLODipine  10 mg Oral Daily  . ascorbic acid  500 mg Oral Daily  . aspirin  81 mg Oral Daily  . atorvastatin  80 mg Oral Daily  . calcitRIOL  0.5 mcg Oral Daily  . Chlorhexidine Gluconate Cloth  6 each Topical Daily  . cholecalciferol  1,000 Units Oral Daily  . feeding supplement (NEPRO CARB STEADY)  237 mL Oral BID BM  . heparin  5,000 Units Subcutaneous Q8H  . hydrALAZINE  50 mg Oral TID  . insulin aspart  0-5 Units Subcutaneous QHS  . insulin aspart  0-9 Units Subcutaneous TID WC  . insulin detemir  25 Units Subcutaneous QHS  . levothyroxine  175 mcg Oral QAC breakfast  . metoprolol tartrate  100 mg Oral BID  . multivitamin  1 tablet Oral QHS  . sodium chloride flush  3 mL Intravenous Q12H  . sodium chloride flush  3 mL Intravenous Q12H  . sodium chloride flush  3 mL Intravenous Q12H   Continuous Infusions: . sodium chloride    . sodium chloride      Principal Problem:   Acute exacerbation of CHF (congestive heart failure) (HCC) Active Problems:   Hypothyroidism   Illiterate   CAD (coronary artery disease)   Non-ST elevation (NSTEMI) myocardial infarction [facility])   Essential hypertension, benign   CKD stage 5 due to type 2 diabetes mellitus  (HCC)   Pleural effusion--Lt > Rt   Acute respiratory failure with hypoxia (HCC)   LOS: 10 days   A & P  HPpEF: Acute on chronic CHF exacerbation. Echocardiogram from 09/30/2020 has demonstrated EF of 55-60% with severe LVH, and a small pericardial effusion without significant valvular abnormalities and no significant wall motion abnormalities. No sign of tamponade. Moderate AV regurgitation. Moderate aortic stenosis. The patient is currently 15.637 L negative in terms of fluid balance.   CAD: LHC performed on 10/04/2020.  It demonstrated an occluded RCA with grade 3 left to right collaterals. There was also a 50% mid LAD lesion. LVEDP was only mildly elevated at 19. They feel that his elevated troponins on admission were due to demand ischemia due to volume overload and collateral insufficiency. They have recommended medical therapy.   Pleural effusion Lt greater than Rt: 1L cloudy amber fluid was removed from the patient's left pleural space. It appears to be transudative in etiology. The studies were not able to perform microbiological studies on the fluid.   Acute hypoxic  respiratory failure: Due to pulmonary edema and left pleural effusion. This afternoon the patient is saturating 90% on room air. The patient required 10L by HFNC on admission. He is currently saturating at 97% on 1L by nasal cannula.  NSTEMI:  Cardiology feels that his elevated troponins on admission were due to demand ischemia due to volume overload and collateral insufficiency.  CKD V. Now on HD: The patient has had placement of a left arm first stage basilic AV fistula created and the insertion of a right internal jugular tunneled dialysis catheter.  Dialysis Navigator has set the patient up for outpatient dialysis on a TTS schedule.  Hypothyroidism: Continue Synthroid as at home.   PTH I: elevated due to renal insufficiency.  Essential Hypertension: Patient is normotensive today on amlodipine, hydralazine, and  metoprolol.  Debility: PT/OT have recommended SNF placement as this patient is too weak even to move from bed to chair without 2+ assistance. TOC is seeking placement.  I have seen and examined this patient myself. I have spent 28 minutes in his evaluation and care.  DVT Prophylaxis: Heparin SQ CODE STATUS: Full Code Family Communication: None available Disposition: Status is: Inpatient  Remains inpatient appropriate because: lack of safe discharge   Dispo: The patient is from: Home              Anticipated d/c is to: SNF              Anticipated d/c date is: 1 day              Patient currently is currently stable for discharge, but does not have a safe discharge.  Tyshea Imel, DO [facility] Direct contact: see [email or web address]  7PM-7AM contact night coverage as above 10/10/2020, 12:42 PM  LOS: 6 days

## 2020-10-10 NOTE — Progress Notes (Signed)
[facility] Progress Note  Name: Dawood Spitler MRN: [id number] DOB: 01-25-1941  Subjective:  Patient tolerated dialysis yesterday.  2.6 L ultrafiltration.  No complaints today.  Awaiting SNF placement  Review of systems:    12 systems reviewed and negative except per HPI   Intake/Output Summary (Last 24 hours) at 10/10/2020 0850 Last data filed at 10/10/2020 0800 Gross per 24 hour  Intake 720 ml  Output 2950 ml  Net -2230 ml    Vitals:  Vitals:   10/10/20 0419 10/10/20 0500 10/10/20 0800 10/10/20 0802  BP: (!) 128/48  (!) 119/45   Pulse: 71  74   Resp: 20  19   Temp: 98 F (36.7 C)  98.2 F (36.8 C)   TempSrc: Oral  Oral   SpO2: 92%  95% 95%  Weight:  94.7 kg    Height:         Physical Exam:  GEN: Elderly, sitting in bed, nad ENT: no nasal discharge, mmm EYES: no scleral icterus, eomi CV: normal rate, no rub PULM: no iwob, bilateral chest rise, decreased breath sounds bilaterally ABD: NABS, non-distended SKIN: no rashes or jaundice EXT: no edema, warm and well perfused Access: RIJ TDC, LUE AVF    Scheduled Meds: . sodium chloride   Intravenous Once  . sodium chloride   Intravenous Once  . amLODipine  10 mg Oral Daily  . ascorbic acid  500 mg Oral Daily  . aspirin  81 mg Oral Daily  . atorvastatin  80 mg Oral Daily  . calcitRIOL  0.5 mcg Oral Daily  . Chlorhexidine Gluconate Cloth  6 each Topical Daily  . cholecalciferol  1,000 Units Oral Daily  . feeding supplement (NEPRO CARB STEADY)  237 mL Oral BID BM  . heparin  5,000 Units Subcutaneous Q8H  . hydrALAZINE  50 mg Oral TID  . insulin aspart  0-5 Units Subcutaneous QHS  . insulin aspart  0-9 Units Subcutaneous TID WC  . insulin detemir  25 Units Subcutaneous QHS  . levothyroxine  175 mcg Oral QAC breakfast  . metoprolol tartrate  100 mg Oral BID  . multivitamin  1 tablet Oral QHS  . sodium chloride flush  3 mL Intravenous Q12H  . sodium chloride flush  3 mL Intravenous Q12H  .  sodium chloride flush  3 mL Intravenous Q12H   Continuous Infusions: . sodium chloride    . sodium chloride     PRN Meds:.sodium chloride, sodium chloride, acetaminophen, bisacodyl, chlorproMAZINE, lidocaine, morphine injection, nitroGLYCERIN, ondansetron (ZOFRAN) IV, ondansetron **OR** [DISCONTINUED] ondansetron (ZOFRAN) IV, oxyCODONE-acetaminophen, polyethylene glycol, sodium chloride flush, sodium chloride flush, traZODone   Labs:  BMP Latest Ref Rng & Units 10/10/2020 10/09/2020 10/08/2020  Glucose 70 - 99 mg/dL 129(H) 102(H) 121(H)  BUN 8 - 23 mg/dL 38(H) 68(H) 51(H)  Creatinine 0.61 - 1.24 mg/dL 5.88(H) 8.45(H) 7.36(H)  BUN/Creat Ratio 10 - 24 - - -  Sodium 135 - 145 mmol/L 135 134(L) 134(L)  Potassium 3.5 - 5.1 mmol/L 3.5 4.0 3.5  Chloride 98 - 111 mmol/L 97(L) 97(L) 95(L)  CO2 22 - 32 mmol/L 26 24 23   Calcium 8.9 - 10.3 mg/dL 7.8(L) 7.4(L) 7.6(L)     Assessment/Plan:    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  s/p RIJ tunn catheter and LUE AVF on 11/23 with vascular.    TTS schedule, tolerated dialysis yesterday with no issues, minimal urine output  He has an outpatient unit - [facility] and can start there after  DC  Acute hypoxic respiratory failure with large left pleural effusion.  Improving with UF with HD.   Left Pleural effusion - s/p left thoracentesis with 1 L of blood-tinged tinged fluid removed. Per primary team  NSTEMI - presented with chest pain transferred from urgent care for cardiology plan for cardiac catheterization on 11/22 per charting.  Medical management of chronic RCA occlusion per cardiology note.   Hypertension - UF as tolerated with HD.  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 No binder needed  Anemia CKD - s/p aranesp 40 mcg once on 11/23 and it appears again on 11/24 - moved per pharmacy.  Iron saturations 11%. Had Nulecit on 11/24.  Had 2 units PRBC's on  11/24  Disposition per primary team.  He has been recommended for SNF placement.  Note patient been accepted to an outpatient unit for starting there as early as 11/27.  [facility] with 10:45 chair time.      Reesa Chew, MD 10/10/2020 8:50 AM

## 2020-10-11 ENCOUNTER — Ambulatory Visit (HOSPITAL_COMMUNITY): Admission: RE | Admit: 2020-10-11 | Payer: Medicare Other | Source: Ambulatory Visit

## 2020-10-11 DIAGNOSIS — I2510 Atherosclerotic heart disease of native coronary artery without angina pectoris: Secondary | ICD-10-CM | POA: Diagnosis not present

## 2020-10-11 DIAGNOSIS — I10 Essential (primary) hypertension: Secondary | ICD-10-CM | POA: Diagnosis not present

## 2020-10-11 DIAGNOSIS — I5033 Acute on chronic diastolic (congestive) heart failure: Secondary | ICD-10-CM | POA: Diagnosis not present

## 2020-10-11 DIAGNOSIS — J9601 Acute respiratory failure with hypoxia: Secondary | ICD-10-CM | POA: Diagnosis not present

## 2020-10-11 LAB — GLUCOSE, CAPILLARY
Glucose-Capillary: 116 mg/dL — ABNORMAL HIGH (ref 70–99)
Glucose-Capillary: 75 mg/dL (ref 70–99)
Glucose-Capillary: 179 mg/dL — ABNORMAL HIGH (ref 70–99)
Glucose-Capillary: 137 mg/dL — ABNORMAL HIGH (ref 70–99)
Glucose-Capillary: 144 mg/dL — ABNORMAL HIGH (ref 70–99)
Glucose-Capillary: 191 mg/dL — ABNORMAL HIGH (ref 70–99)

## 2020-10-11 LAB — CBC WITH DIFFERENTIAL/PLATELET
Abs Immature Granulocytes: 0.06 10*3/uL (ref 0.00–0.07)
Basophils Absolute: 0 10*3/uL (ref 0.0–0.1)
Basophils Relative: 0 %
Eosinophils Absolute: 0.1 10*3/uL (ref 0.0–0.5)
Eosinophils Relative: 1 %
HCT: 26.7 % — ABNORMAL LOW (ref 39.0–52.0)
Hemoglobin: 8.8 g/dL — ABNORMAL LOW (ref 13.0–17.0)
Immature Granulocytes: 1 %
Lymphocytes Relative: 30 %
Lymphs Abs: 2 10*3/uL (ref 0.7–4.0)
MCH: 29.4 pg (ref 26.0–34.0)
MCHC: 33 g/dL (ref 30.0–36.0)
MCV: 89.3 fL (ref 80.0–100.0)
Monocytes Absolute: 1.1 10*3/uL — ABNORMAL HIGH (ref 0.1–1.0)
Monocytes Relative: 15 %
Neutro Abs: 3.6 10*3/uL (ref 1.7–7.7)
Neutrophils Relative %: 53 %
Platelets: 210 10*3/uL (ref 150–400)
RBC: 2.99 MIL/uL — ABNORMAL LOW (ref 4.22–5.81)
RDW: 13.4 % (ref 11.5–15.5)
WBC: 6.9 10*3/uL (ref 4.0–10.5)
nRBC: 0.3 % — ABNORMAL HIGH (ref 0.0–0.2)

## 2020-10-11 LAB — RENAL FUNCTION PANEL
Albumin: 2.2 g/dL — ABNORMAL LOW (ref 3.5–5.0)
Anion gap: 15 (ref 5–15)
BUN: 58 mg/dL — ABNORMAL HIGH (ref 8–23)
CO2: 22 mmol/L (ref 22–32)
Calcium: 7.7 mg/dL — ABNORMAL LOW (ref 8.9–10.3)
Chloride: 98 mmol/L (ref 98–111)
Creatinine, Ser: 7.55 mg/dL — ABNORMAL HIGH (ref 0.61–1.24)
GFR, Estimated: 7 mL/min — ABNORMAL LOW (ref >60)
Glucose, Bld: 95 mg/dL (ref 70–99)
Phosphorus: 4.6 mg/dL (ref 2.5–4.6)
Potassium: 3.5 mmol/L (ref 3.5–5.1)
Sodium: 135 mmol/L (ref 135–145)

## 2020-10-11 NOTE — Progress Notes (Signed)
Physical Therapy Treatment Patient Details Name: Michael Trevino MRN: [id number] DOB: 02-01-1941 Today's Date: 10/11/2020    History of Present Illness  Michael Trevino  is a 79 y.o. male with PMH relevant for HTN, DM2, CKD 5 with nephrotic syndrome,hypothyroidism, CAD with prior MI, and HLD and obesity who initially presented on 09/30/2020 to the surgical unit for creation of a left arm AV fistula, however patient complained of some chest discomfort and shortness of breath, so his procedure was canceled, and patient was sent to the ED for work-up for his shortness of breath. Admitted with HFpEF. S/p cardiac cath and placement of RIJ tunneled dialysis catheter.    PT Comments    Pt seated in recliner but very fatigued on arrival.  He required +2 assistance this session due to B knee buckling.  Pt did perform gt but significantly weaker than he presented in previous session. Continues to recommend rehab in a post acute setting.      Follow Up Recommendations  SNF     Equipment Recommendations  3in1 (PT);Wheelchair (measurements PT);Wheelchair cushion (measurements PT)    Recommendations for Other Services       Precautions / Restrictions Precautions Precautions: Fall Restrictions Weight Bearing Restrictions: No    Mobility  Bed Mobility Overal bed mobility: Needs Assistance Bed Mobility: Rolling;Sit to Sidelying Rolling: Min assist       Sit to sidelying: Mod assist General bed mobility comments: Pt returned to sidelying and rolled into supine.  He appears pale and reports dizziness after returning to bed.  147/42.  Transfers Overall transfer level: Needs assistance Equipment used: Rolling walker (2 wheeled) Transfers: Sit to/from Stand Sit to Stand: Mod assist;+2 physical assistance (heavy mod assistance to rise into standing, performed additional trial with mod +2.)         General transfer comment: Cues for hand placement to push from seated surface this session. Pt with  poor quad control this session.  Ambulation/Gait Ambulation/Gait assistance: Max assist;+2 physical assistance Gait Distance (Feet): 4 Feet (4 ft max +1, required +2 assistance for 6 ft trial.) Assistive device: Rolling walker (2 wheeled) Gait Pattern/deviations: Step-to pattern;Trunk flexed;Shuffle     General Gait Details: Poor quad control during gt training.  Pt required cues for head, hip and trunk control.  Pt with B buckling and required +3 assistance for safe gt due to B knee buckling.   Stairs             Wheelchair Mobility    Modified Rankin (Stroke Patients Only)       Balance Overall balance assessment: Needs assistance Sitting-balance support: Feet supported;No upper extremity supported Sitting balance-Leahy Scale: Fair       Standing balance-Leahy Scale: Poor Standing balance comment: reliant on external support and RW and B knees buckling.                            Cognition Arousal/Alertness: Awake/alert Behavior During Therapy: Flat affect Overall Cognitive Status: Impaired/Different from baseline Area of Impairment: Orientation;Safety/judgement;Awareness;Problem solving                 Orientation Level: Disoriented to;Time;Situation       Safety/Judgement: Decreased awareness of safety;Decreased awareness of deficits Awareness: Intellectual Problem Solving: Difficulty sequencing;Requires verbal cues;Decreased initiation General Comments: Pt with flat affect this session.  He had difficulty following commands and presents with weakness.      Exercises General Exercises - Lower Extremity Quad Sets: AROM;Both;10  reps;Supine Heel Slides: AAROM;10 reps;Supine    General Comments        Pertinent Vitals/Pain Pain Assessment: No/denies pain Faces Pain Scale: Hurts a little bit Pain Location: RUE (catheter site) Pain Descriptors / Indicators: Guarding Pain Intervention(s): Monitored during session;Repositioned     Home Living                      Prior Function            PT Goals (current goals can now be found in the care plan section) Acute Rehab PT Goals Potential to Achieve Goals: Fair Progress towards PT goals: Progressing toward goals    Frequency    Min 3X/week      PT Plan Current plan remains appropriate    Co-evaluation              AM-PAC PT "6 Clicks" Mobility   Outcome Measure  Help needed turning from your back to your side while in a flat bed without using bedrails?: A Little Help needed moving from lying on your back to sitting on the side of a flat bed without using bedrails?: A Little Help needed moving to and from a bed to a chair (including a wheelchair)?: Total Help needed standing up from a chair using your arms (e.g., wheelchair or bedside chair)?: Total Help needed to walk in hospital room?: Total Help needed climbing 3-5 steps with a railing? : Total 6 Click Score: 10    End of Session Equipment Utilized During Treatment: Gait belt Activity Tolerance: Patient tolerated treatment well Patient left: with call bell/phone within reach;Other (comment);in chair;with chair alarm set Nurse Communication: Mobility status PT Visit Diagnosis: Unsteadiness on feet (R26.81);Muscle weakness (generalized) (M62.81);Difficulty in walking, not elsewhere classified (R26.2)     Time: 8546-2703 PT Time Calculation (min) (ACUTE ONLY): 28 min  Charges:  $Gait Training: 8-22 mins $Therapeutic Exercise: 8-22 mins                     Michael Trevino , PTA Acute Rehabilitation Services Pager [phone] Office [phone]     Michael Trevino Michael Trevino 10/11/2020, 3:07 PM

## 2020-10-11 NOTE — Progress Notes (Signed)
PROGRESS NOTE  Michael Trevino WGN:[id number] DOB: 07-15-1941 DOA: 09/30/2020 PCP: Dettinger, Fransisca Kaufmann, MD  Brief History   [name] a79 y.o.malewith past medical history relevant for HTN, DM2, CKD 5 with nephrotic syndrome,, hypothyroidism, CAD with prior MI, and HLD and obesity who initially presented on 09/30/2020 to the surgical unit for creation of a left arm AV fistula by vascular surgeon Dr. Cherylynn Ridges patient complained of some chest discomfort and shortness of breath, so hisprocedure was canceled,and patient was sent to the ED for work-up for his shortness of breath.  Upon presentation the patient was very short of breath and hypoxic required continuous BiPAP initially,also requiring IV nitro drip for elevated BP and CHF flare-up.  received IV Lasix 80 mg x 2 with very limited output. Chest x-ray in the ED showed bilateral pleural effusion left more than right. He underwent successful ultrasound-guided thoracentesis on 09/30/2020 with removal of 1 L from the left pleural space. The results are consistent with a transudative effusion. After the thoracentesis patient was eventually able to come off BiPAP,and transitioned to nasal cannula. Echo performed on 78/46/9629 with diastolic dysfunction, The patient denied fever, chills, productive cough, nausea, vomiting, or diarrhea. No pleuritic chest pain. He does have lower extremity edema. Creatinine was elevated at 5.5 with acidosis and hyperuricemia. His hemoglobin is close to baseline.  LFTs are not elevated. BNP is elevated at 1022 and chest x-ray consistent with CHF with effusions. Troponin is 194 in the setting of renal failure.  Echocardiogram from 09/30/2020 has demonstrated EF of 55-60% with severe LVH, and a small pericardial effusion without significant valvular abnormalities and no significant wall motion abnormalities. No sign of tamponade. Moderate AV regurgitation. Moderate aortic stenosis.   He was started on a  heparin drip by cardiology. They performed LHC on 10/04/2020 after HD was initiated. It demonstrated an occluded RCA with grade 3 left to right collaterals. There was also a 50% mid LAD lesion. LVEDP was only mildly elevated at 19. They feel that his elevated troponins on admission were due to demand ischemia due to volume overload and collateral insufficiency. They have recommended medical therapy.   The patient has had placement of a left arm first stage basilic AV fistula created and the insertion of a right internal jugular tunneled dialysis catheter.   Dialysis navigator has set the patient up for outpatient dialysis on a TTS schedule.   The patient has been evaluated by PT/OT and they have recommended SNF. TOC has been consulted and they have begun the process of finding placement for the patient.  Consultants  . Nephrology . Cardiology . Vascular surgery . Interventional Radiology  Procedures  . Thoracentesis . Placement of AV fistula . Placement of tunneled temporary dialysis catheter . Initiation of HD . Left Heart catheterization.  Antibiotics   Anti-infectives (From admission, onward)   Start     Dose/Rate Route Frequency Ordered Stop   10/05/20 1307  ceFAZolin (ANCEF) 2-4 GM/100ML-% IVPB       Note to Pharmacy: [name]   : cabinet override      10/05/20 1307 10/06/20 0114     Interval History/Subjective  The patient is resting comfortably. No new complaints.  Objective   Vitals:  Vitals:   10/11/20 0800 10/11/20 1121  BP: (!) 139/42 (!) 143/84  Pulse: 69 75  Resp: 15 (!) 27  Temp: 98.2 F (36.8 C) 97.6 F (36.4 C)  SpO2: 94% 95%   Exam:  Constitutional:  . The patient is awake,  alert, and oriented x 3. No acute distress. Respiratory:  . No increased work of breathing. . No wheezes, rales, or rhonchi . No tactile fremitus Cardiovascular:  . Regular rate and rhythm . No murmurs, ectopy, or gallups. . No lateral PMI. No thrills. Abdomen:   . Abdomen is soft, non-tender, non-distended . No hernias, masses, or organomegaly . Normoactive bowel sounds.  Musculoskeletal:  . No cyanosis, clubbing, or edema Skin:  . No rashes, lesions, ulcers . palpation of skin: no induration or nodules Neurologic:  . CN 2-12 intact . Sensation all 4 extremities intact Psychiatric:  . Mental status o Mood, affect appropriate o Orientation to person, place, time  . judgment and insight appear intact  I have personally reviewed the following:   Today's Data  . Vitals, BMP  Micro Data  . 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 CXR  Cardiology Data  . Echocardiogram . Left Heart Catheterization 10/04/2020  Scheduled Meds: . sodium chloride   Intravenous Once  . sodium chloride   Intravenous Once  . amLODipine  10 mg Oral Daily  . ascorbic acid  500 mg Oral Daily  . aspirin  81 mg Oral Daily  . atorvastatin  80 mg Oral Daily  . calcitRIOL  0.5 mcg Oral Daily  . Chlorhexidine Gluconate Cloth  6 each Topical Daily  . cholecalciferol  1,000 Units Oral Daily  . feeding supplement (NEPRO CARB STEADY)  237 mL Oral BID BM  . heparin  5,000 Units Subcutaneous Q8H  . hydrALAZINE  50 mg Oral TID  . insulin aspart  0-5 Units Subcutaneous QHS  . insulin aspart  0-9 Units Subcutaneous TID WC  . insulin detemir  25 Units Subcutaneous QHS  . levothyroxine  175 mcg Oral QAC breakfast  . metoprolol tartrate  100 mg Oral BID  . multivitamin  1 tablet Oral QHS  . sodium chloride flush  3 mL Intravenous Q12H  . sodium chloride flush  3 mL Intravenous Q12H  . sodium chloride flush  3 mL Intravenous Q12H   Continuous Infusions: . sodium chloride    . sodium chloride      Principal Problem:   Acute exacerbation of CHF (congestive heart failure) (HCC) Active Problems:   Hypothyroidism   Illiterate   CAD (coronary artery disease)   Non-ST elevation (NSTEMI) myocardial infarction [facility])   Essential hypertension, benign   CKD stage 5  due to type 2 diabetes mellitus (HCC)   Pleural effusion--Lt > Rt   Acute respiratory failure with hypoxia (HCC)   LOS: 11 days   A & P  HPpEF: Acute on chronic CHF exacerbation. Echocardiogram from 09/30/2020 has demonstrated EF of 55-60% with severe LVH, and a small pericardial effusion without significant valvular abnormalities and no significant wall motion abnormalities. No sign of tamponade. Moderate AV regurgitation. Moderate aortic stenosis. The patient is currently 15.637 L negative in terms of fluid balance.   CAD: LHC performed on 10/04/2020.  It demonstrated an occluded RCA with grade 3 left to right collaterals. There was also a 50% mid LAD lesion. LVEDP was only mildly elevated at 19. They feel that his elevated troponins on admission were due to demand ischemia due to volume overload and collateral insufficiency. They have recommended medical therapy.   Pleural effusion Lt greater than Rt: 1L cloudy amber fluid was removed from the patient's left pleural space. It appears to be transudative in etiology. The studies were not able to perform microbiological studies on the  fluid.   Acute hypoxic respiratory failure: Due to pulmonary edema and left pleural effusion. This afternoon the patient is saturating 90% on room air. The patient required 10L by HFNC on admission. He is currently saturating at 97% on 1L by nasal cannula.  NSTEMI:  Cardiology feels that his elevated troponins on admission were due to demand ischemia due to volume overload and collateral insufficiency.  CKD V. Now on HD: The patient has had placement of a left arm first stage basilic AV fistula created and the insertion of a right internal jugular tunneled dialysis catheter.  Dialysis Navigator has set the patient up for outpatient dialysis on a TTS schedule.  Hypothyroidism: Continue Synthroid as at home.   PTH I: elevated due to renal insufficiency.  Essential Hypertension: Patient is normotensive today on  amlodipine, hydralazine, and metoprolol.  Debility: PT/OT have recommended SNF placement as this patient is too weak even to move from bed to chair without 2+ assistance. TOC is seeking placement.  I have seen and examined this patient myself. I have spent 28 minutes in his evaluation and care.  DVT Prophylaxis: Heparin SQ CODE STATUS: Full Code Family Communication: None available Disposition: Status is: Inpatient  Remains inpatient appropriate because: lack of safe discharge  Dispo: The patient is from: Home              Anticipated d/c is to: SNF              Anticipated d/c date is: 1 day              Patient currently is currently stable for discharge, but does not have a safe discharge.  Bridgett Hattabaugh, DO [facility] Direct contact: see [email or web address]  7PM-7AM contact night coverage as above 10/11/2020, 1:38 PM  LOS: 6 days

## 2020-10-11 NOTE — TOC Progression Note (Addendum)
Transition of Care [facility]) - Progression Note    Patient Details  Name: Michael Trevino MRN: [id number] Date of Birth: Oct 27, 1941  Transition of Care [facility]) CM/SW [place], [place] Phone Number: 10/11/2020, 9:09 AM  Clinical Narrative:    Update: [facility] can take pt and has started [place]. CSW reached out to check the status and was told SNF needed updated therapy notes, CSW sent therapy notes in the [place].   CSW reached out to [facility] to see if they could take pt, [facility] declined due to pt needing HD and SNF not having HD transport. CSW reached out to pt's daughter to adv of update and get second choice but had to leave a vm. CSW also reached out to pt's wife, but no answer and no vm is set up. Pt is not managed by [employer], SNF will need to obtain auth which could be a barrier to dc. CSW will continue to follow.    Expected Discharge Plan: Skilled Nursing Facility Barriers to Discharge: Ship broker, Continued Medical Work up, SNF Pending bed offer  Expected Discharge Plan and Services Expected Discharge Plan: [place] In-house Referral: Clinical Social Work     Living arrangements for the past 2 months: Single Family Home                                       Social Determinants of Health (SDOH) Interventions    Readmission Risk Interventions No flowsheet data found.

## 2020-10-11 NOTE — Progress Notes (Addendum)
Patient ID: Imre Vecchione, male   DOB: 1941/11/04, 79 y.o.   MRN: [id number] S: No events overnight. O:BP (!) 143/84 (BP Location: Left Arm)   Pulse 75   Temp 97.6 F (36.4 C) (Oral)   Resp (!) 27   Ht 5\' 9"  (1.753 m)   Wt 96.7 kg   SpO2 95%   BMI 31.48 kg/m   Intake/Output Summary (Last 24 hours) at 10/11/2020 1146 Last data filed at 10/10/2020 1700 Gross per 24 hour  Intake 240 ml  Output --  Net 240 ml   Intake/Output: I/O last 3 completed shifts: In: 720 [P.O.:720] Out: 350 [Urine:350]  Intake/Output this shift:  No intake/output data recorded. Weight change: -4.9 kg Gen: NAD CVS: RRR no rub Resp: cta Abd: +BS, soft, NT/ND Ext: no edema, RIJ TDC, LUE AVF  Recent Labs  Lab 10/05/20 0420 10/05/20 0420 10/05/20 1201 10/06/20 0049 10/07/20 0058 10/08/20 0713 10/09/20 0451 10/10/20 0039 10/11/20 0114  NA 135   < > 137 134* 137 134* 134* 135 135  K 3.9   < > 3.3* 4.2 3.8 3.5 4.0 3.5 3.5  CL 96*   < > 99 98 100 95* 97* 97* 98  CO2 25  --   --  24 27 23 24 26 22   GLUCOSE 85   < > 94 192* 98 121* 102* 129* 95  BUN 46*   < > 21 36* 28* 51* 68* 38* 58*  CREATININE 6.10*   < > 3.40* 5.13* 4.47* 7.36* 8.45* 5.88* 7.55*  ALBUMIN 2.1*  --   --  2.1* 2.2* 2.2* 2.1* 2.2* 2.2*  CALCIUM 7.3*  --   --  7.3* 7.7* 7.6* 7.4* 7.8* 7.7*  PHOS 4.9*  --   --  5.3* 3.0 3.7 4.6 3.8 4.6   < > = values in this interval not displayed.   Liver Function Tests: Recent Labs  Lab 10/09/20 0451 10/10/20 0039 10/11/20 0114  ALBUMIN 2.1* 2.2* 2.2*   No results for input(s): LIPASE, AMYLASE in the last 168 hours. No results for input(s): AMMONIA in the last 168 hours. CBC: Recent Labs  Lab 10/05/20 0420 10/05/20 1201 10/06/20 0049 10/06/20 0049 10/07/20 0058 10/09/20 1344 10/11/20 0114  WBC 6.1  --  8.3   < > 8.0 6.4 6.9  NEUTROABS  --   --   --   --  5.5  --  3.6  HGB 6.9*   < > 8.9*   < > 8.9* 8.2* 8.8*  HCT 21.7*   < > 27.2*   < > 26.5* 25.9* 26.7*  MCV 92.3  --  88.6  --   89.2 92.2 89.3  PLT 144*  --  143*   < > 149* 228 210   < > = values in this interval not displayed.   Cardiac Enzymes: No results for input(s): CKTOTAL, CKMB, CKMBINDEX, TROPONINI in the last 168 hours. CBG: Recent Labs  Lab 10/10/20 1112 10/10/20 1609 10/10/20 2107 10/11/20 0627 10/11/20 1124  GLUCAP 165* 112* 133* 75 179*    Iron Studies: No results for input(s): IRON, TIBC, TRANSFERRIN, FERRITIN in the last 72 hours. Studies/Results: No results found. . sodium chloride   Intravenous Once  . sodium chloride   Intravenous Once  . amLODipine  10 mg Oral Daily  . ascorbic acid  500 mg Oral Daily  . aspirin  81 mg Oral Daily  . atorvastatin  80 mg Oral Daily  . calcitRIOL  0.5 mcg Oral Daily  .  Chlorhexidine Gluconate Cloth  6 each Topical Daily  . cholecalciferol  1,000 Units Oral Daily  . feeding supplement (NEPRO CARB STEADY)  237 mL Oral BID BM  . heparin  5,000 Units Subcutaneous Q8H  . hydrALAZINE  50 mg Oral TID  . insulin aspart  0-5 Units Subcutaneous QHS  . insulin aspart  0-9 Units Subcutaneous TID WC  . insulin detemir  25 Units Subcutaneous QHS  . levothyroxine  175 mcg Oral QAC breakfast  . metoprolol tartrate  100 mg Oral BID  . multivitamin  1 tablet Oral QHS  . sodium chloride flush  3 mL Intravenous Q12H  . sodium chloride flush  3 mL Intravenous Q12H  . sodium chloride flush  3 mL Intravenous Q12H    BMET    Component Value Date/Time   NA 135 10/11/2020 0114   NA 142 07/12/2020 1209   K 3.5 10/11/2020 0114   CL 98 10/11/2020 0114   CO2 22 10/11/2020 0114   GLUCOSE 95 10/11/2020 0114   BUN 58 (H) 10/11/2020 0114   BUN 54 (H) 07/12/2020 1209   CREATININE 7.55 (H) 10/11/2020 0114   CALCIUM 7.7 (L) 10/11/2020 0114   GFRNONAA 7 (L) 10/11/2020 0114   GFRAA 13 (L) 07/12/2020 1209   CBC    Component Value Date/Time   WBC 6.9 10/11/2020 0114   RBC 2.99 (L) 10/11/2020 0114   HGB 8.8 (L) 10/11/2020 0114   HGB 10.1 (L) 07/12/2020 1209   HCT 26.7  (L) 10/11/2020 0114   HCT 30.2 (L) 07/12/2020 1209   PLT 210 10/11/2020 0114   PLT 198 07/12/2020 1209   MCV 89.3 10/11/2020 0114   MCV 87 07/12/2020 1209   MCH 29.4 10/11/2020 0114   MCHC 33.0 10/11/2020 0114   RDW 13.4 10/11/2020 0114   RDW 15.1 07/12/2020 1209   LYMPHSABS 2.0 10/11/2020 0114   LYMPHSABS 2.7 07/12/2020 1209   MONOABS 1.1 (H) 10/11/2020 0114   EOSABS 0.1 10/11/2020 0114   EOSABS 0.1 07/12/2020 1209   BASOSABS 0.0 10/11/2020 0114   BASOSABS 0.0 07/12/2020 1209    Assessment/Plan:  1. 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 1. TTS schedule at [facility] after discharge 2. Plan for HD tomorrow to keep on outpatient schedule.  2. Acute hypoxic respiratory failure with large left pleural effusion- s/p thoracentesis with 1 liter of blood-tinged fluid removed 3. NSTEMI- s/p LHC and chronic RCA occlusion.  Medical management per Cardiology. 4. HTN- stable 5. SHPTH- on calcitriol.  Follow Ca and phos levels.  No binders for now. 6. Anemia of CKD- on aranesp and started on IV iron. 7. Vascular access- LUE AVF placed 10/05/20 by Dr. Scot Dock (unfortunately had a BP cuff on LUE).  RIJ TDC placed 11/23 and place wrist band on left arm to avoid blood draws and BP's in that arm.  8. Disposition- pending SNF placement with HD transportation.  Donetta Potts, MD [employer] [phone]

## 2020-10-11 NOTE — Progress Notes (Signed)
Occupational Therapy Treatment Patient Details Name: Michael Trevino MRN: [id number] DOB: December 14, 1940 Today's Date: 10/11/2020    History of present illness  Michael Trevino  is a 79 y.o. male with PMH relevant for HTN, DM2, CKD 5 with nephrotic syndrome,hypothyroidism, CAD with prior MI, and HLD and obesity who initially presented on 09/30/2020 to the surgical unit for creation of a left arm AV fistula, however patient complained of some chest discomfort and shortness of breath, so his procedure was canceled, and patient was sent to the ED for work-up for his shortness of breath. Admitted with HFpEF. S/p cardiac cath and placement of RIJ tunneled dialysis catheter.   OT comments  Pt pleasant and eager to get OOB. Required moderate assist for bed mobility, min to stand with RW and mod assist to transfer to recliner. Pt with decreased safety awareness and tendency to sit before aligned with sitting surface. Completed self feeding with set up and grooming once in chair. Pt continues to be appropriate for SNF level rehab.    SNF;Supervision/Assistance - 24 hour    Equipment Recommendations       Recommendations for Other Services      Precautions / Restrictions Precautions Precautions: Fall       Mobility Bed Mobility Overal bed mobility: Needs Assistance Bed Mobility: Supine to Sit     Supine to sit: Mod assist     General bed mobility comments: pulled up on therapist's hand to elevate trunk, mod assist for hips to EOB using bed pad  Transfers Overall transfer level: Needs assistance Equipment used: Rolling walker (2 wheeled) Transfers: Sit to/from [facility] Sit to Stand: Min assist;From elevated surface Stand pivot transfers: Mod assist       General transfer comment: assist to rise and steady from elevated bed, pt needing maximum verbal cues for safety, tends to attempt to sit prematurely, decreased control of descent to chair    Balance Overall balance  assessment: Needs assistance   Sitting balance-Leahy Scale: Fair       Standing balance-Leahy Scale: Poor Standing balance comment: reliant on external support and RW                           ADL either performed or assessed with clinical judgement   ADL Overall ADL's : Needs assistance/impaired Eating/Feeding: Set up;Sitting Eating/Feeding Details (indicate cue type and reason): applesauce, juice Grooming: Wash/dry hands;Wash/dry face;Sitting;Set up           Upper Body Dressing : Minimal assistance;Sitting   Lower Body Dressing: Total assistance;Bed level                       Vision       Perception     Praxis      Cognition Arousal/Alertness: Awake/alert Behavior During Therapy: WFL for tasks assessed/performed Overall Cognitive Status: Impaired/Different from baseline Area of Impairment: Orientation;Safety/judgement;Awareness;Problem solving                 Orientation Level: Disoriented to;Time;Place       Safety/Judgement: Decreased awareness of safety;Decreased awareness of deficits Awareness: Intellectual Problem Solving: Difficulty sequencing;Requires verbal cues;Decreased initiation          Exercises     Shoulder Instructions       General Comments      Pertinent Vitals/ Pain       Pain Assessment: No/denies pain  Home Living  Prior Functioning/Environment              Frequency  Min 2X/week        Progress Toward Goals  OT Goals(current goals can now be found in the care plan section)  Progress towards OT goals: Progressing toward goals  Acute Rehab OT Goals Patient Stated Goal: go home OT Goal Formulation: With patient Time For Goal Achievement: 10/20/20 Potential to Achieve Goals: Good  Plan Discharge plan remains appropriate    Co-evaluation                 AM-PAC OT "6 Clicks" Daily Activity     Outcome Measure    Help from another person eating meals?: A Little Help from another person taking care of personal grooming?: A Little Help from another person toileting, which includes using toliet, bedpan, or urinal?: Total Help from another person bathing (including washing, rinsing, drying)?: A Lot Help from another person to put on and taking off regular upper body clothing?: A Lot Help from another person to put on and taking off regular lower body clothing?: Total 6 Click Score: 12    End of Session Equipment Utilized During Treatment: Gait belt;Rolling walker  OT Visit Diagnosis: Unsteadiness on feet (R26.81);Other abnormalities of gait and mobility (R26.89);Muscle weakness (generalized) (M62.81);Other symptoms and signs involving cognitive function   Activity Tolerance Patient tolerated treatment well   Patient Left in chair;with call bell/phone within reach;with chair alarm set   Nurse Communication Other (comment) (ok to give pt apple juice)        Time: 0938-1829 OT Time Calculation (min): 22 min  Charges: OT General Charges $OT Visit: 1 Visit OT Treatments $Self Care/Home Management : 8-22 mins  Nestor Lewandowsky, OTR/L Acute Rehabilitation Services Pager: [phone] Office: [phone]   Malka So 10/11/2020, 10:13 AM

## 2020-10-12 DIAGNOSIS — I2510 Atherosclerotic heart disease of native coronary artery without angina pectoris: Secondary | ICD-10-CM | POA: Diagnosis not present

## 2020-10-12 DIAGNOSIS — J9601 Acute respiratory failure with hypoxia: Secondary | ICD-10-CM | POA: Diagnosis not present

## 2020-10-12 DIAGNOSIS — I10 Essential (primary) hypertension: Secondary | ICD-10-CM | POA: Diagnosis not present

## 2020-10-12 DIAGNOSIS — I5033 Acute on chronic diastolic (congestive) heart failure: Secondary | ICD-10-CM | POA: Diagnosis not present

## 2020-10-12 LAB — RENAL FUNCTION PANEL
Albumin: 2.3 g/dL — ABNORMAL LOW (ref 3.5–5.0)
Anion gap: 16 — ABNORMAL HIGH (ref 5–15)
BUN: 73 mg/dL — ABNORMAL HIGH (ref 8–23)
CO2: 22 mmol/L (ref 22–32)
Calcium: 7.9 mg/dL — ABNORMAL LOW (ref 8.9–10.3)
Chloride: 96 mmol/L — ABNORMAL LOW (ref 98–111)
Creatinine, Ser: 8.52 mg/dL — ABNORMAL HIGH (ref 0.61–1.24)
GFR, Estimated: 6 mL/min — ABNORMAL LOW (ref >60)
Glucose, Bld: 115 mg/dL — ABNORMAL HIGH (ref 70–99)
Phosphorus: 5.6 mg/dL — ABNORMAL HIGH (ref 2.5–4.6)
Potassium: 3.4 mmol/L — ABNORMAL LOW (ref 3.5–5.1)
Sodium: 134 mmol/L — ABNORMAL LOW (ref 135–145)

## 2020-10-12 LAB — CBC
HCT: 28.5 % — ABNORMAL LOW (ref 39.0–52.0)
Hemoglobin: 9.1 g/dL — ABNORMAL LOW (ref 13.0–17.0)
MCH: 29.5 pg (ref 26.0–34.0)
MCHC: 31.9 g/dL (ref 30.0–36.0)
MCV: 92.5 fL (ref 80.0–100.0)
Platelets: 258 10*3/uL (ref 150–400)
RBC: 3.08 MIL/uL — ABNORMAL LOW (ref 4.22–5.81)
RDW: 13.7 % (ref 11.5–15.5)
WBC: 6.2 10*3/uL (ref 4.0–10.5)
nRBC: 0.3 % — ABNORMAL HIGH (ref 0.0–0.2)

## 2020-10-12 LAB — GLUCOSE, CAPILLARY
Glucose-Capillary: 101 mg/dL — ABNORMAL HIGH (ref 70–99)
Glucose-Capillary: 196 mg/dL — ABNORMAL HIGH (ref 70–99)
Glucose-Capillary: 182 mg/dL — ABNORMAL HIGH (ref 70–99)

## 2020-10-12 LAB — SARS CORONAVIRUS 2 BY RT PCR (HOSPITAL ORDER, PERFORMED IN ~~LOC~~ HOSPITAL LAB): SARS Coronavirus 2: NEGATIVE

## 2020-10-12 MED ORDER — HEPARIN SODIUM (PORCINE) 1000 UNIT/ML IJ SOLN
INTRAMUSCULAR | Status: AC
  Administered 2020-10-12: 3800 [IU] via INTRAVENOUS
  Filled 2020-10-12: qty 4

## 2020-10-12 MED ORDER — HEPARIN SODIUM (PORCINE) 1000 UNIT/ML IJ SOLN: 3800.0000 [IU] | INTRAMUSCULAR | Status: DC

## 2020-10-12 MED ORDER — DARBEPOETIN ALFA 40 MCG/0.4ML IJ SOSY: 40.0000 ug | PREFILLED_SYRINGE | INTRAMUSCULAR | Status: DC

## 2020-10-12 NOTE — Progress Notes (Signed)
PROGRESS NOTE  Michael Trevino WGN:[id number] DOB: January 04, 1941 DOA: 09/30/2020 PCP: Dettinger, Fransisca Kaufmann, MD  Brief History   Michael Trevino a79 y.o.malewith past medical history relevant for HTN, DM2, CKD 5 with nephrotic syndrome,, hypothyroidism, CAD with prior MI, and HLD and obesity who initially presented on 09/30/2020 to the surgical unit for creation of a left arm AV fistula by vascular surgeon Dr. Cherylynn Ridges patient complained of some chest discomfort and shortness of breath, so hisprocedure was canceled,and patient was sent to the ED for work-up for his shortness of breath.  Upon presentation the patient was very short of breath and hypoxic required continuous BiPAP initially,also requiring IV nitro drip for elevated BP and CHF flare-up.  received IV Lasix 80 mg x 2 with very limited output. Chest x-ray in the ED showed bilateral pleural effusion left more than right. He underwent successful ultrasound-guided thoracentesis on 09/30/2020 with removal of 1 L from the left pleural space. The results are consistent with a transudative effusion. After the thoracentesis patient was eventually able to come off BiPAP,and transitioned to nasal cannula. Echo performed on 78/46/9629 with diastolic dysfunction, The patient denied fever, chills, productive cough, nausea, vomiting, or diarrhea. No pleuritic chest pain. He does have lower extremity edema. Creatinine was elevated at 5.5 with acidosis and hyperuricemia. His hemoglobin is close to baseline.  LFTs are not elevated. BNP is elevated at 1022 and chest x-ray consistent with CHF with effusions. Troponin is 194 in the setting of renal failure.  Echocardiogram from 09/30/2020 has demonstrated EF of 55-60% with severe LVH, and a small pericardial effusion without significant valvular abnormalities and no significant wall motion abnormalities. No sign of tamponade. Moderate AV regurgitation. Moderate aortic stenosis.   He was started on a  heparin drip by cardiology. They performed LHC on 10/04/2020 after HD was initiated. It demonstrated an occluded RCA with grade 3 left to right collaterals. There was also a 50% mid LAD lesion. LVEDP was only mildly elevated at 19. They feel that his elevated troponins on admission were due to demand ischemia due to volume overload and collateral insufficiency. They have recommended medical therapy.   The patient has had placement of a left arm first stage basilic AV fistula created and the insertion of a right internal jugular tunneled dialysis catheter.   Dialysis navigator has set the patient up for outpatient dialysis on a TTS schedule.   The patient has been evaluated by PT/OT and they have recommended SNF. TOC has been consulted and they have begun the process of finding placement for the patient.  Consultants  . Nephrology . Cardiology . Vascular surgery . Interventional Radiology  Procedures  . Thoracentesis . Placement of AV fistula . Placement of tunneled temporary dialysis catheter . Initiation of HD . Left Heart catheterization.  Antibiotics   Anti-infectives (From admission, onward)   Start     Dose/Rate Route Frequency Ordered Stop   10/05/20 1307  ceFAZolin (ANCEF) 2-4 GM/100ML-% IVPB       Note to Pharmacy: [name]   : cabinet override      10/05/20 1307 10/06/20 0114     Interval History/Subjective  The patient is resting comfortably. No new complaints.  Objective   Vitals:  Vitals:   10/12/20 0800 10/12/20 1125  BP:  (!) 127/43  Pulse:    Resp:    Temp: 97.9 F (36.6 C) 98.4 F (36.9 C)  SpO2:     Exam:  Constitutional:  . The patient is awake, alert, and  oriented x 3. No acute distress. Respiratory:  . No increased work of breathing. . No wheezes, rales, or rhonchi . No tactile fremitus Cardiovascular:  . Regular rate and rhythm . No murmurs, ectopy, or gallups. . No lateral PMI. No thrills. Abdomen:  . Abdomen is soft, non-tender,  non-distended . No hernias, masses, or organomegaly . Normoactive bowel sounds.  Musculoskeletal:  . No cyanosis, clubbing, or edema Skin:  . No rashes, lesions, ulcers . palpation of skin: no induration or nodules Neurologic:  . CN 2-12 intact . Sensation all 4 extremities intact Psychiatric:  . Mental status o Mood, affect appropriate o Orientation to person, place, time  . judgment and insight appear intact  I have personally reviewed the following:   Today's Data  . Vitals, BMP  Micro Data  . 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 CXR  Cardiology Data  . Echocardiogram . Left Heart Catheterization 10/04/2020  Scheduled Meds: . sodium chloride   Intravenous Once  . sodium chloride   Intravenous Once  . amLODipine  10 mg Oral Daily  . ascorbic acid  500 mg Oral Daily  . aspirin  81 mg Oral Daily  . atorvastatin  80 mg Oral Daily  . calcitRIOL  0.5 mcg Oral Daily  . Chlorhexidine Gluconate Cloth  6 each Topical Daily  . cholecalciferol  1,000 Units Oral Daily  . [START ON 10/14/2020] darbepoetin (ARANESP) injection - DIALYSIS  40 mcg Intravenous Q Thu-HD  . feeding supplement (NEPRO CARB STEADY)  237 mL Oral BID BM  . heparin  5,000 Units Subcutaneous Q8H  . hydrALAZINE  50 mg Oral TID  . insulin aspart  0-5 Units Subcutaneous QHS  . insulin aspart  0-9 Units Subcutaneous TID WC  . insulin detemir  25 Units Subcutaneous QHS  . levothyroxine  175 mcg Oral QAC breakfast  . metoprolol tartrate  100 mg Oral BID  . multivitamin  1 tablet Oral QHS  . sodium chloride flush  3 mL Intravenous Q12H  . sodium chloride flush  3 mL Intravenous Q12H  . sodium chloride flush  3 mL Intravenous Q12H   Continuous Infusions: . sodium chloride    . sodium chloride      Principal Problem:   Acute exacerbation of CHF (congestive heart failure) (HCC) Active Problems:   Hypothyroidism   Illiterate   CAD (coronary artery disease)   Non-ST elevation (NSTEMI) myocardial  infarction [name]   Essential hypertension, benign   CKD stage 5 due to type 2 diabetes mellitus (HCC)   Pleural effusion--Lt > Rt   Acute respiratory failure with hypoxia (HCC)   LOS: 12 days   A & P  HPpEF: Acute on chronic CHF exacerbation. Echocardiogram from 09/30/2020 has demonstrated EF of 55-60% with severe LVH, and a small pericardial effusion without significant valvular abnormalities and no significant wall motion abnormalities. No sign of tamponade. Moderate AV regurgitation. Moderate aortic stenosis. The patient is currently 15.877 L negative in terms of fluid balance.   CAD: LHC performed on 10/04/2020.  It demonstrated an occluded RCA with grade 3 left to right collaterals. There was also a 50% mid LAD lesion. LVEDP was only mildly elevated at 19. They feel that his elevated troponins on admission were due to demand ischemia due to volume overload and collateral insufficiency. They have recommended medical therapy.   Pleural effusion Lt greater than Rt: 1L cloudy amber fluid was removed from the patient's left pleural space. It appears to be  transudative in etiology. The studies were not able to perform microbiological studies on the fluid.   Acute hypoxic respiratory failure: Due to pulmonary edema and left pleural effusion. This afternoon the patient is saturating 90% on room air. The patient required 10L by HFNC on admission. He is currently saturating at 94% on room air.  NSTEMI:  Cardiology feels that his elevated troponins on admission were due to demand ischemia due to volume overload and collateral insufficiency.  CKD V. Now on HD: The patient has had placement of a left arm first stage basilic AV fistula created and the insertion of a right internal jugular tunneled dialysis catheter.  Dialysis Navigator has set the patient up for outpatient dialysis on a TTS schedule.  Hypothyroidism: Continue Synthroid as at home.   PTH I: elevated due to renal  insufficiency.  Essential Hypertension: Patient is normotensive today on amlodipine, hydralazine, and metoprolol.  Debility: PT/OT have recommended SNF placement as this patient is too weak even to move from bed to chair without 2+ assistance. TOC is seeking placement.  I have seen and examined this patient myself. I have spent 28 minutes in his evaluation and care.  DVT Prophylaxis: Heparin SQ CODE STATUS: Full Code Family Communication: None available Disposition: Status is: Inpatient  Remains inpatient appropriate because: lack of safe discharge  Dispo: The patient is from: Home              Anticipated d/c is to: SNF              Anticipated d/c date is: 1 day              Patient currently is currently stable for discharge, but does not have a safe discharge.  Lacresia Darwish, DO [facility] Direct contact: see [email or web address]  7PM-7AM contact night coverage as above 10/12/2020, 1:22 PM  LOS: 6 days

## 2020-10-12 NOTE — Progress Notes (Signed)
Renal Navigator appreciates update from [facility] CSW/C. Dargan who reports patient is ready to discharge to [facility] in [place]. Per CSW, SNF can transport to HD clinic. We will plan for patient's first treatment at his outpatient clinic/[facility] to be Thursday, 10/14/20 and needs to arrive at 10:15am on his first day. His seat is TTS 10:45am. CSW aware and will ensure that SNF is also aware.  Renal Navigator has updated [facility].  Alphonzo Cruise, [place] Renal Navigator [phone]

## 2020-10-12 NOTE — Progress Notes (Signed)
Patient ID: Michael Trevino, male   DOB: November 09, 1941, 79 y.o.   MRN: [id number] S: No complaints and no events overnight.   O:BP (!) 136/47 (BP Location: Right Arm)   Pulse 66   Temp 97.9 F (36.6 C) (Oral)   Resp 14   Ht 5\' 9"  (1.753 m)   Wt 97.8 kg   SpO2 94%   BMI 31.84 kg/m   Intake/Output Summary (Last 24 hours) at 10/12/2020 0855 Last data filed at 10/12/2020 0818 Gross per 24 hour  Intake 120 ml  Output 600 ml  Net -480 ml   Intake/Output: I/O last 3 completed shifts: In: -  Out: 600 [Urine:600]  Intake/Output this shift:  Total I/O In: 120 [P.O.:120] Out: -  Weight change: 1.1 kg Gen: Sitting in chair in NAD CVS: RRR, no rub Resp: CTA, decreased BS at LLL field Abd: +BS, soft, NT/ND Ext: no edema, LUE AVF +T/B  Recent Labs  Lab 10/06/20 0049 10/07/20 0058 10/08/20 0713 10/09/20 0451 10/10/20 0039 10/11/20 0114 10/12/20 0101  NA 134* 137 134* 134* 135 135 134*  K 4.2 3.8 3.5 4.0 3.5 3.5 3.4*  CL 98 100 95* 97* 97* 98 96*  CO2 24 27 23 24 26 22 22   GLUCOSE 192* 98 121* 102* 129* 95 115*  BUN 36* 28* 51* 68* 38* 58* 73*  CREATININE 5.13* 4.47* 7.36* 8.45* 5.88* 7.55* 8.52*  ALBUMIN 2.1* 2.2* 2.2* 2.1* 2.2* 2.2* 2.3*  CALCIUM 7.3* 7.7* 7.6* 7.4* 7.8* 7.7* 7.9*  PHOS 5.3* 3.0 3.7 4.6 3.8 4.6 5.6*   Liver Function Tests: Recent Labs  Lab 10/10/20 0039 10/11/20 0114 10/12/20 0101  ALBUMIN 2.2* 2.2* 2.3*   No results for input(s): LIPASE, AMYLASE in the last 168 hours. No results for input(s): AMMONIA in the last 168 hours. CBC: Recent Labs  Lab 10/06/20 0049 10/06/20 0049 10/07/20 0058 10/09/20 1344 10/11/20 0114  WBC 8.3   < > 8.0 6.4 6.9  NEUTROABS  --   --  5.5  --  3.6  HGB 8.9*   < > 8.9* 8.2* 8.8*  HCT 27.2*   < > 26.5* 25.9* 26.7*  MCV 88.6  --  89.2 92.2 89.3  PLT 143*   < > 149* 228 210   < > = values in this interval not displayed.   Cardiac Enzymes: No results for input(s): CKTOTAL, CKMB, CKMBINDEX, TROPONINI in the last 168  hours. CBG: Recent Labs  Lab 10/11/20 1124 10/11/20 1616 10/11/20 1745 10/11/20 2110 10/12/20 0600  GLUCAP 179* 137* 144* 191* 101*    Iron Studies: No results for input(s): IRON, TIBC, TRANSFERRIN, FERRITIN in the last 72 hours. Studies/Results: No results found. . sodium chloride   Intravenous Once  . sodium chloride   Intravenous Once  . amLODipine  10 mg Oral Daily  . ascorbic acid  500 mg Oral Daily  . aspirin  81 mg Oral Daily  . atorvastatin  80 mg Oral Daily  . calcitRIOL  0.5 mcg Oral Daily  . Chlorhexidine Gluconate Cloth  6 each Topical Daily  . cholecalciferol  1,000 Units Oral Daily  . [START ON 10/14/2020] darbepoetin (ARANESP) injection - DIALYSIS  40 mcg Intravenous Q Thu-HD  . feeding supplement (NEPRO CARB STEADY)  237 mL Oral BID BM  . heparin  5,000 Units Subcutaneous Q8H  . hydrALAZINE  50 mg Oral TID  . insulin aspart  0-5 Units Subcutaneous QHS  . insulin aspart  0-9 Units Subcutaneous TID WC  .  insulin detemir  25 Units Subcutaneous QHS  . levothyroxine  175 mcg Oral QAC breakfast  . metoprolol tartrate  100 mg Oral BID  . multivitamin  1 tablet Oral QHS  . sodium chloride flush  3 mL Intravenous Q12H  . sodium chloride flush  3 mL Intravenous Q12H  . sodium chloride flush  3 mL Intravenous Q12H    BMET    Component Value Date/Time   NA 134 (L) 10/12/2020 0101   NA 142 07/12/2020 1209   K 3.4 (L) 10/12/2020 0101   CL 96 (L) 10/12/2020 0101   CO2 22 10/12/2020 0101   GLUCOSE 115 (H) 10/12/2020 0101   BUN 73 (H) 10/12/2020 0101   BUN 54 (H) 07/12/2020 1209   CREATININE 8.52 (H) 10/12/2020 0101   CALCIUM 7.9 (L) 10/12/2020 0101   GFRNONAA 6 (L) 10/12/2020 0101   GFRAA 13 (L) 07/12/2020 1209   CBC    Component Value Date/Time   WBC 6.9 10/11/2020 0114   RBC 2.99 (L) 10/11/2020 0114   HGB 8.8 (L) 10/11/2020 0114   HGB 10.1 (L) 07/12/2020 1209   HCT 26.7 (L) 10/11/2020 0114   HCT 30.2 (L) 07/12/2020 1209   PLT 210 10/11/2020 0114   PLT  198 07/12/2020 1209   MCV 89.3 10/11/2020 0114   MCV 87 07/12/2020 1209   MCH 29.4 10/11/2020 0114   MCHC 33.0 10/11/2020 0114   RDW 13.4 10/11/2020 0114   RDW 15.1 07/12/2020 1209   LYMPHSABS 2.0 10/11/2020 0114   LYMPHSABS 2.7 07/12/2020 1209   MONOABS 1.1 (H) 10/11/2020 0114   EOSABS 0.1 10/11/2020 0114   EOSABS 0.1 07/12/2020 1209   BASOSABS 0.0 10/11/2020 0114   BASOSABS 0.0 07/12/2020 1209    Assessment/Plan:  1. 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 1. TTS schedule at [facility] after discharge 2. Plan for HD today to keep on outpatient schedule.  2. Acute hypoxic respiratory failure with large left pleural effusion- s/p thoracentesis with 1 liter of blood-tinged fluid removed 3. NSTEMI- s/p LHC and chronic RCA occlusion.  Medical management per Cardiology. 4. HTN- stable 5. SHPTH- on calcitriol.  Follow Ca and phos levels.  No binders for now. 6. Anemia of CKD- on aranesp and started on IV iron. 7. Vascular access- LUE AVF placed 10/05/20 by Dr. Scot Dock.  RIJ TDC placed 11/23 and place wrist band on left arm to avoid blood draws and BP's in that arm.  8. Disposition- pending SNF placement with HD transportation.  Donetta Potts, MD [employer] [phone]

## 2020-10-13 ENCOUNTER — Other Ambulatory Visit: Payer: Self-pay | Admitting: "Endocrinology

## 2020-10-13 ENCOUNTER — Ambulatory Visit: Payer: Medicare Other | Admitting: Family Medicine

## 2020-10-13 DIAGNOSIS — I5033 Acute on chronic diastolic (congestive) heart failure: Secondary | ICD-10-CM | POA: Diagnosis not present

## 2020-10-13 DIAGNOSIS — M25559 Pain in unspecified hip: Secondary | ICD-10-CM | POA: Diagnosis not present

## 2020-10-13 DIAGNOSIS — I959 Hypotension, unspecified: Secondary | ICD-10-CM | POA: Diagnosis not present

## 2020-10-13 DIAGNOSIS — R410 Disorientation, unspecified: Secondary | ICD-10-CM | POA: Diagnosis not present

## 2020-10-13 DIAGNOSIS — I132 Hypertensive heart and chronic kidney disease with heart failure and with stage 5 chronic kidney disease, or end stage renal disease: Secondary | ICD-10-CM | POA: Diagnosis not present

## 2020-10-13 DIAGNOSIS — M25551 Pain in right hip: Secondary | ICD-10-CM | POA: Diagnosis not present

## 2020-10-13 DIAGNOSIS — I214 Non-ST elevation (NSTEMI) myocardial infarction: Secondary | ICD-10-CM | POA: Diagnosis not present

## 2020-10-13 DIAGNOSIS — K7689 Other specified diseases of liver: Secondary | ICD-10-CM | POA: Diagnosis not present

## 2020-10-13 DIAGNOSIS — Z9981 Dependence on supplemental oxygen: Secondary | ICD-10-CM | POA: Diagnosis not present

## 2020-10-13 DIAGNOSIS — K5730 Diverticulosis of large intestine without perforation or abscess without bleeding: Secondary | ICD-10-CM | POA: Diagnosis not present

## 2020-10-13 DIAGNOSIS — D631 Anemia in chronic kidney disease: Secondary | ICD-10-CM | POA: Diagnosis not present

## 2020-10-13 DIAGNOSIS — R6520 Severe sepsis without septic shock: Secondary | ICD-10-CM | POA: Diagnosis not present

## 2020-10-13 DIAGNOSIS — B965 Pseudomonas (aeruginosa) (mallei) (pseudomallei) as the cause of diseases classified elsewhere: Secondary | ICD-10-CM | POA: Diagnosis not present

## 2020-10-13 DIAGNOSIS — I4819 Other persistent atrial fibrillation: Secondary | ICD-10-CM | POA: Diagnosis not present

## 2020-10-13 DIAGNOSIS — N390 Urinary tract infection, site not specified: Secondary | ICD-10-CM | POA: Diagnosis not present

## 2020-10-13 DIAGNOSIS — N1330 Unspecified hydronephrosis: Secondary | ICD-10-CM | POA: Diagnosis not present

## 2020-10-13 DIAGNOSIS — Z79899 Other long term (current) drug therapy: Secondary | ICD-10-CM | POA: Diagnosis not present

## 2020-10-13 DIAGNOSIS — M79651 Pain in right thigh: Secondary | ICD-10-CM | POA: Diagnosis not present

## 2020-10-13 DIAGNOSIS — E038 Other specified hypothyroidism: Secondary | ICD-10-CM | POA: Diagnosis not present

## 2020-10-13 DIAGNOSIS — N186 End stage renal disease: Secondary | ICD-10-CM | POA: Diagnosis not present

## 2020-10-13 DIAGNOSIS — A4152 Sepsis due to Pseudomonas: Secondary | ICD-10-CM | POA: Diagnosis not present

## 2020-10-13 DIAGNOSIS — I5032 Chronic diastolic (congestive) heart failure: Secondary | ICD-10-CM | POA: Diagnosis not present

## 2020-10-13 DIAGNOSIS — M25561 Pain in right knee: Secondary | ICD-10-CM | POA: Diagnosis not present

## 2020-10-13 DIAGNOSIS — I5021 Acute systolic (congestive) heart failure: Secondary | ICD-10-CM | POA: Diagnosis not present

## 2020-10-13 DIAGNOSIS — R0689 Other abnormalities of breathing: Secondary | ICD-10-CM | POA: Diagnosis not present

## 2020-10-13 DIAGNOSIS — E871 Hypo-osmolality and hyponatremia: Secondary | ICD-10-CM | POA: Diagnosis not present

## 2020-10-13 DIAGNOSIS — R338 Other retention of urine: Secondary | ICD-10-CM | POA: Diagnosis not present

## 2020-10-13 DIAGNOSIS — R0902 Hypoxemia: Secondary | ICD-10-CM | POA: Diagnosis not present

## 2020-10-13 DIAGNOSIS — I509 Heart failure, unspecified: Secondary | ICD-10-CM | POA: Diagnosis not present

## 2020-10-13 DIAGNOSIS — Z20822 Contact with and (suspected) exposure to covid-19: Secondary | ICD-10-CM | POA: Diagnosis present

## 2020-10-13 DIAGNOSIS — Z7189 Other specified counseling: Secondary | ICD-10-CM | POA: Diagnosis not present

## 2020-10-13 DIAGNOSIS — Z7401 Bed confinement status: Secondary | ICD-10-CM | POA: Diagnosis not present

## 2020-10-13 DIAGNOSIS — J9601 Acute respiratory failure with hypoxia: Secondary | ICD-10-CM | POA: Diagnosis not present

## 2020-10-13 DIAGNOSIS — J90 Pleural effusion, not elsewhere classified: Secondary | ICD-10-CM | POA: Diagnosis not present

## 2020-10-13 DIAGNOSIS — I517 Cardiomegaly: Secondary | ICD-10-CM | POA: Diagnosis not present

## 2020-10-13 DIAGNOSIS — M79661 Pain in right lower leg: Secondary | ICD-10-CM | POA: Diagnosis not present

## 2020-10-13 DIAGNOSIS — I2510 Atherosclerotic heart disease of native coronary artery without angina pectoris: Secondary | ICD-10-CM | POA: Diagnosis not present

## 2020-10-13 DIAGNOSIS — Z66 Do not resuscitate: Secondary | ICD-10-CM | POA: Diagnosis not present

## 2020-10-13 DIAGNOSIS — F05 Delirium due to known physiological condition: Secondary | ICD-10-CM | POA: Diagnosis present

## 2020-10-13 DIAGNOSIS — M2550 Pain in unspecified joint: Secondary | ICD-10-CM | POA: Diagnosis not present

## 2020-10-13 DIAGNOSIS — N185 Chronic kidney disease, stage 5: Secondary | ICD-10-CM | POA: Diagnosis not present

## 2020-10-13 DIAGNOSIS — F0390 Unspecified dementia without behavioral disturbance: Secondary | ICD-10-CM | POA: Diagnosis present

## 2020-10-13 DIAGNOSIS — R4020 Unspecified coma: Secondary | ICD-10-CM | POA: Diagnosis not present

## 2020-10-13 DIAGNOSIS — I5022 Chronic systolic (congestive) heart failure: Secondary | ICD-10-CM | POA: Diagnosis not present

## 2020-10-13 DIAGNOSIS — J811 Chronic pulmonary edema: Secondary | ICD-10-CM | POA: Diagnosis not present

## 2020-10-13 DIAGNOSIS — Z992 Dependence on renal dialysis: Secondary | ICD-10-CM | POA: Diagnosis not present

## 2020-10-13 DIAGNOSIS — J9691 Respiratory failure, unspecified with hypoxia: Secondary | ICD-10-CM | POA: Diagnosis not present

## 2020-10-13 DIAGNOSIS — I10 Essential (primary) hypertension: Secondary | ICD-10-CM | POA: Diagnosis not present

## 2020-10-13 DIAGNOSIS — E1165 Type 2 diabetes mellitus with hyperglycemia: Secondary | ICD-10-CM | POA: Diagnosis not present

## 2020-10-13 DIAGNOSIS — E1122 Type 2 diabetes mellitus with diabetic chronic kidney disease: Secondary | ICD-10-CM | POA: Diagnosis not present

## 2020-10-13 DIAGNOSIS — N184 Chronic kidney disease, stage 4 (severe): Secondary | ICD-10-CM | POA: Diagnosis not present

## 2020-10-13 DIAGNOSIS — E669 Obesity, unspecified: Secondary | ICD-10-CM | POA: Diagnosis present

## 2020-10-13 DIAGNOSIS — A419 Sepsis, unspecified organism: Secondary | ICD-10-CM | POA: Diagnosis not present

## 2020-10-13 DIAGNOSIS — R2689 Other abnormalities of gait and mobility: Secondary | ICD-10-CM | POA: Diagnosis not present

## 2020-10-13 DIAGNOSIS — E039 Hypothyroidism, unspecified: Secondary | ICD-10-CM | POA: Diagnosis not present

## 2020-10-13 DIAGNOSIS — G9340 Encephalopathy, unspecified: Secondary | ICD-10-CM | POA: Diagnosis not present

## 2020-10-13 DIAGNOSIS — I252 Old myocardial infarction: Secondary | ICD-10-CM | POA: Diagnosis not present

## 2020-10-13 DIAGNOSIS — M6281 Muscle weakness (generalized): Secondary | ICD-10-CM | POA: Diagnosis not present

## 2020-10-13 DIAGNOSIS — R52 Pain, unspecified: Secondary | ICD-10-CM | POA: Diagnosis not present

## 2020-10-13 DIAGNOSIS — G9341 Metabolic encephalopathy: Secondary | ICD-10-CM | POA: Diagnosis not present

## 2020-10-13 DIAGNOSIS — R339 Retention of urine, unspecified: Secondary | ICD-10-CM | POA: Diagnosis not present

## 2020-10-13 DIAGNOSIS — N2581 Secondary hyperparathyroidism of renal origin: Secondary | ICD-10-CM | POA: Diagnosis not present

## 2020-10-13 DIAGNOSIS — Z515 Encounter for palliative care: Secondary | ICD-10-CM | POA: Diagnosis not present

## 2020-10-13 DIAGNOSIS — E875 Hyperkalemia: Secondary | ICD-10-CM | POA: Diagnosis not present

## 2020-10-13 DIAGNOSIS — R5381 Other malaise: Secondary | ICD-10-CM | POA: Diagnosis not present

## 2020-10-13 LAB — GLUCOSE, CAPILLARY
Glucose-Capillary: 87 mg/dL (ref 70–99)
Glucose-Capillary: 121 mg/dL — ABNORMAL HIGH (ref 70–99)

## 2020-10-13 LAB — RENAL FUNCTION PANEL
Albumin: 2.3 g/dL — ABNORMAL LOW (ref 3.5–5.0)
Anion gap: 14 (ref 5–15)
BUN: 45 mg/dL — ABNORMAL HIGH (ref 8–23)
CO2: 25 mmol/L (ref 22–32)
Calcium: 7.7 mg/dL — ABNORMAL LOW (ref 8.9–10.3)
Chloride: 98 mmol/L (ref 98–111)
Creatinine, Ser: 6.2 mg/dL — ABNORMAL HIGH (ref 0.61–1.24)
GFR, Estimated: 9 mL/min — ABNORMAL LOW (ref >60)
Glucose, Bld: 106 mg/dL — ABNORMAL HIGH (ref 70–99)
Phosphorus: 4.5 mg/dL (ref 2.5–4.6)
Potassium: 3.9 mmol/L (ref 3.5–5.1)
Sodium: 137 mmol/L (ref 135–145)

## 2020-10-13 MED ORDER — NEPRO/CARBSTEADY PO LIQD: 237.0000 mL | Freq: Two times a day (BID) | ORAL | 0 refills | Status: AC

## 2020-10-13 MED ORDER — ATORVASTATIN CALCIUM 80 MG PO TABS: 80.0000 mg | ORAL_TABLET | Freq: Every day | ORAL | Status: AC

## 2020-10-13 MED ORDER — DARBEPOETIN ALFA 40 MCG/0.4ML IJ SOSY: 40.0000 ug | PREFILLED_SYRINGE | INTRAMUSCULAR | Status: AC

## 2020-10-13 MED ORDER — BISACODYL 10 MG RE SUPP: 10.0000 mg | Freq: Every day | RECTAL | 0 refills | Status: AC | PRN

## 2020-10-13 MED ORDER — RENA-VITE PO TABS: 1.0000 | ORAL_TABLET | Freq: Every day | ORAL | 0 refills | Status: AC

## 2020-10-13 MED ORDER — ASPIRIN 81 MG PO CHEW: 81.0000 mg | CHEWABLE_TABLET | Freq: Every day | ORAL | Status: AC

## 2020-10-13 MED ORDER — ACETAMINOPHEN 325 MG PO TABS: 650.0000 mg | ORAL_TABLET | ORAL | Status: AC | PRN

## 2020-10-13 MED ORDER — METOPROLOL TARTRATE 100 MG PO TABS: 100.0000 mg | ORAL_TABLET | Freq: Two times a day (BID) | ORAL | Status: AC

## 2020-10-13 MED ORDER — HYDRALAZINE HCL 50 MG PO TABS: 50.0000 mg | ORAL_TABLET | Freq: Three times a day (TID) | ORAL | Status: AC

## 2020-10-13 MED ORDER — NITROGLYCERIN 0.4 MG SL SUBL: 0.4000 mg | SUBLINGUAL_TABLET | SUBLINGUAL | 12 refills | Status: AC | PRN

## 2020-10-13 NOTE — TOC Transition Note (Signed)
Transition of Care [facility]) - CM/SW Discharge Note   Patient Details  Name: Marshel Golubski MRN: [id number] Date of Birth: 04-24-1941  Transition of Care [facility]) CM/SW Contact:  Loreta Ave, [place] Phone Number: 10/13/2020, 9:13 AM   Clinical Narrative:    Patient will DC to: [facility] [place]  Anticipated DC date: 10/13/2020 Family notified: Dimple Casey Transport by: Corey Harold   Per MD patient ready for DC to [facility] [place]. RN to call report prior to discharge [phone], [place] hall nurses station. RN, patient, patient's family, and facility notified of DC. Discharge Summary and FL2 sent to facility. DC packet on chart. Ambulance transport requested for patient.   CSW will sign off for now as social work intervention is no longer needed. Please consult [place] again if new needs arise.     Final next level of care: Skilled Nursing Facility Barriers to Discharge: Ship broker, Continued Medical Work up, SNF Pending bed offer   Patient Goals and CMS Choice        Discharge Placement                       Discharge Plan and Services In-house Referral: Clinical Social Work                                   Social Determinants of Health (SDOH) Interventions     Readmission Risk Interventions No flowsheet data found.

## 2020-10-13 NOTE — Progress Notes (Signed)
Patient ID: Marcell Chavarin, male   DOB: [birth year], 79 y.o.   MRN: [id number] S: No new complaints O:BP (!) 141/46 (BP Location: Right Arm)   Pulse 68   Temp 98.5 F (36.9 C) (Oral)   Resp 16   Ht 5\' 9"  (1.753 m)   Wt 94.3 kg   SpO2 95%   BMI 30.70 kg/m   Intake/Output Summary (Last 24 hours) at 10/13/2020 1007 Last data filed at 10/13/2020 0309 Gross per 24 hour  Intake --  Output 2005 ml  Net -2005 ml   Intake/Output: I/O last 3 completed shifts: In: 120 [P.O.:120] Out: 2605 [Urine:601; Other:2003; Stool:1]  Intake/Output this shift:  No intake/output data recorded. Weight change: 0.3 kg Gen: NAD CVS: RRR Resp:cta Abd: +BS, soft, Nt/ND Ext: LUE AVF +T/B, no edema  Recent Labs  Lab 10/07/20 0058 10/08/20 0713 10/09/20 0451 10/10/20 0039 10/11/20 0114 10/12/20 0101 10/13/20 0235  NA 137 134* 134* 135 135 134* 137  K 3.8 3.5 4.0 3.5 3.5 3.4* 3.9  CL 100 95* 97* 97* 98 96* 98  CO2 27 23 24 26 22 22 25   GLUCOSE 98 121* 102* 129* 95 115* 106*  BUN 28* 51* 68* 38* 58* 73* 45*  CREATININE 4.47* 7.36* 8.45* 5.88* 7.55* 8.52* 6.20*  ALBUMIN 2.2* 2.2* 2.1* 2.2* 2.2* 2.3* 2.3*  CALCIUM 7.7* 7.6* 7.4* 7.8* 7.7* 7.9* 7.7*  PHOS 3.0 3.7 4.6 3.8 4.6 5.6* 4.5   Liver Function Tests: Recent Labs  Lab 10/11/20 0114 10/12/20 0101 10/13/20 0235  ALBUMIN 2.2* 2.3* 2.3*   No results for input(s): LIPASE, AMYLASE in the last 168 hours. No results for input(s): AMMONIA in the last 168 hours. CBC: Recent Labs  Lab 10/07/20 0058 10/07/20 0058 10/09/20 1344 10/11/20 0114 10/12/20 1317  WBC 8.0   < > 6.4 6.9 6.2  NEUTROABS 5.5  --   --  3.6  --   HGB 8.9*   < > 8.2* 8.8* 9.1*  HCT 26.5*   < > 25.9* 26.7* 28.5*  MCV 89.2  --  92.2 89.3 92.5  PLT 149*   < > 228 210 258   < > = values in this interval not displayed.   Cardiac Enzymes: No results for input(s): CKTOTAL, CKMB, CKMBINDEX, TROPONINI in the last 168 hours. CBG: Recent Labs  Lab 10/11/20 2110 10/12/20 0600  10/12/20 1125 10/12/20 2109 10/13/20 0604  GLUCAP 191* 101* 196* 182* 87    Iron Studies: No results for input(s): IRON, TIBC, TRANSFERRIN, FERRITIN in the last 72 hours. Studies/Results: No results found. . sodium chloride   Intravenous Once  . sodium chloride   Intravenous Once  . amLODipine  10 mg Oral Daily  . ascorbic acid  500 mg Oral Daily  . aspirin  81 mg Oral Daily  . atorvastatin  80 mg Oral Daily  . calcitRIOL  0.5 mcg Oral Daily  . Chlorhexidine Gluconate Cloth  6 each Topical Daily  . cholecalciferol  1,000 Units Oral Daily  . [START ON 10/14/2020] darbepoetin (ARANESP) injection - DIALYSIS  40 mcg Intravenous Q Thu-HD  . feeding supplement (NEPRO CARB STEADY)  237 mL Oral BID BM  . heparin  5,000 Units Subcutaneous Q8H  . [START ON 10/14/2020] heparin sodium (porcine)  3,800 Units Intravenous Q T,Th,Sa-HD  . hydrALAZINE  50 mg Oral TID  . insulin aspart  0-5 Units Subcutaneous QHS  . insulin aspart  0-9 Units Subcutaneous TID WC  . insulin detemir  25 Units Subcutaneous  QHS  . levothyroxine  175 mcg Oral QAC breakfast  . metoprolol tartrate  100 mg Oral BID  . multivitamin  1 tablet Oral QHS  . sodium chloride flush  3 mL Intravenous Q12H  . sodium chloride flush  3 mL Intravenous Q12H    BMET    Component Value Date/Time   NA 137 10/13/2020 0235   NA 142 07/12/2020 1209   K 3.9 10/13/2020 0235   CL 98 10/13/2020 0235   CO2 25 10/13/2020 0235   GLUCOSE 106 (H) 10/13/2020 0235   BUN 45 (H) 10/13/2020 0235   BUN 54 (H) 07/12/2020 1209   CREATININE 6.20 (H) 10/13/2020 0235   CALCIUM 7.7 (L) 10/13/2020 0235   GFRNONAA 9 (L) 10/13/2020 0235   GFRAA 13 (L) 07/12/2020 1209   CBC    Component Value Date/Time   WBC 6.2 10/12/2020 1317   RBC 3.08 (L) 10/12/2020 1317   HGB 9.1 (L) 10/12/2020 1317   HGB 10.1 (L) 07/12/2020 1209   HCT 28.5 (L) 10/12/2020 1317   HCT 30.2 (L) 07/12/2020 1209   PLT 258 10/12/2020 1317   PLT 198 07/12/2020 1209   MCV 92.5  10/12/2020 1317   MCV 87 07/12/2020 1209   MCH 29.5 10/12/2020 1317   MCHC 31.9 10/12/2020 1317   RDW 13.7 10/12/2020 1317   RDW 15.1 07/12/2020 1209   LYMPHSABS 2.0 10/11/2020 0114   LYMPHSABS 2.7 07/12/2020 1209   MONOABS 1.1 (H) 10/11/2020 0114   EOSABS 0.1 10/11/2020 0114   EOSABS 0.1 07/12/2020 1209   BASOSABS 0.0 10/11/2020 0114   BASOSABS 0.0 07/12/2020 1209    Assessment/Plan:  1. 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 1. TTS schedule at [facility] after discharge 2. Plan for HD today to keep on outpatient schedule.  2. Acute hypoxic respiratory failure with large left pleural effusion- s/p thoracentesis with 1 liter of blood-tinged fluid removed 3. NSTEMI- s/p LHC and chronic RCA occlusion. Medical management per Cardiology. 4. HTN- stable 5. SHPTH- on calcitriol. Follow Ca and phos levels. No binders for now. 6. Anemia of CKD- on aranesp and started on IV iron. 7. Vascular access- LUE AVF placed 10/05/20 by Dr. Scot Dock. RIJ TDC placed 11/23 and place wrist band on left arm to avoid blood draws and BP's in that arm.  8. Disposition- going to SNF/[facility] in [place] and will start outpatient HD at [facility] tomorrow at 10:15 am.     Donetta Potts, MD [facility] [phone]

## 2020-10-13 NOTE — Discharge Summary (Addendum)
Michael Trevino AOZ:[id number] DOB: June 25, 1941 DOA: 09/30/2020  PCP: Dettinger, Fransisca Kaufmann, MD  Admit date: 09/30/2020 Discharge date: 10/13/2020  Admitted From: home Disposition:  SNF  Recommendations for Outpatient Follow-up:  1. Follow up with PCP in 1 week 2. Please obtain BMP/CBC in one week 3. Cardiology in one week 4. F/u clinic/[facility] to be Thursday, 10/14/20 and needs to arrive at 10:15am on his first day. His seat is TTS 10:45am.     Discharge Condition:Stable CODE STATUS:full  Diet recommendation: Renal diet   Brief/Interim Summary: Please note this is my first day taking over patients care, information was gathered from chart review and HP: Michael Trevino a79 y.o.malewith past medical history relevant for HTN, DM2, CKD 5 with nephrotic syndrome,, hypothyroidism, CAD with prior MI, and HLD and obesity who initially presented on 09/30/2020 to the surgical unit for creation of a left arm AV fistula by vascular surgeon Dr. Cherylynn Ridges patient complained of some chest discomfort and shortness of breath, so hisprocedure was canceled,and patient was sent to the ED for work-up for his shortness of breath.  Upon presentation the patient was very short of breath and hypoxic required continuous BiPAP initially,also requiring IV nitro drip for elevated BP and CHF flare-up.  received IV Lasix 80 mg x 2 with very limited output. Chest x-ray in the ED showed bilateral pleural effusion left more than right. He underwent successful ultrasound-guided thoracentesis on 09/30/2020 with removal of 1 L from the left pleural space. The results are consistent with a transudative effusion. After the thoracentesis patient was eventually able to come off BiPAP,and transitioned to nasal cannula. Echo performed on 96/29/5284 with diastolic dysfunction, The patient denied fever, chills, productive cough, nausea, vomiting, or diarrhea. No pleuritic chest pain. He does have lower extremity  edema. Creatinine was elevated at 5.5 with acidosis and hyperuricemia. His hemoglobin is close to baseline.  BNP was elevated at 1022 and chest x-ray consistent with CHF with effusions. Troponin is 194 in the setting of renal failure.  Echocardiogram from 09/30/2020 has demonstrated EF of 55-60% with severe LVH, and a small pericardial effusion without significant valvular abnormalities and no significant wall motion abnormalities. No sign of tamponade. Moderate AV regurgitation. Moderate aortic stenosis.   He was started on a heparin drip by cardiology. They performed LHC on 10/04/2020 after HD was initiated. It demonstrated an occluded RCA with grade 3 left to right collaterals. There was also a 50% mid LAD lesion. LVEDP was only mildly elevated at 19. They feel that his elevated troponins on admission were due to demand ischemia due to volume overload and collateral insufficiency. They have recommended medical therapy.   The patient has had placement of a left arm first stage basilic AV fistula created and the insertion of a right internal jugular tunneled dialysis catheter.   Dialysis navigator has set the patient up for outpatient dialysis on a TTS schedule.   The patient has been evaluated by PT/OT and they have recommended SNF.   HPpEF: Acute on chronic CHF exacerbation. Echocardiogram from 09/30/2020 has demonstrated EF of 55-60% with severe LVH, and a small pericardial effusion without significant valvular abnormalities and no significant wall motion abnormalities. No sign of tamponade. Moderate AV regurgitation. Moderate aortic stenosis.   CAD: LHC performed on 10/04/2020.  It demonstrated an occluded RCA with grade 3 left to right collaterals. There was also a 50% mid LAD lesion. LVEDP was only mildly elevated at 19. They feel that his elevated troponins on admission were due  to demand ischemia due to volume overload and collateral insufficiency. They have recommended medical therapy.    Pleural effusion Lt greater than Rt: 1L cloudy amber fluid was removed from the patient's left pleural space. It appears to be transudative in etiology. The studies were not able to perform microbiological studies on the fluid.   Acute hypoxic respiratory failure: Due to pulmonary edema and left pleural effusion.  The patient required 10L by HFNC on admission. He is currently saturating at 94% on room air.  NSTEMI:  Cardiology feels that his elevated troponins on admission were due to demand ischemia due to volume overload and collateral insufficiency.  CKD V. Now on HD: The patient has had placement of a left arm first stage basilic AV fistula created and the insertion of a right internal jugular tunneled dialysis catheter.  Dialysis Navigator has set the patient up for outpatient dialysis on a TTS schedule.Had HD yesterday  Hypothyroidism: Continue Synthroid as at home.   PTH I: elevated due to renal insufficiency.  Essential Hypertension: continue  amlodipine, hydralazine, and metoprolol.  Debility: PT/OT have recommended SNF placement   Discharge Diagnoses:  Principal Problem:   Acute exacerbation of CHF (congestive heart failure) (HCC) Active Problems:   Hypothyroidism   Illiterate   CAD (coronary artery disease)   Non-ST elevation (NSTEMI) myocardial infarction [facility])   Essential hypertension, benign   CKD stage 5 due to type 2 diabetes mellitus (HCC)   Pleural effusion--Lt > Rt   Acute respiratory failure with hypoxia [facility])    Discharge Instructions  Discharge Instructions    AMB referral to CHF clinic   Complete by: As directed    Diet - low sodium heart healthy   Complete by: As directed    Renal diet   Increase activity slowly   Complete by: As directed    No wound care   Complete by: As directed      Allergies as of 10/13/2020      Reactions   Zocor [simvastatin - High Dose] Nausea Only   Unknown      Medication List    STOP taking these  medications   aspirin EC 81 MG tablet Replaced by: aspirin 81 MG chewable tablet   furosemide 80 MG tablet Commonly known as: LASIX     TAKE these medications   acetaminophen 325 MG tablet Commonly known as: TYLENOL Take 2 tablets (650 mg total) by mouth every 4 (four) hours as needed for headache or mild pain.   amLODipine 10 MG tablet Commonly known as: NORVASC TAKE 1 TABLET BY MOUTH EVERY DAY   ascorbic acid 500 MG tablet Commonly known as: VITAMIN C Take 500 mg by mouth daily.   aspirin 81 MG chewable tablet Chew 1 tablet (81 mg total) by mouth daily. Start taking on: October 14, 2020 Replaces: aspirin EC 81 MG tablet   atorvastatin 80 MG tablet Commonly known as: LIPITOR Take 1 tablet (80 mg total) by mouth daily. Start taking on: October 14, 2020 What changed:   medication strength  how much to take   bisacodyl 10 MG suppository Commonly known as: DULCOLAX Place 1 suppository (10 mg total) rectally daily as needed for moderate constipation.   calcitRIOL 0.5 MCG capsule Commonly known as: ROCALTROL Take 0.5 mcg by mouth daily.   Calcium Carbonate 500 MG Chew Chew 1 tablet (500 mg total) by mouth 3 (three) times daily as needed. What changed: when to take this   cholecalciferol 25 MCG (1000 UNIT)  tablet Commonly known as: VITAMIN D3 Take 1,000 Units by mouth daily.   Darbepoetin Alfa 40 MCG/0.4ML Sosy injection Commonly known as: ARANESP Inject 0.4 mLs (40 mcg total) into the vein every Thursday with hemodialysis. Start taking on: October 14, 2020   feeding supplement (NEPRO CARB STEADY) Liqd Take 237 mLs by mouth 2 (two) times daily between meals.   glucose blood test strip Use to check blood glucose once daily.  Dx:  E11.9   hydrALAZINE 50 MG tablet Commonly known as: APRESOLINE Take 1 tablet (50 mg total) by mouth 3 (three) times daily.   insulin detemir 100 UNIT/ML FlexPen Commonly known as: Levemir FlexPen Inject 25 Units into the skin at  bedtime.   levothyroxine 175 MCG tablet Commonly known as: SYNTHROID TAKE 1 TABLET (175 MCG TOTAL) BY MOUTH DAILY BEFORE BREAKFAST.   metoprolol tartrate 100 MG tablet Commonly known as: LOPRESSOR Take 1 tablet (100 mg total) by mouth 2 (two) times daily.   multivitamin Tabs tablet Take 1 tablet by mouth at bedtime.   nitroGLYCERIN 0.4 MG SL tablet Commonly known as: NITROSTAT Place 1 tablet (0.4 mg total) under the tongue every 5 (five) minutes as needed for chest pain.   oxyCODONE-acetaminophen 5-325 MG tablet Commonly known as: Percocet Take 1 tablet by mouth every 4 (four) hours as needed for severe pain.       Contact information for follow-up providers    Angelia Mould, MD Follow up.   Specialties: Vascular Surgery, Cardiology Why: 4-6 weeks. The office will call the patient to make appointment (sent) Contact information: [place] [address] [phone]        Minus Breeding, MD Follow up in 1 week(s).   Specialty: Cardiology Contact information: [address] [place] [address] [phone]            Contact information for after-discharge care    Destination    [facility] Preferred SNF .   Service: Skilled Nursing Contact information: [address] [place] [phone]                 Allergies  Allergen Reactions  . Zocor [Simvastatin - High Dose] Nausea Only    Unknown    Consultations:  Nephrology  Cardiology  Vascular surgery   Procedures/Studies: CARDIAC CATHETERIZATION  Result Date: 10/04/2020  Ost RCA to Prox RCA lesion is 100% stenosed.  Mid LAD lesion is 50% stenosed.  Michael Trevino is a 79 y.o. male  [id number] LOCATION:  FACILITY: [facility] PHYSICIAN: Quay Burow, M.D. [birth year] DATE OF PROCEDURE:  10/04/2020 DATE OF DISCHARGE: CARDIAC CATHETERIZATION History obtained from chart review.79 y.o. male with  known CAD admitted with NSTEMI. EF remains normal on TTE this admission.  He does have moderate AI.  He has a temporary dialysis catheter in place and will ultimately need an AV fistula placed for long-term hemodialysis.  He was referred for diagnostic coronary angiography because of chest pain shortness of breath and positive enzymes.   Michael Trevino  has an occluded RCA (CTO) with grade 3 left-to-right collaterals.  He has a 50% mid LAD lesion.  His LVEDP was only mildly elevated at 19.  He was admitted with chest pain and shortness of breath.  His BNP was elevated and his enzymes peaked at about 3000.  I believe this was demand ischemia from volume overload and collateral insufficiency.  Medical therapy will be recommended.  He will need  an AV fistula placed once he is hemodynamically stable.  A right common femoral angiogram was performed and a Mynx closure device was successfully deployed achieving hemostasis.  The patient left lab in stable condition. Quay Burow. MD, [facility] 10/04/2020 8:38 AM   IR Fluoro Guide CV Line Right  Result Date: 10/01/2020 INDICATION: END-STAGE RENAL DISEASE, NO CURRENT ACCESS FOR DIALYSIS EXAM: ULTRASOUND FLUOROSCOPIC RIGHT IJ TEMPORARY DIALYSIS CATHETER [facility] CATHETER) MEDICATIONS: 1% lidocaine local ANESTHESIA/SEDATION: None. FLUOROSCOPY TIME:  Fluoroscopy Time: 0 minutes 36 seconds (29.8 mGy). COMPLICATIONS: None immediate. PROCEDURE: Informed written consent was obtained from the patient after a thorough discussion of the procedural risks, benefits and alternatives. All questions were addressed. Maximal Sterile Barrier Technique was utilized including caps, mask, sterile gowns, sterile gloves, sterile drape, hand hygiene and skin antiseptic. A timeout was performed prior to the initiation of the procedure. Under sterile conditions and local anesthesia, ultrasound micropuncture access performed of the right internal jugular vein. Images obtained for documentation of the  patent right internal jugular vein. Micro dilator advanced. Amplatz guidewire inserted. Tract dilatation performed to insert a 20 cm her catheter. Tip position SVC RA junction. Images obtained for documentation. Access secured with prolene sutures. Blood aspirated easily followed by saline and heparin flushes. Appropriate volume and strength of heparin instilled in all 3 lumens followed by external caps and a sterile dressing. No immediate complication. Patient tolerated the procedure well. IMPRESSION: Successful ultrasound and fluoroscopic right IJ temporary dialysis catheter (Mahurkar catheter). Access ready for use. Electronically Signed   By: Jerilynn Mages.  Shick M.D.   On: 10/01/2020 16:04   IR US Guide Vasc Access Right  Result Date: 10/01/2020 INDICATION: END-STAGE RENAL DISEASE, NO CURRENT ACCESS FOR DIALYSIS EXAM: ULTRASOUND FLUOROSCOPIC RIGHT IJ TEMPORARY DIALYSIS CATHETER [facility] CATHETER) MEDICATIONS: 1% lidocaine local ANESTHESIA/SEDATION: None. FLUOROSCOPY TIME:  Fluoroscopy Time: 0 minutes 36 seconds (29.8 mGy). COMPLICATIONS: None immediate. PROCEDURE: Informed written consent was obtained from the patient after a thorough discussion of the procedural risks, benefits and alternatives. All questions were addressed. Maximal Sterile Barrier Technique was utilized including caps, mask, sterile gowns, sterile gloves, sterile drape, hand hygiene and skin antiseptic. A timeout was performed prior to the initiation of the procedure. Under sterile conditions and local anesthesia, ultrasound micropuncture access performed of the right internal jugular vein. Images obtained for documentation of the patent right internal jugular vein. Micro dilator advanced. Amplatz guidewire inserted. Tract dilatation performed to insert a 20 cm her catheter. Tip position SVC RA junction. Images obtained for documentation. Access secured with prolene sutures. Blood aspirated easily followed by saline and heparin flushes.  Appropriate volume and strength of heparin instilled in all 3 lumens followed by external caps and a sterile dressing. No immediate complication. Patient tolerated the procedure well. IMPRESSION: Successful ultrasound and fluoroscopic right IJ temporary dialysis catheter (Mahurkar catheter). Access ready for use. Electronically Signed   By: Jerilynn Mages.  Shick M.D.   On: 10/01/2020 16:04   DG Chest Port 1 View  Result Date: 10/05/2020 CLINICAL DATA:  Dialysis catheter insertion EXAM: PORTABLE CHEST 1 VIEW COMPARISON:  09/30/2020 FINDINGS: Right-sided central venous catheter tip over the SVC. No pneumothorax. Cardiomegaly with vascular congestion and hazy pulmonary edema. At least moderate likely layering left pleural effusion with small right pleural effusion. Dense consolidation left lung base. Aortic atherosclerosis. IMPRESSION: 1. Right-sided central venous catheter tip over the SVC. No pneumothorax. 2. Cardiomegaly with vascular congestion and hazy pulmonary edema. Bilateral pleural effusions, at least moderate on the left and probably increased. Small  right pleural effusion. Worsening aeration of left base. Electronically Signed   By: Donavan Foil M.D.   On: 10/05/2020 17:00   DG Chest Portable 1 View  Result Date: 09/30/2020 CLINICAL DATA:  Status post thoracentesis. EXAM: PORTABLE CHEST 1 VIEW COMPARISON:  Earlier today at 6:49 a.m. FINDINGS: 11:19 a.m. Midline trachea. Mild cardiomegaly. Atherosclerosis in the transverse aorta. Decrease in small left pleural effusion. Probable tiny layering right pleural effusion. No pneumothorax. Mild congestive heart failure, improved. Persistent right and improved left base airspace disease. IMPRESSION: Decreased sided pleural effusion, without pneumothorax. Improved mild interstitial edema with left greater than right small bilateral pleural effusions and adjacent Airspace disease, likely atelectasis. Aortic Atherosclerosis (ICD10-I70.0). Electronically Signed   By:  Abigail Miyamoto M.D.   On: 09/30/2020 11:29   DG Chest Portable 1 View  Result Date: 09/30/2020 CLINICAL DATA:  Chest pain and shortness of breath EXAM: PORTABLE CHEST 1 VIEW COMPARISON:  11/30/2019 FINDINGS: Bilateral pleural effusion, large appearing on the left. Interstitial and hazy airspace density on both sides with cephalized blood flow. No pneumothorax. Partially obscured heart. IMPRESSION: CHF pattern with large left and small right pleural effusion. Electronically Signed   By: Monte Fantasia M.D.   On: 09/30/2020 07:16   DG Fluoro Guide CV Line-No Report  Result Date: 10/05/2020 Fluoroscopy was utilized by the requesting physician.  No radiographic interpretation.   ECHOCARDIOGRAM COMPLETE  Result Date: 09/30/2020    ECHOCARDIOGRAM REPORT   Patient Name:   Michael Trevino Date of Exam: 09/30/2020 Medical Rec #:  [id number]    Height:       69.0 in Accession #:    [id number]   Weight:       240.0 lb Date of Birth:  08-28-1941    BSA:          2.232 m Patient Age:    58 years     BP:           167/58 mmHg Patient Gender: M            HR:           74 bpm. Exam Location:  [facility] Procedure: 2D Echo Indications:     Dyspnea 786.09 / R06.00  History:         Patient has prior history of Echocardiogram examinations, most                  recent 09/24/2019. CHF; Risk Factors:Former Smoker,                  Dyslipidemia, Hypertension and Diabetes. Nonrheumatic aortic                  valve insufficiency.  Sonographer:     Leavy Cella RDCS (AE) Referring Phys:  [id number] Arnoldo Lenis Diagnosing Phys: Carlyle Dolly MD IMPRESSIONS  1. Left ventricular ejection fraction, by estimation, is 55 to 60%. The left ventricle has normal function. The left ventricle has no regional wall motion abnormalities. There is severe left ventricular hypertrophy. Left ventricular diastolic parameters  are indeterminate. Elevated left atrial pressure.  2. Right ventricular systolic function is normal. The right  ventricular size is normal.  3. A small pericardial effusion is present. The pericardial effusion is circumferential. There is no evidence of cardiac tamponade. Large pleural effusion in the left lateral region.  4. The mitral valve is normal in structure. Trivial mitral valve regurgitation. No evidence of mitral stenosis.  5. The aortic valve was  not well visualized. There is mild calcification of the aortic valve. There is mild thickening of the aortic valve. Aortic valve regurgitation is moderate. Moderate aortic valve stenosis.  6. The inferior vena cava is normal in size with greater than 50% respiratory variability, suggesting right atrial pressure of 3 mmHg. FINDINGS  Left Ventricle: Left ventricular ejection fraction, by estimation, is 55 to 60%. The left ventricle has normal function. The left ventricle has no regional wall motion abnormalities. The left ventricular internal cavity size was normal in size. There is  severe left ventricular hypertrophy. Left ventricular diastolic parameters are indeterminate. Elevated left atrial pressure. Right Ventricle: The right ventricular size is normal. No increase in right ventricular wall thickness. Right ventricular systolic function is normal. Left Atrium: Left atrial size was normal in size. Right Atrium: Right atrial size was normal in size. Pericardium: A small pericardial effusion is present. The pericardial effusion is circumferential. There is no evidence of cardiac tamponade. Mitral Valve: The mitral valve is normal in structure. There is mild thickening of the mitral valve leaflet(s). There is mild calcification of the mitral valve leaflet(s). Mild mitral annular calcification. Trivial mitral valve regurgitation. No evidence  of mitral valve stenosis. Tricuspid Valve: The tricuspid valve is not well visualized. Tricuspid valve regurgitation is mild . No evidence of tricuspid stenosis. Aortic Valve: The aortic valve was not well visualized. There is mild  calcification of the aortic valve. There is mild thickening of the aortic valve. There is mild aortic valve annular calcification. Aortic valve regurgitation is moderate. Aortic regurgitation PHT measures 349 msec. Moderate aortic stenosis is present. Aortic valve mean gradient measures 19.4 mmHg. Aortic valve peak gradient measures 34.7 mmHg. Aortic valve area, by VTI measures 1.30 cm. Pulmonic Valve: The pulmonic valve was not well visualized. Pulmonic valve regurgitation is not visualized. No evidence of pulmonic stenosis. Aorta: The aortic root is normal in size and structure. Pulmonary Artery: Indeterminate PASP, inadequate TR jet. Venous: The inferior vena cava is normal in size with greater than 50% respiratory variability, suggesting right atrial pressure of 3 mmHg. IAS/Shunts: No atrial level shunt detected by color flow Doppler. Additional Comments: There is a large pleural effusion in the left lateral region.  LEFT VENTRICLE PLAX 2D LVIDd:         5.34 cm  Diastology LVIDs:         2.37 cm  LV e' lateral:   3.59 cm/s LV PW:         2.03 cm  LV E/e' lateral: 34.3 LV IVS:        1.69 cm LVOT diam:     2.10 cm LV SV:         87 LV SV Index:   39 LVOT Area:     3.46 cm  RIGHT VENTRICLE RV S prime:     14.20 cm/s TAPSE (M-mode): 2.2 cm LEFT ATRIUM             Index       RIGHT ATRIUM          Index LA diam:        3.40 cm 1.52 cm/m  RA Area:     8.96 cm LA Vol (A2C):   70.3 ml 31.49 ml/m RA Volume:   17.80 ml 7.97 ml/m LA Vol (A4C):   47.8 ml 21.41 ml/m LA Biplane Vol: 58.7 ml 26.29 ml/m  AORTIC VALVE AV Area (Vmax):    1.29 cm AV Area (Vmean):   1.24 cm  AV Area (VTI):     1.30 cm AV Vmax:           294.66 cm/s AV Vmean:          207.077 cm/s AV VTI:            0.669 m AV Peak Grad:      34.7 mmHg AV Mean Grad:      19.4 mmHg LVOT Vmax:         109.33 cm/s LVOT Vmean:        73.863 cm/s LVOT VTI:          0.252 m LVOT/AV VTI ratio: 0.38 AI PHT:            349 msec  AORTA Ao Root diam: 3.10 cm MITRAL  VALVE MV Area (PHT): 3.23 cm     SHUNTS MV Decel Time: 235 msec     Systemic VTI:  0.25 m MV E velocity: 123.00 cm/s  Systemic Diam: 2.10 cm MV A velocity: 101.00 cm/s MV E/A ratio:  1.22 Carlyle Dolly MD Electronically signed by Carlyle Dolly MD Signature Date/Time: 09/30/2020/12:04:11 PM    Final (Updated)    US THORACENTESIS ASP PLEURAL SPACE W/IMG GUIDE  Result Date: 09/30/2020 INDICATION: Left pleural effusion CHF EXAM: ULTRASOUND GUIDED LEFT THORACENTESIS MEDICATIONS: 10 cc 1% lidocaine. COMPLICATIONS: None immediate. PROCEDURE: An ultrasound guided thoracentesis was thoroughly discussed with the patient and questions answered. The benefits, risks, alternatives and complications were also discussed. The patient understands and wishes to proceed with the procedure. Written consent was obtained. Ultrasound was performed to localize and mark an adequate pocket of fluid in the left chest. The area was then prepped and draped in the normal sterile fashion. 1% Lidocaine was used for local anesthesia. Under ultrasound guidance a 19 G Yueh catheter was introduced. Thoracentesis was performed. The catheter was removed and a dressing applied. FINDINGS: A total of approximately 1 liter of blood tinged fluid was removed. Samples were sent to the laboratory as requested by the clinical team. IMPRESSION: Successful ultrasound guided left thoracentesis yielding 1 liter of pleural fluid. No PTX post procedure CXR Read by Lavonia Drafts [facility] Electronically Signed   By: Abigail Miyamoto M.D.   On: 09/30/2020 11:30       Subjective: No complaints. Denies sob, cp, chills.   Discharge Exam: Vitals:   10/13/20 0306 10/13/20 0734  BP: (!) 140/48 (!) 141/46  Pulse: 68   Resp: 16   Temp: 98.5 F (36.9 C)   SpO2: 95%    Vitals:   10/12/20 2003 10/12/20 2331 10/13/20 0306 10/13/20 0734  BP: (!) 147/36 (!) 149/39 (!) 140/48 (!) 141/46  Pulse: 74 71 68   Resp: (!) 21 18 16    Temp: 98.4 F (36.9 C) 98.2 F  (36.8 C) 98.5 F (36.9 C)   TempSrc: Oral Oral Oral   SpO2: 100% 94% 95%   Weight:   94.3 kg   Height:        General: Pt is alert, awake, not in acute distress Cardiovascular: RRR, S1/S2 +, no rubs, no gallops Respiratory: CTA bilaterally, no wheezing, no rhonchi Abdominal: Soft, NT, ND, bowel sounds + Extremities: no edema, no cyanosis    The results of significant diagnostics from this hospitalization (including imaging, microbiology, ancillary and laboratory) are listed below for reference.     Microbiology: Recent Results (from the past 240 hour(s))  SARS Coronavirus 2 by RT PCR (hospital order, performed in [facility] hospital lab) Nasopharyngeal  Nasopharyngeal Swab     Status: None   Collection Time: 10/12/20  8:37 PM   Specimen: Nasopharyngeal Swab  Result Value Ref Range Status   SARS Coronavirus 2 NEGATIVE NEGATIVE Final    Comment: (NOTE) SARS-CoV-2 target nucleic acids are NOT DETECTED.  The SARS-CoV-2 RNA is generally detectable in upper and lower respiratory specimens during the acute phase of infection. The lowest concentration of SARS-CoV-2 viral copies this assay can detect is 250 copies / mL. A negative result does not preclude SARS-CoV-2 infection and should not be used as the sole basis for treatment or other patient management decisions.  A negative result may occur with improper specimen collection / handling, submission of specimen other than nasopharyngeal swab, presence of viral mutation(s) within the areas targeted by this assay, and inadequate number of viral copies (<250 copies / mL). A negative result must be combined with clinical observations, patient history, and epidemiological information.  Fact Sheet for Patients:   StrictlyIdeas.no  Fact Sheet for Healthcare Providers: BankingDealers.co.za  This test is not yet approved or  cleared by the [place] FDA and has been authorized for  detection and/or diagnosis of SARS-CoV-2 by FDA under an Emergency Use Authorization (EUA).  This EUA will remain in effect (meaning this test can be used) for the duration of the COVID-19 declaration under Section 564(b)(1) of the Act, 21 U.S.C. section 360bbb-3(b)(1), unless the authorization is terminated or revoked sooner.  Performed at [facility], [place] [address], [place], [place] [address]      Labs: BNP (last 3 results) Recent Labs    11/30/19 1110 09/30/20 0654  BNP 958.0* 8,588.5*   Basic Metabolic Panel: Recent Labs  Lab 10/09/20 0451 10/10/20 0039 10/11/20 0114 10/12/20 0101 10/13/20 0235  NA 134* 135 135 134* 137  K 4.0 3.5 3.5 3.4* 3.9  CL 97* 97* 98 96* 98  CO2 24 26 22 22 25   GLUCOSE 102* 129* 95 115* 106*  BUN 68* 38* 58* 73* 45*  CREATININE 8.45* 5.88* 7.55* 8.52* 6.20*  CALCIUM 7.4* 7.8* 7.7* 7.9* 7.7*  PHOS 4.6 3.8 4.6 5.6* 4.5   Liver Function Tests: Recent Labs  Lab 10/09/20 0451 10/10/20 0039 10/11/20 0114 10/12/20 0101 10/13/20 0235  ALBUMIN 2.1* 2.2* 2.2* 2.3* 2.3*   No results for input(s): LIPASE, AMYLASE in the last 168 hours. No results for input(s): AMMONIA in the last 168 hours. CBC: Recent Labs  Lab 10/07/20 0058 10/09/20 1344 10/11/20 0114 10/12/20 1317  WBC 8.0 6.4 6.9 6.2  NEUTROABS 5.5  --  3.6  --   HGB 8.9* 8.2* 8.8* 9.1*  HCT 26.5* 25.9* 26.7* 28.5*  MCV 89.2 92.2 89.3 92.5  PLT 149* 228 210 258   Cardiac Enzymes: No results for input(s): CKTOTAL, CKMB, CKMBINDEX, TROPONINI in the last 168 hours. BNP: Invalid input(s): POCBNP CBG: Recent Labs  Lab 10/11/20 2110 10/12/20 0600 10/12/20 1125 10/12/20 2109 10/13/20 0604  GLUCAP 191* 101* 196* 182* 87   D-Dimer No results for input(s): DDIMER in the last 72 hours. Hgb A1c No results for input(s): HGBA1C in the last 72 hours. Lipid Profile No results for input(s): CHOL, HDL, LDLCALC, TRIG, CHOLHDL, LDLDIRECT in the last 72 hours. Thyroid  function studies No results for input(s): TSH, T4TOTAL, T3FREE, THYROIDAB in the last 72 hours.  Invalid input(s): FREET3 Anemia work up No results for input(s): VITAMINB12, FOLATE, FERRITIN, TIBC, IRON, RETICCTPCT in the last 72 hours. Urinalysis    Component Value Date/Time  COLORURINE YELLOW 11/30/2019 1147   APPEARANCEUR HAZY (A) 11/30/2019 1147   LABSPEC 1.010 11/30/2019 1147   PHURINE 5.0 11/30/2019 1147   GLUCOSEU >=500 (A) 11/30/2019 1147   HGBUR SMALL (A) 11/30/2019 1147   BILIRUBINUR NEGATIVE 11/30/2019 1147   KETONESUR NEGATIVE 11/30/2019 1147   PROTEINUR >=300 (A) 11/30/2019 1147   UROBILINOGEN 1.0 02/23/2013 1136   NITRITE NEGATIVE 11/30/2019 1147   LEUKOCYTESUR NEGATIVE 11/30/2019 1147   Sepsis Labs Invalid input(s): PROCALCITONIN,  WBC,  LACTICIDVEN Microbiology Recent Results (from the past 240 hour(s))  SARS Coronavirus 2 by RT PCR (hospital order, performed in [place] hospital lab) Nasopharyngeal Nasopharyngeal Swab     Status: None   Collection Time: 10/12/20  8:37 PM   Specimen: Nasopharyngeal Swab  Result Value Ref Range Status   SARS Coronavirus 2 NEGATIVE NEGATIVE Final    Comment: (NOTE) SARS-CoV-2 target nucleic acids are NOT DETECTED.  The SARS-CoV-2 RNA is generally detectable in upper and lower respiratory specimens during the acute phase of infection. The lowest concentration of SARS-CoV-2 viral copies this assay can detect is 250 copies / mL. A negative result does not preclude SARS-CoV-2 infection and should not be used as the sole basis for treatment or other patient management decisions.  A negative result may occur with improper specimen collection / handling, submission of specimen other than nasopharyngeal swab, presence of viral mutation(s) within the areas targeted by this assay, and inadequate number of viral copies (<250 copies / mL). A negative result must be combined with clinical observations, patient history, and  epidemiological information.  Fact Sheet for Patients:   StrictlyIdeas.no  Fact Sheet for Healthcare Providers: BankingDealers.co.za  This test is not yet approved or  cleared by the [place] FDA and has been authorized for detection and/or diagnosis of SARS-CoV-2 by FDA under an Emergency Use Authorization (EUA).  This EUA will remain in effect (meaning this test can be used) for the duration of the COVID-19 declaration under Section 564(b)(1) of the Act, 21 U.S.C. section 360bbb-3(b)(1), unless the authorization is terminated or revoked sooner.  Performed at [facility], [place] [address] [place], [place] [address]      Time coordinating discharge: Over 30 minutes  SIGNED:   Nolberto Hanlon, MD  [facility] 10/13/2020, 8:56 AM Pager   If 7PM-7AM, please contact night-coverage [email or web address] Password [email or web address]

## 2020-10-13 NOTE — Progress Notes (Signed)
[facility] here to transport pt. Belongings given to [facility]. All questions answered. Report given to receiving facility.

## 2020-10-13 NOTE — Progress Notes (Signed)
Navigator has faxed discharge records to outpatient HD clinic/[facility] and notified them of discharge to SNF today and new start tomorrow.   Alphonzo Cruise, [place] [facility] Navigator [phone]

## 2020-10-13 NOTE — NC FL2 (Signed)
[place] MEDICAID FL2 LEVEL OF CARE SCREENING TOOL     IDENTIFICATION  Patient Name: Michael Trevino Birthdate: 05/26/1941 Sex: male Admission Date (Current Location): 09/30/2020  [facility] and [place] Number:  [employer] and Address:  [facility] [place], [place] [address] [place], [place] [address]      Provider Number: [id number]  Attending Physician Name and Address:  Nolberto Hanlon, MD  Relative Name and Phone Number:  Emmitt Matthews [phone]    Current Level of Care: Hospital Recommended Level of Care: [facility] [place] Prior Approval Number:    Date Approved/Denied:   PASRR Number: [id number]  Discharge Plan: SNF    Current Diagnoses: Patient Active Problem List   Diagnosis Date Noted  . Acute exacerbation of CHF (congestive heart failure) ([place]) 09/30/2020  . CKD stage 5 due to type 2 diabetes mellitus ([place]) 09/30/2020  . Pleural effusion--Lt > Rt 09/30/2020  . Acute on chronic respiratory failure with hypoxia ([place]) 09/30/2020  . Acute respiratory failure with hypoxia ([place]) 09/30/2020  . Cellulitis and abscess of lower extremity 11/30/2019  . CHF (congestive heart failure) ([place]) 09/30/2019  . Fluid overload 09/24/2019  . CKD stage 4 due to type 2 diabetes mellitus ([place]) 01/09/2018  . Essential hypertension, benign 09/11/2017  . Class 2 severe obesity due to excess calories with serious comorbidity and body mass index (BMI) of 37.0 to 37.9 in adult ([place]) 09/11/2017  . Nonrheumatic aortic valve insufficiency 05/03/2017  . Uncontrolled type 2 diabetes with stage 4 chronic kidney disease ([place]) 05/03/2017  . Non-ST elevation (NSTEMI) myocardial infarction ([place])   . Arthritis   . Erectile dysfunction 04/11/2013  . Mixed hyperlipidemia 04/11/2013  . Hypothyroidism 02/08/2011  . Illiterate 02/08/2011  . CAD (coronary artery disease) 02/08/2011    Orientation RESPIRATION BLADDER Height & Weight     Self, Place  O2 ([place] 2 Liters)  Incontinent, External catheter Weight: 207 lb 14.3 oz (94.3 kg) Height:  5\' 9"  (175.3 cm)  BEHAVIORAL SYMPTOMS/MOOD NEUROLOGICAL BOWEL NUTRITION STATUS      Continent Diet (see dc summary)  AMBULATORY STATUS COMMUNICATION OF NEEDS Skin   Limited Assist Verbally Normal                       Personal Care Assistance Level of Assistance  Bathing, Feeding, Dressing Bathing Assistance: Limited assistance Feeding assistance: Independent Dressing Assistance: Limited assistance     Functional Limitations Info  Sight, Hearing, Speech Sight Info: Impaired Hearing Info: Adequate Speech Info: Adequate    SPECIAL CARE FACTORS FREQUENCY  OT (By licensed OT), PT (By licensed PT)     PT Frequency: 5x week OT Frequency: 5x week            Contractures Contractures Info: Not present    Additional Factors Info  Code Status, Allergies, Psychotropic, Insulin Sliding Scale Code Status Info: Full Allergies Info: Zocor Psychotropic Info: traZODone (DESYREL) tablet 50 mg at bedtime as needed Insulin Sliding Scale Info: insulin aspart (novoLOG) injection 0-5 Units daily at bedtime, insulin aspart (novoLOG) injection 0-9 Units three times daily with meals,insulin detemir (LEVEMIR) injection 25 Units daily at bedtime,       Current Medications (10/13/2020):  This is the current hospital active medication list Current Facility-Administered Medications  Medication Dose Route Frequency Provider Last Rate Last Admin  . 0.9 %  sodium chloride infusion (Manually program via Guardrails IV Fluids)   Intravenous Once Baglia, Corrina, PA-C      .  0.9 %  sodium chloride infusion (Manually program via Guardrails IV Fluids)   Intravenous Once Baglia, Corrina, PA-C      . acetaminophen (TYLENOL) tablet 650 mg  650 mg Oral Q4H PRN Baglia, Corrina, PA-C      . amLODipine (NORVASC) tablet 10 mg  10 mg Oral Daily Baglia, Corrina, PA-C   10 mg at 10/13/20 0818  . ascorbic acid (VITAMIN C) tablet 500 mg  500  mg Oral Daily Baglia, Corrina, PA-C   500 mg at 10/13/20 0819  . aspirin chewable tablet 81 mg  81 mg Oral Daily Baglia, Corrina, PA-C   81 mg at 10/13/20 0818  . atorvastatin (LIPITOR) tablet 80 mg  80 mg Oral Daily Baglia, Corrina, PA-C   80 mg at 10/13/20 0818  . bisacodyl (DULCOLAX) suppository 10 mg  10 mg Rectal Daily PRN Baglia, Corrina, PA-C   10 mg at 10/12/20 1746  . calcitRIOL (ROCALTROL) capsule 0.5 mcg  0.5 mcg Oral Daily Baglia, Corrina, PA-C   0.5 mcg at 10/13/20 0818  . Chlorhexidine Gluconate Cloth 2 % PADS 6 each  6 each Topical Daily Baglia, Corrina, PA-C   6 each at 10/10/20 1004  . chlorproMAZINE (THORAZINE) tablet 10 mg  10 mg Oral QID PRN Swayze, Ava, DO   10 mg at 10/13/20 0818  . cholecalciferol (VITAMIN D3) tablet 1,000 Units  1,000 Units Oral Daily Baglia, Corrina, PA-C   1,000 Units at 10/13/20 0818  . [START ON 10/14/2020] Darbepoetin Alfa (ARANESP) injection 40 mcg  40 mcg Intravenous Q Thu-HD Coladonato, Broadus John, MD      . feeding supplement (NEPRO CARB STEADY) liquid 237 mL  237 mL Oral BID BM Swayze, Ava, DO   237 mL at 10/13/20 0819  . heparin injection 5,000 Units  5,000 Units Subcutaneous Q8H Baglia, Corrina, PA-C   5,000 Units at 10/13/20 0609  . [START ON 10/14/2020] heparin sodium (porcine) injection 3,800 Units  3,800 Units Intravenous Q T,Th,Sa-HD Donato Heinz, MD   3,800 Units at 10/12/20 1534  . hydrALAZINE (APRESOLINE) tablet 50 mg  50 mg Oral TID Baglia, Corrina, PA-C   50 mg at 10/13/20 0818  . insulin aspart (novoLOG) injection 0-5 Units  0-5 Units Subcutaneous QHS Baglia, Corrina, PA-C   2 Units at 10/05/20 2118  . insulin aspart (novoLOG) injection 0-9 Units  0-9 Units Subcutaneous TID WC Baglia, Corrina, PA-C   1 Units at 10/11/20 1730  . insulin detemir (LEVEMIR) injection 25 Units  25 Units Subcutaneous QHS Baglia, Corrina, PA-C   25 Units at 10/12/20 2113  . levothyroxine (SYNTHROID) tablet 175 mcg  175 mcg Oral QAC breakfast Baglia, Corrina,  PA-C   175 mcg at 10/13/20 6629  . lidocaine (XYLOCAINE) 1 % (with pres) injection    PRN Greggory Keen, MD   5 mL at 10/01/20 1537  . metoprolol tartrate (LOPRESSOR) tablet 100 mg  100 mg Oral BID Baglia, Corrina, PA-C   100 mg at 10/13/20 0818  . morphine 2 MG/ML injection 2 mg  2 mg Intravenous Q1H PRN Baglia, Corrina, PA-C      . multivitamin (RENA-VIT) tablet 1 tablet  1 tablet Oral QHS Swayze, Ava, DO   1 tablet at 10/12/20 2113  . nitroGLYCERIN (NITROSTAT) SL tablet 0.4 mg  0.4 mg Sublingual Q5 min PRN Baglia, Corrina, PA-C      . ondansetron (ZOFRAN) injection 4 mg  4 mg Intravenous Q6H PRN Baglia, Corrina, PA-C   4 mg at 10/12/20 1024  .  ondansetron (ZOFRAN) tablet 4 mg  4 mg Oral Q6H PRN Baglia, Corrina, PA-C      . oxyCODONE-acetaminophen (PERCOCET/ROXICET) 5-325 MG per tablet 1 tablet  1 tablet Oral Q4H PRN Baglia, Corrina, PA-C   1 tablet at 10/08/20 1446  . polyethylene glycol (MIRALAX / GLYCOLAX) packet 17 g  17 g Oral Daily PRN Baglia, Corrina, PA-C      . sodium chloride flush (NS) 0.9 % injection 3 mL  3 mL Intravenous Q12H Baglia, Corrina, PA-C   3 mL at 10/12/20 1000  . sodium chloride flush (NS) 0.9 % injection 3 mL  3 mL Intravenous Q12H Baglia, Corrina, PA-C   3 mL at 10/12/20 1000  . sodium chloride flush (NS) 0.9 % injection 3 mL  3 mL Intravenous PRN Baglia, Corrina, PA-C      . traZODone (DESYREL) tablet 50 mg  50 mg Oral QHS PRN Baglia, Corrina, PA-C   50 mg at 10/11/20 2153     Discharge Medications: Please see discharge summary for a list of discharge medications.  Relevant Imaging Results:  Relevant Lab Results:   Additional Information SSN [id number] PT has HD T,TH,S @[facility], chair time 10:45, FIRST APPT PT NEEDS TO ARRIVE BY 10:15 AM.  Joesph Fillers Mariaha Ellington, LCSWA

## 2020-10-14 DIAGNOSIS — Z79899 Other long term (current) drug therapy: Secondary | ICD-10-CM | POA: Diagnosis not present

## 2020-10-14 DIAGNOSIS — N185 Chronic kidney disease, stage 5: Secondary | ICD-10-CM | POA: Diagnosis not present

## 2020-10-14 DIAGNOSIS — Z992 Dependence on renal dialysis: Secondary | ICD-10-CM | POA: Diagnosis not present

## 2020-10-14 DIAGNOSIS — N186 End stage renal disease: Secondary | ICD-10-CM | POA: Diagnosis not present

## 2020-10-14 DIAGNOSIS — I5021 Acute systolic (congestive) heart failure: Secondary | ICD-10-CM | POA: Diagnosis not present

## 2020-10-14 DIAGNOSIS — E038 Other specified hypothyroidism: Secondary | ICD-10-CM | POA: Diagnosis not present

## 2020-10-14 DIAGNOSIS — J9691 Respiratory failure, unspecified with hypoxia: Secondary | ICD-10-CM | POA: Diagnosis not present

## 2020-10-16 DIAGNOSIS — Z992 Dependence on renal dialysis: Secondary | ICD-10-CM | POA: Diagnosis not present

## 2020-10-16 DIAGNOSIS — N186 End stage renal disease: Secondary | ICD-10-CM | POA: Diagnosis not present

## 2020-10-19 DIAGNOSIS — Z992 Dependence on renal dialysis: Secondary | ICD-10-CM | POA: Diagnosis not present

## 2020-10-19 DIAGNOSIS — N186 End stage renal disease: Secondary | ICD-10-CM | POA: Diagnosis not present

## 2020-10-21 DIAGNOSIS — N184 Chronic kidney disease, stage 4 (severe): Secondary | ICD-10-CM | POA: Diagnosis not present

## 2020-10-21 DIAGNOSIS — D631 Anemia in chronic kidney disease: Secondary | ICD-10-CM | POA: Diagnosis not present

## 2020-10-21 DIAGNOSIS — E1122 Type 2 diabetes mellitus with diabetic chronic kidney disease: Secondary | ICD-10-CM | POA: Diagnosis not present

## 2020-10-22 ENCOUNTER — Telehealth: Payer: Medicare Other

## 2020-10-22 DIAGNOSIS — Z992 Dependence on renal dialysis: Secondary | ICD-10-CM | POA: Diagnosis not present

## 2020-10-22 DIAGNOSIS — N186 End stage renal disease: Secondary | ICD-10-CM | POA: Diagnosis not present

## 2020-10-22 DIAGNOSIS — N2581 Secondary hyperparathyroidism of renal origin: Secondary | ICD-10-CM | POA: Diagnosis not present

## 2020-10-23 ENCOUNTER — Encounter (HOSPITAL_COMMUNITY): Payer: Self-pay

## 2020-10-23 ENCOUNTER — Emergency Department (HOSPITAL_COMMUNITY): Payer: Medicare Other

## 2020-10-23 ENCOUNTER — Inpatient Hospital Stay (HOSPITAL_COMMUNITY): Payer: Medicare Other

## 2020-10-23 ENCOUNTER — Inpatient Hospital Stay (HOSPITAL_COMMUNITY)
Admission: EM | Admit: 2020-10-23 | Discharge: 2020-11-13 | DRG: 871 | Disposition: E | Payer: Medicare Other | Source: Skilled Nursing Facility | Attending: Internal Medicine | Admitting: Internal Medicine

## 2020-10-23 DIAGNOSIS — I132 Hypertensive heart and chronic kidney disease with heart failure and with stage 5 chronic kidney disease, or end stage renal disease: Secondary | ICD-10-CM | POA: Diagnosis present

## 2020-10-23 DIAGNOSIS — Z515 Encounter for palliative care: Secondary | ICD-10-CM | POA: Diagnosis not present

## 2020-10-23 DIAGNOSIS — Z66 Do not resuscitate: Secondary | ICD-10-CM | POA: Diagnosis not present

## 2020-10-23 DIAGNOSIS — J811 Chronic pulmonary edema: Secondary | ICD-10-CM | POA: Diagnosis not present

## 2020-10-23 DIAGNOSIS — I10 Essential (primary) hypertension: Secondary | ICD-10-CM | POA: Diagnosis not present

## 2020-10-23 DIAGNOSIS — Z8249 Family history of ischemic heart disease and other diseases of the circulatory system: Secondary | ICD-10-CM

## 2020-10-23 DIAGNOSIS — Z992 Dependence on renal dialysis: Secondary | ICD-10-CM

## 2020-10-23 DIAGNOSIS — I509 Heart failure, unspecified: Secondary | ICD-10-CM

## 2020-10-23 DIAGNOSIS — A4152 Sepsis due to Pseudomonas: Secondary | ICD-10-CM | POA: Diagnosis not present

## 2020-10-23 DIAGNOSIS — N186 End stage renal disease: Secondary | ICD-10-CM | POA: Diagnosis not present

## 2020-10-23 DIAGNOSIS — R0689 Other abnormalities of breathing: Secondary | ICD-10-CM | POA: Diagnosis not present

## 2020-10-23 DIAGNOSIS — Z841 Family history of disorders of kidney and ureter: Secondary | ICD-10-CM

## 2020-10-23 DIAGNOSIS — N184 Chronic kidney disease, stage 4 (severe): Secondary | ICD-10-CM | POA: Diagnosis not present

## 2020-10-23 DIAGNOSIS — I5032 Chronic diastolic (congestive) heart failure: Secondary | ICD-10-CM | POA: Diagnosis not present

## 2020-10-23 DIAGNOSIS — J9600 Acute respiratory failure, unspecified whether with hypoxia or hypercapnia: Secondary | ICD-10-CM

## 2020-10-23 DIAGNOSIS — E1165 Type 2 diabetes mellitus with hyperglycemia: Secondary | ICD-10-CM | POA: Diagnosis not present

## 2020-10-23 DIAGNOSIS — E871 Hypo-osmolality and hyponatremia: Secondary | ICD-10-CM | POA: Diagnosis not present

## 2020-10-23 DIAGNOSIS — M79651 Pain in right thigh: Secondary | ICD-10-CM | POA: Diagnosis not present

## 2020-10-23 DIAGNOSIS — J9811 Atelectasis: Secondary | ICD-10-CM | POA: Diagnosis not present

## 2020-10-23 DIAGNOSIS — R319 Hematuria, unspecified: Secondary | ICD-10-CM | POA: Diagnosis not present

## 2020-10-23 DIAGNOSIS — G9340 Encephalopathy, unspecified: Secondary | ICD-10-CM | POA: Diagnosis not present

## 2020-10-23 DIAGNOSIS — M25559 Pain in unspecified hip: Secondary | ICD-10-CM | POA: Diagnosis not present

## 2020-10-23 DIAGNOSIS — B965 Pseudomonas (aeruginosa) (mallei) (pseudomallei) as the cause of diseases classified elsewhere: Secondary | ICD-10-CM | POA: Diagnosis not present

## 2020-10-23 DIAGNOSIS — R6520 Severe sepsis without septic shock: Secondary | ICD-10-CM | POA: Diagnosis present

## 2020-10-23 DIAGNOSIS — K7689 Other specified diseases of liver: Secondary | ICD-10-CM | POA: Diagnosis not present

## 2020-10-23 DIAGNOSIS — M25551 Pain in right hip: Secondary | ICD-10-CM | POA: Diagnosis not present

## 2020-10-23 DIAGNOSIS — I252 Old myocardial infarction: Secondary | ICD-10-CM

## 2020-10-23 DIAGNOSIS — F05 Delirium due to known physiological condition: Secondary | ICD-10-CM | POA: Diagnosis present

## 2020-10-23 DIAGNOSIS — Z833 Family history of diabetes mellitus: Secondary | ICD-10-CM

## 2020-10-23 DIAGNOSIS — Z79899 Other long term (current) drug therapy: Secondary | ICD-10-CM

## 2020-10-23 DIAGNOSIS — Z20822 Contact with and (suspected) exposure to covid-19: Secondary | ICD-10-CM | POA: Diagnosis present

## 2020-10-23 DIAGNOSIS — R339 Retention of urine, unspecified: Secondary | ICD-10-CM | POA: Diagnosis present

## 2020-10-23 DIAGNOSIS — M79661 Pain in right lower leg: Secondary | ICD-10-CM | POA: Diagnosis not present

## 2020-10-23 DIAGNOSIS — Z79890 Hormone replacement therapy (postmenopausal): Secondary | ICD-10-CM

## 2020-10-23 DIAGNOSIS — I4819 Other persistent atrial fibrillation: Secondary | ICD-10-CM | POA: Diagnosis not present

## 2020-10-23 DIAGNOSIS — N2581 Secondary hyperparathyroidism of renal origin: Secondary | ICD-10-CM | POA: Diagnosis not present

## 2020-10-23 DIAGNOSIS — Z7189 Other specified counseling: Secondary | ICD-10-CM | POA: Diagnosis not present

## 2020-10-23 DIAGNOSIS — G9341 Metabolic encephalopathy: Secondary | ICD-10-CM | POA: Diagnosis not present

## 2020-10-23 DIAGNOSIS — E039 Hypothyroidism, unspecified: Secondary | ICD-10-CM | POA: Diagnosis not present

## 2020-10-23 DIAGNOSIS — J90 Pleural effusion, not elsewhere classified: Secondary | ICD-10-CM | POA: Diagnosis not present

## 2020-10-23 DIAGNOSIS — E1122 Type 2 diabetes mellitus with diabetic chronic kidney disease: Secondary | ICD-10-CM

## 2020-10-23 DIAGNOSIS — I959 Hypotension, unspecified: Secondary | ICD-10-CM | POA: Diagnosis not present

## 2020-10-23 DIAGNOSIS — Z87441 Personal history of nephrotic syndrome: Secondary | ICD-10-CM

## 2020-10-23 DIAGNOSIS — N185 Chronic kidney disease, stage 5: Secondary | ICD-10-CM | POA: Diagnosis not present

## 2020-10-23 DIAGNOSIS — Z8673 Personal history of transient ischemic attack (TIA), and cerebral infarction without residual deficits: Secondary | ICD-10-CM

## 2020-10-23 DIAGNOSIS — IMO0002 Reserved for concepts with insufficient information to code with codable children: Secondary | ICD-10-CM | POA: Diagnosis present

## 2020-10-23 DIAGNOSIS — I2510 Atherosclerotic heart disease of native coronary artery without angina pectoris: Secondary | ICD-10-CM | POA: Diagnosis not present

## 2020-10-23 DIAGNOSIS — M25561 Pain in right knee: Secondary | ICD-10-CM | POA: Diagnosis not present

## 2020-10-23 DIAGNOSIS — E875 Hyperkalemia: Secondary | ICD-10-CM | POA: Diagnosis not present

## 2020-10-23 DIAGNOSIS — N390 Urinary tract infection, site not specified: Secondary | ICD-10-CM | POA: Diagnosis not present

## 2020-10-23 DIAGNOSIS — J9690 Respiratory failure, unspecified, unspecified whether with hypoxia or hypercapnia: Secondary | ICD-10-CM | POA: Diagnosis not present

## 2020-10-23 DIAGNOSIS — R338 Other retention of urine: Secondary | ICD-10-CM

## 2020-10-23 DIAGNOSIS — R29818 Other symptoms and signs involving the nervous system: Secondary | ICD-10-CM | POA: Diagnosis not present

## 2020-10-23 DIAGNOSIS — E669 Obesity, unspecified: Secondary | ICD-10-CM | POA: Diagnosis present

## 2020-10-23 DIAGNOSIS — I517 Cardiomegaly: Secondary | ICD-10-CM | POA: Diagnosis not present

## 2020-10-23 DIAGNOSIS — Z7982 Long term (current) use of aspirin: Secondary | ICD-10-CM

## 2020-10-23 DIAGNOSIS — K5730 Diverticulosis of large intestine without perforation or abscess without bleeding: Secondary | ICD-10-CM | POA: Diagnosis not present

## 2020-10-23 DIAGNOSIS — Z6830 Body mass index (BMI) 30.0-30.9, adult: Secondary | ICD-10-CM

## 2020-10-23 DIAGNOSIS — E8889 Other specified metabolic disorders: Secondary | ICD-10-CM | POA: Diagnosis present

## 2020-10-23 DIAGNOSIS — F0390 Unspecified dementia without behavioral disturbance: Secondary | ICD-10-CM | POA: Diagnosis present

## 2020-10-23 DIAGNOSIS — A419 Sepsis, unspecified organism: Secondary | ICD-10-CM

## 2020-10-23 DIAGNOSIS — I5022 Chronic systolic (congestive) heart failure: Secondary | ICD-10-CM

## 2020-10-23 DIAGNOSIS — D649 Anemia, unspecified: Secondary | ICD-10-CM | POA: Diagnosis present

## 2020-10-23 DIAGNOSIS — R0902 Hypoxemia: Secondary | ICD-10-CM

## 2020-10-23 DIAGNOSIS — E8809 Other disorders of plasma-protein metabolism, not elsewhere classified: Secondary | ICD-10-CM | POA: Diagnosis present

## 2020-10-23 DIAGNOSIS — R52 Pain, unspecified: Secondary | ICD-10-CM | POA: Diagnosis not present

## 2020-10-23 DIAGNOSIS — N1330 Unspecified hydronephrosis: Secondary | ICD-10-CM | POA: Diagnosis not present

## 2020-10-23 DIAGNOSIS — R4020 Unspecified coma: Secondary | ICD-10-CM | POA: Diagnosis not present

## 2020-10-23 DIAGNOSIS — Z794 Long term (current) use of insulin: Secondary | ICD-10-CM

## 2020-10-23 DIAGNOSIS — Z87891 Personal history of nicotine dependence: Secondary | ICD-10-CM

## 2020-10-23 DIAGNOSIS — Z823 Family history of stroke: Secondary | ICD-10-CM

## 2020-10-23 HISTORY — DX: Nonrheumatic aortic (valve) stenosis: I35.0

## 2020-10-23 HISTORY — DX: Disorientation, unspecified: R41.0

## 2020-10-23 HISTORY — DX: Atherosclerotic heart disease of native coronary artery without angina pectoris: I25.10

## 2020-10-23 HISTORY — DX: End stage renal disease: N18.6

## 2020-10-23 HISTORY — DX: Other disorders of plasma-protein metabolism, not elsewhere classified: E88.09

## 2020-10-23 HISTORY — DX: Unspecified dementia, unspecified severity, without behavioral disturbance, psychotic disturbance, mood disturbance, and anxiety: F03.90

## 2020-10-23 HISTORY — DX: Pleural effusion, not elsewhere classified: J90

## 2020-10-23 HISTORY — DX: Anemia, unspecified: D64.9

## 2020-10-23 HISTORY — DX: Chronic diastolic (congestive) heart failure: I50.32

## 2020-10-23 HISTORY — DX: Non-ST elevation (NSTEMI) myocardial infarction: I21.4

## 2020-10-23 LAB — COMPREHENSIVE METABOLIC PANEL
ALT: 29 U/L (ref 0–44)
AST: 43 U/L — ABNORMAL HIGH (ref 15–41)
Albumin: 2.7 g/dL — ABNORMAL LOW (ref 3.5–5.0)
Alkaline Phosphatase: 75 U/L (ref 38–126)
Anion gap: 18 — ABNORMAL HIGH (ref 5–15)
BUN: 79 mg/dL — ABNORMAL HIGH (ref 8–23)
CO2: 22 mmol/L (ref 22–32)
Calcium: 7 mg/dL — ABNORMAL LOW (ref 8.9–10.3)
Chloride: 92 mmol/L — ABNORMAL LOW (ref 98–111)
Creatinine, Ser: 12.11 mg/dL — ABNORMAL HIGH (ref 0.61–1.24)
GFR, Estimated: 4 mL/min — ABNORMAL LOW (ref >60)
Glucose, Bld: 120 mg/dL — ABNORMAL HIGH (ref 70–99)
Potassium: 5.2 mmol/L — ABNORMAL HIGH (ref 3.5–5.1)
Sodium: 132 mmol/L — ABNORMAL LOW (ref 135–145)
Total Bilirubin: 0.7 mg/dL (ref 0.3–1.2)
Total Protein: 7.8 g/dL (ref 6.5–8.1)

## 2020-10-23 LAB — CBC WITH DIFFERENTIAL/PLATELET
Abs Immature Granulocytes: 0.62 10*3/uL — ABNORMAL HIGH (ref 0.00–0.07)
Basophils Absolute: 0.1 10*3/uL (ref 0.0–0.1)
Basophils Relative: 0 %
Eosinophils Absolute: 0 10*3/uL (ref 0.0–0.5)
Eosinophils Relative: 0 %
HCT: 31.2 % — ABNORMAL LOW (ref 39.0–52.0)
Hemoglobin: 9.8 g/dL — ABNORMAL LOW (ref 13.0–17.0)
Immature Granulocytes: 2 %
Lymphocytes Relative: 6 %
Lymphs Abs: 1.7 10*3/uL (ref 0.7–4.0)
MCH: 29 pg (ref 26.0–34.0)
MCHC: 31.4 g/dL (ref 30.0–36.0)
MCV: 92.3 fL (ref 80.0–100.0)
Monocytes Absolute: 1.9 10*3/uL — ABNORMAL HIGH (ref 0.1–1.0)
Monocytes Relative: 7 %
Neutro Abs: 23.6 10*3/uL — ABNORMAL HIGH (ref 1.7–7.7)
Neutrophils Relative %: 85 %
Platelets: 268 10*3/uL (ref 150–400)
RBC: 3.38 MIL/uL — ABNORMAL LOW (ref 4.22–5.81)
RDW: 14.3 % (ref 11.5–15.5)
WBC: 27.8 10*3/uL — ABNORMAL HIGH (ref 4.0–10.5)
nRBC: 0 % (ref 0.0–0.2)

## 2020-10-23 LAB — URINALYSIS, ROUTINE W REFLEX MICROSCOPIC
Bilirubin Urine: NEGATIVE
Glucose, UA: 50 mg/dL — AB
Ketones, ur: NEGATIVE mg/dL
Nitrite: NEGATIVE
Protein, ur: >=300 mg/dL — AB
Specific Gravity, Urine: 1.015 (ref 1.005–1.030)
pH: 6 (ref 5.0–8.0)

## 2020-10-23 LAB — APTT: aPTT: 35 seconds (ref 24–36)

## 2020-10-23 LAB — GLUCOSE, CAPILLARY: Glucose-Capillary: 102 mg/dL — ABNORMAL HIGH (ref 70–99)

## 2020-10-23 LAB — RESP PANEL BY RT-PCR (FLU A&B, COVID) ARPGX2
Influenza A by PCR: NEGATIVE
Influenza B by PCR: NEGATIVE
SARS Coronavirus 2 by RT PCR: NEGATIVE

## 2020-10-23 LAB — PROTIME-INR
INR: 1.2 (ref 0.8–1.2)
Prothrombin Time: 14.8 seconds (ref 11.4–15.2)

## 2020-10-23 LAB — LACTIC ACID, PLASMA
Lactic Acid, Venous: 1.2 mmol/L (ref 0.5–1.9)
Lactic Acid, Venous: 1.2 mmol/L (ref 0.5–1.9)

## 2020-10-23 MED ORDER — BISACODYL 10 MG RE SUPP: 10.0000 mg | Freq: Every day | RECTAL | Status: DC | PRN

## 2020-10-23 MED ORDER — ATORVASTATIN CALCIUM 40 MG PO TABS
80.0000 mg | ORAL_TABLET | Freq: Every day | ORAL | Status: DC
  Administered 2020-10-24 – 2020-10-28 (×5): 80 mg via ORAL
  Filled 2020-10-23 (×6): qty 2

## 2020-10-23 MED ORDER — INSULIN ASPART 100 UNIT/ML ~~LOC~~ SOLN
0.0000 [IU] | Freq: Three times a day (TID) | SUBCUTANEOUS | Status: DC
  Administered 2020-10-24 – 2020-10-28 (×6): 1 [IU] via SUBCUTANEOUS
  Administered 2020-10-29: 08:00:00 2 [IU] via SUBCUTANEOUS

## 2020-10-23 MED ORDER — SODIUM ZIRCONIUM CYCLOSILICATE 5 G PO PACK
10.0000 g | PACK | Freq: Once | ORAL | Status: AC
  Administered 2020-10-23: 17:00:00 10 g via ORAL
  Filled 2020-10-23: qty 2

## 2020-10-23 MED ORDER — SODIUM CHLORIDE 0.9 % IV SOLN: 1.0000 g | Freq: Once | INTRAVENOUS | Status: DC

## 2020-10-23 MED ORDER — METHOCARBAMOL 1000 MG/10ML IJ SOLN
500.0000 mg | Freq: Four times a day (QID) | INTRAVENOUS | Status: DC | PRN
  Filled 2020-10-23: qty 5

## 2020-10-23 MED ORDER — VANCOMYCIN HCL 1500 MG/300ML IV SOLN
1500.0000 mg | Freq: Once | INTRAVENOUS | Status: AC
  Administered 2020-10-23: 17:00:00 1500 mg via INTRAVENOUS
  Filled 2020-10-23: qty 300

## 2020-10-23 MED ORDER — METOPROLOL TARTRATE 50 MG PO TABS
100.0000 mg | ORAL_TABLET | Freq: Two times a day (BID) | ORAL | Status: DC
  Administered 2020-10-24 – 2020-10-27 (×7): 100 mg via ORAL
  Filled 2020-10-23 (×8): qty 2

## 2020-10-23 MED ORDER — CALCITRIOL 0.25 MCG PO CAPS
0.5000 ug | ORAL_CAPSULE | Freq: Every day | ORAL | Status: DC
  Administered 2020-10-24 – 2020-10-28 (×5): 0.5 ug via ORAL
  Filled 2020-10-23 (×6): qty 2

## 2020-10-23 MED ORDER — LEVOTHYROXINE SODIUM 75 MCG PO TABS
175.0000 ug | ORAL_TABLET | Freq: Every day | ORAL | Status: DC
  Administered 2020-10-24 – 2020-10-28 (×5): 175 ug via ORAL
  Filled 2020-10-23 (×5): qty 1

## 2020-10-23 MED ORDER — TAMSULOSIN HCL 0.4 MG PO CAPS
0.4000 mg | ORAL_CAPSULE | Freq: Every day | ORAL | Status: DC
  Administered 2020-10-23 – 2020-10-27 (×5): 0.4 mg via ORAL
  Filled 2020-10-23 (×6): qty 1

## 2020-10-23 MED ORDER — NEPRO/CARBSTEADY PO LIQD
237.0000 mL | Freq: Two times a day (BID) | ORAL | Status: DC
  Administered 2020-10-24 – 2020-10-28 (×7): 237 mL via ORAL

## 2020-10-23 MED ORDER — INSULIN DETEMIR 100 UNIT/ML ~~LOC~~ SOLN
15.0000 [IU] | Freq: Every day | SUBCUTANEOUS | Status: DC
  Administered 2020-10-23 – 2020-10-25 (×3): 15 [IU] via SUBCUTANEOUS
  Filled 2020-10-23 (×4): qty 0.15

## 2020-10-23 MED ORDER — SODIUM CHLORIDE 0.9 % IV SOLN
2.0000 g | Freq: Once | INTRAVENOUS | Status: AC
  Administered 2020-10-23: 17:00:00 2 g via INTRAVENOUS
  Filled 2020-10-23: qty 2

## 2020-10-23 MED ORDER — ASPIRIN 81 MG PO CHEW
81.0000 mg | CHEWABLE_TABLET | Freq: Every day | ORAL | Status: DC
  Administered 2020-10-24 – 2020-10-26 (×3): 81 mg via ORAL
  Filled 2020-10-23 (×3): qty 1

## 2020-10-23 MED ORDER — ACETAMINOPHEN 325 MG PO TABS: 650.0000 mg | ORAL_TABLET | Freq: Four times a day (QID) | ORAL | Status: DC | PRN

## 2020-10-23 MED ORDER — HYDROCODONE-ACETAMINOPHEN 5-325 MG PO TABS: 1.0000 | ORAL_TABLET | ORAL | Status: DC | PRN

## 2020-10-23 MED ORDER — ACETAMINOPHEN 650 MG RE SUPP
650.0000 mg | Freq: Four times a day (QID) | RECTAL | Status: DC | PRN
  Administered 2020-10-28: 19:00:00 650 mg via RECTAL
  Filled 2020-10-23: qty 1

## 2020-10-23 NOTE — Progress Notes (Signed)
Pharmacy Antibiotic Note  Michael Trevino is a 79 y.o. male admitted on 10/19/2020 with sepsis.  Pharmacy has been consulted for Vancomycin and cefepime dosing. Pt with ESRD, HD catheter in place, unsure of schedule Plan: Vancomycin 1500mg  IV x 1 now, f/u HD schedule, 1000mg  after every  HD Cefepime 2gm IV x 1 now, f/u HD schedule, cefepime 2gm IV after every HD F/U cxs and clinical progress Monitor V/S, labs and vanc levels as indicated  Height: 5\' 9"  (175.3 cm) Weight: 94.3 kg (207 lb 14.3 oz) IBW/kg (Calculated) : 70.7  Temp (24hrs), Avg:99.2 F (37.3 C), Min:99.2 F (37.3 C), Max:99.2 F (37.3 C)  Recent Labs  Lab 10/28/2020 1503  WBC 27.8*  CREATININE 12.11*  LATICACIDVEN 1.2    Estimated Creatinine Clearance: 5.6 mL/min (A) (by C-G formula based on SCr of 12.11 mg/dL (H)).    Allergies  Allergen Reactions  . Zocor [Simvastatin - High Dose] Nausea Only    Unknown    Antimicrobials this admission: Vancomycin 12/11 >>  Cefepime 12/11>>   Dose adjustments this admission: prn  Microbiology results: 12/11BCx: pending 12/11 UCx: pending  MRSA PCR:   Thank you for allowing pharmacy to be a part of this patient's care.  Cristy Friedlander 10/27/2020 4:41 PM

## 2020-10-23 NOTE — ED Notes (Signed)
Pt in bed emitting snoring like noise, pt arouses to verbal stim, pt knows that he is at [facility], doesn't know the day of the week, re oriented pt, pt placed on cardiac monitor, per ems pt got dialysis yesterday because he missed Thursday, states that he is normally t th sat dialysis, pt had reported some R hip pain.

## 2020-10-23 NOTE — H&P (Addendum)
TRH H&P   Patient Demographics:    Michael Trevino, is a 79 y.o. male  MRN: [id number]   DOB - 06-04-1941  Admit Date - 11/06/2020  Outpatient Primary MD for the patient is Dettinger, Fransisca Kaufmann, MD  Referring MD/NP/PA: Berneice Gandy  Patient coming from: SNF  Chief Complaint  Patient presents with  . Hip Pain  . Fever      HPI:    Michael Trevino  is a 79 y.o. male, malewith past medical history relevant for HTN, DM2,nephrotic syndrome, ESRD, started hemodialysis last month, hypothyroidism, CAD with recent admission due to NSTEMI, and HLD and obesity . -Patient with recent hospitalization at [facility], where hemodialysis was initiated, where he was discharged to SNF, patient was brought by the facility due to fever, and hip pain, altered mental status, initially patient was altered per ED physician, but this has improved upon my evaluation, son was at bedside helps with the history, patient with fever 101.2 per EMS, for which she received Tylenol, son report patient transfused 2 units PRBC on Thursday, so he missed dialysis that day, so he was dialyzed on Friday, he was supposed to be dialyzed today as well, as well patient with complaints of right hip pain for last 2 to 3 days,. - in ED patient was noticed to be with urinary retention, 1200 cc of foul-smelling purulent urine drained after Foley catheter insertion, work-up significant for potassium of 5.2, creatinine of 12.1, white blood cell count of 27.8, but normal lactic acid, hemoglobin at 9.8, chest x-ray with no acute findings, UA with elevated leukocytes, but no pyuria, but hip x-ray with no evidence of fracture, [facility] consulted to admit.   Review of systems:    In addition to the HPI above, Ports of fever and chills, generalized weakness and decreased appetite No Headache, No changes with Vision or hearing, No  problems swallowing food or Liquids, No Chest pain, Cough or Shortness of Breath, She does report some abdominal pain, but no nausea or vomiting, bowel movements are regular Parental with renal retention in ED 1200 cc urine output upon insertion No dysuria, No new skin rashes or bruises, No new joints pains-aches,  No new weakness, tingling, numbness in any extremity, No recent weight gain or loss, No polyuria, polydypsia or polyphagia, No significant Mental Stressors.  A full 10 point Review of Systems was done, except as stated above, all other Review of Systems were negative.   With Past History of the following :    Past Medical History:  Diagnosis Date  . Arthritis   . Diabetes mellitus without complication ([place])   . Heart attack ([place])   . Hyperlipidemia   . Hypertension   . MI (myocardial infarction) ([place]) 2000  . Renal disorder   . Sepsis ([place])   . Sepsis due to Streptococcus, group B ([place]) 02/24/2013  . Stroke ([place])   .  Thyroid disease    hypothyroid      Past Surgical History:  Procedure Laterality Date  . AV FISTULA PLACEMENT Left 10/05/2020   Procedure: LEFT ARM FIRST STAGE BASILIC  ARTERIOVENOUS (AV) FISTULA CREATION;  Surgeon: Angelia Mould, MD;  Location: [facility];  Service: Vascular;  Laterality: Left;  . CARDIAC CATHETERIZATION  2000  . INSERTION OF DIALYSIS CATHETER Right 10/05/2020   Procedure: INSERTION OF RIGHT INTERNAL JUGULAR TUNNELED DIALYSIS CATHETER;  Surgeon: Angelia Mould, MD;  Location: [facility];  Service: Vascular;  Laterality: Right;  . IR FLUORO GUIDE CV LINE RIGHT  10/01/2020  . IR US GUIDE VASC ACCESS RIGHT  10/01/2020  . LEFT HEART CATH AND CORONARY ANGIOGRAPHY N/A 10/04/2020   Procedure: LEFT HEART CATH AND CORONARY ANGIOGRAPHY;  Surgeon: Lorretta Harp, MD;  Location: [facility] CV [facility];  Service: Cardiovascular;  Laterality: N/A;  . TEE WITHOUT CARDIOVERSION N/A 02/26/2013   Procedure: TRANSESOPHAGEAL ECHOCARDIOGRAM  (TEE);  Surgeon: Thayer Headings, MD;  Location: [facility] ENDOSCOPY;  Service: Cardiovascular;  Laterality: N/A;      Social History:     Social History   Tobacco Use  . Smoking status: Former Smoker    Packs/day: 0.50    Years: 30.00    Pack years: 15.00    Types: Cigarettes    Quit date: 11/05/1984    Years since quitting: 35.9  . Smokeless tobacco: Never Used  Substance Use Topics  . Alcohol use: No        Family History :     Family History  Problem Relation Age of Onset  . Diabetes Mother   . Hypertension Mother   . Cancer Father   . Stroke Brother   . Alcohol abuse Brother   . Diabetes Brother   . Diabetes Sister   . Diabetes Brother 13       TYPE 1  . Kidney disease Brother 76       DIALYSIS  . Heart attack Brother   . Healthy Daughter   . Healthy Son   . Healthy Daughter   . Healthy Son   . Healthy Son   . Healthy Son       Home Medications:   Prior to Admission medications   Medication Sig Start Date End Date Taking? Authorizing Provider  acetaminophen (TYLENOL) 325 MG tablet Take 2 tablets (650 mg total) by mouth every 4 (four) hours as needed for headache or mild pain. 10/13/20   Nolberto Hanlon, MD  amLODipine (NORVASC) 10 MG tablet TAKE 1 TABLET BY MOUTH EVERY DAY Patient taking differently: Take 10 mg by mouth daily.  06/14/20   Dettinger, Fransisca Kaufmann, MD  ascorbic acid (VITAMIN C) 500 MG tablet Take 500 mg by mouth daily.    [provider]  aspirin 81 MG chewable tablet Chew 1 tablet (81 mg total) by mouth daily. 10/14/20   Nolberto Hanlon, MD  atorvastatin (LIPITOR) 80 MG tablet Take 1 tablet (80 mg total) by mouth daily. 10/14/20   Nolberto Hanlon, MD  bisacodyl (DULCOLAX) 10 MG suppository Place 1 suppository (10 mg total) rectally daily as needed for moderate constipation. 10/13/20   Nolberto Hanlon, MD  calcitRIOL (ROCALTROL) 0.5 MCG capsule Take 0.5 mcg by mouth daily. 04/09/20   [provider]  Calcium Carbonate 500 MG CHEW Chew 1 tablet  (500 mg total) by mouth 3 (three) times daily as needed. Patient taking differently: Chew 500 mg by mouth daily.  05/01/16   Cherre Robins,  PharmD  cholecalciferol (VITAMIN D3) 25 MCG (1000 UNIT) tablet Take 1,000 Units by mouth daily.    [provider]  Darbepoetin Alfa (ARANESP) 40 MCG/0.4ML SOSY injection Inject 0.4 mLs (40 mcg total) into the vein every Thursday with hemodialysis. 10/14/20   Nolberto Hanlon, MD  glucose blood test strip Use to check blood glucose once daily.  Dx:  E11.9 07/14/15   Wardell Honour, MD  hydrALAZINE (APRESOLINE) 50 MG tablet Take 1 tablet (50 mg total) by mouth 3 (three) times daily. 10/13/20   Nolberto Hanlon, MD  insulin detemir (LEVEMIR FLEXPEN) 100 UNIT/ML FlexPen Inject 25 Units into the skin at bedtime. 06/23/20   Brita Romp, NP  levothyroxine (SYNTHROID) 175 MCG tablet TAKE 1 TABLET (175 MCG TOTAL) BY MOUTH DAILY BEFORE BREAKFAST. 10/13/20   Brita Romp, NP  metoprolol tartrate (LOPRESSOR) 100 MG tablet Take 1 tablet (100 mg total) by mouth 2 (two) times daily. 10/13/20   Nolberto Hanlon, MD  multivitamin (RENA-VIT) TABS tablet Take 1 tablet by mouth at bedtime. 10/13/20   Nolberto Hanlon, MD  nitroGLYCERIN (NITROSTAT) 0.4 MG SL tablet Place 1 tablet (0.4 mg total) under the tongue every 5 (five) minutes as needed for chest pain. 10/13/20   Nolberto Hanlon, MD  Nutritional Supplements (FEEDING SUPPLEMENT, NEPRO CARB STEADY,) LIQD Take 237 mLs by mouth 2 (two) times daily between meals. 10/13/20   Nolberto Hanlon, MD  oxyCODONE-acetaminophen (PERCOCET) 5-325 MG tablet Take 1 tablet by mouth every 4 (four) hours as needed for severe pain. 10/05/20 10/05/21  Karoline Caldwell, PA-C     Allergies:     Allergies  Allergen Reactions  . Zocor [Simvastatin - High Dose] Nausea Only    Unknown     Physical Exam:   Vitals  Blood pressure (!) 127/43, pulse 83, temperature 99.2 F (37.3 C), temperature source Oral, resp. rate 20, height 5\' 9"  (1.753 m), weight  94.3 kg, SpO2 95 %.   1. General seemingly frail, deconditioned male, laying in bed in mild discomfort  2.  Slow to respond, sluggish, with some diminished coherence and insight, but overall he is awake oriented x3, answering most of the questions appropriately .  3. No F.N deficits, ALL C.Nerves Intact, Strength 5/5 all 4 extremities(lower extremity movement limited due to pain in the hip area), Sensation intact all 4 extremities, Plantars down going.  4. Ears and Eyes appear Normal, Conjunctivae clear, PERRLA. Moist Oral Mucosa.  5. Supple Neck, No JVD, No cervical lymphadenopathy appriciated, No Carotid Bruits.  6. Symmetrical Chest wall movement, Good air movement bilaterally, CTAB.  7. RRR, No Gallops, Rubs or Murmurs, No Parasternal Heave.  8. Positive Bowel Sounds, Abdomen Soft, No tenderness, No organomegaly appriciated,No rebound -guarding or rigidity.  9.  No Cyanosis, Normal Skin Turgor, No Skin Rash or Bruise.  10  joints appear normal , no effusions, right hip range of motion limited due to pain, he has tenderness to palpation in the anterior hip area  11. No Palpable Lymph Nodes in Neck or Axillae    Data Review:    CBC Recent Labs  Lab 10/27/2020 1503  WBC 27.8*  HGB 9.8*  HCT 31.2*  PLT 268  MCV 92.3  MCH 29.0  MCHC 31.4  RDW 14.3  LYMPHSABS 1.7  MONOABS 1.9*  EOSABS 0.0  BASOSABS 0.1   ------------------------------------------------------------------------------------------------------------------  Chemistries  Recent Labs  Lab 11/06/2020 1503  NA 132*  K 5.2*  CL 92*  CO2 22  GLUCOSE 120*  BUN 79*  CREATININE 12.11*  CALCIUM 7.0*  AST 43*  ALT 29  ALKPHOS 75  BILITOT 0.7   ------------------------------------------------------------------------------------------------------------------ estimated creatinine clearance is 5.6 mL/min (A) (by C-G formula based on SCr of 12.11 mg/dL  (H)). ------------------------------------------------------------------------------------------------------------------ No results for input(s): TSH, T4TOTAL, T3FREE, THYROIDAB in the last 72 hours.  Invalid input(s): FREET3  Coagulation profile Recent Labs  Lab 11/10/2020 1503  INR 1.2   ------------------------------------------------------------------------------------------------------------------- No results for input(s): DDIMER in the last 72 hours. -------------------------------------------------------------------------------------------------------------------  Cardiac Enzymes No results for input(s): CKMB, TROPONINI, MYOGLOBIN in the last 168 hours.  Invalid input(s): CK ------------------------------------------------------------------------------------------------------------------    Component Value Date/Time   BNP 1,022.0 (H) 09/30/2020 0654     ---------------------------------------------------------------------------------------------------------------  Urinalysis    Component Value Date/Time   COLORURINE AMBER (A) 10/25/2020 1344   APPEARANCEUR CLOUDY (A) 11/09/2020 1344   LABSPEC 1.015 10/19/2020 1344   PHURINE 6.0 10/21/2020 1344   GLUCOSEU 50 (A) 11/12/2020 1344   HGBUR SMALL (A) 10/24/2020 1344   BILIRUBINUR NEGATIVE 11/12/2020 1344   KETONESUR NEGATIVE 10/25/2020 1344   PROTEINUR >=300 (A) 10/22/2020 1344   UROBILINOGEN 1.0 02/23/2013 1136   NITRITE NEGATIVE 10/25/2020 1344   LEUKOCYTESUR LARGE (A) 11/09/2020 1344    ----------------------------------------------------------------------------------------------------------------   Imaging Results:    DG Chest Port 1 View  Result Date: 10/27/2020 CLINICAL DATA:  79 year old male with questionable sepsis. EXAM: PORTABLE CHEST 1 VIEW COMPARISON:  10/05/2020 FINDINGS: Unchanged cardiomegaly. Similar appearance of indwelling right IJ tunneled hemodialysis catheter with the catheter tip in the  superior vena cava. Interval improved aeration of the lungs bilaterally. Probable trace left pleural effusion. No new focal consolidations or pneumothorax. No acute osseous abnormality. IMPRESSION: No airspace opacities that would be concerning for pneumonia. Interval improved fluid status with decreased pulmonary edema and decreased size of probable trace left pleural effusion. Unchanged cardiomegaly. Electronically Signed   By: Ruthann Cancer MD   On: 11/11/2020 14:31   DG Hip Unilat W or Wo Pelvis 2-3 Views Right  Result Date: 11/09/2020 CLINICAL DATA:  79 year old male with right hip pain status post fall yesterday. EXAM: DG HIP (WITH OR WITHOUT PELVIS) 2-3V RIGHT COMPARISON:  None. FINDINGS: There is no evidence of hip fracture or dislocation. 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 Degenerative changes of the lower lumbar spine soft tissues are unremarkable. IMPRESSION: 1. No acute fracture or malalignment. 2. Symmetric fraying of pelvic entheses as could be seen with renal osteodystrophy. 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  Fever/leukocytosis -This is in the setting of infectious process, cytosis of 20 7K, will start empirically on broad-spectrum antibiotic vancomycin and cefepime,  given he is a dialysis patient, with known permacath, blood cultures was sent. -With known urinary retention, 1200 cc output with purulent urine, even though no pyuria, but he has significant lites, will follow neuro cultures, and will treat as UTI, narrow antibiotics once more culture data available. -Have some abdominal pain, so will obtain CT abdomen and pelvis.  Right hip pain -That is post fall at the facility, x-ray with no acute finding, will proceed with CT right hip.  Urinary retention -1200 cc output of cloudy urine after Foley catheter insertion, will start on Flomax.  Chronic diastolic CHF - Echocardiogram from 09/30/2020 has demonstrated EF of 55-60% with severe LVH, and a small pericardial effusion without significant valvular abnormalities and no significant wall motion abnormalities. No sign of tamponade. Moderate AV regurgitation. Moderate aortic stenosis -Appears to be euvolemic, volume management with dialysis  CAD:  - LHC performed on 10/04/2020.  It demonstrated an occluded RCA with grade 3 left to right collaterals. There was also a 50% mid LAD lesion. LVEDP was only mildly elevated at 19. - .Recent non-STEMI admission -Continue with aspirin, statin and beta-blockers, he denies any chest pain.  ESRD -On hemodialysis TTS, missed dialysis on Thursday, he was dialyzed yesterday, renal consulted by ED.  Hypothyroidism:  -Continue Synthroid as at home.   Diabetes mellitus, type II, insulin-dependent -Resume Levemir at a lower dose 25> 15 units nightly, and start on insulin sliding scale.  Essential Hypertension:  -Pressure acceptable, hold amlodipine and hydralazine in case he will be dialyzed today, continue with metoprolol patient is normotensive today on  amlodipine, hydralazine, and metoprolol.  Review of chronic kidney disease -Son reports patient was transfused units PRBC last Thursday Proc[place] per renal.  DVT Prophylaxis Heparin   AM Labs Ordered, also please review Full Orders  Family Communication: Admission, patients condition and plan of care including tests being ordered have been discussed with the patient and son at bedside who indicate understanding and agree with the plan and Code Status.  Code Status fULL  Likely DC to  snf  Condition GUARDED    Consults called: renal by ED    Admission status: inpatient    Time spent in minutes : 65 minutes   Phillips Climes M.D on 11/09/2020 at 4:38 PM   [facility] - Office  [phone]

## 2020-10-23 NOTE — ED Provider Notes (Signed)
[facility] Provider Note   CSN: [id number] Arrival date & time: 11/12/2020  1258     History Chief Complaint  Patient presents with  . Hip Pain  . Fever    Michael Trevino is a 79 y.o. male.  The history is provided by the patient, the EMS personnel and medical records. No language interpreter was used.  Hip Pain  Fever    79 year old male significant history of diabetes, hypertension, prior stroke, prior MI, renal disorder brought here via EMS from [facility] facility with complaints of fever and hip pain.  History limited due to patient's altered mental status.  Per EMS note, patient has a temperature of 101.2 this morning and was given Tylenol.  On recheck, suture did improved to 99.2.  2 days ago patient was given 2 units of blood for low hemoglobin.  He also received dialysis yesterday since he missed his usual Tuesday Thursday Saturday dialysis session, last session missed was Thursday.  Per patient, he developed pain primarily to his right hip for the past 2 to 3 days.  States pain was very painful but currently he denies any active pain.  He does not report any fever chills chest pain cough abdominal pain.  When asked if he urinates he said yes.  Patient reports tested for Covid recently and it was negative.  3:11 PM Additional history obtained through son who is at bedside.  Patient has history of nephrotic syndrome, who was admitted to the hospital on 11/18 for creation of left arm AV fistula but subsequently developed chest discomfort and shortness of breath requiring hospital admission.  It shows the patient has bilateral pleural effusion consistent with CHF exacerbation, and underwent successful ultrasound-guided thoracentesis.  He then was transferred to a rehab facility but was sent here today due to having a fever of 101.2.   Past Medical History:  Diagnosis Date  . Arthritis   . Diabetes mellitus without complication ([place])   . Heart  attack ([place])   . Hyperlipidemia   . Hypertension   . MI (myocardial infarction) ([place]) 2000  . Renal disorder   . Sepsis ([place])   . Sepsis due to Streptococcus, group B ([place]) 02/24/2013  . Stroke ([place])   . Thyroid disease    hypothyroid    Patient Active Problem List   Diagnosis Date Noted  . Acute exacerbation of CHF (congestive heart failure) ([place]) 09/30/2020  . CKD stage 5 due to type 2 diabetes mellitus ([place]) 09/30/2020  . Pleural effusion--Lt > Rt 09/30/2020  . Acute on chronic respiratory failure with hypoxia ([place]) 09/30/2020  . Acute respiratory failure with hypoxia ([place]) 09/30/2020  . Cellulitis and abscess of lower extremity 11/30/2019  . CHF (congestive heart failure) ([place]) 09/30/2019  . Fluid overload 09/24/2019  . CKD stage 4 due to type 2 diabetes mellitus ([place]) 01/09/2018  . Essential hypertension, benign 09/11/2017  . Class 2 severe obesity due to excess calories with serious comorbidity and body mass index (BMI) of 37.0 to 37.9 in adult ([place]) 09/11/2017  . Nonrheumatic aortic valve insufficiency 05/03/2017  . Uncontrolled type 2 diabetes with stage 4 chronic kidney disease ([place]) 05/03/2017  . Non-ST elevation (NSTEMI) myocardial infarction ([place])   . Arthritis   . Erectile dysfunction 04/11/2013  . Mixed hyperlipidemia 04/11/2013  . Hypothyroidism 02/08/2011  . Illiterate 02/08/2011  . CAD (coronary artery disease) 02/08/2011    Past Surgical History:  Procedure Laterality Date  . AV FISTULA PLACEMENT Left 10/05/2020  Procedure: LEFT ARM FIRST STAGE BASILIC  ARTERIOVENOUS (AV) FISTULA CREATION;  Surgeon: Angelia Mould, MD;  Location: [facility];  Service: Vascular;  Laterality: Left;  . CARDIAC CATHETERIZATION  2000  . INSERTION OF DIALYSIS CATHETER Right 10/05/2020   Procedure: INSERTION OF RIGHT INTERNAL JUGULAR TUNNELED DIALYSIS CATHETER;  Surgeon: Angelia Mould, MD;  Location: [facility];  Service: Vascular;  Laterality: Right;  . IR FLUORO GUIDE CV  LINE RIGHT  10/01/2020  . IR US GUIDE VASC ACCESS RIGHT  10/01/2020  . LEFT HEART CATH AND CORONARY ANGIOGRAPHY N/A 10/04/2020   Procedure: LEFT HEART CATH AND CORONARY ANGIOGRAPHY;  Surgeon: Lorretta Harp, MD;  Location: [facility];  Service: Cardiovascular;  Laterality: N/A;  . TEE WITHOUT CARDIOVERSION N/A 02/26/2013   Procedure: TRANSESOPHAGEAL ECHOCARDIOGRAM (TEE);  Surgeon: Thayer Headings, MD;  Location[facility] ENDOS[facility];  Service: Cardiovascular;  Laterality: N/A;       Family History  Problem Relation Age of Onset  . Diabetes Mother   . Hypertension Mother   . Cancer Father   . Stroke Brother   . Alcohol abuse Brother   . Diabetes Brother   . Diabetes Sister   . Diabetes Brother 13       TYPE 1  . Kidney disease Brother 22       DIALYSIS  . Heart attack Brother   . Healthy Daughter   . Healthy Son   . Healthy Daughter   . Healthy Son   . Healthy Son   . Healthy Son     Social History   Tobacco Use  . Smoking status: Former Smoker    Packs/day: 0.50    Years: 30.00    Pack years: 15.00    Types: Cigarettes    Quit date: 11/05/1984    Years since quitting: 35.9  . Smokeless tobacco: Never Used  Vaping Use  . Vaping Use: Never used  Substance Use Topics  . Alcohol use: No  . Drug use: No    Home Medications Prior to Admission medications   Medication Sig Start Date End Date Taking? Authorizing Provider  acetaminophen (TYLENOL) 325 MG tablet Take 2 tablets (650 mg total) by mouth every 4 (four) hours as needed for headache or mild pain. 10/13/20   Nolberto Hanlon, MD  amLODipine (NORVASC) 10 MG tablet TAKE 1 TABLET BY MOUTH EVERY DAY Patient taking differently: Take 10 mg by mouth daily.  06/14/20   Dettinger, Fransisca Kaufmann, MD  ascorbic acid (VITAMIN C) 500 MG tablet Take 500 mg by mouth daily.    [provider]  aspirin 81 MG chewable tablet Chew 1 tablet (81 mg total) by mouth daily. 10/14/20   Nolberto Hanlon, MD  atorvastatin (LIPITOR) 80 MG  tablet Take 1 tablet (80 mg total) by mouth daily. 10/14/20   Nolberto Hanlon, MD  bisacodyl (DULCOLAX) 10 MG suppository Place 1 suppository (10 mg total) rectally daily as needed for moderate constipation. 10/13/20   Nolberto Hanlon, MD  calcitRIOL (ROCALTROL) 0.5 MCG capsule Take 0.5 mcg by mouth daily. 04/09/20   [provider]  Calcium Carbonate 500 MG CHEW Chew 1 tablet (500 mg total) by mouth 3 (three) times daily as needed. Patient taking differently: Chew 500 mg by mouth daily.  05/01/16   Cherre Robins, PharmD  cholecalciferol (VITAMIN D3) 25 MCG (1000 UNIT) tablet Take 1,000 Units by mouth daily.    [provider]  Darbepoetin Alfa (ARANESP) 40 MCG/0.4ML SOSY injection Inject 0.4 mLs (  40 mcg total) into the vein every Thursday with hemodialysis. 10/14/20   Nolberto Hanlon, MD  glucose blood test strip Use to check blood glucose once daily.  Dx:  E11.9 07/14/15   Wardell Honour, MD  hydrALAZINE (APRESOLINE) 50 MG tablet Take 1 tablet (50 mg total) by mouth 3 (three) times daily. 10/13/20   Nolberto Hanlon, MD  insulin detemir (LEVEMIR FLEXPEN) 100 UNIT/ML FlexPen Inject 25 Units into the skin at bedtime. 06/23/20   Brita Romp, NP  levothyroxine (SYNTHROID) 175 MCG tablet TAKE 1 TABLET (175 MCG TOTAL) BY MOUTH DAILY BEFORE BREAKFAST. 10/13/20   Brita Romp, NP  metoprolol tartrate (LOPRESSOR) 100 MG tablet Take 1 tablet (100 mg total) by mouth 2 (two) times daily. 10/13/20   Nolberto Hanlon, MD  multivitamin (RENA-VIT) TABS tablet Take 1 tablet by mouth at bedtime. 10/13/20   Nolberto Hanlon, MD  nitroGLYCERIN (NITROSTAT) 0.4 MG SL tablet Place 1 tablet (0.4 mg total) under the tongue every 5 (five) minutes as needed for chest pain. 10/13/20   Nolberto Hanlon, MD  Nutritional Supplements (FEEDING SUPPLEMENT, NEPRO CARB STEADY,) LIQD Take 237 mLs by mouth 2 (two) times daily between meals. 10/13/20   Nolberto Hanlon, MD  oxyCODONE-acetaminophen (PERCOCET) 5-325 MG tablet Take 1 tablet by  mouth every 4 (four) hours as needed for severe pain. 10/05/20 10/05/21  Baglia, Corrina, PA-C    Allergies    Zocor [simvastatin - high dose]  Review of Systems   Review of Systems  Unable to perform ROS: Mental status change  Constitutional: Positive for fever.    Physical Exam Updated Vital Signs BP (!) 115/47 (BP Location: Right Arm)   Pulse 81   Temp 99.2 F (37.3 C) (Oral)   Resp (!) 25   Ht 5\' 9"  (1.753 m)   Wt 94.3 kg   SpO2 96%   BMI 30.70 kg/m   Physical Exam Vitals and nursing note reviewed.  Constitutional:      General: He is not in acute distress.    Appearance: He is well-developed and well-nourished.     Comments: Elderly male, appears drowsy, snoring but arousable and appears to be in no acute discomfort.  HENT:     Head: Atraumatic.     Mouth/Throat:     Mouth: Mucous membranes are moist.  Eyes:     Extraocular Movements: Extraocular movements intact.     Conjunctiva/sclera: Conjunctivae normal.     Pupils: Pupils are equal, round, and reactive to light.  Cardiovascular:     Rate and Rhythm: Normal rate and regular rhythm.     Heart sounds: Murmur heard.    Pulmonary:     Effort: Pulmonary effort is normal.     Breath sounds: Normal breath sounds.     Comments: Permacath noted to right upper chest wall without any signs of infection. Abdominal:     Palpations: Abdomen is soft.     Tenderness: There is no abdominal tenderness.     Comments: No obvious abdominal tenderness on exam.  Musculoskeletal:        General: Tenderness (Right hip: Tenderness with hip flexion but minimal reproducible pain on palpation.  No overlying skin changes.) present.     Cervical back: Neck supple.  Skin:    Findings: No rash.  Neurological:     Mental Status: He is disoriented.     GCS: GCS eye subscore is 3. GCS verbal subscore is 4. GCS motor subscore is 6.  Psychiatric:  Mood and Affect: Mood and affect normal.     ED Results / Procedures /  Treatments   Labs (all labs ordered are listed, but only abnormal results are displayed) Labs Reviewed  COMPREHENSIVE METABOLIC PANEL - Abnormal; Notable for the following components:      Result Value   Sodium 132 (*)    Potassium 5.2 (*)    Chloride 92 (*)    Glucose, Bld 120 (*)    BUN 79 (*)    Creatinine, Ser 12.11 (*)    Calcium 7.0 (*)    Albumin 2.7 (*)    AST 43 (*)    GFR, Estimated 4 (*)    Anion gap 18 (*)    All other components within normal limits  CBC WITH DIFFERENTIAL/PLATELET - Abnormal; Notable for the following components:   WBC 27.8 (*)    RBC 3.38 (*)    Hemoglobin 9.8 (*)    HCT 31.2 (*)    Neutro Abs 23.6 (*)    Monocytes Absolute 1.9 (*)    Abs Immature Granulocytes 0.62 (*)    All other components within normal limits  URINALYSIS, ROUTINE W REFLEX MICROSCOPIC - Abnormal; Notable for the following components:   Color, Urine AMBER (*)    APPearance CLOUDY (*)    Glucose, UA 50 (*)    Hgb urine dipstick SMALL (*)    Protein, ur >=300 (*)    Leukocytes,Ua LARGE (*)    Bacteria, UA RARE (*)    All other components within normal limits  CULTURE, BLOOD (SINGLE)  URINE CULTURE  RESP PANEL BY RT-PCR (FLU A&B, COVID) ARPGX2  LACTIC ACID, PLASMA  PROTIME-INR  APTT  LACTIC ACID, PLASMA    EKG EKG Interpretation  Date/Time:  Saturday October 23 2020 13:13:01 EST Ventricular Rate:  83 PR Interval:    QRS Duration: 118 QT Interval:  434 QTC Calculation: 510 R Axis:   -49 Text Interpretation: Sinus rhythm Probable left atrial enlargement Left anterior fascicular block Left ventricular hypertrophy Borderline T abnormalities, lateral leads possible t wave peaking Confirmed by Aletta Edouard [phone]) on 10/20/2020 1:27:34 PM   Radiology DG Chest Port 1 View  Result Date: 10/26/2020 CLINICAL DATA:  79 year old male with questionable sepsis. EXAM: PORTABLE CHEST 1 VIEW COMPARISON:  10/05/2020 FINDINGS: Unchanged cardiomegaly. Similar appearance of  indwelling right IJ tunneled hemodialysis catheter with the catheter tip in the superior vena cava. Interval improved aeration of the lungs bilaterally. Probable trace left pleural effusion. No new focal consolidations or pneumothorax. No acute osseous abnormality. IMPRESSION: No airspace opacities that would be concerning for pneumonia. Interval improved fluid status with decreased pulmonary edema and decreased size of probable trace left pleural effusion. Unchanged cardiomegaly. Electronically Signed   By: Ruthann Cancer MD   On: 10/28/2020 14:31   DG Hip Unilat W or Wo Pelvis 2-3 Views Right  Result Date: 10/17/2020 CLINICAL DATA:  79 year old male with right hip pain status post fall yesterday. EXAM: DG HIP (WITH OR WITHOUT PELVIS) 2-3V RIGHT COMPARISON:  None. FINDINGS: There is no evidence of hip fracture or dislocation. 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 Degenerative changes of the lower lumbar spine soft tissues are unremarkable. IMPRESSION: 1. No acute fracture or malalignment. 2. Symmetric fraying of pelvic entheses as could be seen with renal osteodystrophy. Electronically Signed   By: Ruthann Cancer MD   On: 11/04/2020 14:59  Procedures .Critical Care Performed by: Domenic Moras, PA-C Authorized by: Domenic Moras, PA-C   Critical care provider statement:    Critical care time (minutes):  37   Critical care was time spent personally by me on the following activities:  Discussions with consultants, evaluation of patient's response to treatment, examination of patient, ordering and performing treatments and interventions, ordering and review of laboratory studies, ordering and review of radiographic studies, pulse oximetry, re-evaluation of patient's condition, obtaining history from patient or surrogate and review of  old charts   (including critical care time)  Medications Ordered in ED Medications  vancomycin (VANCOREADY) IVPB 1500 mg/300 mL (has no administration in time range)  ceFEPIme (MAXIPIME) 2 g in sodium chloride 0.9 % 100 mL IVPB (2 g Intravenous New Bag/Given 10/21/2020 1643)  sodium zirconium cyclosilicate (LOKELMA) packet 10 g (has no administration in time range)  tamsulosin (FLOMAX) capsule 0.4 mg (has no administration in time range)    ED Course  I have reviewed the triage vital signs and the nursing notes.  Pertinent labs & imaging results that were available during my care of the patient were reviewed by me and considered in my medical decision making (see chart for details).  Clinical Course as of 10/17/2020 1555  Sat Oct 23, 9309  [id number] 79 year old male fairly poor historian on dialysis through temporary access and had a recent fistula creation.  Apparently there was a fall last week and now here with right hip pain.  Also noted to have a fever at the facility.  No cough no diarrhea.  Getting labs and cultures lactate imaging.  Likely will need admission. [MB]    Clinical Course User Index [MB] Hayden Rasmussen, MD   MDM Rules/Calculators/A&P                          BP (!) 124/48 (BP Location: Right Arm)   Pulse 97   Temp 98.7 F (37.1 C) (Oral)   Resp 18   Ht 5\' 9"  (1.753 m)   Wt 94.3 kg   SpO2 97%   BMI 30.70 kg/m   Final Clinical Impression(s) / ED Diagnoses Final diagnoses:  Acute encephalopathy  Sepsis, due to unspecified organism, unspecified whether acute organ dysfunction present ([place])  Acute lower UTI  Hyperkalemia    Rx / DC Orders ED Discharge Orders    None     1:50 PM Patient brought here for fever and right hip pain.  Does have a significant comorbidities including diabetes, CKD requiring dialysis, and hypertension.  He does have pain to his right hip on hip flexion without any obvious signs of cellulitis noted.  He has several ecchymosis on his  abdominal wall from insulin injection.  Abdomen otherwise nontender.  In the setting of fever, hip pain, and potential sepsis, code sepsis initiated.  Work-up initiated.  Will avoid aggressive fluid resuscitation as patient is not hypotensive and he also has history of end-stage renal disease.  Nurse perform bladder scan and noted retained urine greater than 1000 cc.  Since patient does have lower abdominal pain, will place a Foley and obtain UA.  Urine is dark and cloudy concerning for UTI, will await UA.  I suspect this could be the source of his fever and lower abdominal pain  3:57 PM Labs remarkable for potassium 5.2 and elevated abnormal kidney function consistent with CKD.  White count is 27.8, and UA shows large leukocyte esterase and cloudy in appearance.  Will consult medicine for admission.  Will give Lokelma for hyperkalemia.  X-ray of the right hip and pelvis without any acute finding.  Portable chest x-rays show no evidence of pneumonia.  Rocephin given for UTI.  4:15 PM Appreciate consultation from triad hospitalist Dr. Waldron Labs who agrees to see admit patient.  Since patient has a permacath and receiving dialysis, possible signs of infection could be coming from the [place].  Therefore will broaden antibiotic to include vancomycin and cefepime per pharmacy.  There is a potential that patient may have missed his last dialysis as his potassium is elevated, will consult nephrology as well.  Patient will need to be admitted for further care.  4:49 PM Appreciate consultation from Nephrologist DR. Singh who recommend starting Lokelma 10g PO and he will have pt on his list for dialysis.     Domenic Moras, PA-C 10/25/2020 1651    Hayden Rasmussen, MD 10/15/[id number]

## 2020-10-23 NOTE — ED Notes (Signed)
Pt in bed, bladder scan >999, PA notified, pt moans with pain when lower abd is palpated, pt had mucous like substance on depends, number 16 fr foley cath placed on first attempt, gray/yellow urine out, pt had small bm, cleaned pt, family at bedside, aprox 1200 ml of foul urine out.  Pt remains on cardiac and O2 sat monitor.

## 2020-10-23 NOTE — ED Notes (Signed)
Pt in bed with eyes closed, pt arouses easily to verbal stim, pt knows person and place, doesn't know day of the week.  Re oriented pt.  Pt denies pain.

## 2020-10-23 NOTE — ED Triage Notes (Signed)
Pt. Brought in by EMS from [facility] due to fever and rt hip pain. Pt initially had temp 101.2 this am was given tylenol. On recheck in pts room temp 99.2. Pt was given 2 units of blood on Thursday for hgb. Pt received dialysis yesterday since he missed Thursday dialysis. Pt has a permacath fro dialysis . Tested for covid with negative reults

## 2020-10-24 ENCOUNTER — Other Ambulatory Visit: Payer: Self-pay

## 2020-10-24 DIAGNOSIS — I2510 Atherosclerotic heart disease of native coronary artery without angina pectoris: Secondary | ICD-10-CM

## 2020-10-24 LAB — MRSA PCR SCREENING: MRSA by PCR: NEGATIVE

## 2020-10-24 LAB — BASIC METABOLIC PANEL
Anion gap: 18 — ABNORMAL HIGH (ref 5–15)
BUN: 88 mg/dL — ABNORMAL HIGH (ref 8–23)
CO2: 19 mmol/L — ABNORMAL LOW (ref 22–32)
Calcium: 6.5 mg/dL — ABNORMAL LOW (ref 8.9–10.3)
Chloride: 94 mmol/L — ABNORMAL LOW (ref 98–111)
Creatinine, Ser: 12.81 mg/dL — ABNORMAL HIGH (ref 0.61–1.24)
GFR, Estimated: 4 mL/min — ABNORMAL LOW (ref >60)
Glucose, Bld: 111 mg/dL — ABNORMAL HIGH (ref 70–99)
Potassium: 4.3 mmol/L (ref 3.5–5.1)
Sodium: 131 mmol/L — ABNORMAL LOW (ref 135–145)

## 2020-10-24 LAB — RENAL FUNCTION PANEL
Albumin: 2 g/dL — ABNORMAL LOW (ref 3.5–5.0)
Anion gap: 18 — ABNORMAL HIGH (ref 5–15)
BUN: 86 mg/dL — ABNORMAL HIGH (ref 8–23)
CO2: 19 mmol/L — ABNORMAL LOW (ref 22–32)
Calcium: 6.5 mg/dL — ABNORMAL LOW (ref 8.9–10.3)
Chloride: 94 mmol/L — ABNORMAL LOW (ref 98–111)
Creatinine, Ser: 12.82 mg/dL — ABNORMAL HIGH (ref 0.61–1.24)
GFR, Estimated: 4 mL/min — ABNORMAL LOW (ref >60)
Glucose, Bld: 110 mg/dL — ABNORMAL HIGH (ref 70–99)
Phosphorus: 8.3 mg/dL — ABNORMAL HIGH (ref 2.5–4.6)
Potassium: 4.4 mmol/L (ref 3.5–5.1)
Sodium: 131 mmol/L — ABNORMAL LOW (ref 135–145)

## 2020-10-24 LAB — CBC
HCT: 25.2 % — ABNORMAL LOW (ref 39.0–52.0)
Hemoglobin: 8 g/dL — ABNORMAL LOW (ref 13.0–17.0)
MCH: 28.9 pg (ref 26.0–34.0)
MCHC: 31.7 g/dL (ref 30.0–36.0)
MCV: 91 fL (ref 80.0–100.0)
Platelets: 217 10*3/uL (ref 150–400)
RBC: 2.77 MIL/uL — ABNORMAL LOW (ref 4.22–5.81)
RDW: 14.1 % (ref 11.5–15.5)
WBC: 21.7 10*3/uL — ABNORMAL HIGH (ref 4.0–10.5)
nRBC: 0 % (ref 0.0–0.2)

## 2020-10-24 LAB — GLUCOSE, CAPILLARY
Glucose-Capillary: 97 mg/dL (ref 70–99)
Glucose-Capillary: 134 mg/dL — ABNORMAL HIGH (ref 70–99)
Glucose-Capillary: 120 mg/dL — ABNORMAL HIGH (ref 70–99)
Glucose-Capillary: 200 mg/dL — ABNORMAL HIGH (ref 70–99)

## 2020-10-24 MED ORDER — HYDROCODONE-ACETAMINOPHEN 5-325 MG PO TABS
1.0000 | ORAL_TABLET | Freq: Four times a day (QID) | ORAL | Status: DC | PRN
  Administered 2020-10-24 – 2020-10-25 (×4): 1 via ORAL
  Filled 2020-10-24 (×2): qty 1
  Filled 2020-10-24: qty 2
  Filled 2020-10-24 (×2): qty 1

## 2020-10-24 MED ORDER — SODIUM CHLORIDE 0.9 % IV SOLN
2.0000 g | INTRAVENOUS | Status: DC
  Administered 2020-10-25: 23:00:00 2 g via INTRAVENOUS
  Filled 2020-10-24: qty 2

## 2020-10-24 MED ORDER — CHLORHEXIDINE GLUCONATE CLOTH 2 % EX PADS
6.0000 | MEDICATED_PAD | Freq: Every day | CUTANEOUS | Status: DC
  Administered 2020-10-24 – 2020-10-29 (×5): 6 via TOPICAL

## 2020-10-24 NOTE — Progress Notes (Signed)
PROGRESS NOTE   Michael Trevino  UKG:[id number] DOB: December 04, 1940 DOA: 10/31/2020 PCP: Dettinger, Fransisca Kaufmann, MD   Chief Complaint  Patient presents with  . Hip Pain  . Fever    Brief Admission History:   79 y.o. male, malewith past medical history relevant for HTN, DM2,nephrotic syndrome, ESRD, started hemodialysis last month, hypothyroidism, CAD with recent admission due to NSTEMI, and HLD and obesity . -Patient with recent hospitalization at [facility], where hemodialysis was initiated, where he was discharged to SNF, patient was brought by the facility due to fever, and hip pain, altered mental status, initially patient was altered per ED physician, but this has improved upon my evaluation, son was at bedside helps with the history, patient with fever 101.2 per EMS, for which she received Tylenol, son report patient transfused 2 units PRBC on Thursday, so he missed dialysis that day, so he was dialyzed on Friday, he was supposed to be dialyzed today as well, as well patient with complaints of right hip pain for last 2 to 3 days,. - in ED patient was noticed to be with urinary retention, 1200 cc of foul-smelling purulent urine drained after Foley catheter insertion, work-up significant for potassium of 5.2, creatinine of 12.1, white blood cell count of 27.8, but normal lactic acid, hemoglobin at 9.8, chest x-ray with no acute findings, UA with elevated leukocytes, but no pyuria, but hip x-ray with no evidence of fracture, [facility] hospitalist consulted to admit.  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 Sepsis ruled out 2. UTI - secondary to urinary retention - pyuria found during cath - continue cefepime, follow urine culture.   3. Acute urinary retention - foley cath in place.  Will need urology follow up.  4. ESRD on HD - TTS, consult to nephrology for hemodialysis treatments.  5. Essential hypertension - resumed home medications.  6. Hip pain - no fracture seen on imaging.  PT evaluation requested.  7. Uncontrolled type 2 diabetes mellitus with CKD  DVT prophylaxis:  SCDs Code Status: full  Family Communication:  Disposition:   Status is: Inpatient  Remains inpatient appropriate because:Inpatient level of care appropriate due to severity of illness   Dispo: The patient is from: ALF              Anticipated d/c is to: ALF              Anticipated d/c date is: 2 days              Patient currently is not medically stable to d/c.  Consultants:   nephrology  Procedures:     Antimicrobials:  Cefepime 12/11>> Vancomycin 12/11  Subjective: Pt says that he would like some water to drink and to eat breakfast, he is aware he is in the hospital  Objective: Vitals:   10/24/2020 2039 10/24/20 0202 10/24/20 0600 10/24/20 0742  BP: (!) 123/44 (!) 119/50 (!) 120/50 (!) 118/42  Pulse: 95 96 95 100  Resp: 18 18 18 18   Temp: 98.2 F (36.8 C) 98.8 F (37.1 C) 98.2 F (36.8 C) 99 F (37.2 C)  TempSrc: Oral Oral Oral Oral  SpO2: 96% 95% 96% 97%  Weight:  95 kg     Height: 5\' 9"  (1.753 m)       Intake/Output Summary (Last 24 hours) at 10/24/2020 1112 Last data filed at 10/24/2020 0600 Gross per 24 hour  Intake 576.83 ml  Output --  Net 576.83 ml   Filed Weights   11/04/2020 1319 11/12/2020 2039  Weight: 94.3 kg 95 kg   Examination:  General exam: chronically ill appearing male, Appears calm and comfortable  Respiratory system: Clear to auscultation. Respiratory effort normal. Cardiovascular system: normal S1 & S2 heard. No JVD, murmurs, rubs, gallops or clicks. No pedal edema. Gastrointestinal system: Abdomen is nondistended, soft and nontender. No organomegaly or masses felt. Normal bowel  sounds heard. Central nervous system: Alert and oriented. No focal neurological deficits. Extremities: Symmetric 5 x 5 power. Skin: No rashes, lesions or ulcers Psychiatry: Judgement and insight appear normal. Mood & affect appropriate.   Data Reviewed: I have personally reviewed following labs and imaging studies  CBC: Recent Labs  Lab 11/11/2020 1503 10/24/20 0507  WBC 27.8* 21.7*  NEUTROABS 23.6*  --   HGB 9.8* 8.0*  HCT 31.2* 25.2*  MCV 92.3 91.0  PLT 268 161    Basic Metabolic Panel: Recent Labs  Lab 10/21/2020 1503 10/24/20 0507  NA 132* 131*  131*  K 5.2* 4.3  4.4  CL 92* 94*  94*  CO2 22 19*  19*  GLUCOSE 120* 111*  110*  BUN 79* 88*  86*  CREATININE 12.11* 12.81*  12.82*  CALCIUM 7.0* 6.5*  6.5*  PHOS  --  8.3*    GFR: Estimated Creatinine Clearance: 5.3 mL/min (A) (by C-G formula based on SCr of 12.82 mg/dL (H)).  Liver Function Tests: Recent Labs  Lab 11/07/2020 1503 10/24/20 0507  AST 43*  --   ALT 29  --   ALKPHOS 75  --   BILITOT 0.7  --   PROT 7.8  --   ALBUMIN 2.7* 2.0*    CBG: Recent Labs  Lab 11/04/2020 2120 10/24/20 0738  GLUCAP 102* 97    Recent Results (from the past 240 hour(s))  Blood culture (routine single)     Status: None (Preliminary result)   Collection Time: 10/16/2020  3:03 PM   Specimen: Right Antecubital; Blood  Result Value Ref Range Status   Specimen Description   Final    RIGHT ANTECUBITAL BOTTLES DRAWN AEROBIC AND ANAEROBIC   Special Requests Blood Culture adequate volume  Final   Culture   Final    NO GROWTH < 24 HOURS Performed at [facility], [address], [place],  [address]    Report Status PENDING  Incomplete  Resp Panel by RT-PCR (Flu A&B, Covid) Nasopharyngeal Swab     Status: None   Collection Time: 10/26/2020  3:56 PM   Specimen: Nasopharyngeal Swab; Nasopharyngeal(NP) swabs in vial transport medium  Result Value Ref Range Status   SARS Coronavirus 2 by RT PCR NEGATIVE NEGATIVE Final     Comment: (NOTE) SARS-CoV-2 target nucleic acids are NOT DETECTED.  The SARS-CoV-2 RNA is generally detectable in upper respiratory specimens during the acute phase of infection. The lowest concentration of SARS-CoV-2 viral copies this assay can detect is 138 copies/mL. A negative result does not preclude SARS-Cov-2 infection and should not be used as the sole basis for treatment or other patient management decisions. A negative result may occur with  improper specimen collection/handling, submission of specimen other than nasopharyngeal swab, presence of viral mutation(s) within the areas targeted by  this assay, and inadequate number of viral copies(<138 copies/mL). A negative result must be combined with clinical observations, patient history, and epidemiological information. The expected result is Negative.  Fact Sheet for Patients:  EntrepreneurPulse.com.au  Fact Sheet for Healthcare Providers:  IncredibleEmployment.be  This test is no t yet approved or cleared by the [place] FDA and  has been authorized for detection and/or diagnosis of SARS-CoV-2 by FDA under an Emergency Use Authorization (EUA). This EUA will remain  in effect (meaning this test can be used) for the duration of the COVID-19 declaration under Section 564(b)(1) of the Act, 21 U.S.C.section 360bbb-3(b)(1), unless the authorization is terminated  or revoked sooner.       Influenza A by PCR NEGATIVE NEGATIVE Final   Influenza B by PCR NEGATIVE NEGATIVE Final    Comment: (NOTE) The Xpert Xpress SARS-CoV-2/FLU/RSV plus assay is intended as an aid in the diagnosis of influenza from Nasopharyngeal swab specimens and should not be used as a sole basis for treatment. Nasal washings and aspirates are unacceptable for Xpert Xpress SARS-CoV-2/FLU/RSV testing.  Fact Sheet for Patients: EntrepreneurPulse.com.au  Fact Sheet for Healthcare  Providers: IncredibleEmployment.be  This test is not yet approved or cleared by the [place] FDA and has been authorized for detection and/or diagnosis of SARS-CoV-2 by FDA under an Emergency Use Authorization (EUA). This EUA will remain in effect (meaning this test can be used) for the duration of the COVID-19 declaration under Section 564(b)(1) of the Act, 21 U.S.C. section 360bbb-3(b)(1), unless the authorization is terminated or revoked.  Performed at [facility], [address], [place], [place] [address]   MRSA PCR Screening     Status: None   Collection Time: 10/18/2020 10:38 PM   Specimen: Nasal Mucosa; Nasopharyngeal  Result Value Ref Range Status   MRSA by PCR NEGATIVE NEGATIVE Final    Comment:        The GeneXpert MRSA Assay (FDA approved for NASAL specimens only), is one component of a comprehensive MRSA colonization surveillance program. It is not intended to diagnose MRSA infection nor to guide or monitor treatment for MRSA infections. Performed at [facility], [address] [place], [place] [address]      Radiology Studies: CT ABDOMEN PELVIS WO CONTRAST  Result Date: 10/18/2020 CLINICAL DATA:  Sepsis, unknown source, UTI and urinary retention. Fever and right hip pain. EXAM: CT ABDOMEN AND PELVIS WITHOUT CONTRAST TECHNIQUE: Multidetector CT imaging of the abdomen and pelvis was performed following the standard protocol without IV contrast. COMPARISON:  None. FINDINGS: Markedly limited evaluation due to respiratory motion artifact. Lower chest: At least small to moderate volume left pleural effusion. Visualized portions of the left lower lobes are collapsed. The left ventricle is prominent in size. Hepatobiliary: There is a hypodense 4.7 cm lesion within the left hepatic lobe. The remainder of the a patent parenchyma is grossly unremarkable on this noncontrast study. No CT evidence of calcified gallstones. The gallbladder is grossly  unremarkable on this noncontrast study. No biliary ductal dilatation. Pancreas: No focal lesion. Normal pancreatic contour. No surrounding inflammatory changes. No main pancreatic ductal dilatation. Spleen: Normal in size without focal abnormality. Adrenals/Urinary Tract: No adrenal nodule bilaterally. Bilateral renal cortical scarring. At least mild bilateral, right greater than left, hydroureteronephrosis. No nephrolithiasis and no contour-deforming renal mass. No ureterolithiasis. The urinary bladder is decompressed with a Foley catheter terminating within its lumen. Urinary bladder walls appear thickened. Perivesicular fat stranding. Stomach/Bowel: Stomach is within normal limits. No evidence of bowel wall  thickening or dilatation. Diffuse descending colon and sigmoid diverticulosis. The appendix is normal in caliber. Vascular/Lymphatic: No abdominal aorta or iliac aneurysm. Moderate to severe atherosclerotic plaque of the aorta and its branches. No abdominal, pelvic, or inguinal lymphadenopathy. Reproductive: The prostate is enlarged in size measuring up to 5.2 cm. Other: Diffuse mild haziness of the mesentery likely due to motion. No intraperitoneal free fluid. No intraperitoneal free gas. No organized fluid collection. Musculoskeletal: Mild subcutaneus soft tissue edema. Slightly asymmetrical and hypodensity of the left flank abdominal wall musculature with overlying 6.9 x 2.3 cm slightly higher than simple fluid density suggestive of possible hematoma formation. Soft tissue density within the anterior subcutaneus soft tissues likely related to medication injections. Possible tiny right fat  inguinal hernia. No suspicious lytic or blastic osseous lesions. No cortical erosion or destruction. No acute displaced fracture. Multilevel degenerative changes of the spine. At least mild bilateral hip degenerative changes. IMPRESSION: 1. Limited evaluation due to respiratory motion artifact and noncontrast study. 2.  Collecting system and urinary bladder findings consistent with known infection and urinary retention in the setting of prostatomegaly. 3. Slightly asymmetric and hypodense left flank abdominal wall musculature with overlying 6.9 x 2.3 cm slightly higher than simple fluid density suggestive of possible hematoma formation. No organized fluid collection, though limited evaluation on this noncontrast study. Superimposed infection is not excluded. 4. Mild to moderate size left pleural effusion that is incompletely visualized. 5. Indeterminate 4.7 cm left hepatic lobe lesion. 6. Other imaging findings of potential clinical significance: Prominent, likely enlarged, left ventricle. Possible tiny right fat inguinal hernia. Aortic Atherosclerosis (ICD10-I70.0). Electronically Signed   By: Iven Finn M.D.   On: 10/22/2020 17:40   DG Chest Port 1 View  Result Date: 10/15/2020 CLINICAL DATA:  79 year old male with questionable sepsis. EXAM: PORTABLE CHEST 1 VIEW COMPARISON:  10/05/2020 FINDINGS: Unchanged cardiomegaly. Similar appearance of indwelling right IJ tunneled hemodialysis catheter with the catheter tip in the superior vena cava. Interval improved aeration of the lungs bilaterally. Probable trace left pleural effusion. No new focal consolidations or pneumothorax. No acute osseous abnormality. IMPRESSION: No airspace opacities that would be concerning for pneumonia. Interval improved fluid status with decreased pulmonary edema and decreased size of probable trace left pleural effusion. Unchanged cardiomegaly. Electronically Signed   By: Ruthann Cancer MD   On: 10/24/2020 14:31   DG Hip Unilat W or Wo Pelvis 2-3 Views Right  Result Date: 11/11/2020 CLINICAL DATA:  79 year old male with right hip pain status post fall yesterday. EXAM: DG HIP (WITH OR WITHOUT PELVIS) 2-3V RIGHT COMPARISON:  None. FINDINGS: There is no evidence of hip fracture or dislocation. 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 Degenerative changes of the lower lumbar spine soft tissues are unremarkable. IMPRESSION: 1. No acute fracture or malalignment. 2. Symmetric fraying of pelvic entheses as could be seen with renal osteodystrophy. Electronically Signed   By: Ruthann Cancer MD   On: 10/14/2020 14:59   Scheduled Meds: . aspirin  81 mg Oral Daily  . atorvastatin  80 mg Oral Daily  . calcitRIOL  0.5 mcg Oral Daily  . Chlorhexidine Gluconate Cloth  6 each Topical Daily  . feeding supplement (NEPRO CARB STEADY)  237 mL Oral BID BM  . insulin aspart  0-9 Units Subcutaneous TID WC  . insulin detemir  15 Units Subcutaneous QHS  . levothyroxine  175 mcg Oral  [id number]  . metoprolol tartrate  100 mg Oral BID  . tamsulosin  0.4 mg Oral QPC supper   Continuous Infusions: . methocarbamol (ROBAXIN) IV       LOS: 1 day   Time spent: 36 mins  Adonia Porada Wynetta Emery, MD How to contact the [facility] Attending or Consulting provider [place] or covering provider during after hours [place], for this patient?  1. Check the care team in [facility] and look for a) attending/consulting TRH provider listed and b) the [facility] team listed 2. Log into [email or web address] and use [place] universal password to access. If you do not have the password, please contact the hospital operator. 3. Locate the [facility] provider you are looking for under [facility] and page to a number that you can be directly reached. 4. If you still have difficulty reaching the provider, please page the [facility] (Director on Call) for the Hospitalists listed on amion for assistance.  10/24/2020, 11:12 AM

## 2020-10-25 ENCOUNTER — Ambulatory Visit: Payer: Medicare Other | Admitting: Nurse Practitioner

## 2020-10-25 LAB — GLUCOSE, CAPILLARY
Glucose-Capillary: 110 mg/dL — ABNORMAL HIGH (ref 70–99)
Glucose-Capillary: 121 mg/dL — ABNORMAL HIGH (ref 70–99)
Glucose-Capillary: 138 mg/dL — ABNORMAL HIGH (ref 70–99)
Glucose-Capillary: 65 mg/dL — ABNORMAL LOW (ref 70–99)
Glucose-Capillary: 82 mg/dL (ref 70–99)
Glucose-Capillary: 94 mg/dL (ref 70–99)

## 2020-10-25 LAB — RENAL FUNCTION PANEL
Albumin: 2 g/dL — ABNORMAL LOW (ref 3.5–5.0)
Anion gap: 19 — ABNORMAL HIGH (ref 5–15)
BUN: 105 mg/dL — ABNORMAL HIGH (ref 8–23)
CO2: 20 mmol/L — ABNORMAL LOW (ref 22–32)
Calcium: 6.5 mg/dL — ABNORMAL LOW (ref 8.9–10.3)
Chloride: 94 mmol/L — ABNORMAL LOW (ref 98–111)
Creatinine, Ser: 13.54 mg/dL — ABNORMAL HIGH (ref 0.61–1.24)
GFR, Estimated: 3 mL/min — ABNORMAL LOW (ref >60)
Glucose, Bld: 74 mg/dL (ref 70–99)
Phosphorus: 9.1 mg/dL — ABNORMAL HIGH (ref 2.5–4.6)
Potassium: 4.3 mmol/L (ref 3.5–5.1)
Sodium: 133 mmol/L — ABNORMAL LOW (ref 135–145)

## 2020-10-25 LAB — CBC WITH DIFFERENTIAL/PLATELET
Abs Immature Granulocytes: 0.15 10*3/uL — ABNORMAL HIGH (ref 0.00–0.07)
Basophils Absolute: 0 10*3/uL (ref 0.0–0.1)
Basophils Relative: 0 %
Eosinophils Absolute: 0.1 10*3/uL (ref 0.0–0.5)
Eosinophils Relative: 0 %
HCT: 24.2 % — ABNORMAL LOW (ref 39.0–52.0)
Hemoglobin: 7.6 g/dL — ABNORMAL LOW (ref 13.0–17.0)
Immature Granulocytes: 1 %
Lymphocytes Relative: 8 %
Lymphs Abs: 1.3 10*3/uL (ref 0.7–4.0)
MCH: 28.9 pg (ref 26.0–34.0)
MCHC: 31.4 g/dL (ref 30.0–36.0)
MCV: 92 fL (ref 80.0–100.0)
Monocytes Absolute: 1 10*3/uL (ref 0.1–1.0)
Monocytes Relative: 6 %
Neutro Abs: 13.6 10*3/uL — ABNORMAL HIGH (ref 1.7–7.7)
Neutrophils Relative %: 85 %
Platelets: 213 10*3/uL (ref 150–400)
RBC: 2.63 MIL/uL — ABNORMAL LOW (ref 4.22–5.81)
RDW: 14.1 % (ref 11.5–15.5)
WBC: 16 10*3/uL — ABNORMAL HIGH (ref 4.0–10.5)
nRBC: 0 % (ref 0.0–0.2)

## 2020-10-25 MED ORDER — HEPARIN SODIUM (PORCINE) 1000 UNIT/ML DIALYSIS: 1000.0000 [IU] | INTRAMUSCULAR | Status: DC | PRN

## 2020-10-25 MED ORDER — SODIUM CHLORIDE 0.9 % IV SOLN: 100.0000 mL | INTRAVENOUS | Status: DC | PRN

## 2020-10-25 MED ORDER — HEPARIN SODIUM (PORCINE) 1000 UNIT/ML DIALYSIS
3800.0000 [IU] | INTRAMUSCULAR | Status: DC | PRN
  Administered 2020-10-25: 22:00:00 3800 [IU] via INTRAVENOUS_CENTRAL
  Filled 2020-10-25: qty 4

## 2020-10-25 MED ORDER — ALTEPLASE 2 MG IJ SOLR
2.0000 mg | Freq: Once | INTRAMUSCULAR | Status: DC | PRN
  Filled 2020-10-25: qty 2

## 2020-10-25 NOTE — TOC Progression Note (Signed)
Transition of Care [facility]) - Progression Note    Patient Details  Name: Michael Trevino MRN: [id number] Date of Birth: May 27, 1941  [facility] CM/SW Contact  Boneta Lucks, RN Phone Number: 10/25/2020, 12:07 PM  Clinical Narrative:   Patient returning to [facility] when medically ready. Patient getting dialysis today and will need a couple more days of IV antibiotic. INS Auth started with [facility] and faxed clinicals. Mardene Celeste updated.      Barriers to Discharge: Continued Medical Work up    Readmission Risk Interventions Readmission Risk Prevention Plan 10/25/2020  Transportation Screening Complete  Palliative Care Screening Not Applicable  Medication Review (RN Care Manager) Complete  Some recent data might be hidden

## 2020-10-25 NOTE — Progress Notes (Signed)
Patient's sugar 65, treated po and increased to 84, MD aware

## 2020-10-25 NOTE — Progress Notes (Signed)
PROGRESS NOTE   Michael Trevino  ZYS:[id number] DOB: 1940/12/13 DOA: 11/04/2020 PCP: Dettinger, Fransisca Kaufmann, MD   Chief Complaint  Patient presents with  . Hip Pain  . Fever   Brief Admission History:   79 y.o. male, malewith past medical history relevant for HTN, DM2,nephrotic syndrome, ESRD, started hemodialysis last month, hypothyroidism, CAD with recent admission due to NSTEMI, and HLD and obesity . -Patient with recent hospitalization at [facility], where hemodialysis was initiated, where he was discharged to SNF, patient was brought by the facility due to fever, and hip pain, altered mental status, initially patient was altered per ED physician, but this has improved upon my evaluation, son was at bedside helps with the history, patient with fever 101.2 per EMS, for which she received Tylenol, son report patient transfused 2 units PRBC on Thursday, so he missed dialysis that day, so he was dialyzed on Friday, he was supposed to be dialyzed today as well, as well patient with complaints of right hip pain for last 2 to 3 days. - in ED patient was noticed to be with urinary retention, 1200 cc of foul-smelling purulent urine drained after Foley catheter insertion, work-up significant for potassium of 5.2, creatinine of 12.1, white blood cell count of 27.8, but normal lactic acid, hemoglobin at 9.8, chest x-ray with no acute findings, UA with elevated leukocytes, but no pyuria, but hip x-ray with no evidence of fracture, [facility] hospital[facility] consulted to admit.  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 Sepsis ruled out 2. UTI - secondary to urinary retention - pyuria found during cath placement - continue cefepime, follow urine  culture.  3. Acute urinary retention - foley cath in place.  Will need urology follow up after discharge.  4. ESRD on HD - TTS, consult to nephrology for hemodialysis treatments.  5. Essential hypertension - resumed home medications.  6. Hip pain - no fracture seen on imaging.  PT evaluation requested. Recommending SNF.  7. Uncontrolled type 2 diabetes mellitus with CKD - continue to monitor closely.    DVT prophylaxis:  SCDs Code Status: full  Family Communication: updated wife/daughter 12/13  Disposition:   Status is: Inpatient  Remains inpatient appropriate because:Inpatient level of care appropriate due to severity of illness   Dispo: The patient is from: ALF              Anticipated d/c is to: SNF              Anticipated d/c date is: 1 day              Patient currently is not medically stable to d/c.  He remains on IV antibiotics pending C&S results.   Consultants:   nephrology  Procedures:     Antimicrobials:  Cefepime 12/11>> Vancomycin 12/11  Subjective: Pt denies complaints.  Permission granted to call and update family.   Objective: Vitals:   10/24/20 1200 10/24/20 1500 10/24/20 2107 10/25/20 0443  BP: (!) 106/41 (!) 109/59 (!) 107/45 (!) 122/46  Pulse: 86 91 94 80  Resp:  20 20 14   Temp: 98.8 F (37.1 C) 98.5 F (36.9 C) 98.5 F (36.9 C) 98.3 F (36.8 C)  TempSrc:  Oral  Oral  SpO2: 95% 96% 92% 93%  Weight:      Height:        Intake/Output Summary (Last 24 hours) at 10/25/2020 1351 Last data filed at 10/24/2020 1700 Gross per 24 hour  Intake --  Output 450 ml  Net -450 ml   Filed Weights   11/10/2020 1319 11/01/2020 2039  Weight: 94.3 kg 95 kg   Examination:  General exam: chronically ill appearing male, Appears calm and comfortable  Respiratory system: Clear to auscultation. Respiratory effort normal. Cardiovascular system: normal S1 & S2 heard. No JVD, murmurs, rubs, gallops or clicks. No pedal edema. Gastrointestinal system: Abdomen  is nondistended, soft and nontender. No organomegaly or masses felt. Normal bowel sounds heard. Central nervous system: Alert and oriented. No focal neurological deficits. Extremities: Symmetric 5 x 5 power. Skin: No rashes, lesions or ulcers Psychiatry: Judgement and insight appear normal. Mood & affect appropriate.   Data Reviewed: I have personally reviewed following labs and imaging studies  CBC: Recent Labs  Lab 11/01/2020 1503 10/24/20 0507 10/25/20 0529  WBC 27.8* 21.7* 16.0*  NEUTROABS 23.6*  --  13.6*  HGB 9.8* 8.0* 7.6*  HCT 31.2* 25.2* 24.2*  MCV 92.3 91.0 92.0  PLT 268 217 151    Basic Metabolic Panel: Recent Labs  Lab 11/09/2020 1503 10/24/20 0507 10/25/20 0529  NA 132* 131*  131* 133*  K 5.2* 4.3  4.4 4.3  CL 92* 94*  94* 94*  CO2 22 19*  19* 20*  GLUCOSE 120* 111*  110* 74  BUN 79* 88*  86* 105*  CREATININE 12.11* 12.81*  12.82* 13.54*  CALCIUM 7.0* 6.5*  6.5* 6.5*  PHOS  --  8.3* 9.1*    GFR: Estimated Creatinine Clearance: 5 mL/min (A) (by C-G formula based on SCr of 13.54 mg/dL (H)).  Liver Function Tests: Recent Labs  Lab 11/05/2020 1503 10/24/20 0507 10/25/20 0529  AST 43*  --   --   ALT 29  --   --   ALKPHOS 75  --   --   BILITOT 0.7  --   --   PROT 7.8  --   --   ALBUMIN 2.7* 2.0* 2.0*    CBG: Recent Labs  Lab 10/24/20 2109 10/25/20 0254 10/25/20 0734 10/25/20 0848 10/25/20 1104  GLUCAP 200* 82 65* 94 138*    Recent Results (from the past 240 hour(s))  Urine culture     Status: Abnormal (Preliminary result)   Collection Time: 11/07/2020  1:44 PM   Specimen: Urine, Catheterized  Result Value Ref Range Status   Specimen Description   Final    URINE, CATHETERIZED Performed at [facility], [address] [place], [place] [address]    Special Requests   Final    NONE Performed at [facility], [address], [place], [place] [address]    Culture (A)  Final    >=100,000 COLONIES/mL PSEUDOMONAS  AERUGINOSA SUSCEPTIBILITIES TO FOLLOW Performed at [facility], [place] [address], [place],  [address]    Report Status PENDING  Incomplete  Blood culture (routine single)     Status: None (Preliminary result)   Collection Time: 11/09/2020  3:03 PM   Specimen: Right Antecubital; Blood  Result Value Ref Range Status   Specimen Description   Final    RIGHT ANTECUBITAL BOTTLES DRAWN AEROBIC AND ANAEROBIC   Special Requests Blood Culture adequate volume  Final   Culture   Final  NO GROWTH 2 DAYS Performed at [facility], [address], [place], [place] [address]    Report Status PENDING  Incomplete  Resp Panel by RT-PCR (Flu A&B, Covid) Nasopharyngeal Swab     Status: None   Collection Time: 10/19/2020  3:56 PM   Specimen: Nasopharyngeal Swab; Nasopharyngeal(NP) swabs in vial transport medium  Result Value Ref Range Status   SARS Coronavirus 2 by RT PCR NEGATIVE NEGATIVE Final    Comment: (NOTE) SARS-CoV-2 target nucleic acids are NOT DETECTED.  The SARS-CoV-2 RNA is generally detectable in upper respiratory specimens during the acute phase of infection. The lowest concentration of SARS-CoV-2 viral copies this assay can detect is 138 copies/mL. A negative result does not preclude SARS-Cov-2 infection and should not be used as the sole basis for treatment or other patient management decisions. A negative result may occur with  improper specimen collection/handling, submission of specimen other than nasopharyngeal swab, presence of viral mutation(s) within the areas targeted by this assay, and inadequate number of viral copies(<138 copies/mL). A negative result must be combined with clinical observations, patient history, and epidemiological information. The expected result is Negative.  Fact Sheet for Patients:  EntrepreneurPulse.com.au  Fact Sheet for Healthcare Providers:  IncredibleEmployment.be  This test is no t yet approved or  cleared by the [place] FDA and  has been authorized for detection and/or diagnosis of SARS-CoV-2 by FDA under an Emergency Use Authorization (EUA). This EUA will remain  in effect (meaning this test can be used) for the duration of the COVID-19 declaration under Section 564(b)(1) of the Act, 21 U.S.C.section 360bbb-3(b)(1), unless the authorization is terminated  or revoked sooner.       Influenza A by PCR NEGATIVE NEGATIVE Final   Influenza B by PCR NEGATIVE NEGATIVE Final    Comment: (NOTE) The Xpert Xpress SARS-CoV-2/FLU/RSV plus assay is intended as an aid in the diagnosis of influenza from Nasopharyngeal swab specimens and should not be used as a sole basis for treatment. Nasal washings and aspirates are unacceptable for Xpert Xpress SARS-CoV-2/FLU/RSV testing.  Fact Sheet for Patients: EntrepreneurPulse.com.au  Fact Sheet for Healthcare Providers: IncredibleEmployment.be  This test is not yet approved or cleared by the [place] FDA and has been authorized for detection and/or diagnosis of SARS-CoV-2 by FDA under an Emergency Use Authorization (EUA). This EUA will remain in effect (meaning this test can be used) for the duration of the COVID-19 declaration under Section 564(b)(1) of the Act, 21 U.S.C. section 360bbb-3(b)(1), unless the authorization is terminated or revoked.  Performed at [facility], [address] [place], [place] [address]   MRSA PCR Screening     Status: None   Collection Time: 11/07/2020 10:38 PM   Specimen: Nasal Mucosa; Nasopharyngeal  Result Value Ref Range Status   MRSA by PCR NEGATIVE NEGATIVE Final    Comment:        The GeneXpert MRSA Assay (FDA approved for NASAL specimens only), is one component of a comprehensive MRSA colonization surveillance program. It is not intended to diagnose MRSA infection nor to guide or monitor treatment for MRSA infections. Performed at [facility],  [address], [place], [place] [address]      Radiology Studies: CT ABDOMEN PELVIS WO CONTRAST  Result Date: 11/01/2020 CLINICAL DATA:  Sepsis, unknown source, UTI and urinary retention. Fever and right hip pain. EXAM: CT ABDOMEN AND PELVIS WITHOUT CONTRAST TECHNIQUE: Multidetector CT imaging of the abdomen and pelvis was performed following the standard protocol without IV contrast.  COMPARISON:  None. FINDINGS: Markedly limited evaluation due to respiratory motion artifact. Lower chest: At least small to moderate volume left pleural effusion. Visualized portions of the left lower lobes are collapsed. The left ventricle is prominent in size. Hepatobiliary: There is a hypodense 4.7 cm lesion within the left hepatic lobe. The remainder of the a patent parenchyma is grossly unremarkable on this noncontrast study. No CT evidence of calcified gallstones. The gallbladder is grossly unremarkable on this noncontrast study. No biliary ductal dilatation. Pancreas: No focal lesion. Normal pancreatic contour. No surrounding inflammatory changes. No main pancreatic ductal dilatation. Spleen: Normal in size without focal abnormality. Adrenals/Urinary Tract: No adrenal nodule bilaterally. Bilateral renal cortical scarring. At least mild bilateral, right greater than left, hydroureteronephrosis. No nephrolithiasis and no contour-deforming renal mass. No ureterolithiasis. The urinary bladder is decompressed with a Foley catheter terminating within its lumen. Urinary bladder walls appear thickened. Perivesicular fat stranding. Stomach/Bowel: Stomach is within normal limits. No evidence of bowel wall thickening or dilatation. Diffuse descending colon and sigmoid diverticulosis. The appendix is normal in caliber. Vascular/Lymphatic: No abdominal aorta or iliac aneurysm. Moderate to severe atherosclerotic plaque of the aorta and its branches. No abdominal, pelvic, or inguinal lymphadenopathy. Reproductive: The prostate is enlarged in  size measuring up to 5.2 cm. Other: Diffuse mild haziness of the mesentery likely due to motion. No intraperitoneal free fluid. No intraperitoneal free gas. No organized fluid collection. Musculoskeletal: Mild subcutaneus soft tissue edema. Slightly asymmetrical and hypodensity of the left flank abdominal wall musculature with overlying 6.9 x 2.3 cm slightly higher than simple fluid density suggestive of possible hematoma formation. Soft tissue density within the anterior subcutaneus soft tissues likely related to medication injections. Possible tiny right fat  inguinal hernia. No suspicious lytic or blastic osseous lesions. No cortical erosion or destruction. No acute displaced fracture. Multilevel degenerative changes of the spine. At least mild bilateral hip degenerative changes. IMPRESSION: 1. Limited evaluation due to respiratory motion artifact and noncontrast study. 2. Collecting system and urinary bladder findings consistent with known infection and urinary retention in the setting of prostatomegaly. 3. Slightly asymmetric and hypodense left flank abdominal wall musculature with overlying 6.9 x 2.3 cm slightly higher than simple fluid density suggestive of possible hematoma formation. No organized fluid collection, though limited evaluation on this noncontrast study. Superimposed infection is not excluded. 4. Mild to moderate size left pleural effusion that is incompletely visualized. 5. Indeterminate 4.7 cm left hepatic lobe lesion. 6. Other imaging findings of potential clinical significance: Prominent, likely enlarged, left ventricle. Possible tiny right fat inguinal hernia. Aortic Atherosclerosis (ICD10-I70.0). Electronically Signed   By: Iven Finn M.D.   On: 11/07/2020 17:40   DG Chest Port 1 View  Result Date: 11/05/2020 CLINICAL DATA:  79 year old male with questionable sepsis. EXAM: PORTABLE CHEST 1 VIEW COMPARISON:  10/05/2020 FINDINGS: Unchanged cardiomegaly. Similar appearance of  indwelling right IJ tunneled hemodialysis catheter with the catheter tip in the superior vena cava. Interval improved aeration of the lungs bilaterally. Probable trace left pleural effusion. No new focal consolidations or pneumothorax. No acute osseous abnormality. IMPRESSION: No airspace opacities that would be concerning for pneumonia. Interval improved fluid status with decreased pulmonary edema and decreased size of probable trace left pleural effusion. Unchanged cardiomegaly. Electronically Signed   By: Ruthann Cancer MD   On: 11/10/2020 14:31   DG Hip Unilat W or Wo Pelvis 2-3 Views Right  Result Date: 10/24/2020 CLINICAL DATA:  79 year old male with right hip pain status post fall yesterday.  EXAM: DG HIP (WITH OR WITHOUT PELVIS) 2-3V RIGHT COMPARISON:  None. FINDINGS: There is no evidence of hip fracture or dislocation. 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 Degenerative changes of the lower lumbar spine soft tissues are unremarkable. IMPRESSION: 1. No acute fracture or malalignment. 2. Symmetric fraying of pelvic entheses as could be seen with renal osteodystrophy. Electronically Signed   By: Ruthann Cancer MD   On: 11/01/2020 14:59   Scheduled Meds: . aspirin  81 mg Oral Daily  . atorvastatin  80 mg Oral Daily  . calcitRIOL  0.5 mcg Oral Daily  . Chlorhexidine Gluconate Cloth  6 each Topical Daily  . feeding supplement (NEPRO CARB STEADY)  237 mL Oral BID BM  . insulin aspart  0-9 Units Subcutaneous TID WC  . insulin detemir  15 Units Subcutaneous QHS  . levothyroxine  175 mcg Oral Q0600  . metoprolol tartrate  100 mg Oral BID  . tamsulosin  0.4 mg Oral QPC supper   Continuous Infusions: . ceFEPime (MAXIPIME) IV    . methocarbamol (ROBAXIN) IV       LOS: 2 days   Time spent: 37 mins  Delmar Dondero Wynetta Emery,  MD How to contact the [facility] Attending or Consulting provider [place] or covering provider during after hours [place], for this patient?  1. Check the care team in [facility] and look for a) attending/consulting [facility] provider listed and b) the [facility] team listed 2. Log into [email or web address] and use [place]'s universal password to access. If you do not have the password, please contact the hospital operator. 3. Locate the [facility] provider you are looking for under [facility] and page to a number that you can be directly reached. 4. If you still have difficulty reaching the provider, please page the [facility] (Director on Call) for the Hospitalists listed on amion for assistance.  10/25/2020, 1:51 PM

## 2020-10-25 NOTE — Consult Note (Signed)
[facility] Renal Consultation Note    Indication for Consultation:  Management of ESRD/hemodialysis; anemia, hypertension/volume and secondary hyperparathyroidism  HPI: Michael Trevino is a 79 y.o. male with a PMH significant for HTN, DM, CAD s/p NSTEMI (last hospitalization at end of November), HLD, obesity, and new ESRD who presented to [facility] ED via EMS from SNF with complaints of hip pain and fevers.  He missed HD on Thursday (received blood transfusion of 2 units) and went Friday but s/o early.  He presented to ED on 10/22/2020 and in the ED he was found to have urinary retention of 1200 cc of foudl-smelling, purulent urine s/p Foley catheter.  Labs were notable for K of 5.2, WBC 27.8, Hgb 9.8, and covid negative.  He was admitted for urinary retention with UTI and we were consulted to provide HD during his hospitalization.   Past Medical History:  Diagnosis Date  . Arthritis   . Diabetes mellitus without complication ([place])   . Heart attack ([place])   . Hyperlipidemia   . Hypertension   . MI (myocardial infarction) ([place]) 2000  . Renal disorder   . Sepsis ([place])   . Sepsis due to Streptococcus, group B ([place]) 02/24/2013  . Stroke ([place])   . Thyroid disease    hypothyroid   Past Surgical History:  Procedure Laterality Date  . AV FISTULA PLACEMENT Left 10/05/2020   Procedure: LEFT ARM FIRST STAGE BASILIC  ARTERIOVENOUS (AV) FISTULA CREATION;  Surgeon: Angelia Mould, MD;  Location: [facility];  Service: Vascular;  Laterality: Left;  . CARDIAC CATHETERIZATION  2000  . INSERTION OF DIALYSIS CATHETER Right 10/05/2020   Procedure: INSERTION OF RIGHT INTERNAL JUGULAR TUNNELED DIALYSIS CATHETER;  Surgeon: Angelia Mould, MD;  Location: [facility];  Service: Vascular;  Laterality: Right;  . IR FLUORO GUIDE CV LINE RIGHT  10/01/2020  . IR US GUIDE VASC ACCESS RIGHT  10/01/2020  . LEFT HEART CATH AND CORONARY ANGIOGRAPHY N/A 10/04/2020   Procedure: LEFT HEART CATH AND CORONARY  ANGIOGRAPHY;  Surgeon: Lorretta Harp, MD;  Location: [facility] CV LAB;  Service: Cardiovascular;  Laterality: N/A;  . TEE WITHOUT CARDIOVERSION N/A 02/26/2013   Procedure: TRANSESOPHAGEAL ECHOCARDIOGRAM (TEE);  Surgeon: Thayer Headings, MD;  Location: [facility] ENDOSCOPY;  Service: Cardiovascular;  Laterality: N/A;   Family History:   Family History  Problem Relation Age of Onset  . Diabetes Mother   . Hypertension Mother   . Cancer Father   . Stroke Brother   . Alcohol abuse Brother   . Diabetes Brother   . Diabetes Sister   . Diabetes Brother 13       TYPE 1  . Kidney disease Brother 67       DIALYSIS  . Heart attack Brother   . Healthy Daughter   . Healthy Son   . Healthy Daughter   . Healthy Son   . Healthy Son   . Healthy Son    Social History:  reports that he quit smoking about 35 years ago. His smoking use included cigarettes. He has a 15.00 pack-year smoking history. He has never used smokeless tobacco. He reports that he does not drink alcohol and does not use drugs. Allergies  Allergen Reactions  . Zocor [Simvastatin - High Dose] Nausea Only    Unknown   Prior to Admission medications   Medication Sig Start Date End Date Taking? Authorizing Provider  acetaminophen (TYLENOL) 325 MG tablet Take 2 tablets (650 mg total) by mouth every  4 (four) hours as needed for headache or mild pain. 10/13/20  Yes Nolberto Hanlon, MD  amLODipine (NORVASC) 10 MG tablet TAKE 1 TABLET BY MOUTH EVERY DAY Patient taking differently: Take 10 mg by mouth daily. 06/14/20  Yes Dettinger, Fransisca Kaufmann, MD  ascorbic acid (VITAMIN C) 500 MG tablet Take 500 mg by mouth daily.   Yes [provider]  aspirin 81 MG chewable tablet Chew 1 tablet (81 mg total) by mouth daily. 10/14/20  Yes Nolberto Hanlon, MD  atorvastatin (LIPITOR) 80 MG tablet Take 1 tablet (80 mg total) by mouth daily. 10/14/20  Yes Nolberto Hanlon, MD  calcitRIOL (ROCALTROL) 0.5 MCG capsule Take 0.5 mcg by mouth daily. 04/09/20  Yes  [provider]  Calcium Carbonate 500 MG CHEW Chew 1 tablet (500 mg total) by mouth 3 (three) times daily as needed. Patient taking differently: Chew 500 mg by mouth 3 (three) times daily as needed (indigestion). 05/01/16  Yes Cherre Robins, PharmD  cholecalciferol (VITAMIN D3) 25 MCG (1000 UNIT) tablet Take 1,000 Units by mouth daily.   Yes [provider]  hydrALAZINE (APRESOLINE) 50 MG tablet Take 1 tablet (50 mg total) by mouth 3 (three) times daily. 10/13/20  Yes Nolberto Hanlon, MD  insulin detemir (LEVEMIR FLEXPEN) 100 UNIT/ML FlexPen Inject 25 Units into the skin at bedtime. 06/23/20  Yes Reardon, Juanetta Beets, NP  levothyroxine (SYNTHROID) 175 MCG tablet TAKE 1 TABLET (175 MCG TOTAL) BY MOUTH DAILY BEFORE BREAKFAST. 10/13/20  Yes Brita Romp, NP  metoprolol tartrate (LOPRESSOR) 100 MG tablet Take 1 tablet (100 mg total) by mouth 2 (two) times daily. 10/13/20  Yes Nolberto Hanlon, MD  multivitamin (RENA-VIT) TABS tablet Take 1 tablet by mouth at bedtime. 10/13/20  Yes Nolberto Hanlon, MD  oxyCODONE-acetaminophen (PERCOCET) 5-325 MG tablet Take 1 tablet by mouth every 4 (four) hours as needed for severe pain. 10/05/20 10/05/21 Yes Baglia, Corrina, PA-C  bisacodyl (DULCOLAX) 10 MG suppository Place 1 suppository (10 mg total) rectally daily as needed for moderate constipation. Patient not taking: No sig reported 10/13/20   Nolberto Hanlon, MD  Darbepoetin Alfa (ARANESP) 40 MCG/0.4ML SOSY injection Inject 0.4 mLs (40 mcg total) into the vein every Thursday with hemodialysis. 10/14/20   Nolberto Hanlon, MD  glucose blood test strip Use to check blood glucose once daily.  Dx:  E11.9 07/14/15   Wardell Honour, MD  nitroGLYCERIN (NITROSTAT) 0.4 MG SL tablet Place 1 tablet (0.4 mg total) under the tongue every 5 (five) minutes as needed for chest pain. 10/13/20   Nolberto Hanlon, MD  Nutritional Supplements (FEEDING SUPPLEMENT, NEPRO CARB STEADY,) LIQD Take 237 mLs by mouth 2 (two) times daily between  meals. 10/13/20   Nolberto Hanlon, MD   Current Facility-Administered Medications  Medication Dose Route Frequency Provider Last Rate Last Admin  . acetaminophen (TYLENOL) tablet 650 mg  650 mg Oral Q6H PRN Elgergawy, Silver Huguenin, MD       Or  . acetaminophen (TYLENOL) suppository 650 mg  650 mg Rectal Q6H PRN Elgergawy, Silver Huguenin, MD      . aspirin chewable tablet 81 mg  81 mg Oral Daily Elgergawy, Silver Huguenin, MD   81 mg at 10/25/20 7893  . atorvastatin (LIPITOR) tablet 80 mg  80 mg Oral Daily Elgergawy, Silver Huguenin, MD   80 mg at 10/25/20 8101  . bisacodyl (DULCOLAX) suppository 10 mg  10 mg Rectal Daily PRN Elgergawy, Silver Huguenin, MD      . calcitRIOL (ROCALTROL) capsule 0.5  mcg  0.5 mcg Oral Daily Elgergawy, Silver Huguenin, MD   0.5 mcg at 10/25/20 0160  . ceFEPIme (MAXIPIME) 2 g in sodium chloride 0.9 % 100 mL IVPB  2 g Intravenous Q M,W,F-HD Johnson, Clanford L, MD      . Chlorhexidine Gluconate Cloth 2 % PADS 6 each  6 each Topical Daily Elgergawy, Silver Huguenin, MD   6 each at 10/25/20 [phone]  . feeding supplement (NEPRO CARB STEADY) liquid 237 mL  237 mL Oral BID BM Elgergawy, Silver Huguenin, MD   237 mL at 10/25/20 0813  . HYDROcodone-acetaminophen (NORCO/VICODIN) 5-325 MG per tablet 1-2 tablet  1-2 tablet Oral Q6H PRN Murlean Iba, MD   1 tablet at 10/25/20 0446  . insulin aspart (novoLOG) injection 0-9 Units  0-9 Units Subcutaneous TID WC Elgergawy, Silver Huguenin, MD   1 Units at 10/24/20 1200  . insulin detemir (LEVEMIR) injection 15 Units  15 Units Subcutaneous QHS Elgergawy, Silver Huguenin, MD   15 Units at 10/24/20 2200  . levothyroxine (SYNTHROID) tablet 175 mcg  175 mcg Oral Q0600 Elgergawy, Silver Huguenin, MD   175 mcg at 10/25/20 0447  . methocarbamol (ROBAXIN) 500 mg in dextrose 5 % 50 mL IVPB  500 mg Intravenous Q6H PRN Elgergawy, Silver Huguenin, MD      . metoprolol tartrate (LOPRESSOR) tablet 100 mg  100 mg Oral BID Elgergawy, Silver Huguenin, MD   100 mg at 10/24/20 2159  . tamsulosin (FLOMAX) capsule 0.4 mg  0.4 mg Oral QPC  supper Elgergawy, Silver Huguenin, MD   0.4 mg at 10/24/20 1713   Labs: Basic Metabolic Panel: Recent Labs  Lab 10/22/2020 1503 10/24/20 0507 10/25/20 0529  NA 132* 131*  131* 133*  K 5.2* 4.3  4.4 4.3  CL 92* 94*  94* 94*  CO2 22 19*  19* 20*  GLUCOSE 120* 111*  110* 74  BUN 79* 88*  86* 105*  CREATININE 12.11* 12.81*  12.82* 13.54*  CALCIUM 7.0* 6.5*  6.5* 6.5*  PHOS  --  8.3* 9.1*   Liver Function Tests: Recent Labs  Lab 10/24/2020 1503 10/24/20 0507 10/25/20 0529  AST 43*  --   --   ALT 29  --   --   ALKPHOS 75  --   --   BILITOT 0.7  --   --   PROT 7.8  --   --   ALBUMIN 2.7* 2.0* 2.0*   No results for input(s): LIPASE, AMYLASE in the last 168 hours. No results for input(s): AMMONIA in the last 168 hours. CBC: Recent Labs  Lab 10/14/2020 1503 10/24/20 0507 10/25/20 0529  WBC 27.8* 21.7* 16.0*  NEUTROABS 23.6*  --  13.6*  HGB 9.8* 8.0* 7.6*  HCT 31.2* 25.2* 24.2*  MCV 92.3 91.0 92.0  PLT 268 217 213   Cardiac Enzymes: No results for input(s): CKTOTAL, CKMB, CKMBINDEX, TROPONINI in the last 168 hours. CBG: Recent Labs  Lab 10/24/20 1602 10/24/20 2109 10/25/20 0254 10/25/20 0734 10/25/20 0848  GLUCAP 120* 200* 82 65* 94   Iron Studies: No results for input(s): IRON, TIBC, TRANSFERRIN, FERRITIN in the last 72 hours. Studies/Results: CT ABDOMEN PELVIS WO CONTRAST  Result Date: 11/07/2020 CLINICAL DATA:  Sepsis, unknown source, UTI and urinary retention. Fever and right hip pain. EXAM: CT ABDOMEN AND PELVIS WITHOUT CONTRAST TECHNIQUE: Multidetector CT imaging of the abdomen and pelvis was performed following the standard protocol without IV contrast. COMPARISON:  None. FINDINGS: Markedly limited evaluation due to respiratory motion  artifact. Lower chest: At least small to moderate volume left pleural effusion. Visualized portions of the left lower lobes are collapsed. The left ventricle is prominent in size. Hepatobiliary: There is a hypodense 4.7 cm lesion  within the left hepatic lobe. The remainder of the a patent parenchyma is grossly unremarkable on this noncontrast study. No CT evidence of calcified gallstones. The gallbladder is grossly unremarkable on this noncontrast study. No biliary ductal dilatation. Pancreas: No focal lesion. Normal pancreatic contour. No surrounding inflammatory changes. No main pancreatic ductal dilatation. Spleen: Normal in size without focal abnormality. Adrenals/Urinary Tract: No adrenal nodule bilaterally. Bilateral renal cortical scarring. At least mild bilateral, right greater than left, hydroureteronephrosis. No nephrolithiasis and no contour-deforming renal mass. No ureterolithiasis. The urinary bladder is decompressed with a Foley catheter terminating within its lumen. Urinary bladder walls appear thickened. Perivesicular fat stranding. Stomach/Bowel: Stomach is within normal limits. No evidence of bowel wall thickening or dilatation. Diffuse descending colon and sigmoid diverticulosis. The appendix is normal in caliber. Vascular/Lymphatic: No abdominal aorta or iliac aneurysm. Moderate to severe atherosclerotic plaque of the aorta and its branches. No abdominal, pelvic, or inguinal lymphadenopathy. Reproductive: The prostate is enlarged in size measuring up to 5.2 cm. Other: Diffuse mild haziness of the mesentery likely due to motion. No intraperitoneal free fluid. No intraperitoneal free gas. No organized fluid collection. Musculoskeletal: Mild subcutaneus soft tissue edema. Slightly asymmetrical and hypodensity of the left flank abdominal wall musculature with overlying 6.9 x 2.3 cm slightly higher than simple fluid density suggestive of possible hematoma formation. Soft tissue density within the anterior subcutaneus soft tissues likely related to medication injections. Possible tiny right fat  inguinal hernia. No suspicious lytic or blastic osseous lesions. No cortical erosion or destruction. No acute displaced fracture.  Multilevel degenerative changes of the spine. At least mild bilateral hip degenerative changes. IMPRESSION: 1. Limited evaluation due to respiratory motion artifact and noncontrast study. 2. Collecting system and urinary bladder findings consistent with known infection and urinary retention in the setting of prostatomegaly. 3. Slightly asymmetric and hypodense left flank abdominal wall musculature with overlying 6.9 x 2.3 cm slightly higher than simple fluid density suggestive of possible hematoma formation. No organized fluid collection, though limited evaluation on this noncontrast study. Superimposed infection is not excluded. 4. Mild to moderate size left pleural effusion that is incompletely visualized. 5. Indeterminate 4.7 cm left hepatic lobe lesion. 6. Other imaging findings of potential clinical significance: Prominent, likely enlarged, left ventricle. Possible tiny right fat inguinal hernia. Aortic Atherosclerosis (ICD10-I70.0). Electronically Signed   By: Iven Finn M.D.   On: 10/22/2020 17:40   DG Chest Port 1 View  Result Date: 10/15/2020 CLINICAL DATA:  79 year old male with questionable sepsis. EXAM: PORTABLE CHEST 1 VIEW COMPARISON:  10/05/2020 FINDINGS: Unchanged cardiomegaly. Similar appearance of indwelling right IJ tunneled hemodialysis catheter with the catheter tip in the superior vena cava. Interval improved aeration of the lungs bilaterally. Probable trace left pleural effusion. No new focal consolidations or pneumothorax. No acute osseous abnormality. IMPRESSION: No airspace opacities that would be concerning for pneumonia. Interval improved fluid status with decreased pulmonary edema and decreased size of probable trace left pleural effusion. Unchanged cardiomegaly. Electronically Signed   By: Ruthann Cancer MD   On: 11/09/2020 14:31   DG Hip Unilat W or Wo Pelvis 2-3 Views Right  Result Date: 10/20/2020 CLINICAL DATA:  79 year old male with right hip pain status post fall  yesterday. EXAM: DG HIP (WITH OR WITHOUT PELVIS) 2-3V RIGHT COMPARISON:  None. FINDINGS: There is no evidence of hip fracture or dislocation. 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 Degenerative changes of the lower lumbar spine soft tissues are unremarkable. IMPRESSION: 1. No acute fracture or malalignment. 2. Symmetric fraying of pelvic entheses as could be seen with renal osteodystrophy. Electronically Signed   By: Ruthann Cancer MD   On: 10/14/2020 14:59    ROS: Pertinent items are noted in HPI. Physical Exam: Vitals:   10/24/20 1200 10/24/20 1500 10/24/20 2107 10/25/20 0443  BP: (!) 106/41 (!) 109/59 (!) 107/45 (!) 122/46  Pulse: 86 91 94 80  Resp:  20 20 14   Temp: 98.8 F (37.1 C) 98.5 F (36.9 C) 98.5 F (36.9 C) 98.3 F (36.8 C)  TempSrc:  Oral  Oral  SpO2: 95% 96% 92% 93%  Weight:      Height:          Weight change:   Intake/Output Summary (Last 24 hours) at 10/25/2020 5956 Last data filed at 10/24/2020 1700 Gross per 24 hour  Intake 120 ml  Output 450 ml  Net -330 ml   BP (!) 122/46 (BP Location: Right Arm)   Pulse 80   Temp 98.3 F (36.8 C) (Oral)   Resp 14   Ht 5\' 9"  (1.753 m)   Wt 95 kg   SpO2 93%   BMI 30.93 kg/m  General appearance: alert, cooperative and no distress Head: Normocephalic, without obvious abnormality, atraumatic Resp: clear to auscultation bilaterally Cardio: regular rate and rhythm and no rub GI: soft, non-tender; bowel sounds normal; no masses,  no organomegaly Extremities: extremities normal, atraumatic, no cyanosis or edema and LUE AVF +T/B Dialysis Access:  Dialysis Orders: Center: [facility]  on TTS . EDW 97.5kg HD Bath 2K/2.5Ca  Time 4 hours Heparin none. Access RIJ TDC and maturing LUE AVF BFR 400 DFR 600    Epogen 1200   Units IV/HD    Assessment/Plan: 1.  UTI and urinary retention- currently on cefepime and responding 2.  ESRD -   Off schedule.  Will plan for HD today and get back on schedule Thursday this week. 3.  Hypertension/volume  - stable 4.  Anemia  - s/p blood transfusion, will continue ESA 5.  Metabolic bone disease -  Continue with home meds 6.  Nutrition -  Renal diet, carb modified 7. DM type 2- per primary svc 8. Right Hip pain - no fracture seen on imaging. 9. CAD with previous NSTEMI- during his hospitalization from 11/18-12/1/21.  LHC performed 11/22 demonstrated occluded RCA with grade 3 left to right collaterals, 50 % mid LAD.  To follow up as outpatient with cardiology. 10. Disposition- came from SNF and will return when stable.  Michael Potts, MD [place] Pager [phone] 10/25/2020, 9:09 AM

## 2020-10-25 NOTE — Evaluation (Signed)
Physical Therapy Evaluation Patient Details Name: Michael Trevino MRN: [id number] DOB: [birth year] Today's Date: 10/25/2020   History of Present Illness  Michael Trevino  is a 79 y.o. male, male with past medical history relevant for HTN, DM2,nephrotic syndrome, ESRD, started hemodialysis last month, hypothyroidism, CAD with recent admission due to NSTEMI, and HLD and obesity .  -Patient with recent hospitalization at [facility], where hemodialysis was initiated, where he was discharged to SNF, patient was brought by the facility due to fever, and hip pain, altered mental status, initially patient was altered per ED physician, but this has improved upon my evaluation, son was at bedside helps with the history, patient with fever 101.2 per EMS, for which she received Tylenol, son report patient transfused 2 units PRBC on Thursday, so he missed dialysis that day, so he was dialyzed on Friday, he was supposed to be dialyzed today as well, as well patient with complaints of right hip pain for last 2 to 3 days,.  - in ED patient was noticed to be with urinary retention, 1200 cc of foul-smelling purulent urine drained after Foley catheter insertion, work-up significant for potassium of 5.2, creatinine of 12.1, white blood cell count of 27.8, but normal lactic acid, hemoglobin at 9.8, chest x-ray with no acute findings, UA with elevated leukocytes, but no pyuria, but hip x-ray with no evidence of fracture, [facility] hospitalist consulted to admit.    Clinical Impression  Patient limited by c/o pain in R LE. Able to transition to edge of bed rolling to R side and pulling self up on bed rail with Mod verbal cues from PT. Demo good lateral scooting on bed to reposition self from sitting edge of bed to supine with verbal cues, but demo increased anterior trunk lean throughout. Demo decreased proprioception and awareness when attempting sit to stand, resulting in large anterior trunk lean and unable to stand secondary  to weakness. Patient will benefit from continued physical therapy in hospital and recommended venue below to increase strength, balance, endurance for safe ADLs and gait.    Follow Up Recommendations SNF    Equipment Recommendations  None recommended by PT    Recommendations for Other Services       Precautions / Restrictions Precautions Precautions: Fall Restrictions Weight Bearing Restrictions: No      Mobility  Bed Mobility Overal bed mobility: Needs Assistance Bed Mobility: Supine to Sit;Sit to Supine     Supine to sit: Max assist Sit to supine: Max assist   General bed mobility comments: once edge of bed, increased anterior lean. Use of side rail with verbal instructions    Transfers Overall transfer level: Needs assistance Equipment used: Rolling walker (2 wheeled)                Ambulation/Gait                Stairs            Wheelchair Mobility    Modified Rankin (Stroke Patients Only)       Balance Overall balance assessment: Needs assistance Sitting-balance support: Bilateral upper extremity supported;Feet supported   Sitting balance - Comments: Increased anterior lean in sitting, bilat UE support and MinA-ModA from PT at trunk to remain upright and not demo loss of balance anteriorly Postural control: Other (comment) (anterior lean)  Pertinent Vitals/Pain Faces Pain Scale: Hurts whole lot Pain Location: R leg Pain Descriptors / Indicators: Sore;Grimacing;Moaning Pain Intervention(s): Limited activity within patient's tolerance;Repositioned;Monitored during session    [place] expects to be discharged to:: Private residence Living Arrangements: Spouse/significant other Available Help at Discharge: Family;Available 24 hours/day Type of Home: Mobile home Home Access: Level entry     Home Layout: One level Home Equipment: Cane - single point;Walker - 2  wheels      Prior Function Level of Independence: Needs assistance   Gait / Transfers Assistance Needed: assisted transfers at SNF, mostly non ambulatory  ADL's / Homemaking Assistance Needed: Assisted transfers in SNF        Hand Dominance   Dominant Hand: Right    Extremity/Trunk Assessment   Upper Extremity Assessment Upper Extremity Assessment: Defer to OT evaluation    Lower Extremity Assessment Lower Extremity Assessment: Generalized weakness    Cervical / Trunk Assessment Cervical / Trunk Assessment: Kyphotic  Communication   Communication: No difficulties  Cognition Arousal/Alertness: Awake/alert Behavior During Therapy: WFL for tasks assessed/performed Overall Cognitive Status: Within Functional Limits for tasks assessed                                        General Comments      Exercises General Exercises - Lower Extremity Ankle Circles/Pumps: AROM;Both;10 reps;Supine (required PT assist to complete exercise initially)   Assessment/Plan    PT Assessment Patient needs continued PT services  PT Problem List Decreased strength;Decreased mobility;Decreased activity tolerance;Decreased balance       PT Treatment Interventions DME instruction;Gait training;Functional mobility training;Therapeutic activities;Therapeutic exercise;Balance training;Patient/family education;Wheelchair mobility training    PT Goals (Current goals can be found in the Care Plan section)  Acute Rehab PT Goals Patient Stated Goal: go home PT Goal Formulation: With patient Time For Goal Achievement: 11/08/20 Potential to Achieve Goals: Fair    Frequency Min 3X/week   Barriers to discharge        Co-evaluation               AM-PAC PT "6 Clicks" Mobility  Outcome Measure Help needed turning from your back to your side while in a flat bed without using bedrails?: A Lot Help needed moving from lying on your back to sitting on the side of a flat bed  without using bedrails?: A Lot Help needed moving to and from a bed to a chair (including a wheelchair)?: Total Help needed standing up from a chair using your arms (e.g., wheelchair or bedside chair)?: Total Help needed to walk in hospital room?: Total Help needed climbing 3-5 steps with a railing? : Total 6 Click Score: 8    End of Session   Activity Tolerance: Patient limited by fatigue;Patient limited by pain;Patient tolerated treatment well Patient left: in bed;with SCD's reapplied;with call bell/phone within reach Nurse Communication: Mobility status PT Visit Diagnosis: Unsteadiness on feet (R26.81);Muscle weakness (generalized) (M62.81);Other abnormalities of gait and mobility (R26.89)    Time: 0922-0950 PT Time Calculation (min) (ACUTE ONLY): 28 min   Charges:   PT Evaluation $PT Eval Moderate Complexity: 1 Mod PT Treatments $Therapeutic Activity: 23-37 mins       2:48 PM,10/25/20 Domenic Moras, PT, DPT Physical Therapist at [facility]

## 2020-10-25 NOTE — Plan of Care (Signed)
  Problem: Acute Rehab PT Goals(only PT should resolve) Goal: Pt will Roll Supine to Side Outcome: Progressing Flowsheets (Taken 10/25/2020 1452) Pt will Roll Supine to Side: with mod assist Goal: Pt Will Go Supine/Side To Sit Outcome: Progressing Flowsheets (Taken 10/25/2020 1452) Pt will go Supine/Side to Sit: with moderate assist Goal: Pt Will Go Sit To Supine/Side Outcome: Progressing Flowsheets (Taken 10/25/2020 1452) Pt will go Sit to Supine/Side: with moderate assist Goal: Patient Will Transfer Sit To/From Stand Outcome: Progressing Flowsheets (Taken 10/25/2020 1452) Patient will transfer sit to/from stand:  with moderate assist  with maximum assist Goal: Pt Will Transfer Bed To Chair/Chair To Bed Outcome: Progressing Flowsheets (Taken 10/25/2020 1452) Pt will Transfer Bed to Chair/Chair to Bed:  with max assist  with mod assist Goal: Pt Will Ambulate Outcome: Progressing Flowsheets (Taken 10/25/2020 1452) Pt will Ambulate:  10 feet  with moderate assist  with rolling walker  2:55 PM,10/25/20 Michael Trevino, PT, DPT Physical Therapist at [facility]

## 2020-10-25 NOTE — Progress Notes (Signed)
Inpatient Diabetes Program Recommendations  AACE/ADA: New Consensus Statement on Inpatient Glycemic Control (2015)  Target Ranges:  Prepandial:   less than 140 mg/dL      Peak postprandial:   less than 180 mg/dL (1-2 hours)      Critically ill patients:  140 - 180 mg/dL   Results for Michael Trevino, Michael Trevino (MRN [id number]) as of 10/25/2020 10:43  Ref. Range 10/24/2020 07:38 10/24/2020 11:37 10/24/2020 16:02 10/24/2020 21:09  Glucose-Capillary Latest Ref Range: 70 - 99 mg/dL 97 134 (H)  1 unit NOVOLOG  120 (H) 200 (H)    15 units LEVEMIR   Results for Michael Trevino, Michael Trevino (MRN [id number]) as of 10/25/2020 10:43  Ref. Range 10/25/2020 07:34  Glucose-Capillary Latest Ref Range: 70 - 99 mg/dL 65 (L)    Admit with: UTI/ Hip pain  History: DM, ESRD  Home DM Meds: Levemir 25 units QHS  Current Orders: Levemir 15 units QHS      Novolog Sensitive Correction Scale/ SSI (0-9 units) TID AC    MD- Note patient with Hypoglycemia this AM after getting 15 units Levemir last PM  Please consider decreasing Levemir to 10 units QHS    --Will follow patient during hospitalization--  Wyn Quaker RN, MSN, CDE Diabetes Coordinator Inpatient Glycemic Control Team Team Pager: [phone] (8a-5p)

## 2020-10-26 LAB — URINE CULTURE: Culture: >=100000 — AB

## 2020-10-26 LAB — CBC WITH DIFFERENTIAL/PLATELET
Abs Immature Granulocytes: 0.12 10*3/uL — ABNORMAL HIGH (ref 0.00–0.07)
Basophils Absolute: 0 10*3/uL (ref 0.0–0.1)
Basophils Relative: 0 %
Eosinophils Absolute: 0 10*3/uL (ref 0.0–0.5)
Eosinophils Relative: 0 %
HCT: 25.3 % — ABNORMAL LOW (ref 39.0–52.0)
Hemoglobin: 8.1 g/dL — ABNORMAL LOW (ref 13.0–17.0)
Immature Granulocytes: 1 %
Lymphocytes Relative: 12 %
Lymphs Abs: 1.2 10*3/uL (ref 0.7–4.0)
MCH: 28.7 pg (ref 26.0–34.0)
MCHC: 32 g/dL (ref 30.0–36.0)
MCV: 89.7 fL (ref 80.0–100.0)
Monocytes Absolute: 0.8 10*3/uL (ref 0.1–1.0)
Monocytes Relative: 8 %
Neutro Abs: 7.8 10*3/uL — ABNORMAL HIGH (ref 1.7–7.7)
Neutrophils Relative %: 79 %
Platelets: 237 10*3/uL (ref 150–400)
RBC: 2.82 MIL/uL — ABNORMAL LOW (ref 4.22–5.81)
RDW: 14 % (ref 11.5–15.5)
WBC: 10 10*3/uL (ref 4.0–10.5)
nRBC: 0 % (ref 0.0–0.2)

## 2020-10-26 LAB — RENAL FUNCTION PANEL
Albumin: 2 g/dL — ABNORMAL LOW (ref 3.5–5.0)
Anion gap: 13 (ref 5–15)
BUN: 58 mg/dL — ABNORMAL HIGH (ref 8–23)
CO2: 26 mmol/L (ref 22–32)
Calcium: 6.8 mg/dL — ABNORMAL LOW (ref 8.9–10.3)
Chloride: 94 mmol/L — ABNORMAL LOW (ref 98–111)
Creatinine, Ser: 8.42 mg/dL — ABNORMAL HIGH (ref 0.61–1.24)
GFR, Estimated: 6 mL/min — ABNORMAL LOW (ref >60)
Glucose, Bld: 93 mg/dL (ref 70–99)
Phosphorus: 5.4 mg/dL — ABNORMAL HIGH (ref 2.5–4.6)
Potassium: 3.7 mmol/L (ref 3.5–5.1)
Sodium: 133 mmol/L — ABNORMAL LOW (ref 135–145)

## 2020-10-26 LAB — GLUCOSE, CAPILLARY
Glucose-Capillary: 100 mg/dL — ABNORMAL HIGH (ref 70–99)
Glucose-Capillary: 85 mg/dL (ref 70–99)
Glucose-Capillary: 127 mg/dL — ABNORMAL HIGH (ref 70–99)
Glucose-Capillary: 96 mg/dL (ref 70–99)
Glucose-Capillary: 120 mg/dL — ABNORMAL HIGH (ref 70–99)

## 2020-10-26 MED ORDER — ASPIRIN 81 MG PO CHEW: 81.0000 mg | CHEWABLE_TABLET | Freq: Every day | ORAL | Status: DC

## 2020-10-26 MED ORDER — INSULIN DETEMIR 100 UNIT/ML ~~LOC~~ SOLN
12.0000 [IU] | Freq: Every day | SUBCUTANEOUS | Status: DC
  Administered 2020-10-26 – 2020-10-29 (×3): 12 [IU] via SUBCUTANEOUS
  Filled 2020-10-26 (×6): qty 0.12

## 2020-10-26 MED ORDER — SODIUM CHLORIDE 0.9 % IV SOLN
2.0000 g | INTRAVENOUS | Status: DC
  Administered 2020-10-28: 17:00:00 2 g via INTRAVENOUS
  Filled 2020-10-26: qty 2

## 2020-10-26 NOTE — Progress Notes (Addendum)
Patient's urine is dark red, bloody.  Order was placed by the provider to remove the foley.  Foley was originally placed for urinary retention in the ED.  Provider was notified of patients urine and asked if a urology consult is need before foley is removed.  Awaiting response from provider before removal of foley.    Provider cancelled remove foley order.  Foley remains in place.

## 2020-10-26 NOTE — Progress Notes (Signed)
   10/25/20 2335  Assess: MEWS Score  Temp 100.2 F (37.9 C)  BP (!) 142/50  Pulse Rate (!) 114  Resp 20  SpO2 100 %  O2 Device Room Air  Assess: MEWS Score  MEWS Temp 0  MEWS Systolic 0  MEWS Pulse 2  MEWS RR 0  MEWS LOC 0  MEWS Score 2  MEWS Score Color Yellow  Assess: if the MEWS score is Yellow or Red  Were vital signs taken at a resting state? Yes  Focused Assessment No change from prior assessment  Early Detection of Sepsis Score *See Row Information* Medium  MEWS guidelines implemented *See Row Information* No, previously yellow, continue vital signs every 4 hours

## 2020-10-26 NOTE — Progress Notes (Signed)
Patient ID: Michael Trevino, male   DOB: Jan 08, 1941, 79 y.o.   MRN: [id number] S: Feels better, no complaints.  O:BP (!) 135/51 (BP Location: Right Arm)   Pulse 94   Temp 99.7 F (37.6 C) (Oral)   Resp 16   Ht 5\' 9"  (1.753 m)   Wt 94.7 kg   SpO2 91%   BMI 30.83 kg/m   Intake/Output Summary (Last 24 hours) at 10/26/2020 0854 Last data filed at 10/26/2020 0700 Gross per 24 hour  Intake 700 ml  Output 2500 ml  Net -1800 ml   Intake/Output: I/O last 3 completed shifts: In: 41 [P.O.:600; IV Piggyback:100] Out: 2500 [Urine:500; Other:2000]  Intake/Output this shift:  No intake/output data recorded. Weight change:  Gen: NAD CVS: RRR Resp: cta Abd: +BS, soft, NT/ND Ext: no edema, LUE AVF +T/B  Recent Labs  Lab 10/14/2020 1503 10/24/20 0507 10/25/20 0529 10/26/20 0558  NA 132* 131*  131* 133* 133*  K 5.2* 4.3  4.4 4.3 3.7  CL 92* 94*  94* 94* 94*  CO2 22 19*  19* 20* 26  GLUCOSE 120* 111*  110* 74 93  BUN 79* 88*  86* 105* 58*  CREATININE 12.11* 12.81*  12.82* 13.54* 8.42*  ALBUMIN 2.7* 2.0* 2.0* 2.0*  CALCIUM 7.0* 6.5*  6.5* 6.5* 6.8*  PHOS  --  8.3* 9.1* 5.4*  AST 43*  --   --   --   ALT 29  --   --   --    Liver Function Tests: Recent Labs  Lab 10/22/2020 1503 10/24/20 0507 10/25/20 0529 10/26/20 0558  AST 43*  --   --   --   ALT 29  --   --   --   ALKPHOS 75  --   --   --   BILITOT 0.7  --   --   --   PROT 7.8  --   --   --   ALBUMIN 2.7* 2.0* 2.0* 2.0*   No results for input(s): LIPASE, AMYLASE in the last 168 hours. No results for input(s): AMMONIA in the last 168 hours. CBC: Recent Labs  Lab 11/11/2020 1503 10/24/20 0507 10/25/20 0529 10/26/20 0558  WBC 27.8* 21.7* 16.0* 10.0  NEUTROABS 23.6*  --  13.6* 7.8*  HGB 9.8* 8.0* 7.6* 8.1*  HCT 31.2* 25.2* 24.2* 25.3*  MCV 92.3 91.0 92.0 89.7  PLT 268 217 213 237   Cardiac Enzymes: No results for input(s): CKTOTAL, CKMB, CKMBINDEX, TROPONINI in the last 168 hours. CBG: Recent Labs  Lab  10/25/20 1104 10/25/20 1620 10/25/20 2332 10/26/20 0309 10/26/20 0740  GLUCAP 138* 121* 110* 100* 85    Iron Studies: No results for input(s): IRON, TIBC, TRANSFERRIN, FERRITIN in the last 72 hours. Studies/Results: No results found. Marland Kitchen aspirin  81 mg Oral Daily  . atorvastatin  80 mg Oral Daily  . calcitRIOL  0.5 mcg Oral Daily  . Chlorhexidine Gluconate Cloth  6 each Topical Daily  . feeding supplement (NEPRO CARB STEADY)  237 mL Oral BID BM  . insulin aspart  0-9 Units Subcutaneous TID WC  . insulin detemir  15 Units Subcutaneous QHS  . levothyroxine  175 mcg Oral Q0600  . metoprolol tartrate  100 mg Oral BID  . tamsulosin  0.4 mg Oral QPC supper    BMET    Component Value Date/Time   NA 133 (L) 10/26/2020 0558   NA 142 07/12/2020 1209   K 3.7 10/26/2020 0558   CL 94 (  L) 10/26/2020 0558   CO2 26 10/26/2020 0558   GLUCOSE 93 10/26/2020 0558   BUN 58 (H) 10/26/2020 0558   BUN 54 (H) 07/12/2020 1209   CREATININE 8.42 (H) 10/26/2020 0558   CALCIUM 6.8 (L) 10/26/2020 0558   GFRNONAA 6 (L) 10/26/2020 0558   GFRAA 13 (L) 07/12/2020 1209   CBC    Component Value Date/Time   WBC 10.0 10/26/2020 0558   RBC 2.82 (L) 10/26/2020 0558   HGB 8.1 (L) 10/26/2020 0558   HGB 10.1 (L) 07/12/2020 1209   HCT 25.3 (L) 10/26/2020 0558   HCT 30.2 (L) 07/12/2020 1209   PLT 237 10/26/2020 0558   PLT 198 07/12/2020 1209   MCV 89.7 10/26/2020 0558   MCV 87 07/12/2020 1209   MCH 28.7 10/26/2020 0558   MCHC 32.0 10/26/2020 0558   RDW 14.0 10/26/2020 0558   RDW 15.1 07/12/2020 1209   LYMPHSABS 1.2 10/26/2020 0558   LYMPHSABS 2.7 07/12/2020 1209   MONOABS 0.8 10/26/2020 0558   EOSABS 0.0 10/26/2020 0558   EOSABS 0.1 07/12/2020 1209   BASOSABS 0.0 10/26/2020 0558   BASOSABS 0.0 07/12/2020 1209     Dialysis Orders: Center: [facility]  on TTS . EDW 97.5kg HD Bath 2K/2.5Ca  Time 4 hours Heparin none. Access RIJ TDC and maturing LUE AVF BFR 400 DFR 600    Epogen 1200   Units IV/HD    Assessment/Plan: 1.  UTI and urinary retention- currently on cefepime and responding.  Culture + for Pseudomonas 2.  ESRD -   Off schedule.  Tolerated HD well yesterday and will get back on schedule Thursday. 3.  Hypertension/volume  - stable 4.  Anemia  - s/p blood transfusion, will continue ESA 5.  Metabolic bone disease -  Continue with home meds 6.  Nutrition -  Renal diet, carb modified 7. DM type 2- per primary svc 8. Right Hip pain - no fracture seen on imaging. 9. CAD with previous NSTEMI- during his hospitalization from 11/18-12/1/21.  LHC performed 11/22 demonstrated occluded RCA with grade 3 left to right collaterals, 50 % mid LAD.  To follow up as outpatient with cardiology. 10. Disposition- came from SNF and will return when stable.  Donetta Potts, MD [employer] [phone]

## 2020-10-26 NOTE — Progress Notes (Signed)
PROGRESS NOTE   Michael Trevino  PJA:[id number] DOB: 1941/04/28 DOA: 10/31/2020 PCP: Dettinger, Fransisca Kaufmann, MD   Chief Complaint  Patient presents with  . Hip Pain  . Fever   Brief Admission History:   79 y.o. male, malewith past medical history relevant for HTN, DM2,nephrotic syndrome, ESRD, started hemodialysis last month, hypothyroidism, CAD with recent admission due to NSTEMI, and HLD and obesity . -Patient with recent hospitalization at [facility], where hemodialysis was initiated, where he was discharged to SNF, patient was brought by the facility due to fever, and hip pain, altered mental status, initially patient was altered per ED physician, but this has improved upon my evaluation, son was at bedside helps with the history, patient with fever 101.2 per EMS, for which she received Tylenol, son report patient transfused 2 units PRBC on Thursday, so he missed dialysis that day, so he was dialyzed on Friday, he was supposed to be dialyzed today as well, as well patient with complaints of right hip pain for last 2 to 3 days. - in ED patient was noticed to be with urinary retention, 1200 cc of foul-smelling purulent urine drained after Foley catheter insertion, work-up significant for potassium of 5.2, creatinine of 12.1, white blood cell count of 27.8, but normal lactic acid, hemoglobin at 9.8, chest x-ray with no acute findings, UA with elevated leukocytes, but no pyuria, but hip x-ray with no evidence of fracture, [facility] hospitalist consulted to admit.  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 Sepsis ruled out 2. Pseudomonas UTI - secondary to urinary retention - pyuria found during cath placement - continue cefepime,  follow up sensitivities.  3. Acute urinary retention - foley cath in place.  Will need urology follow up after discharge.  DC foley and try voiding trial today, if fails would need to discharge with Foley until outpatient urology follow up.  4. ESRD on HD - TTS, consult to nephrology for hemodialysis treatments.  5. Essential hypertension - resumed home medications.  6. Hip pain - no fracture seen on imaging.  PT evaluation requested. Recommending SNF.  7. Uncontrolled type 2 diabetes mellitus with CKD - continue to monitor closely.   I have reduced levemir to 12 units.   DVT prophylaxis:  SCDs Code Status: full  Family Communication: updated wife/daughter 12/13  Disposition:  Anticipating SNF placement for rehab  Status is: Inpatient  Remains inpatient appropriate because:Inpatient level of care appropriate due to severity of illness  Dispo: The patient is from: ALF              Anticipated d/c is to: SNF              Anticipated d/c date is: 1 day              Patient currently is not medically stable to d/c.  He remains on IV antibiotics pending C&S results.   Consultants:   nephrology  Procedures:     Antimicrobials:  Cefepime 12/11>> Vancomycin 12/11  Subjective: Pt reported that he feels tired today.  He would like to eat a bit more.  No other specific complaints.  Says legs not hurting that bad today.  Objective: Vitals:   10/25/20 2315 10/25/20 2335 10/26/20 0023 10/26/20 0549  BP: (!) 140/52 (!) 142/50 (!) 127/46 (!) 135/51  Pulse: (!) 115 (!) 114 92 94  Resp: 16 20 18 16   Temp: 99.1 F (37.3 C) 100.2 F (37.9 C) 99.8 F (37.7 C) 99.7 F (37.6 C)  TempSrc: Oral Oral Oral Oral  SpO2: 97% 100% 95% 91%  Weight: 94.7 kg     Height:        Intake/Output Summary (Last 24 hours) at 10/26/2020 1056 Last data filed at 10/26/2020 6269 Gross per 24 hour  Intake 700 ml  Output 2500 ml  Net -1800 ml   Filed Weights   10/15/2020 2039 10/25/20 1950 10/25/20 2315   Weight: 95 kg 96.6 kg 94.7 kg   Examination:  General exam: chronically ill appearing male, Appears calm and comfortable  Respiratory system: Clear to auscultation. Respiratory effort normal. Cardiovascular system: normal S1 & S2 heard. No JVD, murmurs, rubs, gallops or clicks. No pedal edema. Gastrointestinal system: Abdomen is nondistended, soft and nontender. No organomegaly or masses felt. Normal bowel sounds heard. Central nervous system: Alert and oriented. No focal neurological deficits. Extremities: Symmetric 5 x 5 power. Skin: No rashes, lesions or ulcers Psychiatry: Judgement and insight appear normal. Mood & affect appropriate.   Data Reviewed: I have personally reviewed following labs and imaging studies  CBC: Recent Labs  Lab 10/22/2020 1503 10/24/20 0507 10/25/20 0529 10/26/20 0558  WBC 27.8* 21.7* 16.0* 10.0  NEUTROABS 23.6*  --  13.6* 7.8*  HGB 9.8* 8.0* 7.6* 8.1*  HCT 31.2* 25.2* 24.2* 25.3*  MCV 92.3 91.0 92.0 89.7  PLT 268 217 213 485    Basic Metabolic Panel: Recent Labs  Lab 10/13/2020 1503 10/24/20 0507 10/25/20 0529 10/26/20 0558  NA 132* 131*  131* 133* 133*  K 5.2* 4.3  4.4 4.3 3.7  CL 92* 94*  94* 94* 94*  CO2 22 19*  19* 20* 26  GLUCOSE 120* 111*  110* 74 93  BUN 79* 88*  86* 105* 58*  CREATININE 12.11* 12.81*  12.82* 13.54* 8.42*  CALCIUM 7.0* 6.5*  6.5* 6.5* 6.8*  PHOS  --  8.3* 9.1* 5.4*    GFR: Estimated Creatinine Clearance: 8.1 mL/min (A) (by C-G formula based on SCr of 8.42 mg/dL (H)).  Liver Function Tests: Recent Labs  Lab 11/10/2020 1503 10/24/20 0507 10/25/20 0529 10/26/20 0558  AST 43*  --   --   --   ALT 29  --   --   --   ALKPHOS 75  --   --   --   BILITOT 0.7  --   --   --   PROT 7.8  --   --   --   ALBUMIN 2.7* 2.0* 2.0* 2.0*    CBG: Recent Labs  Lab 10/25/20 1104 10/25/20 1620 10/25/20 2332 10/26/20 0309 10/26/20 0740  GLUCAP 138* 121* 110* 100* 85    Recent Results (from the past 240  hour(s))  Urine culture     Status: Abnormal   Collection Time: 10/16/2020  1:44 PM   Specimen: Urine, Catheterized  Result Value Ref Range Status   Specimen Description   Final    URINE, CATHETERIZED Performed at [facility], [address] [place], [place] [address]    Special Requests   Final    NONE Performed at [facility], [address], [place], [place] [address]    Culture >=100,000 COLONIES/mL PSEUDOMONAS AERUGINOSA (A)  Final  Report Status 10/26/2020 FINAL  Final   Organism ID, Bacteria PSEUDOMONAS AERUGINOSA (A)  Final      Susceptibility   Pseudomonas aeruginosa - MIC*    CEFTAZIDIME 4 SENSITIVE Sensitive     CIPROFLOXACIN <=0.25 SENSITIVE Sensitive     GENTAMICIN <=1 SENSITIVE Sensitive     IMIPENEM 2 SENSITIVE Sensitive     PIP/TAZO 8 SENSITIVE Sensitive     CEFEPIME 2 SENSITIVE Sensitive     * >=100,000 COLONIES/mL PSEUDOMONAS AERUGINOSA  Blood culture (routine single)     Status: None (Preliminary result)   Collection Time: 10/20/2020  3:03 PM   Specimen: Right Antecubital; Blood  Result Value Ref Range Status   Specimen Description   Final    RIGHT ANTECUBITAL BOTTLES DRAWN AEROBIC AND ANAEROBIC   Special Requests Blood Culture adequate volume  Final   Culture   Final    NO GROWTH 2 DAYS Performed at [facility], [address] [place], [place] [address]    Report Status PENDING  Incomplete  Resp Panel by RT-PCR (Flu A&B, Covid) Nasopharyngeal Swab     Status: None   Collection Time: 10/30/2020  3:56 PM   Specimen: Nasopharyngeal Swab; Nasopharyngeal(NP) swabs in vial transport medium  Result Value Ref Range Status   SARS Coronavirus 2 by RT PCR NEGATIVE NEGATIVE Final    Comment: (NOTE) SARS-CoV-2 target nucleic acids are NOT DETECTED.  The SARS-CoV-2 RNA is generally detectable in upper respiratory specimens during the acute phase of infection. The lowest concentration of SARS-CoV-2 viral copies this assay can detect is 138 copies/mL. A negative  result does not preclude SARS-Cov-2 infection and should not be used as the sole basis for treatment or other patient management decisions. A negative result may occur with  improper specimen collection/handling, submission of specimen other than nasopharyngeal swab, presence of viral mutation(s) within the areas targeted by this assay, and inadequate number of viral copies(<138 copies/mL). A negative result must be combined with clinical observations, patient history, and epidemiological information. The expected result is Negative.  Fact Sheet for Patients:  EntrepreneurPulse.com.au  Fact Sheet for Healthcare Providers:  IncredibleEmployment.be  This test is no t yet approved or cleared by the [place] FDA and  has been authorized for detection and/or diagnosis of SARS-CoV-2 by FDA under an Emergency Use Authorization (EUA). This EUA will remain  in effect (meaning this test can be used) for the duration of the COVID-19 declaration under Section 564(b)(1) of the Act, 21 U.S.C.section 360bbb-3(b)(1), unless the authorization is terminated  or revoked sooner.       Influenza A by PCR NEGATIVE NEGATIVE Final   Influenza B by PCR NEGATIVE NEGATIVE Final    Comment: (NOTE) The Xpert Xpress SARS-CoV-2/FLU/RSV plus assay is intended as an aid in the diagnosis of influenza from Nasopharyngeal swab specimens and should not be used as a sole basis for treatment. Nasal washings and aspirates are unacceptable for Xpert Xpress SARS-CoV-2/FLU/RSV testing.  Fact Sheet for Patients: EntrepreneurPulse.com.au  Fact Sheet for Healthcare Providers: IncredibleEmployment.be  This test is not yet approved or cleared by the [place] FDA and has been authorized for detection and/or diagnosis of SARS-CoV-2 by FDA under an Emergency Use Authorization (EUA). This EUA will remain in effect (meaning this test can be used)  for the duration of the COVID-19 declaration under Section 564(b)(1) of the Act, 21 U.S.C. section 360bbb-3(b)(1), unless the authorization is terminated or revoked.  Performed at [facility], [address], [place], [place] [address]  MRSA PCR Screening     Status: None   Collection Time: 11/10/2020 10:38 PM   Specimen: Nasal Mucosa; Nasopharyngeal  Result Value Ref Range Status   MRSA by PCR NEGATIVE NEGATIVE Final    Comment:        The GeneXpert MRSA Assay (FDA approved for NASAL specimens only), is one component of a comprehensive MRSA colonization surveillance program. It is not intended to diagnose MRSA infection nor to guide or monitor treatment for MRSA infections. Performed at [facility], [address] [place],  [address]      Radiology Studies: No results found. Scheduled Meds: . aspirin  81 mg Oral Daily  . atorvastatin  80 mg Oral Daily  . calcitRIOL  0.5 mcg Oral Daily  . Chlorhexidine Gluconate Cloth  6 each Topical Daily  . feeding supplement (NEPRO CARB STEADY)  237 mL Oral BID BM  . insulin aspart  0-9 Units Subcutaneous TID WC  . insulin detemir  15 Units Subcutaneous QHS  . levothyroxine  175 mcg Oral Q0600  . metoprolol tartrate  100 mg Oral BID  . tamsulosin  0.4 mg Oral QPC supper   Continuous Infusions: . sodium chloride    . sodium chloride    . [START ON 10/28/2020] ceFEPime (MAXIPIME) IV    . methocarbamol (ROBAXIN) IV       LOS: 3 days   Time spent: 35 mins  Shantrice Rodenberg Wynetta Emery, MD How to contact the [facility] Attending or Consulting provider [place] or covering provider during after hours [place], for this patient?  1. Check the care team in [facility] and look for a) attending/consulting TRH provider listed and b) the [facility] team listed 2. Log into [email or web address] and use [place]'s universal password to access. If you do not have the password, please contact the hospital operator. 3. Locate the [facility] provider you are looking for under [facility] and page to a number that you can be directly reached. 4. If you still have difficulty reaching the provider, please page the [facility] (Director on Call) for the Hospitalists listed on amion for assistance.  10/26/2020, 10:56 AM

## 2020-10-26 NOTE — Procedures (Signed)
   HEMODIALYSIS TREATMENT NOTE:  Uneventful 3.5 hour heparin-free HD completed via RIJ TDC; exit site unremarkable. Goal met: 2 liters removed without interruption in UF.  All blood was returned.  No changes from pre-dialysis assessment.  Rockwell Alexandria, RN

## 2020-10-26 NOTE — Progress Notes (Signed)
Pt coughed up small amount of blood.  Hold aspirin for a couple of days and reassess.  CBC in AM.   Murvin Natal MD

## 2020-10-26 NOTE — Progress Notes (Signed)
Summoned by patient visitor to room to see that patient had coughed up large, bright red blood clot. Patient stable and says he has no complaints, no longer coughing. Notified MD.

## 2020-10-27 ENCOUNTER — Other Ambulatory Visit: Payer: Self-pay | Admitting: Nurse Practitioner

## 2020-10-27 LAB — CBC WITH DIFFERENTIAL/PLATELET
Abs Immature Granulocytes: 0.08 10*3/uL — ABNORMAL HIGH (ref 0.00–0.07)
Basophils Absolute: 0 10*3/uL (ref 0.0–0.1)
Basophils Relative: 0 %
Eosinophils Absolute: 0 10*3/uL (ref 0.0–0.5)
Eosinophils Relative: 0 %
HCT: 24.9 % — ABNORMAL LOW (ref 39.0–52.0)
Hemoglobin: 8.1 g/dL — ABNORMAL LOW (ref 13.0–17.0)
Immature Granulocytes: 1 %
Lymphocytes Relative: 19 %
Lymphs Abs: 1.6 10*3/uL (ref 0.7–4.0)
MCH: 29.7 pg (ref 26.0–34.0)
MCHC: 32.5 g/dL (ref 30.0–36.0)
MCV: 91.2 fL (ref 80.0–100.0)
Monocytes Absolute: 0.8 10*3/uL (ref 0.1–1.0)
Monocytes Relative: 10 %
Neutro Abs: 5.8 10*3/uL (ref 1.7–7.7)
Neutrophils Relative %: 70 %
Platelets: 220 10*3/uL (ref 150–400)
RBC: 2.73 MIL/uL — ABNORMAL LOW (ref 4.22–5.81)
RDW: 14 % (ref 11.5–15.5)
WBC: 8.4 10*3/uL (ref 4.0–10.5)
nRBC: 0 % (ref 0.0–0.2)

## 2020-10-27 LAB — RENAL FUNCTION PANEL
Albumin: 2.1 g/dL — ABNORMAL LOW (ref 3.5–5.0)
Anion gap: 17 — ABNORMAL HIGH (ref 5–15)
BUN: 71 mg/dL — ABNORMAL HIGH (ref 8–23)
CO2: 23 mmol/L (ref 22–32)
Calcium: 6.8 mg/dL — ABNORMAL LOW (ref 8.9–10.3)
Chloride: 94 mmol/L — ABNORMAL LOW (ref 98–111)
Creatinine, Ser: 10.34 mg/dL — ABNORMAL HIGH (ref 0.61–1.24)
GFR, Estimated: 5 mL/min — ABNORMAL LOW (ref >60)
Glucose, Bld: 89 mg/dL (ref 70–99)
Phosphorus: 6.3 mg/dL — ABNORMAL HIGH (ref 2.5–4.6)
Potassium: 3.9 mmol/L (ref 3.5–5.1)
Sodium: 134 mmol/L — ABNORMAL LOW (ref 135–145)

## 2020-10-27 LAB — GLUCOSE, CAPILLARY
Glucose-Capillary: 92 mg/dL (ref 70–99)
Glucose-Capillary: 108 mg/dL — ABNORMAL HIGH (ref 70–99)
Glucose-Capillary: 124 mg/dL — ABNORMAL HIGH (ref 70–99)
Glucose-Capillary: 107 mg/dL — ABNORMAL HIGH (ref 70–99)
Glucose-Capillary: 167 mg/dL — ABNORMAL HIGH (ref 70–99)

## 2020-10-27 NOTE — TOC Progression Note (Signed)
Transition of Care [facility]) - Progression Note    Patient Details  Name: Rontrell Moquin MRN: [id number] Date of Birth: 02/06/1941  Transition of Care [facility] CM/SW Contact  Salome Arnt, [place] Phone Number: 10/27/2020, 12:37 PM  Clinical Narrative:  LCSW left voicemail for Mardene Celeste at [facility] providing update on pt. TOC will continue to follow and assist with transfer back to SNF when medically stable.         Barriers to Discharge: Continued Medical Work up  Expected Discharge Plan and Services                                                 Social Determinants of Health (SDOH) Interventions    Readmission Risk Interventions Readmission Risk Prevention Plan 10/25/2020  Transportation Screening Complete  Palliative Care Screening Not Applicable  Medication Review (RN Care Manager) Complete  Some recent data might be hidden

## 2020-10-27 NOTE — Plan of Care (Signed)
  Problem: Acute Rehab OT Goals (only OT should resolve) Goal: Pt. Will Perform Eating Flowsheets (Taken 10/27/2020 0830) Pt Will Perform Eating:  with set-up  sitting Goal: Pt. Will Perform Grooming Flowsheets (Taken 10/27/2020 0830) Pt Will Perform Grooming:  with set-up  sitting Goal: Pt. Will Perform Upper Body Dressing Flowsheets (Taken 10/27/2020 0830) Pt Will Perform Upper Body Dressing:  with set-up  with supervision  sitting Goal: Pt. Will Transfer To Toilet Flowsheets (Taken 10/27/2020 0830) Pt Will Transfer to Toilet:  with mod assist  stand pivot transfer  bedside commode Goal: Pt. Will Perform Toileting-Clothing Manipulation Flowsheets (Taken 10/27/2020 0830) Pt Will Perform Toileting - Clothing Manipulation and hygiene:  with min guard assist  with min assist  sitting/lateral leans

## 2020-10-27 NOTE — Progress Notes (Signed)
PROGRESS NOTE    Michael Trevino  OZY:[id number] DOB: Jan 19, 1941 DOA: 10/20/2020 PCP: Dettinger, Fransisca Kaufmann, MD   Brief Narrative:  79 y.o.male,malewith past medical history relevant for HTN, DM2,nephrotic syndrome,ESRD, started hemodialysis last month, hypothyroidism, CADwith recent admission due toNSTEMI, and HLD and obesity. -Patient with recent hospitalization at [facility], where hemodialysis was initiated, where he was discharged to SNF, patient was brought by the facility due to fever, and hip pain, altered mental status, initially patient was altered per ED physician, but this has improved upon my evaluation, son was at bedside helps with the history, patient with fever 101.2 per EMS, for which she received Tylenol, son report patient transfused 2 units PRBC on Thursday, so he missed dialysis that day, so he was dialyzed on Friday, he was supposed to be dialyzed today as well,as well patient with complaints of right hip pain for last 2 to 3 days. - in EDpatient was noticed to be with urinary retention, 1200 cc of foul-smelling purulent urine drained after Foley catheter insertion, work-up significant for potassium of 5.2, creatinine of 12.1, white blood cell count of 27.8, but normal lactic acid, hemoglobin at 9.8, chest x-ray with no acute findings, UA with elevated leukocytes, but no pyuria, but hip x-ray with no evidence of fracture, [facility] hospitalist consulted to admit.  -Patient today with some hematuria noted in Foley catheter bag, he continues to remain confused and has very poor oral intake.  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 Sepsis ruled out 2. Pseudomonas UTI - secondary to  urinary retention - pyuria found during cath placement - continue cefepime as bacteria is noted to be pansensitive. 3. Acute metabolic encephalopathy versus progression of dementia.  Appears to have some component of failure to thrive as well and will likely not do well in rehab setting.  Palliative care consult appreciated for goals of care. 4. Acute urinary retention - foley cath in place.  Now with hematuria with questionable Foley trauma versus other cause.   May need renal ultrasound.  Will need urology follow up after discharge.  DC foley and try voiding trial today, if fails would need to discharge with Foley until outpatient urology follow up.  5. ESRD on HD - TTS, consult to nephrology for hemodialysis treatments.  Further hemodialysis on 12/16. 6. Essential hypertension - resumed home medications.  7. Hip pain - no fracture seen on imaging.  PT recommending SNF.  8. Uncontrolled type 2 diabetes mellitus with CKD - continue to monitor closely.   I have reduced levemir to 12 units.   DVT prophylaxis: SCDs Code Status: Full Family Communication: Discussed with daughter at bedside 12/15 Disposition Plan:  Status is: Inpatient  Remains inpatient appropriate because:Altered mental status and Inpatient level of care appropriate due to severity of illness   Dispo: The patient is from: Home              Anticipated d/c is to: SNF  Vs home hospice              Anticipated d/c date is: 2 days              Patient currently is not  medically stable to d/c.  He continues to have altered mentation and is not having an adequate appetite.  Consultants:   Palliative care  Nephrology  Procedures:   See below  Antimicrobials:  Anti-infectives (From admission, onward)   Start     Dose/Rate Route Frequency Ordered Stop   10/28/20 1200  ceFEPIme (MAXIPIME) 2 g in sodium chloride 0.9 % 100 mL IVPB        2 g 200 mL/hr over 30 Minutes Intravenous Every T-Th-Sa (Hemodialysis) 10/26/20 0815      10/25/20 1200  ceFEPIme (MAXIPIME) 2 g in sodium chloride 0.9 % 100 mL IVPB  Status:  Discontinued        2 g 200 mL/hr over 30 Minutes Intravenous Every M-W-F (Hemodialysis) 10/24/20 1247 10/26/20 0815   11/07/2020 1800  vancomycin (VANCOREADY) IVPB 1500 mg/300 mL        1,500 mg 150 mL/hr over 120 Minutes Intravenous  Once 11/04/2020 1631 10/18/2020 1925   10/21/2020 1645  ceFEPIme (MAXIPIME) 2 g in sodium chloride 0.9 % 100 mL IVPB        2 g 200 mL/hr over 30 Minutes Intravenous  Once 10/22/2020 1631 11/12/2020 1713   10/24/2020 1615  cefTRIAXone (ROCEPHIN) 1 g in sodium chloride 0.9 % 100 mL IVPB  Status:  Discontinued        1 g 200 mL/hr over 30 Minutes Intravenous  Once 10/30/2020 1602 10/31/2020 1626       Subjective: Patient seen and evaluated today with oral intake and confusion that is persisting according to daughter at bedside.  He is also noted to have hematuria in Foley bag which is new since yesterday per nursing staff.  Objective: Vitals:   10/26/20 1700 10/26/20 2132 10/27/20 0348 10/27/20 1000  BP: (!) 132/42 (!) 128/43 (!) 113/44 (!) 113/52  Pulse:  90 95 94  Resp:  20 20 20   Temp:  100.3 F (37.9 C) 99.2 F (37.3 C) 99.2 F (37.3 C)  TempSrc:  Oral Oral Oral  SpO2:  96% 92% 97%  Weight:      Height:        Intake/Output Summary (Last 24 hours) at 10/27/2020 1126 Last data filed at 10/27/2020 0400 Gross per 24 hour  Intake --  Output 200 ml  Net -200 ml   Filed Weights   10/31/2020 2039 10/25/20 1950 10/25/20 2315  Weight: 95 kg 96.6 kg 94.7 kg    Examination:  General exam: Appears calm and comfortable  Respiratory system: Clear to auscultation. Respiratory effort normal. Cardiovascular system: S1 & S2 heard, RRR.  Gastrointestinal system: Abdomen is nondistended, soft and nontender.  Central nervous system: Somnolent but arousable, confused Extremities: No edema Skin: No rashes, lesions or ulcers Psychiatry: Cannot be assessed.    Data Reviewed: I have  personally reviewed following labs and imaging studies  CBC: Recent Labs  Lab 11/12/2020 1503 10/24/20 0507 10/25/20 0529 10/26/20 0558 10/27/20 0407  WBC 27.8* 21.7* 16.0* 10.0 8.4  NEUTROABS 23.6*  --  13.6* 7.8* 5.8  HGB 9.8* 8.0* 7.6* 8.1* 8.1*  HCT 31.2* 25.2* 24.2* 25.3* 24.9*  MCV 92.3 91.0 92.0 89.7 91.2  PLT 268 217 213 237 993   Basic Metabolic Panel: Recent Labs  Lab 11/02/2020 1503 10/24/20 0507 10/25/20 0529 10/26/20 0558 10/27/20 0407  NA 132* 131*  131* 133* 133* 134*  K 5.2* 4.3  4.4 4.3 3.7 3.9  CL 92* 94*  94* 94* 94* 94*  CO2 22 19*  19*  20* 26 23  GLUCOSE 120* 111*  110* 74 93 89  BUN 79* 88*  86* 105* 58* 71*  CREATININE 12.11* 12.81*  12.82* 13.54* 8.42* 10.34*  CALCIUM 7.0* 6.5*  6.5* 6.5* 6.8* 6.8*  PHOS  --  8.3* 9.1* 5.4* 6.3*   GFR: Estimated Creatinine Clearance: 6.6 mL/min (A) (by C-G formula based on SCr of 10.34 mg/dL (H)). Liver Function Tests: Recent Labs  Lab 10/30/2020 1503 10/24/20 0507 10/25/20 0529 10/26/20 0558 10/27/20 0407  AST 43*  --   --   --   --   ALT 29  --   --   --   --   ALKPHOS 75  --   --   --   --   BILITOT 0.7  --   --   --   --   PROT 7.8  --   --   --   --   ALBUMIN 2.7* 2.0* 2.0* 2.0* 2.1*   No results for input(s): LIPASE, AMYLASE in the last 168 hours. No results for input(s): AMMONIA in the last 168 hours. Coagulation Profile: Recent Labs  Lab 10/25/2020 1503  INR 1.2   Cardiac Enzymes: No results for input(s): CKTOTAL, CKMB, CKMBINDEX, TROPONINI in the last 168 hours. BNP (last 3 results) No results for input(s): PROBNP in the last 8760 hours. HbA1C: No results for input(s): HGBA1C in the last 72 hours. CBG: Recent Labs  Lab 10/26/20 1128 10/26/20 1647 10/26/20 2131 10/27/20 0314 10/27/20 1108  GLUCAP 127* 96 120* 92 124*   Lipid Profile: No results for input(s): CHOL, HDL, LDLCALC, TRIG, CHOLHDL, LDLDIRECT in the last 72 hours. Thyroid Function Tests: No results for input(s):  TSH, T4TOTAL, FREET4, T3FREE, THYROIDAB in the last 72 hours. Anemia Panel: No results for input(s): VITAMINB12, FOLATE, FERRITIN, TIBC, IRON, RETICCTPCT in the last 72 hours. Sepsis Labs: Recent Labs  Lab 11/08/2020 1503 11/10/2020 1739  LATICACIDVEN 1.2 1.2    Recent Results (from the past 240 hour(s))  Urine culture     Status: Abnormal   Collection Time: 10/20/2020  1:44 PM   Specimen: Urine, Catheterized  Result Value Ref Range Status   Specimen Description   Final    URINE, CATHETERIZED Performed at [facility], [address] [place], [place] [address]    Special Requests   Final    NONE Performed at [facility], [address] [place], [place] [address]    Culture >=100,000 COLONIES/mL PSEUDOMONAS AERUGINOSA (A)  Final   Report Status 10/26/2020 FINAL  Final   Organism ID, Bacteria PSEUDOMONAS AERUGINOSA (A)  Final      Susceptibility   Pseudomonas aeruginosa - MIC*    CEFTAZIDIME 4 SENSITIVE Sensitive     CIPROFLOXACIN <=0.25 SENSITIVE Sensitive     GENTAMICIN <=1 SENSITIVE Sensitive     IMIPENEM 2 SENSITIVE Sensitive     PIP/TAZO 8 SENSITIVE Sensitive     CEFEPIME 2 SENSITIVE Sensitive     * >=100,000 COLONIES/mL PSEUDOMONAS AERUGINOSA  Blood culture (routine single)     Status: None (Preliminary result)   Collection Time: 11/10/2020  3:03 PM   Specimen: Right Antecubital; Blood  Result Value Ref Range Status   Specimen Description   Final    RIGHT ANTECUBITAL BOTTLES DRAWN AEROBIC AND ANAEROBIC   Special Requests Blood Culture adequate volume  Final   Culture   Final    NO GROWTH 2 DAYS Performed at [facility], [address], [place], [place] [address]  Report Status PENDING  Incomplete  Resp Panel by RT-PCR (Flu A&B, Covid) Nasopharyngeal Swab     Status: None   Collection Time: 11/02/2020  3:56 PM   Specimen: Nasopharyngeal Swab; Nasopharyngeal(NP) swabs in vial transport medium  Result Value Ref Range Status   SARS Coronavirus 2 by RT PCR NEGATIVE  NEGATIVE Final    Comment: (NOTE) SARS-CoV-2 target nucleic acids are NOT DETECTED.  The SARS-CoV-2 RNA is generally detectable in upper respiratory specimens during the acute phase of infection. The lowest concentration of SARS-CoV-2 viral copies this assay can detect is 138 copies/mL. A negative result does not preclude SARS-Cov-2 infection and should not be used as the sole basis for treatment or other patient management decisions. A negative result may occur with  improper specimen collection/handling, submission of specimen other than nasopharyngeal swab, presence of viral mutation(s) within the areas targeted by this assay, and inadequate number of viral copies(<138 copies/mL). A negative result must be combined with clinical observations, patient history, and epidemiological information. The expected result is Negative.  Fact Sheet for Patients:  EntrepreneurPulse.com.au  Fact Sheet for Healthcare Providers:  IncredibleEmployment.be  This test is no t yet approved or cleared by the [place] FDA and  has been authorized for detection and/or diagnosis of SARS-CoV-2 by FDA under an Emergency Use Authorization (EUA). This EUA will remain  in effect (meaning this test can be used) for the duration of the COVID-19 declaration under Section 564(b)(1) of the Act, 21 U.S.C.section 360bbb-3(b)(1), unless the authorization is terminated  or revoked sooner.       Influenza A by PCR NEGATIVE NEGATIVE Final   Influenza B by PCR NEGATIVE NEGATIVE Final    Comment: (NOTE) The Xpert Xpress SARS-CoV-2/FLU/RSV plus assay is intended as an aid in the diagnosis of influenza from Nasopharyngeal swab specimens and should not be used as a sole basis for treatment. Nasal washings and aspirates are unacceptable for Xpert Xpress SARS-CoV-2/FLU/RSV testing.  Fact Sheet for Patients: EntrepreneurPulse.com.au  Fact Sheet for Healthcare  Providers: IncredibleEmployment.be  This test is not yet approved or cleared by the [place] FDA and has been authorized for detection and/or diagnosis of SARS-CoV-2 by FDA under an Emergency Use Authorization (EUA). This EUA will remain in effect (meaning this test can be used) for the duration of the COVID-19 declaration under Section 564(b)(1) of the Act, 21 U.S.C. section 360bbb-3(b)(1), unless the authorization is terminated or revoked.  Performed at [facility], [address] [place], [place] [address]   MRSA PCR Screening     Status: None   Collection Time: 11/12/2020 10:38 PM   Specimen: Nasal Mucosa; Nasopharyngeal  Result Value Ref Range Status   MRSA by PCR NEGATIVE NEGATIVE Final    Comment:        The GeneXpert MRSA Assay (FDA approved for NASAL specimens only), is one component of a comprehensive MRSA colonization surveillance program. It is not intended to diagnose MRSA infection nor to guide or monitor treatment for MRSA infections. Performed at [facility], [address] [place], [place] [address]          Radiology Studies: No results found.      Scheduled Meds: . [START ON 10/30/2020] aspirin  81 mg Oral Daily  . atorvastatin  80 mg Oral Daily  . calcitRIOL  0.5 mcg Oral Daily  . Chlorhexidine Gluconate Cloth  6 each Topical Daily  . feeding supplement (NEPRO CARB STEADY)  237 mL Oral BID BM  . insulin aspart  0-9 Units Subcutaneous TID WC  . insulin detemir  12 Units Subcutaneous QHS  . levothyroxine  175 mcg Oral Q0600  . metoprolol tartrate  100 mg Oral BID  . tamsulosin  0.4 mg Oral QPC supper   Continuous Infusions: . sodium chloride    . sodium chloride    . [START ON 10/28/2020] ceFEPime (MAXIPIME) IV    . methocarbamol (ROBAXIN) IV       LOS: 4 days    Time spent: 35 minutes    Antonino Nienhuis Darleen Crocker, DO [facility]  If 7PM-7AM, please contact night-coverage [email or web address] 10/27/2020, 11:26 AM

## 2020-10-27 NOTE — Progress Notes (Signed)
Patient ID: Michael Trevino, male   DOB: 1941/07/05, 79 y.o.   MRN: [id number] S: No complaints this morning.  Does not remember episode of hemoptysis yesterday. O:BP (!) 113/44 (BP Location: Right Arm)   Pulse 95   Temp 99.2 F (37.3 C) (Oral)   Resp 20   Ht 5\' 9"  (1.753 m)   Wt 94.7 kg   SpO2 92%   BMI 30.83 kg/m   Intake/Output Summary (Last 24 hours) at 10/27/2020 0909 Last data filed at 10/27/2020 0400 Gross per 24 hour  Intake 240 ml  Output 200 ml  Net 40 ml   Intake/Output: I/O last 3 completed shifts: In: 340 [P.O.:240; IV Piggyback:100] Out: 0947 [Urine:300; Other:2000]  Intake/Output this shift:  No intake/output data recorded. Weight change:  Gen: NAD CVS: RRR Resp: cta Abd: +BS, soft, NT/ND Ext: no edema, LUE AVF +T/B  Recent Labs  Lab 10/18/2020 1503 10/24/20 0507 10/25/20 0529 10/26/20 0558 10/27/20 0407  NA 132* 131*  131* 133* 133* 134*  K 5.2* 4.3  4.4 4.3 3.7 3.9  CL 92* 94*  94* 94* 94* 94*  CO2 22 19*  19* 20* 26 23  GLUCOSE 120* 111*  110* 74 93 89  BUN 79* 88*  86* 105* 58* 71*  CREATININE 12.11* 12.81*  12.82* 13.54* 8.42* 10.34*  ALBUMIN 2.7* 2.0* 2.0* 2.0* 2.1*  CALCIUM 7.0* 6.5*  6.5* 6.5* 6.8* 6.8*  PHOS  --  8.3* 9.1* 5.4* 6.3*  AST 43*  --   --   --   --   ALT 29  --   --   --   --    Liver Function Tests: Recent Labs  Lab 11/03/2020 1503 10/24/20 0507 10/25/20 0529 10/26/20 0558 10/27/20 0407  AST 43*  --   --   --   --   ALT 29  --   --   --   --   ALKPHOS 75  --   --   --   --   BILITOT 0.7  --   --   --   --   PROT 7.8  --   --   --   --   ALBUMIN 2.7*   < > 2.0* 2.0* 2.1*   < > = values in this interval not displayed.   No results for input(s): LIPASE, AMYLASE in the last 168 hours. No results for input(s): AMMONIA in the last 168 hours. CBC: Recent Labs  Lab 11/01/2020 1503 10/24/20 0507 10/25/20 0529 10/26/20 0558 10/27/20 0407  WBC 27.8* 21.7* 16.0* 10.0 8.4  NEUTROABS 23.6*  --  13.6* 7.8* 5.8  HGB  9.8* 8.0* 7.6* 8.1* 8.1*  HCT 31.2* 25.2* 24.2* 25.3* 24.9*  MCV 92.3 91.0 92.0 89.7 91.2  PLT 268 217 213 237 220   Cardiac Enzymes: No results for input(s): CKTOTAL, CKMB, CKMBINDEX, TROPONINI in the last 168 hours. CBG: Recent Labs  Lab 10/26/20 0740 10/26/20 1128 10/26/20 1647 10/26/20 2131 10/27/20 0314  GLUCAP 85 127* 96 120* 92    Iron Studies: No results for input(s): IRON, TIBC, TRANSFERRIN, FERRITIN in the last 72 hours. Studies/Results: No results found. Derrill Memo ON 10/30/2020] aspirin  81 mg Oral Daily  . atorvastatin  80 mg Oral Daily  . calcitRIOL  0.5 mcg Oral Daily  . Chlorhexidine Gluconate Cloth  6 each Topical Daily  . feeding supplement (NEPRO CARB STEADY)  237 mL Oral BID BM  . insulin aspart  0-9 Units Subcutaneous TID WC  .  insulin detemir  12 Units Subcutaneous QHS  . levothyroxine  175 mcg Oral Q0600  . metoprolol tartrate  100 mg Oral BID  . tamsulosin  0.4 mg Oral QPC supper    BMET    Component Value Date/Time   NA 134 (L) 10/27/2020 0407   NA 142 07/12/2020 1209   K 3.9 10/27/2020 0407   CL 94 (L) 10/27/2020 0407   CO2 23 10/27/2020 0407   GLUCOSE 89 10/27/2020 0407   BUN 71 (H) 10/27/2020 0407   BUN 54 (H) 07/12/2020 1209   CREATININE 10.34 (H) 10/27/2020 0407   CALCIUM 6.8 (L) 10/27/2020 0407   GFRNONAA 5 (L) 10/27/2020 0407   GFRAA 13 (L) 07/12/2020 1209   CBC    Component Value Date/Time   WBC 8.4 10/27/2020 0407   RBC 2.73 (L) 10/27/2020 0407   HGB 8.1 (L) 10/27/2020 0407   HGB 10.1 (L) 07/12/2020 1209   HCT 24.9 (L) 10/27/2020 0407   HCT 30.2 (L) 07/12/2020 1209   PLT 220 10/27/2020 0407   PLT 198 07/12/2020 1209   MCV 91.2 10/27/2020 0407   MCV 87 07/12/2020 1209   MCH 29.7 10/27/2020 0407   MCHC 32.5 10/27/2020 0407   RDW 14.0 10/27/2020 0407   RDW 15.1 07/12/2020 1209   LYMPHSABS 1.6 10/27/2020 0407   LYMPHSABS 2.7 07/12/2020 1209   MONOABS 0.8 10/27/2020 0407   EOSABS 0.0 10/27/2020 0407   EOSABS 0.1  07/12/2020 1209   BASOSABS 0.0 10/27/2020 0407   BASOSABS 0.0 07/12/2020 1209     Dialysis Orders: Center:[facility]. [id number] Bath 2K/2.5CaTime 4 hoursHeparin none. AccessRIJ TDC and maturing LUE AVFBFR 400DFR 600  Epogen1200Units IV/HD   Assessment/Plan: 1. UTI and urinary retention- currently on cefepime and responding.  Culture + for Pseudomonas 2. ESRD- Off schedule. Tolerated HD well Monday and will get back on schedule Thursday. 3. Hypertension/volume- stable 4. Anemia- s/p blood transfusion, will continue ESA 5. Metabolic bone disease- Continue with home meds 6. Nutrition- Renal diet, carb modified 7. DM type 2- per primary svc 8. Right Hip pain- no fracture seen on imaging. 9. CAD with previous NSTEMI- during his hospitalization from 11/18-12/1/21. LHC performed 11/22 demonstrated occluded RCA with grade 3 left to right collaterals, 50 % mid LAD. To follow up as outpatient with cardiology. 10. Disposition- came from SNF and will return when stable.  Donetta Potts, MD [employer] [phone]

## 2020-10-27 NOTE — Progress Notes (Signed)
Physical Therapy Treatment Patient Details Name: Michael Trevino MRN: [id number] DOB: 1941/10/24 Today's Date: 10/27/2020    History of Present Illness Michael Trevino  is a 79 y.o. male, male with past medical history relevant for HTN, DM2,nephrotic syndrome, ESRD, started hemodialysis last month, hypothyroidism, CAD with recent admission due to NSTEMI, and HLD and obesity .  -Patient with recent hospitalization at [facility], where hemodialysis was initiated, where he was discharged to SNF, patient was brought by the facility due to fever, and hip pain, altered mental status, initially patient was altered per ED physician, but this has improved upon my evaluation, son was at bedside helps with the history, patient with fever 101.2 per EMS, for which she received Tylenol, son report patient transfused 2 units PRBC on Thursday, so he missed dialysis that day, so he was dialyzed on Friday, he was supposed to be dialyzed today as well, as well patient with complaints of right hip pain for last 2 to 3 days,.  - in ED patient was noticed to be with urinary retention, 1200 cc of foul-smelling purulent urine drained after Foley catheter insertion, work-up significant for potassium of 5.2, creatinine of 12.1, white blood cell count of 27.8, but normal lactic acid, hemoglobin at 9.8, chest x-ray with no acute findings, UA with elevated leukocytes, but no pyuria, but hip x-ray with no evidence of fracture, [facility] hospitalist consulted to admit.    PT Comments    Pt agreeable to participate with PT treatment.  Pt limited by pain with any movement Rt hip and emotionally labile.  Bed mobility complete which was limited by pain, pt able to follow multinodal cues for handplacement to assist with rolling though required mod A to complete and unable to complete sidelying to sit due to pain.  Pt mentioned his wife through session though unable to complete thought/sentence related to his wife.  Max A required for  repositioning following bed mobility.  RN aware of status and pain.   Follow Up Recommendations  SNF     Equipment Recommendations  None recommended by PT    Recommendations for Other Services       Precautions / Restrictions Precautions Precautions: Fall    Mobility  Bed Mobility Overal bed mobility: Needs Assistance   Rolling: Min assist Sidelying to sit: Max assist       General bed mobility comments: unable to sit on EOB, reports of increased Rt hip pain with any movement, crying and screaming but would agree to complete movements before and after  Transfers                    Ambulation/Gait                 Stairs             Wheelchair Mobility    Modified Rankin (Stroke Patients Only)       Balance                                            Cognition Arousal/Alertness: Awake/alert Behavior During Therapy: WFL for tasks assessed/performed Overall Cognitive Status: No family/caregiver present to determine baseline cognitive functioning Area of Impairment: Orientation;Safety/judgement;Awareness;Problem solving                 Orientation Level: Disoriented to;Time;Situation       Safety/Judgement: Decreased awareness  of safety;Decreased awareness of deficits Awareness: Intellectual Problem Solving: Difficulty sequencing;Requires verbal cues;Decreased initiation General Comments: Pt oriented to place and self, reports year as 2003 and is unsure of situation      Exercises General Exercises - Lower Extremity Ankle Circles/Pumps: AROM;Both;10 reps;Supine Quad Sets: AROM;Both;10 reps;Supine Heel Slides: AAROM;10 reps;Supine    General Comments        Pertinent Vitals/Pain Faces Pain Scale: Hurts whole lot Pain Location: R leg Pain Descriptors / Indicators: Sore;Grimacing;Moaning Pain Intervention(s): Limited activity within patient's tolerance;Repositioned;Monitored during session    Home  Living                      Prior Function            PT Goals (current goals can now be found in the care plan section)      Frequency    Min 3X/week      PT Plan Current plan remains appropriate    Co-evaluation              AM-PAC PT "6 Clicks" Mobility   Outcome Measure  Help needed turning from your back to your side while in a flat bed without using bedrails?: A Lot Help needed moving from lying on your back to sitting on the side of a flat bed without using bedrails?: A Lot Help needed moving to and from a bed to a chair (including a wheelchair)?: Total Help needed standing up from a chair using your arms (e.g., wheelchair or bedside chair)?: Total Help needed to walk in hospital room?: Total Help needed climbing 3-5 steps with a railing? : Total 6 Click Score: 8    End of Session   Activity Tolerance: Patient limited by pain;Patient limited by fatigue Patient left: in bed;with SCD's reapplied;with call bell/phone within reach Nurse Communication: Mobility status PT Visit Diagnosis: Unsteadiness on feet (R26.81);Muscle weakness (generalized) (M62.81);Other abnormalities of gait and mobility (R26.89)     Time: 1340-1400 PT Time Calculation (min) (ACUTE ONLY): 20 min  Charges:  $Therapeutic Activity: 8-22 mins                     Michael Trevino, LPTA/CLT; CBIS [phone]  Michael Trevino 10/27/2020, 2:22 PM

## 2020-10-27 NOTE — Evaluation (Signed)
Occupational Therapy Evaluation Patient Details Name: Michael Trevino MRN: [id number] DOB: October 18, 1941 Today's Date: 10/27/2020    History of Present Illness Michael Trevino  is a 79 y.o. male, male with past medical history relevant for HTN, DM2,nephrotic syndrome, ESRD, started hemodialysis last month, hypothyroidism, CAD with recent admission due to NSTEMI, and HLD and obesity .  -Patient with recent hospitalization at [facility], where hemodialysis was initiated, where he was discharged to SNF, patient was brought by the facility due to fever, and hip pain, altered mental status, initially patient was altered per ED physician, but this has improved upon my evaluation, son was at bedside helps with the history, patient with fever 101.2 per EMS, for which she received Tylenol, son report patient transfused 2 units PRBC on Thursday, so he missed dialysis that day, so he was dialyzed on Friday, he was supposed to be dialyzed today as well, as well patient with complaints of right hip pain for last 2 to 3 days,.  - in ED patient was noticed to be with urinary retention, 1200 cc of foul-smelling purulent urine drained after Foley catheter insertion, work-up significant for potassium of 5.2, creatinine of 12.1, white blood cell count of 27.8, but normal lactic acid, hemoglobin at 9.8, chest x-ray with no acute findings, UA with elevated leukocytes, but no pyuria, but hip x-ray with no evidence of fracture, [facility] hospitalist consulted to admit.   Clinical Impression   Pt agreeable to OT evaluation, oriented to self and place. Pt emotionally labile this morning limiting successful completion of tasks. Session completed at bed level due to emotional lability and difficulty with follow through of simple instructions. Pt participating in simple UB ADLs, requiring max to total assistance for LB ADLs. Pt requiring frequent cuing to remain on task and to complete all steps of tasks. Recommend SNF on discharge to  improve safety and independence in ADL completion.     Follow Up Recommendations  SNF;Supervision/Assistance - 24 hour    Equipment Recommendations  None recommended by OT       Precautions / Restrictions Precautions Precautions: Fall Restrictions Weight Bearing Restrictions: No      Mobility Bed Mobility               General bed mobility comments: not completed    Transfers                 General transfer comment: Defer to PT        ADL either performed or assessed with clinical judgement   ADL Overall ADL's : Needs assistance/impaired Eating/Feeding: Set up;Bed level   Grooming: Wash/dry hands;Wash/dry face;Set up;Bed level Grooming Details (indicate cue type and reason): pt sitting up in bed for grooming, cuing for reaching both sides of face Upper Body Bathing: Moderate assistance;Maximal assistance;Sitting;Bed level   Lower Body Bathing: Maximal assistance;Total assistance;Bed level   Upper Body Dressing : Moderate assistance;Bed level;Sitting   Lower Body Dressing: Total assistance;Bed level                 General ADL Comments: Pt requiring assist for all ADLs and cuing for successful completion of all steps     Vision Baseline Vision/History: Wears glasses Patient Visual Report: No change from baseline Vision Assessment?: No apparent visual deficits            Pertinent Vitals/Pain Pain Assessment: No/denies pain     Hand Dominance Right   Extremity/Trunk Assessment Upper Extremity Assessment Upper Extremity Assessment: Generalized weakness  Lower Extremity Assessment Lower Extremity Assessment: Defer to PT evaluation   Cervical / Trunk Assessment Cervical / Trunk Assessment: Kyphotic   Communication Communication Communication: No difficulties   Cognition Arousal/Alertness: Awake/alert Behavior During Therapy: WFL for tasks assessed/performed Overall Cognitive Status: No family/caregiver present to determine  baseline cognitive functioning                                 General Comments: Pt oriented to place and self, reports year as 2003 and is unsure of situation              Home Living Family/patient expects to be discharged to:: Private residence Living Arrangements: Spouse/significant other Available Help at Discharge: Family;Available 24 hours/day Type of Home: Mobile home Home Access: Level entry     Home Layout: One level     Bathroom Shower/Tub: Teacher, early years/pre: Standard     Home Equipment: Cane - single point;Walker - 2 wheels          Prior Functioning/Environment Level of Independence: Needs assistance  Gait / Transfers Assistance Needed: assisted transfers at SNF, mostly non ambulatory ADL's / Homemaking Assistance Needed: Assistance with all ADLs at SNF            OT Problem List: Decreased strength;Impaired balance (sitting and/or standing);Decreased cognition;Decreased safety awareness;Decreased knowledge of use of DME or AE      OT Treatment/Interventions: Self-care/ADL training;DME and/or AE instruction;Therapeutic activities;Patient/family education;Balance training    OT Goals(Current goals can be found in the care plan section) Acute Rehab OT Goals Patient Stated Goal: go home OT Goal Formulation: With patient Time For Goal Achievement: 11/10/20 Potential to Achieve Goals: Fair  OT Frequency: Min 1X/week    End of Session    Activity Tolerance: Patient tolerated treatment well Patient left: in bed;with call bell/phone within reach;with bed alarm set  OT Visit Diagnosis: Muscle weakness (generalized) (M62.81);Other symptoms and signs involving cognitive function                Time: 3419-6222 OT Time Calculation (min): 14 min Charges:  OT General Charges $OT Visit: 1 Visit OT Evaluation $OT Eval Low Complexity: [place], OTR/L  [phone] 10/27/2020, 8:27 AM

## 2020-10-27 NOTE — Progress Notes (Signed)
Palliative-   Thank you for this consult. Chart reviewed. Plan to meet with family members tomorrow at [place], AGNP-C Palliative Medicine  Please call Palliative Medicine team phone with any questions [phone]. For individual providers please see AMION.  No charge

## 2020-10-28 ENCOUNTER — Inpatient Hospital Stay (HOSPITAL_COMMUNITY): Payer: Medicare Other

## 2020-10-28 ENCOUNTER — Other Ambulatory Visit: Payer: Self-pay | Admitting: Family Medicine

## 2020-10-28 DIAGNOSIS — Z992 Dependence on renal dialysis: Secondary | ICD-10-CM

## 2020-10-28 DIAGNOSIS — Z7189 Other specified counseling: Secondary | ICD-10-CM

## 2020-10-28 LAB — CBC WITH DIFFERENTIAL/PLATELET
Abs Immature Granulocytes: 0.11 10*3/uL — ABNORMAL HIGH (ref 0.00–0.07)
Basophils Absolute: 0 10*3/uL (ref 0.0–0.1)
Basophils Relative: 0 %
Eosinophils Absolute: 0.1 10*3/uL (ref 0.0–0.5)
Eosinophils Relative: 1 %
HCT: 27.7 % — ABNORMAL LOW (ref 39.0–52.0)
Hemoglobin: 8.7 g/dL — ABNORMAL LOW (ref 13.0–17.0)
Immature Granulocytes: 1 %
Lymphocytes Relative: 21 %
Lymphs Abs: 2.1 10*3/uL (ref 0.7–4.0)
MCH: 28.6 pg (ref 26.0–34.0)
MCHC: 31.4 g/dL (ref 30.0–36.0)
MCV: 91.1 fL (ref 80.0–100.0)
Monocytes Absolute: 1 10*3/uL (ref 0.1–1.0)
Monocytes Relative: 10 %
Neutro Abs: 6.4 10*3/uL (ref 1.7–7.7)
Neutrophils Relative %: 67 %
Platelets: 234 10*3/uL (ref 150–400)
RBC: 3.04 MIL/uL — ABNORMAL LOW (ref 4.22–5.81)
RDW: 13.7 % (ref 11.5–15.5)
WBC: 9.7 10*3/uL (ref 4.0–10.5)
nRBC: 0 % (ref 0.0–0.2)

## 2020-10-28 LAB — CBC
HCT: 25.8 % — ABNORMAL LOW (ref 39.0–52.0)
Hemoglobin: 8.2 g/dL — ABNORMAL LOW (ref 13.0–17.0)
MCH: 29.5 pg (ref 26.0–34.0)
MCHC: 31.8 g/dL (ref 30.0–36.0)
MCV: 92.8 fL (ref 80.0–100.0)
Platelets: 187 10*3/uL (ref 150–400)
RBC: 2.78 MIL/uL — ABNORMAL LOW (ref 4.22–5.81)
RDW: 14.1 % (ref 11.5–15.5)
WBC: 14.1 10*3/uL — ABNORMAL HIGH (ref 4.0–10.5)
nRBC: 0 % (ref 0.0–0.2)

## 2020-10-28 LAB — COMPREHENSIVE METABOLIC PANEL
ALT: 32 U/L (ref 0–44)
AST: 38 U/L (ref 15–41)
Albumin: 2.2 g/dL — ABNORMAL LOW (ref 3.5–5.0)
Alkaline Phosphatase: 52 U/L (ref 38–126)
Anion gap: 20 — ABNORMAL HIGH (ref 5–15)
BUN: 51 mg/dL — ABNORMAL HIGH (ref 8–23)
CO2: 18 mmol/L — ABNORMAL LOW (ref 22–32)
Calcium: 6.9 mg/dL — ABNORMAL LOW (ref 8.9–10.3)
Chloride: 95 mmol/L — ABNORMAL LOW (ref 98–111)
Creatinine, Ser: 7.57 mg/dL — ABNORMAL HIGH (ref 0.61–1.24)
GFR, Estimated: 7 mL/min — ABNORMAL LOW (ref >60)
Glucose, Bld: 176 mg/dL — ABNORMAL HIGH (ref 70–99)
Potassium: 4.5 mmol/L (ref 3.5–5.1)
Sodium: 133 mmol/L — ABNORMAL LOW (ref 135–145)
Total Bilirubin: 1 mg/dL (ref 0.3–1.2)
Total Protein: 6.6 g/dL (ref 6.5–8.1)

## 2020-10-28 LAB — CULTURE, BLOOD (SINGLE)
Culture: NO GROWTH
Special Requests: ADEQUATE

## 2020-10-28 LAB — BLOOD GAS, ARTERIAL
Acid-base deficit: 3.4 mmol/L — ABNORMAL HIGH (ref 0.0–2.0)
Bicarbonate: 21.1 mmol/L (ref 20.0–28.0)
Drawn by: 21310
FIO2: 50
MECHVT: 500 mL
O2 Saturation: 94.9 %
PEEP: 5 cmH2O
Patient temperature: 37.8
RATE: 16 resp/min
pCO2 arterial: 54 mmHg — ABNORMAL HIGH (ref 32.0–48.0)
pH, Arterial: 7.25 — ABNORMAL LOW (ref 7.350–7.450)
pO2, Arterial: 102 mmHg (ref 83.0–108.0)

## 2020-10-28 LAB — GLUCOSE, CAPILLARY
Glucose-Capillary: 99 mg/dL (ref 70–99)
Glucose-Capillary: 132 mg/dL — ABNORMAL HIGH (ref 70–99)
Glucose-Capillary: 112 mg/dL — ABNORMAL HIGH (ref 70–99)
Glucose-Capillary: 141 mg/dL — ABNORMAL HIGH (ref 70–99)
Glucose-Capillary: 187 mg/dL — ABNORMAL HIGH (ref 70–99)

## 2020-10-28 LAB — RENAL FUNCTION PANEL
Albumin: 2.1 g/dL — ABNORMAL LOW (ref 3.5–5.0)
Anion gap: 17 — ABNORMAL HIGH (ref 5–15)
BUN: 86 mg/dL — ABNORMAL HIGH (ref 8–23)
CO2: 22 mmol/L (ref 22–32)
Calcium: 6.9 mg/dL — ABNORMAL LOW (ref 8.9–10.3)
Chloride: 95 mmol/L — ABNORMAL LOW (ref 98–111)
Creatinine, Ser: 11.72 mg/dL — ABNORMAL HIGH (ref 0.61–1.24)
GFR, Estimated: 4 mL/min — ABNORMAL LOW (ref >60)
Glucose, Bld: 103 mg/dL — ABNORMAL HIGH (ref 70–99)
Phosphorus: 6.9 mg/dL — ABNORMAL HIGH (ref 2.5–4.6)
Potassium: 4.2 mmol/L (ref 3.5–5.1)
Sodium: 134 mmol/L — ABNORMAL LOW (ref 135–145)

## 2020-10-28 LAB — TSH: TSH: 7.523 u[IU]/mL — ABNORMAL HIGH (ref 0.350–4.500)

## 2020-10-28 LAB — RESP PANEL BY RT-PCR (FLU A&B, COVID) ARPGX2
Influenza A by PCR: NEGATIVE
Influenza B by PCR: NEGATIVE
SARS Coronavirus 2 by RT PCR: NEGATIVE

## 2020-10-28 LAB — LACTIC ACID, PLASMA: Lactic Acid, Venous: 2 mmol/L (ref 0.5–1.9)

## 2020-10-28 LAB — TRIGLYCERIDES: Triglycerides: 177 mg/dL — ABNORMAL HIGH (ref <150)

## 2020-10-28 LAB — MRSA PCR SCREENING: MRSA by PCR: NEGATIVE

## 2020-10-28 LAB — MAGNESIUM: Magnesium: 1.9 mg/dL (ref 1.7–2.4)

## 2020-10-28 LAB — AMMONIA: Ammonia: 25 umol/L (ref 9–35)

## 2020-10-28 MED ORDER — ORAL CARE MOUTH RINSE
15.0000 mL | OROMUCOSAL | Status: DC
  Administered 2020-10-28 – 2020-10-29 (×10): 15 mL via OROMUCOSAL

## 2020-10-28 MED ORDER — MAGNESIUM SULFATE 2 GM/50ML IV SOLN
2.0000 g | Freq: Once | INTRAVENOUS | Status: AC
  Administered 2020-10-28: 23:00:00 2 g via INTRAVENOUS
  Filled 2020-10-28: qty 50

## 2020-10-28 MED ORDER — SODIUM CHLORIDE 0.9 % IV BOLUS
250.0000 mL | Freq: Once | INTRAVENOUS | Status: AC
  Administered 2020-10-28: 19:00:00 250 mL via INTRAVENOUS

## 2020-10-28 MED ORDER — LEVOTHYROXINE SODIUM 75 MCG PO TABS
175.0000 ug | ORAL_TABLET | Freq: Every day | ORAL | Status: DC
  Administered 2020-10-29: 05:00:00 175 ug
  Filled 2020-10-28: qty 1

## 2020-10-28 MED ORDER — ETOMIDATE 2 MG/ML IV SOLN
0.3000 mg/kg | Freq: Once | INTRAVENOUS | Status: AC
  Administered 2020-10-28: 20:00:00 28.02 mg via INTRAVENOUS

## 2020-10-28 MED ORDER — PHENYLEPHRINE HCL-NACL 10-0.9 MG/250ML-% IV SOLN
0.0000 ug/min | INTRAVENOUS | Status: DC
  Administered 2020-10-28: 20:00:00 20 ug/min via INTRAVENOUS
  Administered 2020-10-29: 06:00:00 225 ug/min via INTRAVENOUS
  Administered 2020-10-29: 10:00:00 200 ug/min via INTRAVENOUS
  Administered 2020-10-29: 08:00:00 250 ug/min via INTRAVENOUS
  Administered 2020-10-29: 05:00:00 150 ug/min via INTRAVENOUS
  Administered 2020-10-29: 07:00:00 250 ug/min via INTRAVENOUS
  Administered 2020-10-29: 09:00:00 260 ug/min via INTRAVENOUS
  Administered 2020-10-29: 03:00:00 65 ug/min via INTRAVENOUS
  Filled 2020-10-28: qty 500
  Filled 2020-10-28 (×6): qty 250

## 2020-10-28 MED ORDER — DOCUSATE SODIUM 50 MG/5ML PO LIQD
100.0000 mg | Freq: Two times a day (BID) | ORAL | Status: DC
  Administered 2020-10-29: 08:00:00 100 mg
  Filled 2020-10-28: qty 10

## 2020-10-28 MED ORDER — HYDROCODONE-ACETAMINOPHEN 5-325 MG PO TABS: 1.0000 | ORAL_TABLET | Freq: Four times a day (QID) | ORAL | Status: DC | PRN

## 2020-10-28 MED ORDER — ACETAMINOPHEN 160 MG/5ML PO SOLN: 650.0000 mg | Freq: Four times a day (QID) | ORAL | Status: DC | PRN

## 2020-10-28 MED ORDER — AMIODARONE HCL IN DEXTROSE 360-4.14 MG/200ML-% IV SOLN
60.0000 mg/h | INTRAVENOUS | Status: AC
  Administered 2020-10-28 (×2): 60 mg/h via INTRAVENOUS
  Filled 2020-10-28: qty 200

## 2020-10-28 MED ORDER — METOPROLOL TARTRATE 50 MG PO TABS
100.0000 mg | ORAL_TABLET | Freq: Two times a day (BID) | ORAL | Status: DC
  Administered 2020-10-29: 08:00:00 100 mg
  Filled 2020-10-28: qty 2

## 2020-10-28 MED ORDER — ROCURONIUM BROMIDE 50 MG/5ML IV SOLN
1.0000 mg/kg | Freq: Once | INTRAVENOUS | Status: AC
  Administered 2020-10-28: 20:00:00 93.4 mg via INTRAVENOUS
  Filled 2020-10-28: qty 9.34

## 2020-10-28 MED ORDER — AMIODARONE IV BOLUS ONLY 150 MG/100ML: 150.0000 mg | Freq: Once | INTRAVENOUS | Status: AC

## 2020-10-28 MED ORDER — OXYCODONE-ACETAMINOPHEN 5-325 MG PO TABS
1.0000 | ORAL_TABLET | ORAL | 0 refills | Status: AC | PRN
Start: 2020-10-28 — End: ?

## 2020-10-28 MED ORDER — POLYETHYLENE GLYCOL 3350 17 G PO PACK
17.0000 g | PACK | Freq: Every day | ORAL | Status: DC
  Administered 2020-10-29: 08:00:00 17 g
  Filled 2020-10-28: qty 1

## 2020-10-28 MED ORDER — CIPROFLOXACIN HCL 500 MG PO TABS: 500.0000 mg | ORAL_TABLET | Freq: Two times a day (BID) | ORAL | 0 refills | Status: DC

## 2020-10-28 MED ORDER — ATORVASTATIN CALCIUM 40 MG PO TABS
80.0000 mg | ORAL_TABLET | Freq: Every day | ORAL | Status: DC
  Administered 2020-10-29: 08:00:00 80 mg
  Filled 2020-10-28: qty 2

## 2020-10-28 MED ORDER — AMIODARONE HCL IN DEXTROSE 360-4.14 MG/200ML-% IV SOLN
INTRAVENOUS | Status: AC
  Filled 2020-10-28: qty 200

## 2020-10-28 MED ORDER — ASPIRIN 81 MG PO CHEW: 81.0000 mg | CHEWABLE_TABLET | Freq: Every day | ORAL | Status: DC

## 2020-10-28 MED ORDER — PROPOFOL 1000 MG/100ML IV EMUL
5.0000 ug/kg/min | INTRAVENOUS | Status: DC
  Administered 2020-10-28: 20:00:00 5 ug/kg/min via INTRAVENOUS
  Administered 2020-10-29 (×2): 20 ug/kg/min via INTRAVENOUS
  Filled 2020-10-28 (×3): qty 100

## 2020-10-28 MED ORDER — CHLORHEXIDINE GLUCONATE 0.12% ORAL RINSE (MEDLINE KIT)
15.0000 mL | Freq: Two times a day (BID) | OROMUCOSAL | Status: DC
  Administered 2020-10-28 – 2020-10-29 (×2): 15 mL via OROMUCOSAL

## 2020-10-28 MED ORDER — TAMSULOSIN HCL 0.4 MG PO CAPS: 0.4000 mg | ORAL_CAPSULE | Freq: Every day | ORAL | 0 refills | Status: AC

## 2020-10-28 MED ORDER — AMIODARONE HCL IN DEXTROSE 360-4.14 MG/200ML-% IV SOLN
30.0000 mg/h | INTRAVENOUS | Status: DC
  Administered 2020-10-29: 08:00:00 30 mg/h via INTRAVENOUS
  Filled 2020-10-28: qty 200

## 2020-10-28 MED ORDER — NEPRO/CARBSTEADY PO LIQD: 237.0000 mL | Freq: Two times a day (BID) | ORAL | Status: DC

## 2020-10-28 MED ORDER — ACETAMINOPHEN 650 MG RE SUPP: 650.0000 mg | Freq: Four times a day (QID) | RECTAL | Status: DC | PRN

## 2020-10-28 MED ORDER — FENTANYL CITRATE (PF) 100 MCG/2ML IJ SOLN
25.0000 ug | INTRAMUSCULAR | Status: DC | PRN
  Administered 2020-10-29: 50 ug via INTRAVENOUS
  Filled 2020-10-28: qty 2

## 2020-10-28 NOTE — Consult Note (Signed)
Consultation Note Date: 10/28/2020   Patient Name: Michael Trevino  DOB: 01-19-1941  MRN: [id number]  Age / Sex: 79 y.o., male  PCP: Dettinger, Fransisca Kaufmann, MD Referring Physician: Rodena Goldmann, DO  Reason for Consultation: Establishing goals of care  HPI/Patient Profile: 79 y.o. male  with past medical history of diabetes, hypertension, CKD on HD, coronary artery disease status post MI, congestive heart failure, recent admission from November 18 to December 1 for congestive heart failure exacerbation and NSTEMI, he was started on dialysis during that admission and then discharged to nursing facility.  Admitted on 11/04/2020 with fever right hip pain and confusion, work-up revealed urinary retention with urinary tract infection, his mental status has waxed and waned during this admission, palliative medicine consulted for goals of care.  Clinical Assessment and Goals of Care: Chart reviewed and patient evaluated.  He was lying in bed with a very weak appearance.  Was unable to lift his arm on my command but was able to squeeze.  He was able to identify all of his children by name in the room.  He told me he felt well.  He was verbal but minimally, he was unable to participate in goals of care discussion. I met with 3 of the patient's children, Forrestine Him, Charma Igo, and Marlon Pel.  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  He is known for letting his family, and being a very faithful church going man, he appreciates his independence. Nutrition and functional status were discussed.  Currently he is eating about 50 to 75% of his meals per I&O flowsheet reports.  However family has reported a significant decline in his functional status especially since the beginning of his first hospitalization November 18.  At that  time patient was able to ambulate and was pretty independent with his ADLs however he has declined to the point of needing total assistance.  His mental status has also declined since his hospitalization and subsequent nursing facility stay, they note that he seems more tired, and gets confused more frequently.  His family noted that his mental status waxes and wanes, he will have very good moments of clarity and sharpness, however he also has very unclear and confused at times as well. After patient's first hospitalization their goal for patient going to SNF was to regain strength so that he could return to his home where he lives with his spouse.  We discussed his current illness state, chronic illnesses, and comorbidities, and possible illness trajectories.  We discussed how his heart failure contributes to his kidney failure.  We discussed the complications of dialysis and how dialysis can take a toll on the body.  We further discussed his recent UTI, and how this affects his mental status.  We discussed the concerns that patient will possibly continue on a downward trajectory requiring repeated hospitalizations.  We also discussed the possibility that patient may not be  able to rehab and get strong enough to return home living independently which was their expressed goal. Continued aggressive medical care, CODE STATUS, and options were discussed.  A MOST form was reviewed however family did not want to complete it and sign it at this time as they wish to discuss all of these choices with their mother, patient's spouse.  They expressed that their mother is able to make decisions however she has been in poor health and they did not want to delay this information on her today which is why she did not come to our meeting. The family asked very pertinent and important questions related to patient's prognosis and likelihood of returning to living in an independent state and were very receptive to open and honest  answers. At this time family chooses to continue full CODE STATUS and continue with aggressive medical care with plan to discharge to rehab.  Patient's daughter has submitted a Medicaid application in the event that patient requires long-term care.  The options of hospice were also discussed-both at home and residential hospice facility.  At this point hospice is not an option due to family would not want to discontinue patient's dialysis which would be a requirement for hospice admission. Family is open to follow-up from outpatient palliative in the community, and would like to complete a MOST form when patient gets to the nursing facility. Hard Choices book and paper copies of MOST form were give to all three children for review.   Primary Decision Maker NEXT OF KIN- patient's spouse and children    SUMMARY OF RECOMMENDATIONS -TOC referral for outpatient Palliative -Plan for d/c to rehab -Continue full code, full scope    Code Status/Advance Care Planning:  Full code  Additional Recommendations (Limitations, Scope, Preferences):  Full Scope Treatment  Prognosis:    Unable to determine  Discharge Planning: [place] for rehab with Palliative care service follow-up  Primary Diagnoses: Present on Admission: . CAD (coronary artery disease) . Essential hypertension, benign . Uncontrolled type 2 diabetes with stage 4 chronic kidney disease ([place]) . Encephalopathy acute   I have reviewed the medical record, interviewed the patient and family, and examined the patient. The following aspects are pertinent.  Past Medical History:  Diagnosis Date  . Arthritis   . Diabetes mellitus without complication ([place])   . Heart attack ([place])   . Hyperlipidemia   . Hypertension   . MI (myocardial infarction) ([place]) 2000  . Renal disorder   . Sepsis ([place])   . Sepsis due to Streptococcus, group B ([place]) 02/24/2013  . Stroke ([place])   . Thyroid disease    hypothyroid   Social  History   Socioeconomic History  . Marital status: Married    Spouse name: Not on file  . Number of children: 6  . Years of education: 38  . Highest education level: 9th grade  Occupational History  . 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 Smoking status: Former Smoker    Packs/day: 0.50    Years: 30.00    Pack years: 15.00    Types: Cigarettes    Quit date: 11/05/1984    Years since quitting: 36.0  . Smokeless tobacco: Never Used  Vaping Use  . Vaping Use: Never used  Substance and Sexual Activity  . Alcohol use: No  . Drug use: No  . Sexual activity: Yes  Other Topics Concern  . Not on file  Social History Narrative  . Not on file   Social Determinants of Health   Financial Resource Strain: Low Risk   . Difficulty of Paying Living Expenses: Not hard at all  Food Insecurity: No Food Insecurity  . Worried About Charity fundraiser in the Last Year: Never true  . Ran Out of Food in the Last Year: Never true  Transportation Needs: No Transportation Needs  . Lack of Transportation (Medical): No  . Lack of Transportation (Non-Medical): No  Physical Activity: Inactive  . Days of Exercise per Week: 0 days  . Minutes of Exercise per Session: 0 min  Stress: No Stress Concern Present  . Feeling of Stress : Not at all  Social Connections: Moderately Integrated  . Frequency of Communication with Friends and Family: Once a week  . Frequency of Social Gatherings with Friends and Family: More than three times a week  . Attends Religious Services: More than 4 times per year  . Active Member of Clubs or Organizations: No  . Attends Archivist Meetings: Never  . Marital Status: Married   Scheduled Meds: . [START ON 10/30/2020] aspirin  81 mg Oral Daily  . atorvastatin  80 mg Oral Daily  . calcitRIOL  0.5 mcg Oral Daily  . Chlorhexidine Gluconate Cloth  6 each Topical  Daily  . feeding supplement (NEPRO CARB STEADY)  237 mL Oral BID BM  . insulin aspart  0-9 Units Subcutaneous TID WC  . insulin detemir  12 Units Subcutaneous QHS  . levothyroxine  175 mcg Oral Q0600  . metoprolol tartrate  100 mg Oral BID  . tamsulosin  0.4 mg Oral QPC supper   Continuous Infusions: . sodium chloride    . sodium chloride    . ceFEPime (MAXIPIME) IV    . methocarbamol (ROBAXIN) IV     PRN Meds:.sodium chloride, sodium chloride, acetaminophen **OR** acetaminophen, alteplase, bisacodyl, heparin, HYDROcodone-acetaminophen, methocarbamol (ROBAXIN) IV Medications Prior to Admission:  Prior to Admission medications   Medication Sig Start Date End Date Taking? Authorizing Provider  acetaminophen (TYLENOL) 325 MG tablet Take 2 tablets (650 mg total) by mouth every 4 (four) hours as needed for headache or mild pain. 10/13/20  Yes Nolberto Hanlon, MD  amLODipine (NORVASC) 10 MG tablet TAKE 1 TABLET BY MOUTH EVERY DAY Patient taking differently: Take 10 mg by mouth daily. 06/14/20  Yes Dettinger, Fransisca Kaufmann, MD  ascorbic acid (VITAMIN C) 500 MG tablet Take 500 mg by mouth daily.   Yes [provider]  aspirin 81 MG chewable tablet Chew 1 tablet (81 mg total) by mouth daily. 10/14/20  Yes Nolberto Hanlon, MD  atorvastatin (LIPITOR) 80 MG tablet Take 1 tablet (80 mg total) by mouth daily. 10/14/20  Yes Nolberto Hanlon, MD  calcitRIOL (ROCALTROL) 0.5 MCG capsule Take 0.5 mcg by mouth daily. 04/09/20  Yes [provider]  Calcium Carbonate 500 MG CHEW Chew 1 tablet (500 mg total) by mouth 3 (three) times daily as needed. Patient taking differently: Chew 500 mg by mouth 3 (three) times daily as needed (indigestion). 05/01/16  Yes Cherre Robins, PharmD  cholecalciferol (VITAMIN D3) 25 MCG (1000 UNIT) tablet Take 1,000 Units by mouth daily.   Yes [provider]  hydrALAZINE (APRESOLINE) 50 MG tablet Take 1 tablet (50 mg total) by mouth 3 (three) times daily. 10/13/20  Yes Nolberto Hanlon, MD  insulin detemir (LEVEMIR FLEXPEN) 100 UNIT/ML FlexPen Inject 25 Units  into the skin at bedtime. 06/23/20  Yes Brita Romp, NP  metoprolol tartrate (LOPRESSOR) 100 MG tablet Take 1 tablet (100 mg total) by mouth 2 (two) times daily. 10/13/20  Yes Nolberto Hanlon, MD  multivitamin (RENA-VIT) TABS tablet Take 1 tablet by mouth at bedtime. 10/13/20  Yes Nolberto Hanlon, MD  oxyCODONE-acetaminophen (PERCOCET) 5-325 MG tablet Take 1 tablet by mouth every 4 (four) hours as needed for severe pain. 10/05/20 10/05/21 Yes Baglia, Corrina, PA-C  atorvastatin (LIPITOR) 40 MG tablet TAKE 1 TABLET BY MOUTH EVERY DAY  (Needs to be seen before next refill) 10/28/20   Dettinger, Fransisca Kaufmann, MD  bisacodyl (DULCOLAX) 10 MG suppository Place 1 suppository (10 mg total) rectally daily as needed for moderate constipation. Patient not taking: No sig reported 10/13/20   Nolberto Hanlon, MD  Darbepoetin Alfa (ARANESP) 40 MCG/0.4ML SOSY injection Inject 0.4 mLs (40 mcg total) into the vein every Thursday with hemodialysis. 10/14/20   Nolberto Hanlon, MD  glucose blood test strip Use to check blood glucose once daily.  Dx:  E11.9 07/14/15   Wardell Honour, MD  levothyroxine (SYNTHROID) 175 MCG tablet TAKE 1 TABLET BY MOUTH DAILY BEFORE BREAKFAST. 10/27/20   Brita Romp, NP  nitroGLYCERIN (NITROSTAT) 0.4 MG SL tablet Place 1 tablet (0.4 mg total) under the tongue every 5 (five) minutes as needed for chest pain. 10/13/20   Nolberto Hanlon, MD  Nutritional Supplements (FEEDING SUPPLEMENT, NEPRO CARB STEADY,) LIQD Take 237 mLs by mouth 2 (two) times daily between meals. 10/13/20   Nolberto Hanlon, MD   Allergies  Allergen Reactions  . Zocor [Simvastatin - High Dose] Nausea Only    Unknown   Review of Systems  Unable to perform ROS: Mental status change    Physical Exam Vitals and nursing note reviewed.  Constitutional:      Comments: Awake, alert  Cardiovascular:     Pulses: Normal pulses.  Pulmonary:     Effort:  Pulmonary effort is normal.  Neurological:     Motor: Weakness present.     Vital Signs: BP (!) 122/47   Pulse (!) 111   Temp 98.7 F (37.1 C) (Oral)   Resp 19   Ht '5\' 9"'  (1.753 m)   Wt 94.7 kg   SpO2 94%   BMI 30.83 kg/m  Pain Scale: 0-10   Pain Score: 0-No pain   SpO2: SpO2: 94 % O2 Device:SpO2: 94 % O2 Flow Rate: .   IO: Intake/output summary:   Intake/Output Summary (Last 24 hours) at 10/28/2020 1225 Last data filed at 10/28/2020 0800 Gross per 24 hour  Intake 480 ml  Output 400 ml  Net 80 ml    LBM: Last BM Date: 10/25/20 Baseline Weight: Weight: 94.3 kg Most recent weight: Weight: 94.7 kg     Palliative Assessment/Data: PPS: 30%     Thank you for this consult. Palliative medicine will continue to follow and assist as needed.   Time In: 1000 Time Out: 1126 Time Total: 86 mins Greater than 50%  of this time was spent counseling and coordinating care related to the above assessment and plan.  Signed by: Mariana Kaufman, AGNP-C Palliative Medicine    Please contact Palliative Medicine Team phone at [phone] for questions and concerns.  For individual provider: See Shea [facility]

## 2020-10-28 NOTE — Progress Notes (Signed)
   10/28/20 1752  Assess: MEWS Score  Temp (!) 101.2 F (38.4 C)  BP (!) 102/49  Pulse Rate (!) 152  Resp (!) 36  Level of Consciousness Responds to Voice  Assess: MEWS Score  MEWS Temp 1  MEWS Systolic 0  MEWS Pulse 3  MEWS RR 3  MEWS LOC 1  MEWS Score 8  MEWS Score Color Red  Assess: if the MEWS score is Yellow or Red  Were vital signs taken at a resting state? Yes  Focused Assessment Change from prior assessment (see assessment flowsheet)  Early Detection of Sepsis Score *See Row Information* Low  MEWS guidelines implemented *See Row Information* No, other (Comment) (He was moved to ICU)  Treat  Pain Scale PAINAD  Pain Score Asleep  Breathing 1  Negative Vocalization 0  Facial Expression 0  Body Language 0  Consolability 0  PAINAD Score 1  Notify: Provider  Provider Name/Title Dr. Manuella Ghazi  Date Provider Notified 10/28/20  Time Provider Notified 1800  Notification Type Page  Notification Reason Change in status  Response See new orders  Date of Provider Response 10/28/20  Time of Provider Response 1801  Notify: Rapid Response  Name of Rapid Response RN Notified C. Doy Mince, RN [facility] and SWOT nurse was on floor, in room with patient)

## 2020-10-28 NOTE — Progress Notes (Signed)
Patient daughter present in room.  Patient foley is lighter in color since irrigation on previous shift.  Patient urine appears dark yellow, amber in color.  Daughter states patient has trouble with urine.

## 2020-10-28 NOTE — Progress Notes (Signed)
[place] Progress Note Patient Name: Michael Trevino DOB: [birth year] MRN: [id number]   Date of Service  10/28/2020  HPI/Events of Note  Afib with RVR persistent despite 150 mg iv Amiodarone bolus.  eICU Interventions  Amiodarone infusion ordered.        [name] Tiffanie Blassingame 10/28/2020, 8:26 PM

## 2020-10-28 NOTE — Progress Notes (Signed)
[place] Progress Note Patient Name: Michael Trevino DOB: 1941-01-11 MRN: [id number]   Date of Service  10/28/2020  HPI/Events of Note  CT head unremarkable for acute findings, Mg+ 1.9, TSH 7.25.  eICU Interventions  EEG ordered r/o CVA, Free T4 ordered, Mg+ So4 2 gm iv x 1, order to advance ET tube 2 cm entered.        [name] [name] 10/28/2020, 10:09 PM

## 2020-10-28 NOTE — Progress Notes (Signed)
Patient transported to CT and back to room with RN, RT, and radiology transporter. Patient tolerated procedure well with no noted complications.

## 2020-10-28 NOTE — Progress Notes (Signed)
Patient ID: Michael Trevino, male   DOB: 11-Jul-1941, 79 y.o.   MRN: [id number] S: No complaints this morning.  No acute events O:BP 137/60   Pulse 94   Temp 98 F (36.7 C)   Resp 19   Ht 5\' 9"  (1.753 m)   Wt 94.7 kg   SpO2 95%   BMI 30.83 kg/m   Intake/Output Summary (Last 24 hours) at 10/28/2020 0904 Last data filed at 10/28/2020 0500 Gross per 24 hour  Intake 360 ml  Output 400 ml  Net -40 ml   Intake/Output: I/O last 3 completed shifts: In: 360 [P.O.:360] Out: 600 [Urine:600]  Intake/Output this shift:  No intake/output data recorded. Weight change:  Gen: NAD CVS: RRR Resp: cta Abd: +BS, soft, NT/ND Ext: no edema, LUE AVF +T/B Access: [employer] c/d/i  Recent Labs  Lab 10/22/2020 1503 10/24/20 0507 10/25/20 0529 10/26/20 0558 10/27/20 0407 10/28/20 0532  NA 132* 131*  131* 133* 133* 134* 134*  K 5.2* 4.3  4.4 4.3 3.7 3.9 4.2  CL 92* 94*  94* 94* 94* 94* 95*  CO2 22 19*  19* 20* 26 23 22   GLUCOSE 120* 111*  110* 74 93 89 103*  BUN 79* 88*  86* 105* 58* 71* 86*  CREATININE 12.11* 12.81*  12.82* 13.54* 8.42* 10.34* 11.72*  ALBUMIN 2.7* 2.0* 2.0* 2.0* 2.1* 2.1*  CALCIUM 7.0* 6.5*  6.5* 6.5* 6.8* 6.8* 6.9*  PHOS  --  8.3* 9.1* 5.4* 6.3* 6.9*  AST 43*  --   --   --   --   --   ALT 29  --   --   --   --   --    Liver Function Tests: Recent Labs  Lab 11/03/2020 1503 10/24/20 0507 10/26/20 0558 10/27/20 0407 10/28/20 0532  AST 43*  --   --   --   --   ALT 29  --   --   --   --   ALKPHOS 75  --   --   --   --   BILITOT 0.7  --   --   --   --   PROT 7.8  --   --   --   --   ALBUMIN 2.7*   < > 2.0* 2.1* 2.1*   < > = values in this interval not displayed.   No results for input(s): LIPASE, AMYLASE in the last 168 hours. No results for input(s): AMMONIA in the last 168 hours. CBC: Recent Labs  Lab 10/24/20 0507 10/25/20 0529 10/26/20 0558 10/27/20 0407 10/28/20 0532  WBC 21.7* 16.0* 10.0 8.4 9.7  NEUTROABS  --  13.6* 7.8* 5.8 6.4  HGB 8.0* 7.6* 8.1*  8.1* 8.7*  HCT 25.2* 24.2* 25.3* 24.9* 27.7*  MCV 91.0 92.0 89.7 91.2 91.1  PLT 217 213 237 220 234   Cardiac Enzymes: No results for input(s): CKTOTAL, CKMB, CKMBINDEX, TROPONINI in the last 168 hours. CBG: Recent Labs  Lab 10/27/20 0751 10/27/20 1108 10/27/20 1650 10/27/20 2141 10/28/20 0745  GLUCAP 108* 124* 107* 167* 99    Iron Studies: No results for input(s): IRON, TIBC, TRANSFERRIN, FERRITIN in the last 72 hours. Studies/Results: No results found. Derrill Memo ON 10/30/2020] aspirin  81 mg Oral Daily  . atorvastatin  80 mg Oral Daily  . calcitRIOL  0.5 mcg Oral Daily  . Chlorhexidine Gluconate Cloth  6 each Topical Daily  . feeding supplement (NEPRO CARB STEADY)  237 mL Oral BID BM  .  insulin aspart  0-9 Units Subcutaneous TID WC  . insulin detemir  12 Units Subcutaneous QHS  . levothyroxine  175 mcg Oral Q0600  . metoprolol tartrate  100 mg Oral BID  . tamsulosin  0.4 mg Oral QPC supper    BMET    Component Value Date/Time   NA 134 (L) 10/28/2020 0532   NA 142 07/12/2020 1209   K 4.2 10/28/2020 0532   CL 95 (L) 10/28/2020 0532   CO2 22 10/28/2020 0532   GLUCOSE 103 (H) 10/28/2020 0532   BUN 86 (H) 10/28/2020 0532   BUN 54 (H) 07/12/2020 1209   CREATININE 11.72 (H) 10/28/2020 0532   CALCIUM 6.9 (L) 10/28/2020 0532   GFRNONAA 4 (L) 10/28/2020 0532   GFRAA 13 (L) 07/12/2020 1209   CBC    Component Value Date/Time   WBC 9.7 10/28/2020 0532   RBC 3.04 (L) 10/28/2020 0532   HGB 8.7 (L) 10/28/2020 0532   HGB 10.1 (L) 07/12/2020 1209   HCT 27.7 (L) 10/28/2020 0532   HCT 30.2 (L) 07/12/2020 1209   PLT 234 10/28/2020 0532   PLT 198 07/12/2020 1209   MCV 91.1 10/28/2020 0532   MCV 87 07/12/2020 1209   MCH 28.6 10/28/2020 0532   MCHC 31.4 10/28/2020 0532   RDW 13.7 10/28/2020 0532   RDW 15.1 07/12/2020 1209   LYMPHSABS 2.1 10/28/2020 0532   LYMPHSABS 2.7 07/12/2020 1209   MONOABS 1.0 10/28/2020 0532   EOSABS 0.1 10/28/2020 0532   EOSABS 0.1 07/12/2020  1209   BASOSABS 0.0 10/28/2020 0532   BASOSABS 0.0 07/12/2020 1209     Dialysis Orders: Center:[facility]. TMA26.3FHLK Bath 2K/2.5CaTime 4 hoursHeparin none. AccessRIJ TDC and maturing LUE AVFBFR 400DFR 600  Epogen1200Units IV/HD   Assessment/Plan: 1. UTI and urinary retention- currently on cefepime and responding.  Culture + for Pseudomonas 2. Acute metabolic encephalopathy versus progression of dementia: Palliative care/family meeting today 3. ESRD- HD today, back on schedule TTS 4. Hypertension/volume- stable 5. Anemia- s/p blood transfusion, will continue ESA 6. Metabolic bone disease- Continue with home meds 7. Nutrition- Renal diet, carb modified 8. DM type 2- per primary svc 9. Right Hip pain- no fracture seen on imaging. 10. CAD with previous NSTEMI- during his hospitalization from 11/18-12/1/21. LHC performed 11/22 demonstrated occluded RCA with grade 3 left to right collaterals, 50 % mid LAD. To follow up as outpatient with cardiology. 11. Disposition- came from SNF and will return when stable versus home hospice depending on family meeting today  Gean Quint, MD [facility]

## 2020-10-28 NOTE — Progress Notes (Signed)
Patient would not respond to tech or myself. Notified charge Therapist, sports and [employer]. Took vitals, blood sugar and hooked up to telemetry. Discussed patient status with Dr. Manuella Ghazi, Swot RN performed EKG, patient was no longer stable enough to stay on medsurg unit so transferred to stepdown. Dr. Ree Kida patient's family.

## 2020-10-28 NOTE — Progress Notes (Signed)
[place] Progress Note Patient Name: Michael Trevino DOB: [birth year] MRN: [id number]   Date of Service  10/28/2020  HPI/Events of Note  Patient with ESRD-DD, he was alert and interactive earlier this evening, after dialysis he went into Afib with RVR and became unresponsive, questionable airway protection.  eICU Interventions  Stat intubation (ED doctor contacted), Stat head CT stroke protocol, ABG, CMP, CBC, Ammonia level.        [name] Hidaya Daniel 10/28/2020, 8:08 PM

## 2020-10-28 NOTE — Discharge Summary (Signed)
Physician Discharge Summary  Rutledge Selsor TDS:[id number] DOB: [birth year] DOA: 10/18/2020  PCP: Dettinger, Fransisca Kaufmann, MD  Admit date: 11/05/2020  Discharge date: 10/28/2020  Admitted From:SNF  Disposition:  SNF  Recommendations for Outpatient Follow-up:  1. Follow up with PCP in 1-2 weeks 2. Follow-up with urology requested for assessment of urinary retention with Foley catheter on discharge 3. Treatment completed for Pseudomonas UTI with cefepime over the course of 5 days 4. Continue on prior home medications as noted 5. Follow-up with palliative care in the outpatient setting, noted need for possible hospice and  Home Health: None  Equipment/Devices: None  Discharge Condition: Stable  CODE STATUS: Full  Diet recommendation: Renal/carb modified  Brief/Interim Summary: 79 y.o.male,malewith past medical history relevant for HTN, DM2,nephrotic syndrome,ESRD, started hemodialysis last month, hypothyroidism, CADwith recent admission due toNSTEMI, and HLD and obesity. -Patient with recent hospitalization at [facility], where hemodialysis was initiated, where he was discharged to SNF, patient was brought by the facility due to fever, and hip pain, altered mental status, initially patient was altered per ED physician, but this has improved upon my evaluation, son was at bedside helps with the history, patient with fever 101.2 per EMS, for which she received Tylenol, son report patient transfused 2 units PRBC on Thursday, so he missed dialysis that day, so he was dialyzed on Friday, he was supposed to be dialyzed today as well,as well patient with complaints of right hip pain for last 2 to 3 days. - in EDpatient was noticed to be with urinary retention, 1200 cc of foul-smelling purulent urine drained after Foley catheter insertion, work-up significant for potassium of 5.2, creatinine of 12.1, white blood cell count of 27.8, but normal lactic acid, hemoglobin at 9.8, chest x-ray  with no acute findings, UA with elevated leukocytes, but no pyuria, but hip x-ray with no evidence of fracture, [facility] hospitalist consulted to admit.  -Patient was admitted for acute metabolic encephalopathy superimposed on progressive dementia with delirium in the setting of Pseudomonas UTI with acute urinary retention.  He has been treated with cefepime appropriately over the course of 5 days and continues to remain with Foley catheter in place due to acute urinary retention and will need follow-up with urology in the outpatient setting.  He has received hemodialysis while admitted per his usual schedule TTS with nephrology.  He was noted to have some hip pain with no fracture seen on imaging during this admission.  PT continues to recommend SNF.  He is also been seen by palliative care due to concerns for progressive decline and poor oral intake.  Family members elect to follow-up in outpatient setting regarding need for hospice and will see palliative care.  No other acute events noted throughout the course of this admission.  He is otherwise stable for discharge.  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  Discharge Instructions  Discharge Instructions    Ambulatory referral to Urology   Complete by: As directed    Hospital follow up for uti/urinary retention with foley catheter.   Diet - low sodium heart healthy   Complete by: As directed  Discharge wound care:   Complete by: As directed    Daily dressing changes.   Increase activity slowly   Complete by: As directed       Allergies as of 10/28/2020      Reactions   Zocor [simvastatin - High Dose] Nausea Only   Unknown      Medication List    TAKE these medications   acetaminophen 325 MG tablet Commonly known as: TYLENOL Take 2 tablets (650 mg total) by mouth every 4 (four) hours as needed for headache or mild pain.   amLODipine 10 MG tablet Commonly known as: NORVASC TAKE 1 TABLET BY MOUTH EVERY DAY   ascorbic acid 500 MG tablet Commonly known as: VITAMIN C Take 500 mg by mouth daily.   aspirin 81 MG chewable tablet Chew 1 tablet (81 mg total) by mouth daily.   atorvastatin 80 MG tablet Commonly known as: LIPITOR Take 1 tablet (80 mg total) by mouth daily. What changed: Another medication with the same name was added. Make sure you understand how and when to take each.   atorvastatin 40 MG tablet Commonly known as: LIPITOR TAKE 1 TABLET BY MOUTH EVERY DAY  (Needs to be seen before next refill) What changed: You were already taking a medication with the same name, and this prescription was added. Make sure you understand how and when to take each.   bisacodyl 10 MG suppository Commonly known as: DULCOLAX Place 1 suppository (10 mg total) rectally daily as needed for moderate constipation.   calcitRIOL 0.5 MCG capsule Commonly known as: ROCALTROL Take 0.5 mcg by mouth daily.   Calcium Carbonate 500 MG Chew Chew 1 tablet (500 mg total) by mouth 3 (three) times daily as needed. What changed: reasons to take this   cholecalciferol 25 MCG (1000 UNIT) tablet Commonly known as: VITAMIN D3 Take 1,000 Units by mouth daily.   Darbepoetin Alfa 40 MCG/0.4ML Sosy injection Commonly known as: ARANESP Inject 0.4 mLs (40 mcg total) into the vein every Thursday with hemodialysis.   feeding supplement (NEPRO CARB STEADY) Liqd Take 237 mLs by mouth 2 (two) times daily between meals.   glucose blood test strip Use to check blood glucose once daily.  Dx:  E11.9   hydrALAZINE 50 MG  tablet Commonly known as: APRESOLINE Take 1 tablet (50 mg total) by mouth 3 (three) times daily.   insulin detemir 100 UNIT/ML FlexPen Commonly known as: Levemir FlexPen Inject 25 Units into the skin at bedtime.   levothyroxine 175 MCG tablet Commonly known as: SYNTHROID TAKE 1 TABLET BY MOUTH DAILY BEFORE BREAKFAST. What changed: See the new instructions.   metoprolol tartrate 100 MG tablet Commonly known as: LOPRESSOR Take 1 tablet (100 mg total) by mouth 2 (two) times daily.   multivitamin Tabs tablet Take 1 tablet by mouth at bedtime.   nitroGLYCERIN 0.4 MG SL tablet Commonly known as: NITROSTAT Place 1 tablet (0.4 mg total) under the tongue every 5 (five) minutes as needed for chest pain.   oxyCODONE-acetaminophen 5-325 MG tablet Commonly known as: Percocet Take 1 tablet by mouth every 4 (four) hours as needed for severe pain.   tamsulosin 0.4 MG Caps capsule Commonly known as: FLOMAX Take 1 capsule (0.4 mg total) by mouth daily after supper.            Discharge Care Instructions  (From admission, onward)         Start     Ordered   10/28/20 0000  Discharge wound  care:       Comments: Daily dressing changes.   10/28/20 1334          Contact information for follow-up providers    [facility].   Specialty: Hospice Why:  Referral Made to Palliative services, They will see patient next week at [facility]. Contact information: [address] [place] [phone]       Dettinger, Fransisca Kaufmann, MD Follow up in 1 week(s).   Specialties: Family Medicine, Cardiology Contact information: [place] [address] [phone]            Contact information for after-discharge care    Destination    [facility]   Service: Skilled Nursing Contact information: [address] [place] [phone]                  Allergies  Allergen Reactions  . Zocor [Simvastatin - High Dose] Nausea Only    Unknown    Consultations:  Nephrology  Palliative care   Procedures/Studies: CT ABDOMEN PELVIS WO CONTRAST  Result Date: 11/04/2020 CLINICAL DATA:  Sepsis, unknown source, UTI and urinary retention. Fever and right hip pain. EXAM: CT ABDOMEN AND PELVIS WITHOUT CONTRAST TECHNIQUE: Multidetector CT imaging of the abdomen and pelvis was performed following the standard protocol without IV contrast. COMPARISON:  None. FINDINGS: Markedly limited evaluation due to respiratory motion artifact. Lower chest: At least small to moderate volume left pleural effusion. Visualized portions of the left lower lobes are collapsed. The left ventricle is prominent in size. Hepatobiliary: There is a hypodense 4.7 cm lesion within the left hepatic lobe. The remainder of the a patent parenchyma is grossly unremarkable on this noncontrast study. No CT evidence of calcified gallstones. The gallbladder is grossly unremarkable on this noncontrast study. No biliary ductal dilatation. Pancreas: No focal lesion. Normal pancreatic contour. No surrounding inflammatory changes. No main pancreatic ductal dilatation. Spleen: Normal in size without focal abnormality. Adrenals/Urinary Tract: No adrenal nodule bilaterally. Bilateral renal cortical scarring. At least mild bilateral, right greater than left, hydroureteronephrosis. No nephrolithiasis and no contour-deforming renal mass. No ureterolithiasis. The urinary bladder is decompressed with a Foley catheter terminating within its lumen. Urinary bladder walls appear thickened. Perivesicular fat stranding. Stomach/Bowel: Stomach is within normal limits. No evidence of bowel wall thickening or dilatation. Diffuse descending colon and sigmoid diverticulosis. The appendix is normal in caliber. Vascular/Lymphatic: No abdominal aorta or iliac aneurysm. Moderate to severe atherosclerotic plaque  of the aorta and its branches. No abdominal, pelvic, or inguinal lymphadenopathy. Reproductive: The prostate is enlarged in size measuring up to 5.2 cm. Other: Diffuse mild haziness of the mesentery likely due to motion. No intraperitoneal free fluid. No intraperitoneal free gas. No organized fluid collection. Musculoskeletal: Mild subcutaneus soft tissue edema. Slightly asymmetrical and hypodensity of the left flank abdominal wall musculature with overlying 6.9 x 2.3 cm slightly higher than simple fluid density suggestive of possible hematoma formation. Soft tissue density within the anterior subcutaneus soft tissues likely related to medication injections. Possible tiny right fat  inguinal hernia. No suspicious lytic or blastic osseous lesions. No cortical erosion or destruction. No acute displaced fracture. Multilevel degenerative changes of the spine. At least mild bilateral hip degenerative changes. IMPRESSION: 1. Limited evaluation due to respiratory motion artifact and noncontrast study. 2. Collecting system and urinary bladder findings consistent with known infection and urinary retention in the setting of  prostatomegaly. 3. Slightly asymmetric and hypodense left flank abdominal wall musculature with overlying 6.9 x 2.3 cm slightly higher than simple fluid density suggestive of possible hematoma formation. No organized fluid collection, though limited evaluation on this noncontrast study. Superimposed infection is not excluded. 4. Mild to moderate size left pleural effusion that is incompletely visualized. 5. Indeterminate 4.7 cm left hepatic lobe lesion. 6. Other imaging findings of potential clinical significance: Prominent, likely enlarged, left ventricle. Possible tiny right fat inguinal hernia. Aortic Atherosclerosis (ICD10-I70.0). Electronically Signed   By: Iven Finn M.D.   On: 11/03/2020 17:40   CARDIAC CATHETERIZATION  Result Date: 10/04/2020  Ost RCA to Prox RCA lesion is 100% stenosed.   Mid LAD lesion is 50% stenosed.  Shondale Quinley is a 79 y.o. male  [id number] LOCATION:  FACILITY: [facility] PHYSICIAN: Quay Burow, M.D. [birth year] DATE OF PROCEDURE:  10/04/2020 DATE OF DISCHARGE: CARDIAC CATHETERIZATION History obtained from chart review.79 y.o. male with known CAD admitted with NSTEMI. EF remains normal on TTE this admission.  He does have moderate AI.  He has a temporary dialysis catheter in place and will ultimately need an AV fistula placed for long-term hemodialysis.  He was referred for diagnostic coronary angiography because of chest pain shortness of breath and positive enzymes.   Mr. Gieske  has an occluded RCA (CTO) with grade 3 left-to-right collaterals.  He has a 50% mid LAD lesion.  His LVEDP was only mildly elevated at 19.  He was admitted with chest pain and shortness of breath.  His BNP was elevated and his enzymes peaked at about 3000.  I believe this was demand ischemia from volume overload and collateral insufficiency.  Medical therapy will be recommended.  He will need an AV fistula placed once he is hemodynamically stable.  A right common femoral angiogram was performed and a Mynx closure device was successfully deployed achieving hemostasis.  The patient left lab in stable condition. Quay Burow. MD, [facility] 10/04/2020 8:38 AM   IR Fluoro Guide CV Line Right  Result Date: 10/01/2020 INDICATION: END-STAGE RENAL DISEASE, NO CURRENT ACCESS FOR DIALYSIS EXAM: ULTRASOUND FLUOROSCOPIC RIGHT IJ TEMPORARY DIALYSIS CATHETER [facility] CATHETER) MEDICATIONS: 1% lidocaine local ANESTHESIA/SEDATION: None. FLUOROSCOPY TIME:  Fluoroscopy Time: 0 minutes 36 seconds (29.8 mGy). COMPLICATIONS: None immediate. PROCEDURE: Informed written consent was obtained from the patient after a thorough discussion of the procedural risks, benefits and alternatives. All questions were addressed. Maximal Sterile Barrier Technique was utilized including caps, mask, sterile gowns, sterile gloves, sterile  drape, hand hygiene and skin antiseptic. A timeout was performed prior to the initiation of the procedure. Under sterile conditions and local anesthesia, ultrasound micropuncture access performed of the right internal jugular vein. Images obtained for documentation of the patent right internal jugular vein. Micro dilator advanced. Amplatz guidewire inserted. Tract dilatation performed to insert a 20 cm her catheter. Tip position SVC RA junction. Images obtained for documentation. Access secured with prolene sutures. Blood aspirated easily followed by saline and heparin flushes. Appropriate volume and strength of heparin instilled in all 3 lumens followed by external caps and a sterile dressing. No immediate complication. Patient tolerated the procedure well. IMPRESSION: Successful ultrasound and fluoroscopic right IJ temporary dialysis catheter (Mahurkar catheter). Access ready for use. Electronically Signed   By: Jerilynn Mages.  Shick M.D.   On: 10/01/2020 16:04   IR US Guide Vasc Access Right  Result Date: 10/01/2020 INDICATION: END-STAGE RENAL DISEASE, NO CURRENT ACCESS FOR DIALYSIS EXAM: ULTRASOUND FLUOROSCOPIC RIGHT IJ TEMPORARY DIALYSIS CATHETER ([place])  MEDICATIONS: 1% lidocaine local ANESTHESIA/SEDATION: None. FLUOROSCOPY TIME:  Fluoroscopy Time: 0 minutes 36 seconds (29.8 mGy). COMPLICATIONS: None immediate. PROCEDURE: Informed written consent was obtained from the patient after a thorough discussion of the procedural risks, benefits and alternatives. All questions were addressed. Maximal Sterile Barrier Technique was utilized including caps, mask, sterile gowns, sterile gloves, sterile drape, hand hygiene and skin antiseptic. A timeout was performed prior to the initiation of the procedure. Under sterile conditions and local anesthesia, ultrasound micropuncture access performed of the right internal jugular vein. Images obtained for documentation of the patent right internal jugular vein. Micro dilator  advanced. Amplatz guidewire inserted. Tract dilatation performed to insert a 20 cm her catheter. Tip position SVC RA junction. Images obtained for documentation. Access secured with prolene sutures. Blood aspirated easily followed by saline and heparin flushes. Appropriate volume and strength of heparin instilled in all 3 lumens followed by external caps and a sterile dressing. No immediate complication. Patient tolerated the procedure well. IMPRESSION: Successful ultrasound and fluoroscopic right IJ temporary dialysis catheter (Mahurkar catheter). Access ready for use. Electronically Signed   By: Jerilynn Mages.  Shick M.D.   On: 10/01/2020 16:04   DG Chest Port 1 View  Result Date: 11/04/2020 CLINICAL DATA:  79 year old male with questionable sepsis. EXAM: PORTABLE CHEST 1 VIEW COMPARISON:  10/05/2020 FINDINGS: Unchanged cardiomegaly. Similar appearance of indwelling right IJ tunneled hemodialysis catheter with the catheter tip in the superior vena cava. Interval improved aeration of the lungs bilaterally. Probable trace left pleural effusion. No new focal consolidations or pneumothorax. No acute osseous abnormality. IMPRESSION: No airspace opacities that would be concerning for pneumonia. Interval improved fluid status with decreased pulmonary edema and decreased size of probable trace left pleural effusion. Unchanged cardiomegaly. Electronically Signed   By: Ruthann Cancer MD   On: 11/01/2020 14:31   DG Chest Port 1 View  Result Date: 10/05/2020 CLINICAL DATA:  Dialysis catheter insertion EXAM: PORTABLE CHEST 1 VIEW COMPARISON:  09/30/2020 FINDINGS: Right-sided central venous catheter tip over the SVC. No pneumothorax. Cardiomegaly with vascular congestion and hazy pulmonary edema. At least moderate likely layering left pleural effusion with small right pleural effusion. Dense consolidation left lung base. Aortic atherosclerosis. IMPRESSION: 1. Right-sided central venous catheter tip over the SVC. No pneumothorax.  2. Cardiomegaly with vascular congestion and hazy pulmonary edema. Bilateral pleural effusions, at least moderate on the left and probably increased. Small right pleural effusion. Worsening aeration of left base. Electronically Signed   By: Donavan Foil M.D.   On: 10/05/2020 17:00   DG Chest Portable 1 View  Result Date: 09/30/2020 CLINICAL DATA:  Status post thoracentesis. EXAM: PORTABLE CHEST 1 VIEW COMPARISON:  Earlier today at 6:49 a.m. FINDINGS: 11:19 a.m. Midline trachea. Mild cardiomegaly. Atherosclerosis in the transverse aorta. Decrease in small left pleural effusion. Probable tiny layering right pleural effusion. No pneumothorax. Mild congestive heart failure, improved. Persistent right and improved left base airspace disease. IMPRESSION: Decreased sided pleural effusion, without pneumothorax. Improved mild interstitial edema with left greater than right small bilateral pleural effusions and adjacent Airspace disease, likely atelectasis. Aortic Atherosclerosis (ICD10-I70.0). Electronically Signed   By: Abigail Miyamoto M.D.   On: 09/30/2020 11:29   DG Chest Portable 1 View  Result Date: 09/30/2020 CLINICAL DATA:  Chest pain and shortness of breath EXAM: PORTABLE CHEST 1 VIEW COMPARISON:  11/30/2019 FINDINGS: Bilateral pleural effusion, large appearing on the left. Interstitial and hazy airspace density on both sides with cephalized blood flow. No pneumothorax. Partially obscured heart. IMPRESSION: CHF  pattern with large left and small right pleural effusion. Electronically Signed   By: Monte Fantasia M.D.   On: 09/30/2020 07:16   DG Fluoro Guide CV Line-No Report  Result Date: 10/05/2020 Fluoroscopy was utilized by the requesting physician.  No radiographic interpretation.   ECHOCARDIOGRAM COMPLETE  Result Date: 09/30/2020    ECHOCARDIOGRAM REPORT   Patient Name:   NORFLEET CAPERS Date of Exam: 09/30/2020 Medical Rec #:  [id number]    Height:       69.0 in Accession #:    [id number]   Weight:        240.0 lb Date of Birth:  [birth year]    BSA:          2.232 m Patient Age:    74 years     BP:           167/58 mmHg Patient Gender: M            HR:           74 bpm. Exam Location:  [facility] Procedure: 2D Echo Indications:     Dyspnea 786.09 / R06.00  History:         Patient has prior history of Echocardiogram examinations, most                  recent 09/24/2019. CHF; Risk Factors:Former Smoker,                  Dyslipidemia, Hypertension and Diabetes. Nonrheumatic aortic                  valve insufficiency.  Sonographer:     Leavy Cella RDCS (AE) Referring Phys:  [id number] Arnoldo Lenis Diagnosing Phys: Carlyle Dolly MD IMPRESSIONS  1. Left ventricular ejection fraction, by estimation, is 55 to 60%. The left ventricle has normal function. The left ventricle has no regional wall motion abnormalities. There is severe left ventricular hypertrophy. Left ventricular diastolic parameters  are indeterminate. Elevated left atrial pressure.  2. Right ventricular systolic function is normal. The right ventricular size is normal.  3. A small pericardial effusion is present. The pericardial effusion is circumferential. There is no evidence of cardiac tamponade. Large pleural effusion in the left lateral region.  4. The mitral valve is normal in structure. Trivial mitral valve regurgitation. No evidence of mitral stenosis.  5. The aortic valve was not well visualized. There is mild calcification of the aortic valve. There is mild thickening of the aortic valve. Aortic valve regurgitation is moderate. Moderate aortic valve stenosis.  6. The inferior vena cava is normal in size with greater than 50% respiratory variability, suggesting right atrial pressure of 3 mmHg. FINDINGS  Left Ventricle: Left ventricular ejection fraction, by estimation, is 55 to 60%. The left ventricle has normal function. The left ventricle has no regional wall motion abnormalities. The left ventricular internal cavity size was normal  in size. There is  severe left ventricular hypertrophy. Left ventricular diastolic parameters are indeterminate. Elevated left atrial pressure. Right Ventricle: The right ventricular size is normal. No increase in right ventricular wall thickness. Right ventricular systolic function is normal. Left Atrium: Left atrial size was normal in size. Right Atrium: Right atrial size was normal in size. Pericardium: A small pericardial effusion is present. The pericardial effusion is circumferential. There is no evidence of cardiac tamponade. Mitral Valve: The mitral valve is normal in structure. There is mild thickening of the mitral valve leaflet(s). There is mild calcification of the  mitral valve leaflet(s). Mild mitral annular calcification. Trivial mitral valve regurgitation. No evidence  of mitral valve stenosis. Tricuspid Valve: The tricuspid valve is not well visualized. Tricuspid valve regurgitation is mild . No evidence of tricuspid stenosis. Aortic Valve: The aortic valve was not well visualized. There is mild calcification of the aortic valve. There is mild thickening of the aortic valve. There is mild aortic valve annular calcification. Aortic valve regurgitation is moderate. Aortic regurgitation PHT measures 349 msec. Moderate aortic stenosis is present. Aortic valve mean gradient measures 19.4 mmHg. Aortic valve peak gradient measures 34.7 mmHg. Aortic valve area, by VTI measures 1.30 cm. Pulmonic Valve: The pulmonic valve was not well visualized. Pulmonic valve regurgitation is not visualized. No evidence of pulmonic stenosis. Aorta: The aortic root is normal in size and structure. Pulmonary Artery: Indeterminate PASP, inadequate TR jet. Venous: The inferior vena cava is normal in size with greater than 50% respiratory variability, suggesting right atrial pressure of 3 mmHg. IAS/Shunts: No atrial level shunt detected by color flow Doppler. Additional Comments: There is a large pleural effusion in the left  lateral region.  LEFT VENTRICLE PLAX 2D LVIDd:         5.34 cm  Diastology LVIDs:         2.37 cm  LV e' lateral:   3.59 cm/s LV PW:         2.03 cm  LV E/e' lateral: 34.3 LV IVS:        1.69 cm LVOT diam:     2.10 cm LV SV:         87 LV SV Index:   39 LVOT Area:     3.46 cm  RIGHT VENTRICLE RV S prime:     14.20 cm/s TAPSE (M-mode): 2.2 cm LEFT ATRIUM             Index       RIGHT ATRIUM          Index LA diam:        3.40 cm 1.52 cm/m  RA Area:     8.96 cm LA Vol (A2C):   70.3 ml 31.49 ml/m RA Volume:   17.80 ml 7.97 ml/m LA Vol (A4C):   47.8 ml 21.41 ml/m LA Biplane Vol: 58.7 ml 26.29 ml/m  AORTIC VALVE AV Area (Vmax):    1.29 cm AV Area (Vmean):   1.24 cm AV Area (VTI):     1.30 cm AV Vmax:           294.66 cm/s AV Vmean:          207.077 cm/s AV VTI:            0.669 m AV Peak Grad:      34.7 mmHg AV Mean Grad:      19.4 mmHg LVOT Vmax:         109.33 cm/s LVOT Vmean:        73.863 cm/s LVOT VTI:          0.252 m LVOT/AV VTI ratio: 0.38 AI PHT:            349 msec  AORTA Ao Root diam: 3.10 cm MITRAL VALVE MV Area (PHT): 3.23 cm     SHUNTS MV Decel Time: 235 msec     Systemic VTI:  0.25 m MV E velocity: 123.00 cm/s  Systemic Diam: 2.10 cm MV A velocity: 101.00 cm/s MV E/A ratio:  1.22 Carlyle Dolly MD Electronically signed by Carlyle Dolly MD Signature  Date/Time: 09/30/2020/12:04:11 PM    Final (Updated)    DG Hip Unilat W or Wo Pelvis 2-3 Views Right  Result Date: 11/11/2020 CLINICAL DATA:  79 year old male with right hip pain status post fall yesterday. EXAM: DG HIP (WITH OR WITHOUT PELVIS) 2-3V RIGHT COMPARISON:  None. FINDINGS: There is no evidence of hip fracture or dislocation. 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 Degenerative changes of the lower lumbar spine soft tissues are unremarkable. IMPRESSION: 1. No  acute fracture or malalignment. 2. Symmetric fraying of pelvic entheses as could be seen with renal osteodystrophy. Electronically Signed   By: Ruthann Cancer MD   On: 10/19/2020 14:59   US THORACENTESIS ASP PLEURAL SPACE W/IMG GUIDE  Result Date: 09/30/2020 INDICATION: Left pleural effusion CHF EXAM: ULTRASOUND GUIDED LEFT THORACENTESIS MEDICATIONS: 10 cc 1% lidocaine. COMPLICATIONS: None immediate. PROCEDURE: An ultrasound guided thoracentesis was thoroughly discussed with the patient and questions answered. The benefits, risks, alternatives and complications were also discussed. The patient understands and wishes to proceed with the procedure. Written consent was obtained. Ultrasound was performed to localize and mark an adequate pocket of fluid in the left chest. The area was then prepped and draped in the normal sterile fashion. 1% Lidocaine was used for local anesthesia. Under ultrasound guidance a 19 G Yueh catheter was introduced. Thoracentesis was performed. The catheter was removed and a dressing applied. FINDINGS: A total of approximately 1 liter of blood tinged fluid was removed. Samples were sent to the laboratory as requested by the clinical team. IMPRESSION: Successful ultrasound guided left thoracentesis yielding 1 liter of pleural fluid. No PTX post procedure CXR Read by Lavonia Drafts [facility] Electronically Signed   By: Abigail Miyamoto M.D.   On: 09/30/2020 11:30     Discharge Exam: Vitals:   10/28/20 1126 10/28/20 1347  BP: (!) 122/47 (!) 135/52  Pulse: (!) 111 (!) 117  Resp: 19 18  Temp: 98.7 F (37.1 C) 99.9 F (37.7 C)  SpO2: 94% 96%   Vitals:   10/27/20 2013 10/28/20 0348 10/28/20 1126 10/28/20 1347  BP: (!) 129/45 137/60 (!) 122/47 (!) 135/52  Pulse: 92 94 (!) 111 (!) 117  Resp: 17 19 19 18   Temp: 98.5 F (36.9 C) 98 F (36.7 C) 98.7 F (37.1 C) 99.9 F (37.7 C)  TempSrc:   Oral Oral  SpO2: 94% 95% 94% 96%  Weight:      Height:        General: Pt is alert, awake,  not in acute distress Cardiovascular: RRR, S1/S2 +, no rubs, no gallops Respiratory: CTA bilaterally, no wheezing, no rhonchi Abdominal: Soft, NT, ND, bowel sounds + Extremities: no edema, no cyanosis Foley catheter with minimal hematuria noted-clearing.    The results of significant diagnostics from this hospitalization (including imaging, microbiology, ancillary and laboratory) are listed below for reference.     Microbiology: Recent Results (from the past 240 hour(s))  Urine culture     Status: Abnormal   Collection Time: 10/22/2020  1:44 PM   Specimen: Urine, Catheterized  Result Value Ref Range Status   Specimen Description   Final    URINE, CATHETERIZED Performed at [facility], [address] [place], [place] [address]    Special Requests   Final    NONE Performed at [facility], [address], [place], [place] [address]    Culture >=100,000 COLONIES/mL PSEUDOMONAS AERUGINOSA (  A)  Final   Report Status 10/26/2020 FINAL  Final   Organism ID, Bacteria PSEUDOMONAS AERUGINOSA (A)  Final      Susceptibility   Pseudomonas aeruginosa - MIC*    CEFTAZIDIME 4 SENSITIVE Sensitive     CIPROFLOXACIN <=0.25 SENSITIVE Sensitive     GENTAMICIN <=1 SENSITIVE Sensitive     IMIPENEM 2 SENSITIVE Sensitive     PIP/TAZO 8 SENSITIVE Sensitive     CEFEPIME 2 SENSITIVE Sensitive     * >=100,000 COLONIES/mL PSEUDOMONAS AERUGINOSA  Blood culture (routine single)     Status: None   Collection Time: 10/20/2020  3:03 PM   Specimen: Right Antecubital; Blood  Result Value Ref Range Status   Specimen Description   Final    RIGHT ANTECUBITAL BOTTLES DRAWN AEROBIC AND ANAEROBIC   Special Requests Blood Culture adequate volume  Final   Culture   Final    NO GROWTH 5 DAYS Performed at [facility], [address] [place], [place] [address]    Report Status 10/28/2020 FINAL  Final  Resp Panel by RT-PCR (Flu A&B, Covid) Nasopharyngeal Swab     Status: None   Collection Time: 11/10/2020  3:56 PM    Specimen: Nasopharyngeal Swab; Nasopharyngeal(NP) swabs in vial transport medium  Result Value Ref Range Status   SARS Coronavirus 2 by RT PCR NEGATIVE NEGATIVE Final    Comment: (NOTE) SARS-CoV-2 target nucleic acids are NOT DETECTED.  The SARS-CoV-2 RNA is generally detectable in upper respiratory specimens during the acute phase of infection. The lowest concentration of SARS-CoV-2 viral copies this assay can detect is 138 copies/mL. A negative result does not preclude SARS-Cov-2 infection and should not be used as the sole basis for treatment or other patient management decisions. A negative result may occur with  improper specimen collection/handling, submission of specimen other than nasopharyngeal swab, presence of viral mutation(s) within the areas targeted by this assay, and inadequate number of viral copies(<138 copies/mL). A negative result must be combined with clinical observations, patient history, and epidemiological information. The expected result is Negative.  Fact Sheet for Patients:  EntrepreneurPulse.com.au  Fact Sheet for Healthcare Providers:  IncredibleEmployment.be  This test is no t yet approved or cleared by the [place] FDA and  has been authorized for detection and/or diagnosis of SARS-CoV-2 by FDA under an Emergency Use Authorization (EUA). This EUA will remain  in effect (meaning this test can be used) for the duration of the COVID-19 declaration under Section 564(b)(1) of the Act, 21 U.S.C.section 360bbb-3(b)(1), unless the authorization is terminated  or revoked sooner.       Influenza A by PCR NEGATIVE NEGATIVE Final   Influenza B by PCR NEGATIVE NEGATIVE Final    Comment: (NOTE) The Xpert Xpress SARS-CoV-2/FLU/RSV plus assay is intended as an aid in the diagnosis of influenza from Nasopharyngeal swab specimens and should not be used as a sole basis for treatment. Nasal washings and aspirates are  unacceptable for Xpert Xpress SARS-CoV-2/FLU/RSV testing.  Fact Sheet for Patients: EntrepreneurPulse.com.au  Fact Sheet for Healthcare Providers: IncredibleEmployment.be  This test is not yet approved or cleared by the [place] FDA and has been authorized for detection and/or diagnosis of SARS-CoV-2 by FDA under an Emergency Use Authorization (EUA). This EUA will remain in effect (meaning this test can be used) for the duration of the COVID-19 declaration under Section 564(b)(1) of the Act, 21 U.S.C. section 360bbb-3(b)(1), unless the authorization is terminated or revoked.  Performed at [facility], [place]  [address], [place], [place] [address]   MRSA PCR Screening     Status: None   Collection Time: 10/15/2020 10:38 PM   Specimen: Nasal Mucosa; Nasopharyngeal  Result Value Ref Range Status   MRSA by PCR NEGATIVE NEGATIVE Final    Comment:        The GeneXpert MRSA Assay (FDA approved for NASAL specimens only), is one component of a comprehensive MRSA colonization surveillance program. It is not intended to diagnose MRSA infection nor to guide or monitor treatment for MRSA infections. Performed at [facility], [address] [place], [place] [address]      Labs: BNP (last 3 results) Recent Labs    11/30/19 1110 09/30/20 0654  BNP 958.0* 7,672.0*   Basic Metabolic Panel: Recent Labs  Lab 10/24/20 0507 10/25/20 0529 10/26/20 0558 10/27/20 0407 10/28/20 0532  NA 131*  131* 133* 133* 134* 134*  K 4.3  4.4 4.3 3.7 3.9 4.2  CL 94*  94* 94* 94* 94* 95*  CO2 19*  19* 20* 26 23 22   GLUCOSE 111*  110* 74 93 89 103*  BUN 88*  86* 105* 58* 71* 86*  CREATININE 12.81*  12.82* 13.54* 8.42* 10.34* 11.72*  CALCIUM 6.5*  6.5* 6.5* 6.8* 6.8* 6.9*  PHOS 8.3* 9.1* 5.4* 6.3* 6.9*   Liver Function Tests: Recent Labs  Lab 11/04/2020 1503 10/24/20 0507 10/25/20 0529 10/26/20 0558 10/27/20 0407 10/28/20 0532  AST 43*  --   --    --   --   --   ALT 29  --   --   --   --   --   ALKPHOS 75  --   --   --   --   --   BILITOT 0.7  --   --   --   --   --   PROT 7.8  --   --   --   --   --   ALBUMIN 2.7* 2.0* 2.0* 2.0* 2.1* 2.1*   No results for input(s): LIPASE, AMYLASE in the last 168 hours. No results for input(s): AMMONIA in the last 168 hours. CBC: Recent Labs  Lab 10/31/2020 1503 10/24/20 0507 10/25/20 0529 10/26/20 0558 10/27/20 0407 10/28/20 0532  WBC 27.8* 21.7* 16.0* 10.0 8.4 9.7  NEUTROABS 23.6*  --  13.6* 7.8* 5.8 6.4  HGB 9.8* 8.0* 7.6* 8.1* 8.1* 8.7*  HCT 31.2* 25.2* 24.2* 25.3* 24.9* 27.7*  MCV 92.3 91.0 92.0 89.7 91.2 91.1  PLT 268 217 213 237 220 234   Cardiac Enzymes: No results for input(s): CKTOTAL, CKMB, CKMBINDEX, TROPONINI in the last 168 hours. BNP: Invalid input(s): POCBNP CBG: Recent Labs  Lab 10/27/20 1108 10/27/20 1650 10/27/20 2141 10/28/20 0745 10/28/20 1100  GLUCAP 124* 107* 167* 99 132*   D-Dimer No results for input(s): DDIMER in the last 72 hours. Hgb A1c No results for input(s): HGBA1C in the last 72 hours. Lipid Profile No results for input(s): CHOL, HDL, LDLCALC, TRIG, CHOLHDL, LDLDIRECT in the last 72 hours. Thyroid function studies No results for input(s): TSH, T4TOTAL, T3FREE, THYROIDAB in the last 72 hours.  Invalid input(s): FREET3 Anemia work up No results for input(s): VITAMINB12, FOLATE, FERRITIN, TIBC, IRON, RETICCTPCT in the last 72 hours. Urinalysis    Component Value Date/Time   COLORURINE AMBER (A) 10/28/2020 1344   APPEARANCEUR CLOUDY (A) 11/05/2020 1344   LABSPEC 1.015 11/05/2020 1344   PHURINE 6.0 10/25/2020 1344   GLUCOSEU 50 (A) 11/01/2020 1344   HGBUR SMALL (A)  10/13/2020 [place] 10/19/2020 1344   KETONESUR NEGATIVE 11/07/2020 1344   PROTEINUR >=300 (A) 11/10/2020 1344   UROBILINOGEN 1.0 02/23/2013 1136   NITRITE NEGATIVE 11/04/2020 1344   LEUKOCYTESUR LARGE (A) 10/24/2020 1344   Sepsis Labs Invalid  input(s): PROCALCITONIN,  WBC,  LACTICIDVEN Microbiology Recent Results (from the past 240 hour(s))  Urine culture     Status: Abnormal   Collection Time: 11/11/2020  1:44 PM   Specimen: Urine, Catheterized  Result Value Ref Range Status   Specimen Description   Final    URINE, CATHETERIZED Performed at [facility], [address], [place], [place] [address]    Special Requests   Final    NONE Performed at [facility], [address], [place], [place] [address]    Culture >=100,000 COLONIES/mL PSEUDOMONAS AERUGINOSA (A)  Final   Report Status 10/26/2020 FINAL  Final   Organism ID, Bacteria PSEUDOMONAS AERUGINOSA (A)  Final      Susceptibility   Pseudomonas aeruginosa - MIC*    CEFTAZIDIME 4 SENSITIVE Sensitive     CIPROFLOXACIN <=0.25 SENSITIVE Sensitive     GENTAMICIN <=1 SENSITIVE Sensitive     IMIPENEM 2 SENSITIVE Sensitive     PIP/TAZO 8 SENSITIVE Sensitive     CEFEPIME 2 SENSITIVE Sensitive     * >=100,000 COLONIES/mL PSEUDOMONAS AERUGINOSA  Blood culture (routine single)     Status: None   Collection Time: 10/30/2020  3:03 PM   Specimen: Right Antecubital; Blood  Result Value Ref Range Status   Specimen Description   Final    RIGHT ANTECUBITAL BOTTLES DRAWN AEROBIC AND ANAEROBIC   Special Requests Blood Culture adequate volume  Final   Culture   Final    NO GROWTH 5 DAYS Performed at [facility], [address], [place], [place] [address]    Report Status 10/28/2020 FINAL  Final  Resp Panel by RT-PCR (Flu A&B, Covid) Nasopharyngeal Swab     Status: None   Collection Time: 10/22/2020  3:56 PM   Specimen: Nasopharyngeal Swab; Nasopharyngeal(NP) swabs in vial transport medium  Result Value Ref Range Status   SARS Coronavirus 2 by RT PCR NEGATIVE NEGATIVE Final    Comment: (NOTE) SARS-CoV-2 target nucleic acids are NOT DETECTED.  The SARS-CoV-2 RNA is generally detectable in upper respiratory specimens during the acute phase of infection. The lowest concentration of  SARS-CoV-2 viral copies this assay can detect is 138 copies/mL. A negative result does not preclude SARS-Cov-2 infection and should not be used as the sole basis for treatment or other patient management decisions. A negative result may occur with  improper specimen collection/handling, submission of specimen other than nasopharyngeal swab, presence of viral mutation(s) within the areas targeted by this assay, and inadequate number of viral copies(<138 copies/mL). A negative result must be combined with clinical observations, patient history, and epidemiological information. The expected result is Negative.  Fact Sheet for Patients:  EntrepreneurPulse.com.au  Fact Sheet for Healthcare Providers:  IncredibleEmployment.be  This test is no t yet approved or cleared by the [place] FDA and  has been authorized for detection and/or diagnosis of SARS-CoV-2 by FDA under an Emergency Use Authorization (EUA). This EUA will remain  in effect (meaning this test can be used) for the duration of the COVID-19 declaration under Section 564(b)(1) of the Act, 21 U.S.C.section 360bbb-3(b)(1), unless the authorization is terminated  or revoked sooner.       Influenza A by PCR NEGATIVE NEGATIVE Final   Influenza B  by PCR NEGATIVE NEGATIVE Final    Comment: (NOTE) The Xpert Xpress SARS-CoV-2/FLU/RSV plus assay is intended as an aid in the diagnosis of influenza from Nasopharyngeal swab specimens and should not be used as a sole basis for treatment. Nasal washings and aspirates are unacceptable for Xpert Xpress SARS-CoV-2/FLU/RSV testing.  Fact Sheet for Patients: EntrepreneurPulse.com.au  Fact Sheet for Healthcare Providers: IncredibleEmployment.be  This test is not yet approved or cleared by the [place] FDA and has been authorized for detection and/or diagnosis of SARS-CoV-2 by FDA under an Emergency Use  Authorization (EUA). This EUA will remain in effect (meaning this test can be used) for the duration of the COVID-19 declaration under Section 564(b)(1) of the Act, 21 U.S.C. section 360bbb-3(b)(1), unless the authorization is terminated or revoked.  Performed at [facility], [address] [place], [place] [address]   MRSA PCR Screening     Status: None   Collection Time: 11/09/2020 10:38 PM   Specimen: Nasal Mucosa; Nasopharyngeal  Result Value Ref Range Status   MRSA by PCR NEGATIVE NEGATIVE Final    Comment:        The GeneXpert MRSA Assay (FDA approved for NASAL specimens only), is one component of a comprehensive MRSA colonization surveillance program. It is not intended to diagnose MRSA infection nor to guide or monitor treatment for MRSA infections. Performed at [facility], [address] [place], [place] [address]      Time coordinating discharge: 35 minutes  SIGNED:   Rodena Goldmann, DO [facility] 10/28/2020, 1:55 PM  If 7PM-7AM, please contact night-coverage [email or web address]

## 2020-10-28 NOTE — TOC Transition Note (Signed)
Transition of Care [facility]) - CM/SW Discharge Note   Patient Details  Name: Michael Trevino MRN: [id number] Date of Birth: [birth year]  [facility]) CM/SW Contact:  Boneta Lucks, RN Phone Number: 10/28/2020, 2:36 PM   Clinical Narrative:   Discharge summary sent in the hub, Waiting on COVID test results. Medical necessity printing and taken to 300, Patient is in dialysis at present time, RN to call report and have Network engineer call EMS.    Final next level of care: Skilled Nursing Facility Barriers to Discharge: Barriers Resolved   Patient Goals and CMS Choice Patient states their goals for this hospitalization and ongoing recovery are:: to return to rehab. CMS Medicare.gov Compare Post Acute Care list provided to:: Patient Represenative (must comment) Choice offered to / list presented to : Adult Children [name])  Discharge Placement              Patient chooses bed at: Other - please specify in the comment section below: ([facility]) Patient to be transferred to facility by: EMS Name of family member notified: [name] - son Patient and family notified of of transfer: 10/28/20   Readmission Risk Interventions Readmission Risk Prevention Plan 10/28/2020 10/25/2020  Transportation Screening Complete Complete  Palliative Care Screening - Not Applicable  Medication Review ([place]) Complete Complete  PCP or Specialist appointment within 3-5 days of discharge Complete -  [place] or Home Care Consult Complete -  SW Recovery Care/Counseling Consult Complete -  Palliative Care Screening Complete -  [place] Complete -  Some recent data might be hidden

## 2020-10-28 NOTE — Progress Notes (Addendum)
7:11PM RN called due to patient being unresponsive with patient being in A. fib with RVR and HR of 150-160 and MAP of 55-60.  BP was 75/45.  RN was asked to start IV bolus of 250 mL and to contact [place] pending arrival at bedside. Apparently patient was alert and interactive prior to dialysis, but was noted to go into A. fib with RVR and became unresponsive afterwards. At bedside, patient was not responsive to deep sternal rub, but withdraws to pain stimuli.  Patient was unable to protect airway.  CBC, CMP, lactic acid, ammonia level, magnesium level were ordered. Amiodarone drip was ordered by PCCM, but patient continued to be in A. fib after IV bolus of amiodarone, patient was intubated by ED physician and was started on IV Neo-Synephrine.  Propofol drip was started for sedation and IV fentanyl drip as needed was started for severe pain by PCCM.  Vent management per PCCM Lactic acid was 2.0, we shall continue to trend lactic acid Ammonia level and magnesium levels were normal PCCM consult and recommendation highly appreciated Patient was admitted due to acute metabolic encephalopathy superimposed on progressive dementia with delirium in the setting of Pseudomonas UTI with acute urinary retention.  Patient was already being treated with antibiotics (cefepime). CT head without contrast done showed no acute intracranial pathology We shall continue to monitor patient and treat accordingly. Patient's son already communicated with PCCM with current status being full code.  All questions answered to satisfaction.

## 2020-10-28 NOTE — Progress Notes (Signed)
   10/28/20 1715  Assess: MEWS Score  BP (!) 85/47  Pulse Rate 85  Assess: MEWS Score  MEWS Temp 0  MEWS Systolic 1  MEWS Pulse 0  MEWS RR 0  MEWS LOC 1  MEWS Score 2  MEWS Score Color Yellow  Assess: if the MEWS score is Yellow or Red  Were vital signs taken at a resting state? No  Focused Assessment Change from prior assessment (see assessment flowsheet)  Early Detection of Sepsis Score *See Row Information* Low  MEWS guidelines implemented *See Row Information* No, other (Comment) (Patient is getting dialysis)  Escalate  MEWS: Escalate Yellow: discuss with charge nurse/RN and consider discussing with provider and RRT  Notify: Charge Nurse/RN  Name of Charge Nurse/RN Notified C. Doy Mince, RN  Date Charge Nurse/RN Notified 10/28/20  Time Charge Nurse/RN Notified 1715  Document  Patient Outcome Other (Comment) (Patient is being monitored in dialysis)  Progress note created (see row info) Yes

## 2020-10-28 NOTE — TOC Transition Note (Signed)
Transition of Care [facility]) - CM/SW Discharge Note   Patient Details  Name: Courvoisier Hamblen MRN: [id number] Date of Birth: 1941/08/05  [facility]) CM/SW Contact:  Boneta Lucks, RN Phone Number: 10/28/2020, 1:30 PM   Clinical Narrative:   Patient is medially ready for discharge back to [facility], Patient needs a COVID test. MD and RN updated. Palliative consulted TOC for outpatient palliative. Mardene Celeste states they use [facility]. Referral made to [place] at [facility] for palliative visit. Sent in the hub and called. They will see patient next week. TOC to call EMS when RN is ready.   Final next level of care: Skilled Nursing Facility Barriers to Discharge: Barriers Resolved   Patient Goals and CMS Choice Patient states their goals for this hospitalization and ongoing recovery are:: to return to rehab. CMS Medicare.gov Compare Post Acute Care list provided to:: Patient Represenative (must comment) Choice offered to / list presented to : Adult Children [name])  Discharge Placement              Patient chooses bed at: Other - please specify in the comment section below: (UNCR) Patient to be transferred to facility by: EMS Name of family member notified: [name] - son Patient and family notified of of transfer: 10/28/20  Discharge Plan and Services   UNCR  With outpatient Palliative visit     Readmission Risk Interventions Readmission Risk Prevention Plan 10/25/2020  Transportation Screening Complete  Palliative Care Screening Not Applicable  Medication Review (RN Care Manager) Complete  Some recent data might be hidden

## 2020-10-29 ENCOUNTER — Inpatient Hospital Stay (HOSPITAL_COMMUNITY): Payer: Medicare Other

## 2020-10-29 ENCOUNTER — Other Ambulatory Visit (HOSPITAL_COMMUNITY): Payer: Medicare Other

## 2020-10-29 ENCOUNTER — Encounter (HOSPITAL_COMMUNITY): Payer: Self-pay | Admitting: Internal Medicine

## 2020-10-29 DIAGNOSIS — I10 Essential (primary) hypertension: Secondary | ICD-10-CM

## 2020-10-29 LAB — RENAL FUNCTION PANEL
Albumin: 1.9 g/dL — ABNORMAL LOW (ref 3.5–5.0)
BUN: 54 mg/dL — ABNORMAL HIGH (ref 8–23)
CO2: 19 mmol/L — ABNORMAL LOW (ref 22–32)
Calcium: 6.5 mg/dL — ABNORMAL LOW (ref 8.9–10.3)
Chloride: 87 mmol/L — ABNORMAL LOW (ref 98–111)
Creatinine, Ser: 8.29 mg/dL — ABNORMAL HIGH (ref 0.61–1.24)
GFR, Estimated: 6 mL/min — ABNORMAL LOW (ref >60)
Glucose, Bld: 437 mg/dL — ABNORMAL HIGH (ref 70–99)
Phosphorus: 7.6 mg/dL — ABNORMAL HIGH (ref 2.5–4.6)
Potassium: 5 mmol/L (ref 3.5–5.1)
Sodium: 128 mmol/L — ABNORMAL LOW (ref 135–145)

## 2020-10-29 LAB — T4, FREE: Free T4: 0.87 ng/dL (ref 0.61–1.12)

## 2020-10-29 LAB — CBC WITH DIFFERENTIAL/PLATELET
Abs Immature Granulocytes: 0.9 10*3/uL — ABNORMAL HIGH (ref 0.00–0.07)
Basophils Absolute: 0.1 10*3/uL (ref 0.0–0.1)
Basophils Relative: 0 %
Eosinophils Absolute: 0 10*3/uL (ref 0.0–0.5)
Eosinophils Relative: 0 %
HCT: 25 % — ABNORMAL LOW (ref 39.0–52.0)
Hemoglobin: 8.2 g/dL — ABNORMAL LOW (ref 13.0–17.0)
Immature Granulocytes: 5 %
Lymphocytes Relative: 7 %
Lymphs Abs: 1.5 10*3/uL (ref 0.7–4.0)
MCH: 29.6 pg (ref 26.0–34.0)
MCHC: 32.8 g/dL (ref 30.0–36.0)
MCV: 90.3 fL (ref 80.0–100.0)
Monocytes Absolute: 2.5 10*3/uL — ABNORMAL HIGH (ref 0.1–1.0)
Monocytes Relative: 12 %
Neutro Abs: 15.2 10*3/uL — ABNORMAL HIGH (ref 1.7–7.7)
Neutrophils Relative %: 76 %
Platelets: 222 10*3/uL (ref 150–400)
RBC: 2.77 MIL/uL — ABNORMAL LOW (ref 4.22–5.81)
RDW: 14.1 % (ref 11.5–15.5)
WBC: 20.2 10*3/uL — ABNORMAL HIGH (ref 4.0–10.5)
nRBC: 0.1 % (ref 0.0–0.2)

## 2020-10-29 LAB — GLUCOSE, CAPILLARY
Glucose-Capillary: 134 mg/dL — ABNORMAL HIGH (ref 70–99)
Glucose-Capillary: 187 mg/dL — ABNORMAL HIGH (ref 70–99)
Glucose-Capillary: 237 mg/dL — ABNORMAL HIGH (ref 70–99)
Glucose-Capillary: 247 mg/dL — ABNORMAL HIGH (ref 70–99)

## 2020-10-29 MED ORDER — PHENYLEPHRINE CONCENTRATED 100MG/250ML (0.4 MG/ML) INFUSION SIMPLE
0.0000 ug/min | INTRAVENOUS | Status: DC
  Administered 2020-10-29: 200 ug/min via INTRAVENOUS
  Filled 2020-10-29 (×6): qty 250

## 2020-11-01 ENCOUNTER — Encounter: Payer: Medicare Other | Admitting: Vascular Surgery

## 2020-11-01 ENCOUNTER — Ambulatory Visit: Payer: Medicare Other | Admitting: Cardiology

## 2020-11-01 MED FILL — Sodium Chloride IV Soln 0.9%: INTRAVENOUS | Qty: 250 | Status: AC

## 2020-11-01 MED FILL — Phenylephrine HCl IV Soln 10 MG/ML: INTRAVENOUS | Qty: 10 | Status: AC

## 2020-11-02 ENCOUNTER — Other Ambulatory Visit: Payer: Self-pay | Admitting: Family Medicine

## 2020-11-13 NOTE — Progress Notes (Signed)
Late entry  At start of shift patient had recently arrived from med/surg unit and was unresponsive with HR between 150-160 in Afib RVR on EKG. Contacted MD to see patient. Paged [place]. Patient given amiodarone bolus and started on drip. Patient intubated by EDP. Orders placed by [facility] physician. Son at bedside agreed with decision to intubate. See MAR for medication details.

## 2020-11-13 NOTE — Progress Notes (Signed)
PROGRESS NOTE    Michael Trevino  YCX:[id number] DOB: 1941-05-25 DOA: 11/01/2020 PCP: Dettinger, Fransisca Kaufmann, MD   Brief Narrative:  80 y.o.male,malewith past medical history relevant for HTN, DM2,nephrotic syndrome,ESRD, started hemodialysis last month, hypothyroidism, CADwith recent admission due toNSTEMI, and HLD and obesity. -Patient with recent hospitalization at [facility], where hemodialysis was initiated, where he was discharged to SNF, patient was brought by the facility due to fever, and hip pain, altered mental status, initially patient was altered per ED physician, but this has improved upon my evaluation, son was at bedside helps with the history, patient with fever 101.2 per EMS, for which she received Tylenol, son report patient transfused 2 units PRBC on Thursday, so he missed dialysis that day, so he was dialyzed on Friday, he was supposed to be dialyzed today as well,as well patient with complaints of right hip pain for last 2 to 3 days. - in EDpatient was noticed to be with urinary retention, 1200 cc of foul-smelling purulent urine drained after Foley catheter insertion, work-up significant for potassium of 5.2, creatinine of 12.1, white blood cell count of 27.8, but normal lactic acid, hemoglobin at 9.8, chest x-ray with no acute findings, UA with elevated leukocytes, but no pyuria, but hip x-ray with no evidence of fracture, [facility] consulted to admit.  -Patient was admitted for acute metabolic encephalopathy superimposed on progressive dementia with delirium in the setting of Pseudomonas UTI with acute urinary retention.  He was going to be discharged to SNF on 12/16 after hemodialysis, but unfortunately became unresponsive with tachycardia and hypotension noted.  He was sent down to stepdown unit and fluid boluses were attempted, but without improvement.  PCCM via E-link became involved and had recommended intubation due to for airway protection as well as  head CT and other labs.  Head CT with no acute findings noted.  Patient was intubated and was noted to be in atrial fibrillation with RVR for which amiodarone was initiated.  He is also on phenylephrine for blood pressure support.  Discussion with spouse and son at bedside who had on 12/17.  They are like for him to be DNR and comfort care.  Anticipate terminal extubation by 12/19 to allow family members to visit.    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 (HCC)   Essential hypertension, benign   CHF (congestive heart failure) (HCC)   ESRD (end stage renal disease) ([place])   Acute urinary retention   Hip pain   Encephalopathy acute   Hypotension with decreased responsiveness -Continues on Neo-Synephrine for now and plan to wean as tolerated -Family members would not like central line -No need for further evaluation to include 2D echocardiogram as patient is comfort care  Acute metabolic encephalopathy versus likely progression of dementia -Palliative care involved in discussions with family members earlier during this hospitalization -Family members are agreeable that quality of life will be quite poor and that comfort measures and terminal extubation in a couple days would be most ideal  New onset atrial fibrillation with RVR -Currently converted to NSR -Remains on amiodarone drip which will be continued for now -Hold further anticoagulation  Hypothyroidism -Free T4 pending, but patient now comfort care  CAD status post recent NSTEMI -Hold medications now comfort care  Recent Pseudomonas UTI -Completed course of treatment with antibiotics to include cefepime  ESRD on HD TTS -No further hemodialysis  Right hip pain -No fracture noted on imaging   DVT prophylaxis: SCDs Code Status: DNR/comfort care Family Communication: Discussed with son and spouse at bedside  12/17 Disposition Plan:  Status is: Inpatient  Remains inpatient appropriate because:Altered mental status and Unsafe d/c plan   Dispo: The patient is from:Home              Anticipated d/c is to: Terminal extubation by 11/12/2023              Anticipated d/c date is: 11/12/2023, in-hospital death              Patient currently is not medically stable to d/c.   Consultants:   Palliative care  Nephrology  Cardiology  Procedures:   See below  Intubation on 12/16  Antimicrobials:  Anti-infectives (From admission, onward)   Start     Dose/Rate Route Frequency Ordered Stop   10/28/20 1200  ceFEPIme (MAXIPIME) 2 g in sodium chloride 0.9 % 100 mL IVPB  Status:  Discontinued        2 g 200 mL/hr over 30 Minutes Intravenous Every T-Th-Sa (Hemodialysis) 10/26/20 0815 09-Nov-2020 0956   10/28/20 0000  ciprofloxacin (CIPRO) 500 MG tablet  Status:  Discontinued        500 mg Oral 2 times daily 10/28/20 1334 10/28/20    10/25/20 1200  ceFEPIme (MAXIPIME) 2 g in sodium chloride 0.9 % 100 mL IVPB  Status:  Discontinued        2 g 200 mL/hr over 30 Minutes Intravenous Every M-W-F (Hemodialysis) 10/24/20 1247 10/26/20 0815   10/22/2020 1800  vancomycin (VANCOREADY) IVPB 1500 mg/300 mL        1,500 mg 150 mL/hr over 120 Minutes Intravenous  Once 10/25/2020 1631 10/31/2020 1925   11/10/2020 1645  ceFEPIme (MAXIPIME) 2 g in sodium chloride 0.9 % 100 mL IVPB        2 g 200 mL/hr over 30 Minutes Intravenous  Once 10/18/2020 1631 11/09/2020 1713   10/15/2020 1615  cefTRIAXone (ROCEPHIN) 1 g in sodium chloride 0.9 % 100 mL IVPB  Status:  Discontinued        1 g 200 mL/hr over 30 Minutes Intravenous  Once 10/20/2020 1602 10/21/2020 1626       Subjective: Patient is unresponsive on ventilator.  Events as noted above overnight.  Objective: Vitals:   11/09/2020 0930 2020/11/09 0945 November 09, 2020 1000 09-Nov-2020 1015  BP: (!) 106/18 (!) 102/31 (!) 113/36 (!) 119/31  Pulse: 68 69 70 71  Resp: (!) 26 (!) 23 (!) 27 (!) 24  Temp:       TempSrc:      SpO2: 100% 100% 100% 100%  Weight:      Height:        Intake/Output Summary (Last 24 hours) at 11-09-20 1144 Last data filed at 11/09/2020 0754 Gross per 24 hour  Intake 2108.72 ml  Output 766 ml  Net 1342.72 ml   Filed Weights   10/28/20 1745 10/28/20 1900 2020-11-09 0435  Weight: 92.6 kg 93.4 kg 94.4 kg    Examination:  General exam: Appears calm and comfortable  Respiratory system: Clear to auscultation. Respiratory effort normal.  Currently intubated on ventilator.  FiO2 50%. Cardiovascular system: S1 & S2 heard, RRR.  No significant tachycardia noted Gastrointestinal system: Abdomen is nondistended, soft and nontender.  Central nervous system: Unresponsive Extremities: No edema Skin: No rashes, lesions or ulcers Psychiatry: Cannot be assessed    Data Reviewed: I have personally reviewed following  labs and imaging studies  CBC: Recent Labs  Lab 10/25/20 0529 10/26/20 0558 10/27/20 0407 10/28/20 0532 10/28/20 2027 11/20/2020 0831  WBC 16.0* 10.0 8.4 9.7 14.1* 20.2*  NEUTROABS 13.6* 7.8* 5.8 6.4  --  15.2*  HGB 7.6* 8.1* 8.1* 8.7* 8.2* 8.2*  HCT 24.2* 25.3* 24.9* 27.7* 25.8* 25.0*  MCV 92.0 89.7 91.2 91.1 92.8 90.3  PLT 213 237 220 234 187 829   Basic Metabolic Panel: Recent Labs  Lab 10/25/20 0529 10/26/20 0558 10/27/20 0407 10/28/20 0532 10/28/20 2027 2020-11-20 0502  NA 133* 133* 134* 134* 133* 128*  K 4.3 3.7 3.9 4.2 4.5 5.0  CL 94* 94* 94* 95* 95* 87*  CO2 20* $Remov'26 23 22 'lIICWg$ 18* 19*  GLUCOSE 74 93 89 103* 176* 437*  BUN 105* 58* 71* 86* 51* 54*  CREATININE 13.54* 8.42* 10.34* 11.72* 7.57* 8.29*  CALCIUM 6.5* 6.8* 6.8* 6.9* 6.9* 6.5*  MG  --   --   --   --  1.9  --   PHOS 9.1* 5.4* 6.3* 6.9*  --  7.6*   GFR: Estimated Creatinine Clearance: 8.2 mL/min (A) (by C-G formula based on SCr of 8.29 mg/dL (H)). Liver Function Tests: Recent Labs  Lab 10/15/2020 1503 10/24/20 0507 10/26/20 0558 10/27/20 0407 10/28/20 0532 10/28/20 2027  20-Nov-2020 0502  AST 43*  --   --   --   --  38  --   ALT 29  --   --   --   --  32  --   ALKPHOS 75  --   --   --   --  52  --   BILITOT 0.7  --   --   --   --  1.0  --   PROT 7.8  --   --   --   --  6.6  --   ALBUMIN 2.7*   < > 2.0* 2.1* 2.1* 2.2* 1.9*   < > = values in this interval not displayed.   No results for input(s): LIPASE, AMYLASE in the last 168 hours. Recent Labs  Lab 10/28/20 2057  AMMONIA 25   Coagulation Profile: Recent Labs  Lab 10/17/2020 1503  INR 1.2   Cardiac Enzymes: No results for input(s): CKTOTAL, CKMB, CKMBINDEX, TROPONINI in the last 168 hours. BNP (last 3 results) No results for input(s): PROBNP in the last 8760 hours. HbA1C: No results for input(s): HGBA1C in the last 72 hours. CBG: Recent Labs  Lab 10/28/20 1819 10/28/20 2055 2020/11/20 0004 2020-11-20 0359 11-20-2020 0740  GLUCAP 141* 187* 247* 237* 187*   Lipid Profile: Recent Labs    10/28/20 2027  TRIG 177*   Thyroid Function Tests: Recent Labs    10/28/20 2057  TSH 7.523*   Anemia Panel: No results for input(s): VITAMINB12, FOLATE, FERRITIN, TIBC, IRON, RETICCTPCT in the last 72 hours. Sepsis Labs: Recent Labs  Lab 11/07/2020 1503 11/03/2020 1739 10/28/20 2027  LATICACIDVEN 1.2 1.2 2.0*    Recent Results (from the past 240 hour(s))  Urine culture     Status: Abnormal   Collection Time: 10/15/2020  1:44 PM   Specimen: Urine, Catheterized  Result Value Ref Range Status   Specimen Description   Final    URINE, CATHETERIZED Performed at [facility], [address] [place], [place] [address]    Special Requests   Final    NONE Performed at [facility], [address] [place], [place] [address]    Culture >=100,000 COLONIES/mL PSEUDOMONAS AERUGINOSA (  A)  Final   Report Status 10/26/2020 FINAL  Final   Organism ID, Bacteria PSEUDOMONAS AERUGINOSA (A)  Final      Susceptibility   Pseudomonas aeruginosa - MIC*    CEFTAZIDIME 4 SENSITIVE Sensitive     CIPROFLOXACIN <=0.25  SENSITIVE Sensitive     GENTAMICIN <=1 SENSITIVE Sensitive     IMIPENEM 2 SENSITIVE Sensitive     PIP/TAZO 8 SENSITIVE Sensitive     CEFEPIME 2 SENSITIVE Sensitive     * >=100,000 COLONIES/mL PSEUDOMONAS AERUGINOSA  Blood culture (routine single)     Status: None   Collection Time: 11/03/2020  3:03 PM   Specimen: Right Antecubital; Blood  Result Value Ref Range Status   Specimen Description   Final    RIGHT ANTECUBITAL BOTTLES DRAWN AEROBIC AND ANAEROBIC   Special Requests Blood Culture adequate volume  Final   Culture   Final    NO GROWTH 5 DAYS Performed at [facility], [address], [place], [place] [address]    Report Status 10/28/2020 FINAL  Final  Resp Panel by RT-PCR (Flu A&B, Covid) Nasopharyngeal Swab     Status: None   Collection Time: 10/15/2020  3:56 PM   Specimen: Nasopharyngeal Swab; Nasopharyngeal(NP) swabs in vial transport medium  Result Value Ref Range Status   SARS Coronavirus 2 by RT PCR NEGATIVE NEGATIVE Final    Comment: (NOTE) SARS-CoV-2 target nucleic acids are NOT DETECTED.  The SARS-CoV-2 RNA is generally detectable in upper respiratory specimens during the acute phase of infection. The lowest concentration of SARS-CoV-2 viral copies this assay can detect is 138 copies/mL. A negative result does not preclude SARS-Cov-2 infection and should not be used as the sole basis for treatment or other patient management decisions. A negative result may occur with  improper specimen collection/handling, submission of specimen other than nasopharyngeal swab, presence of viral mutation(s) within the areas targeted by this assay, and inadequate number of viral copies(<138 copies/mL). A negative result must be combined with clinical observations, patient history, and epidemiological information. The expected result is Negative.  Fact Sheet for Patients:  EntrepreneurPulse.com.au  Fact Sheet for Healthcare Providers:   IncredibleEmployment.be  This test is no t yet approved or cleared by the [place] FDA and  has been authorized for detection and/or diagnosis of SARS-CoV-2 by FDA under an Emergency Use Authorization (EUA). This EUA will remain  in effect (meaning this test can be used) for the duration of the COVID-19 declaration under Section 564(b)(1) of the Act, 21 U.S.C.section 360bbb-3(b)(1), unless the authorization is terminated  or revoked sooner.       Influenza A by PCR NEGATIVE NEGATIVE Final   Influenza B by PCR NEGATIVE NEGATIVE Final    Comment: (NOTE) The Xpert Xpress SARS-CoV-2/FLU/RSV plus assay is intended as an aid in the diagnosis of influenza from Nasopharyngeal swab specimens and should not be used as a sole basis for treatment. Nasal washings and aspirates are unacceptable for Xpert Xpress SARS-CoV-2/FLU/RSV testing.  Fact Sheet for Patients: EntrepreneurPulse.com.au  Fact Sheet for Healthcare Providers: IncredibleEmployment.be  This test is not yet approved or cleared by the [place] FDA and has been authorized for detection and/or diagnosis of SARS-CoV-2 by FDA under an Emergency Use Authorization (EUA). This EUA will remain in effect (meaning this test can be used) for the duration of the COVID-19 declaration under Section 564(b)(1) of the Act, 21 U.S.C. section 360bbb-3(b)(1), unless the authorization is terminated or revoked.  Performed at [facility], [place]  [address], [place], [place] [address]   MRSA PCR Screening     Status: None   Collection Time: 10/27/2020 10:38 PM   Specimen: Nasal Mucosa; Nasopharyngeal  Result Value Ref Range Status   MRSA by PCR NEGATIVE NEGATIVE Final    Comment:        The GeneXpert MRSA Assay (FDA approved for NASAL specimens only), is one component of a comprehensive MRSA colonization surveillance program. It is not intended to diagnose MRSA infection nor to guide  or monitor treatment for MRSA infections. Performed at [facility], [address] [place], [place] [address]   Resp Panel by RT-PCR (Flu A&B, Covid) Nasopharyngeal Swab     Status: None   Collection Time: 10/28/20  3:20 PM   Specimen: Nasopharyngeal Swab; Nasopharyngeal(NP) swabs in vial transport medium  Result Value Ref Range Status   SARS Coronavirus 2 by RT PCR NEGATIVE NEGATIVE Final    Comment: (NOTE) SARS-CoV-2 target nucleic acids are NOT DETECTED.  The SARS-CoV-2 RNA is generally detectable in upper respiratory specimens during the acute phase of infection. The lowest concentration of SARS-CoV-2 viral copies this assay can detect is 138 copies/mL. A negative result does not preclude SARS-Cov-2 infection and should not be used as the sole basis for treatment or other patient management decisions. A negative result may occur with  improper specimen collection/handling, submission of specimen other than nasopharyngeal swab, presence of viral mutation(s) within the areas targeted by this assay, and inadequate number of viral copies(<138 copies/mL). A negative result must be combined with clinical observations, patient history, and epidemiological information. The expected result is Negative.  Fact Sheet for Patients:  EntrepreneurPulse.com.au  Fact Sheet for Healthcare Providers:  IncredibleEmployment.be  This test is no t yet approved or cleared by the [place] FDA and  has been authorized for detection and/or diagnosis of SARS-CoV-2 by FDA under an Emergency Use Authorization (EUA). This EUA will remain  in effect (meaning this test can be used) for the duration of the COVID-19 declaration under Section 564(b)(1) of the Act, 21 U.S.C.section 360bbb-3(b)(1), unless the authorization is terminated  or revoked sooner.       Influenza A by PCR NEGATIVE NEGATIVE Final   Influenza B by PCR NEGATIVE NEGATIVE Final    Comment:  (NOTE) The Xpert Xpress SARS-CoV-2/FLU/RSV plus assay is intended as an aid in the diagnosis of influenza from Nasopharyngeal swab specimens and should not be used as a sole basis for treatment. Nasal washings and aspirates are unacceptable for Xpert Xpress SARS-CoV-2/FLU/RSV testing.  Fact Sheet for Patients: EntrepreneurPulse.com.au  Fact Sheet for Healthcare Providers: IncredibleEmployment.be  This test is not yet approved or cleared by the [place] FDA and has been authorized for detection and/or diagnosis of SARS-CoV-2 by FDA under an Emergency Use Authorization (EUA). This EUA will remain in effect (meaning this test can be used) for the duration of the COVID-19 declaration under Section 564(b)(1) of the Act, 21 U.S.C. section 360bbb-3(b)(1), unless the authorization is terminated or revoked.  Performed at [facility], [address] [place], [place] [address]   MRSA PCR Screening     Status: None   Collection Time: 10/28/20  6:54 PM   Specimen: Nasal Mucosa; Nasopharyngeal  Result Value Ref Range Status   MRSA by PCR NEGATIVE NEGATIVE Final    Comment:        The GeneXpert MRSA Assay (FDA approved for NASAL specimens only), is one component of a comprehensive MRSA colonization surveillance program. It is not intended  to diagnose MRSA infection nor to guide or monitor treatment for MRSA infections. Performed at [facility], [address] [place], [place] [address]          Radiology Studies: CT HEAD WO CONTRAST  Result Date: 10/28/2020 CLINICAL DATA:  Neuro deficit, acute, stroke suspected Mental status change, unknown cause neuro deficit EXAM: CT HEAD WITHOUT CONTRAST TECHNIQUE: Contiguous axial images were obtained from the base of the skull through the vertex without intravenous contrast. COMPARISON:  None. FINDINGS: Brain: Age related atrophy. Moderate chronic small vessel ischemia. 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 No intracranial hemorrhage, mass effect, or midline shift. No hydrocephalus. The basilar cisterns are patent. No evidence of territorial infarct or acute ischemia. No extra-axial or intracranial fluid collection. Vascular: Atherosclerosis of skullbase vasculature without hyperdense vessel or abnormal calcification. Skull: No fracture or focal lesion. Sinuses/Orbits: Paranasal sinuses and mastoid air cells are clear. The visualized orbits are unremarkable. Other: None. IMPRESSION: 1. No acute intracranial abnormality. 2. 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 Electronically Signed   By: Keith Rake M.D.   On: 10/28/2020 20:51   DG Chest Port 1 View  Result Date: 21-Nov-2020 CLINICAL DATA:  80 year old male with respiratory failure, intubated. Recently negative for COVID-19. EXAM: PORTABLE CHEST 1 VIEW COMPARISON:  Portable chest 10/28/2020 and earlier. FINDINGS: Portable AP upright view at 0504 hours. Endotracheal tube tip in good position between the level the clavicles and carina. Enteric tube courses to the abdomen, tip not included. Stable right chest dual lumen dialysis type catheter. Moderate left veiling opacity in the lower lung compatible with pleural effusion, not significantly changed. No superimposed pneumothorax or pulmonary edema. Probable small right pleural effusion is more apparent. Mediastinal contours remain within normal limits. Paucity of bowel gas in the upper abdomen. Stable visualized osseous structures. IMPRESSION: 1.  Stable lines and tubes. 2. Moderate left pleural effusion not significantly changed. Probable small right pleural effusion which is more apparent. No pulmonary edema. Electronically Signed   By: Genevie Ann M.D.   On: 11/21/20 05:38   DG CHEST PORT 1 VIEW  Result Date: 10/28/2020 CLINICAL DATA:  Hypoxia. EXAM: PORTABLE CHEST 1 VIEW  COMPARISON:  11/11/2020 FINDINGS: Right-sided dialysis catheter remains in place. Endotracheal tube tip 5.2 cm from the carina. Enteric tube in place with tip below the diaphragm not included in the field of view. Heart is upper normal in size. Hazy opacity at the left lung base likely combination of pleural fluid and atelectasis. Mild vascular congestion. No pneumothorax. IMPRESSION: 1. Endotracheal tube tip 5.2 cm from the carina. Enteric tube in place with tip below the diaphragm not included in the field of view. 2. Hazy opacity at the left lung base likely combination of pleural fluid and atelectasis. 3. Mild vascular congestion. Electronically Signed   By: Keith Rake M.D.   On: 10/28/2020 20:19        Scheduled Meds:  chlorhexidine gluconate (MEDLINE KIT)  15 mL Mouth Rinse BID   Chlorhexidine Gluconate Cloth  6 each Topical Daily   docusate  100 mg Per Tube BID   insulin aspart  0-9 Units Subcutaneous TID WC   levothyroxine  175 mcg Per Tube Q0600   mouth rinse  15 mL Mouth Rinse 10 times per day   polyethylene glycol  17 g Per Tube Daily   tamsulosin  0.4 mg Oral QPC  supper   Continuous Infusions:  sodium chloride     sodium chloride     amiodarone 30 mg/hr (11-10-2020 0754)   methocarbamol (ROBAXIN) IV     phenylephrine (NEO-SYNEPHRINE) Adult infusion 200 mcg/min (11-10-2020 1123)   propofol (DIPRIVAN) infusion 15 mcg/kg/min (11/10/20 0754)     LOS: 6 days    Time spent: 45 minutes    Amyra Vantuyl D Manuella Ghazi, DO [facility]  If 7PM-7AM, please contact night-coverage [email or web address] 2020-11-10, 11:44 AM

## 2020-11-13 NOTE — Progress Notes (Signed)
Patient showing declines in status; Dr. Manuella Ghazi notified. Family called to bedside. Chaplain called in by [facility]. Phenylephrine drip stopped due to comfort measures. Patient still on ventilator due to family visiting and not ready to remove ETT.

## 2020-11-13 NOTE — Progress Notes (Signed)
Patient ID: Michael Trevino, male   DOB: 08-09-1941, 80 y.o.   MRN: [id number] S: Afib w/ RVR with hypotension yesterday post HD, net UF around 641cc. Intubated for airway protection. Converted to NSR overnight but hypotension persists. On phenyl, amio, and propofol. Family meeting today. O:BP (!) 119/31   Pulse 71   Temp (!) 95.9 F (35.5 C) (Axillary)   Resp (!) 24   Ht 5' 9" (1.753 m)   Wt 94.4 kg   SpO2 100%   BMI 30.73 kg/m   Intake/Output Summary (Last 24 hours) at 31-Oct-2020 1048 Last data filed at 31-Oct-2020 0754 Gross per 24 hour  Intake 2108.72 ml  Output 766 ml  Net 1342.72 ml   Intake/Output: I/O last 3 completed shifts: In: 629.9 [P.O.:240; I.V.:289.9; IV Piggyback:100] Out: 866 [Urine:225; Other:641]  Intake/Output this shift:  Total I/O In: 1598.9 [I.V.:1598.9] Out: -  Weight change:  Gen: intubated, sedated CVS: RRR Resp: cta, intubated Abd: +BS, soft, NT/ND Ext: no edema, LUE AVF +T/B Neuro: sedated Access: RIJ TDC c/d/i  Recent Labs  Lab 11/03/2020 1503 10/24/20 0507 10/25/20 0529 10/26/20 0558 10/27/20 0407 10/28/20 0532 10/28/20 2027 2020-10-31 0502  NA 132* 131*  131* 133* 133* 134* 134* 133* 128*  K 5.2* 4.3  4.4 4.3 3.7 3.9 4.2 4.5 5.0  CL 92* 94*  94* 94* 94* 94* 95* 95* 87*  CO2 22 19*  19* 20* _0 18* 19*  GLUCOSE 120* 111*  110* 74 93 89 103* 176* 437*  BUN 79* 88*  86* 105* 58* 71* 86* 51* 54*  CREATININE 12.11* 12.81*  12.82* 13.54* 8.42* 10.34* 11.72* 7.57* 8.29*  ALBUMIN 2.7* 2.0* 2.0* 2.0* 2.1* 2.1* 2.2* 1.9*  CALCIUM 7.0* 6.5*  6.5* 6.5* 6.8* 6.8* 6.9* 6.9* 6.5*  PHOS  --  8.3* 9.1* 5.4* 6.3* 6.9*  --  7.6*  AST 43*  --   --   --   --   --  38  --   ALT 29  --   --   --   --   --  32  --    Liver Function Tests: Recent Labs  Lab 11/07/2020 1503 10/24/20 0507 10/28/20 0532 10/28/20 2027 Oct 31, 2020 0502  AST 43*  --   --  38  --   ALT 29  --   --  32  --   ALKPHOS 75  --   --  52  --   BILITOT 0.7  --   --  1.0  --    PROT 7.8  --   --  6.6  --   ALBUMIN 2.7*   < > 2.1* 2.2* 1.9*   < > = values in this interval not displayed.   No results for input(s): LIPASE, AMYLASE in the last 168 hours. Recent Labs  Lab 10/28/20 2057  AMMONIA 25   CBC: Recent Labs  Lab 10/26/20 0558 10/27/20 0407 10/28/20 0532 10/28/20 2027 2020/10/31 0831  WBC 10.0 8.4 9.7 14.1* 20.2*  NEUTROABS 7.8* 5.8 6.4  --  15.2*  HGB 8.1* 8.1* 8.7* 8.2* 8.2*  HCT 25.3* 24.9* 27.7* 25.8* 25.0*  MCV 89.7 91.2 91.1 92.8 90.3  PLT 237 220 234 187 222   Cardiac Enzymes: No results for input(s): CKTOTAL, CKMB, CKMBINDEX, TROPONINI in the last 168 hours. CBG: Recent Labs  Lab 10/28/20 1819 10/28/20 2055 2020/10/31 0004 10-31-20 0359 10-31-20 0740  GLUCAP 141* 187* 247* 237* 187*    Iron Studies: No results for  input(s): IRON, TIBC, TRANSFERRIN, FERRITIN in the last 72 hours. Studies/Results: CT HEAD WO CONTRAST  Result Date: 10/28/2020 CLINICAL DATA:  Neuro deficit, acute, stroke suspected Mental status change, unknown cause neuro deficit EXAM: CT HEAD WITHOUT CONTRAST TECHNIQUE: Contiguous axial images were obtained from the base of the skull through the vertex without intravenous contrast. COMPARISON:  None. FINDINGS: Brain: Age related atrophy. Moderate chronic small vessel ischemia. 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 No intracranial hemorrhage, mass effect, or midline shift. No hydrocephalus. The basilar cisterns are patent. No evidence of territorial infarct or acute ischemia. No extra-axial or intracranial fluid collection. Vascular: Atherosclerosis of skullbase vasculature without hyperdense vessel or abnormal calcification. Skull: No fracture or focal lesion. Sinuses/Orbits: Paranasal sinuses and mastoid air cells are clear. The visualized orbits are unremarkable. Other: None. IMPRESSION: 1. No acute intracranial abnormality. 2. 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 Electronically Signed   By: Keith Rake M.D.   On: 10/28/2020 20:51   DG Chest Port 1 View  Result Date: 2020/11/09 CLINICAL DATA:  80 year old male with respiratory failure, intubated. Recently negative for COVID-19. EXAM: PORTABLE CHEST 1 VIEW COMPARISON:  Portable chest 10/28/2020 and earlier. FINDINGS: Portable AP upright view at 0504 hours. Endotracheal tube tip in good position between the level the clavicles and carina. Enteric tube courses to the abdomen, tip not included. Stable right chest dual lumen dialysis type catheter. Moderate left veiling opacity in the lower lung compatible with pleural effusion, not significantly changed. No superimposed pneumothorax or pulmonary edema. Probable small right pleural effusion is more apparent. Mediastinal contours remain within normal limits. Paucity of bowel gas in the upper abdomen. Stable visualized osseous structures. IMPRESSION: 1.  Stable lines and tubes. 2. Moderate left pleural effusion not significantly changed. Probable small right pleural effusion which is more apparent. No pulmonary edema. Electronically Signed   By: Genevie Ann M.D.   On: Nov 09, 2020 05:38   DG CHEST PORT 1 VIEW  Result Date: 10/28/2020 CLINICAL DATA:  Hypoxia. EXAM: PORTABLE CHEST 1 VIEW COMPARISON:  10/31/2020 FINDINGS: Right-sided dialysis catheter remains in place. Endotracheal tube tip 5.2 cm from the carina. Enteric tube in place with tip below the diaphragm not included in the field of view. Heart is upper normal in size. Hazy opacity at the left lung base likely combination of pleural fluid and atelectasis. Mild vascular congestion. No pneumothorax. IMPRESSION: 1. Endotracheal tube tip 5.2 cm from the carina. Enteric tube in place with tip below the diaphragm not included in the field of view. 2. Hazy opacity at the left lung base likely combination of pleural fluid and atelectasis. 3. Mild  vascular congestion. Electronically Signed   By: Keith Rake M.D.   On: 10/28/2020 20:19   . chlorhexidine gluconate (MEDLINE KIT)  15 mL Mouth Rinse BID  . Chlorhexidine Gluconate Cloth  6 each Topical Daily  . docusate  100 mg Per Tube BID  . insulin aspart  0-9 Units Subcutaneous TID WC  . levothyroxine  175 mcg Per Tube Q0600  . mouth rinse  15 mL Mouth Rinse 10 times per day  . polyethylene glycol  17 g Per Tube Daily  . tamsulosin  0.4 mg Oral QPC supper    BMET    Component Value Date/Time   NA 128 (L) 11/09/20 0502   NA 142 07/12/2020 1209   K 5.0 2020/11/09 0502  CL 87 (L) 11/11/2020 0502   CO2 19 (L) 10/15/2020 0502   GLUCOSE 437 (H) 10/17/2020 0502   BUN 54 (H) 11/08/2020 0502   BUN 54 (H) 07/12/2020 1209   CREATININE 8.29 (H) 10/16/2020 0502   CALCIUM 6.5 (L) 10/27/2020 0502   GFRNONAA 6 (L) 10/28/2020 0502   GFRAA 13 (L) 07/12/2020 1209   CBC    Component Value Date/Time   WBC 20.2 (H) 11/05/2020 0831   RBC 2.77 (L) 11/01/2020 0831   HGB 8.2 (L) 11/05/2020 0831   HGB 10.1 (L) 07/12/2020 1209   HCT 25.0 (L) 10/27/2020 0831   HCT 30.2 (L) 07/12/2020 1209   PLT 222 10/24/2020 0831   PLT 198 07/12/2020 1209   MCV 90.3 11/11/2020 0831   MCV 87 07/12/2020 1209   MCH 29.6 11/02/2020 0831   MCHC 32.8 10/16/2020 0831   RDW 14.1 10/17/2020 0831   RDW 15.1 07/12/2020 1209   LYMPHSABS 1.5 11/05/2020 0831   LYMPHSABS 2.7 07/12/2020 1209   MONOABS 2.5 (H) 11/11/2020 0831   EOSABS 0.0 11/06/2020 0831   EOSABS 0.1 07/12/2020 1209   BASOSABS 0.1 11/04/2020 0831   BASOSABS 0.0 07/12/2020 1209     Dialysis Orders: Center:[facility]. EDW97.5kgHD Bath 2K/2.5CaTime 4 hoursHeparin none. AccessRIJ TDC and maturing LUE AVFBFR 400DFR 600  Epogen1200Units IV/HD   Assessment/Plan: 1. UTI and urinary retention- currently on cefepime and responding.  Culture + for Pseudomonas 2. Acute metabolic encephalopathy versus progression of  dementia: Palliative care/family meeting today 3. [facility]- hemodynamically unstable, family meeting today per primary. If full scope of care then will need to go to [facility] for possible CRRT 4. Hypotension: on pressor support  5. Afib w/ RVR: converted to nsr, cardio on board 6. AHRF: intubated for airway protection 12/16 7. Hypertension/volume- stable 8. Anemia- s/p blood transfusion, will continue ESA 9. Metabolic bone disease- Continue with home meds 10. Nutrition- Renal diet, carb modified 11. DM type 2- per primary svc 12. Right Hip pain- no fracture seen on imaging. 13. CAD with previous NSTEMI- during his hospitalization from 11/18-12/1/21. LHC performed 11/22 demonstrated occluded RCA with grade 3 left to right collaterals, 50 % mid LAD. To follow up as outpatient with cardiology. 14. Disposition- came from SNF and will return when stable versus home hospice depending on family meeting today   , MD [facility] 

## 2020-11-13 NOTE — Care Management Important Message (Signed)
Important Message  Patient Details  Name: Michael Trevino MRN: [id number] Date of Birth: 11-19-1940   Medicare Important Message Given:  Yes     Tommy Medal Oct 31, 2020, 4:23 PM

## 2020-11-13 NOTE — Progress Notes (Signed)
PT Cancellation Note  Patient Details Name: Michael Trevino MRN: [id number] DOB: April 25, 1941   Cancelled Treatment:    Reason Eval/Treat Not Completed: Medical issues which prohibited therapy.  Patient transferred to a higher level of care and will need new PT consult resume therapy when patient is medically stable.  Thank you.   7:34 AM, 2020/11/16 Lonell Grandchild, MPT Physical Therapist with [facility] 336 [phone] office [phone] mobile phone

## 2020-11-13 NOTE — Death Summary Note (Addendum)
Physician Discharge Summary  Keanu Lesniak VWU:[id number] DOB: 1941-01-02 DOA: 01-Nov-2020  PCP: Dettinger, Fransisca Kaufmann, MD  Admit date: November 01, 2020  Death date: 07-Nov-2020 1915  Admitted From: SNF  Disposition:  Expired  Brief/Interim Summary: 80 y.o.male,malewith past medical history relevant for HTN, DM2,nephrotic syndrome,ESRD, started hemodialysis last month, hypothyroidism, CADwith recent admission due toNSTEMI, and HLD and obesity. -Patient with recent hospitalization at [facility], where hemodialysis was initiated, where he was discharged to SNF, patient was brought by the facility due to fever, and hip pain, altered mental status, initially patient was altered per ED physician, but this has improved upon my evaluation, son was at bedside helps with the history, patient with fever 101.2 per EMS, for which she received Tylenol, son report patient transfused 2 units PRBC on Thursday, so he missed dialysis that day, so he was dialyzed on Friday, he was supposed to be dialyzed today as well,as well patient with complaints of right hip pain for last 2 to 3 days. - in EDpatient was noticed to be with urinary retention, 1200 cc of foul-smelling purulent urine drained after Foley catheter insertion, work-up significant for potassium of 5.2, creatinine of 12.1, white blood cell count of 27.8, but normal lactic acid, hemoglobin at 9.8, chest x-ray with no acute findings, UA with elevated leukocytes, but no pyuria, but hip x-ray with no evidence of fracture, [facility] hospitalist consulted to admit.  -Patient was admitted for acute metabolic encephalopathy superimposed on progressive dementia with delirium in the setting of Pseudomonas UTI with acute urinary retention.  He was going to be discharged to SNF on 12/16 after hemodialysis, but unfortunately became unresponsive with tachycardia and hypotension noted.  He was sent down to stepdown unit and fluid boluses were attempted, but without  improvement.  PCCM via E-link became involved and had recommended intubation due to for airway protection as well as head CT and other labs.  Head CT with no acute findings noted.  Patient was intubated and was noted to be in atrial fibrillation with RVR for which amiodarone was initiated.  He is also on phenylephrine for blood pressure support.  Discussion with spouse and son at bedside who had on 11/08/2023.  They are like for him to be DNR and comfort care.  Plans were made for terminal extubation by 12/19, however blood pressures began to diminish greatly and pulse became weak and thready.  He was then noted to lose his pulse and was pronounced at 1915 on 11-07-2020.  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  New onset atrial fibrillation with RVR and hypothyroidism.  Severe sepsis secondary to Pseudomonas UTI.  Discharge Instructions  Discharge Instructions    Ambulatory referral to Urology   Complete by: As directed    Hospital follow up for uti/urinary retention with foley catheter.   Diet - low sodium heart healthy   Complete by: As directed    Discharge wound care:   Complete by: As directed    Daily dressing changes.   Increase activity slowly   Complete by: As directed      Allergies as of 11-07-2020  Reactions   Zocor [simvastatin - High Dose] Nausea Only   Unknown      Medication List    TAKE these medications   acetaminophen 325 MG tablet Commonly known as: TYLENOL Take 2 tablets (650 mg total) by mouth every 4 (four) hours as needed for headache or mild pain.    amLODipine 10 MG tablet Commonly known as: NORVASC TAKE 1 TABLET BY MOUTH EVERY DAY   ascorbic acid 500 MG tablet Commonly known as: VITAMIN C Take 500 mg by mouth daily.   aspirin 81 MG chewable tablet Chew 1 tablet (81 mg total) by mouth daily.   atorvastatin 80 MG tablet Commonly known as: LIPITOR Take 1 tablet (80 mg total) by mouth daily. What changed: Another medication with the same name was added. Make sure you understand how and when to take each.   atorvastatin 40 MG tablet Commonly known as: LIPITOR TAKE 1 TABLET BY MOUTH EVERY DAY  (Needs to be seen before next refill) What changed: You were already taking a medication with the same name, and this prescription was added. Make sure you understand how and when to take each.   bisacodyl 10 MG suppository Commonly known as: DULCOLAX Place 1 suppository (10 mg total) rectally daily as needed for moderate constipation.   calcitRIOL 0.5 MCG capsule Commonly known as: ROCALTROL Take 0.5 mcg by mouth daily.   Calcium Carbonate 500 MG Chew Chew 1 tablet (500 mg total) by mouth 3 (three) times daily as needed. What changed: reasons to take this   cholecalciferol 25 MCG (1000 UNIT) tablet Commonly known as: VITAMIN D3 Take 1,000 Units by mouth daily.   Darbepoetin Alfa 40 MCG/0.4ML Sosy injection Commonly known as: ARANESP Inject 0.4 mLs (40 mcg total) into the vein every Thursday with hemodialysis.   feeding supplement (NEPRO CARB STEADY) Liqd Take 237 mLs by mouth 2 (two) times daily between meals.   glucose blood test strip Use to check blood glucose once daily.  Dx:  E11.9   hydrALAZINE 50 MG tablet Commonly known as: APRESOLINE Take 1 tablet (50 mg total) by mouth 3 (three) times daily.   insulin detemir 100 UNIT/ML FlexPen Commonly known as: Levemir FlexPen Inject 25 Units into the skin at bedtime.   levothyroxine 175 MCG tablet Commonly known as: SYNTHROID TAKE 1 TABLET BY MOUTH DAILY BEFORE  BREAKFAST. What changed: See the new instructions.   metoprolol tartrate 100 MG tablet Commonly known as: LOPRESSOR Take 1 tablet (100 mg total) by mouth 2 (two) times daily.   multivitamin Tabs tablet Take 1 tablet by mouth at bedtime.   nitroGLYCERIN 0.4 MG SL tablet Commonly known as: NITROSTAT Place 1 tablet (0.4 mg total) under the tongue every 5 (five) minutes as needed for chest pain.   oxyCODONE-acetaminophen 5-325 MG tablet Commonly known as: Percocet Take 1 tablet by mouth every 4 (four) hours as needed for severe pain.   tamsulosin 0.4 MG Caps capsule Commonly known as: FLOMAX Take 1 capsule (0.4 mg total) by mouth daily after supper.            Discharge Care Instructions  (From admission, onward)         Start     Ordered   10/28/20 0000  Discharge wound care:       Comments: Daily dressing changes.   10/28/20 1334          Contact information for follow-up providers    [facility].  Specialty: Hospice Why:  Referral Made to Palliative services, They will see patient next week at [facility]. Contact information: [address] [place] [phone]       Dettinger, Fransisca Kaufmann, MD Follow up in 1 week(s).   Specialties: Family Medicine, Cardiology Contact information: [place] [address] [phone]            Contact information for after-discharge care    Destination    [facility] .   Service: Skilled Nursing Contact information: [address] [place] [phone]                 Allergies  Allergen Reactions  . Zocor [Simvastatin - High Dose] Nausea Only    Unknown    Consultations:  Palliative care  Nephrology  Cardiology   Procedures/Studies: CT ABDOMEN PELVIS WO CONTRAST  Result Date: 11/01/2020 CLINICAL DATA:  Sepsis, unknown source, UTI and urinary  retention. Fever and right hip pain. EXAM: CT ABDOMEN AND PELVIS WITHOUT CONTRAST TECHNIQUE: Multidetector CT imaging of the abdomen and pelvis was performed following the standard protocol without IV contrast. COMPARISON:  None. FINDINGS: Markedly limited evaluation due to respiratory motion artifact. Lower chest: At least small to moderate volume left pleural effusion. Visualized portions of the left lower lobes are collapsed. The left ventricle is prominent in size. Hepatobiliary: There is a hypodense 4.7 cm lesion within the left hepatic lobe. The remainder of the a patent parenchyma is grossly unremarkable on this noncontrast study. No CT evidence of calcified gallstones. The gallbladder is grossly unremarkable on this noncontrast study. No biliary ductal dilatation. Pancreas: No focal lesion. Normal pancreatic contour. No surrounding inflammatory changes. No main pancreatic ductal dilatation. Spleen: Normal in size without focal abnormality. Adrenals/Urinary Tract: No adrenal nodule bilaterally. Bilateral renal cortical scarring. At least mild bilateral, right greater than left, hydroureteronephrosis. No nephrolithiasis and no contour-deforming renal mass. No ureterolithiasis. The urinary bladder is decompressed with a Foley catheter terminating within its lumen. Urinary bladder walls appear thickened. Perivesicular fat stranding. Stomach/Bowel: Stomach is within normal limits. No evidence of bowel wall thickening or dilatation. Diffuse descending colon and sigmoid diverticulosis. The appendix is normal in caliber. Vascular/Lymphatic: No abdominal aorta or iliac aneurysm. Moderate to severe atherosclerotic plaque of the aorta and its branches. No abdominal, pelvic, or inguinal lymphadenopathy. Reproductive: The prostate is enlarged in size measuring up to 5.2 cm. Other: Diffuse mild haziness of the mesentery likely due to motion. No intraperitoneal free fluid. No intraperitoneal free gas. No organized fluid  collection. Musculoskeletal: Mild subcutaneus soft tissue edema. Slightly asymmetrical and hypodensity of the left flank abdominal wall musculature with overlying 6.9 x 2.3 cm slightly higher than simple fluid density suggestive of possible hematoma formation. Soft tissue density within the anterior subcutaneus soft tissues likely related to medication injections. Possible tiny right fat  inguinal hernia. No suspicious lytic or blastic osseous lesions. No cortical erosion or destruction. No acute displaced fracture. Multilevel degenerative changes of the spine. At least mild bilateral hip degenerative changes. IMPRESSION: 1. Limited evaluation due to respiratory motion artifact and noncontrast study. 2. Collecting system and urinary bladder findings consistent with known infection and urinary retention in the setting of prostatomegaly. 3. Slightly asymmetric and hypodense left flank abdominal wall musculature with overlying 6.9 x 2.3 cm slightly higher than simple fluid density suggestive of possible hematoma formation. No organized fluid collection, though limited evaluation on this noncontrast study.  Superimposed infection is not excluded. 4. Mild to moderate size left pleural effusion that is incompletely visualized. 5. Indeterminate 4.7 cm left hepatic lobe lesion. 6. Other imaging findings of potential clinical significance: Prominent, likely enlarged, left ventricle. Possible tiny right fat inguinal hernia. Aortic Atherosclerosis (ICD10-I70.0). Electronically Signed   By: Iven Finn M.D.   On: 11/08/2020 17:40   CT HEAD WO CONTRAST  Result Date: 10/28/2020 CLINICAL DATA:  Neuro deficit, acute, stroke suspected Mental status change, unknown cause neuro deficit EXAM: CT HEAD WITHOUT CONTRAST TECHNIQUE: Contiguous axial images were obtained from the base of the skull through the vertex without intravenous contrast. COMPARISON:  None. FINDINGS: Brain: Age related atrophy. Moderate chronic small vessel  ischemia. 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 No intracranial hemorrhage, mass effect, or midline shift. No hydrocephalus. The basilar cisterns are patent. No evidence of territorial infarct or acute ischemia. No extra-axial or intracranial fluid collection. Vascular: Atherosclerosis of skullbase vasculature without hyperdense vessel or abnormal calcification. Skull: No fracture or focal lesion. Sinuses/Orbits: Paranasal sinuses and mastoid air cells are clear. The visualized orbits are unremarkable. Other: None. IMPRESSION: 1. No acute intracranial abnormality. 2. 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 Electronically Signed   By: Keith Rake M.D.   On: 10/28/2020 20:51   CARDIAC CATHETERIZATION  Result Date: 10/04/2020  Ost RCA to Prox RCA lesion is 100% stenosed.  Mid LAD lesion is 50% stenosed.  Makai Dumond is a 80 y.o. male  [id number] LOCATION:  FACILITY: [facility] PHYSICIAN: Quay Burow, M.D. December 10, 1940 DATE OF PROCEDURE:  10/04/2020 DATE OF DISCHARGE: CARDIAC CATHETERIZATION History obtained from chart review.80 y.o. male with known CAD admitted with NSTEMI. EF remains normal on TTE this admission.  He does have moderate AI.  He has a temporary dialysis catheter in place and will ultimately need an AV fistula placed for long-term hemodialysis.  He was referred for diagnostic coronary angiography because of chest pain shortness of breath and positive enzymes.   Mr. Ballweg  has an occluded RCA (CTO) with grade 3 left-to-right collaterals.  He has a 50% mid LAD lesion.  His LVEDP was only mildly elevated at 19.  He was admitted with chest pain and shortness of breath.  His BNP was elevated and his enzymes peaked at about 3000.  I believe this was demand ischemia from volume overload and collateral insufficiency.  Medical therapy will be recommended.  He will need an AV  fistula placed once he is hemodynamically stable.  A right common femoral angiogram was performed and a Mynx closure device was successfully deployed achieving hemostasis.  The patient left lab in stable condition. Quay Burow. MD, [facility] 10/04/2020 8:38 AM   IR Fluoro Guide CV Line Right  Result Date: 10/01/2020 INDICATION: END-STAGE RENAL DISEASE, NO CURRENT ACCESS FOR DIALYSIS EXAM: ULTRASOUND FLUOROSCOPIC RIGHT IJ TEMPORARY DIALYSIS CATHETER [facility] CATHETER) MEDICATIONS: 1% lidocaine local ANESTHESIA/SEDATION: None. FLUOROSCOPY TIME:  Fluoroscopy Time: 0 minutes 36 seconds (29.8 mGy). COMPLICATIONS: None immediate. PROCEDURE: Informed written consent was obtained from the patient after a thorough discussion of the procedural risks, benefits and alternatives. All questions were addressed. Maximal Sterile Barrier Technique was utilized including caps, mask, sterile gowns, sterile gloves, sterile drape, hand hygiene and skin antiseptic. A timeout was performed prior to the initiation of the procedure. Under sterile conditions and local anesthesia, ultrasound micropuncture access performed of the right internal jugular vein. Images  obtained for documentation of the patent right internal jugular vein. Micro dilator advanced. Amplatz guidewire inserted. Tract dilatation performed to insert a 20 cm her catheter. Tip position SVC RA junction. Images obtained for documentation. Access secured with prolene sutures. Blood aspirated easily followed by saline and heparin flushes. Appropriate volume and strength of heparin instilled in all 3 lumens followed by external caps and a sterile dressing. No immediate complication. Patient tolerated the procedure well. IMPRESSION: Successful ultrasound and fluoroscopic right IJ temporary dialysis catheter (Mahurkar catheter). Access ready for use. Electronically Signed   By: Jerilynn Mages.  Shick M.D.   On: 10/01/2020 16:04   IR US Guide Vasc Access Right  Result Date:  10/01/2020 INDICATION: END-STAGE RENAL DISEASE, NO CURRENT ACCESS FOR DIALYSIS EXAM: ULTRASOUND FLUOROSCOPIC RIGHT IJ TEMPORARY DIALYSIS CATHETER [facility] CATHETER) MEDICATIONS: 1% lidocaine local ANESTHESIA/SEDATION: None. FLUOROSCOPY TIME:  Fluoroscopy Time: 0 minutes 36 seconds (29.8 mGy). COMPLICATIONS: None immediate. PROCEDURE: Informed written consent was obtained from the patient after a thorough discussion of the procedural risks, benefits and alternatives. All questions were addressed. Maximal Sterile Barrier Technique was utilized including caps, mask, sterile gowns, sterile gloves, sterile drape, hand hygiene and skin antiseptic. A timeout was performed prior to the initiation of the procedure. Under sterile conditions and local anesthesia, ultrasound micropuncture access performed of the right internal jugular vein. Images obtained for documentation of the patent right internal jugular vein. Micro dilator advanced. Amplatz guidewire inserted. Tract dilatation performed to insert a 20 cm her catheter. Tip position SVC RA junction. Images obtained for documentation. Access secured with prolene sutures. Blood aspirated easily followed by saline and heparin flushes. Appropriate volume and strength of heparin instilled in all 3 lumens followed by external caps and a sterile dressing. No immediate complication. Patient tolerated the procedure well. IMPRESSION: Successful ultrasound and fluoroscopic right IJ temporary dialysis catheter (Mahurkar catheter). Access ready for use. Electronically Signed   By: Jerilynn Mages.  Shick M.D.   On: 10/01/2020 16:04   DG Chest Port 1 View  Result Date: 11/06/2020 CLINICAL DATA:  80 year old male with respiratory failure, intubated. Recently negative for COVID-19. EXAM: PORTABLE CHEST 1 VIEW COMPARISON:  Portable chest 10/28/2020 and earlier. FINDINGS: Portable AP upright view at 0504 hours. Endotracheal tube tip in good position between the level the clavicles and carina.  Enteric tube courses to the abdomen, tip not included. Stable right chest dual lumen dialysis type catheter. Moderate left veiling opacity in the lower lung compatible with pleural effusion, not significantly changed. No superimposed pneumothorax or pulmonary edema. Probable small right pleural effusion is more apparent. Mediastinal contours remain within normal limits. Paucity of bowel gas in the upper abdomen. Stable visualized osseous structures. IMPRESSION: 1.  Stable lines and tubes. 2. Moderate left pleural effusion not significantly changed. Probable small right pleural effusion which is more apparent. No pulmonary edema. Electronically Signed   By: Genevie Ann M.D.   On: November 06, 2020 05:38   DG CHEST PORT 1 VIEW  Result Date: 10/28/2020 CLINICAL DATA:  Hypoxia. EXAM: PORTABLE CHEST 1 VIEW COMPARISON:  10/21/2020 FINDINGS: Right-sided dialysis catheter remains in place. Endotracheal tube tip 5.2 cm from the carina. Enteric tube in place with tip below the diaphragm not included in the field of view. Heart is upper normal in size. Hazy opacity at the left lung base likely combination of pleural fluid and atelectasis. Mild vascular congestion. No pneumothorax. IMPRESSION: 1. Endotracheal tube tip 5.2 cm from the carina. Enteric tube in place with tip below the diaphragm not included in  the field of view. 2. Hazy opacity at the left lung base likely combination of pleural fluid and atelectasis. 3. Mild vascular congestion. Electronically Signed   By: Keith Rake M.D.   On: 10/28/2020 20:19   DG Chest Port 1 View  Result Date: 11/06/2020 CLINICAL DATA:  79 year old male with questionable sepsis. EXAM: PORTABLE CHEST 1 VIEW COMPARISON:  10/05/2020 FINDINGS: Unchanged cardiomegaly. Similar appearance of indwelling right IJ tunneled hemodialysis catheter with the catheter tip in the superior vena cava. Interval improved aeration of the lungs bilaterally. Probable trace left pleural effusion. No new focal  consolidations or pneumothorax. No acute osseous abnormality. IMPRESSION: No airspace opacities that would be concerning for pneumonia. Interval improved fluid status with decreased pulmonary edema and decreased size of probable trace left pleural effusion. Unchanged cardiomegaly. Electronically Signed   By: Ruthann Cancer MD   On: 11/04/2020 14:31   DG Chest Port 1 View  Result Date: 10/05/2020 CLINICAL DATA:  Dialysis catheter insertion EXAM: PORTABLE CHEST 1 VIEW COMPARISON:  09/30/2020 FINDINGS: Right-sided central venous catheter tip over the SVC. No pneumothorax. Cardiomegaly with vascular congestion and hazy pulmonary edema. At least moderate likely layering left pleural effusion with small right pleural effusion. Dense consolidation left lung base. Aortic atherosclerosis. IMPRESSION: 1. Right-sided central venous catheter tip over the SVC. No pneumothorax. 2. Cardiomegaly with vascular congestion and hazy pulmonary edema. Bilateral pleural effusions, at least moderate on the left and probably increased. Small right pleural effusion. Worsening aeration of left base. Electronically Signed   By: Donavan Foil M.D.   On: 10/05/2020 17:00   DG Chest Portable 1 View  Result Date: 09/30/2020 CLINICAL DATA:  Status post thoracentesis. EXAM: PORTABLE CHEST 1 VIEW COMPARISON:  Earlier today at 6:49 a.m. FINDINGS: 11:19 a.m. Midline trachea. Mild cardiomegaly. Atherosclerosis in the transverse aorta. Decrease in small left pleural effusion. Probable tiny layering right pleural effusion. No pneumothorax. Mild congestive heart failure, improved. Persistent right and improved left base airspace disease. IMPRESSION: Decreased sided pleural effusion, without pneumothorax. Improved mild interstitial edema with left greater than right small bilateral pleural effusions and adjacent Airspace disease, likely atelectasis. Aortic Atherosclerosis (ICD10-I70.0). Electronically Signed   By: Abigail Miyamoto M.D.   On: 09/30/2020  11:29   DG Fluoro Guide CV Line-No Report  Result Date: 10/05/2020 Fluoroscopy was utilized by the requesting physician.  No radiographic interpretation.   ECHOCARDIOGRAM COMPLETE  Result Date: 09/30/2020    ECHOCARDIOGRAM REPORT   Patient Name:   BEVERLY SURIANO Date of Exam: 09/30/2020 Medical Rec #:  [id number]    Height:       69.0 in Accession #:    [id number]   Weight:       240.0 lb Date of Birth:  Mar 31, 1941    BSA:          2.232 m Patient Age:    21 years     BP:           167/58 mmHg Patient Gender: M            HR:           74 bpm. Exam Location:  [facility] Procedure: 2D Echo Indications:     Dyspnea 786.09 / R06.00  History:         Patient has prior history of Echocardiogram examinations, most                  recent 09/24/2019. CHF; Risk Factors:Former Smoker,  Dyslipidemia, Hypertension and Diabetes. Nonrheumatic aortic                  valve insufficiency.  Sonographer:     Leavy Cella RDCS (AE) Referring Phys:  [id number] Arnoldo Lenis Diagnosing Phys: Carlyle Dolly MD IMPRESSIONS  1. Left ventricular ejection fraction, by estimation, is 55 to 60%. The left ventricle has normal function. The left ventricle has no regional wall motion abnormalities. There is severe left ventricular hypertrophy. Left ventricular diastolic parameters  are indeterminate. Elevated left atrial pressure.  2. Right ventricular systolic function is normal. The right ventricular size is normal.  3. A small pericardial effusion is present. The pericardial effusion is circumferential. There is no evidence of cardiac tamponade. Large pleural effusion in the left lateral region.  4. The mitral valve is normal in structure. Trivial mitral valve regurgitation. No evidence of mitral stenosis.  5. The aortic valve was not well visualized. There is mild calcification of the aortic valve. There is mild thickening of the aortic valve. Aortic valve regurgitation is moderate. Moderate aortic valve stenosis.   6. The inferior vena cava is normal in size with greater than 50% respiratory variability, suggesting right atrial pressure of 3 mmHg. FINDINGS  Left Ventricle: Left ventricular ejection fraction, by estimation, is 55 to 60%. The left ventricle has normal function. The left ventricle has no regional wall motion abnormalities. The left ventricular internal cavity size was normal in size. There is  severe left ventricular hypertrophy. Left ventricular diastolic parameters are indeterminate. Elevated left atrial pressure. Right Ventricle: The right ventricular size is normal. No increase in right ventricular wall thickness. Right ventricular systolic function is normal. Left Atrium: Left atrial size was normal in size. Right Atrium: Right atrial size was normal in size. Pericardium: A small pericardial effusion is present. The pericardial effusion is circumferential. There is no evidence of cardiac tamponade. Mitral Valve: The mitral valve is normal in structure. There is mild thickening of the mitral valve leaflet(s). There is mild calcification of the mitral valve leaflet(s). Mild mitral annular calcification. Trivial mitral valve regurgitation. No evidence  of mitral valve stenosis. Tricuspid Valve: The tricuspid valve is not well visualized. Tricuspid valve regurgitation is mild . No evidence of tricuspid stenosis. Aortic Valve: The aortic valve was not well visualized. There is mild calcification of the aortic valve. There is mild thickening of the aortic valve. There is mild aortic valve annular calcification. Aortic valve regurgitation is moderate. Aortic regurgitation PHT measures 349 msec. Moderate aortic stenosis is present. Aortic valve mean gradient measures 19.4 mmHg. Aortic valve peak gradient measures 34.7 mmHg. Aortic valve area, by VTI measures 1.30 cm. Pulmonic Valve: The pulmonic valve was not well visualized. Pulmonic valve regurgitation is not visualized. No evidence of pulmonic stenosis. Aorta:  The aortic root is normal in size and structure. Pulmonary Artery: Indeterminate PASP, inadequate TR jet. Venous: The inferior vena cava is normal in size with greater than 50% respiratory variability, suggesting right atrial pressure of 3 mmHg. IAS/Shunts: No atrial level shunt detected by color flow Doppler. Additional Comments: There is a large pleural effusion in the left lateral region.  LEFT VENTRICLE PLAX 2D LVIDd:         5.34 cm  Diastology LVIDs:         2.37 cm  LV e' lateral:   3.59 cm/s LV PW:         2.03 cm  LV E/e' lateral: 34.3 LV IVS:  1.69 cm LVOT diam:     2.10 cm LV SV:         87 LV SV Index:   39 LVOT Area:     3.46 cm  RIGHT VENTRICLE RV S prime:     14.20 cm/s TAPSE (M-mode): 2.2 cm LEFT ATRIUM             Index       RIGHT ATRIUM          Index LA diam:        3.40 cm 1.52 cm/m  RA Area:     8.96 cm LA Vol (A2C):   70.3 ml 31.49 ml/m RA Volume:   17.80 ml 7.97 ml/m LA Vol (A4C):   47.8 ml 21.41 ml/m LA Biplane Vol: 58.7 ml 26.29 ml/m  AORTIC VALVE AV Area (Vmax):    1.29 cm AV Area (Vmean):   1.24 cm AV Area (VTI):     1.30 cm AV Vmax:           294.66 cm/s AV Vmean:          207.077 cm/s AV VTI:            0.669 m AV Peak Grad:      34.7 mmHg AV Mean Grad:      19.4 mmHg LVOT Vmax:         109.33 cm/s LVOT Vmean:        73.863 cm/s LVOT VTI:          0.252 m LVOT/AV VTI ratio: 0.38 AI PHT:            349 msec  AORTA Ao Root diam: 3.10 cm MITRAL VALVE MV Area (PHT): 3.23 cm     SHUNTS MV Decel Time: 235 msec     Systemic VTI:  0.25 m MV E velocity: 123.00 cm/s  Systemic Diam: 2.10 cm MV A velocity: 101.00 cm/s MV E/A ratio:  1.22 Carlyle Dolly MD Electronically signed by Carlyle Dolly MD Signature Date/Time: 09/30/2020/12:04:11 PM    Final (Updated)    DG Hip Unilat W or Wo Pelvis 2-3 Views Right  Result Date: 11/09/2020 CLINICAL DATA:  80 year old male with right hip pain status post fall yesterday. EXAM: DG HIP (WITH OR WITHOUT PELVIS) 2-3V RIGHT COMPARISON:   None. FINDINGS: There is no evidence of hip fracture or dislocation. 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 Degenerative changes of the lower lumbar spine soft tissues are unremarkable. IMPRESSION: 1. No acute fracture or malalignment. 2. Symmetric fraying of pelvic entheses as could be seen with renal osteodystrophy. Electronically Signed   By: Ruthann Cancer MD   On: 10/19/2020 14:59   US THORACENTESIS ASP PLEURAL SPACE W/IMG GUIDE  Result Date: 09/30/2020 INDICATION: Left pleural effusion CHF EXAM: ULTRASOUND GUIDED LEFT THORACENTESIS MEDICATIONS: 10 cc 1% lidocaine. COMPLICATIONS: None immediate. PROCEDURE: An ultrasound guided thoracentesis was thoroughly discussed with the patient and questions answered. The benefits, risks, alternatives and complications were also discussed. The patient understands and wishes to proceed with the procedure. Written consent was obtained. Ultrasound was performed to localize and mark an adequate pocket of fluid in the left chest. The area was then prepped and draped in the normal sterile fashion. 1% Lidocaine was used for local anesthesia. Under ultrasound guidance a 19 G Yueh catheter was introduced. Thoracentesis was performed. The catheter was removed and a dressing  applied. FINDINGS: A total of approximately 1 liter of blood tinged fluid was removed. Samples were sent to the laboratory as requested by the clinical team. IMPRESSION: Successful ultrasound guided left thoracentesis yielding 1 liter of pleural fluid. No PTX post procedure CXR Read by [facility] Electronically Signed   By: Abigail Miyamoto M.D.   On: 09/30/2020 11:30      Discharge Exam: Vitals:   15-Nov-2020 1800 11/15/20 1845  BP:  (!) 73/44  Pulse: 80 78  Resp: (!) 26 (!) 24  Temp:    SpO2: 94% 93%   Vitals:    15-Nov-2020 1700 11-15-2020 1800 2020/11/15 1845 11-15-2020 1949  BP: (!) 98/23  (!) 73/44   Pulse: 85 80 78   Resp: (!) 22 (!) 26 (!) 24   Temp:      TempSrc:      SpO2: 99% 94% 93%   Weight:    94.4 kg  Height:    5\' 9"  (1.753 m)    The results of significant diagnostics from this hospitalization (including imaging, microbiology, ancillary and laboratory) are listed below for reference.     Microbiology: Recent Results (from the past 240 hour(s))  Urine culture     Status: Abnormal   Collection Time: 10/22/2020  1:44 PM   Specimen: Urine, Catheterized  Result Value Ref Range Status   Specimen Description   Final    URINE, CATHETERIZED Performed at [facility], [address] [place], [place] [address]    Special Requests   Final    NONE Performed at [facility], [address] [place], [place] [address]    Culture >=100,000 COLONIES/mL PSEUDOMONAS AERUGINOSA (A)  Final   Report Status 10/26/2020 FINAL  Final   Organism ID, Bacteria PSEUDOMONAS AERUGINOSA (A)  Final      Susceptibility   Pseudomonas aeruginosa - MIC*    CEFTAZIDIME 4 SENSITIVE Sensitive     CIPROFLOXACIN <=0.25 SENSITIVE Sensitive     GENTAMICIN <=1 SENSITIVE Sensitive     IMIPENEM 2 SENSITIVE Sensitive     PIP/TAZO 8 SENSITIVE Sensitive     CEFEPIME 2 SENSITIVE Sensitive     * >=100,000 COLONIES/mL PSEUDOMONAS AERUGINOSA  Blood culture (routine single)     Status: None   Collection Time: 11/08/2020  3:03 PM   Specimen: Right Antecubital; Blood  Result Value Ref Range Status   Specimen Description   Final    RIGHT ANTECUBITAL BOTTLES DRAWN AEROBIC AND ANAEROBIC   Special Requests Blood Culture adequate volume  Final   Culture   Final    NO GROWTH 5 DAYS Performed at [facility], [address], [place], [place] [address]    Report Status 10/28/2020 FINAL  Final  Resp Panel by RT-PCR (Flu A&B, Covid) Nasopharyngeal Swab     Status: None   Collection Time: 10/20/2020  3:56 PM   Specimen: Nasopharyngeal Swab;  Nasopharyngeal(NP) swabs in vial transport medium  Result Value Ref Range Status   SARS Coronavirus 2 by RT PCR NEGATIVE NEGATIVE Final    Comment: (NOTE) SARS-CoV-2 target nucleic acids are NOT DETECTED.  The SARS-CoV-2 RNA is generally detectable in upper respiratory specimens during the acute phase of infection. The lowest concentration of SARS-CoV-2 viral copies this assay can detect is 138 copies/mL. A negative result does not preclude SARS-Cov-2 infection and should not be used as the sole basis for treatment or other patient management decisions. A negative result may occur with  improper specimen collection/handling, submission of specimen  other than nasopharyngeal swab, presence of viral mutation(s) within the areas targeted by this assay, and inadequate number of viral copies(<138 copies/mL). A negative result must be combined with clinical observations, patient history, and epidemiological information. The expected result is Negative.  Fact Sheet for Patients:  EntrepreneurPulse.com.au  Fact Sheet for Healthcare Providers:  IncredibleEmployment.be  This test is no t yet approved or cleared by the [place] FDA and  has been authorized for detection and/or diagnosis of SARS-CoV-2 by FDA under an Emergency Use Authorization (EUA). This EUA will remain  in effect (meaning this test can be used) for the duration of the COVID-19 declaration under Section 564(b)(1) of the Act, 21 U.S.C.section 360bbb-3(b)(1), unless the authorization is terminated  or revoked sooner.       Influenza A by PCR NEGATIVE NEGATIVE Final   Influenza B by PCR NEGATIVE NEGATIVE Final    Comment: (NOTE) The Xpert Xpress SARS-CoV-2/FLU/RSV plus assay is intended as an aid in the diagnosis of influenza from Nasopharyngeal swab specimens and should not be used as a sole basis for treatment. Nasal washings and aspirates are unacceptable for Xpert Xpress  SARS-CoV-2/FLU/RSV testing.  Fact Sheet for Patients: EntrepreneurPulse.com.au  Fact Sheet for Healthcare Providers: IncredibleEmployment.be  This test is not yet approved or cleared by the [place] FDA and has been authorized for detection and/or diagnosis of SARS-CoV-2 by FDA under an Emergency Use Authorization (EUA). This EUA will remain in effect (meaning this test can be used) for the duration of the COVID-19 declaration under Section 564(b)(1) of the Act, 21 U.S.C. section 360bbb-3(b)(1), unless the authorization is terminated or revoked.  Performed at [facility], [address] [place], [place] [address]   MRSA PCR Screening     Status: None   Collection Time: 10/22/2020 10:38 PM   Specimen: Nasal Mucosa; Nasopharyngeal  Result Value Ref Range Status   MRSA by PCR NEGATIVE NEGATIVE Final    Comment:        The GeneXpert MRSA Assay (FDA approved for NASAL specimens only), is one component of a comprehensive MRSA colonization surveillance program. It is not intended to diagnose MRSA infection nor to guide or monitor treatment for MRSA infections. Performed at [facility], [address] [place],  [address]   Resp Panel by RT-PCR (Flu A&B, Covid) Nasopharyngeal Swab     Status: None   Collection Time: 10/28/20  3:20 PM   Specimen: Nasopharyngeal Swab; Nasopharyngeal(NP) swabs in vial transport medium  Result Value Ref Range Status   SARS Coronavirus 2 by RT PCR NEGATIVE NEGATIVE Final    Comment: (NOTE) SARS-CoV-2 target nucleic acids are NOT DETECTED.  The SARS-CoV-2 RNA is generally detectable in upper respiratory specimens during the acute phase of infection. The lowest concentration of SARS-CoV-2 viral copies this assay can detect is 138 copies/mL. A negative result does not preclude SARS-Cov-2 infection and should not be used as the sole basis for treatment or other patient management decisions. A negative  result may occur with  improper specimen collection/handling, submission of specimen other than nasopharyngeal swab, presence of viral mutation(s) within the areas targeted by this assay, and inadequate number of viral copies(<138 copies/mL). A negative result must be combined with clinical observations, patient history, and epidemiological information. The expected result is Negative.  Fact Sheet for Patients:  EntrepreneurPulse.com.au  Fact Sheet for Healthcare Providers:  IncredibleEmployment.be  This test is no t yet approved or cleared by the [place] FDA and  has been authorized for detection  and/or diagnosis of SARS-CoV-2 by FDA under an Emergency Use Authorization (EUA). This EUA will remain  in effect (meaning this test can be used) for the duration of the COVID-19 declaration under Section 564(b)(1) of the Act, 21 U.S.C.section 360bbb-3(b)(1), unless the authorization is terminated  or revoked sooner.       Influenza A by PCR NEGATIVE NEGATIVE Final   Influenza B by PCR NEGATIVE NEGATIVE Final    Comment: (NOTE) The Xpert Xpress SARS-CoV-2/FLU/RSV plus assay is intended as an aid in the diagnosis of influenza from Nasopharyngeal swab specimens and should not be used as a sole basis for treatment. Nasal washings and aspirates are unacceptable for Xpert Xpress SARS-CoV-2/FLU/RSV testing.  Fact Sheet for Patients: EntrepreneurPulse.com.au  Fact Sheet for Healthcare Providers: IncredibleEmployment.be  This test is not yet approved or cleared by the [place] FDA and has been authorized for detection and/or diagnosis of SARS-CoV-2 by FDA under an Emergency Use Authorization (EUA). This EUA will remain in effect (meaning this test can be used) for the duration of the COVID-19 declaration under Section 564(b)(1) of the Act, 21 U.S.C. section 360bbb-3(b)(1), unless the authorization is  terminated or revoked.  Performed at [facility], [address] [place], [place] [address]   MRSA PCR Screening     Status: None   Collection Time: 10/28/20  6:54 PM   Specimen: Nasal Mucosa; Nasopharyngeal  Result Value Ref Range Status   MRSA by PCR NEGATIVE NEGATIVE Final    Comment:        The GeneXpert MRSA Assay (FDA approved for NASAL specimens only), is one component of a comprehensive MRSA colonization surveillance program. It is not intended to diagnose MRSA infection nor to guide or monitor treatment for MRSA infections. Performed at [facility], [address], [place], [place] [address]      Labs: BNP (last 3 results) Recent Labs    11/30/19 1110 09/30/20 0654  BNP 958.0* 0,175.1*   Basic Metabolic Panel: Recent Labs  Lab 10/25/20 0529 10/26/20 0558 10/27/20 0407 10/28/20 0532 10/28/20 2027 2020/11/25 0502  NA 133* 133* 134* 134* 133* 128*  K 4.3 3.7 3.9 4.2 4.5 5.0  CL 94* 94* 94* 95* 95* 87*  CO2 20* 26 23 22  18* 19*  GLUCOSE 74 93 89 103* 176* 437*  BUN 105* 58* 71* 86* 51* 54*  CREATININE 13.54* 8.42* 10.34* 11.72* 7.57* 8.29*  CALCIUM 6.5* 6.8* 6.8* 6.9* 6.9* 6.5*  MG  --   --   --   --  1.9  --   PHOS 9.1* 5.4* 6.3* 6.9*  --  7.6*   Liver Function Tests: Recent Labs  Lab 11/05/2020 1503 10/24/20 0507 10/26/20 0558 10/27/20 0407 10/28/20 0532 10/28/20 2027 2020/11/25 0502  AST 43*  --   --   --   --  38  --   ALT 29  --   --   --   --  32  --   ALKPHOS 75  --   --   --   --  52  --   BILITOT 0.7  --   --   --   --  1.0  --   PROT 7.8  --   --   --   --  6.6  --   ALBUMIN 2.7*   < > 2.0* 2.1* 2.1* 2.2* 1.9*   < > = values in this interval not displayed.   No results for input(s): LIPASE, AMYLASE in the last 168  hours. Recent Labs  Lab 10/28/20 2057  AMMONIA 25   CBC: Recent Labs  Lab 10/25/20 0529 10/26/20 0558 10/27/20 0407 10/28/20 0532 10/28/20 2027 2020/11/02 0831  WBC 16.0* 10.0 8.4 9.7 14.1* 20.2*  NEUTROABS 13.6*  7.8* 5.8 6.4  --  15.2*  HGB 7.6* 8.1* 8.1* 8.7* 8.2* 8.2*  HCT 24.2* 25.3* 24.9* 27.7* 25.8* 25.0*  MCV 92.0 89.7 91.2 91.1 92.8 90.3  PLT 213 237 220 234 187 222   Cardiac Enzymes: No results for input(s): CKTOTAL, CKMB, CKMBINDEX, TROPONINI in the last 168 hours. BNP: Invalid input(s): POCBNP CBG: Recent Labs  Lab 10/28/20 2055 2020-11-02 0004 November 02, 2020 0359 2020/11/02 0740 11/02/2020 1614  GLUCAP 187* 247* 237* 187* 134*   D-Dimer No results for input(s): DDIMER in the last 72 hours. Hgb A1c No results for input(s): HGBA1C in the last 72 hours. Lipid Profile Recent Labs    10/28/20 2027  TRIG 177*   Thyroid function studies Recent Labs    10/28/20 2057  TSH 7.523*   Anemia work up No results for input(s): VITAMINB12, FOLATE, FERRITIN, TIBC, IRON, RETICCTPCT in the last 72 hours. Urinalysis    Component Value Date/Time   COLORURINE AMBER (A) 10/22/2020 1344   APPEARANCEUR CLOUDY (A) 10/16/2020 1344   LABSPEC 1.015 10/28/2020 1344   PHURINE 6.0 10/24/2020 1344   GLUCOSEU 50 (A) 10/21/2020 1344   HGBUR SMALL (A) 10/15/2020 1344   BILIRUBINUR NEGATIVE 10/22/2020 1344   KETONESUR NEGATIVE 11/11/2020 1344   PROTEINUR >=300 (A) 10/19/2020 1344   UROBILINOGEN 1.0 02/23/2013 1136   NITRITE NEGATIVE 11/12/2020 1344   LEUKOCYTESUR LARGE (A) 10/22/2020 1344   Sepsis Labs Invalid input(s): PROCALCITONIN,  WBC,  LACTICIDVEN Microbiology Recent Results (from the past 240 hour(s))  Urine culture     Status: Abnormal   Collection Time: 10/20/2020  1:44 PM   Specimen: Urine, Catheterized  Result Value Ref Range Status   Specimen Description   Final    URINE, CATHETERIZED Performed at [facility], [address] [place], [place] [address]    Special Requests   Final    NONE Performed at [facility], [address], [place], [place] [address]    Culture >=100,000 COLONIES/mL PSEUDOMONAS AERUGINOSA (A)  Final   Report Status 10/26/2020 FINAL  Final   Organism ID,  Bacteria PSEUDOMONAS AERUGINOSA (A)  Final      Susceptibility   Pseudomonas aeruginosa - MIC*    CEFTAZIDIME 4 SENSITIVE Sensitive     CIPROFLOXACIN <=0.25 SENSITIVE Sensitive     GENTAMICIN <=1 SENSITIVE Sensitive     IMIPENEM 2 SENSITIVE Sensitive     PIP/TAZO 8 SENSITIVE Sensitive     CEFEPIME 2 SENSITIVE Sensitive     * >=100,000 COLONIES/mL PSEUDOMONAS AERUGINOSA  Blood culture (routine single)     Status: None   Collection Time: 10/18/2020  3:03 PM   Specimen: Right Antecubital; Blood  Result Value Ref Range Status   Specimen Description   Final    RIGHT ANTECUBITAL BOTTLES DRAWN AEROBIC AND ANAEROBIC   Special Requests Blood Culture adequate volume  Final   Culture   Final    NO GROWTH 5 DAYS Performed at [facility], [address], [place], [place] [address]    Report Status 10/28/2020 FINAL  Final  Resp Panel by RT-PCR (Flu A&B, Covid) Nasopharyngeal Swab     Status: None   Collection Time: 11/10/2020  3:56 PM   Specimen: Nasopharyngeal Swab; Nasopharyngeal(NP) swabs in vial transport medium  Result Value  Ref Range Status   SARS Coronavirus 2 by RT PCR NEGATIVE NEGATIVE Final    Comment: (NOTE) SARS-CoV-2 target nucleic acids are NOT DETECTED.  The SARS-CoV-2 RNA is generally detectable in upper respiratory specimens during the acute phase of infection. The lowest concentration of SARS-CoV-2 viral copies this assay can detect is 138 copies/mL. A negative result does not preclude SARS-Cov-2 infection and should not be used as the sole basis for treatment or other patient management decisions. A negative result may occur with  improper specimen collection/handling, submission of specimen other than nasopharyngeal swab, presence of viral mutation(s) within the areas targeted by this assay, and inadequate number of viral copies(<138 copies/mL). A negative result must be combined with clinical observations, patient history, and epidemiological information. The expected  result is Negative.  Fact Sheet for Patients:  EntrepreneurPulse.com.au  Fact Sheet for Healthcare Providers:  IncredibleEmployment.be  This test is no t yet approved or cleared by the [place] FDA and  has been authorized for detection and/or diagnosis of SARS-CoV-2 by FDA under an Emergency Use Authorization (EUA). This EUA will remain  in effect (meaning this test can be used) for the duration of the COVID-19 declaration under Section 564(b)(1) of the Act, 21 U.S.C.section 360bbb-3(b)(1), unless the authorization is terminated  or revoked sooner.       Influenza A by PCR NEGATIVE NEGATIVE Final   Influenza B by PCR NEGATIVE NEGATIVE Final    Comment: (NOTE) The Xpert Xpress SARS-CoV-2/FLU/RSV plus assay is intended as an aid in the diagnosis of influenza from Nasopharyngeal swab specimens and should not be used as a sole basis for treatment. Nasal washings and aspirates are unacceptable for Xpert Xpress SARS-CoV-2/FLU/RSV testing.  Fact Sheet for Patients: EntrepreneurPulse.com.au  Fact Sheet for Healthcare Providers: IncredibleEmployment.be  This test is not yet approved or cleared by the [place] FDA and has been authorized for detection and/or diagnosis of SARS-CoV-2 by FDA under an Emergency Use Authorization (EUA). This EUA will remain in effect (meaning this test can be used) for the duration of the COVID-19 declaration under Section 564(b)(1) of the Act, 21 U.S.C. section 360bbb-3(b)(1), unless the authorization is terminated or revoked.  Performed at [facility], [address] [place], [place] [address]   MRSA PCR Screening     Status: None   Collection Time: 10/13/2020 10:38 PM   Specimen: Nasal Mucosa; Nasopharyngeal  Result Value Ref Range Status   MRSA by PCR NEGATIVE NEGATIVE Final    Comment:        The GeneXpert MRSA Assay (FDA approved for NASAL specimens only), is one  component of a comprehensive MRSA colonization surveillance program. It is not intended to diagnose MRSA infection nor to guide or monitor treatment for MRSA infections. Performed at [facility], [address], [place], [place] [address]   Resp Panel by RT-PCR (Flu A&B, Covid) Nasopharyngeal Swab     Status: None   Collection Time: 10/28/20  3:20 PM   Specimen: Nasopharyngeal Swab; Nasopharyngeal(NP) swabs in vial transport medium  Result Value Ref Range Status   SARS Coronavirus 2 by RT PCR NEGATIVE NEGATIVE Final    Comment: (NOTE) SARS-CoV-2 target nucleic acids are NOT DETECTED.  The SARS-CoV-2 RNA is generally detectable in upper respiratory specimens during the acute phase of infection. The lowest concentration of SARS-CoV-2 viral copies this assay can detect is 138 copies/mL. A negative result does not preclude SARS-Cov-2 infection and should not be used as the sole basis for treatment or other  patient management decisions. A negative result may occur with  improper specimen collection/handling, submission of specimen other than nasopharyngeal swab, presence of viral mutation(s) within the areas targeted by this assay, and inadequate number of viral copies(<138 copies/mL). A negative result must be combined with clinical observations, patient history, and epidemiological information. The expected result is Negative.  Fact Sheet for Patients:  EntrepreneurPulse.com.au  Fact Sheet for Healthcare Providers:  IncredibleEmployment.be  This test is no t yet approved or cleared by the [place] FDA and  has been authorized for detection and/or diagnosis of SARS-CoV-2 by FDA under an Emergency Use Authorization (EUA). This EUA will remain  in effect (meaning this test can be used) for the duration of the COVID-19 declaration under Section 564(b)(1) of the Act, 21 U.S.C.section 360bbb-3(b)(1), unless the authorization is terminated  or  revoked sooner.       Influenza A by PCR NEGATIVE NEGATIVE Final   Influenza B by PCR NEGATIVE NEGATIVE Final    Comment: (NOTE) The Xpert Xpress SARS-CoV-2/FLU/RSV plus assay is intended as an aid in the diagnosis of influenza from Nasopharyngeal swab specimens and should not be used as a sole basis for treatment. Nasal washings and aspirates are unacceptable for Xpert Xpress SARS-CoV-2/FLU/RSV testing.  Fact Sheet for Patients: EntrepreneurPulse.com.au  Fact Sheet for Healthcare Providers: IncredibleEmployment.be  This test is not yet approved or cleared by the [place] FDA and has been authorized for detection and/or diagnosis of SARS-CoV-2 by FDA under an Emergency Use Authorization (EUA). This EUA will remain in effect (meaning this test can be used) for the duration of the COVID-19 declaration under Section 564(b)(1) of the Act, 21 U.S.C. section 360bbb-3(b)(1), unless the authorization is terminated or revoked.  Performed at [facility], [address], [place], [place] [address]   MRSA PCR Screening     Status: None   Collection Time: 10/28/20  6:54 PM   Specimen: Nasal Mucosa; Nasopharyngeal  Result Value Ref Range Status   MRSA by PCR NEGATIVE NEGATIVE Final    Comment:        The GeneXpert MRSA Assay (FDA approved for NASAL specimens only), is one component of a comprehensive MRSA colonization surveillance program. It is not intended to diagnose MRSA infection nor to guide or monitor treatment for MRSA infections. Performed at [facility], [address], [place], [place] [address]      Time coordinating discharge: 35 minutes  SIGNED:   Rodena Goldmann, DO [facility] 10/30/2020, 7:35 AM  If 7PM-7AM, please contact night-coverage [email or web address]

## 2020-11-13 NOTE — Progress Notes (Signed)
ETT advanced from 25 to 27cm at lip per MD

## 2020-11-13 NOTE — Progress Notes (Signed)
Transitioning to comfort care therefore no CRRT/transfer to [facility]. Will sign off from a nephrology perspective. Thank you for allowing [place] to participate in the care of this patient. Please call with any questions/concerns.  Gean Quint, MD [facility]

## 2020-11-13 NOTE — Progress Notes (Signed)
Patient died at 23, death verified by 2 RN's. Family at bedside. MD aware. Patient placement aware. Alesia Morin at [facility] aware, referral number [phone]. Patient to be transferred to morgue.

## 2020-11-13 NOTE — ED Provider Notes (Signed)
I was called to the bedside to assist with emergent intubation of this patient by Dr. Lucile Shutters.  There were concerns that the patient had become tachycardic and suddenly unresponsive, with diminished GCS, requiring airway protection.  A discussion regarding goals of care was had between the patient's son and Dr. Lucile Shutters prior to my arrival, with the consensus to proceed with intubation at this time.  The patient's son confirmed with me that he would like to proceed with intubation prior to the procedure.   See procedure note below.  IV rocuronium and IV etomidate was used for induction.  Procedure Name: Intubation Date/Time: 2020-11-17 10:38 AM Performed by: Wyvonnia Dusky, MD Pre-anesthesia Checklist: Patient identified, Patient being monitored, Emergency Drugs available, Timeout performed and Suction available Oxygen Delivery Method: Non-rebreather mask Preoxygenation: Pre-oxygenation with 100% oxygen Induction Type: Rapid sequence Ventilation: Mask ventilation without difficulty Tube size: 7.5 mm Number of attempts: 1 Airway Equipment and Method: Video-laryngoscopy Placement Confirmation: ETT inserted through vocal cords under direct vision,  CO2 detector and Breath sounds checked- equal and bilateral Secured at: 25 cm Tube secured with: ETT holder         Wyvonnia Dusky, MD 11-17-2020 1039

## 2020-11-13 NOTE — Consult Note (Addendum)
Cardiology Consultation:   Patient ID: Michael Trevino MRN: [id number]; DOB: 12-Sep-1941  Admit date: 10/31/2020 Date of Consult: 2020-11-04  Primary Care Provider: Dettinger, Fransisca Kaufmann, MD [facility] Cardiologist: Minus Breeding, MD  [facility] Electrophysiologist:  None    Patient Profile:   Michael Trevino is a 80 y.o. male with a hx of CAD (prior MI 2000 at [facility] without records, NSTEMI 09/2020 with occluded RCA -> med rx), chronic diastolic CHF, DM, HTN, ESRD recently started on HD, moderate AS by echo 09/2020, pleural effusion s/p thoracentesis 09/2020, urinary retention, UTI, hypothyroidism, dementia, recent anemia requiring transfusion who is being seen today for the evaluation of atrial fib at the request of Dr. Manuella Ghazi.  History of Present Illness:   Mr. Howell has had a tumultuous course lately. In 09/2020 he was slated for planned AVF creation but upon checking in reported chest pain and was subsequently admitted to the hospital. He was found to have large left pleural effusion s/p thoracentesis, NSTEMI and volume overload unresponsive to diuretics requiring transition to hemodialysis. He underwent cath at that time showing chronic total occlusion of the RCA with L-R collaterals and 50% mLAD, treated medically. 2D Echo 09/30/20 had shown EF 55-60%, severe LVH, normal RV, small pericardial effusion, moderate AS. He  Was discharged to SNF but then brought back 10/13/2020 with fever, altered mental status and right hip pain. Per report from son he missed HD on 12/9 due to having to have blood transfusion that day of 2 PRBCs. On arrival to the ED, he was found to have marked leukocytosis, urinary retention and pseudomonas UTI. Hip films without fx. Hospitalization also notable for acute metabolic encephalopathy superimposed on progressive dementia with delirium as well as poor oral intake. Palliative care conversations ensued with direction of care still undecided by family per d/w Dr. Manuella Ghazi. He  was initially slated to be discharged back to SNF yesterday but after HD developed recurrent fever to 101.2, decreased responsiveness (to deep sternal rub) requiring intubation for airway protection, tachycardia, hypotension and AF RVR (new) requiring transition to the ICU. CT head without acute intracranial pathology. He received amiodarone bolus then started on drip as well as mag, 250 saline bolus. He was also started on pressors (phenylephrine) for BP support. Labs reveal marked hyperglycemia, hyponatremia, hypoalbuminemia, rising leukocytosis (20, up from 14 - had normalized this admission), continued anemia with Hgb 8.2 most recent nadir 6.9 in 09/2020). TSH mildly abnormal 7.5.  He converted to NSR around 20:00 last night but remains significantly hypotensive, specifically with profound diastolic hypotension. Last VS showed hypothermia at 95.9, HR controlled, last BP 90/28.  Past Medical History:  Diagnosis Date  . Anemia   . Arthritis   . CAD (coronary artery disease)   . Chronic diastolic CHF (congestive heart failure) ([place])   . Delirium   . Dementia ([place])   . Diabetes mellitus without complication ([place])   . ESRD (end stage renal disease) ([place])   . Heart attack ([place])   . Hyperlipidemia   . Hypertension   . Hypoalbuminemia   . MI (myocardial infarction) ([place]) 2000  . Moderate aortic stenosis   . NSTEMI (non-ST elevated myocardial infarction) ([place])   . Pleural effusion   . Renal disorder   . Sepsis ([place])   . Sepsis due to Streptococcus, group B ([place]) 02/24/2013  . Stroke ([place])   . Thyroid disease    hypothyroid    Past Surgical History:  Procedure Laterality Date  . AV FISTULA PLACEMENT Left  10/05/2020   Procedure: LEFT ARM FIRST STAGE BASILIC  ARTERIOVENOUS (AV) FISTULA CREATION;  Surgeon: Angelia Mould, MD;  Location: [facility];  Service: Vascular;  Laterality: Left;  . CARDIAC CATHETERIZATION  2000  . INSERTION OF DIALYSIS CATHETER Right 10/05/2020   Procedure:  INSERTION OF RIGHT INTERNAL JUGULAR TUNNELED DIALYSIS CATHETER;  Surgeon: Angelia Mould, MD;  Location: [facility];  Service: Vascular;  Laterality: Right;  . IR FLUORO GUIDE CV LINE RIGHT  10/01/2020  . IR US GUIDE VASC ACCESS RIGHT  10/01/2020  . LEFT HEART CATH AND CORONARY ANGIOGRAPHY N/A 10/04/2020   Procedure: LEFT HEART CATH AND CORONARY ANGIOGRAPHY;  Surgeon: Lorretta Harp, MD;  Location: [facility];  Service: Cardiovascular;  Laterality: N/A;  . TEE WITHOUT CARDIOVERSION N/A 02/26/2013   Procedure: TRANSESOPHAGEAL ECHOCARDIOGRAM (TEE);  Surgeon: Thayer Headings, MD;  Location: [place];  Service: Cardiovascular;  Laterality: N/A;     Home Medications:  Prior to Admission medications   Medication Sig Start Date End Date Taking? Authorizing Provider  acetaminophen (TYLENOL) 325 MG tablet Take 2 tablets (650 mg total) by mouth every 4 (four) hours as needed for headache or mild pain. 10/13/20  Yes Nolberto Hanlon, MD  amLODipine (NORVASC) 10 MG tablet TAKE 1 TABLET BY MOUTH EVERY DAY Patient taking differently: Take 10 mg by mouth daily. 06/14/20  Yes Dettinger, Fransisca Kaufmann, MD  ascorbic acid (VITAMIN C) 500 MG tablet Take 500 mg by mouth daily.   Yes [provider]  aspirin 81 MG chewable tablet Chew 1 tablet (81 mg total) by mouth daily. 10/14/20  Yes Nolberto Hanlon, MD  atorvastatin (LIPITOR) 80 MG tablet Take 1 tablet (80 mg total) by mouth daily. 10/14/20  Yes Nolberto Hanlon, MD  calcitRIOL (ROCALTROL) 0.5 MCG capsule Take 0.5 mcg by mouth daily. 04/09/20  Yes [provider]  Calcium Carbonate 500 MG CHEW Chew 1 tablet (500 mg total) by mouth 3 (three) times daily as needed. Patient taking differently: Chew 500 mg by mouth 3 (three) times daily as needed (indigestion). 05/01/16  Yes Cherre Robins, PharmD  cholecalciferol (VITAMIN D3) 25 MCG (1000 UNIT) tablet Take 1,000 Units by mouth daily.   Yes [provider]  hydrALAZINE (APRESOLINE) 50 MG tablet  Take 1 tablet (50 mg total) by mouth 3 (three) times daily. 10/13/20  Yes Nolberto Hanlon, MD  insulin detemir (LEVEMIR FLEXPEN) 100 UNIT/ML FlexPen Inject 25 Units into the skin at bedtime. 06/23/20  Yes Brita Romp, NP  metoprolol tartrate (LOPRESSOR) 100 MG tablet Take 1 tablet (100 mg total) by mouth 2 (two) times daily. 10/13/20  Yes Nolberto Hanlon, MD  multivitamin (RENA-VIT) TABS tablet Take 1 tablet by mouth at bedtime. 10/13/20  Yes Nolberto Hanlon, MD  atorvastatin (LIPITOR) 40 MG tablet TAKE 1 TABLET BY MOUTH EVERY DAY  (Needs to be seen before next refill) 10/28/20   Dettinger, Fransisca Kaufmann, MD  bisacodyl (DULCOLAX) 10 MG suppository Place 1 suppository (10 mg total) rectally daily as needed for moderate constipation. Patient not taking: No sig reported 10/13/20   Nolberto Hanlon, MD  Darbepoetin Alfa (ARANESP) 40 MCG/0.4ML SOSY injection Inject 0.4 mLs (40 mcg total) into the vein every Thursday with hemodialysis. 10/14/20   Nolberto Hanlon, MD  glucose blood test strip Use to check blood glucose once daily.  Dx:  E11.9 07/14/15   Wardell Honour, MD  levothyroxine (SYNTHROID) 175 MCG tablet TAKE 1 TABLET BY MOUTH DAILY BEFORE BREAKFAST. 10/27/20   Marinus Maw,  Whitney J, NP  nitroGLYCERIN (NITROSTAT) 0.4 MG SL tablet Place 1 tablet (0.4 mg total) under the tongue every 5 (five) minutes as needed for chest pain. 10/13/20   Nolberto Hanlon, MD  Nutritional Supplements (FEEDING SUPPLEMENT, NEPRO CARB STEADY,) LIQD Take 237 mLs by mouth 2 (two) times daily between meals. 10/13/20   Nolberto Hanlon, MD  oxyCODONE-acetaminophen (PERCOCET) 5-325 MG tablet Take 1 tablet by mouth every 4 (four) hours as needed for severe pain. 10/28/20   Manuella Ghazi, Pratik D, DO  tamsulosin (FLOMAX) 0.4 MG CAPS capsule Take 1 capsule (0.4 mg total) by mouth daily after supper. 10/28/20 11/27/20  Heath Lark D, DO    Inpatient Medications: Scheduled Meds: . [START ON 10/30/2020] aspirin  81 mg Per Tube Daily  . atorvastatin  80 mg Per Tube  Daily  . calcitRIOL  0.5 mcg Oral Daily  . chlorhexidine gluconate (MEDLINE KIT)  15 mL Mouth Rinse BID  . Chlorhexidine Gluconate Cloth  6 each Topical Daily  . docusate  100 mg Per Tube BID  . feeding supplement (NEPRO CARB STEADY)  237 mL Per Tube BID BM  . insulin aspart  0-9 Units Subcutaneous TID WC  . insulin detemir  12 Units Subcutaneous QHS  . levothyroxine  175 mcg Per Tube Q0600  . mouth rinse  15 mL Mouth Rinse 10 times per day  . metoprolol tartrate  100 mg Per Tube BID  . polyethylene glycol  17 g Per Tube Daily  . tamsulosin  0.4 mg Oral QPC supper   Continuous Infusions: . sodium chloride    . sodium chloride    . amiodarone 30 mg/hr (11/07/20 0754)  . ceFEPime (MAXIPIME) IV 2 g (10/28/20 1659)  . methocarbamol (ROBAXIN) IV    . phenylephrine (NEO-SYNEPHRINE) Adult infusion 260 mcg/min (2020-11-07 0833)  . propofol (DIPRIVAN) infusion 15 mcg/kg/min (Nov 07, 2020 0754)   PRN Meds: sodium chloride, sodium chloride, acetaminophen (TYLENOL) oral liquid 160 mg/5 mL **OR** acetaminophen, alteplase, bisacodyl, fentaNYL (SUBLIMAZE) injection, heparin, HYDROcodone-acetaminophen, methocarbamol (ROBAXIN) IV  Allergies:    Allergies  Allergen Reactions  . Zocor [Simvastatin - High Dose] Nausea Only    Unknown    Social History:   Social History   Socioeconomic History  . Marital status: Married    Spouse name: Not on file  . Number of children: 6  . Years of education: 60  . Highest education level: 9th grade  Occupational History  . 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 Smoking status: Former Smoker    Packs/day: 0.50    Years: 30.00    Pack years: 15.00    Types: Cigarettes    Quit date: 11/05/1984    Years since quitting: 36.0  . Smokeless tobacco: Never Used  Vaping Use  . Vaping Use: Never used  Substance and Sexual Activity  . Alcohol use: No  . Drug use:  No  . Sexual activity: Yes  Other Topics Concern  . Not on file  Social History Narrative  . Not on file   Social Determinants of Health   Financial Resource Strain: Low Risk   . Difficulty of Paying Living Expenses: Not hard at all  Food Insecurity: No Food Insecurity  . Worried About Charity fundraiser in the Last Year: Never true  . Ran Out of Food in the Last Year: Never true  Transportation Needs: No Transportation Needs  .  Lack of Transportation (Medical): No  . Lack of Transportation (Non-Medical): No  Physical Activity: Inactive  . Days of Exercise per Week: 0 days  . Minutes of Exercise per Session: 0 min  Stress: No Stress Concern Present  . Feeling of Stress : Not at all  Social Connections: Moderately Integrated  . Frequency of Communication with Friends and Family: Once a week  . Frequency of Social Gatherings with Friends and Family: More than three times a week  . Attends Religious Services: More than 4 times per year  . Active Member of Clubs or Organizations: No  . Attends Archivist Meetings: Never  . Marital Status: Married  Human resources officer Violence: Not At Risk  . Fear of Current or Ex-Partner: No  . Emotionally Abused: No  . Physically Abused: No  . Sexually Abused: No    Family History:    Family History  Problem Relation Age of Onset  . Diabetes Mother   . Hypertension Mother   . Cancer Father   . Stroke Brother   . Alcohol abuse Brother   . Diabetes Brother   . Diabetes Sister   . Diabetes Brother 13       TYPE 1  . Kidney disease Brother 51       DIALYSIS  . Heart attack Brother   . Healthy Daughter   . Healthy Son   . Healthy Daughter   . Healthy Son   . Healthy Son   . Healthy Son      ROS:  Please see the history of present illness.   Unable to assess due to intubated/sedated  Physical Exam/Data:   Vitals:   11-28-20 0545 11-28-20 0600 11/28/20 0700 11-28-20 0753  BP: (!) 109/38 (!) 96/37 (!) 122/28   Pulse:  73 73 73 74  Resp: (!) 25 (!) 25 (!) 22 (!) 31  Temp:      TempSrc:      SpO2: 100% 100% 100% 100%  Weight:      Height:        Intake/Output Summary (Last 24 hours) at 28-Nov-2020 0911 Last data filed at November 28, 2020 0754 Gross per 24 hour  Intake 2108.72 ml  Output 766 ml  Net 1342.72 ml   Last 3 Weights 11/28/20 10/28/2020 10/28/2020  Weight (lbs) 208 lb 1.8 oz 205 lb 14.6 oz 204 lb 2.3 oz  Weight (kg) 94.4 kg 93.4 kg 92.6 kg     Body mass index is 30.73 kg/m.  Vital Signs. BP (!) 122/28   Pulse 74   Temp (!) 95.9 F (35.5 C) (Axillary)   Resp (!) 31   Ht '5\' 9"'  (1.753 m)   Wt 94.4 kg   SpO2 100%   BMI 30.73 kg/m  General: Ill appearing elderly WM in no acute distress on vent with sallow complexion Head: Normocephalic, atraumatic, sclera non-icteric, no xanthomas, nares are without discharge. Neck: Negative for carotid bruits. JVP not elevated. Lungs: Coarse BS throughout. Breathing is unlabored on vent Heart: RRR S1 S2 without murmurs, rubs, or gallops.  Abdomen: Soft, non-tender, non-distended with normoactive bowel sounds. No rebound/guarding. Extremities: No clubbing or cyanosis. No edema. Distal pedal pulses are diminished bilaterally Neuro: Sedated Psych:  Unable to assess  EKG:  Multiple tracings personally reviewed with last tracing yesterday PM (post conversion) with sinus tach 117bpm hyperacute T Waves in V3-V4, TWI I, avL  Telemetry:  Telemetry was personally reviewed and demonstrates:  AF RVR Then NSR 20:02  Relevant CV Studies:  1. Left ventricular ejection fraction, by estimation, is 55 to 60%. The  left ventricle has normal function. The left ventricle has no regional  wall motion abnormalities. There is severe left ventricular hypertrophy.  Left ventricular diastolic parameters  are indeterminate. Elevated left atrial pressure.  2. Right ventricular systolic function is normal. The right ventricular  size is normal.  3. A small pericardial  effusion is present. The pericardial effusion is  circumferential. There is no evidence of cardiac tamponade. Large pleural  effusion in the left lateral region.  4. The mitral valve is normal in structure. Trivial mitral valve  regurgitation. No evidence of mitral stenosis.  5. The aortic valve was not well visualized. There is mild calcification  of the aortic valve. There is mild thickening of the aortic valve. Aortic  valve regurgitation is moderate. Moderate aortic valve stenosis.  6. The inferior vena cava is normal in size with greater than 50%  respiratory variability, suggesting right atrial pressure of 3 mmHg.   Laboratory Data:  High Sensitivity Troponin:   Recent Labs  Lab 09/30/20 0654 09/30/20 0854 10/01/20 0458 10/01/20 0911  TROPONINIHS 79* 194* 2,776* 2,891*     Chemistry Recent Labs  Lab 10/27/20 0407 10/28/20 0532 10/28/20 2027 10-30-20 0502  NA 134* 134* 133* 128*  K 3.9 4.2 4.5 5.0  CL 94* 95* 95* 87*  CO2 23 22 18* 19*  GLUCOSE 89 103* 176* 437*  BUN 71* 86* 51* 54*  CREATININE 10.34* 11.72* 7.57* 8.29*  CALCIUM 6.8* 6.9* 6.9* 6.5*  GFRNONAA 5* 4* 7* 6*  ANIONGAP 17* 17* 20*  --     Recent Labs  Lab 10/20/2020 1503 10/24/20 0507 10/28/20 0532 10/28/20 2027 10-30-2020 0502  PROT 7.8  --   --  6.6  --   ALBUMIN 2.7*   < > 2.1* 2.2* 1.9*  AST 43*  --   --  38  --   ALT 29  --   --  32  --   ALKPHOS 75  --   --  52  --   BILITOT 0.7  --   --  1.0  --    < > = values in this interval not displayed.   Hematology Recent Labs  Lab 10/28/20 0532 10/28/20 2027 30-Oct-2020 0831  WBC 9.7 14.1* 20.2*  RBC 3.04* 2.78* 2.77*  HGB 8.7* 8.2* 8.2*  HCT 27.7* 25.8* 25.0*  MCV 91.1 92.8 90.3  MCH 28.6 29.5 29.6  MCHC 31.4 31.8 32.8  RDW 13.7 14.1 14.1  PLT 234 187 222   BNPNo results for input(s): BNP, PROBNP in the last 168 hours.  DDimer No results for input(s): DDIMER in the last 168 hours.   Radiology/Studies:  CT HEAD WO  CONTRAST  Result Date: 10/28/2020 CLINICAL DATA:  Neuro deficit, acute, stroke suspected Mental status change, unknown cause neuro deficit EXAM: CT HEAD WITHOUT CONTRAST TECHNIQUE: Contiguous axial images were obtained from the base of the skull through the vertex without intravenous contrast. COMPARISON:  None. FINDINGS: Brain: Age related atrophy. Moderate chronic small vessel ischemia. 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 No intracranial hemorrhage, mass effect, or midline shift. No hydrocephalus. The basilar cisterns are patent. No evidence of territorial infarct or acute ischemia. No extra-axial or intracranial fluid collection. Vascular: Atherosclerosis of skullbase vasculature without hyperdense vessel or abnormal calcification. Skull: No fracture or focal lesion. Sinuses/Orbits: Paranasal sinuses and mastoid air cells are clear. The visualized orbits are unremarkable.  Other: None. IMPRESSION: 1. No acute intracranial abnormality. 2. 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 Electronically Signed   By: Keith Rake M.D.   On: 10/28/2020 20:51   DG Chest Port 1 View  Result Date: 11/16/2020 CLINICAL DATA:  80 year old male with respiratory failure, intubated. Recently negative for COVID-19. EXAM: PORTABLE CHEST 1 VIEW COMPARISON:  Portable chest 10/28/2020 and earlier. FINDINGS: Portable AP upright view at 0504 hours. Endotracheal tube tip in good position between the level the clavicles and carina. Enteric tube courses to the abdomen, tip not included. Stable right chest dual lumen dialysis type catheter. Moderate left veiling opacity in the lower lung compatible with pleural effusion, not significantly changed. No superimposed pneumothorax or pulmonary edema. Probable small right pleural effusion is more apparent. Mediastinal contours remain within normal limits. Paucity  of bowel gas in the upper abdomen. Stable visualized osseous structures. IMPRESSION: 1.  Stable lines and tubes. 2. Moderate left pleural effusion not significantly changed. Probable small right pleural effusion which is more apparent. No pulmonary edema. Electronically Signed   By: Genevie Ann M.D.   On: 11/16/2020 05:38   DG CHEST PORT 1 VIEW  Result Date: 10/28/2020 CLINICAL DATA:  Hypoxia. EXAM: PORTABLE CHEST 1 VIEW COMPARISON:  10/15/2020 FINDINGS: Right-sided dialysis catheter remains in place. Endotracheal tube tip 5.2 cm from the carina. Enteric tube in place with tip below the diaphragm not included in the field of view. Heart is upper normal in size. Hazy opacity at the left lung base likely combination of pleural fluid and atelectasis. Mild vascular congestion. No pneumothorax. IMPRESSION: 1. Endotracheal tube tip 5.2 cm from the carina. Enteric tube in place with tip below the diaphragm not included in the field of view. 2. Hazy opacity at the left lung base likely combination of pleural fluid and atelectasis. 3. Mild vascular congestion. Electronically Signed   By: Keith Rake M.D.   On: 10/28/2020 20:19     Assessment and Plan:   1. Profound hypotension/tachycardia, decreased responsiveness requiring ventilator, and recurrent fever 10/28/20 - given that hypotension persists despite maintenance of NSR, suspect additional processes at play aside from development of atrial fib (often a harbinger that something else clinically is going on) - further infection w/u per IM - continue supportive care - repeat EKG now, repeat echo ordered to assess for LV wall motion, recent small pericardial effusion and moderate AS also seen last month - addendum - just received word that patient will be going comfort care so will discontinue these orders - hold metoprolol given continued hypotension (received this AM) - continue to engage palliative discussions  2. Atrial fib RVR - new diagnosis for  patient - converted to NSR and remains on amio gtt - would continue for now - will discuss anticoag with MD - given anemia requiring recent transfusion and multitude of medical issues as well as fairly transient duration, suspect we may hold off on this - see above re: metoprolol  3. CAD s/p recent NSTEMI managed medically - supportive care - aspirin, statin to be given daily per tube per [facility]- other meds on hold due to hypotension - managed in context above  4. Pseudomonal UTI - per primary team  5. Hypothyroidism - primary team following up free hormones   New York Heart Association (NYHA) Functional Class Unable to assess given acute illness   CHA2DS2-VASc Score = 6  This indicates a 9.7% annual  risk of stroke. The patient's score is based upon: CHF History: Yes HTN History: Yes Diabetes History: Yes Stroke History: No Vascular Disease History: Yes Age Score: 2 Gender Score: 0    For questions or updates, please contact [place] Please consult [email or web address] for contact info under    Signed, Charlie Pitter, PA-C  11-11-20 9:11 AM  Prior to my evaluation I was notified that patient has been transitioned to comfort care by the primary team. We will no pursue any additional cardiac testing or interventions, there is no charge placed for this consultation, we will discontinue the consult.   Carlyle Dolly MD

## 2020-11-13 NOTE — TOC Progression Note (Signed)
Transition of Care [facility]) - Progression Note    Patient Details  Name: Michael Trevino MRN: [id number] Date of Birth: 26-Jul-1941  Transition of Care [facility]) CM/SW Contact  Boneta Lucks, RN Phone Number: 2020-11-23, 8:46 AM  Clinical Narrative:   Patient now intubated. Updated Larey Dresser with patient status. INS Auth expired, Will need another PT eval when stable. Also updated Stephanie with [facility] that patient is not a [facility]. TOC to follow.     Expected Discharge Plan: Skilled Nursing Facility Barriers to Discharge: Continued Medical Work up  Expected Discharge Plan and Services Expected Discharge Plan: [place]      Readmission Risk Interventions Readmission Risk Prevention Plan 10/28/2020 10/25/2020  Transportation Screening Complete Complete  Palliative Care Screening - Not Applicable  Medication Review ([place]) Complete Complete  PCP or Specialist appointment within 3-5 days of discharge Complete -  [place] or Home Care Consult Complete -  [facility] Recovery Care/Counseling Consult Complete -  Palliative Care Screening Complete -  [place] Complete -  Some recent data might be hidden

## 2020-11-13 DEATH — deceased

## 2020-11-17 ENCOUNTER — Encounter (HOSPITAL_COMMUNITY): Payer: Medicare Other

## 2020-11-29 ENCOUNTER — Telehealth: Payer: Medicare Other

## 2020-12-06 ENCOUNTER — Ambulatory Visit: Payer: Medicare Other | Admitting: Urology

## 2020-12-08 ENCOUNTER — Ambulatory Visit: Payer: Medicare Other | Admitting: Cardiology

## 2021-05-25 IMAGING — CT CT HEAD W/O CM
3 series · 16 of 47 positions shown, 19 images · non-contrast
Comparison: None.

CLINICAL DATA: Neuro deficit, acute, stroke suspected Mental status
change, unknown cause neuro deficit

EXAM:
CT HEAD WITHOUT CONTRAST
TECHNIQUE: Contiguous axial images were obtained from the base of the skull
through the vertex without intravenous contrast.

[Series 2: head w o · axial · 0.42mm/px · z∈[+7,+137]mm · 10 of 32 slices shown, 13 images]
[im 3/32  brain]
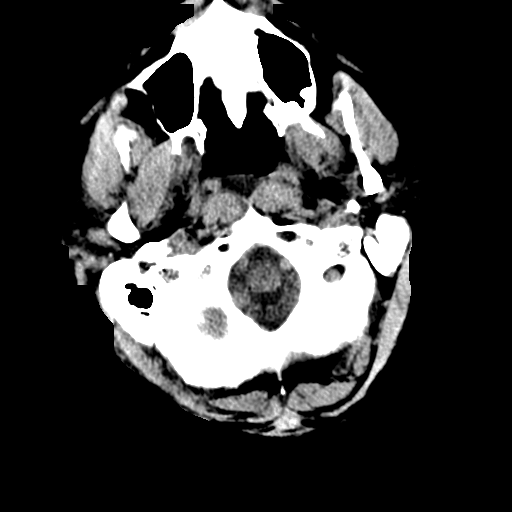
[im 3/32  bone]
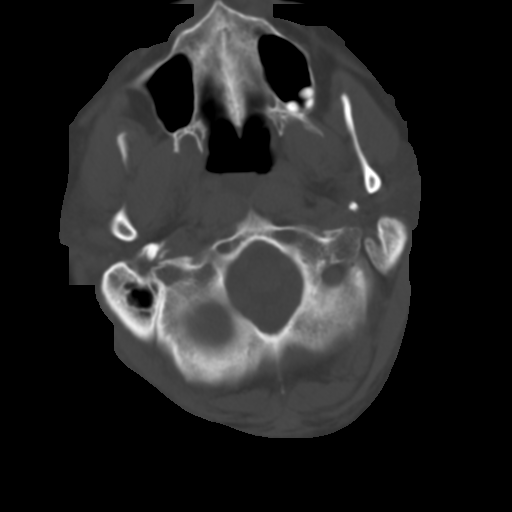
[im 6/32  brain]
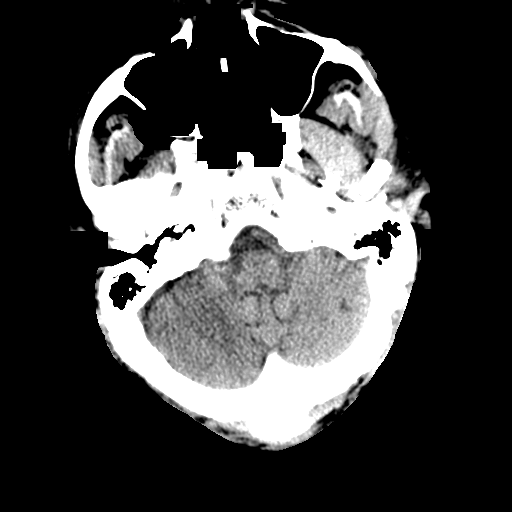
[im 9/32  brain]
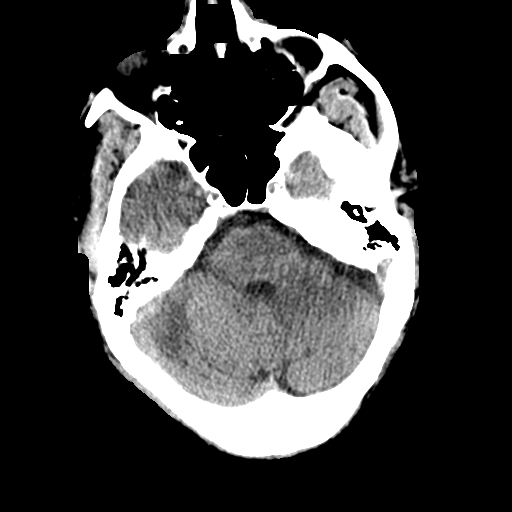
[im 11/32  brain]
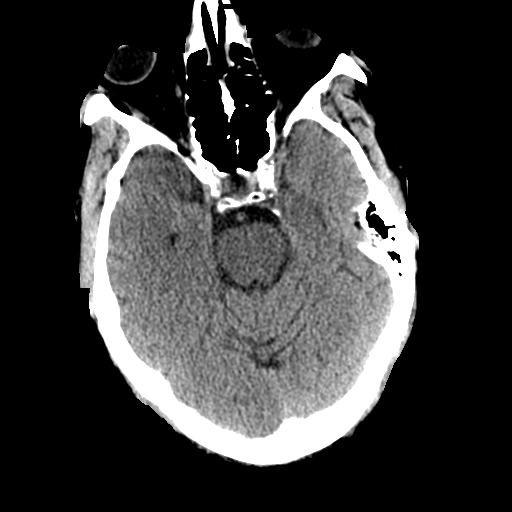
[im 14/32  brain]
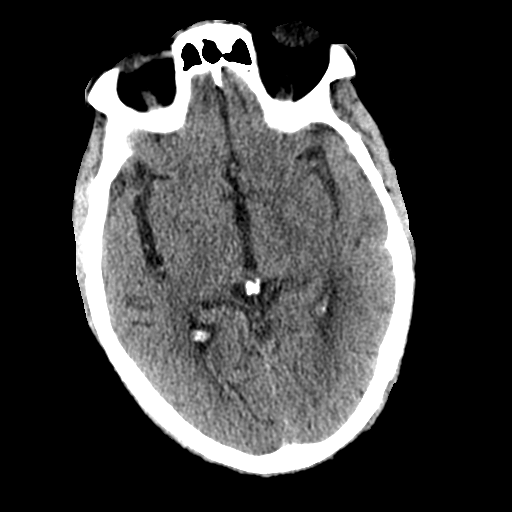
[im 14/32  bone]
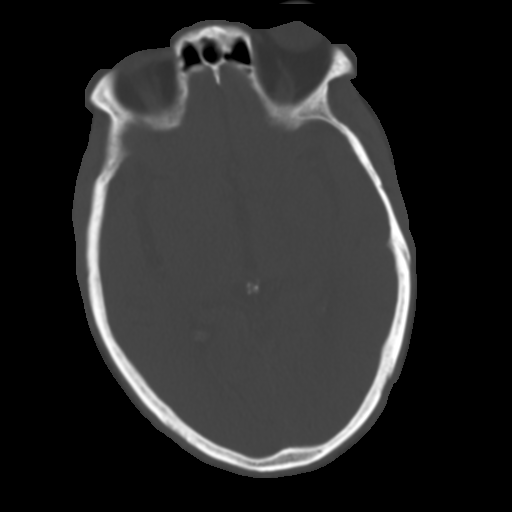
[im 18/32  brain]
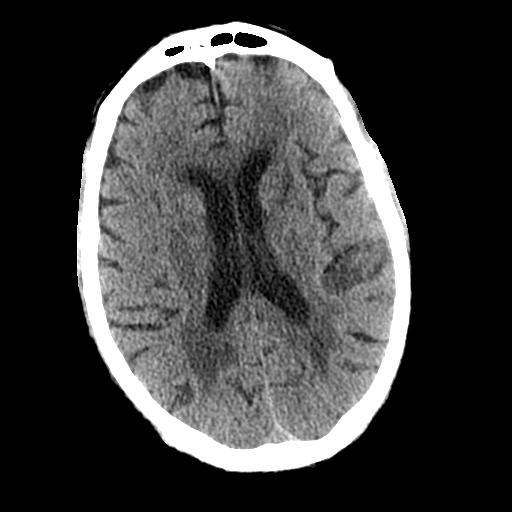
[im 21/32  brain]
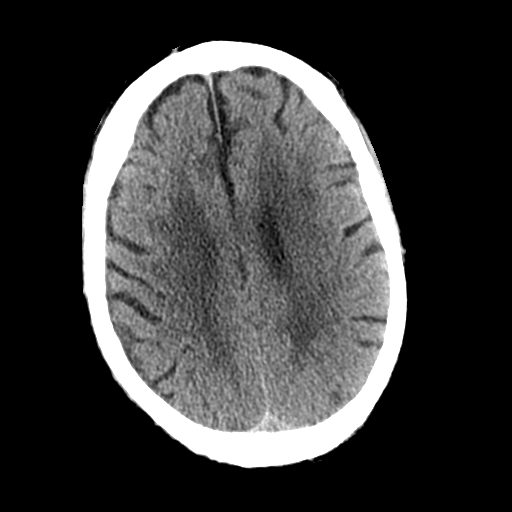
[im 24/32  brain]
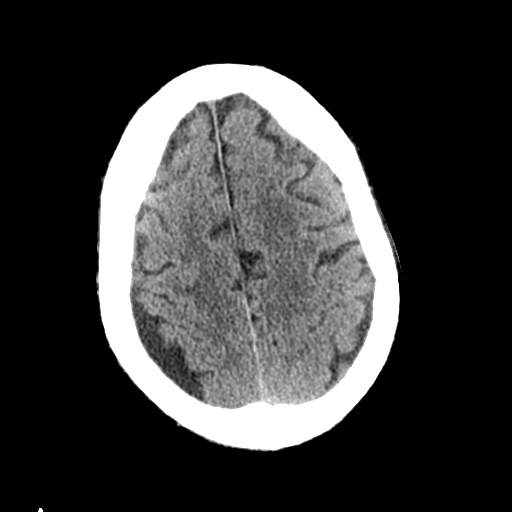
[im 26/32  brain]
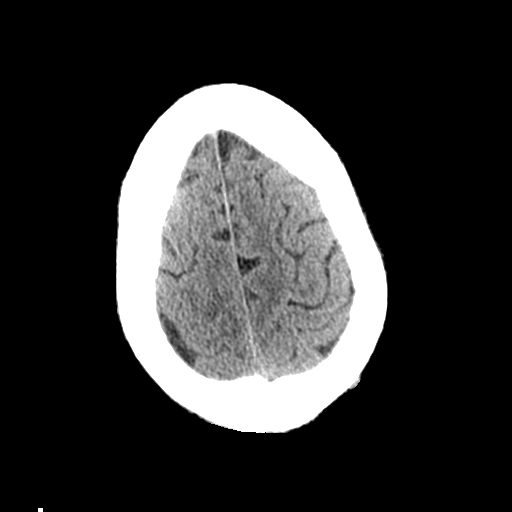
[im 26/32  bone]
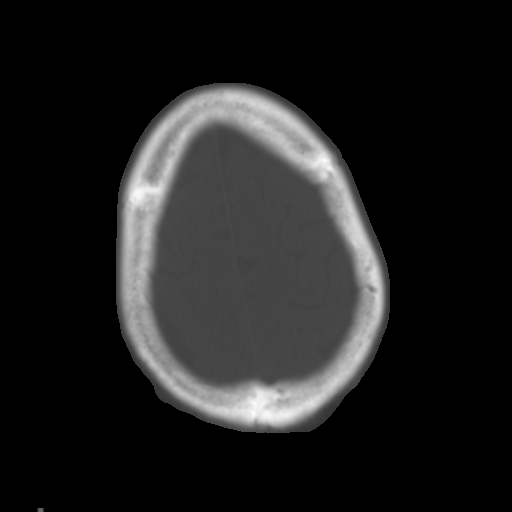
[im 29/32  brain]
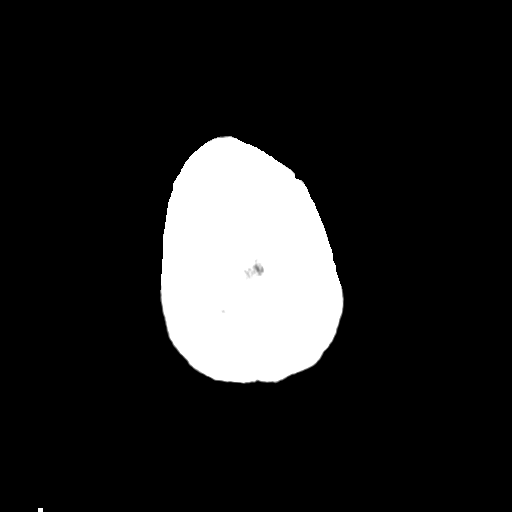

[Series 4: coronal soft · coronal · 0.34mm/px · 3 of 71 slices shown]
[im 24/71  brain]
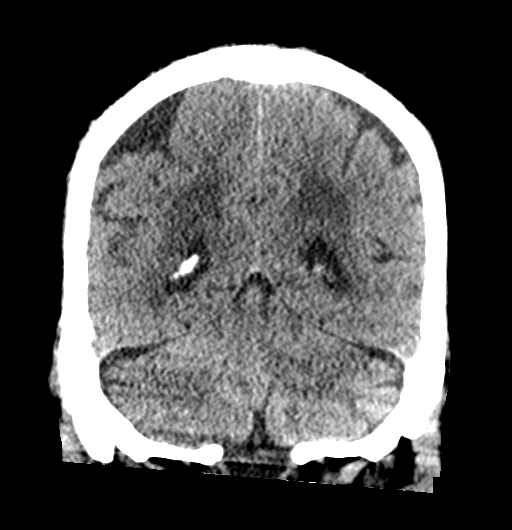
[im 32/71  brain]
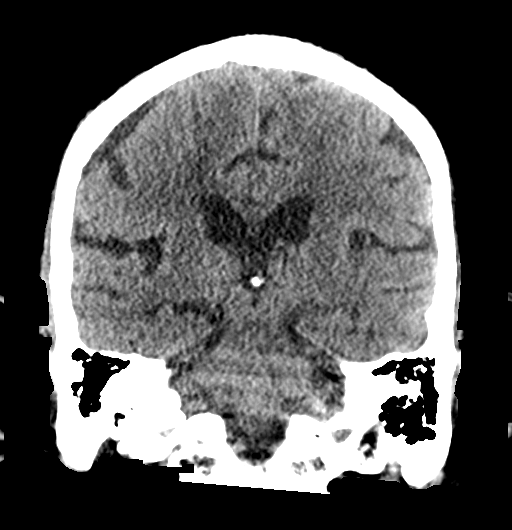
[im 39/71  brain]
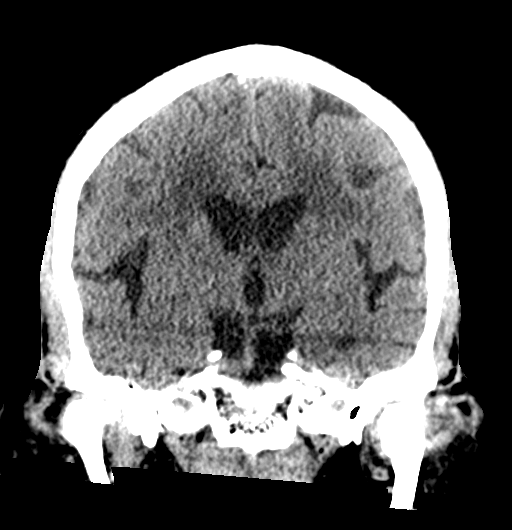

[Series 5: sagittal soft · sagittal · 0.37mm/px · 3 of 57 slices shown]
[im 19/57  brain]
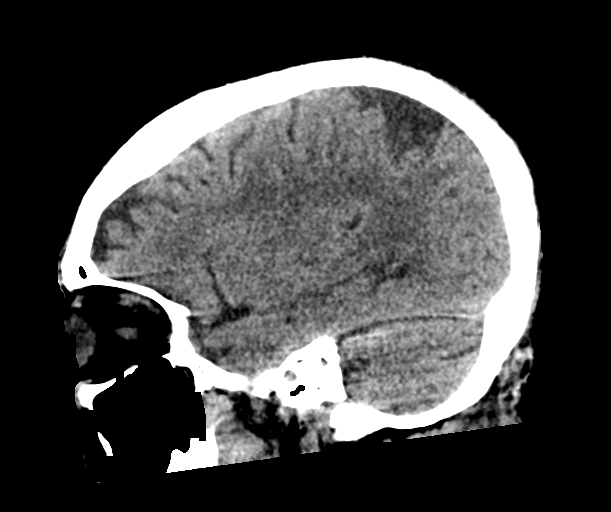
[im 29/57  brain]
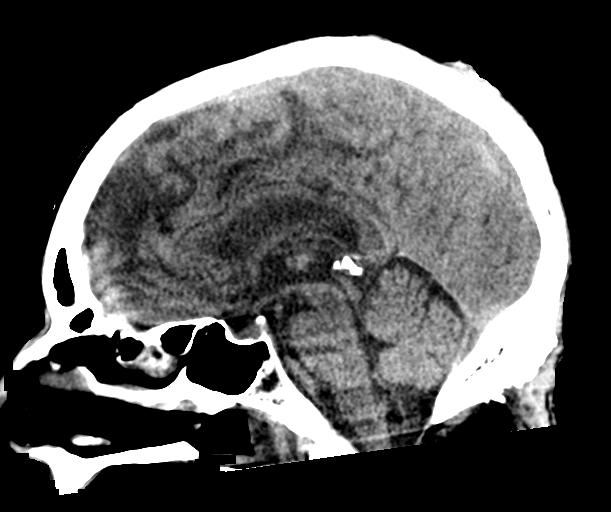
[im 38/57  brain]
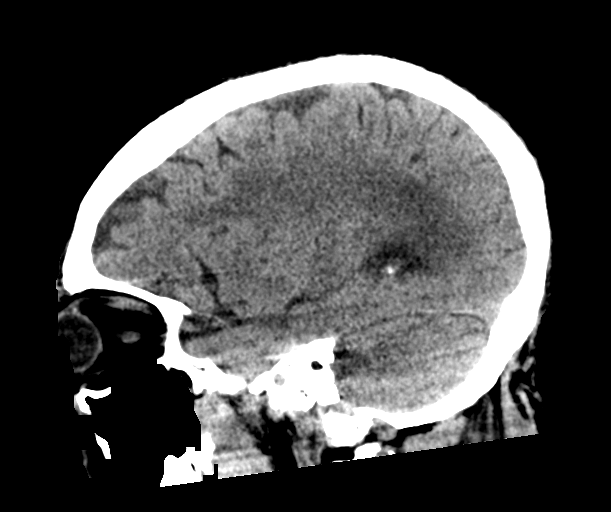

[16 of 47 positions shown; findings below may reference images not displayed]

FINDINGS: Brain: Age related atrophy. Moderate chronic small vessel ischemia.
Remote lacunar infarcts in the anterior limb of the left internal
capsule. No intracranial hemorrhage, mass effect, or midline shift.
No hydrocephalus. The basilar cisterns are patent. No evidence of
territorial infarct or acute ischemia. No extra-axial or
intracranial fluid collection.

Vascular: Atherosclerosis of skullbase vasculature without
hyperdense vessel or abnormal calcification.

Skull: No fracture or focal lesion.

Sinuses/Orbits: Paranasal sinuses and mastoid air cells are clear.
The visualized orbits are unremarkable.

Other: None.
IMPRESSION: 1. No acute intracranial abnormality.
2. Age related atrophy and chronic small vessel ischemia. Remote
lacunar infarcts in the anterior limb of the left internal capsule.
